# Patient Record
Sex: Female | Born: 1960
Health system: Southern US, Community
[De-identification: ages and names within clinical notes are randomized; demographics above are authoritative.]

## PROBLEM LIST (undated history)

## (undated) DIAGNOSIS — M1990 Unspecified osteoarthritis, unspecified site: Secondary | ICD-10-CM

## (undated) DIAGNOSIS — Z310 Encounter for reversal of previous sterilization: Secondary | ICD-10-CM

## (undated) DIAGNOSIS — M722 Plantar fascial fibromatosis: Secondary | ICD-10-CM

## (undated) DIAGNOSIS — G4730 Sleep apnea, unspecified: Secondary | ICD-10-CM

## (undated) DIAGNOSIS — K219 Gastro-esophageal reflux disease without esophagitis: Secondary | ICD-10-CM

## (undated) DIAGNOSIS — M797 Fibromyalgia: Secondary | ICD-10-CM

## (undated) DIAGNOSIS — R0602 Shortness of breath: Secondary | ICD-10-CM

## (undated) DIAGNOSIS — M7501 Adhesive capsulitis of right shoulder: Secondary | ICD-10-CM

## (undated) DIAGNOSIS — S83206A Unspecified tear of unspecified meniscus, current injury, right knee, initial encounter: Secondary | ICD-10-CM

## (undated) DIAGNOSIS — S83207A Unspecified tear of unspecified meniscus, current injury, left knee, initial encounter: Secondary | ICD-10-CM

## (undated) HISTORY — PX: CHOLECYSTECTOMY: SHX55

## (undated) HISTORY — DX: Unspecified tear of unspecified meniscus, current injury, right knee, initial encounter: S83.206A

## (undated) HISTORY — PX: TUBAL LIGATION: SHX77

## (undated) HISTORY — PX: FOOT TENDON SURGERY: SHX958

## (undated) HISTORY — DX: Adhesive capsulitis of right shoulder: M75.01

## (undated) HISTORY — DX: Plantar fascial fibromatosis: M72.2

## (undated) HISTORY — DX: Unspecified tear of unspecified meniscus, current injury, left knee, initial encounter: S83.207A

## (undated) HISTORY — PX: MENISCUS REPAIR: SHX5179

## (undated) HISTORY — PX: ROTATOR CUFF REPAIR: SHX139

## (undated) HISTORY — PX: TONSILLECTOMY: SUR1361

## (undated) HISTORY — DX: Encounter for reversal of previous sterilization: Z31.0

## (undated) HISTORY — PX: CARPAL TUNNEL RELEASE: SHX101

---

## 1998-07-16 DIAGNOSIS — Z310 Encounter for reversal of previous sterilization: Secondary | ICD-10-CM

## 1998-07-16 HISTORY — DX: Encounter for reversal of previous sterilization: Z31.0

## 1999-02-22 ENCOUNTER — Ambulatory Visit (HOSPITAL_BASED_OUTPATIENT_CLINIC_OR_DEPARTMENT_OTHER): Admission: RE | Admit: 1999-02-22 | Discharge: 1999-02-22 | Payer: Self-pay | Admitting: Orthopedic Surgery

## 2001-09-03 ENCOUNTER — Emergency Department (HOSPITAL_COMMUNITY): Admission: EM | Admit: 2001-09-03 | Discharge: 2001-09-03 | Payer: Self-pay | Admitting: Emergency Medicine

## 2005-02-21 ENCOUNTER — Ambulatory Visit: Payer: Self-pay | Admitting: Internal Medicine

## 2005-02-24 ENCOUNTER — Ambulatory Visit (HOSPITAL_COMMUNITY): Admission: RE | Admit: 2005-02-24 | Discharge: 2005-02-24 | Payer: Self-pay | Admitting: Internal Medicine

## 2006-08-14 ENCOUNTER — Ambulatory Visit (HOSPITAL_COMMUNITY): Admission: RE | Admit: 2006-08-14 | Discharge: 2006-08-14 | Payer: Self-pay | Admitting: Hospitalist

## 2006-08-14 ENCOUNTER — Encounter (INDEPENDENT_AMBULATORY_CARE_PROVIDER_SITE_OTHER): Payer: Self-pay | Admitting: Hospitalist

## 2006-08-16 ENCOUNTER — Encounter: Payer: Self-pay | Admitting: Internal Medicine

## 2006-08-16 ENCOUNTER — Ambulatory Visit (HOSPITAL_COMMUNITY): Admission: RE | Admit: 2006-08-16 | Discharge: 2006-08-16 | Payer: Self-pay | Admitting: Internal Medicine

## 2006-08-16 ENCOUNTER — Ambulatory Visit: Payer: Self-pay | Admitting: Internal Medicine

## 2006-09-03 ENCOUNTER — Ambulatory Visit (HOSPITAL_COMMUNITY): Admission: RE | Admit: 2006-09-03 | Discharge: 2006-09-03 | Payer: Self-pay | Admitting: Internal Medicine

## 2006-09-04 DIAGNOSIS — Z9089 Acquired absence of other organs: Secondary | ICD-10-CM | POA: Insufficient documentation

## 2006-09-04 DIAGNOSIS — M26609 Unspecified temporomandibular joint disorder, unspecified side: Secondary | ICD-10-CM | POA: Insufficient documentation

## 2006-09-04 DIAGNOSIS — IMO0002 Reserved for concepts with insufficient information to code with codable children: Secondary | ICD-10-CM | POA: Insufficient documentation

## 2006-09-04 DIAGNOSIS — M7500 Adhesive capsulitis of unspecified shoulder: Secondary | ICD-10-CM | POA: Insufficient documentation

## 2006-09-06 ENCOUNTER — Ambulatory Visit: Payer: Self-pay | Admitting: Internal Medicine

## 2006-09-06 DIAGNOSIS — N63 Unspecified lump in unspecified breast: Secondary | ICD-10-CM | POA: Insufficient documentation

## 2006-09-06 DIAGNOSIS — M722 Plantar fascial fibromatosis: Secondary | ICD-10-CM | POA: Insufficient documentation

## 2006-09-09 DIAGNOSIS — G4733 Obstructive sleep apnea (adult) (pediatric): Secondary | ICD-10-CM | POA: Insufficient documentation

## 2006-09-20 ENCOUNTER — Encounter: Admission: RE | Admit: 2006-09-20 | Discharge: 2006-09-20 | Payer: Self-pay | Admitting: Internal Medicine

## 2006-09-20 ENCOUNTER — Encounter (INDEPENDENT_AMBULATORY_CARE_PROVIDER_SITE_OTHER): Payer: Self-pay | Admitting: Internal Medicine

## 2006-10-16 ENCOUNTER — Ambulatory Visit: Payer: Self-pay | Admitting: Hospitalist

## 2006-10-29 ENCOUNTER — Ambulatory Visit: Payer: Self-pay | Admitting: Hospitalist

## 2006-11-12 ENCOUNTER — Telehealth: Payer: Self-pay | Admitting: *Deleted

## 2006-11-13 ENCOUNTER — Ambulatory Visit: Payer: Self-pay | Admitting: *Deleted

## 2006-11-13 ENCOUNTER — Encounter: Payer: Self-pay | Admitting: Licensed Clinical Social Worker

## 2006-12-05 ENCOUNTER — Encounter (INDEPENDENT_AMBULATORY_CARE_PROVIDER_SITE_OTHER): Payer: Self-pay | Admitting: Internal Medicine

## 2006-12-05 ENCOUNTER — Ambulatory Visit (HOSPITAL_BASED_OUTPATIENT_CLINIC_OR_DEPARTMENT_OTHER): Admission: RE | Admit: 2006-12-05 | Discharge: 2006-12-05 | Payer: Self-pay | Admitting: Internal Medicine

## 2006-12-09 ENCOUNTER — Ambulatory Visit: Payer: Self-pay | Admitting: Internal Medicine

## 2007-03-04 ENCOUNTER — Ambulatory Visit: Payer: Self-pay | Admitting: Internal Medicine

## 2007-03-06 ENCOUNTER — Ambulatory Visit (HOSPITAL_COMMUNITY): Admission: RE | Admit: 2007-03-06 | Discharge: 2007-03-06 | Payer: Self-pay | Admitting: *Deleted

## 2007-03-19 ENCOUNTER — Encounter: Admission: RE | Admit: 2007-03-19 | Discharge: 2007-04-24 | Payer: Self-pay | Admitting: *Deleted

## 2007-03-26 ENCOUNTER — Ambulatory Visit: Payer: Self-pay | Admitting: Internal Medicine

## 2007-03-26 ENCOUNTER — Encounter (INDEPENDENT_AMBULATORY_CARE_PROVIDER_SITE_OTHER): Payer: Self-pay | Admitting: *Deleted

## 2007-04-24 ENCOUNTER — Encounter: Payer: Self-pay | Admitting: Internal Medicine

## 2008-05-08 ENCOUNTER — Emergency Department (HOSPITAL_COMMUNITY): Admission: EM | Admit: 2008-05-08 | Discharge: 2008-05-08 | Payer: Self-pay | Admitting: Emergency Medicine

## 2008-11-07 IMAGING — CR DG WRIST COMPLETE 3+V*L*
4 series · 4 of 4 positions shown · non-contrast
Comparison: none

CLINICAL DATA: 45 year-old with left wrist pain.
 LEFT WRIST ? 4 VIEW:
 There is no evidence of fracture or dislocation.  There is no evidence of arthropathy or other focal bone abnormality.  Soft tissues are unremarkable.

[x wrist pa left]
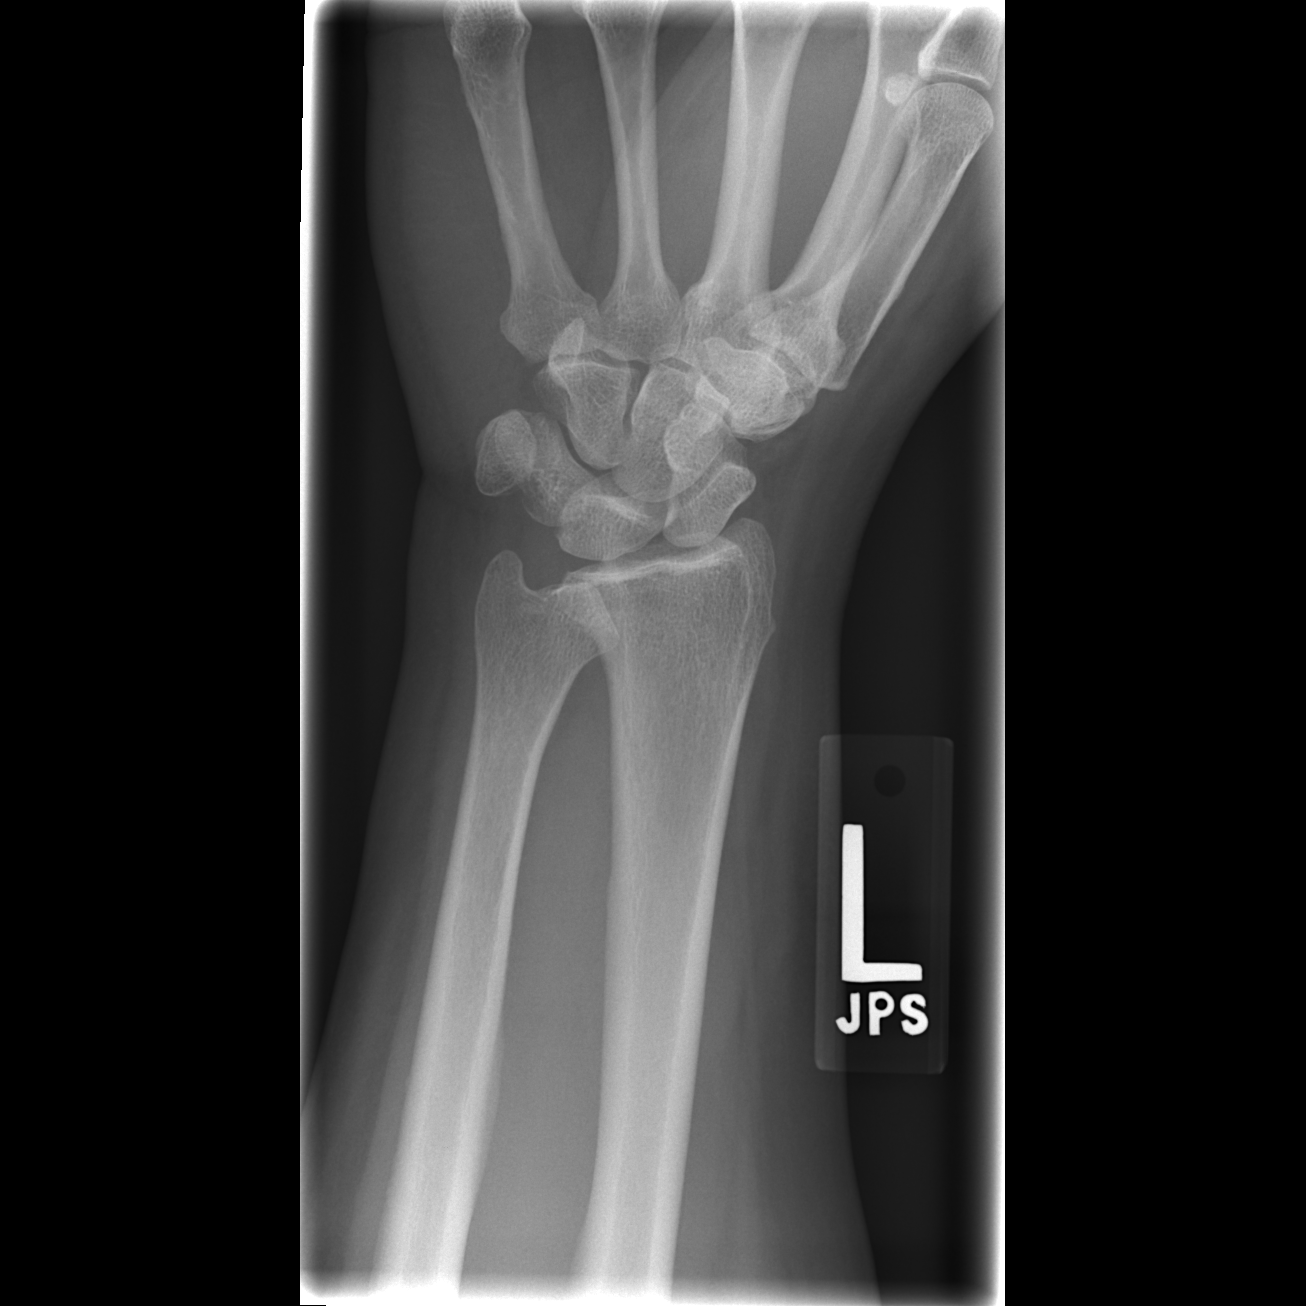

[x wrist obl left]
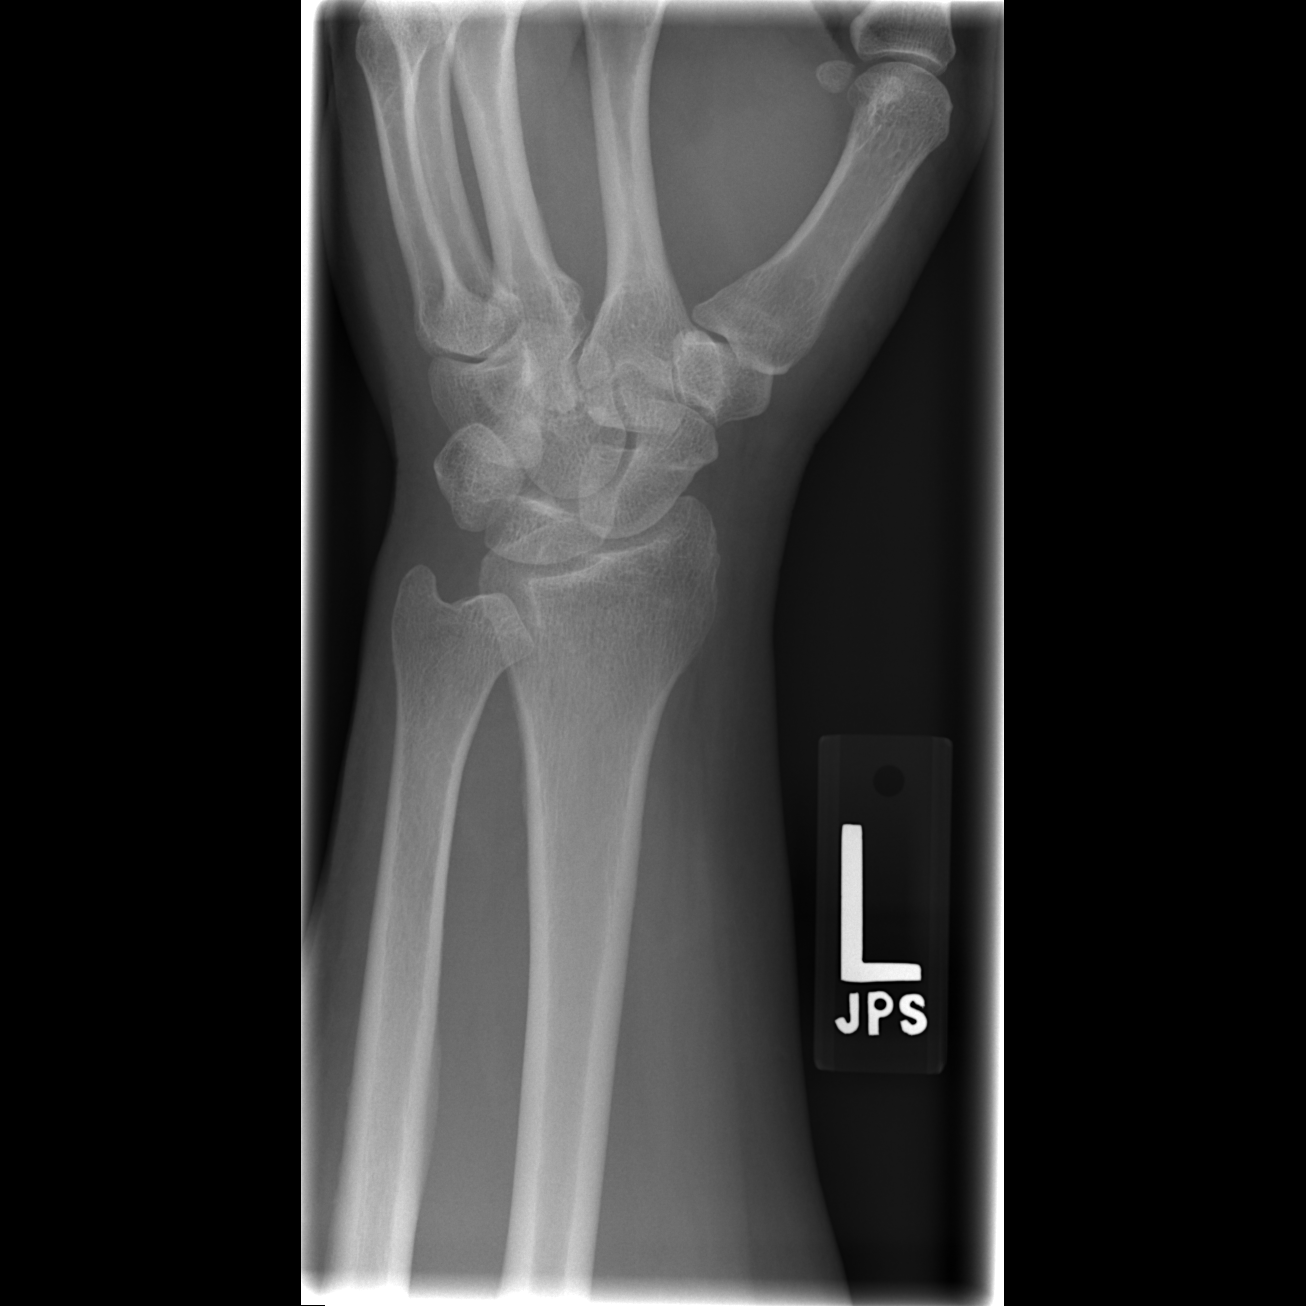

[x wrist lat left]
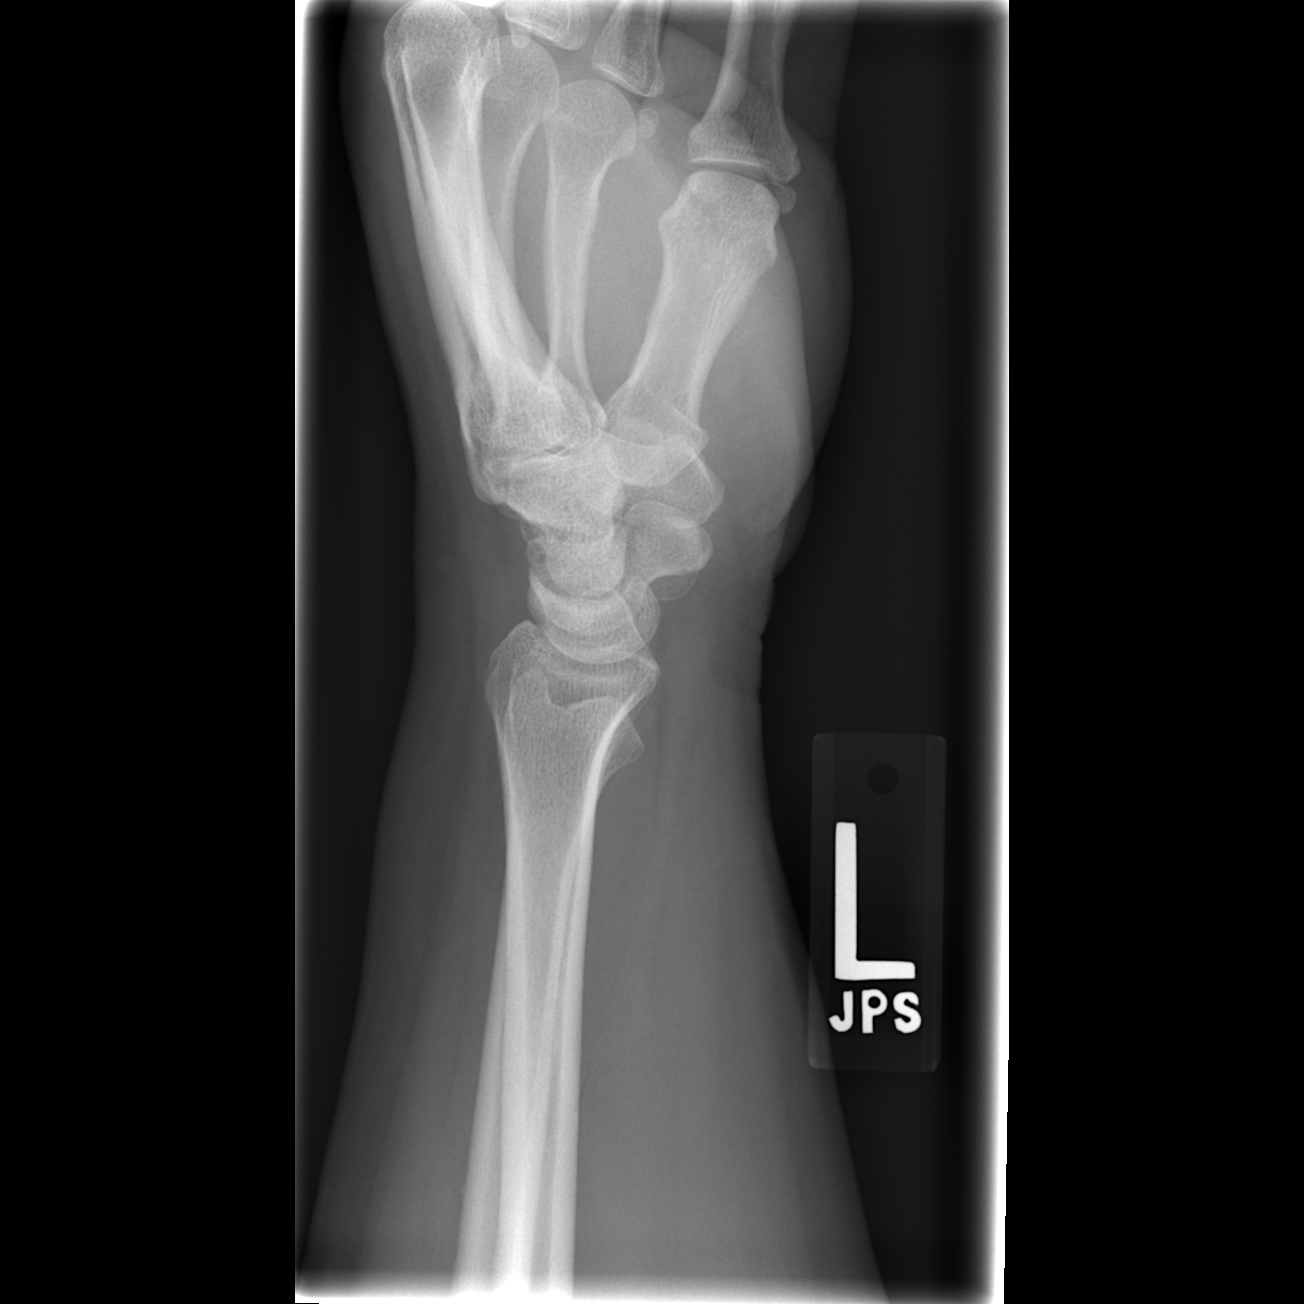

[x navicular]
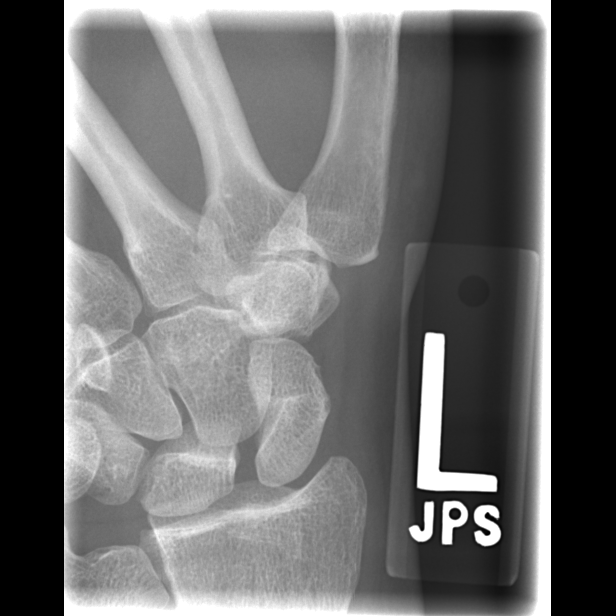

[4 of 4 positions shown; findings below may reference images not displayed]

IMPRESSION: Negative.

## 2009-08-29 ENCOUNTER — Ambulatory Visit: Payer: Self-pay | Admitting: Internal Medicine

## 2009-08-31 ENCOUNTER — Ambulatory Visit: Payer: Self-pay | Admitting: Sports Medicine

## 2009-08-31 DIAGNOSIS — M79609 Pain in unspecified limb: Secondary | ICD-10-CM | POA: Insufficient documentation

## 2009-09-02 ENCOUNTER — Ambulatory Visit: Payer: Self-pay | Admitting: Internal Medicine

## 2009-09-02 LAB — CONVERTED CEMR LAB
ALT: 30 units/L (ref 0–35)
AST: 27 units/L (ref 0–37)
Albumin: 3.9 g/dL (ref 3.5–5.2)
Alkaline Phosphatase: 93 units/L (ref 39–117)
BUN: 9 mg/dL (ref 6–23)
CO2: 22 meq/L (ref 19–32)
Calcium: 8.8 mg/dL (ref 8.4–10.5)
Chloride: 103 meq/L (ref 96–112)
Cholesterol: 185 mg/dL (ref 0–200)
Creatinine, Ser: 0.57 mg/dL (ref 0.40–1.20)
Glucose, Bld: 188 mg/dL — ABNORMAL HIGH (ref 70–99)
HDL: 47 mg/dL (ref >39)
LDL Cholesterol: 106 mg/dL — ABNORMAL HIGH (ref 0–99)
Potassium: 4 meq/L (ref 3.5–5.3)
Sodium: 136 meq/L (ref 135–145)
TSH: 0.985 microintl units/mL (ref 0.350–4.5)
Total Bilirubin: 0.6 mg/dL (ref 0.3–1.2)
Total CHOL/HDL Ratio: 3.9
Total Protein: 6.6 g/dL (ref 6.0–8.3)
Triglycerides: 161 mg/dL — ABNORMAL HIGH (ref <150)
VLDL: 32 mg/dL (ref 0–40)

## 2009-09-05 ENCOUNTER — Ambulatory Visit (HOSPITAL_COMMUNITY): Admission: RE | Admit: 2009-09-05 | Discharge: 2009-09-05 | Payer: Self-pay | Admitting: Sports Medicine

## 2009-09-06 ENCOUNTER — Ambulatory Visit (HOSPITAL_COMMUNITY): Admission: RE | Admit: 2009-09-06 | Discharge: 2009-09-06 | Payer: Self-pay | Admitting: Internal Medicine

## 2009-09-06 LAB — HM MAMMOGRAPHY: HM Mammogram: NEGATIVE

## 2009-09-12 ENCOUNTER — Telehealth: Payer: Self-pay | Admitting: *Deleted

## 2009-09-21 ENCOUNTER — Ambulatory Visit: Payer: Self-pay | Admitting: Sports Medicine

## 2009-10-11 ENCOUNTER — Ambulatory Visit: Payer: Self-pay | Admitting: Infectious Diseases

## 2009-10-19 ENCOUNTER — Ambulatory Visit: Payer: Self-pay | Admitting: Sports Medicine

## 2009-10-19 DIAGNOSIS — M65849 Other synovitis and tenosynovitis, unspecified hand: Secondary | ICD-10-CM

## 2009-10-19 DIAGNOSIS — M65839 Other synovitis and tenosynovitis, unspecified forearm: Secondary | ICD-10-CM | POA: Insufficient documentation

## 2009-10-19 DIAGNOSIS — M720 Palmar fascial fibromatosis [Dupuytren]: Secondary | ICD-10-CM | POA: Insufficient documentation

## 2009-11-30 ENCOUNTER — Ambulatory Visit: Payer: Self-pay | Admitting: Sports Medicine

## 2010-01-04 ENCOUNTER — Ambulatory Visit: Payer: Self-pay | Admitting: Sports Medicine

## 2010-07-24 ENCOUNTER — Ambulatory Visit: Admit: 2010-07-24 | Payer: Self-pay

## 2010-07-26 ENCOUNTER — Ambulatory Visit: Admission: RE | Admit: 2010-07-26 | Discharge: 2010-07-26 | Payer: Self-pay | Source: Home / Self Care

## 2010-07-26 DIAGNOSIS — E119 Type 2 diabetes mellitus without complications: Secondary | ICD-10-CM | POA: Insufficient documentation

## 2010-07-26 DIAGNOSIS — E11319 Type 2 diabetes mellitus with unspecified diabetic retinopathy without macular edema: Secondary | ICD-10-CM | POA: Insufficient documentation

## 2010-07-26 DIAGNOSIS — Z794 Long term (current) use of insulin: Secondary | ICD-10-CM | POA: Insufficient documentation

## 2010-07-26 LAB — CONVERTED CEMR LAB: Hgb A1c MFr Bld: 9 %

## 2010-07-27 DIAGNOSIS — E785 Hyperlipidemia, unspecified: Secondary | ICD-10-CM | POA: Insufficient documentation

## 2010-07-27 LAB — CONVERTED CEMR LAB
ALT: 28 units/L (ref 0–35)
AST: 27 units/L (ref 0–37)
Albumin: 4.1 g/dL (ref 3.5–5.2)
Alkaline Phosphatase: 102 units/L (ref 39–117)
BUN: 9 mg/dL (ref 6–23)
CO2: 26 meq/L (ref 19–32)
Calcium: 9.5 mg/dL (ref 8.4–10.5)
Chloride: 102 meq/L (ref 96–112)
Cholesterol: 238 mg/dL — ABNORMAL HIGH (ref 0–200)
Creatinine, Ser: 0.63 mg/dL (ref 0.40–1.20)
Glucose, Bld: 235 mg/dL — ABNORMAL HIGH (ref 70–99)
HDL: 57 mg/dL (ref >39)
LDL Cholesterol: 152 mg/dL — ABNORMAL HIGH (ref 0–99)
Potassium: 4.3 meq/L (ref 3.5–5.3)
Sodium: 137 meq/L (ref 135–145)
Total Bilirubin: 0.5 mg/dL (ref 0.3–1.2)
Total CHOL/HDL Ratio: 4.2
Total Protein: 6.9 g/dL (ref 6.0–8.3)
Triglycerides: 146 mg/dL (ref <150)
VLDL: 29 mg/dL (ref 0–40)

## 2010-08-06 ENCOUNTER — Encounter: Payer: Self-pay | Admitting: *Deleted

## 2010-08-17 NOTE — Assessment & Plan Note (Signed)
Summary: FEET/ SB.   Vital Signs:  Patient Profile:   50 Years Old Female Height:     62 inches (157.48 cm) Weight:      230.6 pounds (104.82 kg) BMI:     42.33 Temp:     97.0 degrees F (36.11 degrees C) oral Pulse rate:   74 / minute BP sitting:   127 / 86  (right arm)  Pt. in pain?   yes    Location:   right foot    Intensity:   8  Vitals Entered By: Marisa Kidney Ditzler RN (March 04, 2007 3:51 PM)              Is Patient Diabetic? No Nutritional Status BMI of > 30 = obese Nutritional Status Detail ok  Have you ever been in a relationship where you felt threatened, hurt or afraid?denies   Does patient need assistance? Functional Status Self care Ambulation Normal   PCP:  Ramiro Harvest, MD  Chief Complaint:  Problems with right foot again and left hip. Discuss sleep study. LMP 01/14/07- hot flashes  and night sweats and not sleeping at night..  History of Present Illness: Pt is a 50 yo obese woman with a hx of plantar fasciitis, capsulitis and recently dx'd OSA who comes to the opc because she's having R foot pain. Pt never had f/u on PSG and wants to know what the report showed. Has a heel spur and plantar fasciitis and [name] had mentioned he might refer her elsewhere. Her pain started 6-12 months ago and came back 5 days ago. Still does exercises (tarsal stretching). Has been taking about 16 tabs of 200 mg ibuprofen and has been having an upset stomach because of that. Constantly throbbing foot. Limping, can't tolerate walking for long periods anymore. Feels like her foot is becoming swollen again. Stabbing pain when she gets off the bed.  Has no choice but to help her husband; he's wheelchair bound. [name] had suggested she stay off her foot for a year.  Pt has lost about 10 lbs since her dx.  Pt bought otc inserts that helped some but explains that they tend to give her L lumbar pain. Had meniscectomy in her L knee years ago and feels that why she's having a hard  time correcting her gait.  Current Allergies (reviewed today): ! FLOXIN OTIC  Past Medical History:    Right adhesive capsulitis with underlying tendinopathy rotator cuff    History of tuboplasty 4-6 years ago- 2000    Left meniscus tear x2 status post surgery.    Right meniscus tear x1 status post surgery.    TMJ status post surgery.    Status post cholecystectomy x14 years ago in [place]    R plantar fasciitis   Social History:    Occupation: Works Librarian, academic    Married. Husband disabled and wheelchair bound.   Risk Factors: Tobacco use:  never Passive smoke exposure:  no Drug use:  no HIV high-risk behavior:  no Alcohol use:  no  Family History Risk Factors:    Family History of MI in females < 44 years old:  no    Family History of MI in males < 35 years old:  no   Review of Systems      See HPI  GI      Denies abdominal pain, bloody stools, dark tarry stools, nausea, and vomiting.   Physical Exam  General:     alert, well-developed, well-nourished, and  well-hydrated.  Obese pleasant Latin woman. Head:     atraumatic.   Eyes:     vision grossly intact, pupils equal, pupils round, and pupils reactive to light.   Mouth:     OP clear, MMM. Neck:     Supple, no lymphadnp/tm Lungs:     CTAB with good air mvt. Heart:     RRR, no m/r/g Pulses:     2+ pedal pulses bilaterally. Extremities:     Trace edema up to both knees. No cyanosis or clubbing. R foot tender at plantar fascia insertion. Palpable indurated and thickened plantar fascia bilaterally but most significantly on R side. Neurologic:     alert & oriented X3, cranial nerves II-XII intact, strength normal in all extremities, sensation intact to light touch, and DTRs symmetrical and normal.  Antalgic gait.    Impression & Recommendations:  Problem # 1:  SLEEP APNEA, OBSTRUCTIVE (ICD-327.23) Pt had a PSG done which revealed moderate OSA. They were unable to titrate her on CPAP because she is  unable to sleep on her sides (only sleeps on her stomach). Explained situation to patient. Wanted to give it another try; she'll do her best to sleep on her side. On the PSG report, it was also mentioned how "other" therapies could also be addressed during an eventual f/u. Pt interested in meeting with pulmonologist again. Otherwise, pt still symptomatic in terms of waking up frequently.  Orders: Pulmonary Referral (Pulmonary)   Problem # 2:  PLANTAR FASCIITIS, RIGHT (ICD-728.71) I do agree with the dx of plantar fasciitis but sx rarely persist more than 12 months. I have printed all the different exercises patient can do from the [employer] chapter on the topic. I have also provided her with a description on how to perform foot taping. Since she has apparently failed antiinflammatory tx, I will get an MRI of her foot to confirm the dx and further evaluate the palpable thickening overlying the fascia. I will also refer her to PT to make sure that she at least does her exercises a few times a week. I will also try an orthopedics referral given that she hasn't responded that well to conservative tx. Pt not willing to other steroid injections today because it hurt her so much last time she had it done.  Orders: Orthopedic Referral (Ortho) Physical Therapy Referral (PT) MRI (MRI)  Her updated medication list for this problem includes:    Ibuprofen 800 Mg Tabs (Ibuprofen) .Marland Kitchen... Take 1 tablet by mouth three times a day   Complete Medication List: 1)  Darvocet-n 100 Tabs (Propoxyphene n-apap tabs) .Marland Kitchen.. 1 tablet every 4 hours as needed for pain 2)  Ibuprofen 800 Mg Tabs (Ibuprofen) .... Take 1 tablet by mouth three times a day   Patient Instructions: 1)  We have referred you to orthopedics; they will call you to set up an apptmt. 2)  We have referred you back to a pulmonologist to see what can be done about your sleep apnea. 3)  Get inserts for your shoes. 4)  Do the foot exercises I showed you  everyday.    Prescriptions: DARVOCET-N 100  TABS (PROPOXYPHENE N-APAP TABS) 1 tablet every 4 hours as needed for pain  #120 x 1   Entered and Authorized by:   Olene Craven MD   Signed by:   Olene Craven MD on 03/04/2007   Method used:   Print then Give to Patient   RxID:   [id number] IBUPROFEN 800 MG  TABS (IBUPROFEN)  Take 1 tablet by mouth three times a day  #90 x 1   Entered and Authorized by:   Olene Craven MD   Signed by:   Olene Craven MD on 03/04/2007   Method used:   Print then Give to Patient   RxID:   [id number]

## 2010-08-17 NOTE — Assessment & Plan Note (Signed)
Summary: FU WRIST PAIN/VS   Vital Signs:  Patient Profile:   50 Years Old Female Weight:      240.9 pounds Temp:     97.7 degrees F oral Pulse rate:   67 / minute Resp:     20 per minute BP sitting:   126 / 82  (right arm)  Pt. in pain?   yes    Location:   L wrist    Intensity:   6    Type:       aching  Vitals Entered By: Ballard Russell RN (August 16, 2006 2:38 PM)              Is Patient Diabetic? No Nutritional Status Obese  Have you ever been in a relationship where you felt threatened, hurt or afraid?No   Does patient need assistance? Functional Status Self care Ambulation Normal      Chief Complaint:  Folow up pain L wrist.  History of Present Illness: [name]  comes today to our clinic complaining of left wrist pain. 3 weeks ago a hammer fell on her wrist and since then she has been havig pain on and off in that area. She had an X-ray yesterday that did not show fracture. She also would like to get her PAP smear done and be schedule for mammogram. Finally she has had a painful knot on her plantar surface of right foot for months that doesn't go away.    Prior Medications:    Social History:    Occupation: Works Banker wall    Married   Risk Factors:  Tobacco use:  never   Review of Systems  The patient denies fever and chills.     Physical Exam  General:     alert and overweight-appearing.   Head:     normocephalic and atraumatic.   Breasts:     questionable L breast mass free from deep and superficial tissues. No adenopathy. Right breast normal Lungs:     normal respiratory effort and normal breath sounds.   Heart:     normal rate, regular rhythm, no murmur, no gallop, and no rub.   Genitalia:     Normal introitus for age, no external lesions, no vaginal discharge, mucosa pink and moist, no vaginal or cervical lesions, no vaginal atrophy, no friaility or hemorrhage, normal uterus size and position, no adnexal masses or  tenderness Msk:     There is normal left wrist ROM and minimal tenderness over radial aspect. No edema and no inflammtory signs.  Right foot indeed has tenderness along the medial aspect of plantar surface where a bulging is palpated that could represent hypertrophic fascia or calcified tendon.    Impression & Recommendations:  Problem # 1:  CONTUSION, WRIST (ICD-923.21) She is 3 weeks off the incident. X-ray was negative for fracture.  Plan: Ibuprofen 600 mg. by mouth three times a day for 7 days         Come back to clinic if pain persists after 2 weeks.  Problem # 2:  FOOT PAIN, RIGHT (ICD-729.5) At this point it seems to me this is plantar fasciitis with hypertrophic changes or calcified tendon.   Plan: X-ray, Ibuprofen, Gel pads, exercises as explained to patient.          F/U 2-3 weeks. Orders: Diagnostic X-Ray/Fluoroscopy (Diagnostic X-Ray/Flu)   Problem # 3:  PREVENTIVE HEALTH CARE (ICD-V70.0) I did pelvic exam and PAP. Orders: Mammogram (Mammogram) T-Pap Smear, Thin Prep (  U9811)   Medications Added to Medication List This Visit: 1)  Ibuprofen 200 Mg Tabs (Ibuprofen) .... Take 3 tablets by mouth three times a day for 7 days   Patient Instructions: 1)  Please schedule a follow-up appointment in 2-3 weeks.

## 2010-08-17 NOTE — Assessment & Plan Note (Signed)
Summary: F/U,MC   Vital Signs:  Patient profile:   50 year old female BP sitting:   113 / 74  Vitals Entered By: Lillia Pauls CMA (October 19, 2009 3:04 PM)  Primary Provider:  Mariea Stable MD   History of Present Illness: Bilateral plantar pain 75% improved on modified comforthotics. Flared 5 days ago 2/2 increased walking required to transport family members to medical appointments. Pain decreasing in the interim Took old opioid tablets for pain relief. No adverse medication effects. Typically pain caused by ambulation and relieved by rest. No paresthesias.  Notes intermittent pain on extensor aspect of right index finger. Some occasional swelling. No paresthesias.  No trauma or pevious procedures.   Notes intermittent pain and catching along mid-distral palmar aspect of right hand. No finger triggering. Occasional palmar pain. No palmar swelling. No paresthesias. No previous trauma or procedures.   Borderilne DM. No thyroid disease.  Allergies: 1)  ! Floxin Otic PMH-FH-SH reviewed for relevance  Physical Exam  General:  Well-developed,well-nourished,in no acute distress; alert,appropriate and cooperative throughout examination Msk:  Decreased size and ttp of bilateral plantar fibromatoses. Otherwise unchanged exam.  Exmaination of right hand revealed extensor indicis pain on palpation, more pronounced on resisted index extension. No swelling/discoloration. Function intact.  Right palmar exam revealed pitting about the 3rd/4th A1 pulleys (c/w Depyutren's Disease); with mild dyscomfort on palpation. No triggering. No skin discoloration or swelling.  Full ROM/strength wrt right wrist/hand/fingers.  Normal nv examination throughout UEs.     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 States she is significantly limited by finance. Thus  she opts to forego surgical consult at this point. Declined an aspiration/injection procedure today.  - Increased the depth of the doughnut depression on current felt inserts during this encounter. - Refilled diclofenac. - Start tramadol with sedation precautions. - DC opioid. Discard opioid rx given it is  ~49 yrs old.  - RTC in 6 weeks.  Her updated medication list for this problem includes:    Diclofenac Sodium 75 Mg Tbec (Diclofenac sodium) .Marland Kitchen... 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  - Will start with hand/finger stretches (demonstrated to patient) under warm water daily. - Diclofenac refilled. - RTC in 6 wks. Will consider alternative interventions based on clinical picture and patient desires.  Problem # 3:  OTHER TENOSYNOVITIS OF HAND AND WRIST (ICD-727.05) Extensor Tendonitis  - Diclofenac refilled. - Daily wrist/hand/finger stretching and strengthening exercises (demonstrated to patient) under warm water as tolerated. - RTC in 4 weeks.  Complete Medication List: 1)  Diclofenac Sodium 75 Mg Tbec (Diclofenac sodium) .Marland Kitchen.. 1 tab by mouth with food bid 2)  Tramadol Hcl 50 Mg Tabs (Tramadol hcl) .Marland Kitchen.. 1-2 tabs by mouth qhs as needed severe pain Prescriptions: DICLOFENAC SODIUM 75 MG TBEC (DICLOFENAC SODIUM) 1 tab by mouth with food BID  #60 x 0   Entered and Authorized by:   Valarie Merino MD   Signed by:   Valarie Merino MD on 10/19/2009   Method used:   Print then Give to Patient   RxID:   [id number] DICLOFENAC SODIUM 75 MG TBEC (DICLOFENAC SODIUM) 1 tab by mouth with food BID  #60 x 0   Entered and Authorized by:   Valarie Merino MD   Signed by:   Valarie Merino MD  on 10/19/2009   Method used:   Print then Give to Patient   RxID:   [id number] TRAMADOL HCL 50 MG TABS (TRAMADOL HCL) 1-2 tabs by mouth qHS as needed severe pain  #60 x 0    Entered and Authorized by:   Valarie Merino MD   Signed by:   Valarie Merino MD on 10/19/2009   Method used:   Print then Give to Patient   RxID:   [id number] TRAMADOL HCL 50 MG TABS (TRAMADOL HCL) 1-2 tabs by mouth qHS as needed severe pain  #60 x 0   Entered and Authorized by:   Valarie Merino MD   Signed by:   Valarie Merino MD on 10/19/2009   Method used:   Print then Give to Patient   RxID:   [id number]

## 2010-08-17 NOTE — Assessment & Plan Note (Signed)
Summary: F/U,MC   Vital Signs:  Patient profile:   50 year old female BP sitting:   130 / 85  Vitals Entered By: Lillia Pauls CMA (Nov 30, 2009 9:23 AM)  Primary Provider:  Mariea Stable MD   History of Present Illness: DOI: N/A  Reports to address left plantar fibromatosis. Problem started several months ago. Pain and size of nodule initially decreased but patient has noted increased nodular size and pain in the setting of increased ambulatory activity (Caring for disabled family members) since her LOV. Pain worst on  palpation and ambulation. Pain decreased on  rest. Ran out of tramadol and mobic which were helping to control her pain. Other ROS  negative for paresthesias. Has not seen a surgeon 2/2 limited finances and lack of insure coverage. Last had fenestration and corticosteroid injection more than one year ago; minimal relief. She has some reservations about the technique applied during this past procedure. Would like to try repeat fenestration and corticosteroid injection today.   Routine Activities: Cares for disabled family members daily. Her son was recently hospicalized 2/2 UTI.   Allergies: 1)  ! Floxin Otic  Physical Exam  General:  Well-developed,well-nourished,in no acute distress; alert,appropriate and cooperative throughout examination Msk:  L FOOT: Very tender, non-erythematous, mildly indurated, dry nodule at mid arch of increased size since her LOV. No increased warmth. No signs of infection. Normal nv exam. FROM thoughout ankle/foot/toes.   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 Ethyl chloride was used to anesthetize the skin. 1 ml of lidocaine w/o epinephrine was injected into the prepped field. Appropriate analgesia was achieved. Fenestrations were introduced  into the plantar nodule w/o complications or difficulty. Then 1ml of kenalog 10mg /ml was injected into the nodule without complications or difficulty. The patient tolerated this procedure well. Normal post-procedure neurovascular examination.  - Mobic and tramadol refilled. - Prescription for plastazote inserts. - Immediately seek MD attention for fever, worsening symptoms, skin discoloration, or any other concerns. Otherwise RTC in 4 weeks.  Her updated medication list for this problem includes:    Diclofenac Sodium 75 Mg Tbec (Diclofenac sodium) .Marland Kitchen... 1 tab by mouth with food bid  Orders: Aspirate/Inject Ganglion Cyst (65784) Kenalog 10 mg inj (J3301)  Complete Medication List: 1)  Diclofenac Sodium 75 Mg Tbec (Diclofenac sodium) .Marland Kitchen.. 1 tab by mouth with food bid 2)  Tramadol Hcl 50 Mg Tabs (Tramadol hcl) .Marland Kitchen.. 1-2 tabs by mouth qhs as needed severe pain 3)  Soft Plastazote Shoe Inserts  .... Dx: plantar fibromatosis (symptomatic).  please dispense two pairs of inserts for use in shoes. 4)  Lortab 5-500 Mg Tabs (Hydrocodone-acetaminophen) .... Take one tab qhs Prescriptions: LORTAB 5-500 MG TABS (HYDROCODONE-ACETAMINOPHEN) take one tab qhs  #5 x 0   Entered by:   Lillia Pauls CMA   Authorized by:   Valarie Merino MD   Signed by:   Lillia Pauls CMA on 11/30/2009   Method used:   Telephoned to ...       [facility]       [address]       [place], [place]  [address]       Ph: [phone]       Fax: [phone]   RxID:   [id number] TRAMADOL HCL 50  MG TABS (TRAMADOL HCL) 1-2 tabs by mouth qHS as needed severe pain  #60 x 1   Entered and Authorized by:   Valarie Merino MD   Signed by:   Valarie Merino MD on 11/30/2009   Method used:   Print then Give to Patient   RxID:   [id number] DICLOFENAC SODIUM 75 MG TBEC (DICLOFENAC SODIUM) 1 tab by mouth with food BID  #60 x 1   Entered and Authorized by:   Valarie Merino MD   Signed by:    Valarie Merino MD on 11/30/2009   Method used:   Print then Give to Patient   RxID:   [id number] TRAMADOL HCL 50 MG TABS (TRAMADOL HCL) 1-2 tabs by mouth qHS as needed severe pain  #60 x 0   Entered and Authorized by:   Valarie Merino MD   Signed by:   Valarie Merino MD on 11/30/2009   Method used:   Print then Give to Patient   RxID:   [id number] DICLOFENAC SODIUM 75 MG TBEC (DICLOFENAC SODIUM) 1 tab by mouth with food BID  #60 x 0   Entered and Authorized by:   Valarie Merino MD   Signed by:   Valarie Merino MD on 11/30/2009   Method used:   Print then Give to Patient   RxID:   [id number] SOFT PLASTAZOTE SHOE INSERTS Dx: Plantar Fibromatosis (Symptomatic).  Please dispense two pairs of inserts for use in shoes.  #2 pairs x 0   Entered and Authorized by:   Valarie Merino MD   Signed by:   Valarie Merino MD on 11/30/2009   Method used:   Print then Give to Patient   RxID:   [id number] SOFT PLASTAZOTE SHOE INSERTS Dx: Plantar Fibromatosis (Symptomatic).  Please dispense two pairs of inserts for use in shoes.  #2 pairs x 0   Entered and Authorized by:   Valarie Merino MD   Signed by:   Valarie Merino MD on 11/30/2009   Method used:   Print then Give to Patient   RxID:   [id number]

## 2010-08-17 NOTE — Assessment & Plan Note (Signed)
Summary: EST-CK/FU/MEDS/CFB   Vital Signs:  Patient profile:   50 year old female Height:      62 inches (157.48 cm) Weight:      229.5 pounds (104.32 kg) BMI:     42.13 Temp:     97.2 degrees F (36.22 degrees C) oral Pulse rate:   78 / minute BP sitting:   116 / 81  (right arm) Cuff size:   regular  Vitals Entered By: Cynda Familia Duncan Dull) (October 11, 2009 4:04 PM) CC: pt here for pap but has noticed some "spotting", also c/o pain/swelling in right hand, some tingling and numbness on occ Is Patient Diabetic? No Pain Assessment Patient in pain? yes     Location: right hand Intensity: 10 Type: sharp Onset of pain  Intermittent x 2wks-now more constant Nutritional Status BMI of > 30 = obese  Have you ever been in a relationship where you felt threatened, hurt or afraid?No   Does patient need assistance? Functional Status Self care Ambulation Normal   Primary Care Provider:  Mariea Stable MD  CC:  pt here for pap but has noticed some "spotting", also c/o pain/swelling in right hand, and some tingling and numbness on occ.  History of Present Illness: [name] is a 50 yo woman with PMH as outlined below.  She is here for pain in right hand.  It is located over thenar/thumb area.  There is a small area of swelling over the 2nd MTP joint.  Ongoing for about 2 weeks.  Initially waxed and waned, now constant over the last week and worsening.  Took a vicodin that she had for tooth extraction and it helped.  She has also tried ibuprofen, creams, ice, etc without help.    Current Medications (verified): 1)  Diclofenac Sodium 75 Mg Tbec (Diclofenac Sodium) .Marland Kitchen.. 1 Tab By Mouth With Food Bid  Allergies: 1)  ! Floxin Otic  Physical Exam  General:  Well appearing, developed, nourished. Obese. NAD. Lungs:  normal respiratory effort and no accessory muscle use.   Msk:  mild point tenderness over 2nd MTP joint right hand.  no erythema, swelling, warmth noted.  no deformity.  normal ROM and strength. Neurologic:  alert & oriented X3, cranial nerves II-XII intact, strength normal in all extremities, and gait normal.   Psych:  Oriented X3, memory intact for recent and remote, normally interactive, good eye contact, not anxious appearing, and not depressed appearing.     Impression & Recommendations:  Problem # 1:  HAND PAIN, RIGHT (ICD-729.5) Unclear etiology. No concerning signs on exam to suggest inflammatory arthritis. Doubt X ray will be of benefit as are blood tests. Will treat conservatively with NSAIDs/tylenol If any worsening, pt is to call[facility] pt is agreeable.   Complete Medication List: 1)  Diclofenac Sodium 75 Mg Tbec (Diclofenac sodium) .Marland Kitchen.. 1 tab by mouth with food bid  Patient Instructions: 1)  Please schedule a follow-up appointment in 1 month. 2)  Continue diclofenac as prescribed 3)  may use 650-1000mg  of Tylenol every 4-6 hours as needed for relief of pain or comfort of fever AVOID taking more than 4000mg   in a 24 hour period (can cause liver damage in higher doses). 4)  If it gets worse, call clinic to be seen sooner.   Prevention & Chronic Care Immunizations   Influenza vaccine: Fluvax 3+  (08/29/2009)    Tetanus booster: 08/29/2009: Tdap    Pneumococcal vaccine: Not documented  Other Screening   Pap smear: Not documented  Pap smear action/deferral: Deferred  (08/29/2009)    Mammogram: ASSESSMENT: Negative - BI-RADS 1^MM DIGITAL SCREENING  (09/06/2009)   Mammogram action/deferral: Ordered  (08/29/2009)   Smoking status: never  (08/16/2006)  Lipids   Total Cholesterol: 185  (09/02/2009)   Lipid panel action/deferral: Lipid Panel ordered   LDL: 106  (09/02/2009)   LDL Direct: Not documented   HDL: 47  (09/02/2009)   Triglycerides: 161  (09/02/2009)

## 2010-08-17 NOTE — Assessment & Plan Note (Signed)
Summary: FU VISIT/VS   Vital Signs:  Patient Profile:   50 Years Old Female Height:     62 inches (157.48 cm) Weight:      238.04 pounds (108.20 kg) Temp:     96.9 degrees F (36.06 degrees C) oral BP sitting:   111 / 80  (right arm)  Pt. in pain?   yes    Location:   foot    Intensity:   8    Type:       aching  Vitals Entered By: Angelina Ok RN (October 16, 2006 10:46 AM)              Is Patient Diabetic? No Nutritional Status Obese  Have you ever been in a relationship where you felt threatened, hurt or afraid?No   Does patient need assistance? Functional Status Self care Ambulation Normal   Chief Complaint:  follow up mammogram and foot, cough dark white, and yellow.  History of Present Illness: Patient is here for follow up on mammogram results. Patient states was told by radiologist that she only had 2 benign cysts on the L breast. Patient c/o unimproved pain with her right plantar fascitis with no relief with ibuprofen. Patient states tried aleve and other OTC medications and even one of her husbands oxycontin pills with minimal relief. 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 Patient states she has been doing all exercises given to her at her last visit and still with minimal improvement. No other complaints.  Prior Medications: IBUPROFEN 200 MG TABS (IBUPROFEN) 3-4 tablets 3 times daily as needed DARVOCET-N 100  TABS (PROPOXYPHENE N-APAP TABS) 1 tablet every 4 hours as needed for pain Current Allergies (reviewed today): ! FLOXIN OTIC (OFLOXACIN SOLN)    Risk Factors: Tobacco use:  never   Review of Systems  The patient denies fever, weight loss, vision loss, chest pain, syncope, dyspnea on exhertion, peripheral edema, prolonged cough, hemoptysis, abdominal pain, melena, hematochezia, and hematuria.         Per HPI   Physical Exam   General:     alert, well-developed, well-nourished, and normal appearance.   Head:     normocephalic and atraumatic.   Eyes:     vision grossly intact, pupils equal, pupils round, pupils reactive to light, and pupils react to accomodation.   Mouth:     pharynx pink and moist, no erythema, no exudates, no lesions, no aphthous ulcers, and no erosions.   Neck:     supple and full ROM.   Lungs:     normal respiratory effort, no intercostal retractions, no accessory muscle use, normal breath sounds, no dullness, no crackles, and no wheezes.   Heart:     normal rate, regular rhythm, no murmur, no gallop, no rub, and no JVD.   Abdomen:     soft, non-tender, normal bowel sounds, no distention, no masses, no guarding, no rigidity, and no rebound tenderness.   Pulses:     2 + bilateral radial and DP pulses. Extremities:     No c/c/e. R heel and medial aspect of  mid plantar region TTP with slight ball felt, but decreased.    Impression & Recommendations:  Problem # 1:  LUMP OR MASS IN BREAST (ICD-611.72)  Left Breast US on 09/20/06 with 2 small cysts with no evidence of malignancy. Patient  reassured and repeat mammogram in 1 year.  Problem # 2:  PLANTAR FASCIITIS, RIGHT (ICD-728.71) Patient states has been doing exercises as told. No relief with OTC NSAIDS. Patient told natural course with conservative management may take 6 - 12 months, and need to rest foot as often as possible.  Will prescribe Darvocet N100 for next 2 weeks and have patient continue exercises and follow up in 2 weeks for steriod injection if no improvement with symptoms. Her updated medication list for this problem includes:    Ibuprofen 200 Mg Tabs (Ibuprofen) .Marland Kitchen... 3-4 tablets 3 times daily as needed   Problem # 3:  SLEEP APNEA, OBSTRUCTIVE (ICD-327.23) Will refer patient for sleep study to see if she indeed has sleep apnea although her clinical symptoms indicate it. May need CPAP after study is done. Orders: Sleep  Disorder Referral (Sleep Disorder)   Medications Added to Medication List This Visit: 1)  Darvocet-n 100 Tabs (Propoxyphene n-apap tabs) .Marland Kitchen.. 1 tablet every 4 hours as needed for pain   Patient Instructions: 1)  Follow up October 29 2006 in morning for plantar fascitis for steriod injection. 2)  Use Darvocet N 100, 1 tablet every 4 hours as needed for foot pain. Prescriptions: DARVOCET-N 100  TABS (PROPOXYPHENE N-APAP TABS) 1 tablet every 4 hours as needed for pain  #60 x 0   Entered and Authorized by:   Ramiro Harvest MD   Signed by:   Ramiro Harvest MD on 10/16/2006   Method used:   Handwritten   RxID:   [id number]

## 2010-08-17 NOTE — Assessment & Plan Note (Signed)
Summary: F/U FOOT,MC   Vital Signs:  Patient profile:   50 year old female BP sitting:   109 / 73  Vitals Entered By: Lillia Pauls CMA (January 04, 2010 8:46 AM)  Primary Provider:  Mariea Stable MD   History of Present Illness: Reports to f/u left plantar fibromatosis and right hand pain.  Received a corticosteroid injection of left PF nodule after respective fenestration during LOV. Significantly decreased prominence, pain, and swelling. Taking tramadol and diflofenac. Still feels increased pain when she runs out of diclofenac.  Hand pain and swelling about dorsal 2nd MCP joint unchanged. Right hand domintant. Pain worst on prolonged activities; relieved by rest. No paresthesias. No known hx of hand fx, procedures, or arthritis.   Allergies: 1)  ! Floxin Otic  Physical Exam  General:  Well-developed,well-nourished,in no acute distress; alert,appropriate and cooperative throughout examination Msk:  LEFT PLANTAR FASCIA: SIgnificantly decreased ttp, swelling, and prominence on clinical examination.  ULTRASOUND LEFT PLANTAR FASCIA: Decreased size of main plantar fascia nodule. Mildly increased size of communicating nodule.  RIGHT HAND: Mod ttp 2nd MCP joint & along extensor tendon as well. Mild swelling. No skin discoloration. FROM. Normal nv exam.  ULTRASOUND RIGHT HAND: Normal appearance of extensor tendon on ultrasound examination. Some increased fluid within the 2nd MCP joint and small spur on the dorsal aspect of 2nd MC just proximal to MCP joint. Pulses:  Normal nv exam. Neurologic:  Normal nv exam.   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 - Continue modified hapad shoe insert with doughnut hole. - RTC in 4 wks. Consider ultrasound-guided corticosteroid injection during next office visit.  Her updated medication list for this problem includes:    Diclofenac Sodium  75 Mg Tbec (Diclofenac sodium) .Marland Kitchen... 1 tab by mouth with food bid  Problem # 2:  OTHER TENOSYNOVITIS OF HAND AND WRIST (ICD-727.05) Assessment: Unchanged Likely component of arthritis  After obtaining informed verbal consent from the patient, the dorsal aspect of the 2nd MCP joint was prepped with alcohol and betadine. Ethyl chloride was used to anesthetize the skin. A 0.38ml to 1ml mixture of lidocaine and kenalog 10mg /ml was injected into the 2nd MCP joint of the right hand without complications or difficulty. The patient tolerated the procedure well. Normal motion and neurovascular exam following the procedure.  - Diclofenac and tramadol refilled. - X-rays of the left hand. Will call patient with results. - Immediately seek MD attention for fever, skin redness, increased swelling, increased pain, or any other concerns. Otherwise, RTC in 4 weeks for interim reassessment.  Orders: Kenalog 10 mg inj (J3301)  Complete Medication List: 1)  Diclofenac Sodium 75 Mg Tbec (Diclofenac sodium) .Marland Kitchen.. 1 tab by mouth with food bid 2)  Tramadol Hcl 50 Mg Tabs (Tramadol hcl) .Marland Kitchen.. 1-2 tabs by mouth qhs as needed severe pain 3)  Soft Plastazote Shoe Inserts  .... Dx: plantar fibromatosis (symptomatic).  please dispense two pairs of inserts for use in shoes. 4)  Lortab 5-500 Mg Tabs (Hydrocodone-acetaminophen) .... Take one tab qhs  Other Orders: Radiology other (Radiology Other) Joint Aspirate / Injection, Small ([id number]) Prescriptions: TRAMADOL HCL 50 MG TABS (TRAMADOL HCL) 1-2 tabs by mouth qHS as needed severe pain  #60 x 1   Entered and Authorized by:   Valarie Merino MD   Signed by:   Lillia Pauls CMA on 01/04/2010   Method used:   Print then Give to Patient  RxID:   [id number] DICLOFENAC SODIUM 75 MG TBEC (DICLOFENAC SODIUM) 1 tab by mouth with food BID  #60 x 1   Entered and Authorized by:   Valarie Merino MD   Signed by:   Lillia Pauls CMA on 01/04/2010   Method used:   Print then Give to  Patient   RxID:   [id number]

## 2010-08-17 NOTE — Assessment & Plan Note (Signed)
Summary: walk-in, Xray ordered  Pt came in today c/o pain in left arm wrist area.  Had hammer fall on wrist 3-4 weeks ago.  Some pain and swelling.  wearing wrist brace.  Helps some.  Discussed with DR. Delainey Winstanley [place].  Order received to have pt get Xray of wrist and to return for a scheduled appointment. ..................................................................Marland KitchenAngelina Ok RN  August 14, 2006 9:17 AM

## 2010-08-17 NOTE — Assessment & Plan Note (Signed)
Summary: [facility] PT FOOT PAIN   Vital Signs:  Patient profile:   50 year old female Height:      62 inches Weight:      234 pounds BP sitting:   140 / 76  Vitals Entered By: Lillia Pauls CMA (August 31, 2009 1:42 PM)  Primary Provider:  Mariea Stable MD  CC:  L foot pain.  History of Present Illness: Pt is 50 y/o female with h/o  ~3 years of L foot pain. no known injuries. h/o L meniscal repair x2, R meniscal repair x1. states left foot (arch) pain worse with standing for long periods and as day progresses. patient unable to wear shoes with soft soles; has to wear construction boots to minimize pain. some improvement in pain with ice, high dose ibuprofen, staying off feet. Causing patient to limp. No numbness, tingling, weakness. No prior left foot imaging or procedures. No prior evaluation for this issue.  Current Medications (verified): 1)  Ibuprofen 800 Mg  Tabs (Ibuprofen) .... Take 1 Tablet By Mouth Three Times A Day  Allergies (verified): 1)  ! Floxin Otic PMH-FH-SH reviewed for relevance  Physical Exam  General:  Well appearing, developed, nourished. Obese. NAD. Msk:  R FOOT: Transverse arch collapse. Splayed 1st and 2nd toes. Borderline morton's.  Pes planus with excessive pronation.  FROM throughout ankle/foot/toes.  L FOOT: Transverse arch collapse. Splayed 1st and 2nd toes. Borderline morton's.  Pes planus with excessive pronation.  Medial plantar fascial ttp at midfoot.  Very tender, non-erythematous, mildly indurated, dry nodule at mid arch. No increased warmth. FROM thoughout ankle/foot/toes.  GAIT: Antalgic. Favoring left foot. No instability. Pulses:  Normal dp pulses. Neurologic:  Sensation intact. Additional Exam:  Musculoskeletal Ultrasound: Longituinal and transverse view of the left foot revealed the following:  Intact plantar fascia (PF) with proximal thickness of 0.30 cm. Along the midfoot, there is a 0.43 cm diameter cyst along the  superficial aspect of the PF with evidence of loculation on transverse view. There is an adjacent, possibly confluent, 0.30 cm diameter cyst distally. The cysts do not appear to extend beneath the plantar fascia.     Impression & Recommendations:  Problem # 1:  FOOT PAIN, LEFT (ICD-729.5) Assessment Unchanged Likely 2/2 nodule possibly arising from her plantar fascia.   - Will obtain MRI with & without contrast to further evaluate her cyst and to r/o potential maligant etiology. - Will determine further mgmt based on MRI findings and on clinial picture. - Continue hard-sole boots for now given increased comfort while wearing them.  Orders: MRI with & without Contrast (MRI w&w/o Contrast) US EXTREMITY NON-VASC REAL-TIME IMG ([id number])  Patient Instructions: 1)  Your appt for your MRI is on Monday, Feb. 21st at 3pm at [facility]. Register in admitting at 2:30pm  Appended Document: [facility] PT FOOT PAIN I discussed MRI result advised icing for pain come back to [facility] and we will try to make an insert to take pressure off PF

## 2010-08-17 NOTE — Consult Note (Signed)
Summary: Sleep study  Sleep study   Imported By: Florinda Marker 01/03/2007 11:13:28  _____________________________________________________________________  External Attachment:    Type:   Image     Comment:   External Document

## 2010-08-17 NOTE — Letter (Signed)
Summary: Handout Printed  Printed Handout:  - *Patient Instructions 

## 2010-08-17 NOTE — Assessment & Plan Note (Signed)
Summary: FU VISIT/VS   Vital Signs:  Patient Profile:   50 Years Old Female Height:     62 inches (157.48 cm) Weight:      241.03 pounds (109.56 kg) Temp:     97.2 degrees F (36.22 degrees C) oral Pulse rate:   64 / minute BP sitting:   110 / 76  (right arm)  Pt. in pain?   yes    Location:   foot    Intensity:   5    Type:       sharp  Vitals Entered By: Angelina Ok RN (October 29, 2006 9:57 AM)              Is Patient Diabetic? No Nutritional Status Obese  Does patient need assistance? Functional Status Self care Ambulation Impaired:Risk for fall   Chief Complaint:  Injection for heel spur.  History of Present Illness: Patient here for steriod injection of right foot. Patient states has had a cold and as such as been in bed for most of the time, and noted some relief with rest. Patient states however, that once she starts walking on her foot it does aggravate the pain. Darvocet has given some relief with pain, and she has been performing the exercises as given to her. No other complaints.  Prior Medications (reviewed today): IBUPROFEN 200 MG TABS (IBUPROFEN) 3-4 tablets 3 times daily as needed DARVOCET-N 100  TABS (PROPOXYPHENE N-APAP TABS) 1 tablet every 4 hours as needed for pain Current Allergies (reviewed today): ! FLOXIN OTIC (OFLOXACIN SOLN)     Review of Systems  The patient denies fever, chest pain, syncope, dyspnea on exhertion, peripheral edema, prolonged cough, and muscle weakness.     Physical Exam  Pulses:     2 + Bilateral DP and radial pulses Extremities:     No c/c/e. Medial plantar surface of R foot with ball in medial region and TTP. No edema, no erythema.    Impression & Recommendations:  Problem # 1:  PLANTAR FASCIITIS, RIGHT (ICD-728.71) Slight improvement in pain with rest and Darvocet, however still painful on ambulation. Will give a steriod injection to plantar region with Kenalog 30mg  in 1 % lidociane, under direct supervision of  Dr Okey Dupre. The risks and benefits were explained to the patient and consent was signed and patient agrreed to go ahead with the procedure.  Patient with immediate relief after injection. Patient also given a hard sole shoe for heel support to be worn for the next week. Will follow up in 2 weeks to  reassess. Continue exercises, foot rest and Darvocet as needed for symptomatic relief. Her updated medication list for this problem includes:    Ibuprofen 200 Mg Tabs (Ibuprofen) .Marland Kitchen... 3-4 tablets 3 times daily as needed  Orders: Admin of Therapeutic Inj  intramuscular or subcutaneous (16109) EMR Misc Charge Code [facility])    Patient Instructions: 1)  Please schedule a follow-up appointment in 2 weeks for plantar fasciitis. 2)  Continue Darvocet as needed for pain. 3)  Wear flat shoe for next 1 week and continue to rest foot as able to and continue exercises given out at prior visit.   Medication Administration  Injection # 1:    Medication: Kenalog 30mg     Diagnosis: PLANTAR FASCIITIS, RIGHT (ICD-728.71)    Route: IM    Site: right foot    Comments: mixed with 1% lidocaine and injected into the right plantar region of the foot    Given by: Ramiro Harvest MD (  October 29, 2006 10:43 AM)  Orders Added: 1)  Est. Patient Level III [99213] 2)  Admin of Therapeutic Inj  intramuscular or subcutaneous [90772] 3)  EMR Misc Charge Code [EMRMisc]

## 2010-08-17 NOTE — Progress Notes (Signed)
Summary: refill/ hla  Phone Note Call from Patient   Summary of Call: pt states ibuprofen not working, please advise Initial call taken by: Marin Roberts RN,  September 13, 2009 9:44 AM  Follow-up for Phone Call        Pt can contact Dr. Geoffery Lyons as she was referred to them for the foot pain.  Furthermore, they have ordered an MRI to further assess that has already been done. Follow-up by: Mariea Stable MD,  September 13, 2009 10:43 AM  Additional Follow-up for Phone Call Additional follow up Details #1::        spoke w/ pt, she is agreeable Additional Follow-up by: Marin Roberts RN,  September 13, 2009 2:30 PM

## 2010-08-17 NOTE — Assessment & Plan Note (Signed)
Summary: Social Work   Social Work Evaluation Date  11/13/2006 Patient name [name]  Primary MD   : Ramiro Harvest, MD Social Worker's name : Dorothe Pea MSW- LCSW  Home [phone]    Cell phone: .  Marland Kitchen     Alternate phone: . Marland Kitchen       Individual making referral: Gladys/Thompson  Primary Reason for Referral:     Assist with Disability Process or Referral to Voc. Rehab Comments Pt inquiring about disability. 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  Difficulties sitting and  standing for long periods of time.  Complains of back pain. Prior work experience:  cleaning, phlebotomy.  Spouse at home on disability earning around $850 per month.  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  Learning bookkeeping.  Since pt speaks fluent Spanish as well as English she would also like to explore certification in interpreter services.    Action taken by Social Work: Met with patient in exam room. Discussed disability process and encouraged her to pursue rehabilitative course including connecting to [facility] and [employer].    I also gave her information about [facility] so she can explore possibility of  becoming an interpreter through them.  Training schedule for interpreter classes provided.   I've alerted Venita Sheffield to likely need for orthopedic surgical eval through [facility].   Social Work involvement as needed.
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
## 2010-08-17 NOTE — Assessment & Plan Note (Signed)
Summary: FU L FOOT PAIN   Vital Signs:  Patient profile:   50 year old female BP sitting:   127 / 82  Vitals Entered By: Lillia Pauls CMA (September 21, 2009 3:30 PM)  Primary Provider:  Mariea Stable MD   History of Present Illness: diclofenac somewhat helping to relieve pain. Derives most relief from ice and rest. Notes similar mid-arch nodule on right foot; currently asymptomatic and of stable appearance. Interested in surgical intervention but oncerned about her financial limitations.  Allergies: 1)  ! Floxin Otic  Physical Exam  General:  Well-developed,well-nourished,in no acute distress; alert,appropriate and cooperative throughout examination Msk:  Unchanged from last examination. Note of similar nodule along right mid-arch w/tenderness only to deep palpation; no discoloration or swelling.   Impression & Recommendations:  Problem # 1:  PLANTAR FIBROMATOSIS (ICD-728.71) Bilateral plantar fibromatosis.  Symptomatic on the left. Asymptomatic on the right.  - Comforthotics with plantar fasciitis hapad. - Depression created to increased nodular comfort on both side. - diclofenac refilled. - RTC in 4 wks. Will talk to Rudell Cobb re: financial assistance as she would like to pursue surgical intervention.  Her updated medication list for this problem includes:    Ibuprofen 800 Mg Tabs (Ibuprofen) .Marland Kitchen... Take 1 tablet by mouth three times a day    Diclofenac Sodium 75 Mg Tbec (Diclofenac sodium) .Marland Kitchen... 1 tab by mouth with food bid  Problem # 2:  PLANTAR FASCIITIS, LEFT (ICD-728.71)  - Add daily reverse heel walks to current PF stretching routine. - Comforthotics with plantar fasciitis padding. - RTC in 4 wks.  Her updated medication list for this problem includes:    Ibuprofen 800 Mg Tabs (Ibuprofen) .Marland Kitchen... Take 1 tablet by mouth three times a day    Diclofenac Sodium 75 Mg Tbec (Diclofenac sodium) .Marland Kitchen... 1 tab by mouth with food bid  Complete Medication List: 1)   Ibuprofen 800 Mg Tabs (Ibuprofen) .... Take 1 tablet by mouth three times a day 2)  Diclofenac Sodium 75 Mg Tbec (Diclofenac sodium) .Marland Kitchen.. 1 tab by mouth with food bid Prescriptions: DICLOFENAC SODIUM 75 MG TBEC (DICLOFENAC SODIUM) 1 tab by mouth with food BID  #60 x 0   Entered by:   Lillia Pauls CMA   Authorized by:   Valarie Merino MD   Signed by:   Lillia Pauls CMA on 09/21/2009   Method used:   Print then Give to Patient   RxID:   [id number] DICLOFENAC SODIUM 75 MG TBEC (DICLOFENAC SODIUM) 1 tab by mouth with food BID  #60 x 0   Entered and Authorized by:   Valarie Merino MD   Signed by:   Valarie Merino MD on 09/21/2009   Method used:   Print then Give to Patient   RxID:   [id number]   Appended Document: FU L FOOT PAIN Reports to f/u plantar fibromatosis. Recent MRI report reviewed with the patient via telephone prior to this encounter.

## 2010-10-11 ENCOUNTER — Encounter: Payer: Self-pay | Admitting: Internal Medicine

## 2010-10-30 ENCOUNTER — Emergency Department (HOSPITAL_COMMUNITY)
Admission: EM | Admit: 2010-10-30 | Discharge: 2010-10-30 | Disposition: A | Discharge status: Home / Self Care | Payer: Self-pay | Attending: Emergency Medicine | Admitting: Emergency Medicine

## 2010-10-30 DIAGNOSIS — K089 Disorder of teeth and supporting structures, unspecified: Secondary | ICD-10-CM | POA: Insufficient documentation

## 2010-10-30 DIAGNOSIS — R51 Headache: Secondary | ICD-10-CM | POA: Insufficient documentation

## 2010-10-30 DIAGNOSIS — K0510 Chronic gingivitis, plaque induced: Secondary | ICD-10-CM | POA: Insufficient documentation

## 2010-11-10 ENCOUNTER — Other Ambulatory Visit: Payer: Self-pay | Admitting: *Deleted

## 2010-11-13 MED ORDER — TRAMADOL HCL 50 MG PO TABS: ORAL_TABLET | ORAL | Status: DC

## 2010-11-13 MED ORDER — DICLOFENAC SODIUM 75 MG PO TBEC: 75.0000 mg | DELAYED_RELEASE_TABLET | Freq: Two times a day (BID) | ORAL | Status: DC

## 2010-11-28 NOTE — Procedures (Signed)
[name], COPPOCK          ACCOUNT NO.:  [id number]   MEDICAL RECORD NO.:  [id number]          PATIENT TYPE:  OUT   LOCATION:  SLEEP CENTER                 FACILITY:  [facility]   PHYSICIAN:  Clinton D. Maple Hudson, MD, FCCP, FACPDATE OF BIRTH:  July 28, 1961   DATE OF STUDY:  12/05/2006                            NOCTURNAL POLYSOMNOGRAM   REFERRING PHYSICIAN:   INDICATION FOR STUDY:  Hypersomnia with sleep apnea.   Epworth sleepiness score 6/4.  Height 62 inches.  Weight 238 pounds.  No  medications reported.   SLEEP ARCHITECTURE:  Total sleep time 282 minutes with sleep efficiency  66%.  Stage I was 6%, stage II 70%, stages III and IV 17%.  REM 7% of  total sleep time.  Sleep latency 37 minutes.  REM latency 219 minutes.  Awake after sleep onset 110 minutes.  Arousal index 21.3.  No bedtime  medication was taken.   RESPIRATORY DATA:  Apnea/hypopnea index (AHI, RDI) 17.2 obstructive  events per hour, indicating moderate obstructive sleep apnea/hypopnea  syndrome.  There were 26 obstructive apneas and 55 hypopneas.  The  events were positional, mostly associated with supine sleep position.  REM AHI 2.9.  There were insufficient events to qualify for CPAP  titration by split study within the required time allowed by protocol on  this study night.   OXYGEN DATA:  Mild to moderate intermittent snoring with oxygen  desaturation to a nadir of 84%.  Mean oxygen saturation through the  study was 94% on room air.   CARDIAC DATA:  Normal sinus rhythm.   MOVEMENT/PARASOMNIA:  A total of 263 limb jerks were recorded of which  29 were associated with arousal or awakening for periodic limb movement  with arousal index of 6.2 per hour.   IMPRESSION/RECOMMENDATION:  1. Moderate obstructive sleep apnea/hypopnea syndrome, AHI 17.2 per      hour.  Mild to moderate snoring with oxygen desaturation to a nadir      of 84%.  The events were positional mostly associated with supine      sleep  position.  2. She did not qualify for CPAP titration within the limits of the      study protocol on this study night.  If she can be comfortable      sleeping on her sides, this may be a significant therapeutic      option.  Otherwise, consider return for      CPAP titration or alternative therapies as appropriate.  3. Periodic limb movement with arousal, 6.2 per hour.      Clinton D. Maple Hudson, MD, FCCP, FACP  Diplomate, Biomedical engineer of Sleep Medicine  Electronically Signed     CDY/MEDQ  D:  12/09/2006 10:14:07  T:  12/09/2006 13:33:31  Job:  [id number]

## 2010-12-15 ENCOUNTER — Encounter: Payer: Self-pay | Admitting: Internal Medicine

## 2010-12-20 ENCOUNTER — Encounter: Payer: Self-pay | Admitting: Internal Medicine

## 2011-01-31 ENCOUNTER — Ambulatory Visit (INDEPENDENT_AMBULATORY_CARE_PROVIDER_SITE_OTHER): Payer: Self-pay | Admitting: Internal Medicine

## 2011-01-31 ENCOUNTER — Encounter: Payer: Self-pay | Admitting: Internal Medicine

## 2011-01-31 VITALS — BP 120/78 | HR 66 | Temp 97.6°F | Ht 62.0 in | Wt 210.3 lb

## 2011-01-31 DIAGNOSIS — M722 Plantar fascial fibromatosis: Secondary | ICD-10-CM

## 2011-01-31 DIAGNOSIS — E785 Hyperlipidemia, unspecified: Secondary | ICD-10-CM

## 2011-01-31 DIAGNOSIS — E119 Type 2 diabetes mellitus without complications: Secondary | ICD-10-CM

## 2011-01-31 LAB — GLUCOSE, CAPILLARY: Glucose-Capillary: 323 mg/dL — ABNORMAL HIGH (ref 70–99)

## 2011-01-31 LAB — POCT GLYCOSYLATED HEMOGLOBIN (HGB A1C): Hemoglobin A1C: 10.8

## 2011-01-31 MED ORDER — PRAVASTATIN SODIUM 40 MG PO TABS: 40.0000 mg | ORAL_TABLET | Freq: Every day | ORAL | Status: DC

## 2011-01-31 MED ORDER — GLIPIZIDE 5 MG PO TABS: 5.0000 mg | ORAL_TABLET | Freq: Every day | ORAL | Status: DC

## 2011-01-31 MED ORDER — HYDROCODONE-ACETAMINOPHEN 5-500 MG PO TABS: 1.0000 | ORAL_TABLET | Freq: Three times a day (TID) | ORAL | Status: DC | PRN

## 2011-01-31 NOTE — Assessment & Plan Note (Signed)
Feet becoming increasingly painful, and refractory to current pain meds -referral to orthopedic surgery -vicodin for short-term pain relief -continue tramadol

## 2011-01-31 NOTE — Patient Instructions (Addendum)
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  Please take 1 tablet per day.  Please return for a follow-up visit in 1-2 weeks, so we can assess your blood glucose levels.  Ganglion A ganglion is a swelling under the skin that is filled with a thick, jelly-like substance. It is a synovial cyst. This is caused by a break (rupture) of the joint lining from the joint space. A ganglion often occurs near an area of repeated minor trauma (damage caused by an accident). Trauma may also be a repetitive movement at work or in a sport. TREATMENT It often goes away without treatment. It may reappear later. Sometimes a ganglion may need to be surgically removed. Often they are drained and injected with a steroid. Sometimes they respond to:  Rest.   Occasionally splinting helps.  HOME CARE INSTRUCTIONS  Your caregiver will decide the best way of treating your ganglion. Do not try to break the ganglion yourself by pressing on it, poking it with a needle, or hitting it with a heavy object.   Use medications as directed.  1.  2. SEEK MEDICAL CARE IF:   The ganglion becomes larger or more painful.   You have increased redness or swelling.   You have weakness or numbness in your hand or  wrist.  MAKE SURE YOU:   Understand these instructions.   Will watch your condition.   Will get help right away if you are not doing well or get worse.  Document Released: 06/29/2000 Document Re-Released: 09/28/2008 [facility] Patient Information 2011 [place], [place].

## 2011-01-31 NOTE — Assessment & Plan Note (Signed)
History of hyperlipidemia -patient previously did not receive/fill her pravastatin prescription -re-prescribed pravastatin, will f/u with lipid panel in a few months

## 2011-01-31 NOTE — Assessment & Plan Note (Signed)
The patient has a recent diagnosis of diabetes, currently uncontrolled, with medication non-compliance for the last 6 months -after a discussion with Jamison Neighbor, we started the patient on PO glipizide -if the patient can get on the Medication Assistance Program, we would like to add an incretin memetic (currently prohibitively expensive) -referral to Jamison Neighbor for diabetes education and to get the patient her own glucometer (currently using her husband's) -follow-up in 1-2 weeks to determine response to glipizide, and to adjust medication regimen -patient instructed to take daily cbg's and bring those numbers to her next appointment -microalbumin checked today

## 2011-01-31 NOTE — Progress Notes (Signed)
The patient is a 50 yo woman with a recent diagnosis of diabetes and hyperlipidemia in Jan 2012, presenting for a follow-up.  The patient was prescribed Metformin at her last appointment, and notes trying the medication for 1 month, but reports significant nausea, vomiting, and diarrhea, causing her to stop the medication about 5 months ago.  She notes some diet and lifestyle changes upon receiving the diagnosis of diabetes, but admits that she has slipped back into her bad eating habits in the last 1.5 months due to a lot of "parties".  The patient seldom checks her CBG, but when she does, her levels are in the 300's.  She also reports about 1 episode/week of "shakiness", when he blood sugar drops to a low of 70.  No polyuria/polydipsia, blurry vision, or altered mental status.  The patient also notes a 2-3 yr history of painful nodules on the soles of her feet, previously diagnosed as plantar fibromatoses.  She has received cortisol injections in the sports medicine clinic, but aspiration has been unsuccessful, and tramadol is becoming less and less effective as the nodules increase in size.  Diclofenac is no longer effective in controlling her pain.  Per the patient, the sports medicine clinic recommended that she have the nodules excised by orthopedic surgery.  The patient was also diagnosed with hyperlipidemia at her last visit, with an LDL of 152, but states that her pravachol prescription was never called in to her pharmacy, and so she has not been taking this medication.  The patient has previously had [employer], but it recently expired.  The patient is going to try to get her [employer] Card renewed today.  ROS: General: no fevers, chills, changes in weight, changes in appetite Skin: no rash HEENT: no blurry vision, hearing changes, sore throat Pulm: no dyspnea, coughing, wheezing CV: no chest pain, palpitations, shortness of breath Abd: no abdominal pain, nausea/vomiting,  diarrhea/constipation Ext: no arthralgias, myalgias Neuro: no weakness, numbness, or tingling  Filed Vitals:   01/31/11 1342  BP: 120/78  Pulse: 66  Temp: 97.6 F (36.4 C)   Hb A1C = 10.8  PEX General: alert, cooperative, and in no apparent distress HEENT: pupils equal round and reactive to light, vision grossly intact, oropharynx clear and non-erythematous  Neck: supple, no lymphadenopathy, JVD, or carotid bruits Lungs: clear to ascultation bilaterally, normal work of respiration, no wheezes, rales, ronchi Heart: regular rate and rhythm, no murmurs, gallops, or rubs Abdomen: soft, non-tender, non-distended, normal bowel sounds Msk: no joint edema, warmth, or erythema Extremities: 2+ DP/PT pulses bilaterally, no cyanosis, clubbing, or edema Neurologic: alert & oriented X3, cranial nerves II-XII intact, strength grossly intact, sensation intact to light touch  Assessment/Plan

## 2011-02-01 NOTE — Progress Notes (Signed)
I agree with assessment and plan as per Dr. Brown. 

## 2011-02-14 ENCOUNTER — Ambulatory Visit: Payer: Self-pay | Admitting: Dietician

## 2011-02-14 ENCOUNTER — Encounter: Payer: Self-pay | Admitting: Internal Medicine

## 2011-02-15 ENCOUNTER — Ambulatory Visit: Payer: Self-pay | Admitting: Dietician

## 2011-02-15 ENCOUNTER — Ambulatory Visit (INDEPENDENT_AMBULATORY_CARE_PROVIDER_SITE_OTHER): Payer: Self-pay | Admitting: Internal Medicine

## 2011-02-15 ENCOUNTER — Encounter: Payer: Self-pay | Admitting: Internal Medicine

## 2011-02-15 VITALS — BP 110/71 | HR 60 | Temp 97.1°F | Ht 62.0 in | Wt 212.9 lb

## 2011-02-15 DIAGNOSIS — E119 Type 2 diabetes mellitus without complications: Secondary | ICD-10-CM

## 2011-02-15 DIAGNOSIS — E785 Hyperlipidemia, unspecified: Secondary | ICD-10-CM

## 2011-02-15 MED ORDER — GLIPIZIDE 5 MG PO TABS: 5.0000 mg | ORAL_TABLET | Freq: Every day | ORAL | Status: DC

## 2011-02-15 MED ORDER — WAVESENSE PRESTO W/DEVICE KIT: 1.0000 | PACK | Freq: Once | Status: DC

## 2011-02-15 MED ORDER — LIRAGLUTIDE 18 MG/3ML ~~LOC~~ SOLN: SUBCUTANEOUS | Status: DC

## 2011-02-15 MED ORDER — METFORMIN HCL 500 MG PO TABS: ORAL_TABLET | ORAL | Status: DC

## 2011-02-15 MED ORDER — GLUCOSE BLOOD VI STRP: ORAL_STRIP | Status: DC

## 2011-02-15 MED ORDER — INSULIN PEN NEEDLE 31G X 5 MM MISC: 1.0000 | Freq: Every day | Status: DC

## 2011-02-15 NOTE — Progress Notes (Signed)
  Subjective:    Patient ID: [name], female    DOB: Apr 30, 1961, 50 y.o.   MRN: [id number]  HPI: 50 year old woman with past medical history significant for type 2 diabetes mellitus, hyperlipidemia comes to the clinic for a followup visit.  She got her CBG log book which showed that her fasting blood sugars have been running between 200 to 300's . She states that she has cut down on Gatorade and cokes. She she takes glipizide once daily but is concerned about 3 pounds weight gain since she started that. She wants her to be switched to a different medication that does not cause weight gain. Of note she could not tolerate metformin because of severe nausea, vomiting and diarrhea as she was not taking it with meals.  She also expressed some concerns for decreased libido.    Review of Systems  Constitutional: Negative for fever, activity change, appetite change and fatigue.  HENT: Negative for congestion, rhinorrhea, sneezing, neck pain and postnasal drip.   Respiratory: Negative for apnea, cough, choking, chest tightness, shortness of breath, wheezing and stridor.   Cardiovascular: Negative for chest pain, palpitations and leg swelling.  Gastrointestinal: Negative for abdominal pain and abdominal distention.  Genitourinary: Negative for dysuria, urgency, hematuria and flank pain.  Musculoskeletal: Negative for arthralgias.  Neurological: Negative for dizziness, light-headedness, numbness and headaches.  Hematological: Negative for adenopathy.       Objective:   Physical Exam  Constitutional: She is oriented to person, place, and time. She appears well-developed and well-nourished. No distress.  HENT:  Head: Normocephalic and atraumatic.  Mouth/Throat: No oropharyngeal exudate.  Eyes: Conjunctivae and EOM are normal. Pupils are equal, round, and reactive to light.  Neck: Normal range of motion. Neck supple. No JVD present. No tracheal deviation present. No thyromegaly present.    Cardiovascular: Normal rate, regular rhythm, normal heart sounds and intact distal pulses.  Exam reveals no gallop and no friction rub.   No murmur heard. Pulmonary/Chest: Effort normal and breath sounds normal. No stridor. No respiratory distress. She has no wheezes. She has no rales. She exhibits no tenderness.  Abdominal: Soft. Bowel sounds are normal. She exhibits no distension and no mass. There is no tenderness. There is no rebound and no guarding.  Musculoskeletal: Normal range of motion. She exhibits no edema and no tenderness.  Lymphadenopathy:    She has no cervical adenopathy.  Neurological: She is alert and oriented to person, place, and time. She has normal reflexes. She displays normal reflexes. No cranial nerve deficit. She exhibits normal muscle tone. Coordination normal.  Skin: She is not diaphoretic.          Assessment & Plan:

## 2011-02-15 NOTE — Assessment & Plan Note (Signed)
Continue current dose of pravastatin for now. She was taking pravastatin for just 2 weeks- we'll check her FLP and CMP the next clinic appointment.

## 2011-02-15 NOTE — Assessment & Plan Note (Signed)
His CBG log was reviewed. Patient had fasting blood sugars are running between 200s and 300s. She was concerned about weight gain came from glipizide. She was also seen by Jamison Neighbor during this appointment and we discussed  switching her to the victoza but it will take 4 weeks for it to be approved from medical assistance program. In the meantime patient was advised to continue taking glipizide and metformin was added to her regimen- take 250 mg BID with meals(patient reported that she could not tolerate metformin before as she was not taking it that meals). She was counseled and given clear instructions on how to take metformin. Patient voices understanding.  Prescriptions for the victosa were sent to [place] medical assistance program. - Will also check her microalbumin today. - Will refer her to ophthalmology for diabetic eye exam.

## 2011-02-15 NOTE — Patient Instructions (Addendum)
Please schedule a follow up appointment in 1 month. Please get your glucometer with the next appointment. Please call the clinic if you experience side effects from metformin. Please continue taking metformin with meals and glipizide till you qualify for victoza. Please take your medicines as prescribed.

## 2011-02-16 LAB — MICROALBUMIN / CREATININE URINE RATIO
Creatinine, Urine: 148.3 mg/dL
Microalb Creat Ratio: 3.4 mg/g (ref 0.0–30.0)
Microalb, Ur: 0.5 mg/dL (ref 0.00–1.89)

## 2011-02-23 ENCOUNTER — Telehealth: Payer: Self-pay | Admitting: *Deleted

## 2011-02-23 NOTE — Telephone Encounter (Signed)
Pt calls and ask about ortho referral, states feet are causing lack of sleep, desires appt to f/u, appt given for 8/15 at 1045, dr Dorthula Rue.

## 2011-02-28 ENCOUNTER — Encounter: Payer: Self-pay | Admitting: Internal Medicine

## 2011-02-28 ENCOUNTER — Ambulatory Visit (INDEPENDENT_AMBULATORY_CARE_PROVIDER_SITE_OTHER): Payer: Self-pay | Admitting: Internal Medicine

## 2011-02-28 DIAGNOSIS — M79644 Pain in right finger(s): Secondary | ICD-10-CM

## 2011-02-28 DIAGNOSIS — M722 Plantar fascial fibromatosis: Secondary | ICD-10-CM

## 2011-02-28 DIAGNOSIS — Z23 Encounter for immunization: Secondary | ICD-10-CM

## 2011-02-28 DIAGNOSIS — E119 Type 2 diabetes mellitus without complications: Secondary | ICD-10-CM

## 2011-02-28 DIAGNOSIS — K047 Periapical abscess without sinus: Secondary | ICD-10-CM

## 2011-02-28 DIAGNOSIS — M79609 Pain in unspecified limb: Secondary | ICD-10-CM

## 2011-02-28 LAB — GLUCOSE, CAPILLARY: Glucose-Capillary: 240 mg/dL — ABNORMAL HIGH (ref 70–99)

## 2011-02-28 MED ORDER — METFORMIN HCL 500 MG PO TABS: 1000.0000 mg | ORAL_TABLET | Freq: Two times a day (BID) | ORAL | Status: DC

## 2011-02-28 MED ORDER — TRAMADOL HCL 50 MG PO TABS: ORAL_TABLET | ORAL | Status: DC

## 2011-02-28 MED ORDER — DICLOFENAC SODIUM 75 MG PO TBEC: 75.0000 mg | DELAYED_RELEASE_TABLET | Freq: Two times a day (BID) | ORAL | Status: DC

## 2011-02-28 NOTE — Assessment & Plan Note (Signed)
She reports recurrent dental abscesses. On my exam I did see a small dental abscess /erosion above the upper middle incisor. She also complains of some lower teeth staining. I do not think that her abscess is bad enough to warrant antibiotic treatment. She was advised to call the clinic if it gets worse.Will get a dental referral.

## 2011-02-28 NOTE — Assessment & Plan Note (Addendum)
States that she has not been taking her glipizide( because of weight gain),  has been taking metformin 500 mg twice a day . She still couldn't get approval for her Victoza through MAP. Initially the  patient was not tolerating metformin, so she was started on glipizide and low dose metformin 250 mg twice daily , pending her victoza approval with her last appointment on 02/15/2011. She herself decided to discard glipizide completely and take metformin 500 mg twice daily. Now she claims that she has been tolerating that pretty well. In the setting of her A1c of 10.8,   will increase her metformin to 1000 mg twice daily. If we will be able to get her blood sugars under good control with just metformin then we may not switch her to Victoza. The only reason for switching her to Victoza was - because she  could not tolerate metformin and glipizide was causing her weight gain. Will continue her on metformin for 3 months and followup.

## 2011-02-28 NOTE — Progress Notes (Signed)
  Subjective:    Patient ID: [name], female    DOB: 1961/06/12, 50 y.o.   MRN: [id number]  HPI: 50 year old woman with past medical history significant for type 2 diabetes mellitus, hypertension comes to the clinic for followup visit.  She states that she threw away her glipizide because of the weight gain. She has just been taking metformin 500 mg twice daily. She states that she has been tolerating a metformin pretty well at this time.States that her fasting blood sugars have been running between 150-200.  She complains of right leg pain which is getting more worse recently. Describes his pain as sharp , rates 10/10.  She states that she sometimes  wake up from sleep from that pain. She has been taking tramadol that has been helping her.  She also complains of recurrent dental abscess and staining of her teeth for which she was requesting a dental referral    Review of Systems  Constitutional: Negative for fever, chills and fatigue.  HENT: Positive for dental problem. Negative for nosebleeds, congestion, rhinorrhea, sneezing and postnasal drip.   Eyes: Negative for visual disturbance.  Respiratory: Negative for cough, choking, chest tightness, shortness of breath, wheezing and stridor.   Cardiovascular: Negative for chest pain, palpitations and leg swelling.  Gastrointestinal: Negative for vomiting, abdominal pain, constipation and blood in stool.  Genitourinary: Negative for hematuria and flank pain.  Musculoskeletal: Positive for arthralgias.  Neurological: Negative for facial asymmetry and headaches.  Hematological: Negative for adenopathy.       Objective:   Physical Exam  Constitutional: She is oriented to person, place, and time. She appears well-developed and well-nourished. No distress.  HENT:  Head: Normocephalic and atraumatic.  Mouth/Throat: No oropharyngeal exudate.       A  very small abscess/erosion  in the mucosa above the upper central incisor  Eyes:  Conjunctivae and EOM are normal. Pupils are equal, round, and reactive to light. Right eye exhibits no discharge. Left eye exhibits no discharge. No scleral icterus.  Neck: Normal range of motion. Neck supple. No JVD present. No tracheal deviation present. No thyromegaly present.  Cardiovascular: Normal rate, regular rhythm and intact distal pulses.  Exam reveals no gallop and no friction rub.   No murmur heard. Pulmonary/Chest: Effort normal and breath sounds normal. No stridor. No respiratory distress. She has no wheezes. She has no rales. She exhibits no tenderness.  Abdominal: Soft. Bowel sounds are normal. She exhibits no distension and no mass. There is no tenderness. There is no rebound and no guarding.  Musculoskeletal: Normal range of motion. She exhibits no edema and no tenderness.       2 small swelling on the plantar surface of her left foot, tender to palpation, non- erythematous, not warm to touch.  Lymphadenopathy:    She has no cervical adenopathy.  Neurological: She is alert and oriented to person, place, and time. She has normal reflexes. She displays normal reflexes. No cranial nerve deficit. She exhibits normal muscle tone. Coordination normal.  Skin: Skin is warm. She is not diaphoretic.          Assessment & Plan:

## 2011-02-28 NOTE — Assessment & Plan Note (Signed)
She complains of pain in her right leg for past 2 months and had similar pain 1-2 years ago.  Differentials for her pain include plantar fibromatosis versus neuropathy versus degenerated joint disease. 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 She is not even able to walk properly because of her leg pain. She takes tramadol which helps her. States that she has had steroid shots before which helped her.Will refer her to sports medicine clinic for steroid shorts and orthosis .Refill her tramadol

## 2011-02-28 NOTE — Patient Instructions (Signed)
Please schedule a follow up appointment in 3 months or earlier if needed. Please take your medicines as prescribed. 

## 2011-03-01 DIAGNOSIS — Z0000 Encounter for general adult medical examination without abnormal findings: Secondary | ICD-10-CM

## 2011-03-01 HISTORY — DX: Encounter for general adult medical examination without abnormal findings: Z00.00

## 2011-03-01 NOTE — Assessment & Plan Note (Signed)
She was given a pneumococcal shot today.

## 2011-03-01 NOTE — Progress Notes (Signed)
Addended by: Hassan Buckler on: 03/01/2011 03:28 PM   Modules accepted: Orders

## 2011-03-06 ENCOUNTER — Ambulatory Visit (INDEPENDENT_AMBULATORY_CARE_PROVIDER_SITE_OTHER): Payer: Self-pay | Admitting: Family Medicine

## 2011-03-06 ENCOUNTER — Ambulatory Visit: Payer: Self-pay | Admitting: Family Medicine

## 2011-03-06 ENCOUNTER — Encounter: Payer: Self-pay | Admitting: Family Medicine

## 2011-03-06 VITALS — BP 117/82 | HR 65

## 2011-03-06 DIAGNOSIS — M722 Plantar fascial fibromatosis: Secondary | ICD-10-CM

## 2011-03-06 NOTE — Progress Notes (Signed)
  Subjective:    Patient ID: [name], female    DOB: 1960-09-26, 50 y.o.   MRN: [id number]  HPI  Left foot pain. Previously diagnosed with plantar fibromatosis. She also has this in the right foot but that is not problem at this time. The left foot is extremely painful with any weightbearing, it affects her ADLs. She is unable to do as much is she would like to because of pain with any walking or standing. This is very difficult because she has a paraplegic husband and a  quadriplegic son. 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 Has been seen before for this issue. Has had injection therapy which helped for a while. She's not had an injection in over a year. Was told she needed surgery but has no insurance and has not been able to find a provider for her. Pertinent past medical history: Type 2 diabetes mellitus, history of duptyrens  contracture Review of Systems    denies unusual recent weight gain or loss. No fever. Has had no warmth or erythema in her foot. Denies numbness and tingling in her feet. Objective:   Physical Exam  GENERAL: Well-developed female no acute distress FEET: Bilaterally she has fairly normal longitudinal arch, some loss of transverse arch. There says on her feet. She has normal intact sensation to soft touch. She is normal capillary refill. The plantar fascia on the left foot has 2 very distinct nodules in the midportion of the plantar fascia each nodule is about three fourths of a centimeter in diameter. These are quite tender. The right that has similar single nodule that is 1 cm in diameter and is nontender.  INJECTION: Patient was given informed consent, signed copy in the chart. Appropriate time out was taken. Area prepped and draped in usual sterile fashion. 1-1/2 cc of kenalog plus  3 cc of lidocaine was injected into the each plantar nodule on the fascia using a(n) plantar approach. The patient tolerated the procedure well. There were no complications. Post  procedure instructions were given.       Assessment & Plan:  Plantar fibromatosis. Long discussion about options. Ideally she would have referral to orthopedic surgery or podiatry. At some point surgery could be considered for removal. On the other foot she has actually had sensation of pain after time. It's unclear whether the left foot will follow the same pattern. Hasn't had nothing else to offer her today and cannot get her in with orthopedics anytime soon I did inject directly into each nodule. I'll see her back when necessary. Also gave her an arch and it may be somewhat helpful.

## 2011-03-07 ENCOUNTER — Telehealth: Payer: Self-pay | Admitting: *Deleted

## 2011-03-07 NOTE — Telephone Encounter (Signed)
Pt states she cannot bear weight without significant pain on foot that was injected.  She has iced and taken tramadol and vicodin- still having a lot of pain.   Per Dr. Jennette Kettle - may be painful because there was a significant amount of medication injection into foot.  May take 7-10 days for medication to take effect.  Advised to continue taking the pain medication that is most helpful, soak foot in warm water 3-4 times per day.  Call back in a few days if no better.

## 2011-03-21 ENCOUNTER — Ambulatory Visit: Payer: Self-pay | Admitting: Internal Medicine

## 2011-03-21 ENCOUNTER — Ambulatory Visit: Payer: Self-pay | Admitting: Dietician

## 2011-04-17 ENCOUNTER — Other Ambulatory Visit (HOSPITAL_COMMUNITY): Payer: Self-pay

## 2011-04-25 ENCOUNTER — Encounter (HOSPITAL_COMMUNITY)
Admission: RE | Admit: 2011-04-25 | Discharge: 2011-04-25 | Disposition: A | Discharge status: Home / Self Care | Payer: No Typology Code available for payment source | Source: Ambulatory Visit | Attending: Orthopedic Surgery | Admitting: Orthopedic Surgery

## 2011-04-25 ENCOUNTER — Other Ambulatory Visit (HOSPITAL_COMMUNITY): Payer: Self-pay | Admitting: Orthopedic Surgery

## 2011-04-25 DIAGNOSIS — Z01812 Encounter for preprocedural laboratory examination: Secondary | ICD-10-CM | POA: Insufficient documentation

## 2011-04-25 DIAGNOSIS — M722 Plantar fascial fibromatosis: Secondary | ICD-10-CM

## 2011-04-25 DIAGNOSIS — Z01818 Encounter for other preprocedural examination: Secondary | ICD-10-CM | POA: Insufficient documentation

## 2011-04-25 LAB — COMPREHENSIVE METABOLIC PANEL
ALT: 18 U/L (ref 0–35)
AST: 17 U/L (ref 0–37)
Albumin: 3.3 g/dL — ABNORMAL LOW (ref 3.5–5.2)
Alkaline Phosphatase: 90 U/L (ref 39–117)
BUN: 10 mg/dL (ref 6–23)
CO2: 24 mEq/L (ref 19–32)
Calcium: 9.2 mg/dL (ref 8.4–10.5)
Chloride: 104 mEq/L (ref 96–112)
Creatinine, Ser: 0.51 mg/dL (ref 0.50–1.10)
GFR calc Af Amer: >90 mL/min (ref >90)
GFR calc non Af Amer: >90 mL/min (ref >90)
Glucose, Bld: 206 mg/dL — ABNORMAL HIGH (ref 70–99)
Potassium: 3.9 mEq/L (ref 3.5–5.1)
Sodium: 137 mEq/L (ref 135–145)
Total Bilirubin: 0.6 mg/dL (ref 0.3–1.2)
Total Protein: 6.5 g/dL (ref 6.0–8.3)

## 2011-04-25 LAB — PROTIME-INR
INR: 0.97 (ref 0.00–1.49)
Prothrombin Time: 13.1 seconds (ref 11.6–15.2)

## 2011-04-25 LAB — CBC
HCT: 40.3 % (ref 36.0–46.0)
Hemoglobin: 14.2 g/dL (ref 12.0–15.0)
MCH: 30.1 pg (ref 26.0–34.0)
MCHC: 35.2 g/dL (ref 30.0–36.0)
MCV: 85.4 fL (ref 78.0–100.0)
Platelets: 228 10*3/uL (ref 150–400)
RBC: 4.72 MIL/uL (ref 3.87–5.11)
RDW: 12.7 % (ref 11.5–15.5)
WBC: 6.8 10*3/uL (ref 4.0–10.5)

## 2011-04-25 LAB — SURGICAL PCR SCREEN
MRSA, PCR: NEGATIVE
Staphylococcus aureus: NEGATIVE

## 2011-04-25 LAB — APTT: aPTT: 25 seconds (ref 24–37)

## 2011-04-30 ENCOUNTER — Encounter: Payer: Self-pay | Admitting: Internal Medicine

## 2011-05-02 ENCOUNTER — Ambulatory Visit (HOSPITAL_COMMUNITY): Admission: RE | Admit: 2011-05-02 | Payer: Self-pay | Source: Ambulatory Visit | Admitting: Orthopedic Surgery

## 2011-05-21 ENCOUNTER — Encounter: Payer: Self-pay | Admitting: Internal Medicine

## 2011-05-23 ENCOUNTER — Encounter: Payer: Self-pay | Admitting: Internal Medicine

## 2011-05-23 ENCOUNTER — Ambulatory Visit (INDEPENDENT_AMBULATORY_CARE_PROVIDER_SITE_OTHER): Payer: Self-pay | Admitting: Internal Medicine

## 2011-05-23 DIAGNOSIS — M722 Plantar fascial fibromatosis: Secondary | ICD-10-CM

## 2011-05-23 DIAGNOSIS — M25569 Pain in unspecified knee: Secondary | ICD-10-CM

## 2011-05-23 DIAGNOSIS — S83509A Sprain of unspecified cruciate ligament of unspecified knee, initial encounter: Secondary | ICD-10-CM

## 2011-05-23 MED ORDER — HYDROCODONE-ACETAMINOPHEN 10-500 MG PO TABS: 1.0000 | ORAL_TABLET | Freq: Four times a day (QID) | ORAL | Status: DC | PRN

## 2011-05-23 NOTE — Progress Notes (Signed)
  Subjective:    Patient ID: [name], female    DOB: 06-03-1961, 50 y.o.   MRN: [id number]  HPI [name] is a 50 year old female with past medical history as noted in the chart.  She is here today with new complaint of knee pain and swelling after a traumatic fall where she had a valgus injury. She is complaining of extreme pain and inability to bear weight. There is increased swelling as compared to the other knee.  Review of Systems  Constitutional: Negative for fever, activity change and appetite change.  HENT: Negative for sore throat.   Respiratory: Negative for cough and shortness of breath.   Cardiovascular: Negative for chest pain and leg swelling.  Gastrointestinal: Negative for nausea, abdominal pain, diarrhea, constipation and abdominal distention.  Genitourinary: Negative for frequency, hematuria and difficulty urinating.  Musculoskeletal: Positive for joint swelling and arthralgias.  Neurological: Negative for dizziness and headaches.  Psychiatric/Behavioral: Negative for suicidal ideas and behavioral problems.       Objective:   Physical Exam  Constitutional: She is oriented to person, place, and time. She appears well-developed and well-nourished.  HENT:  Head: Normocephalic and atraumatic.  Eyes: Conjunctivae and EOM are normal. Pupils are equal, round, and reactive to light. No scleral icterus.  Neck: Normal range of motion. Neck supple. No JVD present. No thyromegaly present.  Cardiovascular: Normal rate, regular rhythm, normal heart sounds and intact distal pulses.  Exam reveals no gallop and no friction rub.   No murmur heard. Pulmonary/Chest: Effort normal and breath sounds normal. No respiratory distress. She has no wheezes. She has no rales.  Abdominal: Soft. Bowel sounds are normal. She exhibits no distension and no mass. There is no tenderness. There is no rebound and no guarding.  Musculoskeletal: She exhibits edema and tenderness.       Right  knee: She exhibits decreased range of motion, swelling, effusion, deformity, abnormal alignment, bony tenderness and MCL laxity. tenderness found. Medial joint line and MCL tenderness noted.  Lymphadenopathy:    She has no cervical adenopathy.  Neurological: She is alert and oriented to person, place, and time.  Psychiatric: She has a normal mood and affect. Her behavior is normal.          Assessment & Plan:

## 2011-05-23 NOTE — Assessment & Plan Note (Signed)
Patient has gotten much relief from intralesional injection by Dr. Jennette Kettle. She is not planning to undergo surgery anymore.

## 2011-05-23 NOTE — Patient Instructions (Signed)
Meniscus Tear The meniscus is a C-shaped cartilage structure, located in the knee joint between the thigh bone (femur) and the shinbone (tibia). Two menisci are located in each knee joint: the inner and outer meniscus. The meniscus acts as an adapter between the thigh bone and shinbone, allowing them to fit properly together. It also functions as a shock absorber, to reduce the stress placed on the knee joint and to help supply nutrients to the knee joint cartilage. As people age, the meniscus begins to harden and become more vulnerable to injury. Meniscus tears are a common injury, especially in older athletes. Inner meniscus tears are more common than outer meniscus tears.  SYMPTOMS   Pain in the knee, especially with standing or squatting with the affected leg.   Tenderness along the joint line.   Swelling in the knee joint (effusion), usually starting 1 to 2 days after injury.   Locking or catching of the knee joint, causing inability to straighten the knee completely.   Giving way or buckling of the knee.  CAUSES  A meniscus tear occurs when a force is placed on the meniscus that is greater than it can handle. Common causes of injury include:  Direct hit (trauma) to the knee.   Twisting, pivoting, or cutting (rapidly changing direction while running), kneeling or squatting.   Without injury, due to aging.  RISK INCREASES WITH:  Contact sports (football, rugby).   Sports in which cleats are used with pivoting (soccer, lacrosse) or sports in which good shoe grip and sudden change in direction are required (racquetball, basketball, squash).   Previous knee injury.   Associated knee injury, particularly ligament injuries.   Poor strength and flexibility.  PREVENTION  Warm up and stretch properly before activity.   Maintain physical fitness:   Strength, flexibility, and endurance.   Cardiovascular fitness.   Protect the knee with a brace or elastic bandage.   Wear properly  fitted protective equipment (proper cleats for the surface).  PROGNOSIS  Sometimes, meniscus tears heal on their own. However, definitive treatment requires surgery, followed by at least 6 weeks of recovery.  RELATED COMPLICATIONS   Recurring symptoms that result in a chronic problem.   Repeated knee injury, especially if sports are resumed too soon after injury or surgery.   Progression of the tear (the tear gets larger), if untreated.   Arthritis of the knee in later years (with or without surgery).   Complications of surgery, including infection, bleeding, injury to nerves (numbness, weakness, paralysis) continued pain, giving way, locking, nonhealing of meniscus (if repaired), need for further surgery, and knee stiffness (loss of motion).  TREATMENT  Treatment first involves the use of ice and medicine, to reduce pain and inflammation. You may find using crutches to walk more comfortable. However, it is okay to bear weight on the injured knee, if the pain will allow it. Surgery is often advised as a definitive treatment. Surgery is performed through an incision near the joint (arthroscopically). The torn piece of the meniscus is removed, and if possible the joint cartilage is repaired. After surgery, the joint must be restrained. After restraint, it is important to perform strengthening and stretching exercises to help regain strength and a full range of motion. These exercises may be completed at home or with a therapist.  MEDICATION  If pain medicine is needed, nonsteroidal anti-inflammatory medicines (aspirin and ibuprofen), or other minor pain relievers (acetaminophen), are often advised.   Do not take pain medicine for 7 days  before surgery.   Prescription pain relievers may be given, if your caregiver thinks they are needed. Use only as directed and only as much as you need.  HEAT AND COLD  Cold treatment (icing) should be applied for 10 to 15 minutes every 2 to 3 hours for  inflammation and pain, and immediately after activity that aggravates your symptoms. Use ice packs or an ice massage.   Heat treatment may be used before performing stretching and strengthening activities prescribed by your caregiver, physical therapist, or athletic trainer. Use a heat pack or a warm water soak.  SEEK MEDICAL CARE IF:   Symptoms get worse or do not improve in 2 weeks, despite treatment.   New, unexplained symptoms develop. (Drugs used in treatment may produce side effects.)  EXERCISES RANGE OF MOTION (ROM) AND STRETCHING EXERCISES - Meniscus Tear, Non-operative, Phase I These are some of the initial exercises with which you may start your rehabilitation program, until you see your caregiver again or until your symptoms are resolved. Remember:   These initial exercises are intended to be gentle. They will help you restore motion without increasing any swelling.   Completing these exercises allows less painful movement and prepares you for the more aggressive strengthening exercises in Phase II.   An effective stretch should be held for at least 30 seconds.   A stretch should never be painful. You should only feel a gentle lengthening or release in the stretched tissue.  RANGE OF MOTION - Knee Flexion, Active  Lie on your back with both knees straight. (If this causes back discomfort, bend your healthy knee, placing your foot flat on the floor.)   Slowly slide your heel back toward your buttocks until you feel a gentle stretch in the front of your knee or thigh.   Hold for __________ seconds. Slowly slide your heel back to the starting position.  Repeat __________ times. Complete this exercise __________ times per day.  RANGE OF MOTION - Knee Flexion and Extension, Active-Assisted  Sit on the edge of a table or chair with your thighs firmly supported. It may be helpful to place a folded towel under the end of your right / left thigh.   Flexion (bending): Place the ankle  of your healthy leg on top of the other ankle. Use your healthy leg to gently bend your right / left knee until you feel a mild tension across the top of your knee.   Hold for __________ seconds.   Extension (straightening): Switch your ankles so your right / left leg is on top. Use your healthy leg to straighten your right / left knee until you feel a mild tension on the backside of your knee.   Hold for __________ seconds.  Repeat __________ times. Complete __________ times per day. STRETCH - Knee Flexion, Supine  Lie on the floor with your right / left heel and foot lightly touching the wall. (Place both feet on the wall if you do not use a door frame.)   Without using any effort, allow gravity to slide your foot down the wall slowly until you feel a gentle stretch in the front of your right / left knee.   Hold this stretch for __________ seconds. Then return the leg to the starting position, using your healthy leg for help, if needed.  Repeat __________ times. Complete this stretch __________ times per day.  STRETCH - Knee Extension Sitting  Sit with your right / left leg/heel propped on another chair, coffee  table, or foot stool.   Allow your leg muscles to relax, letting gravity straighten out your knee.*   You should feel a stretch behind your right / left knee. Hold this position for __________ seconds.  Repeat __________ times. Complete this stretch __________ times per day.  *Your physician, physical therapist or athletic trainer may instruct you place a __________ weight on your thigh, just above your kneecap, to deepen the stretch.  STRENGTHENING EXERCISES - Meniscus Tear, Non-operative, Phase I These exercises may help you when beginning to rehabilitate your injury. They may resolve your symptoms with or without further involvement from your physician, physical therapist or athletic trainer. While completing these exercises, remember:   Muscles can gain both the endurance and  the strength needed for everyday activities through controlled exercises.   Complete these exercises as instructed by your physician, physical therapist or athletic trainer. Progress the resistance and repetitions only as guided.  STRENGTH - Quadriceps, Isometrics  Lie on your back with your right / left leg extended and your opposite knee bent.   Gradually tense the muscles in the front of your right / left thigh. You should see either your knee cap slide up toward your hip or increased dimpling just above the knee. This motion will push the back of the knee down toward the floor, mat, or bed on which you are lying.   Hold the muscle as tight as you can, without increasing your pain, for __________ seconds.   Relax the muscles slowly and completely between each repetition.  Repeat __________ times. Complete this exercise __________ times per day.  STRENGTH - Quadriceps, Short Arcs   Lie on your back. Place a __________ inch towel roll under your right / left knee, so that the knee bends slightly.   Raise only your lower leg by tightening the muscles in the front of your thigh. Do not allow your thigh to rise.   Hold this position for __________ seconds.  Repeat __________ times. Complete this exercise __________ times per day.  OPTIONAL ANKLE WEIGHTS: Begin with ____________________, but DO NOT exceed ____________________. Increase in 1 pound/0.5 kilogram increments. STRENGTH - Quadriceps, Straight Leg Raises  Quality counts! Watch for signs that the quadriceps muscle is working, to be sure you are strengthening the correct muscles and not "cheating" by substituting with healthier muscles.  Lay on your back with your right / left leg extended and your opposite knee bent.   Tense the muscles in the front of your right / left thigh. You should see either your knee cap slide up or increased dimpling just above the knee. Your thigh may even shake a bit.   Tighten these muscles even more and  raise your leg 4 to 6 inches off the floor. Hold for __________ seconds.   Keeping these muscles tense, lower your leg.   Relax the muscles slowly and completely in between each repetition.  Repeat __________ times. Complete this exercise __________ times per day.  STRENGTH - Hamstring, Curls   Lay on your stomach with your legs extended. (If you lay on a bed, your feet may hang over the edge.)   Tighten the muscles in the back of your thigh to bend your right / left knee up to 90 degrees. Keep your hips flat on the bed.   Hold this position for __________ seconds.   Slowly lower your leg back to the starting position.  Repeat __________ times. Complete this exercise __________ times per day.  STRENGTH -  Quadriceps, Squats  Stand in a door frame so that your feet and knees are in line with the frame.   Use your hands for balance, not support, on the frame.   Slowly lower your weight, bending at the hips and knees. Keep your lower legs upright so that they are parallel with the door frame. Squat only within the range that does not increase your knee pain. Never let your hips drop below your knees.   Slowly return upright, pushing with your legs, not pulling with your hands.  Repeat __________ times. Complete this exercise __________ times per day.  STRENGTH - Quad/VMO, Isometric   Sit in a chair with your right / left knee slightly bent. With your fingertips, feel the VMO muscle just above the inside of your knee. The VMO is important in controlling the position of your kneecap.   Keeping your fingertips on this muscle. Without actually moving your leg, attempt to drive your knee down as if straightening your leg. You should feel your VMO tense. If you have a difficult time, you may wish to try the same exercise on your healthy knee first.   Tense this muscle as hard as you can without increasing any knee pain.   Hold for __________ seconds. Relax the muscles slowly and completely in  between each repetition.  Repeat __________ times. Complete exercise __________ times per day.  Document Released: 07/16/1998 Document Revised: 03/14/2011 Document Reviewed: 10/14/2008 [facility] Patient Information 2012 [place], [place].

## 2011-05-24 ENCOUNTER — Ambulatory Visit (HOSPITAL_COMMUNITY)
Admission: RE | Admit: 2011-05-24 | Discharge: 2011-05-24 | Disposition: A | Discharge status: Home / Self Care | Payer: Self-pay | Source: Ambulatory Visit | Attending: Internal Medicine | Admitting: Internal Medicine

## 2011-05-24 DIAGNOSIS — X58XXXA Exposure to other specified factors, initial encounter: Secondary | ICD-10-CM | POA: Insufficient documentation

## 2011-05-24 DIAGNOSIS — M25469 Effusion, unspecified knee: Secondary | ICD-10-CM | POA: Insufficient documentation

## 2011-05-24 DIAGNOSIS — IMO0002 Reserved for concepts with insufficient information to code with codable children: Secondary | ICD-10-CM | POA: Insufficient documentation

## 2011-05-24 DIAGNOSIS — M25569 Pain in unspecified knee: Secondary | ICD-10-CM | POA: Insufficient documentation

## 2011-05-25 ENCOUNTER — Ambulatory Visit (INDEPENDENT_AMBULATORY_CARE_PROVIDER_SITE_OTHER): Payer: Self-pay | Admitting: Internal Medicine

## 2011-05-25 DIAGNOSIS — M25569 Pain in unspecified knee: Secondary | ICD-10-CM

## 2011-05-25 NOTE — Progress Notes (Signed)
  Subjective:    Patient ID: [name], female    DOB: [birth year], 50 y.o.   MRN: [id number]  HPI  This is just an order encounter.  Review of Systems     Objective:   Physical Exam        Assessment & Plan:

## 2011-05-25 NOTE — Assessment & Plan Note (Signed)
The MRI results were reviewed and she does have a medial meniscus tear along with sprain of her MCL. I will start her on PT and see if she can improve with that as there is no urgent indication of surgical referral at this time.

## 2011-05-28 NOTE — Progress Notes (Signed)
Referral faxed on 05/25/11 to [facility].Criss Alvine, Sims Laday Cassady11/12/201211:00 AM

## 2011-05-29 ENCOUNTER — Other Ambulatory Visit: Payer: Self-pay | Admitting: *Deleted

## 2011-05-30 MED ORDER — LIRAGLUTIDE 18 MG/3ML ~~LOC~~ SOLN: SUBCUTANEOUS | Status: DC

## 2011-05-31 NOTE — Telephone Encounter (Signed)
Called to pharm 

## 2011-06-01 ENCOUNTER — Other Ambulatory Visit: Payer: Self-pay | Admitting: *Deleted

## 2011-06-01 DIAGNOSIS — M722 Plantar fascial fibromatosis: Secondary | ICD-10-CM

## 2011-06-01 MED ORDER — HYDROCODONE-ACETAMINOPHEN 10-500 MG PO TABS: 1.0000 | ORAL_TABLET | Freq: Four times a day (QID) | ORAL | Status: DC | PRN

## 2011-06-01 MED ORDER — TRAMADOL HCL 50 MG PO TABS: ORAL_TABLET | ORAL | Status: DC

## 2011-06-01 NOTE — Telephone Encounter (Signed)
Possibly change to 90, pt states she has a torn meniscus

## 2011-06-01 NOTE — Telephone Encounter (Signed)
Called to pharm 

## 2011-06-11 ENCOUNTER — Ambulatory Visit (INDEPENDENT_AMBULATORY_CARE_PROVIDER_SITE_OTHER): Payer: Self-pay | Admitting: Internal Medicine

## 2011-06-11 ENCOUNTER — Encounter: Payer: Self-pay | Admitting: Internal Medicine

## 2011-06-11 VITALS — BP 117/80 | HR 88 | Temp 97.4°F | Wt 207.5 lb

## 2011-06-11 DIAGNOSIS — M25569 Pain in unspecified knee: Secondary | ICD-10-CM

## 2011-06-11 DIAGNOSIS — E785 Hyperlipidemia, unspecified: Secondary | ICD-10-CM

## 2011-06-11 DIAGNOSIS — Z0000 Encounter for general adult medical examination without abnormal findings: Secondary | ICD-10-CM

## 2011-06-11 DIAGNOSIS — E119 Type 2 diabetes mellitus without complications: Secondary | ICD-10-CM

## 2011-06-11 DIAGNOSIS — Z23 Encounter for immunization: Secondary | ICD-10-CM

## 2011-06-11 LAB — GLUCOSE, CAPILLARY: Glucose-Capillary: 272 mg/dL — ABNORMAL HIGH (ref 70–99)

## 2011-06-11 LAB — POCT GLYCOSYLATED HEMOGLOBIN (HGB A1C): Hemoglobin A1C: 8.6

## 2011-06-11 MED ORDER — GLIPIZIDE 10 MG PO TABS: 10.0000 mg | ORAL_TABLET | Freq: Two times a day (BID) | ORAL | Status: DC

## 2011-06-11 NOTE — Patient Instructions (Signed)
Please schedule a follow up appointment in 1- 2 months. Please get your meter and medication bottles with your next appointment.

## 2011-06-11 NOTE — Progress Notes (Signed)
  Subjective:    Patient ID: [name], female    DOB: [birth year], 50 y.o.   MRN: [id number]  HPI: 50 year old woman with past medical history significant for type 2 diabetes mellitus, hypertension, hyperlipidemia comes to the clinic for a followup visit.  She states that she has not been taking her diabetes medication for last 1 month at least. She states that she took victoza for 1 month and then was taking metformin intermittently but then completely stopped as it was caused her sick on the stomach again. She states that her blood sugars have been running in 200's , last one being 220 but she forgot her meter today. Otherwise denies any hypoglycemic events. Also denies any fever, chills, cough, weakness or numbness.  She states that she was sick for last 2 days - had some abdominal pain, nausea avomiting and diarrhea but reports feeling better today.    Review of Systems  Constitutional: Negative for fever, diaphoresis, appetite change and fatigue.  HENT: Negative for ear pain, congestion, sneezing, neck pain and postnasal drip.   Eyes: Negative for visual disturbance.  Respiratory: Negative for apnea, cough, choking, chest tightness and stridor.   Cardiovascular: Negative for chest pain, palpitations and leg swelling.  Gastrointestinal: Negative for abdominal pain.  Genitourinary: Negative for flank pain, pelvic pain and dyspareunia.  Musculoskeletal: Negative for arthralgias.  Neurological: Negative for dizziness, seizures, facial asymmetry, light-headedness, numbness and headaches.  Hematological: Negative for adenopathy.       Objective:   Physical Exam  Constitutional: She is oriented to person, place, and time. She appears well-developed and well-nourished. No distress.  HENT:  Head: Normocephalic and atraumatic.  Mouth/Throat: No oropharyngeal exudate.  Eyes: Conjunctivae and EOM are normal. Pupils are equal, round, and reactive to light.  Neck: Normal range of  motion. Neck supple. No JVD present. No tracheal deviation present. No thyromegaly present.  Cardiovascular: Normal rate, regular rhythm, normal heart sounds and intact distal pulses.  Exam reveals no gallop and no friction rub.   No murmur heard. Pulmonary/Chest: Effort normal and breath sounds normal. No stridor. No respiratory distress. She has no wheezes. She has no rales. She exhibits no tenderness.  Abdominal: Soft. Bowel sounds are normal. She exhibits no distension. There is no tenderness. There is no rebound and no guarding.  Musculoskeletal: Normal range of motion. She exhibits no edema and no tenderness.  Lymphadenopathy:    She has no cervical adenopathy.  Neurological: She is alert and oriented to person, place, and time. She has normal reflexes. She displays normal reflexes. No cranial nerve deficit. Coordination normal.  Skin: Skin is warm. She is not diaphoretic.          Assessment & Plan:

## 2011-06-12 NOTE — Progress Notes (Signed)
Glipizide rx called to [facility] pharmacy; e-prescribe not working yesterday.

## 2011-06-14 ENCOUNTER — Ambulatory Visit: Payer: Self-pay

## 2011-06-14 ENCOUNTER — Ambulatory Visit: Payer: No Typology Code available for payment source | Attending: Internal Medicine

## 2011-06-21 NOTE — Assessment & Plan Note (Signed)
She reports feeling better. Stable exam. Plan: Continue physical therapy.

## 2011-06-21 NOTE — Assessment & Plan Note (Signed)
Lab Results  Component Value Date   HGBA1C 8.6 06/11/2011   HGBA1C 9.0 07/26/2010   CREATININE 0.51 04/25/2011   MICROALBUR 0.50 02/15/2011   MICRALBCREAT 3.4 02/15/2011   CHOL 238* 07/26/2010   HDL 57 07/26/2010   TRIG 146 07/26/2010     Assessment: I am extremely concerned about her behavior and medication non compliance. She states that she was able to get Victoza supply for 1 month since her last visit about 3 months ago in 08/12 but then she could not get that medicine further. She states that she was not even taking metformin as it caused her symptoms of nausea and vomiting, but she never informed us. Although her AIC is improved from last time but I am very worried for her. Diabetes control: not controlled Progress toward goals: improved Barriers to meeting goals: nonadherence to medications  Plan: Diabetes treatment: I will start her on glipizide as she could not tolerate metformin in the interim till she can get approval for victoza through MAP again. She was emphasized on the importance of  Medication adherence to keep her blood sugars under good control. She was educated on the organ system damage that can result from poorly controlled diabetes. Refer to: none Instruction/counseling given: reminded to get eye exam, reminded to bring blood glucose meter & log to each visit, reminded to bring medications to each visit, discussed foot care, discussed the need for weight loss and discussed diet

## 2011-06-21 NOTE — Assessment & Plan Note (Addendum)
Continue pravastatin. Recheck FLP  With next visit.

## 2011-06-21 NOTE — Assessment & Plan Note (Signed)
She got  Flu - shot today.

## 2011-06-25 ENCOUNTER — Other Ambulatory Visit: Payer: Self-pay | Admitting: *Deleted

## 2011-06-25 MED ORDER — HYDROCODONE-ACETAMINOPHEN 10-500 MG PO TABS: 1.0000 | ORAL_TABLET | Freq: Four times a day (QID) | ORAL | Status: DC | PRN

## 2011-06-25 NOTE — Telephone Encounter (Signed)
Rx called in and pt informed.   Will forward to Dr Dorthula Rue for referral to PT

## 2011-06-25 NOTE — Telephone Encounter (Signed)
Pt request refill for knee pain

## 2011-06-25 NOTE — Telephone Encounter (Signed)
Pt was given referral for  PT on knee, appointment  11/29 and was not able to keep appointment . She called them and they were to make another appointment and they forgot. She has been rescheduled for 12/18 but needs a NEW referral. Can you please place the order?

## 2011-07-03 ENCOUNTER — Ambulatory Visit: Payer: Self-pay

## 2011-07-04 ENCOUNTER — Ambulatory Visit: Payer: Self-pay | Admitting: Rehabilitation

## 2011-07-18 NOTE — Telephone Encounter (Signed)
Addended by: Neomia Dear on: 07/18/2011 01:21 PM   Modules accepted: Orders

## 2011-07-23 ENCOUNTER — Other Ambulatory Visit: Payer: Self-pay | Admitting: *Deleted

## 2011-07-24 NOTE — Telephone Encounter (Signed)
appt scheduled per chilonb. For 1/10 at 1345, dr Yaakov Guthrie

## 2011-07-24 NOTE — Telephone Encounter (Signed)
Patient needs an appointment

## 2011-07-26 ENCOUNTER — Ambulatory Visit (INDEPENDENT_AMBULATORY_CARE_PROVIDER_SITE_OTHER): Payer: Self-pay | Admitting: Internal Medicine

## 2011-07-26 ENCOUNTER — Encounter: Payer: Self-pay | Admitting: Internal Medicine

## 2011-07-26 VITALS — BP 118/75 | HR 78 | Temp 97.1°F | Wt 213.4 lb

## 2011-07-26 DIAGNOSIS — E119 Type 2 diabetes mellitus without complications: Secondary | ICD-10-CM

## 2011-07-26 DIAGNOSIS — M25569 Pain in unspecified knee: Secondary | ICD-10-CM

## 2011-07-26 DIAGNOSIS — E785 Hyperlipidemia, unspecified: Secondary | ICD-10-CM

## 2011-07-26 DIAGNOSIS — IMO0002 Reserved for concepts with insufficient information to code with codable children: Secondary | ICD-10-CM

## 2011-07-26 LAB — GLUCOSE, CAPILLARY: Glucose-Capillary: 354 mg/dL — ABNORMAL HIGH (ref 70–99)

## 2011-07-26 MED ORDER — HYDROCODONE-ACETAMINOPHEN 5-500 MG PO TABS: 1.0000 | ORAL_TABLET | Freq: Three times a day (TID) | ORAL | Status: DC | PRN

## 2011-07-26 MED ORDER — IBUPROFEN 600 MG PO TABS: 600.0000 mg | ORAL_TABLET | Freq: Four times a day (QID) | ORAL | Status: DC | PRN

## 2011-07-26 MED ORDER — IBUPROFEN 600 MG PO TABS: 600.0000 mg | ORAL_TABLET | Freq: Four times a day (QID) | ORAL | Status: AC | PRN

## 2011-07-26 MED ORDER — HYDROCODONE-ACETAMINOPHEN 10-325 MG PO TABS: 1.0000 | ORAL_TABLET | Freq: Three times a day (TID) | ORAL | Status: DC | PRN

## 2011-07-26 NOTE — Patient Instructions (Signed)
Return to clinic in 4-6 weeks for follow-up of your knee pain and diabetes

## 2011-07-26 NOTE — Assessment & Plan Note (Signed)
MRI in November confirmed tear in medial meniscus.  She has continued to be symptomatic for three months now, requiring vicodin for pain relief.  Was unable to get to PT because of car trouble and obligations taking care of her paraplegic husband and quadriplegic son.  Tried ibuprofen soon after the injury with little efficacy.  Represcribed ibuprofen now to try that she is past acute phase.  Also refilled Vicodin, 10-325mg  #45 filled today.  Asked that she try to minimize Vicodin use, especially if ibuprofen seems to work some.  Will refer to orthopaedic surgery.

## 2011-07-26 NOTE — Progress Notes (Signed)
Subjective:   Patient ID: [name] female   DOB: 1960-11-22 51 y.o.   MRN: [id number]  HPI: [name] is a 51 y.o. with multiple knee arthroscopies for meniscus injuries who presents for follow-up for R knee pain following a fall carrying some stuff 3-4 months ago.  She was seen previous and MRI found medial meniscus tear and mild MCL sprain.  She was referred to physical therapy but never went because her Zenaida Niece was not working and she has to care for her paraplegic husband and quadriplegic son.  Caring for her family requires a lot of transfers, lifting, squatting, etc.  She has been prescribed Vicodin three times by [name] for knee pain and she also has tramadol.    She also has diabetes.  She was prescribed Victoza last time and the med assistance program through county pharmacy was getting it for her.  She was taking glipizide in the mean while until she got this medicine.  She has not heard from the county pharmacy about this and does not have the patience to make it past the automated messages when she calls them.  She also has not taken glipizide and pravastatin for 1-2 weeks because she was out of town and busy over the holidays after she ran out of these medicines around Christmas.      Past Medical History  Diagnosis Date  . Adhesive capsulitis of right shoulder     with underlying tendinopathy rotator cuff  . History of post-sterilization tuboplasty 2000  . Tear of meniscus of left knee     x2  . Tear of meniscus of right knee   . Plantar fasciitis     Right   Current Outpatient Prescriptions  Medication Sig Dispense Refill  . Blood Glucose Monitoring Suppl (WAVESENSE PRESTO) W/DEVICE KIT 1 each by Does not apply route once.  1 each  0  . diclofenac (VOLTAREN) 75 MG EC tablet Take 1 tablet (75 mg total) by mouth 2 (two) times daily. With food.  30 tablet  0  . glipiZIDE (GLUCOTROL) 10 MG tablet Take 1 tablet (10 mg total) by mouth 2 (two) times daily before a  meal.  60 tablet  1  . glucose blood test strip Use as instructed  100 each  12  . HYDROcodone-acetaminophen (NORCO) 10-325 MG per tablet Take 1 tablet by mouth every 8 (eight) hours as needed for pain.  45 tablet  0  . ibuprofen (ADVIL,MOTRIN) 600 MG tablet Take 1 tablet (600 mg total) by mouth every 6 (six) hours as needed for pain.  30 tablet  0  . Insulin Pen Needle (B-D UF III MINI PEN NEEDLES) 31G X 5 MM MISC 1 each by Does not apply route daily.  100 each  3  . Liraglutide (VICTOZA) 18 MG/3ML SOLN Take 0.6 mg once a day for 1 week and then increase it to 1.2 mg daily from second week.  6 mL  6  . pravastatin (PRAVACHOL) 40 MG tablet Take 1 tablet (40 mg total) by mouth daily.  30 tablet  11  . traMADol (ULTRAM) 50 MG tablet 1-2 tabs by mouth qhs as needed for severe pain.  45 tablet  0   Family History  Problem Relation Age of Onset  . Cancer Sister     both sisters have breast cancer   History   Social History  . Marital Status: Married    Spouse Name: N/A    Number of Children: N/A  .  Years of Education: N/A   Social History Main Topics  . Smoking status: Never Smoker   . Smokeless tobacco: Never Used  . Alcohol Use: No  . Drug Use: No  . Sexually Active: None   Other Topics Concern  . None   Social History Narrative   Married, unemployed, Husband disabled and paraplegic 2/2 fall from tree stand while deer hunting, Son quadriplegic 2/2 MVA 05/2007 in [place]   Review of Systems: Constitutional: Denies fever, chills Respiratory: Denies SOB, DOE, cough, chest tightness,  and wheezing.   Cardiovascular: Denies chest pain, palpitations and leg swelling.  Musculoskeletal: see HPI Skin: Denies pallor, rash and wound.  .    Objective:  Physical Exam: Filed Vitals:   07/26/11 1406  BP: 118/75  Pulse: 78  Temp: 97.1 F (36.2 C)  TempSrc: Oral  Weight: 213 lb 6.4 oz (96.798 kg)  SpO2: 99%   Constitutional: Vital signs reviewed.  Patient is a well-developed and  well-nourished female in no acute distress and cooperative with exam. Alert and oriented x3.  Head: Normocephalic and atraumatic  Mouth: no erythema or exudates, MMM Eyes: PERRL, EOMI, conjunctivae normal, No scleral icterus.  Neck: No JVD  Cardiovascular: RRR, S1 normal, S2 normal, no MRG, pulses symmetric and intact bilaterally Pulmonary/Chest: CTAB, no wheezes, rales, or rhonchi Abdominal: Soft. Non-tender, non-distended, bowel sounds are normal, no masses, organomegaly, or guarding present.   MSK:  R knee: Pain with extension beyond 30 degrees of flexion, cannot quite extend knee all the way.  Tender to palpation along joint line medially.  Grind test causes mild pain medially.  No effusion.  No swelling.  No erythema.    L knee: Mild pain with full extension.  No tenderness to palpation.  Negative grind test.  No effusion.  No swelling. No erythema.    Neurological: A&O x3, Strength is normal and symmetric bilaterally in upper extremities, limited in R leg by knee pain. Cranial nerve II-XII are grossly intact, no focal motor deficit, sensory intact to light touch bilaterally including below knees.  Skin: Warm, dry and intact. No rash, cyanosis, or clubbing.    Assessment & Plan:

## 2011-07-26 NOTE — Assessment & Plan Note (Signed)
Ran out of stain around Christmas and has not refilled since because busy and out of town.  Instructed her to refill this ASAP. She is due for a lipid panel but there is no point in checking if she has been off therapy.  Will recheck next visit if she has been taking her statin.

## 2011-07-26 NOTE — Assessment & Plan Note (Addendum)
Has not yet filled Liraglutide- we called county pharmacy and this is available for her to pick up now.  She was taking glipizide in mean while, but has not taken since around Christmas due to being busy and out of town.  Since she has been off meds will not evaluate sugars at this time.  Instructed her to pickup liraglutide asap.  Follow-up 4-6 weeks for diabetes check once back on medication.

## 2011-08-23 NOTE — Progress Notes (Signed)
Ms. Raabe' history and physical were reviewed with Dr. Yaakov Guthrie at the time of the encounter and the assessment and plan were formulated together.  I agree with his documentation.

## 2011-08-27 ENCOUNTER — Other Ambulatory Visit: Payer: Self-pay | Admitting: *Deleted

## 2011-08-27 DIAGNOSIS — M25569 Pain in unspecified knee: Secondary | ICD-10-CM

## 2011-08-27 MED ORDER — HYDROCODONE-ACETAMINOPHEN 10-325 MG PO TABS: 1.0000 | ORAL_TABLET | Freq: Three times a day (TID) | ORAL | Status: DC | PRN

## 2011-08-27 NOTE — Telephone Encounter (Signed)
Prescription for Hydrocodone-Acetaminophen called to [facility] on [facility] [address] [facility] [address] with no refills.  Take 1 tablet by mouth every 8 hours as needed for pain.  Angelina Ok, RN 08/27/2011. 1:30 PM.

## 2011-08-27 NOTE — Telephone Encounter (Signed)
Rx called into [facility] out pt pharmacy.  Rx canceled at [facility]

## 2011-08-30 ENCOUNTER — Encounter: Payer: Self-pay | Admitting: Internal Medicine

## 2011-08-30 ENCOUNTER — Ambulatory Visit (INDEPENDENT_AMBULATORY_CARE_PROVIDER_SITE_OTHER): Payer: Self-pay | Admitting: Internal Medicine

## 2011-08-30 VITALS — BP 114/76 | HR 78 | Temp 96.8°F | Ht 62.0 in | Wt 207.9 lb

## 2011-08-30 DIAGNOSIS — E785 Hyperlipidemia, unspecified: Secondary | ICD-10-CM

## 2011-08-30 DIAGNOSIS — M25569 Pain in unspecified knee: Secondary | ICD-10-CM

## 2011-08-30 DIAGNOSIS — Z79899 Other long term (current) drug therapy: Secondary | ICD-10-CM

## 2011-08-30 DIAGNOSIS — E119 Type 2 diabetes mellitus without complications: Secondary | ICD-10-CM

## 2011-08-30 LAB — LIPID PANEL
Cholesterol: 181 mg/dL (ref 0–200)
HDL: 41 mg/dL (ref >39)
LDL Cholesterol: 113 mg/dL — ABNORMAL HIGH (ref 0–99)
Total CHOL/HDL Ratio: 4.4 Ratio
Triglycerides: 136 mg/dL (ref <150)
VLDL: 27 mg/dL (ref 0–40)

## 2011-08-30 LAB — POCT GLYCOSYLATED HEMOGLOBIN (HGB A1C): Hemoglobin A1C: 8.6

## 2011-08-30 LAB — GLUCOSE, CAPILLARY: Glucose-Capillary: 170 mg/dL — ABNORMAL HIGH (ref 70–99)

## 2011-08-30 MED ORDER — LIRAGLUTIDE 18 MG/3ML ~~LOC~~ SOLN: SUBCUTANEOUS | Status: DC

## 2011-08-30 MED ORDER — HYDROCODONE-ACETAMINOPHEN 10-325 MG PO TABS: 1.0000 | ORAL_TABLET | Freq: Three times a day (TID) | ORAL | Status: DC | PRN

## 2011-08-30 NOTE — Assessment & Plan Note (Signed)
MRI in November confirmed tear in medial meniscus. She has continued to be symptomatic for three months now, requiring vicodin for pain relief. She was referred to orthopedic surgery during last clinic visit but unfortunately she hasn't heard back from them.  - Followup on her referral and try to expedite her appointment date and time with orthopedic surgery. - For time being, I would give her refills on Vicodin. She was given a refill of vicodin ( 10-325)# 40x0. She was reemphasized on the side effects and possible addiction.   Update- She has an appointment with Dr. Lajoyce Corners at [facility] orthopedic on Monday, February 18 at 1:30 PM.

## 2011-08-30 NOTE — Assessment & Plan Note (Signed)
Lab Results  Component Value Date   HGBA1C 8.6 08/30/2011   HGBA1C 9.0 07/26/2010   CREATININE 0.51 04/25/2011   MICROALBUR 0.50 02/15/2011   MICRALBCREAT 3.4 02/15/2011   CHOL 238* 07/26/2010   HDL 57 07/26/2010   TRIG 146 07/26/2010    Last eye exam and foot exam: No results found for this basename: HMDIABEYEEXA, HMDIABFOOTEX    Assessment: Diabetes control: controlled Progress toward goals:not at goal Barriers to meeting goals: no barriers identified  Plan: Diabetes treatment: Increase victoza from 1.2 to 1.8 daily. Refer to: none Instruction/counseling given: reminded to bring blood glucose meter & log to each visit, reminded to bring medications to each visit, discussed foot care, discussed the need for weight loss and discussed diet

## 2011-08-30 NOTE — Assessment & Plan Note (Signed)
Check lipid panel and CMET. 

## 2011-08-30 NOTE — Patient Instructions (Signed)
Please schedule a follow up appointment in 2-3 months or earlier if needed. Please bring your medication bottles with your next appointment. Please take your medicines as prescribed. Please bring your glucometer with your next appointment. I will call you with your lab results if anything will be abnormal.

## 2011-08-30 NOTE — Progress Notes (Signed)
  Subjective:    Patient ID: [name], female    DOB: Jan 03, 1961, 51 y.o.   MRN: [id number]  HPI: 51 year old woman with past medical history significant for type 2 diabetes,, hyperlipidemia, medial meniscus injury about 3-4 months ago comes to the clinic for a followup visit.  1) Right knee pain from medial meniscus injury: She states that her knee pain is getting more worse. She was referred to physical therapy but never went because her Zenaida Niece was not working and she has to care for her paraplegic husband and quadriplegic son. Caring for her family requires a lot of transfers, lifting, squatting, etc.  She was scheduled for an orthopedic surgery appointment during last visit for the further evaluation on her meniscal injury but she hasn't heard back from them. She has been prescribed Vicodin from us-she states that she takes on an average I to 2 pills per day, mostly in the evening when her pain is unbearable.  2) Diabetes: States that her blood sugars have been running mostly in the 190s to 200s this morning she had a blood sugar of 155. Denies any hypoglycemic events.    Review of Systems  Constitutional: Negative for fever and fatigue.  HENT: Negative for nosebleeds, congestion, rhinorrhea and postnasal drip.   Respiratory: Negative for cough, shortness of breath, wheezing and stridor.   Musculoskeletal: Negative for arthralgias.  Neurological: Negative for seizures, speech difficulty, numbness and headaches.  Psychiatric/Behavioral: Negative for agitation.       Objective:   Physical Exam  Constitutional: She is oriented to person, place, and time. She appears well-developed and well-nourished. No distress.  HENT:  Head: Normocephalic and atraumatic.  Mouth/Throat: No oropharyngeal exudate.  Eyes: Conjunctivae and EOM are normal. Pupils are equal, round, and reactive to light.  Neck: Normal range of motion. Neck supple. No JVD present. No tracheal deviation present. No  thyromegaly present.  Cardiovascular: Normal rate, regular rhythm and intact distal pulses.  Exam reveals no gallop and no friction rub.   No murmur heard. Pulmonary/Chest: Effort normal. No stridor. No respiratory distress. She has no wheezes. She has no rales.  Abdominal: Soft. Bowel sounds are normal. She exhibits no distension. There is no tenderness. There is no rebound and no guarding.  Musculoskeletal: Normal range of motion.       Right knee- no visible swelling , effusion or erythema tender to palpation in the medial aspect of joint line, Painful external rotation and extension.  Lymphadenopathy:    She has no cervical adenopathy.  Neurological: She is alert and oriented to person, place, and time. She has normal reflexes. No cranial nerve deficit. Coordination normal.  Skin: She is not diaphoretic.          Assessment & Plan:

## 2011-08-31 LAB — COMPLETE METABOLIC PANEL WITH GFR
ALT: 19 U/L (ref 0–35)
AST: 19 U/L (ref 0–37)
Albumin: 3.8 g/dL (ref 3.5–5.2)
Alkaline Phosphatase: 87 U/L (ref 39–117)
BUN: 10 mg/dL (ref 6–23)
CO2: 22 mEq/L (ref 19–32)
Calcium: 9.1 mg/dL (ref 8.4–10.5)
Chloride: 103 mEq/L (ref 96–112)
Creat: 0.72 mg/dL (ref 0.50–1.10)
GFR, Est African American: >89 mL/min
GFR, Est Non African American: >89 mL/min
Glucose, Bld: 206 mg/dL — ABNORMAL HIGH (ref 70–99)
Potassium: 4 mEq/L (ref 3.5–5.3)
Sodium: 137 mEq/L (ref 135–145)
Total Bilirubin: 0.7 mg/dL (ref 0.3–1.2)
Total Protein: 6.8 g/dL (ref 6.0–8.3)

## 2011-09-21 ENCOUNTER — Other Ambulatory Visit: Payer: Self-pay | Admitting: *Deleted

## 2011-09-23 MED ORDER — HYDROCODONE-ACETAMINOPHEN 10-500 MG PO TABS: 1.0000 | ORAL_TABLET | Freq: Four times a day (QID) | ORAL | Status: DC | PRN

## 2011-09-25 ENCOUNTER — Encounter (HOSPITAL_COMMUNITY): Payer: Self-pay | Admitting: Pharmacy Technician

## 2011-09-25 ENCOUNTER — Other Ambulatory Visit (HOSPITAL_COMMUNITY): Payer: Self-pay | Admitting: Orthopedic Surgery

## 2011-09-25 NOTE — Telephone Encounter (Signed)
Rx called to pharmacy

## 2011-09-26 ENCOUNTER — Encounter (HOSPITAL_COMMUNITY)
Admission: RE | Admit: 2011-09-26 | Discharge: 2011-09-26 | Disposition: A | Discharge status: Home / Self Care | Payer: Self-pay | Source: Ambulatory Visit | Attending: Orthopedic Surgery | Admitting: Orthopedic Surgery

## 2011-09-26 ENCOUNTER — Other Ambulatory Visit: Payer: Self-pay | Admitting: *Deleted

## 2011-09-26 ENCOUNTER — Encounter (HOSPITAL_COMMUNITY): Payer: Self-pay

## 2011-09-26 DIAGNOSIS — M25569 Pain in unspecified knee: Secondary | ICD-10-CM

## 2011-09-26 HISTORY — DX: Gastro-esophageal reflux disease without esophagitis: K21.9

## 2011-09-26 HISTORY — DX: Shortness of breath: R06.02

## 2011-09-26 HISTORY — DX: Unspecified osteoarthritis, unspecified site: M19.90

## 2011-09-26 HISTORY — DX: Sleep apnea, unspecified: G47.30

## 2011-09-26 LAB — COMPREHENSIVE METABOLIC PANEL
ALT: 17 U/L (ref 0–35)
AST: 18 U/L (ref 0–37)
Albumin: 3.3 g/dL — ABNORMAL LOW (ref 3.5–5.2)
Alkaline Phosphatase: 85 U/L (ref 39–117)
BUN: 8 mg/dL (ref 6–23)
CO2: 28 mEq/L (ref 19–32)
Calcium: 9.2 mg/dL (ref 8.4–10.5)
Chloride: 102 mEq/L (ref 96–112)
Creatinine, Ser: 0.51 mg/dL (ref 0.50–1.10)
GFR calc Af Amer: >90 mL/min (ref >90)
GFR calc non Af Amer: >90 mL/min (ref >90)
Glucose, Bld: 239 mg/dL — ABNORMAL HIGH (ref 70–99)
Potassium: 3.9 mEq/L (ref 3.5–5.1)
Sodium: 137 mEq/L (ref 135–145)
Total Bilirubin: 0.3 mg/dL (ref 0.3–1.2)
Total Protein: 6.7 g/dL (ref 6.0–8.3)

## 2011-09-26 LAB — CBC
HCT: 40.8 % (ref 36.0–46.0)
Hemoglobin: 14.1 g/dL (ref 12.0–15.0)
MCH: 29 pg (ref 26.0–34.0)
MCHC: 34.6 g/dL (ref 30.0–36.0)
MCV: 84 fL (ref 78.0–100.0)
Platelets: 299 10*3/uL (ref 150–400)
RBC: 4.86 MIL/uL (ref 3.87–5.11)
RDW: 12.4 % (ref 11.5–15.5)
WBC: 8.5 10*3/uL (ref 4.0–10.5)

## 2011-09-26 LAB — SURGICAL PCR SCREEN
MRSA, PCR: NEGATIVE
Staphylococcus aureus: NEGATIVE

## 2011-09-26 LAB — PROTIME-INR
INR: 0.94 (ref 0.00–1.49)
Prothrombin Time: 12.8 seconds (ref 11.6–15.2)

## 2011-09-26 LAB — APTT: aPTT: 25 seconds (ref 24–37)

## 2011-09-26 MED ORDER — CEFAZOLIN SODIUM 1-5 GM-% IV SOLN: 1.0000 g | INTRAVENOUS | Status: DC

## 2011-09-26 MED ORDER — HYDROCODONE-ACETAMINOPHEN 10-325 MG PO TABS: 1.0000 | ORAL_TABLET | Freq: Three times a day (TID) | ORAL | Status: DC | PRN

## 2011-09-26 NOTE — Pre-Procedure Instructions (Signed)
20 [name]  09/26/2011   Your procedure is scheduled on:  March 27,2013 wednesday  Report to [facility] at 0630 am AM.  Call this number if you have problems the morning of surgery: [phone]   Remember:   Do not eat food:After Midnight.  May have clear liquids: up to 4 Hours before arrival.do not drink anything after 0230 am the day of surgery.   Clear liquids include soda, tea, black coffee, apple or grape juice, broth.  Take these medicines the morning of surgery with A SIP OF WATER: vicodin  ultram   Do not wear jewelry, make-up or nail polish.  Do not wear lotions, powders, or perfumes. You may wear deodorant.  Do not shave 48 hours prior to surgery.  Do not bring valuables to the hospital.  Contacts, dentures or bridgework may not be worn into surgery.  Leave suitcase in the car. After surgery it may be brought to your room.  For patients admitted to the hospital, checkout time is 11:00 AM the day of discharge.   Patients discharged the day of surgery will not be allowed to drive home.  Name and phone number of your driver:   Special Instructions: CHG Shower Use Special Wash: 1/2 bottle night before surgery and 1/2 bottle morning of surgery.   Please read over the following fact sheets that you were given: Pain Booklet, Coughing and Deep Breathing, MRSA Information and Surgical Site Infection Prevention

## 2011-09-26 NOTE — Telephone Encounter (Signed)
Pt wants 10/500 script cancelled and wants the 10/325 called to pharm, may we do so? i have paged dr Dorthula Rue and she has ok'ed the 10/325, calling pharm at [facility] and doing so.

## 2011-10-10 ENCOUNTER — Ambulatory Visit (HOSPITAL_COMMUNITY): Payer: Self-pay | Admitting: Certified Registered"

## 2011-10-10 ENCOUNTER — Encounter (HOSPITAL_COMMUNITY): Payer: Self-pay | Admitting: Certified Registered"

## 2011-10-10 ENCOUNTER — Encounter (HOSPITAL_COMMUNITY)
Admission: RE | Disposition: A | Discharge status: Home / Self Care | Payer: Self-pay | Source: Ambulatory Visit | Attending: Orthopedic Surgery

## 2011-10-10 ENCOUNTER — Ambulatory Visit (HOSPITAL_COMMUNITY)
Admission: RE | Admit: 2011-10-10 | Discharge: 2011-10-11 | Disposition: A | Discharge status: Home / Self Care | Payer: Self-pay | Source: Ambulatory Visit | Attending: Orthopedic Surgery | Admitting: Orthopedic Surgery

## 2011-10-10 ENCOUNTER — Encounter (HOSPITAL_COMMUNITY): Payer: Self-pay | Admitting: *Deleted

## 2011-10-10 DIAGNOSIS — M23302 Other meniscus derangements, unspecified lateral meniscus, unspecified knee: Secondary | ICD-10-CM | POA: Insufficient documentation

## 2011-10-10 DIAGNOSIS — M79644 Pain in right finger(s): Secondary | ICD-10-CM

## 2011-10-10 DIAGNOSIS — E785 Hyperlipidemia, unspecified: Secondary | ICD-10-CM

## 2011-10-10 DIAGNOSIS — IMO0002 Reserved for concepts with insufficient information to code with codable children: Secondary | ICD-10-CM

## 2011-10-10 DIAGNOSIS — R0602 Shortness of breath: Secondary | ICD-10-CM | POA: Insufficient documentation

## 2011-10-10 DIAGNOSIS — K219 Gastro-esophageal reflux disease without esophagitis: Secondary | ICD-10-CM | POA: Insufficient documentation

## 2011-10-10 DIAGNOSIS — E119 Type 2 diabetes mellitus without complications: Secondary | ICD-10-CM | POA: Insufficient documentation

## 2011-10-10 DIAGNOSIS — M23329 Other meniscus derangements, posterior horn of medial meniscus, unspecified knee: Secondary | ICD-10-CM | POA: Insufficient documentation

## 2011-10-10 HISTORY — PX: KNEE ARTHROSCOPY: SHX127

## 2011-10-10 LAB — GLUCOSE, CAPILLARY
Glucose-Capillary: 303 mg/dL — ABNORMAL HIGH (ref 70–99)
Glucose-Capillary: 237 mg/dL — ABNORMAL HIGH (ref 70–99)
Glucose-Capillary: 233 mg/dL — ABNORMAL HIGH (ref 70–99)
Glucose-Capillary: 196 mg/dL — ABNORMAL HIGH (ref 70–99)
Glucose-Capillary: 239 mg/dL — ABNORMAL HIGH (ref 70–99)
Glucose-Capillary: 303 mg/dL — ABNORMAL HIGH (ref 70–99)

## 2011-10-10 LAB — HCG, SERUM, QUALITATIVE: Preg, Serum: NEGATIVE

## 2011-10-10 SURGERY — ARTHROSCOPY, KNEE
Anesthesia: General | Site: Knee | Laterality: Right | Wound class: Clean

## 2011-10-10 MED ORDER — SODIUM CHLORIDE 0.9 % IV SOLN: INTRAVENOUS | Status: DC

## 2011-10-10 MED ORDER — INSULIN ASPART 100 UNIT/ML ~~LOC~~ SOLN
SUBCUTANEOUS | Status: AC
  Administered 2011-10-10: 5 [IU] via SUBCUTANEOUS
  Filled 2011-10-10: qty 1

## 2011-10-10 MED ORDER — OXYCODONE-ACETAMINOPHEN 10-325 MG PO TABS: 1.0000 | ORAL_TABLET | ORAL | Status: AC | PRN

## 2011-10-10 MED ORDER — FENTANYL CITRATE 0.05 MG/ML IJ SOLN
INTRAMUSCULAR | Status: DC | PRN
  Administered 2011-10-10: 100 ug via INTRAVENOUS
  Administered 2011-10-10 (×2): 25 ug via INTRAVENOUS

## 2011-10-10 MED ORDER — MORPHINE SULFATE 2 MG/ML IJ SOLN: 0.0500 mg/kg | INTRAMUSCULAR | Status: DC | PRN

## 2011-10-10 MED ORDER — ONDANSETRON HCL 4 MG/2ML IJ SOLN
4.0000 mg | Freq: Four times a day (QID) | INTRAMUSCULAR | Status: DC | PRN
  Administered 2011-10-10: 4 mg via INTRAVENOUS
  Filled 2011-10-10: qty 2

## 2011-10-10 MED ORDER — CEFAZOLIN SODIUM 1-5 GM-% IV SOLN
INTRAVENOUS | Status: AC
  Filled 2011-10-10: qty 100

## 2011-10-10 MED ORDER — CEFAZOLIN SODIUM 1-5 GM-% IV SOLN
INTRAVENOUS | Status: DC | PRN
  Administered 2011-10-10: 2 g via INTRAVENOUS

## 2011-10-10 MED ORDER — OXYCODONE-ACETAMINOPHEN 10-325 MG PO TABS: 1.0000 | ORAL_TABLET | Freq: Four times a day (QID) | ORAL | Status: DC | PRN

## 2011-10-10 MED ORDER — INSULIN ASPART 100 UNIT/ML ~~LOC~~ SOLN
5.0000 [IU] | Freq: Once | SUBCUTANEOUS | Status: AC
  Administered 2011-10-10: 5 [IU] via SUBCUTANEOUS
  Filled 2011-10-10: qty 3

## 2011-10-10 MED ORDER — CEFAZOLIN SODIUM-DEXTROSE 2-3 GM-% IV SOLR
2.0000 g | Freq: Four times a day (QID) | INTRAVENOUS | Status: AC
  Administered 2011-10-10 – 2011-10-11 (×3): 2 g via INTRAVENOUS
  Filled 2011-10-10 (×3): qty 50

## 2011-10-10 MED ORDER — HYDROMORPHONE HCL PF 1 MG/ML IJ SOLN
0.5000 mg | INTRAMUSCULAR | Status: DC | PRN
  Administered 2011-10-10 (×3): 1 mg via INTRAVENOUS
  Filled 2011-10-10 (×3): qty 1

## 2011-10-10 MED ORDER — ONDANSETRON HCL 4 MG PO TABS: 4.0000 mg | ORAL_TABLET | Freq: Four times a day (QID) | ORAL | Status: DC | PRN

## 2011-10-10 MED ORDER — OXYCODONE-ACETAMINOPHEN 5-325 MG PO TABS
1.0000 | ORAL_TABLET | Freq: Four times a day (QID) | ORAL | Status: DC | PRN
Start: 2011-10-10 — End: 2011-10-11
  Administered 2011-10-10 – 2011-10-11 (×3): 1 via ORAL
  Filled 2011-10-10 (×3): qty 1

## 2011-10-10 MED ORDER — DICLOFENAC SODIUM 75 MG PO TBEC
75.0000 mg | DELAYED_RELEASE_TABLET | Freq: Two times a day (BID) | ORAL | Status: DC
  Administered 2011-10-10 – 2011-10-11 (×3): 75 mg via ORAL
  Filled 2011-10-10 (×5): qty 1

## 2011-10-10 MED ORDER — MIDAZOLAM HCL 5 MG/5ML IJ SOLN
INTRAMUSCULAR | Status: DC | PRN
  Administered 2011-10-10: 2 mg via INTRAVENOUS

## 2011-10-10 MED ORDER — HYDROMORPHONE HCL PF 1 MG/ML IJ SOLN
0.2500 mg | INTRAMUSCULAR | Status: DC | PRN
  Administered 2011-10-10: 0.25 mg via INTRAVENOUS
  Administered 2011-10-10: 0.5 mg via INTRAVENOUS
  Administered 2011-10-10: 0.25 mg via INTRAVENOUS

## 2011-10-10 MED ORDER — ONDANSETRON HCL 4 MG/2ML IJ SOLN
INTRAMUSCULAR | Status: DC | PRN
  Administered 2011-10-10: 4 mg via INTRAVENOUS

## 2011-10-10 MED ORDER — METOCLOPRAMIDE HCL 5 MG PO TABS
5.0000 mg | ORAL_TABLET | Freq: Three times a day (TID) | ORAL | Status: DC | PRN
  Filled 2011-10-10: qty 2

## 2011-10-10 MED ORDER — OXYCODONE HCL 5 MG PO TABS
5.0000 mg | ORAL_TABLET | Freq: Four times a day (QID) | ORAL | Status: DC | PRN
  Administered 2011-10-10 – 2011-10-11 (×3): 5 mg via ORAL
  Filled 2011-10-10 (×3): qty 1

## 2011-10-10 MED ORDER — METOCLOPRAMIDE HCL 5 MG/ML IJ SOLN: 5.0000 mg | Freq: Three times a day (TID) | INTRAMUSCULAR | Status: DC | PRN

## 2011-10-10 MED ORDER — SIMVASTATIN 5 MG PO TABS
5.0000 mg | ORAL_TABLET | Freq: Every day | ORAL | Status: DC
  Administered 2011-10-10: 5 mg via ORAL
  Filled 2011-10-10 (×3): qty 1

## 2011-10-10 MED ORDER — BUPIVACAINE HCL (PF) 0.25 % IJ SOLN
INTRAMUSCULAR | Status: DC | PRN
  Administered 2011-10-10: 20 mL via INTRA_ARTICULAR

## 2011-10-10 MED ORDER — ONDANSETRON HCL 4 MG/2ML IJ SOLN: 4.0000 mg | Freq: Once | INTRAMUSCULAR | Status: DC | PRN

## 2011-10-10 MED ORDER — SODIUM CHLORIDE 0.9 % IR SOLN
Status: DC | PRN
  Administered 2011-10-10: 1000 mL

## 2011-10-10 MED ORDER — LACTATED RINGERS IV SOLN
INTRAVENOUS | Status: DC | PRN
  Administered 2011-10-10: 08:00:00 via INTRAVENOUS

## 2011-10-10 MED ORDER — PROPOFOL 10 MG/ML IV EMUL
INTRAVENOUS | Status: DC | PRN
  Administered 2011-10-10: 200 mg via INTRAVENOUS

## 2011-10-10 SURGICAL SUPPLY — 33 items
BLADE CUDA 5.5 (BLADE) IMPLANT
BLADE GREAT WHITE 4.2 (BLADE) ×2 IMPLANT
BNDG COHESIVE 6X5 TAN STRL LF (GAUZE/BANDAGES/DRESSINGS) ×2 IMPLANT
BUR OVAL 6.0 (BURR) IMPLANT
CLOTH BEACON ORANGE TIMEOUT ST (SAFETY) ×2 IMPLANT
COVER SURGICAL LIGHT HANDLE (MISCELLANEOUS) ×2 IMPLANT
CUFF TOURNIQUET SINGLE 34IN LL (TOURNIQUET CUFF) IMPLANT
CUFF TOURNIQUET SINGLE 44IN (TOURNIQUET CUFF) IMPLANT
DRAPE ARTHROSCOPY W/POUCH 114 (DRAPES) ×2 IMPLANT
DRAPE U-SHAPE 47X51 STRL (DRAPES) ×2 IMPLANT
DRSG EMULSION OIL 3X3 NADH (GAUZE/BANDAGES/DRESSINGS) ×2 IMPLANT
DRSG PAD ABDOMINAL 8X10 ST (GAUZE/BANDAGES/DRESSINGS) ×2 IMPLANT
DURAPREP 26ML APPLICATOR (WOUND CARE) ×2 IMPLANT
GLOVE SURG ORTHO 9.0 STRL STRW (GLOVE) ×2 IMPLANT
GOWN PREVENTION PLUS XLARGE (GOWN DISPOSABLE) ×2 IMPLANT
GOWN SRG XL XLNG 56XLVL 4 (GOWN DISPOSABLE) ×1 IMPLANT
GOWN STRL NON-REIN XL XLG LVL4 (GOWN DISPOSABLE) ×1
KIT BASIN OR (CUSTOM PROCEDURE TRAY) ×2 IMPLANT
KIT ROOM TURNOVER OR (KITS) ×2 IMPLANT
MANIFOLD NEPTUNE II (INSTRUMENTS) ×2 IMPLANT
NEEDLE 18GX1X1/2 (RX/OR ONLY) (NEEDLE) ×2 IMPLANT
PACK ARTHROSCOPY DSU (CUSTOM PROCEDURE TRAY) ×2 IMPLANT
PAD ARMBOARD 7.5X6 YLW CONV (MISCELLANEOUS) ×4 IMPLANT
PADDING CAST COTTON 6X4 STRL (CAST SUPPLIES) ×2 IMPLANT
SET ARTHROSCOPY TUBING (MISCELLANEOUS) ×1
SET ARTHROSCOPY TUBING LN (MISCELLANEOUS) ×1 IMPLANT
SPONGE GAUZE 4X4 12PLY (GAUZE/BANDAGES/DRESSINGS) ×2 IMPLANT
SUT ETHILON 4 0 PS 2 18 (SUTURE) ×2 IMPLANT
SYR 20CC LL (SYRINGE) ×2 IMPLANT
TOWEL OR 17X24 6PK STRL BLUE (TOWEL DISPOSABLE) ×4 IMPLANT
TUBE CONNECTING 12X1/4 (SUCTIONS) IMPLANT
WAND 90 DEG TURBOVAC W/CORD (SURGICAL WAND) IMPLANT
WATER STERILE IRR 1000ML POUR (IV SOLUTION) ×2 IMPLANT

## 2011-10-10 NOTE — Anesthesia Postprocedure Evaluation (Signed)
  Anesthesia Post-op Note  Patient: [name]  Procedure(s) Performed: Procedure(s) (LRB): ARTHROSCOPY KNEE (Right)  Patient Location: PACU  Anesthesia Type: General  Level of Consciousness: awake, alert  and oriented  Airway and Oxygen Therapy: Patient Spontanous Breathing and Patient connected to nasal cannula oxygen  Post-op Pain: mild  Post-op Assessment: Post-op Vital signs reviewed, Patient's Cardiovascular Status Stable, Respiratory Function Stable, Patent Airway, No signs of Nausea or vomiting and Pain level controlled  Post-op Vital Signs: Reviewed and stable  Complications: No apparent anesthesia complications

## 2011-10-10 NOTE — Op Note (Deleted)
OPERATIVE REPORT  DATE OF SURGERY: 10/10/2011  PATIENT:  [name],  51 y.o. 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 Patient has failed conservative therapy and wishes to proceed with foot salvage surgery and presents at this time for partial calcaneal excision application of a cell xenograft application of antibiotic beads and wound closure. Risks and benefits were discussed including infection neurovascular injury potential for transtibial amputation. Patient states he understands and wishes to pursue this time. The patient brought to or room 15 and underwent a general anesthetic.  After adequate levels of anesthesia were obtained patient's left lower extremity was prepped using DuraPrep and draped into a sterile field. A longitudinal midline elliptical incision was made around the ulcer this was carried down to bone. There was some black retained material in the deep tissue and this did have the appearance of possibly some alginate silver dressing retained from previous wicking of the wound open. Patient underwent partial calcaneal excision there was good bleeding granulation tissue and the bone no signs of abscess no signs of any disc vascularity. The wounds irrigated with normal saline a cell tissue graft xenograft and a cell tissue graft powder were applied up against the bone. The wound void was then filled with antibiotic beads with 1 g of vancomycin and 240 mg of gentamicin. The wound is  closed using 2-0 nylon there was no tension on the skin. Wound is covered with Adaptic orthopedic sponges AB dressing Kerlix and Coban. Patient was extubated taken to the PACU in stable condition plan for overnight observation.  PLAN OF CARE: Admit for overnight observation  PATIENT DISPOSITION:  PACU - hemodynamically stable.   Nadara Mustard, MD 10/10/2011 10:44 AM

## 2011-10-10 NOTE — Op Note (Signed)
OPERATIVE REPORT  DATE OF SURGERY: 10/10/2011  PATIENT:  [name],  51 y.o. female  PRE-OPERATIVE DIAGNOSIS:  Medial Meniscus Tear Right Knee  POST-OPERATIVE DIAGNOSIS:  Medial Meniscus Tear Right Knee. Lateral meniscal tear  PROCEDURE:  Procedure(s): ARTHROSCOPY KNEE. Partial medial and lateral meniscectomy.  SURGEON:  Surgeon(s): Nadara Mustard, MD  ANESTHESIA:   general  EBL:  Minimal ML  SPECIMEN:  No Specimen  TOURNIQUET:  * Missing tourniquet times found for documented tourniquets in log:  [id number] *  PROCEDURE DETAILS: Patient is a 51 year old woman with progressive mechanical symptoms of her right knee. She has failed conservative care she has pain with activities of daily living she has catching locking giving way of the right knee. MRI scan confirms a medial meniscal tear she presents at this time for arthroscopic debridement. Risks and benefits were discussed including infection neurovascular injury persistent pain DVT pulmonary and was need for additional surgery. Patient states he understands and wishes to proceed at this time. Description of procedure patient was brought to OR room 15 and underwent a general anesthetic. After adequate levels of anesthesia were obtained patient's right lower extremity was prepped using DuraPrep and draped into a sterile field. The scope was inserted through the inferior lateral portal and an inferior medial working portal was established. Visualization of the medial joint line showed essentially intact cartilage of the medial femoral condyle and medial tibial plateau. Patient had a large degenerative tear of the posterior aspect of the medial meniscus and using the shaver this was debrided back to a stable margin. Examination of the notch an intact anterior cruciate ligament and examination the lateral joint line showed intact cartilage and showed lateral meniscal fraying. The frayed lateral meniscus was debrided with a shaver back to a  stable margin. Examination of patellofemoral joint showed a medial plica and this was debrided as well as well as a synovectomy. The articular cartilage was essentially intact and the patellofemoral joint. A survey of all compartments show there to be no loose bodies. The instruments removed the knee was injected with a total of 20 cc of half percent Marcaine plain the portals were closed with 3-0 nylon. The wound was covered with Adaptic orthopedic sponges AB dressing web roll and Coban. Patient was extubated taken to the PACU in stable condition prescription for Percocet 10 mg tablets discharge to home.  PLAN OF CARE: Discharge to home after PACU  PATIENT DISPOSITION:  PACU - hemodynamically stable.   Nadara Mustard, MD 10/10/2011 9:24 AM

## 2011-10-10 NOTE — H&P (Signed)
[name] is an 51 y.o. female.   Chief Complaint: Right knee pain with mechanical symptoms. HPI: Patient is a 51 year old gentleman who has been having catching locking giving way of his right knee he has failed conservative care MRI scan confirms a medial meniscal tear and he presents at this time for arthroscopic intervention.  Past Medical History  Diagnosis Date  . Adhesive capsulitis of right shoulder     with underlying tendinopathy rotator cuff  . History of post-sterilization tuboplasty 2000  . Tear of meniscus of left knee     x2  . Tear of meniscus of right knee   . Plantar fasciitis     Right  . Shortness of breath     with exertion  . Diabetes mellitus     oral tx  . GERD (gastroesophageal reflux disease)   . Arthritis   . Sleep apnea 5 plus yrs    study -pt could not sleep test inconclusive.     Past Surgical History  Procedure Date  . Cholecystectomy   . Meniscus repair     R and L (L x2)  . Tonsillectomy     Family History  Problem Relation Age of Onset  . Cancer Sister     both sisters have breast cancer   Social History:  reports that she has never smoked. She has never used smokeless tobacco. She reports that she does not drink alcohol or use illicit drugs.  Allergies:  Allergies  Allergen Reactions  . Ofloxacin     REACTION: rash, throat itching, tightness    No current facility-administered medications on file as of 10/10/2011.   Medications Prior to Admission  Medication Sig Dispense Refill  . Liraglutide (VICTOZA) 18 MG/3ML SOLN Take 1.8 mg once daily.  6 mL  6  . pravastatin (PRAVACHOL) 40 MG tablet Take 1 tablet (40 mg total) by mouth daily.  30 tablet  11  . diclofenac (VOLTAREN) 75 MG EC tablet Take 1 tablet (75 mg total) by mouth 2 (two) times daily. With food.  30 tablet  0  . traMADol (ULTRAM) 50 MG tablet Take 50-100 mg by mouth every 6 (six) hours as needed. 1-2 tabs by mouth qhs as needed for severe pain.        No  results found for this or any previous visit (from the past 48 hour(s)). No results found.  Review of Systems  All other systems reviewed and are negative.    There were no vitals taken for this visit. Physical Exam  Right knee is tender to palpation over the medial joint line flexion with internal rotation is painful. MRI scan shows a medial meniscal tear. Assessment/Plan Assessment: Medial meniscal tear right knee.  Plan: Due to failure conservative treatment patient wished to proceed with arthroscopic intervention. Plan for arthroscopic debridement. Risks and benefits were discussed including infection neurovascular injury persistent pain need for additional surgery. Patient states he understands and wished to proceed at this time.  [name] V 10/10/2011, 6:14 AM

## 2011-10-10 NOTE — Anesthesia Preprocedure Evaluation (Addendum)
Anesthesia Evaluation  Patient identified by MRN, date of birth, ID band Patient awake    Reviewed: Allergy & Precautions, H&P , NPO status , Patient's Chart, lab work & pertinent test results  Airway Mallampati: I TM Distance: >3 FB Neck ROM: Full    Dental  (+) Teeth Intact and Dental Advisory Given   Pulmonary shortness of breath, sleep apnea ,  breath sounds clear to auscultation        Cardiovascular Rhythm:Regular Rate:Normal     Neuro/Psych  Neuromuscular disease    GI/Hepatic GERD-  ,  Endo/Other  Diabetes mellitus-, Well Controlled, Oral Hypoglycemic AgentsMorbid obesity  Renal/GU      Musculoskeletal   Abdominal   Peds  Hematology   Anesthesia Other Findings   Reproductive/Obstetrics                         Anesthesia Physical Anesthesia Plan  ASA: III  Anesthesia Plan: General   Post-op Pain Management:    Induction: Intravenous  Airway Management Planned: LMA  Additional Equipment:   Intra-op Plan:   Post-operative Plan: Extubation in OR  Informed Consent: I have reviewed the patients History and Physical, chart, labs and discussed the procedure including the risks, benefits and alternatives for the proposed anesthesia with the patient or authorized representative who has indicated his/her understanding and acceptance.   Dental advisory given  Plan Discussed with: CRNA, Anesthesiologist and Surgeon  Anesthesia Plan Comments:         Anesthesia Quick Evaluation

## 2011-10-10 NOTE — Transfer of Care (Signed)
Immediate Anesthesia Transfer of Care Note  Patient: [name]  Procedure(s) Performed: Procedure(s) (LRB): ARTHROSCOPY KNEE (Right)  Patient Location: PACU  Anesthesia Type: General  Level of Consciousness: awake, alert  and oriented  Airway & Oxygen Therapy: Patient Spontanous Breathing and Patient connected to nasal cannula oxygen  Post-op Assessment: Report given to PACU RN, Post -op Vital signs reviewed and stable and Patient moving all extremities X 4  Post vital signs: Reviewed and stable  Complications: No apparent anesthesia complications

## 2011-10-10 NOTE — Progress Notes (Signed)
Spoke with Dr. Jean Rosenthal regarding CBG of 303. Orders given for Victoza spoke to pharmacy and it is non formulary. Re-contacted Dr. Jean Rosenthal regarding same orders given for Novolog 5 units.

## 2011-10-10 NOTE — Discharge Instructions (Signed)
Progressive ambulation weightbearing as tolerated on the right. Please keep the leg elevated with the the level of the heart use ice on the knee when necessary for pain. Advanced from crutches to weightbearing as tolerated. Discontinue the dressing in 2 days okay to shower and get incision wet at 2 days.

## 2011-10-11 ENCOUNTER — Encounter (HOSPITAL_COMMUNITY): Payer: Self-pay | Admitting: Orthopedic Surgery

## 2011-10-11 LAB — GLUCOSE, CAPILLARY
Glucose-Capillary: 248 mg/dL — ABNORMAL HIGH (ref 70–99)
Glucose-Capillary: 261 mg/dL — ABNORMAL HIGH (ref 70–99)

## 2011-10-11 NOTE — Progress Notes (Signed)
Clinical Social Work Department BRIEF PSYCHOSOCIAL ASSESSMENT 10/11/2011  Patient:  [name], [name]     Account Number:  [id number]     Admit date:  10/10/2011  Clinical Social Worker:  Conley Simmonds  Date/Time:  10/11/2011 12:20 PM  Referred by:  Physician  Date Referred:  10/11/2011 Referred for  Psychosocial assessment   Other Referral:   Interview type:  Patient Other interview type:    PSYCHOSOCIAL DATA Living Status:  FAMILY Admitted from facility:   Level of care:   Primary support name:  Velda Wendt Primary support relationship to patient:  SPOUSE Degree of support available:   Very strong-pt husband and children very supportive and children live close by.    CURRENT CONCERNS Current Concerns  Other - See comment   Other Concerns:   Pt son remains hospitalized and pt is primary caregiver for son and husband. CSW assessed for home needs and psychocial support.    SOCIAL WORK ASSESSMENT / PLAN CSW met with pt and husband at bedside to discuss d/c plans. Pt is primary caregiver for son who is a quad and pt husband who is paraplegic. Pt very informed with regards to her own needs as well as family needs-pt d/c home today with husband and CSW will follow up re: son's d/c planning   Assessment/plan status:  Other - See comment Other assessment/ plan:   CSW will continue to follow pt son who is admitted under [facility]   Information/referral to community resources:   None at this time    PATIENT'S/FAMILY'S RESPONSE TO PLAN OF CARE: PT and husband displayed great insight into needs of all family members both medically and emotionally-Pt greatful for check in and advocated for herself and her son to have adequate time to heal prior to d/c home. Pt son remains hospitalized and CSW is following son. CSW

## 2011-10-11 NOTE — Progress Notes (Signed)
Physical Therapy Patient Details Name: [name] MRN: [id number] DOB: [birth year] Today's Date: 10/11/2011  Per pt and RN, pt up Mod I with RW and no need for acute PT at this time.  Pt will need RW for D/C, otherwise no skilled PT needs at this time.  Will sign off.    Sunny Schlein, [place] [phone] 10/11/2011, 9:23 AM

## 2011-10-11 NOTE — Discharge Summary (Signed)
Physician Discharge Summary  Patient ID: [name] MRN: [id number] DOB/AGE: June 21, 1961 51 y.o.  Admit date: 10/10/2011 Discharge date: 10/11/2011  Admission Diagnoses: Medial and lateral meniscal tear right knee  Discharge Diagnoses: Same Active Problems:  * No active hospital problems. *    Discharged Condition: stable  Hospital Course: Patient's hospiNonetially unremarkable she underwent arthroscopic debridement of the medial and lateral meniscal tears and was kept overnight for observation due to pain and was discharged to home.  Consults: None  Significant Diagnostic Studies: labs: Routine labs  Treatments: surgery: Arthroscopic knee surgery please see operative.  Discharge Exam: Blood pressure 113/75, pulse 67, temperature 98.5 F (36.9 C), temperature source Oral, resp. rate 18, SpO2 97.00%. Incision/Wound: clean and dry  Disposition: 01-Home or Self Care  Discharge Orders    Future Appointments: Provider: Department: Dept Phone: Center:   11/13/2011 10:15 AM Elyse Jarvis, MD [facility] [phone] [facility]     Future Orders Please Complete By Expires   Diet - low sodium heart healthy      Diet - low sodium heart healthy      Call MD / Call 911      Comments:   If you experience chest pain or shortness of breath, CALL 911 and be transported to the hospital emergency room.  If you develope a fever above 101 F, pus (white drainage) or increased drainage or redness at the wound, or calf pain, call your surgeon's office.   Constipation Prevention      Comments:   Drink plenty of fluids.  Prune juice may be helpful.  You may use a stool softener, such as Colace (over the counter) 100 mg twice a day.  Use MiraLax (over the counter) for constipation as needed.   Increase activity slowly as tolerated      Weight Bearing as taught in Physical Therapy      Comments:   Use a walker or crutches as instructed.   Call MD / Call 911      Comments:   If you  experience chest pain or shortness of breath, CALL 911 and be transported to the hospital emergency room.  If you develope a fever above 101 F, pus (white drainage) or increased drainage or redness at the wound, or calf pain, call your surgeon's office.   Constipation Prevention      Comments:   Drink plenty of fluids.  Prune juice may be helpful.  You may use a stool softener, such as Colace (over the counter) 100 mg twice a day.  Use MiraLax (over the counter) for constipation as needed.   Increase activity slowly as tolerated      Weight Bearing as taught in Physical Therapy      Comments:   Use a walker or crutches as instructed.     Medication List  As of 10/11/2011  6:44 AM   TAKE these medications         diclofenac 75 MG EC tablet   Commonly known as: VOLTAREN   Take 1 tablet (75 mg total) by mouth 2 (two) times daily. With food.      Liraglutide 18 MG/3ML Soln   Take 1.8 mg once daily.      oxyCODONE-acetaminophen 10-325 MG per tablet   Commonly known as: PERCOCET   Take 1 tablet by mouth every 6 (six) hours as needed. Q 6-8 hours PRN      oxyCODONE-acetaminophen 10-325 MG per tablet   Commonly known as: PERCOCET  Take 1 tablet by mouth every 4 (four) hours as needed for pain.      pravastatin 40 MG tablet   Commonly known as: PRAVACHOL   Take 1 tablet (40 mg total) by mouth daily.      traMADol 50 MG tablet   Commonly known as: ULTRAM   Take 50-100 mg by mouth every 6 (six) hours as needed. 1-2 tabs by mouth qhs as needed for severe pain.           Follow-up Information    Follow up with Tanaysia Bhardwaj V, MD in 2 weeks.   Contact information:   [address] [place] [address] [phone]          Signed: Nadara Mustard 10/11/2011, 6:44 AM

## 2011-10-16 ENCOUNTER — Ambulatory Visit (HOSPITAL_COMMUNITY)
Admission: RE | Admit: 2011-10-16 | Discharge: 2011-10-16 | Disposition: A | Discharge status: Home / Self Care | Payer: Self-pay | Source: Ambulatory Visit | Attending: Orthopedic Surgery | Admitting: Orthopedic Surgery

## 2011-10-16 DIAGNOSIS — M79609 Pain in unspecified limb: Secondary | ICD-10-CM

## 2011-10-16 DIAGNOSIS — M7989 Other specified soft tissue disorders: Secondary | ICD-10-CM

## 2011-10-16 DIAGNOSIS — M79669 Pain in unspecified lower leg: Secondary | ICD-10-CM

## 2011-10-16 NOTE — Progress Notes (Signed)
VASCULAR LAB PRELIMINARY  PRELIMINARY  PRELIMINARY  PRELIMINARY  Right lower extremity venous duplex completed.    Preliminary report:  Right:  No evidence of DVT, superficial thrombosis, or Baker's cyst.  Terance Hart, RVT 10/16/2011, 6:47 PM

## 2011-11-13 ENCOUNTER — Encounter: Payer: Self-pay | Admitting: Internal Medicine

## 2012-04-07 ENCOUNTER — Ambulatory Visit (HOSPITAL_COMMUNITY)
Admission: RE | Admit: 2012-04-07 | Discharge: 2012-04-07 | Disposition: A | Discharge status: Home / Self Care | Payer: Self-pay | Source: Ambulatory Visit | Attending: Internal Medicine | Admitting: Internal Medicine

## 2012-04-07 ENCOUNTER — Ambulatory Visit (INDEPENDENT_AMBULATORY_CARE_PROVIDER_SITE_OTHER): Payer: Self-pay | Admitting: Internal Medicine

## 2012-04-07 ENCOUNTER — Encounter: Payer: Self-pay | Admitting: Internal Medicine

## 2012-04-07 VITALS — BP 128/77 | HR 69 | Temp 98.6°F | Wt 212.9 lb

## 2012-04-07 DIAGNOSIS — M545 Low back pain, unspecified: Secondary | ICD-10-CM | POA: Insufficient documentation

## 2012-04-07 DIAGNOSIS — E119 Type 2 diabetes mellitus without complications: Secondary | ICD-10-CM

## 2012-04-07 DIAGNOSIS — Z79899 Other long term (current) drug therapy: Secondary | ICD-10-CM

## 2012-04-07 DIAGNOSIS — M549 Dorsalgia, unspecified: Secondary | ICD-10-CM

## 2012-04-07 DIAGNOSIS — M722 Plantar fascial fibromatosis: Secondary | ICD-10-CM

## 2012-04-07 LAB — GLUCOSE, CAPILLARY: Glucose-Capillary: 357 mg/dL — ABNORMAL HIGH (ref 70–99)

## 2012-04-07 LAB — POCT GLYCOSYLATED HEMOGLOBIN (HGB A1C): Hemoglobin A1C: 10.3

## 2012-04-07 MED ORDER — HYDROCODONE-ACETAMINOPHEN 5-300 MG PO TABS: ORAL_TABLET | ORAL | Status: DC

## 2012-04-07 MED ORDER — INSULIN GLARGINE 100 UNIT/ML ~~LOC~~ SOLN: SUBCUTANEOUS | Status: DC

## 2012-04-07 MED ORDER — MELOXICAM 15 MG PO TABS: 15.0000 mg | ORAL_TABLET | Freq: Every day | ORAL | Status: DC
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
## 2012-04-07 NOTE — Assessment & Plan Note (Signed)
Patient continues to have pain that is interfering with her daily activity of living. She has been taking over the counter meds such as aleve, tylenol without relief. She also took Tramadol but not effective. She does not want to be on strong narcotics. She was also told not to get anymore steroid injections for her feet. -Will try Meloxicam 15mg  poqd -Will give a short course of Vicodin 5mg  po qhs PRN #31 with NO RF. Patient understands that this is short term and should she needs it long term, she will need to come back to see her primary physician for possible pain contract.  I recommend that patient see podiatrist for better shoe fitting such as orthotics which may help with cushion.  Surgery is another option but she states that Dr. Lajoyce Corners told her that it will take about 6 months to recover and she will not be able to take care of her son and husband.  -Will refer to podiatry

## 2012-04-07 NOTE — Assessment & Plan Note (Signed)
HbA1c not well controlled, it has trended up from 8.6 to 10.3 today due to diet noncompliance and weight gain.  Feet exam unremarkable today.   -Will start Lantus 10 units qhs -Continue VIctoza -May try Metformin 250mg  bid to see if she can tolerate -Check blood sugar 3 times daily -Bring meter to next visit -Will need eye exam schedule

## 2012-04-07 NOTE — Patient Instructions (Addendum)
Start taking Meloxicam 15mg  one tablet daily You can take Vicodin 5mg  one tablet at bedtime as needed for pain Will refer to podiatry for your feet pain You need to follow up with Dr. Lajoyce Corners for your knee pain Start Lantus 10 units at bedtime Follow up with Dr. Dorthula Rue in 1 month

## 2012-04-07 NOTE — Assessment & Plan Note (Signed)
Musculoskeletal is likely the diagnosis.  She does not have any radiculopathy which makes impingement less likely.   -Will send for Back Xray -NSAIDs and Vicodin for pain PRN, again, vicodin is supposed to be short course and no refill given -May need physical therapy if no improvement

## 2012-05-09 ENCOUNTER — Encounter: Payer: Self-pay | Admitting: Family Medicine

## 2012-05-09 ENCOUNTER — Ambulatory Visit (INDEPENDENT_AMBULATORY_CARE_PROVIDER_SITE_OTHER): Payer: Self-pay | Admitting: Family Medicine

## 2012-05-09 DIAGNOSIS — M722 Plantar fascial fibromatosis: Secondary | ICD-10-CM

## 2012-05-09 MED ORDER — HYDROCODONE-ACETAMINOPHEN 5-325 MG PO TABS: 1.0000 | ORAL_TABLET | Freq: Every evening | ORAL | Status: DC | PRN

## 2012-05-09 MED ORDER — AMITRIPTYLINE HCL 50 MG PO TABS: 50.0000 mg | ORAL_TABLET | Freq: Every day | ORAL | Status: DC

## 2012-05-09 MED ORDER — NITROGLYCERIN 0.1 MG/HR TD PT24: MEDICATED_PATCH | TRANSDERMAL | Status: DC

## 2012-05-09 NOTE — Assessment & Plan Note (Signed)
Patient has seen orthopedic physician previously for potential surgical excision of the plantar fascia fibromatosis. Patient has not had this accomplished because it has not been constant. On ultrasound today measurements were taken and pictures reprinted to she can take this to her orthopedic surgeon. Patient is also going to try a nitroglycerin patch on the area to see if this helps with healing. In addition to this patient will start amitriptyline at night. Patient Re: has prescription for Vicodin from her orthopedic surgeon. Patient will come back in 2 weeks' time for orthotics to try to help with cushioning to try to decrease the amount of compression.

## 2012-05-09 NOTE — Patient Instructions (Addendum)
Very nice to meet you. I'm giving you a nitroglycerin patch. Please put to half a patch on the area of pain on your feet daily. Remember the most common side effect of this his headaches. Try it on only one foot I am also given you a medicine called amitriptyline.  Take one pill at night  I am also going to send you to physical therapy where they can put some cream and do therapeutic ultrasound which may be beneficial. You should discuss this with Dr. Lajoyce Corners as well and show him the pictures Come back in 2 weeks for orthotics.

## 2012-05-09 NOTE — Progress Notes (Signed)
Subjective: Patient is a very pleasant 51 year old female coming in with bilateral foot pain. Patient has had plantar fibromatosis for quite some time. Patient has tried many different modalities so far. Patient has tried injections, sports insoles, arch supports with no relief. Patient states that the pain seems to be increasing. Patient denies any numbness but finds it even difficult to do her regular activities of daily living now secondary to the pain. Patient is the primary caregiver for her son who is a quadriplegic and is making it very difficult for her to do the chores that are necessary for him as well. Patient states that the weight as she walks but a lot of stress on her knees as well which she is seeing Dr. Lajoyce Corners for. Patient did have what appears to be a meniscectomy done on the right knee back in March of this year. Patient states that the needle secondary to how she walks seems to continue to give her pain.  14 system review is done and unremarkable as related to the orthopedic problems  Past medical surgical family history reviewed without any changes  Physical exam General: No apparent distress obese female FEET: Bilaterally she has fairly normal longitudinal arch, some loss of transverse arch. She has normal intact sensation to soft touch. She is normal capillary refill. The plantar fascia on the left foot has 2 very distinct nodules in the midportion of the plantar fascia each nodule is about 1 cm which is larger than felt in 02/2011. These are quite tender. The right that has similar single nodule that is 1 cm in diameter and is tender as well. Patient has minimal pain tender at the insertion of plantar fasciitis on the medial aspect of the calcaneus.  Ultrasound was done and interpreted by me today. Patient does have what appears to be actually one large fibroma it is very superficial on the left plantar aspect of the foot just distal to the mid portion of the foot. This measures 3.47  cm in length. This is very tender during exam and has so much scar tissue around it is noncompressible. Patient also has what appears to be another fibroma on the right side measuring about 1.5 cm in length that is in about the same position as her contralateral side. Patient's plantar fascia at insertion measures 0.46 cm which is just minorly thickened.

## 2012-05-12 ENCOUNTER — Encounter: Payer: Self-pay | Admitting: Family Medicine

## 2012-05-21 ENCOUNTER — Ambulatory Visit: Payer: Self-pay | Admitting: Internal Medicine

## 2012-05-23 ENCOUNTER — Encounter: Payer: Self-pay | Admitting: Internal Medicine

## 2012-05-23 ENCOUNTER — Ambulatory Visit (INDEPENDENT_AMBULATORY_CARE_PROVIDER_SITE_OTHER): Payer: Self-pay | Admitting: Family Medicine

## 2012-05-23 ENCOUNTER — Ambulatory Visit (INDEPENDENT_AMBULATORY_CARE_PROVIDER_SITE_OTHER): Payer: Self-pay | Admitting: Internal Medicine

## 2012-05-23 ENCOUNTER — Encounter: Payer: Self-pay | Admitting: Family Medicine

## 2012-05-23 VITALS — BP 116/79 | HR 84 | Temp 97.4°F | Ht 62.0 in | Wt 213.0 lb

## 2012-05-23 DIAGNOSIS — M797 Fibromyalgia: Secondary | ICD-10-CM | POA: Insufficient documentation

## 2012-05-23 DIAGNOSIS — M722 Plantar fascial fibromatosis: Secondary | ICD-10-CM

## 2012-05-23 DIAGNOSIS — IMO0001 Reserved for inherently not codable concepts without codable children: Secondary | ICD-10-CM

## 2012-05-23 DIAGNOSIS — E119 Type 2 diabetes mellitus without complications: Secondary | ICD-10-CM

## 2012-05-23 DIAGNOSIS — E785 Hyperlipidemia, unspecified: Secondary | ICD-10-CM

## 2012-05-23 DIAGNOSIS — Z23 Encounter for immunization: Secondary | ICD-10-CM

## 2012-05-23 DIAGNOSIS — Z0000 Encounter for general adult medical examination without abnormal findings: Secondary | ICD-10-CM

## 2012-05-23 LAB — CBC
HCT: 43.3 % (ref 36.0–46.0)
Hemoglobin: 14.8 g/dL (ref 12.0–15.0)
MCH: 29.1 pg (ref 26.0–34.0)
MCHC: 34.2 g/dL (ref 30.0–36.0)
MCV: 85.2 fL (ref 78.0–100.0)
Platelets: 331 10*3/uL (ref 150–400)
RBC: 5.08 MIL/uL (ref 3.87–5.11)
RDW: 12.8 % (ref 11.5–15.5)
WBC: 10 10*3/uL (ref 4.0–10.5)

## 2012-05-23 MED ORDER — DULOXETINE HCL 30 MG PO CPEP: 30.0000 mg | ORAL_CAPSULE | Freq: Every day | ORAL | Status: DC

## 2012-05-23 NOTE — Progress Notes (Signed)
Patient ID: [name], female   DOB: Apr 04, 1961, 51 y.o.   MRN: [id number]   F/u  Plantar fasciitis Dr Lajoyce Corners (who sees her for her knees) did not comment on the pictures ([name]) we had sent him. He injected her knees.  We had discussed orthotics.Patient was fitted for a : standard, cushioned, semi-rigid orthotic. The orthotic was heated, placed on the orthotic stand. The patient was positioned in subtalar neutral position and 10 degrees of ankle dorsiflexion in a weight bearing stance on the heated orthotic blank After completion of molding, a stable base was applied to the orthotic blank. The blank was ground to a stable position for weight bearing. Blank: red Base:blue Posting:brick colored scaphoid pads  Face to face time spent in evaluation, measurement and manufacture of custom molded orthotic was 40 minutes.

## 2012-05-23 NOTE — Progress Notes (Signed)
  Subjective:    Patient ID: [name], female    DOB: June 11, 1961, 51 y.o.   MRN: [id number]  HPI: 51 year old woman with past medical history significant for type 2 diabetes mellitus, hyperlipidemia, plantar fasciitis comes to the clinic for a followup visit.  Patient takes care of her son and her husband at home. She reports that she hurts all over her body. She states that her mother and sister have been recently diagnosed with fibromyalgia.  DM:she states that her blood sugars are running below 150 usually since she has started Lantus. Denies any hypoglycemic events. Did not bring her meter.  Plantar fascitis: She followed up with Dr. Denny Levy prescribed her orthotics before the clinic appointment. It is too early for patient to see if that helps.      Review of Systems  Constitutional: Negative for chills and fatigue.  HENT: Negative for congestion, rhinorrhea and postnasal drip.   Respiratory: Negative for shortness of breath.   Cardiovascular: Negative for chest pain and leg swelling.  Gastrointestinal: Negative for abdominal pain and constipation.  Musculoskeletal: Negative for arthralgias.  Neurological: Negative for facial asymmetry.  Hematological: Negative for adenopathy.       Objective:   Physical Exam  Constitutional: She is oriented to person, place, and time. She appears well-developed and well-nourished.  HENT:  Head: Normocephalic.  Eyes: Conjunctivae normal and EOM are normal. Pupils are equal, round, and reactive to light.  Neck: Normal range of motion. No JVD present. No tracheal deviation present. No thyromegaly present.  Pulmonary/Chest: Effort normal and breath sounds normal. No stridor. She has no wheezes.  Abdominal: Soft. Bowel sounds are normal. She exhibits no distension. There is no tenderness.  Musculoskeletal: Normal range of motion. She exhibits no edema and no tenderness.  Neurological: She is alert and oriented to person, place, and  time. She has normal reflexes. No cranial nerve deficit. Coordination normal.  Skin: Skin is warm.          Assessment & Plan:

## 2012-05-23 NOTE — Assessment & Plan Note (Signed)
Check lipid panel and CMET. 

## 2012-05-23 NOTE — Assessment & Plan Note (Signed)
Lab Results  Component Value Date   HGBA1C 10.3 04/07/2012   HGBA1C 9.0 07/26/2010   CREATININE 0.51 09/26/2011   CREATININE 0.72 08/30/2011   MICROALBUR 0.50 02/15/2011   MICRALBCREAT 3.4 02/15/2011   CHOL 181 08/30/2011   HDL 41 08/30/2011   TRIG 136 08/30/2011    Last eye exam and foot exam: No results found for this basename: HMDIABEYEEXA, HMDIABFOOTEX    Assessment: She did not get her meter today but claims that her CBGs are running below 150 since she has been started on Lantus in addition to her victoza. Diabetes control: unable to asses Progress toward goals: deteriorated Barriers to meeting goals: lack of understanding of disease management  Plan: Diabetes treatment: continue current medications. Check mic/alb ratio. Opthalmology referral. Refer to: diabetes educator for self-management training, diabetes educator for medical nutrition therapy and nutritionist Instruction/counseling given: reminded to get eye exam, reminded to bring blood glucose meter & log to each visit, reminded to bring medications to each visit, discussed foot care and discussed the need for weight loss

## 2012-05-23 NOTE — Assessment & Plan Note (Addendum)
Patient was seen by Dr. Denny Levy before this clinic appointment and she was given orthotics. The decision for surgery has to be determined by orthopedics. - NTG patch prescribed by sports medicine clinic. -She would start physical therapy for her plantar fasciitis next week. -Would give her Vicodin for breakthrough pain with physical therapy.

## 2012-05-23 NOTE — Assessment & Plan Note (Signed)
She reports having aches and pains everywhere. On exam she had 14 point tenderness which is likely consistent with diagnoses of fibromyalgia. Also I no that this patient has to do a lot of physical work and taking care of her paraplegic husband and quadriplegic son. She also has plantar fasciitis and was given orthotics for this clinic appointment .she takes Vicodin occasionally at night. Given her increased stress and physical activity with plantar fasciitis I would make her sign a pain contract for 30 Vicodin tablets a month. -Would also start her on Cymbalta for fibromyalgia

## 2012-05-23 NOTE — Patient Instructions (Addendum)
Please schedule a follow up appointment in 1 month . Please bring your medication bottles with your next appointment. Please take your medicines as prescribed. I will call you with your lab results if anything will be abnormal. 

## 2012-05-23 NOTE — Assessment & Plan Note (Signed)
She got a flu shot today. Ambulatory referral to ophthalmology for annual eye exam was done.

## 2012-05-24 LAB — COMPLETE METABOLIC PANEL WITH GFR
ALT: 20 U/L (ref 0–35)
AST: 19 U/L (ref 0–37)
Albumin: 3.9 g/dL (ref 3.5–5.2)
Alkaline Phosphatase: 100 U/L (ref 39–117)
BUN: 11 mg/dL (ref 6–23)
CO2: 24 mEq/L (ref 19–32)
Calcium: 9.5 mg/dL (ref 8.4–10.5)
Chloride: 103 mEq/L (ref 96–112)
Creat: 0.57 mg/dL (ref 0.50–1.10)
GFR, Est African American: >89 mL/min
GFR, Est Non African American: >89 mL/min
Glucose, Bld: 151 mg/dL — ABNORMAL HIGH (ref 70–99)
Potassium: 4.1 mEq/L (ref 3.5–5.3)
Sodium: 136 mEq/L (ref 135–145)
Total Bilirubin: 0.4 mg/dL (ref 0.3–1.2)
Total Protein: 7.1 g/dL (ref 6.0–8.3)

## 2012-05-24 LAB — LIPID PANEL
Cholesterol: 207 mg/dL — ABNORMAL HIGH (ref 0–200)
HDL: 47 mg/dL (ref >39)
LDL Cholesterol: 133 mg/dL — ABNORMAL HIGH (ref 0–99)
Total CHOL/HDL Ratio: 4.4 Ratio
Triglycerides: 137 mg/dL (ref <150)
VLDL: 27 mg/dL (ref 0–40)

## 2012-05-24 LAB — MICROALBUMIN / CREATININE URINE RATIO
Creatinine, Urine: 179.6 mg/dL
Microalb Creat Ratio: 3.5 mg/g (ref 0.0–30.0)
Microalb, Ur: 0.63 mg/dL (ref 0.00–1.89)

## 2012-05-27 ENCOUNTER — Telehealth: Payer: Self-pay | Admitting: *Deleted

## 2012-05-27 NOTE — Telephone Encounter (Signed)
Pt is getting tramadol from dr duda

## 2012-05-28 ENCOUNTER — Ambulatory Visit: Payer: Self-pay | Admitting: Physical Therapy

## 2012-05-28 ENCOUNTER — Other Ambulatory Visit: Payer: Self-pay | Admitting: *Deleted

## 2012-05-28 DIAGNOSIS — E119 Type 2 diabetes mellitus without complications: Secondary | ICD-10-CM

## 2012-05-29 MED ORDER — LIRAGLUTIDE 18 MG/3ML ~~LOC~~ SOLN: SUBCUTANEOUS | Status: DC

## 2012-05-29 NOTE — Telephone Encounter (Signed)
Victoza rx refilled -request faxed to [facility].

## 2012-06-02 ENCOUNTER — Ambulatory Visit (INDEPENDENT_AMBULATORY_CARE_PROVIDER_SITE_OTHER): Payer: Self-pay | Admitting: Internal Medicine

## 2012-06-02 ENCOUNTER — Telehealth: Payer: Self-pay | Admitting: Internal Medicine

## 2012-06-02 ENCOUNTER — Encounter: Payer: Self-pay | Admitting: Internal Medicine

## 2012-06-02 VITALS — BP 114/79 | HR 105 | Temp 98.3°F | Ht 62.0 in | Wt 215.1 lb

## 2012-06-02 DIAGNOSIS — R51 Headache: Secondary | ICD-10-CM

## 2012-06-02 DIAGNOSIS — R519 Headache, unspecified: Secondary | ICD-10-CM | POA: Insufficient documentation

## 2012-06-02 DIAGNOSIS — R42 Dizziness and giddiness: Secondary | ICD-10-CM | POA: Insufficient documentation

## 2012-06-02 DIAGNOSIS — M722 Plantar fascial fibromatosis: Secondary | ICD-10-CM

## 2012-06-02 LAB — GLUCOSE, CAPILLARY: Glucose-Capillary: 264 mg/dL — ABNORMAL HIGH (ref 70–99)

## 2012-06-02 MED ORDER — TRAMADOL HCL 50 MG PO TABS: 50.0000 mg | ORAL_TABLET | Freq: Four times a day (QID) | ORAL | Status: DC | PRN

## 2012-06-02 NOTE — Telephone Encounter (Signed)
Agree with plan 

## 2012-06-02 NOTE — Assessment & Plan Note (Signed)
Patient with dizziness after using nitroglycerin patch for plantar fascial fibroma. She took off the Nitropatch today in clinic. No other associated symptoms concerning for other pathology. Although cannot rule out cardiac or neurologic dizziness. 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 Remove the patch today in follow symptomatically. - If patient does not get better we'll see her back.

## 2012-06-02 NOTE — Assessment & Plan Note (Signed)
Worsening pain. Now sharp and shooting going up to the leg from left plantar aspect. Patient feels that the nitroglycerin patch did not help shrink fibroma and actually it is bigger. Will give her tramadol 50 mg 60 pills. She is not due for Vicodin refill on the 25th. - She called at sports medicine clinic today and is waiting for their call back for appointment.

## 2012-06-02 NOTE — Progress Notes (Signed)
  Subjective:    Patient ID: [name], female    DOB: January 25, 1961, 50 y.o.   MRN: [id number]  HPI Pt is a pleasant 51 yo woman with Dm2, Plantar faciitis, R knee pain - followed by Sports Medicine and Orthopedics who comes to the clinic for acute visit for headache and dizziness for last 5 days after starting Nitro patch for plantar facial fibroma. She reports tolerating Nitro patch on her foot for about a week without any issues, but for past 5 days she started feeling dizzy and having headaches. Dizziness is associated with in body positions and does not have a ringing sensation in ear or spinning sensation. She does feel like she would pass out though. She denies any change in vision. She has a global headache, constant. No N/V, neck pain. No fever, chills, palpitatons, abdominal pain, diarrhea.  She also reports worsening of her left foot pain. The pain starts at the fibroma site and goes back to the she will and up to her leg. She never had this kind of sharp shooting pain before. She does not report any weakness in her foot.  She couldn't get an appt with Sports medicine today. She feels that her fibroma has gotten bigger than before after using nitro patch.   Review of Systems    As per HPI. Objective:   Physical Exam  General: Mild distress due to pain. HEENT: PERRL, EOMI, no scleral icterus Cardiac: S1, S2, RRR, no rubs, murmurs or gallops Pulm: clear to auscultation bilaterally, moving normal volumes of air Abd: soft, nontender, nondistended, BS present Ext: warm and well perfused, no pedal edema. Palpable tender fibroma on left mid plantar aspect.  Neuro: alert and oriented X3, cranial nerves II-XII grossly intact       Assessment & Plan:

## 2012-06-02 NOTE — Patient Instructions (Addendum)
Please make a follow appointment as needed. Followup with sports medicine clinic for fibroma and pain. Take tramadol 50 mg one tablet every 6 hours as needed for pain. Call back if the pain does not get better. The dizziness and headache should go away by tomorrow. If they don't give [name] a call back for appointment.

## 2012-06-02 NOTE — Telephone Encounter (Signed)
Patient has been having bad stabbing pains in her left foot over the weekend.  It is nothing like she has ever felt before.  One lasted for over an hour.  She is wondering if it could be caused by the Nitro patches that she has been putting on her foot.  She had to take Vicodin for the pain.  She is not sure what to do about this.

## 2012-06-02 NOTE — Telephone Encounter (Signed)
Pt presents to front desk asking to be seen this pm for "stabbing" pain to her left foot, sports medicine was unable to see her. Per [place]. Pt is placed on dr patel's schedule for 1330.

## 2012-06-02 NOTE — Assessment & Plan Note (Signed)
Headache along with dizziness most likely side effect of her Nitropatch. - Remove the patch and will follow clinically.

## 2012-06-06 ENCOUNTER — Encounter: Payer: Self-pay | Admitting: Family Medicine

## 2012-06-06 ENCOUNTER — Ambulatory Visit (INDEPENDENT_AMBULATORY_CARE_PROVIDER_SITE_OTHER): Payer: Self-pay | Admitting: Family Medicine

## 2012-06-06 VITALS — BP 113/77 | HR 76 | Ht 62.0 in | Wt 215.0 lb

## 2012-06-06 DIAGNOSIS — M722 Plantar fascial fibromatosis: Secondary | ICD-10-CM

## 2012-06-06 MED ORDER — PAROXETINE HCL 20 MG PO TABS: 20.0000 mg | ORAL_TABLET | Freq: Every day | ORAL | Status: DC

## 2012-06-06 MED ORDER — TRAMADOL HCL 50 MG PO TABS: 100.0000 mg | ORAL_TABLET | Freq: Three times a day (TID) | ORAL | Status: DC | PRN

## 2012-06-06 NOTE — Patient Instructions (Signed)
We adjusted your orthotic so I hope this helps.  We will increase your tramadol to 100mg  three times a day We will start paxil at night to help.  We will see you again in 1 month.

## 2012-06-06 NOTE — Assessment & Plan Note (Signed)
This is a very difficult case. Patient was placed in custom orthotics which has helped her pain overall but then started having this nagging sharp pain on the left side. Did make adjustments to her orthotics today as stated above and did have some improvement. Hopefully this will continue to help. We did add a low-dose Paxil which could help with the pain as well. Patient was given Cymbalta but has unfortunately not been able to fill this yet from her primary care physician. Started at a low enough dose the patient can take them together. Patient will try this medication and in return in 3-6 weeks for further evaluation. We may consider topical anti-inflammatories.

## 2012-06-06 NOTE — Progress Notes (Signed)
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 Patient was given custom orthotics which has helped her knee pain she says tremendously but she started to have more pain in her left foot. Patient states that this is more of a stabbing sensation in the plantar aspect right in the middle of her foot. Patient did see Dr. Lajoyce Corners recently who said that all he had left with potential surgical intervention. Patient declined at this time. Patient was also told that she should not have any more corticosteroid injections into her plantar fascia. Patient denies any fevers or chills. Denies any redness of the foot. Patient was taking the nitroglycerin patch but unable to wear now because of getting her migraines.  Past medical history, social, surgical and family history all reviewed.   Review of systems: 14 system review is unremarkable as related to the orthopedic problem.  Physical exam Blood pressure 113/77, pulse 76, height 5\' 2"  (1.575 m), weight 215 lb (97.523 kg), last menstrual period 05/16/2012. General: No apparent distress alert and oriented x3 mood and affect somewhat normal but blunted. Respiratory: Patient's recent full sentences and does not appear short of breath Skin: Warm dry intact with no signs of infection or rash Neuro: Cranial nerves II through XII are intact, neurovascularly intact in all extremities with 2+ DTRs and 2+ pulses. Foot exam:Bilaterally she has fairly normal longitudinal arch, but  loss of transverse arch. She has normal intact sensation to soft touch. She is normal capillary refill. The plantar fascia on the left foot has 2 very distinct nodules in the midportion of the plantar fascia each nodule is about 1 cm. These are quite tender. The right that has similar single nodule that is 1 cm in diameter and is tender as well. Patient has minimal pain tender at the insertion of plantar fasciitis on the medial aspect of the  calcaneus. Gait analysis: Patient is still walking in her shoes with her custom insoles in. Patient does have significant plantar flexion on the right side and left-sided seems to be doing well in a very neutral position. Patient was fitted with an extra medial heel wedge which did help neutral E. Reginia Forts on the right side. Patient states that this did also seem to help her gait with a left foot pain.

## 2012-06-10 ENCOUNTER — Other Ambulatory Visit: Payer: Self-pay | Admitting: *Deleted

## 2012-06-10 MED ORDER — HYDROCODONE-ACETAMINOPHEN 5-325 MG PO TABS: 1.0000 | ORAL_TABLET | Freq: Every evening | ORAL | Status: DC | PRN

## 2012-06-10 NOTE — Telephone Encounter (Signed)
Rx called in 

## 2012-06-10 NOTE — Telephone Encounter (Signed)
Call pt when ready @ [phone]

## 2012-06-20 ENCOUNTER — Other Ambulatory Visit: Payer: Self-pay | Admitting: Family Medicine

## 2012-06-23 ENCOUNTER — Ambulatory Visit (INDEPENDENT_AMBULATORY_CARE_PROVIDER_SITE_OTHER): Payer: No Typology Code available for payment source | Admitting: Internal Medicine

## 2012-06-23 ENCOUNTER — Encounter: Payer: Self-pay | Admitting: Internal Medicine

## 2012-06-23 VITALS — BP 110/73 | HR 63 | Temp 97.3°F | Ht 62.0 in | Wt 218.2 lb

## 2012-06-23 DIAGNOSIS — E785 Hyperlipidemia, unspecified: Secondary | ICD-10-CM

## 2012-06-23 DIAGNOSIS — Z79899 Other long term (current) drug therapy: Secondary | ICD-10-CM

## 2012-06-23 DIAGNOSIS — Z0000 Encounter for general adult medical examination without abnormal findings: Secondary | ICD-10-CM

## 2012-06-23 DIAGNOSIS — IMO0001 Reserved for inherently not codable concepts without codable children: Secondary | ICD-10-CM

## 2012-06-23 DIAGNOSIS — M797 Fibromyalgia: Secondary | ICD-10-CM

## 2012-06-23 DIAGNOSIS — E119 Type 2 diabetes mellitus without complications: Secondary | ICD-10-CM

## 2012-06-23 LAB — GLUCOSE, CAPILLARY: Glucose-Capillary: 181 mg/dL — ABNORMAL HIGH (ref 70–99)

## 2012-06-23 LAB — POCT GLYCOSYLATED HEMOGLOBIN (HGB A1C): Hemoglobin A1C: 8.7

## 2012-06-23 MED ORDER — INSULIN GLARGINE 100 UNIT/ML ~~LOC~~ SOLN: SUBCUTANEOUS | Status: DC

## 2012-06-23 NOTE — Patient Instructions (Addendum)
General Instructions: Please schedule a follow up appointment in 2 months or sooner if needed . Please take your medicines as prescribed. Please schedule an appointment with diabetic educator.    Treatment Goals:  Goals (1 Years of Data) as of 06/23/2012          As of Today 05/23/12 04/07/12 08/30/11 06/11/11     Result Component    . HEMOGLOBIN A1C < 6.5  8.7  10.3 8.6 8.6    . LDL CALC < 100   133  113       Progress Toward Treatment Goals:  Treatment Goal 06/23/2012  Hemoglobin A1C improved    Self Care Goals & Plans:  Self Care Goal 06/23/2012  Manage my medications take my medicines as prescribed; bring my medications to every visit; refill my medications on time  Monitor my health bring my glucose meter and log to each visit; keep track of my weight  Eat healthy foods eat foods that are low in salt  Be physically active take a walk every day    Home Blood Glucose Monitoring 06/23/2012  Check my blood sugar 3 times a day  When to check my blood sugar before breakfast; before lunch; before dinner     Care Management & Community Referrals:  Referral 06/23/2012  Referrals made for care management support diabetes educator

## 2012-06-24 ENCOUNTER — Ambulatory Visit (INDEPENDENT_AMBULATORY_CARE_PROVIDER_SITE_OTHER): Payer: No Typology Code available for payment source | Admitting: Family Medicine

## 2012-06-24 VITALS — BP 130/81 | Ht 62.0 in | Wt 218.0 lb

## 2012-06-24 DIAGNOSIS — M722 Plantar fascial fibromatosis: Secondary | ICD-10-CM

## 2012-06-24 NOTE — Progress Notes (Signed)
Patient returns today for followup of her bilateral fibromas. Patient states that unfortunately over the course of time this seems to be worsening pain. Patient states that even during her regular activities of daily living she is having more pain. Patient states that the custom orthotics that were made for her are very comfortable and has improved her knee pain but unfortunately has not helped her foot pain. Patient was using the nitroglycerin patch previously and unfortunately secondary to headaches had to discontinue it. In addition this patient was prescribed Cymbalta as well as Paxil but she has been unable to have it filled through the medical assistant program at this time and has not been taking anything. Patient is getting hydrocodone from her primary care physician who recently diagnosed fibromyalgia. Patient at this time would warrant surgical intervention. Patient will be following up with Dr. Lajoyce Corners if we agree.  14 system review was done and is unremarkable to the orthopedic problem.  Physical exam Blood pressure 130/81, height 5\' 2"  (1.575 m), weight 218 lb (98.884 kg). General: No apparent distress alert and oriented x3 mood and affect somewhat normal but blunted. Respiratory: Patient's recent full sentences and does not appear short of breath Skin: Warm dry intact with no signs of infection or rash Neuro: Cranial nerves II through XII are intact, neurovascularly intact in all extremities with 2+ DTRs and 2+ pulses. FEET: Bilaterally she has fairly normal longitudinal arch, some loss of transverse arch. She has normal intact sensation to soft touch. She is normal capillary refill. The plantar fascia on the left foot has 2 very distinct nodules in the midportion of the plantar fascia each nodule is about 1- 1.5 cm which is larger than previous exam. These are quite tender. The right that has similar single nodule that is 1 cm in diameter and is tender as well. Patient has minimal pain tender  at the insertion of plantar fasciitis on the medial aspect of the calcaneus.  Ultrasound was done and interpreted by me today. Patient does have what appears to be one large fibroma of superficial on the left plantar aspect of the foot just distal to the mid portion of the foot. This measures 3.53 cm in length. This is very tender during exam and has so much scar tissue around it is noncompressible.  Patient still has what appears to be another fibroma on the right side measuring about 1.5 cm in length that is in about the same position as her contralateral side. Patient's plantar fascia at insertion measures 0.46 cm which is just minorly thickened.

## 2012-06-24 NOTE — Progress Notes (Signed)
Subjective:   Patient ID: [name] female   DOB: [birth year] 51 y.o.   MRN: [id number]  HPI: 51 year old woman with past medical history significant for type 2 diabetes mellitus, plantar fibromatosis, hyperlipidemia presents to the clinic for a followup visit.  DM: She still has not bought any strips and therefore did not get her meter and readings. She states that she occasionally uses her husband's glucose strips and her blood sugars have been running around 180 to 200s. She also reported two hypoglycemic events in the last month when her symptoms included sweating and her CBG was found to be 70. For both of these events she noticed she had an early dinner around 5 and did not eat anything before going to bed when she took her insulin shot. She notes compliance with her insulin and victoza.   She continues to report pain associated with her plantar fibromatosis. She had to be taken off from nitroglycerin patch because of her dizziness and headache. She was also reporting to have knee pain,  hip pain,  back pain and shoulder pain today. She has not been able to refill her prescription for Cymbalta through MAP program yet does the have to order the drug and it might take a few more weeks.  She also reports that she has noticed increased nausea for last 1 month with decreased appetite. She herself was co- relating it to possible increased intake of pain medications for last 1 month   Past Medical History  Diagnosis Date  . Adhesive capsulitis of right shoulder     with underlying tendinopathy rotator cuff  . History of post-sterilization tuboplasty 2000  . Tear of meniscus of left knee     x2  . Tear of meniscus of right knee   . Plantar fasciitis     Right  . Shortness of breath     with exertion  . Diabetes mellitus     oral tx  . GERD (gastroesophageal reflux disease)   . Arthritis   . Sleep apnea 5 plus yrs    study -pt could not sleep test inconclusive.    Family History   Problem Relation Age of Onset  . Cancer Sister     both sisters have breast cancer   History   Social History  . Marital Status: Married    Spouse Name: N/A    Number of Children: N/A  . Years of Education: N/A   Occupational History  . Not on file.   Social History Main Topics  . Smoking status: Never Smoker   . Smokeless tobacco: Never Used  . Alcohol Use: No  . Drug Use: No  . Sexually Active: Not on file   Other Topics Concern  . Not on file   Social History Narrative   Married, unemployed, Husband disabled and paraplegic 2/2 fall from tree stand while deer hunting, Son quadriplegic 2/2 MVA 05/2007 in [place]   Review of Systems: General: Denies fever, chills, diaphoresis, +appetite change and fatigue. HEENT: Denies photophobia, eye pain, redness, hearing loss, ear pain, congestion, sore throat, rhinorrhea, sneezing, mouth sores, trouble swallowing, neck pain, neck stiffness and tinnitus. Respiratory: Denies SOB, DOE, cough, chest tightness, and wheezing. Cardiovascular: Denies to chest pain, palpitations and leg swelling. Gastrointestinal: Denies nausea, vomiting, abdominal pain, diarrhea, constipation, blood in stool and abdominal distention. Genitourinary: Denies dysuria, urgency, frequency, hematuria, flank pain and difficulty urinating. Musculoskeletal: +myalgias, + back pain, joint swelling, arthralgias and gait problem.  Skin: Denies pallor, rash and  wound. Neurological: Denies dizziness, seizures, syncope, weakness, light-headedness, numbness and headaches. Hematological: Denies adenopathy, easy bruising, personal or family bleeding history. Psychiatric/Behavioral: Denies suicidal ideation, mood changes, confusion, nervousness, sleep disturbance and agitation.    Current Outpatient Medications: Current Outpatient Prescriptions  Medication Sig Dispense Refill  . diclofenac (VOLTAREN) 75 MG EC tablet Take 1 tablet (75 mg total) by mouth 2 (two) times daily.  With food.  30 tablet  0  . DULoxetine (CYMBALTA) 30 MG capsule Take 1 capsule (30 mg total) by mouth daily.  30 capsule  3  . HYDROcodone-acetaminophen (NORCO/VICODIN) 5-325 MG per tablet Take 1-2 tablets by mouth at bedtime as needed for pain.  30 tablet  0  . insulin glargine (LANTUS) 100 UNIT/ML injection Inject 15 units at bedtime  10 mL  3  . Liraglutide (VICTOZA) 18 MG/3ML SOLN Take 1.8 mg once daily.  18 mL  3  . meloxicam (MOBIC) 15 MG tablet Take 1 tablet (15 mg total) by mouth daily.  31 tablet  3  . nitroGLYCERIN (NITRODUR - DOSED IN MG/24 HR) 0.1 mg/hr 1/2 patch daily to feet  30 patch  2  . PARoxetine (PAXIL) 20 MG tablet Take 1 tablet (20 mg total) by mouth at bedtime.  30 tablet  3  . pravastatin (PRAVACHOL) 40 MG tablet Take 1 tablet (40 mg total) by mouth daily.  30 tablet  11  . traMADol (ULTRAM) 50 MG tablet Take 2 tablets (100 mg total) by mouth every 8 (eight) hours as needed for pain.  90 tablet  0    Allergies: Allergies  Allergen Reactions  . Ofloxacin     REACTION: rash, throat itching, tightness      Objective:   Physical Exam: Filed Vitals:   06/23/12 1538  BP: 110/73  Pulse: 63  Temp: 97.3 F (36.3 C)    General: Vital signs reviewed and noted. Well-developed, well-nourished, in no acute distress; alert, appropriate and cooperative throughout examination. Head: Normocephalic, atraumatic Lungs: Normal respiratory effort. Clear to auscultation BL without crackles or wheezes. Heart: RRR. S1 and S2 normal without gallop, murmur, or rubs. Abdomen:BS normoactive. Soft, Nondistended, non-tender.  No masses or organomegaly. Extremities: No pretibial edema.     Assessment & Plan:

## 2012-06-24 NOTE — Assessment & Plan Note (Signed)
Her A1c has improved from 10.3 on 04/07/12 to 8.7 on 06/23/2012. She notes compliance with her diabetic regimen. Patient was congratulated on the improvement and encouraged to continue working on her diabetes. She was encouraged to buy her own strips and check her blood sugars regularly. We talked at length about diet and weight control that would in turn help in better CBG control. For hypoglycemic events that she noted on 2 occasions when she had an early dinner, she was advised to take a snack around 9 or 10 PM when she goes to bed with her insulin shot. She was also encouraged to keep a snack on her bedside as well. -Continue her on the same regimen for now.

## 2012-06-24 NOTE — Assessment & Plan Note (Signed)
Patient has not had surgical excisions done at this time but will need to have surgery for her plantar fibromatosis bilaterally. Patient has failed conservative therapy at this time. I think this is her best option. Patient is more than welcome to come back for any other physical complaints that we can be helpful with. Patient will be making a appointment with Dr. Lajoyce Corners in the very near future. Encourage her to get the Paxil as well as the Cymbalta filled when possible this I do think they will be beneficial.

## 2012-06-24 NOTE — Assessment & Plan Note (Signed)
She was counseled on medication adherence. Continue current dose of pravastatin for now.

## 2012-06-24 NOTE — Assessment & Plan Note (Signed)
She continues to complain of aches and pains all over her body in her- shoulder, back, hip and knees which favorfibromyalgia. Unfortunately she was not able to fill the prescription for her Cymbalta yet. Would call the [facility] pharmacy to followup on her medication. In the meantime she was started on Paxil by sports medicine clinic.

## 2012-06-24 NOTE — Patient Instructions (Signed)
Always good to see you. I'm sorry that are interventions has seemed to fail. I do think he should go back to Dr. Lajoyce Corners and likely he'll need surgical intervention for fibromas. I wish you happy holidays and happy new year please do not hesitate to call us if there's anything else we can do.

## 2012-06-25 NOTE — Assessment & Plan Note (Signed)
Refer her for mammogram.

## 2012-06-26 ENCOUNTER — Encounter: Payer: Self-pay | Admitting: *Deleted

## 2012-06-26 NOTE — Progress Notes (Signed)
Pt presents stating that sports med told her she needed surgical intervention for her feet, states they told her that there was nothing else they could do. appt made per [place]. 12/16 at 0845 dr Lavena Bullion

## 2012-06-30 ENCOUNTER — Ambulatory Visit (INDEPENDENT_AMBULATORY_CARE_PROVIDER_SITE_OTHER): Payer: No Typology Code available for payment source | Admitting: Radiation Oncology

## 2012-06-30 ENCOUNTER — Encounter: Payer: Self-pay | Admitting: Radiation Oncology

## 2012-06-30 VITALS — BP 111/79 | HR 76 | Temp 98.1°F | Wt 216.9 lb

## 2012-06-30 DIAGNOSIS — M722 Plantar fascial fibromatosis: Secondary | ICD-10-CM

## 2012-06-30 DIAGNOSIS — IMO0001 Reserved for inherently not codable concepts without codable children: Secondary | ICD-10-CM

## 2012-06-30 DIAGNOSIS — M797 Fibromyalgia: Secondary | ICD-10-CM

## 2012-06-30 DIAGNOSIS — E119 Type 2 diabetes mellitus without complications: Secondary | ICD-10-CM

## 2012-06-30 NOTE — Assessment & Plan Note (Addendum)
Patient states she is still waiting her Paxil and Cymbalta prescriptions to be filled per her medication assistance program (MAP).

## 2012-06-30 NOTE — Progress Notes (Signed)
  Subjective:    Patient ID: [name], female    DOB: 02-12-1961, 51 y.o.   MRN: [id number]  HPI Patient is a 51 year old woman with past medical history significant for plantar fibromatosis (for comprehensive PMH, see "Problem List"), who presents to clinic today with a request for referral to orthopedic surgery. The patient is followed by Dr. Katrinka Blazing of sports medicine for her plantar fibromatosis, and at her most recent visit was told that at this time she will need surgical excision of her bilateral plantar fibromas for relief of her symptoms, given that her symptoms have been recalcitrant to multiple corticosteroid injections. The patient complains of bilateral foot pain, which is chronic and unchanged. She states she otherwise feels well and denies any other complaints today.  Review of Systems  All other systems reviewed and are negative.       Objective:   Physical Exam  Constitutional: She is oriented to person, place, and time. She appears well-developed and well-nourished. No distress.  HENT:  Head: Normocephalic and atraumatic.  Eyes: Pupils are equal, round, and reactive to light. No scleral icterus.  Neck: Normal range of motion. Neck supple. No tracheal deviation present.  Cardiovascular: Normal rate and regular rhythm.   No murmur heard. Pulmonary/Chest: Effort normal. She has no wheezes. She has no rales.  Abdominal: Soft. Bowel sounds are normal. She exhibits no distension. There is no tenderness.  Musculoskeletal: Normal range of motion. She exhibits no edema.       Right foot: She exhibits normal range of motion, no bony tenderness and no deformity.       R foot: Two palpable ~1-1.5cm plantar cysts at midfoot with tenderness to palpation. No associated erythema or increased warmth.  L foot: Two palpable ~1cm plantar cysts at midfoot with tenderness to palpation. No associated erythema or increased warmth.   Neurological: She is alert and oriented to person,  place, and time. No cranial nerve deficit.  Skin: Skin is warm and dry. No erythema.  Psychiatric: She has a normal mood and affect. Her behavior is normal.          Assessment & Plan:

## 2012-06-30 NOTE — Patient Instructions (Signed)
Instructions: Please return to clinic in 3 months for follow-up on your diabetes. Please continue to check your blood sugar regularly, and bring your meter with you at time of your next appointment.

## 2012-06-30 NOTE — Assessment & Plan Note (Signed)
Lab Results  Component Value Date   HGBA1C 8.7 06/23/2012   HGBA1C 10.3 04/07/2012   HGBA1C 8.6 08/30/2011     Assessment:  Diabetes control: fair control  Progress toward A1C goal:  improved  Comments: Good glycemic control per home glucose monitor.   Plan:  Medications:  continue current medications  Home glucose monitoring:   Frequency:     Timing:    Instruction/counseling given: reminded to bring blood glucose meter & log to each visit  Educational resources provided: brochure  Self management tools provided:    Other plans: Patient will RTC in 3 months for repeat A1c.

## 2012-06-30 NOTE — Assessment & Plan Note (Signed)
Patient will be referred to orthopedic surgery for surgical excision of her bilateral plantar fibromas. Given that the patient has the orange card, she will likely be seen early in 2014 for the issue. -Refer to orthopedics

## 2012-07-14 ENCOUNTER — Other Ambulatory Visit: Payer: Self-pay | Admitting: *Deleted

## 2012-07-14 DIAGNOSIS — M722 Plantar fascial fibromatosis: Secondary | ICD-10-CM

## 2012-07-15 MED ORDER — HYDROCODONE-ACETAMINOPHEN 5-325 MG PO TABS: 1.0000 | ORAL_TABLET | Freq: Every evening | ORAL | Status: DC | PRN

## 2012-07-15 MED ORDER — TRAMADOL HCL 50 MG PO TABS: 50.0000 mg | ORAL_TABLET | Freq: Three times a day (TID) | ORAL | Status: DC | PRN

## 2012-08-06 ENCOUNTER — Encounter: Payer: Self-pay | Admitting: Internal Medicine

## 2012-08-13 ENCOUNTER — Other Ambulatory Visit: Payer: Self-pay | Admitting: *Deleted

## 2012-08-13 DIAGNOSIS — M722 Plantar fascial fibromatosis: Secondary | ICD-10-CM

## 2012-08-13 MED ORDER — HYDROCODONE-ACETAMINOPHEN 5-325 MG PO TABS: 1.0000 | ORAL_TABLET | Freq: Every evening | ORAL | Status: DC | PRN

## 2012-08-13 NOTE — Telephone Encounter (Signed)
FYI states that she gets tramadol from Dr Lajoyce Corners

## 2012-08-13 NOTE — Telephone Encounter (Signed)
Called hydrocodone to pharm

## 2012-08-15 ENCOUNTER — Other Ambulatory Visit: Payer: Self-pay | Admitting: *Deleted

## 2012-08-15 DIAGNOSIS — M722 Plantar fascial fibromatosis: Secondary | ICD-10-CM

## 2012-08-15 MED ORDER — TRAMADOL HCL 50 MG PO TABS: 50.0000 mg | ORAL_TABLET | Freq: Three times a day (TID) | ORAL | Status: DC | PRN

## 2012-08-15 NOTE — Telephone Encounter (Signed)
Call pt when ready @ [phone] Pt states she has been released from sports med and we are doing all her pain management. Will you refill Tramadol?

## 2012-08-15 NOTE — Telephone Encounter (Signed)
I will this one time only bc Dr Dorthula Rue is away. She will need to change the FYI in order for the [facility] to fill this again.

## 2012-08-28 ENCOUNTER — Encounter: Payer: No Typology Code available for payment source | Admitting: Internal Medicine

## 2012-09-03 ENCOUNTER — Ambulatory Visit (INDEPENDENT_AMBULATORY_CARE_PROVIDER_SITE_OTHER): Payer: No Typology Code available for payment source | Admitting: Internal Medicine

## 2012-09-03 ENCOUNTER — Encounter: Payer: Self-pay | Admitting: Internal Medicine

## 2012-09-03 VITALS — BP 111/74 | HR 77 | Temp 97.3°F | Ht 62.0 in | Wt 217.9 lb

## 2012-09-03 DIAGNOSIS — M797 Fibromyalgia: Secondary | ICD-10-CM

## 2012-09-03 DIAGNOSIS — Z0000 Encounter for general adult medical examination without abnormal findings: Secondary | ICD-10-CM

## 2012-09-03 DIAGNOSIS — E119 Type 2 diabetes mellitus without complications: Secondary | ICD-10-CM

## 2012-09-03 DIAGNOSIS — E785 Hyperlipidemia, unspecified: Secondary | ICD-10-CM

## 2012-09-03 DIAGNOSIS — IMO0001 Reserved for inherently not codable concepts without codable children: Secondary | ICD-10-CM

## 2012-09-03 DIAGNOSIS — M722 Plantar fascial fibromatosis: Secondary | ICD-10-CM

## 2012-09-03 DIAGNOSIS — K219 Gastro-esophageal reflux disease without esophagitis: Secondary | ICD-10-CM

## 2012-09-03 LAB — GLUCOSE, CAPILLARY: Glucose-Capillary: 182 mg/dL — ABNORMAL HIGH (ref 70–99)

## 2012-09-03 MED ORDER — PRAVASTATIN SODIUM 40 MG PO TABS: 40.0000 mg | ORAL_TABLET | Freq: Every day | ORAL | Status: DC

## 2012-09-03 MED ORDER — OMEPRAZOLE 20 MG PO CPDR: 20.0000 mg | DELAYED_RELEASE_CAPSULE | Freq: Every day | ORAL | Status: DC

## 2012-09-03 NOTE — Assessment & Plan Note (Signed)
Referral for mammogram was placed. 

## 2012-09-03 NOTE — Patient Instructions (Addendum)
General Instructions: Please schedule a follow up appointment in 1-2 months  Please bring your medication bottles with your next appointment. Please take your medicines as prescribed. I will call you with your lab results if anything will be abnormal.    Treatment Goals:  Goals (1 Years of Data) as of 09/03/12         06/23/12 05/23/12 04/07/12 08/30/11     Result Component    . HEMOGLOBIN A1C < 6.5  8.7  10.3 8.6    . LDL CALC < 100   133  113    . LDL CALC < 100   133  113      Progress Toward Treatment Goals:  Treatment Goal 09/03/2012  Hemoglobin A1C improved    Self Care Goals & Plans:  Self Care Goal 09/03/2012  Manage my medications take my medicines as prescribed; bring my medications to every visit; refill my medications on time; follow the sick day instructions if I am sick  Monitor my health keep track of my blood glucose; bring my glucose meter and log to each visit; keep track of my blood pressure; bring my blood pressure log to each visit  Eat healthy foods -  Be physically active -    Home Blood Glucose Monitoring 09/03/2012  Check my blood sugar 3 times a day  When to check my blood sugar before meals     Care Management & Community Referrals:  Referral 09/03/2012  Referrals made for care management support none needed

## 2012-09-03 NOTE — Assessment & Plan Note (Signed)
She has not been able to see any orthopedic doctor so far.  No openings available for this month through [facility]. We'll try to get her to be seen at [facility] or [facility].  -Her pain contract was renewed -added tramadol to Vicodin.  - Check UDS( reports taking  her vicodin this AM)

## 2012-09-03 NOTE — Progress Notes (Signed)
Copy of pain contract given to pt. 

## 2012-09-03 NOTE — Assessment & Plan Note (Signed)
She did not get her meter today but did report a hypoglycemic event lately when her blood sugars dropped to 69. Patient takes both the victosa and Lantus at bedtime. She was advised to take Victoza in the morning and Lantus at the bedtime that would prevent her blood sugars from dropping in the night. -Return to clinic in one month for HbA1c check. -Advised to bring her gluco- meter with next clinic appointment -Her foot exam was done today

## 2012-09-03 NOTE — Assessment & Plan Note (Addendum)
She continues to report aches and pains all over her body. She was finally able to pick up her Cymbalta from [facility] pharmacy but tells me that apparently has been just approved for 1 month. She did not like Paxil because it's side effects like mood alterations. - Continue Cymbalta - Would communicate with [facility] pharmacy regarding issues with Cymbalta.  We called the [facility] pharmacy and they confirmed that patient picked up her prescription on 08/13/2012  for cymbalta and she has 2 more refills on it that she can get without any problem.

## 2012-09-03 NOTE — Progress Notes (Signed)
Subjective:   Patient ID: [name] female   DOB: [birth year] 52 y.o.   MRN: [id number]  HPI:  52 year old woman with past medical history significant for type 2 diabetes mellitus, plantar fasciitis, fibromyalgia presents to the clinic for a followup visit.  Fibromyalgia: Patient reports that she has been having increasing pain all over her body. She states except 2 siblings all of her family members have fibromyalgia. She has to sometimes take 2 Vicodins at night. She was given a prescription for Cymbalta few months ago, but it just got available to her from  [facility] health department pharmacy.   DM: Her CBGs are running in high 100s to 200s. She did not bring her meter today. She did report having a hypoglycemic event lately when her blood sugar dropped to 69 and she had to get up and eat. She takes both victoza and lantus at night.   Plantar fascitis: She continues to report pain in her feet from plantar fasciitis but she states that she has learnt to deal with it. She is still waiting to hear back from [name] on her orthopedic referral for possible surgery.  She also reports having feeling of burning pain in her belly with decreased appetite for last few months. This is associated with nausea but denies any vomiting. Denies any weight loss or blood in her stools.  Past Medical History  Diagnosis Date  . Adhesive capsulitis of right shoulder     with underlying tendinopathy rotator cuff  . History of post-sterilization tuboplasty 2000  . Tear of meniscus of left knee     x2  . Tear of meniscus of right knee   . Plantar fasciitis     Right  . Shortness of breath     with exertion  . Diabetes mellitus     oral tx  . GERD (gastroesophageal reflux disease)   . Arthritis   . Sleep apnea 5 plus yrs    study -pt could not sleep test inconclusive.    Family History  Problem Relation Age of Onset  . Cancer Sister     both sisters have breast cancer   History   Social History  .  Marital Status: Married    Spouse Name: N/A    Number of Children: N/A  . Years of Education: N/A   Occupational History  . Not on file.   Social History Main Topics  . Smoking status: Never Smoker   . Smokeless tobacco: Never Used  . Alcohol Use: No  . Drug Use: No  . Sexually Active: Not on file   Other Topics Concern  . Not on file   Social History Narrative   Married, unemployed, Husband disabled and paraplegic 2/2 fall from tree stand while deer hunting, Son quadriplegic 2/2 MVA 05/2007 in [place]   Review of Systems: General: Denies fever, chills, diaphoresis, appetite change and fatigue. HEENT: Denies photophobia, eye pain, redness, hearing loss, ear pain, congestion, sore throat, rhinorrhea, sneezing, mouth sores, trouble swallowing, neck pain, neck stiffness and tinnitus. Respiratory: Denies SOB, DOE, cough, chest tightness, and wheezing. Cardiovascular: Denies to chest pain, palpitations and leg swelling. Gastrointestinal: Denies nausea, vomiting, abdominal pain, diarrhea, constipation, blood in stool and abdominal distention. Genitourinary: Denies dysuria, urgency, frequency, hematuria, flank pain and difficulty urinating. Musculoskeletal: Denies myalgias, back pain, joint swelling, and gait problem, + arthralgias .  Skin: Denies pallor, rash and wound. Neurological: Denies dizziness, seizures, syncope, weakness, light-headedness, numbness and headaches. Hematological: Denies adenopathy, easy bruising, personal or  family bleeding history. Psychiatric/Behavioral: Denies suicidal ideation, mood changes, confusion, nervousness, sleep disturbance and agitation.    Current Outpatient Medications: Current Outpatient Prescriptions  Medication Sig Dispense Refill  . diclofenac (VOLTAREN) 75 MG EC tablet Take 1 tablet (75 mg total) by mouth 2 (two) times daily. With food.  30 tablet  0  . DULoxetine (CYMBALTA) 30 MG capsule Take 1 capsule (30 mg total) by mouth daily.  30  capsule  3  . HYDROcodone-acetaminophen (NORCO/VICODIN) 5-325 MG per tablet Take 1 tablet by mouth at bedtime as needed for pain.  30 tablet  0  . insulin glargine (LANTUS) 100 UNIT/ML injection Inject 15 units at bedtime  10 mL  3  . Liraglutide (VICTOZA) 18 MG/3ML SOLN Take 1.8 mg once daily.  18 mL  3  . meloxicam (MOBIC) 15 MG tablet Take 1 tablet (15 mg total) by mouth daily.  31 tablet  3  . nitroGLYCERIN (NITRODUR - DOSED IN MG/24 HR) 0.1 mg/hr 1/2 patch daily to feet  30 patch  2  . PARoxetine (PAXIL) 20 MG tablet Take 1 tablet (20 mg total) by mouth at bedtime.  30 tablet  3  . pravastatin (PRAVACHOL) 40 MG tablet Take 1 tablet (40 mg total) by mouth daily.  30 tablet  11  . traMADol (ULTRAM) 50 MG tablet Take 2 tablets (100 mg total) by mouth every 8 (eight) hours as needed for pain.  90 tablet  0  . traMADol (ULTRAM) 50 MG tablet Take 1 tablet (50 mg total) by mouth every 8 (eight) hours as needed for pain.  180 tablet  0   No current facility-administered medications for this visit.    Allergies: Allergies  Allergen Reactions  . Ofloxacin     REACTION: rash, throat itching, tightness      Objective:   Physical Exam: Filed Vitals:   09/03/12 1354  BP: 111/74  Pulse: 77  Temp: 97.3 F (36.3 C)    General: Vital signs reviewed and noted. Well-developed, well-nourished, in no acute distress; alert, appropriate and cooperative throughout examination. Head: Normocephalic, atraumatic Lungs: Normal respiratory effort. Clear to auscultation BL without crackles or wheezes. Heart: RRR. S1 and S2 normal without gallop, murmur, or rubs. Abdomen:BS normoactive. Soft, Nondistended, non-tender.  No masses or organomegaly. Extremities: No pretibial edema.     Assessment & Plan:

## 2012-09-04 ENCOUNTER — Encounter: Payer: No Typology Code available for payment source | Admitting: Internal Medicine

## 2012-09-04 LAB — PRESCRIPTION ABUSE MONITORING 15P, URINE
Amphetamine/Meth: NEGATIVE ng/mL
Barbiturate Screen, Urine: NEGATIVE ng/mL
Benzodiazepine Screen, Urine: NEGATIVE ng/mL
Buprenorphine, Urine: NEGATIVE ng/mL
Cannabinoid Scrn, Ur: NEGATIVE ng/mL
Carisoprodol, Urine: NEGATIVE ng/mL
Cocaine Metabolites: NEGATIVE ng/mL
Creatinine, Urine: 306.83 mg/dL (ref >20.0)
Fentanyl, Ur: NEGATIVE ng/mL
Meperidine, Ur: NEGATIVE ng/mL
Methadone Screen, Urine: NEGATIVE ng/mL
Oxycodone Screen, Ur: NEGATIVE ng/mL
Propoxyphene: NEGATIVE ng/mL
Zolpidem, Urine: NEGATIVE ng/mL

## 2012-09-05 LAB — TRAMADOL, URINE
N-DESMETHYL-CIS-TRAMADOL: >1000 ng/mL
Tramadol, Urine: >10000 ng/mL

## 2012-09-05 LAB — OPIATES/OPIOIDS (LC/MS-MS)
Codeine Urine: NEGATIVE ng/mL
Heroin (6-AM), UR: NEGATIVE ng/mL
Hydrocodone: 2455 ng/mL
Hydromorphone: 264 ng/mL
Morphine Urine: NEGATIVE ng/mL
Norhydrocodone, Ur: 2191 ng/mL
Noroxycodone, Ur: NEGATIVE ng/mL
Oxycodone, ur: NEGATIVE ng/mL
Oxymorphone: NEGATIVE ng/mL

## 2012-09-08 ENCOUNTER — Other Ambulatory Visit: Payer: Self-pay | Admitting: *Deleted

## 2012-09-08 DIAGNOSIS — M722 Plantar fascial fibromatosis: Secondary | ICD-10-CM

## 2012-09-08 NOTE — Telephone Encounter (Signed)
Last refill onTramadol 1/31, send to [facility] Last refill vicodin 1/29

## 2012-09-09 ENCOUNTER — Ambulatory Visit (HOSPITAL_COMMUNITY): Payer: No Typology Code available for payment source

## 2012-09-11 ENCOUNTER — Other Ambulatory Visit: Payer: Self-pay | Admitting: *Deleted

## 2012-09-11 ENCOUNTER — Ambulatory Visit (HOSPITAL_COMMUNITY): Payer: No Typology Code available for payment source

## 2012-09-11 DIAGNOSIS — M722 Plantar fascial fibromatosis: Secondary | ICD-10-CM

## 2012-09-11 NOTE — Telephone Encounter (Signed)
Tramadol to be filled at [facility] pharmacy.  And Vicodin needs to be called to [facility].

## 2012-09-12 MED ORDER — TRAMADOL HCL 50 MG PO TABS: 50.0000 mg | ORAL_TABLET | Freq: Three times a day (TID) | ORAL | Status: DC | PRN

## 2012-09-12 MED ORDER — HYDROCODONE-ACETAMINOPHEN 5-325 MG PO TABS: 1.0000 | ORAL_TABLET | Freq: Every evening | ORAL | Status: DC | PRN

## 2012-09-12 NOTE — Telephone Encounter (Signed)
Tramadol rx called to [facility] and Hydrocodone 5/325mg  rx called to [facility] pharmacy.

## 2012-09-17 ENCOUNTER — Telehealth: Payer: Self-pay | Admitting: *Deleted

## 2012-09-17 NOTE — Telephone Encounter (Signed)
Patient is suppose to be on tramadol 100 mg q8 , the quantity would be 90 pills  for that for 1 month If we give her tramdol 50 mg tabs, then qty would be 180.  [name]

## 2012-09-17 NOTE — Telephone Encounter (Signed)
Call from [place] at [facility]; stated pt has been taking Tramadol 2 tabs q8hr; last refill was for 1 tablet. Also should qty be 90 or 180? Please clarify. Thanks

## 2012-09-23 ENCOUNTER — Ambulatory Visit (HOSPITAL_COMMUNITY): Payer: No Typology Code available for payment source

## 2012-10-01 ENCOUNTER — Ambulatory Visit: Payer: No Typology Code available for payment source | Admitting: Internal Medicine

## 2012-10-07 ENCOUNTER — Ambulatory Visit: Payer: No Typology Code available for payment source

## 2012-10-07 ENCOUNTER — Ambulatory Visit (HOSPITAL_COMMUNITY): Payer: No Typology Code available for payment source

## 2012-10-13 ENCOUNTER — Ambulatory Visit: Payer: Self-pay | Admitting: Internal Medicine

## 2012-10-14 ENCOUNTER — Other Ambulatory Visit: Payer: Self-pay | Admitting: *Deleted

## 2012-10-14 DIAGNOSIS — M722 Plantar fascial fibromatosis: Secondary | ICD-10-CM

## 2012-10-15 MED ORDER — TRAMADOL HCL 50 MG PO TABS: 50.0000 mg | ORAL_TABLET | Freq: Three times a day (TID) | ORAL | Status: DC | PRN

## 2012-10-15 NOTE — Telephone Encounter (Signed)
Called to pharm 

## 2012-10-16 ENCOUNTER — Ambulatory Visit: Payer: Self-pay

## 2012-10-17 ENCOUNTER — Ambulatory Visit: Payer: Self-pay

## 2012-10-21 ENCOUNTER — Ambulatory Visit: Payer: Self-pay

## 2012-11-20 ENCOUNTER — Other Ambulatory Visit: Payer: Self-pay | Admitting: *Deleted

## 2012-11-20 DIAGNOSIS — M722 Plantar fascial fibromatosis: Secondary | ICD-10-CM

## 2012-11-20 MED ORDER — TRAMADOL HCL 50 MG PO TABS: 50.0000 mg | ORAL_TABLET | Freq: Three times a day (TID) | ORAL | Status: DC | PRN

## 2012-11-20 MED ORDER — HYDROCODONE-ACETAMINOPHEN 10-325 MG PO TABS: 1.0000 | ORAL_TABLET | Freq: Three times a day (TID) | ORAL | Status: DC | PRN

## 2012-11-21 NOTE — Telephone Encounter (Signed)
Spoke w/ pt, she states she needs something more than vicodin, makes appt, tramadol called to health dept, vicodin not called to pharm

## 2012-11-25 ENCOUNTER — Ambulatory Visit: Payer: No Typology Code available for payment source | Admitting: Internal Medicine

## 2012-11-26 ENCOUNTER — Ambulatory Visit: Payer: No Typology Code available for payment source | Admitting: Internal Medicine

## 2012-11-28 ENCOUNTER — Other Ambulatory Visit: Payer: Self-pay | Admitting: *Deleted

## 2012-11-28 DIAGNOSIS — M722 Plantar fascial fibromatosis: Secondary | ICD-10-CM

## 2012-11-28 MED ORDER — HYDROCODONE-ACETAMINOPHEN 10-325 MG PO TABS: 1.0000 | ORAL_TABLET | Freq: Three times a day (TID) | ORAL | Status: DC | PRN

## 2012-11-28 NOTE — Telephone Encounter (Signed)
Can not do more than #30 as her pain contract is for 30 tabs of vicodin and 90 of tramdol( 100 mg q8). I just refilled her tramadol on 5/8.  Thanks, [employer]

## 2012-11-28 NOTE — Telephone Encounter (Signed)
Would like amount increased from 20, possibly #90

## 2012-11-28 NOTE — Telephone Encounter (Signed)
Called to pharm 

## 2012-12-02 ENCOUNTER — Ambulatory Visit (INDEPENDENT_AMBULATORY_CARE_PROVIDER_SITE_OTHER): Payer: No Typology Code available for payment source | Admitting: Internal Medicine

## 2012-12-02 ENCOUNTER — Encounter: Payer: Self-pay | Admitting: Internal Medicine

## 2012-12-02 VITALS — BP 132/76 | HR 64 | Temp 97.0°F | Ht 60.75 in | Wt 205.7 lb

## 2012-12-02 DIAGNOSIS — IMO0001 Reserved for inherently not codable concepts without codable children: Secondary | ICD-10-CM

## 2012-12-02 DIAGNOSIS — N926 Irregular menstruation, unspecified: Secondary | ICD-10-CM

## 2012-12-02 DIAGNOSIS — N946 Dysmenorrhea, unspecified: Secondary | ICD-10-CM | POA: Insufficient documentation

## 2012-12-02 DIAGNOSIS — M797 Fibromyalgia: Secondary | ICD-10-CM

## 2012-12-02 DIAGNOSIS — E785 Hyperlipidemia, unspecified: Secondary | ICD-10-CM

## 2012-12-02 DIAGNOSIS — E119 Type 2 diabetes mellitus without complications: Secondary | ICD-10-CM

## 2012-12-02 DIAGNOSIS — K219 Gastro-esophageal reflux disease without esophagitis: Secondary | ICD-10-CM

## 2012-12-02 DIAGNOSIS — G4700 Insomnia, unspecified: Secondary | ICD-10-CM | POA: Insufficient documentation

## 2012-12-02 DIAGNOSIS — M791 Myalgia, unspecified site: Secondary | ICD-10-CM

## 2012-12-02 DIAGNOSIS — M722 Plantar fascial fibromatosis: Secondary | ICD-10-CM

## 2012-12-02 LAB — CK: Total CK: 47 U/L (ref 7–177)

## 2012-12-02 LAB — SEDIMENTATION RATE: Sed Rate: 4 mm/hr (ref 0–22)

## 2012-12-02 LAB — POCT GLYCOSYLATED HEMOGLOBIN (HGB A1C): Hemoglobin A1C: 10

## 2012-12-02 LAB — GLUCOSE, CAPILLARY: Glucose-Capillary: 365 mg/dL — ABNORMAL HIGH (ref 70–99)

## 2012-12-02 MED ORDER — INSULIN GLARGINE 100 UNIT/ML ~~LOC~~ SOLN: SUBCUTANEOUS | Status: DC

## 2012-12-02 MED ORDER — OMEPRAZOLE 20 MG PO CPDR: 20.0000 mg | DELAYED_RELEASE_CAPSULE | Freq: Every day | ORAL | Status: DC

## 2012-12-02 MED ORDER — AMITRIPTYLINE HCL 25 MG PO TABS: 25.0000 mg | ORAL_TABLET | Freq: Every day | ORAL | Status: DC

## 2012-12-02 MED ORDER — PRAVASTATIN SODIUM 40 MG PO TABS: 40.0000 mg | ORAL_TABLET | Freq: Every day | ORAL | Status: DC

## 2012-12-02 NOTE — Assessment & Plan Note (Signed)
Patient was noted to have history of myalgia and joint pain on a daily basis which decreases her daily function. She reports about morning stiffness. Her sister was diagnosed with lupus. I will evaluate patient for connective tissue disease. I will obtain ANA, CRP, ESR, rheumatoid factor, CK, TSH .  I recommended to continue exercise and when necessary tramadol. I would discontinue Norco at this point since patient did notice significant improvement of her pain

## 2012-12-02 NOTE — Progress Notes (Signed)
Subjective:   Patient ID: [name] female   DOB: 25-Nov-1960 52 y.o.   MRN: [id number]  HPI: [name] is a 52 y.o. female with past days he significant as outlined below who presented to the clinic for generalized pain. Patient noted that her fibromyalgia is acting up. She was wondering if she could get soma like her sister who also has fibromyalgia. Patient is again having a lot of stress since she is taking care of her son and her husband. Patient noted that she is experiences spasm everyday and pain all over the pain. Patient noted she is doing stretching and exercise  Every day. She also noted that she is experiencing trouble staying asleep due to restlessness and stretching.  Wakes in the morning and is very stiff. After 1 hour she is able to move her joints along with tramadol.  Denies any joint swelling, rashes.  Patient is constantly working on cars and   Patient did not bring her meter with her but reports that she has episodes of low and high sugars.  She drinks a lot of Gatorade.   Frequent and painful periods: Patient noted her periods has been no prolonged from 3-7 days. They also occur more often every 2-3 weeks. She is experiencing cramps with it.   Past Medical History  Diagnosis Date  . Adhesive capsulitis of right shoulder     with underlying tendinopathy rotator cuff  . History of post-sterilization tuboplasty 2000  . Tear of meniscus of left knee     x2  . Tear of meniscus of right knee   . Plantar fasciitis     Right  . Shortness of breath     with exertion  . Diabetes mellitus     oral tx  . GERD (gastroesophageal reflux disease)   . Arthritis   . Sleep apnea 5 plus yrs    study -pt could not sleep test inconclusive.    Current Outpatient Prescriptions  Medication Sig Dispense Refill  . amitriptyline (ELAVIL) 25 MG tablet Take 1 tablet (25 mg total) by mouth at bedtime.  30 tablet  0  . insulin glargine (LANTUS) 100 UNIT/ML injection  Inject 15 units at bedtime  10 mL    . omeprazole (PRILOSEC) 20 MG capsule Take 1 capsule (20 mg total) by mouth daily.  30 capsule  1  . pravastatin (PRAVACHOL) 40 MG tablet Take 1 tablet (40 mg total) by mouth daily.  30 tablet  11  . traMADol (ULTRAM) 50 MG tablet Take 1 tablet (50 mg total) by mouth every 8 (eight) hours as needed for pain.  180 tablet  0   No current facility-administered medications for this visit.   Family History  Problem Relation Age of Onset  . Cancer Sister     both sisters have breast cancer   History   Social History  . Marital Status: Married    Spouse Name: N/A    Number of Children: N/A  . Years of Education: N/A   Social History Main Topics  . Smoking status: Never Smoker   . Smokeless tobacco: Never Used  . Alcohol Use: No  . Drug Use: No  . Sexually Active: None   Other Topics Concern  . None   Social History Narrative   Married, unemployed, Husband disabled and paraplegic 2/2 fall from tree stand while deer hunting, Son quadriplegic 2/2 MVA 05/2007 in [place]   Review of Systems: Constitutional: Denies fever, chills, diaphoresis noted  appetite change  and fatigue.  HEENT: Denies eye pain,hearing loss, ear pain, congestion, sore throat, rhinorrhea, mouth sores, trouble swallowing, neck pain, neck stiffness and tinnitus.   Respiratory: Denies SOB, DOE, cough, chest tightness,  and wheezing.   Cardiovascular: Denies chest pain, palpitations and leg swelling.  Gastrointestinal: Denies nausea, vomiting, abdominal pain, diarrhea, constipation, blood in stool and abdominal distention.  Genitourinary: Denies dysuria, urgency, frequency, hematuria, flank pain and difficulty urinating.  Endocrine: Denies: hot or cold intolerance, sweats, changes in hair or nails, polyuria, polydipsia.  Skin: Denies pallor, rash and wound.  Neurological: Denies dizziness, weakness, light-headedness, numbness and headaches.  Hematological: Denies  adenopathy.  Objective:  Physical Exam: Filed Vitals:   12/02/12 1432  BP: 132/76  Pulse: 64  Temp: 97 F (36.1 C)  TempSrc: Oral  Height: 5' 0.75" (1.543 m)  Weight: 205 lb 11.2 oz (93.305 kg)  SpO2: 98%   Constitutional: Vital signs reviewed.  Patient is a well-developed and well-nourished female in no acute distress and cooperative with exam. Alert and oriented x3.  Mouth: no erythema or exudates, MMM Eyes: PERRL, EOMI, conjunctivae normal, No scleral icterus.  Neck: Supple,  Cardiovascular: RRR, S1 normal, S2 normal, no MRG, pulses symmetric and intact bilaterally Pulmonary/Chest: CTAB, no wheezes, rales, or rhonchi Abdominal: Soft. Non-tender, non-distended, bowel sounds are normal,  GU: no CVA tenderness Musculoskeletal: No joint deformities, erythema, or stiffness, ROM full but mildly  Tender to palpation throughout Hematology: no cervical adenopathy.  Neurological: A&O x3, Strength is normal and symmetric bilaterally, cranial nerve II-XII are grossly intact, no focal motor deficit, sensory intact to light touch bilaterally.  Skin: Warm, dry and intact. No rash, cyanosis, or clubbing.

## 2012-12-02 NOTE — Assessment & Plan Note (Signed)
I will start patient on amitriptyline. This will possibly help her muscle spasm as well.

## 2012-12-02 NOTE — Assessment & Plan Note (Signed)
Patient will be referred to gynecology for further outpatient management.

## 2012-12-02 NOTE — Patient Instructions (Addendum)
1. Start taking Lantus 15 units daily. Eat a bed time snack 2. Check blood sugars every morning before breakfast 3. If you note is low blood sugars below 70 please call the clinic for further management 4. Stay away from Gatorade!!!! 5. I will start you on amitriptyline 25 mg daily. Please take it before bedtime.It will help with sleep, muscle spasm and restlessness. It causes dry mouth so make sure that you have a glass of water next you that all the time.

## 2012-12-02 NOTE — Assessment & Plan Note (Signed)
Reading the chart patient was supposed to take Victoza and Lantus 15 units but patient is currently only taking Lantus 10 units daily. She reports she had one episodes of low blood sugars in the 40s she did not take  any food that day. Patient currently is drinking a lot of Gatorade. Medical noncompliance and stress at home are most likely the reason for her hemoglobin A1c of 10 today. 5 months ago it was 8.7. I underlined the importance about controlling her diet and adherence to medication. At this point I would start patient back on Lantus 15 units daily. Patient has trouble it to obtain the toes therefore I will hold off on refilling this at this point. I instructed to check her blood sugars on a daily basis before breakfast. She noted she is not able to check her blood sugars more often. Will allow a patient in one month. She was asked to bring her meter with her. She is experiencing further low blood sugars she needs to call the clinic for the instruction.

## 2012-12-03 LAB — C-REACTIVE PROTEIN: CRP: <0.5 mg/dL (ref <0.60)

## 2012-12-03 LAB — ANA: Anti Nuclear Antibody(ANA): NEGATIVE

## 2012-12-03 LAB — RHEUMATOID FACTOR: Rhuematoid fact SerPl-aCnc: <10 IU/mL (ref <=14)

## 2012-12-03 NOTE — Addendum Note (Signed)
Addended by: Almyra Deforest on: 12/03/2012 11:41 AM   Modules accepted: Orders

## 2012-12-04 LAB — TSH: TSH: 0.981 u[IU]/mL (ref 0.350–4.500)

## 2012-12-04 NOTE — Progress Notes (Signed)
Case discussed with Dr. Illath at time of visit, soon after the resident saw the patient.  We reviewed the resident's history and exam and pertinent patient test results.  I agree with the assessment, diagnosis, and plan of care documented in the resident's note. 

## 2012-12-08 ENCOUNTER — Encounter (HOSPITAL_COMMUNITY): Payer: Self-pay | Admitting: *Deleted

## 2012-12-08 ENCOUNTER — Emergency Department (HOSPITAL_COMMUNITY)
Admission: EM | Admit: 2012-12-08 | Discharge: 2012-12-09 | Disposition: A | Discharge status: Home / Self Care | Payer: No Typology Code available for payment source | Attending: Emergency Medicine | Admitting: Emergency Medicine

## 2012-12-08 DIAGNOSIS — B882 Other arthropod infestations: Secondary | ICD-10-CM | POA: Insufficient documentation

## 2012-12-08 DIAGNOSIS — Z87828 Personal history of other (healed) physical injury and trauma: Secondary | ICD-10-CM | POA: Insufficient documentation

## 2012-12-08 DIAGNOSIS — Z794 Long term (current) use of insulin: Secondary | ICD-10-CM | POA: Insufficient documentation

## 2012-12-08 DIAGNOSIS — M129 Arthropathy, unspecified: Secondary | ICD-10-CM | POA: Insufficient documentation

## 2012-12-08 DIAGNOSIS — S30860A Insect bite (nonvenomous) of lower back and pelvis, initial encounter: Secondary | ICD-10-CM | POA: Insufficient documentation

## 2012-12-08 DIAGNOSIS — K219 Gastro-esophageal reflux disease without esophagitis: Secondary | ICD-10-CM | POA: Insufficient documentation

## 2012-12-08 DIAGNOSIS — S1096XA Insect bite of unspecified part of neck, initial encounter: Secondary | ICD-10-CM | POA: Insufficient documentation

## 2012-12-08 DIAGNOSIS — E119 Type 2 diabetes mellitus without complications: Secondary | ICD-10-CM | POA: Insufficient documentation

## 2012-12-08 DIAGNOSIS — G4730 Sleep apnea, unspecified: Secondary | ICD-10-CM | POA: Insufficient documentation

## 2012-12-08 DIAGNOSIS — B888 Other specified infestations: Secondary | ICD-10-CM

## 2012-12-08 DIAGNOSIS — W57XXXA Bitten or stung by nonvenomous insect and other nonvenomous arthropods, initial encounter: Secondary | ICD-10-CM | POA: Insufficient documentation

## 2012-12-08 DIAGNOSIS — Y939 Activity, unspecified: Secondary | ICD-10-CM | POA: Insufficient documentation

## 2012-12-08 DIAGNOSIS — S90569A Insect bite (nonvenomous), unspecified ankle, initial encounter: Secondary | ICD-10-CM | POA: Insufficient documentation

## 2012-12-08 DIAGNOSIS — S40269A Insect bite (nonvenomous) of unspecified shoulder, initial encounter: Secondary | ICD-10-CM | POA: Insufficient documentation

## 2012-12-08 DIAGNOSIS — Y9229 Other specified public building as the place of occurrence of the external cause: Secondary | ICD-10-CM | POA: Insufficient documentation

## 2012-12-08 DIAGNOSIS — Z79899 Other long term (current) drug therapy: Secondary | ICD-10-CM | POA: Insufficient documentation

## 2012-12-08 DIAGNOSIS — Z8739 Personal history of other diseases of the musculoskeletal system and connective tissue: Secondary | ICD-10-CM | POA: Insufficient documentation

## 2012-12-08 MED ORDER — HYDROXYZINE HCL 25 MG PO TABS
25.0000 mg | ORAL_TABLET | Freq: Once | ORAL | Status: AC
  Administered 2012-12-08: 25 mg via ORAL
  Filled 2012-12-08: qty 1

## 2012-12-08 MED ORDER — HYDROXYZINE HCL 25 MG PO TABS: 25.0000 mg | ORAL_TABLET | Freq: Four times a day (QID) | ORAL | Status: DC

## 2012-12-08 MED ORDER — PERMETHRIN 5 % EX CREA: TOPICAL_CREAM | CUTANEOUS | Status: DC

## 2012-12-08 NOTE — ED Provider Notes (Signed)
History    This chart was scribed for Vinetta Bergamo, a non-physician practitioner working with No att. providers found by Lewanda Rife, ED Scribe. This patient was seen in room WTR9/WTR9 and the patient's care was started at 2325.     CSN: [id number]  Arrival date & time 12/08/12  2051   First MD Initiated Contact with Patient 12/08/12 2212      Chief Complaint  Patient presents with  . Insect Bite    (Consider location/radiation/quality/duration/timing/severity/associated sxs/prior treatment) The history is provided by the patient.   HPI Comments: [name] is a 52 y.o. female who presents to the Emergency Department complaining of bed bug bites onset yesterday from a hotel room. Reports severe constant pruritic rash on face, abdomen, arms, neck, and legs. Reports slightly scratchy throat. Denies shortness of breath, weakness, fever, chills, sweats, lower abdominal pain, and cough. Denies any aggravating or alleviating symptoms. Reports taking benadryl and hydrocortisone at home with no relief of symptoms.     Past Medical History  Diagnosis Date  . Adhesive capsulitis of right shoulder     with underlying tendinopathy rotator cuff  . History of post-sterilization tuboplasty 2000  . Tear of meniscus of left knee     x2  . Tear of meniscus of right knee   . Plantar fasciitis     Right  . Shortness of breath     with exertion  . Diabetes mellitus     oral tx  . GERD (gastroesophageal reflux disease)   . Arthritis   . Sleep apnea 5 plus yrs    study -pt could not sleep test inconclusive.     Past Surgical History  Procedure Laterality Date  . Cholecystectomy    . Meniscus repair      R and L (L x2)  . Tonsillectomy    . Knee arthroscopy  10/10/2011    Procedure: ARTHROSCOPY KNEE;  Surgeon: Nadara Mustard, MD;  Location: [facility] OR;  Service: Orthopedics;  Laterality: Right;  Right Knee Arthroscopy and Excision Medial Meniscus  . Tubal ligation       Family History  Problem Relation Age of Onset  . Cancer Sister     both sisters have breast cancer    History  Substance Use Topics  . Smoking status: Never Smoker   . Smokeless tobacco: Never Used  . Alcohol Use: No    OB History   Grav Para Term Preterm Abortions TAB SAB Ect Mult Living                  Review of Systems  Constitutional: Negative for fever and chills.  HENT: Negative for congestion, sore throat, trouble swallowing, neck pain and neck stiffness.   Eyes: Negative for pain and visual disturbance.  Respiratory: Negative for cough, chest tightness and shortness of breath.   Cardiovascular: Negative for chest pain.  Gastrointestinal: Negative for nausea, vomiting, abdominal pain, diarrhea and constipation.  Genitourinary: Negative for hematuria, decreased urine volume and difficulty urinating.  Skin: Positive for rash.  Neurological: Negative for dizziness, weakness, light-headedness, numbness and headaches.  All other systems reviewed and are negative.   A complete 10 system review of systems was obtained and all systems are negative except as noted in the HPI and PMH.    Allergies  Ofloxacin  Home Medications   Current Outpatient Rx  Name  Route  Sig  Dispense  Refill  . amitriptyline (ELAVIL) 25 MG tablet   Oral  Take 1 tablet (25 mg total) by mouth at bedtime.   30 tablet   0   . hydrOXYzine (ATARAX/VISTARIL) 25 MG tablet   Oral   Take 1 tablet (25 mg total) by mouth every 6 (six) hours.   12 tablet   0   . insulin glargine (LANTUS) 100 UNIT/ML injection      Inject 15 units at bedtime   10 mL      . omeprazole (PRILOSEC) 20 MG capsule   Oral   Take 1 capsule (20 mg total) by mouth daily.   30 capsule   1   . permethrin (ELIMITE) 5 % cream      Apply to neck down to feet, leave on for 6 hours, remove in shower, repeat within one week   6.5 g   0   . pravastatin (PRAVACHOL) 40 MG tablet   Oral   Take 1 tablet (40 mg  total) by mouth daily.   30 tablet   11   . traMADol (ULTRAM) 50 MG tablet   Oral   Take 1 tablet (50 mg total) by mouth every 8 (eight) hours as needed for pain.   180 tablet   0     BP 132/76  Pulse 73  Temp(Src) 98.2 F (36.8 C) (Oral)  Resp 16  Ht 5\' 2"  (1.575 m)  Wt 205 lb (92.987 kg)  BMI 37.49 kg/m2  SpO2 98%  LMP 11/28/2012  Physical Exam  Nursing note and vitals reviewed. Constitutional: She is oriented to person, place, and time. She appears well-developed and well-nourished. No distress.  HENT:  Head: Normocephalic and atraumatic.  Mouth/Throat: Oropharynx is clear and moist. No oropharyngeal exudate.  Uvula midline, symmetrica elevation  Eyes: EOM are normal.  Neck: Normal range of motion. Neck supple. No tracheal deviation present. No thyromegaly present.  Negative neck stiffness Negative nuchal rigidity Negative lymphadenopathy  Cardiovascular: Normal rate, regular rhythm and normal heart sounds.  Exam reveals no friction rub.   No murmur heard. Pulses:      Radial pulses are 2+ on the right side, and 2+ on the left side.  Pulmonary/Chest: Effort normal and breath sounds normal. No respiratory distress. She has no wheezes. She has no rales.  Musculoskeletal: Normal range of motion. She exhibits no edema and no tenderness.  Lymphadenopathy:    She has no cervical adenopathy.  Neurological: She is alert and oriented to person, place, and time. No cranial nerve deficit. She exhibits normal muscle tone. Coordination normal.  Cranial nerves III-XII grossly intact  Skin: Skin is warm and dry. Rash noted. Rash is maculopapular.  Erythematous maculopapular rash noted with erythematous base. Negative swelling, drainage. Noted multiple on face, neck, arms, lower abdomen, and legs. Negative warmth to touch.   Psychiatric: She has a normal mood and affect. Her behavior is normal.    ED Course  Procedures (including critical care time) Medications  hydrOXYzine  (ATARAX/VISTARIL) tablet 25 mg (25 mg Oral Given 12/08/12 2353)    Labs Reviewed - No data to display No results found.   1. Infestation by bed bug       MDM  I personally performed the services described in this documentation, which was scribed in my presence. The recorded information has been reviewed and is accurate.  Scattered bites to face, arms, abdomen, and legs. Patient reported that she was staying at a hotel that was infested with bed bugs. Patient reported that daughter has the same presentation. Reported that the  hotel supposedly cleaned and exterminated the room. Patient reported extreme pruritis.  Patient afebrile, normotensive, non-tachycardic, no tachypnea, adequate saturation on room air, alert and oriented. Presenting with scatter maculopapular lesions with erythematous base - look like bites. Suspected bed bugs infestation. Patient aseptic, non-toxic appearing, in no acute distress. Patient discharged. Discharged patient with permetherin cream, gave instructions to course, and atarax to aid in itch relief. Referred patient to PCP and dermatologist for follow-up. Discussed with patient the need to clean everything to prevent further infestation at the home. Discussed with patient to continue to rest and stay hydrated. Discussed with patient to avoid close contact. Discussed with patient to monitor symptoms and if symptoms are to worsen or change to report back to the ED. Patient agreed to plan of care, understood, all questions answered.   Raymon Mutton, PA-C 12/09/12 [phone]

## 2012-12-08 NOTE — ED Notes (Signed)
Pt states that she was in a hotel Sunday night and at around 2am she began to get bitten by insects; pt c/o bites to bilateral arms, face and chest; pt c/o itching to areas and a slight scratchy throat.

## 2012-12-09 NOTE — ED Notes (Signed)
rx x 2 for hydroxyzine and permethrin given

## 2012-12-11 ENCOUNTER — Encounter: Payer: Self-pay | Admitting: Obstetrics and Gynecology

## 2012-12-11 NOTE — ED Provider Notes (Signed)
Medical screening examination/treatment/procedure(s) were performed by non-physician practitioner and as supervising physician I was immediately available for consultation/collaboration.   Mesa Janus L Savahna Casados, MD 12/11/12 1521 

## 2012-12-12 ENCOUNTER — Ambulatory Visit (INDEPENDENT_AMBULATORY_CARE_PROVIDER_SITE_OTHER): Payer: No Typology Code available for payment source | Admitting: Internal Medicine

## 2012-12-12 ENCOUNTER — Encounter: Payer: Self-pay | Admitting: Internal Medicine

## 2012-12-12 ENCOUNTER — Ambulatory Visit: Payer: No Typology Code available for payment source | Admitting: Internal Medicine

## 2012-12-12 VITALS — BP 114/74 | HR 81 | Temp 97.9°F | Ht 62.0 in | Wt 204.1 lb

## 2012-12-12 DIAGNOSIS — M549 Dorsalgia, unspecified: Secondary | ICD-10-CM

## 2012-12-12 DIAGNOSIS — T148 Other injury of unspecified body region: Secondary | ICD-10-CM

## 2012-12-12 DIAGNOSIS — E119 Type 2 diabetes mellitus without complications: Secondary | ICD-10-CM

## 2012-12-12 DIAGNOSIS — W57XXXA Bitten or stung by nonvenomous insect and other nonvenomous arthropods, initial encounter: Secondary | ICD-10-CM

## 2012-12-12 LAB — GLUCOSE, CAPILLARY: Glucose-Capillary: 394 mg/dL — ABNORMAL HIGH (ref 70–99)

## 2012-12-12 MED ORDER — HYDROXYZINE HCL 25 MG PO TABS: 25.0000 mg | ORAL_TABLET | ORAL | Status: DC | PRN

## 2012-12-12 MED ORDER — HYDROCORTISONE 1 % EX CREA: TOPICAL_CREAM | CUTANEOUS | Status: DC

## 2012-12-12 MED ORDER — OXYCODONE-ACETAMINOPHEN 10-325 MG PO TABS: 1.0000 | ORAL_TABLET | Freq: Four times a day (QID) | ORAL | Status: DC | PRN

## 2012-12-12 NOTE — Progress Notes (Signed)
Subjective:   Patient ID: [name] female   DOB: [birth year] 52 y.o.   MRN: [id number]  HPI: [name] is a 52 y.o. woman pmh of DM, GERD presenting acutely for evaluation of bug bites/rash. Pt was at a hotel over the weekend and then visibly saw bed bugs during the night and in the AM when leaving. Pt then discovered several lesions from bites that were/are still very pruritic. Pt presented to UC center but was only given limited "anti-itch medicine" that helped provide relief. Pt has been trying OTC poison ivy cream with little success as well. Pt has not been in woods or had anyother bug bites, denied any fever/chills, systemic symptoms of nausea/vomiting/diarrhea/rash/joint pain above baseline.    Past Medical History  Diagnosis Date  . Adhesive capsulitis of right shoulder     with underlying tendinopathy rotator cuff  . History of post-sterilization tuboplasty 2000  . Tear of meniscus of left knee     x2  . Tear of meniscus of right knee   . Plantar fasciitis     Right  . Shortness of breath     with exertion  . Diabetes mellitus     oral tx  . GERD (gastroesophageal reflux disease)   . Arthritis   . Sleep apnea 5 plus yrs    study -pt could not sleep test inconclusive.    Current Outpatient Prescriptions  Medication Sig Dispense Refill  . amitriptyline (ELAVIL) 25 MG tablet Take 1 tablet (25 mg total) by mouth at bedtime.  30 tablet  0  . hydrOXYzine (ATARAX/VISTARIL) 25 MG tablet Take 1 tablet (25 mg total) by mouth every 6 (six) hours.  12 tablet  0  . insulin glargine (LANTUS) 100 UNIT/ML injection Inject 15 units at bedtime  10 mL    . omeprazole (PRILOSEC) 20 MG capsule Take 1 capsule (20 mg total) by mouth daily.  30 capsule  1  . permethrin (ELIMITE) 5 % cream Apply to neck down to feet, leave on for 6 hours, remove in shower, repeat within one week  6.5 g  0  . pravastatin (PRAVACHOL) 40 MG tablet Take 1 tablet (40 mg total) by mouth daily.  30  tablet  11  . traMADol (ULTRAM) 50 MG tablet Take 1 tablet (50 mg total) by mouth every 8 (eight) hours as needed for pain.  180 tablet  0   No current facility-administered medications for this visit.   Family History  Problem Relation Age of Onset  . Cancer Sister     both sisters have breast cancer   History   Social History  . Marital Status: Married    Spouse Name: N/A    Number of Children: N/A  . Years of Education: N/A   Social History Main Topics  . Smoking status: Never Smoker   . Smokeless tobacco: Never Used  . Alcohol Use: No  . Drug Use: No  . Sexually Active: None   Other Topics Concern  . None   Social History Narrative   Married, unemployed, Husband disabled and paraplegic 2/2 fall from tree stand while deer hunting, Son quadriplegic 2/2 MVA 05/2007 in [place]   Review of Systems: otherwise negative unless listed in HPI  Objective:  Physical Exam: Filed Vitals:   12/12/12 1513  BP: 114/74  Pulse: 81  Temp: 97.9 F (36.6 C)  TempSrc: Oral  Height: 5\' 2"  (1.575 m)  Weight: 204 lb 1.6 oz (92.579 kg)  SpO2: 97%  General: sitting in chair, NAD HEENT: PERRL, EOMI, no scleral icterus Cardiac: RRR, no rubs, murmurs or gallops Pulm: clear to auscultation bilaterally, moving normal volumes of air Ext: warm and well perfused, no pedal edema, several small 1mm raised erythematous circular lesions by mandible bilaterally, across forearms and wrists, some excoriations overlying lesions  Neuro: alert and oriented X3, cranial nerves II-XII grossly intact  Assessment & Plan:  1. Rash 2/2 bed bugs: patient stayed at a hotel with a visible bedbugs and then woke up with lesions around her wrists and neck and has continued to age. Patient originally presented to urgent care Center but has run out of antihistamine that was providing some relief. On physical exam today bites did not seem to be infected and patient shows no signs of systemic  infection. -hydroxyzine -hydrocortisone cream 1%  Chronic pain: pt was given as per pain contract with PCP -percocet   Pt was discussed with Dr. Eben Burow

## 2012-12-16 NOTE — Progress Notes (Signed)
Case discussed with Dr. Christen Bame at the time of the visit, immediately after the resident saw the patient.  I reviewed the resident's history and exam and pertinent patient test results.  I agree with the assessment, diagnosis and plan of care documented in the resident's note.

## 2013-01-02 ENCOUNTER — Ambulatory Visit: Payer: No Typology Code available for payment source | Admitting: Internal Medicine

## 2013-01-06 ENCOUNTER — Other Ambulatory Visit: Payer: Self-pay | Admitting: *Deleted

## 2013-01-06 DIAGNOSIS — M722 Plantar fascial fibromatosis: Secondary | ICD-10-CM

## 2013-01-06 DIAGNOSIS — M549 Dorsalgia, unspecified: Secondary | ICD-10-CM

## 2013-01-06 MED ORDER — TRAMADOL HCL 50 MG PO TABS: 50.0000 mg | ORAL_TABLET | Freq: Three times a day (TID) | ORAL | Status: DC | PRN

## 2013-01-06 MED ORDER — OXYCODONE-ACETAMINOPHEN 10-325 MG PO TABS: 1.0000 | ORAL_TABLET | Freq: Four times a day (QID) | ORAL | Status: DC | PRN

## 2013-01-06 NOTE — Telephone Encounter (Signed)
Tramadol was called into pharmacy  

## 2013-01-06 NOTE — Telephone Encounter (Signed)
[name]' pain contract specifies vicodin and tramadol.  However, at her most recent clinic visit with [name], she was changed to percocet for pain control.  She is on chronic narcotics for a variety of pain syndromes including fibromyalgia, chronic back pain and plantar fasciitis.  She will need to be assigned a new PCP as [name] is graduating and she will need an appointment within the next month for re-evaluation of her pain and new pain contract prior to percocet being chronically prescribed.  She no-showed to her last appointment with [name].    When [name] comes in to pick up percocet, this will need to be explained to her and an appointment arranged.    Thank you.

## 2013-01-06 NOTE — Telephone Encounter (Signed)
Pt aware of Dr Criselda Peaches note about getting appt within the month with new PCP and to sign new pain contract for Percocet.

## 2013-01-09 ENCOUNTER — Encounter: Payer: No Typology Code available for payment source | Admitting: Obstetrics and Gynecology

## 2013-01-14 ENCOUNTER — Ambulatory Visit (INDEPENDENT_AMBULATORY_CARE_PROVIDER_SITE_OTHER): Payer: No Typology Code available for payment source | Admitting: Internal Medicine

## 2013-01-14 ENCOUNTER — Encounter: Payer: Self-pay | Admitting: Internal Medicine

## 2013-01-14 ENCOUNTER — Ambulatory Visit: Payer: No Typology Code available for payment source | Admitting: Internal Medicine

## 2013-01-14 VITALS — BP 123/74 | HR 68 | Temp 96.9°F | Ht 60.75 in | Wt 198.0 lb

## 2013-01-14 DIAGNOSIS — M79609 Pain in unspecified limb: Secondary | ICD-10-CM

## 2013-01-14 DIAGNOSIS — M797 Fibromyalgia: Secondary | ICD-10-CM

## 2013-01-14 DIAGNOSIS — E119 Type 2 diabetes mellitus without complications: Secondary | ICD-10-CM

## 2013-01-14 DIAGNOSIS — IMO0001 Reserved for inherently not codable concepts without codable children: Secondary | ICD-10-CM

## 2013-01-14 DIAGNOSIS — M79642 Pain in left hand: Secondary | ICD-10-CM | POA: Insufficient documentation

## 2013-01-14 MED ORDER — METFORMIN HCL 500 MG PO TABS: 500.0000 mg | ORAL_TABLET | Freq: Two times a day (BID) | ORAL | Status: DC

## 2013-01-14 NOTE — Assessment & Plan Note (Signed)
Lab Results  Component Value Date   HGBA1C 10.0 12/02/2012   HGBA1C 8.7 06/23/2012   HGBA1C 10.3 04/07/2012     Assessment: Diabetes control: poor control (HgbA1C >9%) Progress toward A1C goal:  deteriorated Comments:   Plan: Medications:  add metformin Home glucose monitoring: Frequency:   Timing:   Instruction/counseling given: reminded to get eye exam, reminded to bring blood glucose meter & log to each visit and reminded to bring medications to each visit Educational resources provided: brochure;handout Self management tools provided:   Other plans:   Uncontrolled DM, on Insulin therapy.  She states that she was started with metformin 1000 BID in the past and unable to tolerate it due to diarrhea. I discussed with patient about initiation of metformin from the lowest dose and titrate it up only if she tolerates well.  Will start metformin 500 mg daily and self titrate to BID whenever she tolerate it. Patient agrees with the plan.

## 2013-01-14 NOTE — Progress Notes (Signed)
Subjective:   Patient ID: [name] female   DOB: Feb 28, 1961 52 y.o.   MRN: [id number]  Chief complaints: left hand pain  HPI: [name] is a 52 y.o. woman with PMH of DM, OSA, GERD, hs of B/L knee meniscus tears and right shoulder tendinopathy, who present to the clinic for follow up.   Patient reports left hand pain for past 3 weeks. She describes that her pain is gradual onset, throbbing/sharp, 5/10, no radiation, worse with any hand activity, and better with rest and tramadol. Associated symptoms include decreased left hand grip strength due to pain. Denies other associated symptoms. Denies numbness or tingling. Denies trauma or injury. She tried her home med Tramadol with some improvement.   Of not, she likes to work on [employer], and uses her hand a lot for repetitive activities. She is right handed. She has right hand Dupuytren's contracture.     Past Medical History  Diagnosis Date  . Adhesive capsulitis of right shoulder     with underlying tendinopathy rotator cuff  . History of post-sterilization tuboplasty 2000  . Tear of meniscus of left knee     x2  . Tear of meniscus of right knee   . Plantar fasciitis     Right  . Shortness of breath     with exertion  . Diabetes mellitus     oral tx  . GERD (gastroesophageal reflux disease)   . Arthritis   . Sleep apnea 5 plus yrs    study -pt could not sleep test inconclusive.    Current Outpatient Prescriptions  Medication Sig Dispense Refill  . amitriptyline (ELAVIL) 25 MG tablet Take 1 tablet (25 mg total) by mouth at bedtime.  30 tablet  0  . hydrocortisone cream 1 % Apply to affected area 2 times daily  30 g  1  . hydrOXYzine (ATARAX/VISTARIL) 25 MG tablet Take 1 tablet (25 mg total) by mouth every 4 (four) hours as needed for itching.  30 tablet  0  . insulin glargine (LANTUS) 100 UNIT/ML injection Inject 15 units at bedtime  10 mL    . omeprazole (PRILOSEC) 20 MG capsule Take 1 capsule (20 mg  total) by mouth daily.  30 capsule  1  . oxyCODONE-acetaminophen (PERCOCET) 10-325 MG per tablet Take 1 tablet by mouth every 6 (six) hours as needed for pain.  30 tablet  0  . permethrin (ELIMITE) 5 % cream Apply to neck down to feet, leave on for 6 hours, remove in shower, repeat within one week  6.5 g  0  . pravastatin (PRAVACHOL) 40 MG tablet Take 1 tablet (40 mg total) by mouth daily.  30 tablet  11  . traMADol (ULTRAM) 50 MG tablet Take 1 tablet (50 mg total) by mouth every 8 (eight) hours as needed for pain.  180 tablet  0   No current facility-administered medications for this visit.   Family History  Problem Relation Age of Onset  . Cancer Sister     both sisters have breast cancer   History   Social History  . Marital Status: Married    Spouse Name: N/A    Number of Children: N/A  . Years of Education: N/A   Social History Main Topics  . Smoking status: Never Smoker   . Smokeless tobacco: Never Used  . Alcohol Use: No  . Drug Use: No  . Sexually Active: None   Other Topics Concern  . None   Social History Narrative  Married, unemployed, Husband disabled and paraplegic 2/2 fall from tree stand while deer hunting, Son quadriplegic 2/2 MVA 05/2007 in [place]   Review of Systems: Review of Systems:  Constitutional:  Denies fever, chills, diaphoresis, appetite change and fatigue.   HEENT:  Denies congestion, sore throat, rhinorrhea, sneezing, mouth sores, trouble swallowing, neck pain   Respiratory:  Denies SOB, DOE, cough, and wheezing.   Cardiovascular:  Denies palpitations and leg swelling.   Gastrointestinal:  Denies nausea, vomiting, abdominal pain, diarrhea, constipation, blood in stool and abdominal distention.   Genitourinary:  Denies dysuria, urgency, frequency, hematuria, flank pain and difficulty urinating.   Musculoskeletal:  Denies myalgias, back pain, joint swelling, arthralgias and gait problem. Positive for left hand pain  Skin:  Denies pallor, rash  and wound.   Neurological:  Denies dizziness, seizures, syncope, weakness, light-headedness, numbness and headaches.     Objective:  Physical Exam: Filed Vitals:   01/14/13 0853  BP: 123/74  Pulse: 68  Temp: 96.9 F (36.1 C)  Height: 5' 0.75" (1.543 m)  Weight: 198 lb (89.812 kg)  SpO2: 98%   General: NAD Head: normocephalic and atraumatic.  Neck: supple, full ROM, no thyromegaly. Lungs: normal respiratory effort, no accessory muscle use, normal breath sounds, no crackles, and no wheezes. Heart: normal rate, regular rhythm, no murmur, no gallop, and no rub.  Abdomen: soft, non-tender, normal bowel sounds, no distention, no guarding, no rebound tenderness, no hepatomegaly, and no splenomegaly.  Msk: tenderness noted on over the flexor tendon just proximal to the flexor crease of the third and fourth fingers. ? A triangular puckering noted. No erythema, swelling, warmth to touch. No weakness, numbness or tingling. Tinel and Phalen test are negative.  Pulses: 2+ DP/PT pulses bilaterally Extremities: No cyanosis, clubbing, edema Neurologic: alert & oriented X3, cranial nerves II-XII intact, strength normal in all extremities, sensation intact to light touch, and gait normal.  Skin: turgor normal and no rashes.      Assessment & Plan:

## 2013-01-14 NOTE — Assessment & Plan Note (Signed)
Patient presents with left hand palm side 3rd and 4th MCP joint pain and tenderness to palpation. Questionable puckering over the painful area with passive fingers extension. No other associated symptoms. Denies trauma or injury. The clinical manifestation suggests early stage of Dupuytren's contracture vs tenosynovitis. DDX include diabetic cheiroarthropathy ( limited joint mobility), palmer fibromatosis ( palmar fasciitis), camptodactyly, or intrinsic joint disease.    Limited joint mobility in DM pt involves all four fingers, and Palmar fibromatosis causes flexion contractures of all fingers. whereas Dupuytren's contracture more commonly affects ulnar sided fingers with 1st and 2nd fingers typically spared.    - will send sports medicine referral for further evaluation.  - if confirmed, the treatment involves intralesional injections and even surgery.

## 2013-01-14 NOTE — Patient Instructions (Addendum)
1. Will see you in one month 2. Will start you back on Metformin, please self titrate to 500 mg po twice daily 3. Will send sports medicine referral.  General Instructions:    Treatment Goals:  Goals (1 Years of Data) as of 01/14/13         12/02/12 06/23/12 05/23/12 04/07/12 08/30/11     Result Component    . HEMOGLOBIN A1C < 6.5  10.0 8.7  10.3 8.6    . LDL CALC < 100    133  113    . LDL CALC < 100    133  113      Progress Toward Treatment Goals:  Treatment Goal 01/14/2013  Hemoglobin A1C deteriorated    Self Care Goals & Plans:  Self Care Goal 01/14/2013  Manage my medications take my medicines as prescribed; bring my medications to every visit; refill my medications on time; follow the sick day instructions if I am sick  Monitor my health keep track of my blood glucose; check my feet daily  Eat healthy foods eat more vegetables; eat fruit for snacks and desserts; eat baked foods instead of fried foods; eat smaller portions; drink diet soda or water instead of juice or soda  Be physically active take a walk every day; find an activity I enjoy    Home Blood Glucose Monitoring 09/03/2012  Check my blood sugar 3 times a day  When to check my blood sugar before meals     Care Management & Community Referrals:  Referral 01/14/2013  Referrals made for care management support diabetes educator

## 2013-01-15 LAB — GLUCOSE, CAPILLARY: Glucose-Capillary: 284 mg/dL — ABNORMAL HIGH (ref 70–99)

## 2013-01-15 NOTE — Progress Notes (Signed)
Case discussed with Dr. Li soon after the resident saw the patient. We reviewed the resident's history and exam and pertinent patient test results. I agree with the assessment, diagnosis, and plan of care documented in the resident's note. 

## 2013-01-23 ENCOUNTER — Telehealth: Payer: Self-pay | Admitting: *Deleted

## 2013-01-23 NOTE — Telephone Encounter (Signed)
Pt called - had Tramadol filled Rx was for #180 - but only #90 were filled do to the directions - one every 8 hours as needed. Pt states she was told to do two tab three times per day. Last Rx was 01/06/13 [facility] for #90 - not 180. Needs new Rx and new directions to [facility] pharmacy. Stanton Kidney Dashan Chizmar RN 01/23/13 2PM

## 2013-01-24 NOTE — Telephone Encounter (Signed)
Based on pain contract signed with her previous PCP, she is on Tramadol 50 every 8 hours as needed, # 90. I do not see any document indicated otherwise. I will only prescribe it as what the pain contract indicates.  She can schedule an appt to re eval her pain.  Dr. Dierdre Searles.

## 2013-01-28 ENCOUNTER — Other Ambulatory Visit: Payer: Self-pay | Admitting: *Deleted

## 2013-01-28 DIAGNOSIS — M722 Plantar fascial fibromatosis: Secondary | ICD-10-CM

## 2013-01-28 MED ORDER — TRAMADOL HCL 50 MG PO TABS: 100.0000 mg | ORAL_TABLET | Freq: Three times a day (TID) | ORAL | Status: DC | PRN

## 2013-01-29 NOTE — Telephone Encounter (Signed)
Written Rx given to pt while in clinic 01/28/13 PM with son.

## 2013-01-29 NOTE — Telephone Encounter (Signed)
Talked with Dr Criselda Peaches about Rx.

## 2013-02-06 ENCOUNTER — Ambulatory Visit (INDEPENDENT_AMBULATORY_CARE_PROVIDER_SITE_OTHER): Payer: No Typology Code available for payment source | Admitting: Family Medicine

## 2013-02-06 VITALS — BP 128/70 | Ht 61.0 in | Wt 198.0 lb

## 2013-02-06 DIAGNOSIS — M720 Palmar fascial fibromatosis [Dupuytren]: Secondary | ICD-10-CM

## 2013-02-06 DIAGNOSIS — M79642 Pain in left hand: Secondary | ICD-10-CM

## 2013-02-06 DIAGNOSIS — M79609 Pain in unspecified limb: Secondary | ICD-10-CM

## 2013-02-06 MED ORDER — COLCHICINE 0.6 MG PO TABS: 0.6000 mg | ORAL_TABLET | Freq: Two times a day (BID) | ORAL | Status: DC

## 2013-02-06 NOTE — Progress Notes (Signed)
  Subjective:    Patient ID: [name], female    DOB: 1961-07-16, 52 y.o.   MRN: [id number]  HPI  Left hand weakness particularly in the third fourth and fifth digits. This started about 6 weeks ago abruptly. She woke up one morning and relies she was having trouble gripping things. It has gotten some worse since then. She has no numbness or tingling. Has seen no color change in the hand. She's had no specific injury. The weakness is mostly in the fingers although the thumb is somewhat weak as well. She has a little bit of pain in the palm that has occurred over the last week or so. Initially it was just weakness.   Pertinent past medical history: Plantar fibromatosis. Diabetes mellitus: Review of Systems Denies fever, sweats, chills. Has noted no rash. Has had no specific new migrating arthralgias.    Objective:   Physical Exam  Vital signs are reviewed GENERAL: Well-developed overweight female no acute distress WRISTS: Bilaterally she has full wrist extension and flexion and strength is 5 out of 5. HAND: Right hand has normal finger and palm her strength and grip. Left hand she has normal finger abduction and abduction but has virtually no flexion of the third fourth and fifth finger. She has limited flexion of the index finger. Her pincer grip between thumb and index finger is week. ULTRASOUND: The tendons of the flexor digitorum profundus appear to have quite a bit of fluid around them and the tendon seems somewhat enlarged.      Assessment & Plan:  #1. Hand weakness which I think as a result of some type of rheumatological issue with her left flexor digitorum profundus. I noticed that somebody had recently started a rheumatological workup on her that was so far negative. Given her lack of insurance we are quite limited in what we can do. I want to start her on colchicine twice a day as an anti-inflammatory but she's unable to get that secondary to cost. We'll start her on  prednisone 20 mg daily. She has poorly controlled diabetes so we will only do this for 5 days and now see her back. Her sensation seems intact and she has no atrophy. I wonder if this is somehow related to the fairly significant plantar fascia fibromatosis.

## 2013-02-12 ENCOUNTER — Encounter: Payer: No Typology Code available for payment source | Admitting: Obstetrics and Gynecology

## 2013-02-13 ENCOUNTER — Ambulatory Visit (INDEPENDENT_AMBULATORY_CARE_PROVIDER_SITE_OTHER): Payer: No Typology Code available for payment source | Admitting: Family Medicine

## 2013-02-13 ENCOUNTER — Telehealth: Payer: Self-pay | Admitting: *Deleted

## 2013-02-13 VITALS — BP 117/77 | Ht 61.0 in | Wt 201.0 lb

## 2013-02-13 DIAGNOSIS — M79609 Pain in unspecified limb: Secondary | ICD-10-CM

## 2013-02-13 DIAGNOSIS — R29898 Other symptoms and signs involving the musculoskeletal system: Secondary | ICD-10-CM

## 2013-02-13 DIAGNOSIS — M79642 Pain in left hand: Secondary | ICD-10-CM

## 2013-02-13 DIAGNOSIS — M6281 Muscle weakness (generalized): Secondary | ICD-10-CM

## 2013-02-13 DIAGNOSIS — E119 Type 2 diabetes mellitus without complications: Secondary | ICD-10-CM

## 2013-02-13 MED ORDER — PREDNISONE 20 MG PO TABS: 20.0000 mg | ORAL_TABLET | Freq: Every day | ORAL | Status: DC

## 2013-02-13 NOTE — Telephone Encounter (Signed)
Pt called - wants to go back on Norco 10mg  - uses [facility]. States had hallucination with Percocet. Will be out of med this weekend. Stanton Kidney Whisper Kurka RN 02/13/13 10:15AM

## 2013-02-13 NOTE — Progress Notes (Signed)
CC: F/u Left hand pain and weakness  HPI: Patient presents for f/u of left 3rd, 4th, and 5th finger pain and weakness. Patient was seen last week with this problem that had started suddenly 6 weeks prior. She woke up one morning with pain and weakness. She is unable to grip and cannot close her hand fully.  CC: Followup left hand pain and weakness HPI: Patient is a very pleasant 52 year old female with past medical history of plantar fibromatosis who presents for followup of left hand pain and weakness. She was seen one week ago by Dr. Jennette Kettle stating that she had woken up suddenly one morning with pain and inability to move her left third, fourth, and fifth digits. She could hardly flex those fingers. She also complained of severe swelling and pain. Denied to be injury, trauma, or trigger for the symptoms.  Today, patient states that she continues to have pain and weakness however it is improved. She took 5 days of prednisone and noted that this seemed to help both her range of motion, pain, and strength. However, she is no longer on this medication and she feels like the swelling is returning. She does note that she has significantly improved flexion of those digits.  ROS: As above in the HPI. All other systems are stable or negative.  OBJECTIVE: APPEARANCE:  Patient in no acute distress.The patient appeared well nourished and normally developed. HEENT: No scleral icterus. Conjunctiva non-injected Resp: Non labored Skin: No rash MSK:  Left hand: There is puffiness of the left hand. She has decreased range of motion in the 4 fingers with loss of full flexion. She is unable to get a tight grip. There is tenderness to palpation just proximal to the third and fourth MCP joint. Capillary refill is normal. Sensation intact distally. There is a mild Dupuytren's contracture  MSK [name]: Ultrasound of the left hand was repeated and transverse and longitudinal views. This shows apparent intact flexor  digitorum profundus and superficialis. These tendons appear thickened bilaterally. There is no evidence of hypoechoic fluid collection to suggest swelling or inflammation. Joint lines of PIP and MCP of the third digit visualized and are without increased hypoechoic fluid to suggest joint swelling.   ASSESSMENT: Left hand pain and weakness with loss of range of motion. Now improved after oral corticosteroids.   PLAN: Unclear etiology of the patient's symptoms, however, what appeared to be inflammatory based on patient's significant improvement not only in pain but also range of motion and swelling with short course of corticosteroids. At this point we'll obtain an MRI of the hand for additional information. We'll place her back on a two-week course of oral prednisone. She will followup in 2 weeks.

## 2013-02-13 NOTE — Patient Instructions (Signed)
Thank you for coming in today We would like for you to restart prednisone 1 pill daily for 2 weeks We are ordering an MRI study Followup after MRI

## 2013-02-15 MED ORDER — HYDROCODONE-ACETAMINOPHEN 10-325 MG PO TABS: 1.0000 | ORAL_TABLET | Freq: Four times a day (QID) | ORAL | Status: DC | PRN

## 2013-02-16 NOTE — Telephone Encounter (Signed)
Rx called in to pharmacy - pt aware. Informed she needs appt to discuss pain management - decided not to make at this time. Was going to talk with Tyler Aas about PCP.

## 2013-02-18 ENCOUNTER — Ambulatory Visit
Admission: RE | Admit: 2013-02-18 | Discharge: 2013-02-18 | Disposition: A | Discharge status: Home / Self Care | Payer: No Typology Code available for payment source | Source: Ambulatory Visit | Attending: Family Medicine | Admitting: Family Medicine

## 2013-02-18 ENCOUNTER — Ambulatory Visit: Payer: No Typology Code available for payment source

## 2013-02-18 ENCOUNTER — Other Ambulatory Visit (INDEPENDENT_AMBULATORY_CARE_PROVIDER_SITE_OTHER): Payer: No Typology Code available for payment source

## 2013-02-18 DIAGNOSIS — E119 Type 2 diabetes mellitus without complications: Secondary | ICD-10-CM

## 2013-02-18 LAB — BASIC METABOLIC PANEL WITH GFR
BUN: 11 mg/dL (ref 6–23)
CO2: 26 mEq/L (ref 19–32)
Calcium: 9.3 mg/dL (ref 8.4–10.5)
Chloride: 98 mEq/L (ref 96–112)
Creat: 0.56 mg/dL (ref 0.50–1.10)
GFR, Est African American: >89 mL/min
GFR, Est Non African American: >89 mL/min
Glucose, Bld: 301 mg/dL — ABNORMAL HIGH (ref 70–99)
Potassium: 3.8 mEq/L (ref 3.5–5.3)
Sodium: 135 mEq/L (ref 135–145)

## 2013-02-23 ENCOUNTER — Other Ambulatory Visit: Payer: No Typology Code available for payment source

## 2013-02-26 ENCOUNTER — Encounter: Payer: No Typology Code available for payment source | Admitting: Internal Medicine

## 2013-02-26 ENCOUNTER — Ambulatory Visit
Admission: RE | Admit: 2013-02-26 | Discharge: 2013-02-26 | Disposition: A | Discharge status: Home / Self Care | Payer: No Typology Code available for payment source | Source: Ambulatory Visit | Attending: Family Medicine | Admitting: Family Medicine

## 2013-02-26 ENCOUNTER — Encounter: Payer: Self-pay | Admitting: Internal Medicine

## 2013-02-26 ENCOUNTER — Ambulatory Visit (INDEPENDENT_AMBULATORY_CARE_PROVIDER_SITE_OTHER): Payer: No Typology Code available for payment source | Admitting: Internal Medicine

## 2013-02-26 VITALS — BP 105/72 | HR 69 | Temp 97.0°F | Wt 200.3 lb

## 2013-02-26 DIAGNOSIS — M79642 Pain in left hand: Secondary | ICD-10-CM

## 2013-02-26 DIAGNOSIS — M79609 Pain in unspecified limb: Secondary | ICD-10-CM

## 2013-02-26 DIAGNOSIS — M722 Plantar fascial fibromatosis: Secondary | ICD-10-CM

## 2013-02-26 DIAGNOSIS — E119 Type 2 diabetes mellitus without complications: Secondary | ICD-10-CM

## 2013-02-26 LAB — POCT GLYCOSYLATED HEMOGLOBIN (HGB A1C): Hemoglobin A1C: 10.9

## 2013-02-26 LAB — GLUCOSE, CAPILLARY: Glucose-Capillary: 500 mg/dL — ABNORMAL HIGH (ref 70–99)

## 2013-02-26 MED ORDER — TRAMADOL HCL 50 MG PO TABS: 100.0000 mg | ORAL_TABLET | Freq: Three times a day (TID) | ORAL | Status: DC | PRN

## 2013-02-26 MED ORDER — GADOBENATE DIMEGLUMINE 529 MG/ML IV SOLN
18.0000 mL | Freq: Once | INTRAVENOUS | Status: AC | PRN
  Administered 2013-02-26: 18 mL via INTRAVENOUS

## 2013-02-26 NOTE — Patient Instructions (Addendum)
1. Check your BMP today 2. Follow up in one month  General Instructions:    Treatment Goals:  Goals (1 Years of Data) as of 02/26/13         As of Today 12/02/12 06/23/12 05/23/12 04/07/12     Result Component    . HEMOGLOBIN A1C < 6.5  10.9 10.0 8.7  10.3    . LDL CALC < 100     133     . LDL CALC < 100     133       Progress Toward Treatment Goals:  Treatment Goal 02/26/2013  Hemoglobin A1C deteriorated    Self Care Goals & Plans:  Self Care Goal 02/26/2013  Manage my medications take my medicines as prescribed  Monitor my health keep track of my blood glucose  Eat healthy foods -  Be physically active find an activity I enjoy    Home Blood Glucose Monitoring 09/03/2012  Check my blood sugar 3 times a day  When to check my blood sugar before meals     Care Management & Community Referrals:  Referral 02/26/2013  Referrals made for care management support diabetes educator

## 2013-02-26 NOTE — Progress Notes (Signed)
Subjective:   Patient ID: [name] female   DOB: 04/09/1961 52 y.o.   MRN: [id number]  Chief complaints: left hand pain  HPI: [name] is a 52 y.o. woman with PMH of DM, OSA, GERD, hs of B/L knee meniscus tears and right shoulder tendinopathy, who present to the clinic for follow up.   # uncontrolled DM, HGB A1C 10.9 today.     Patient reports thirsty, polydipsia and polyuria. Denies blurry vision or numbness/tingling on hands.     Patient states that she has not been taking her insulin or Metformin consistently. She checks her CBG once daily and reports CBGs of 190's. She has a lot of life stressors. Her husband is paraplegia and her son is quadriplegia. 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 She states that she forgets to take her insulin or metformin due to her busy schedule. She admits dietary discretion. She states that she just ate cake and sandwich and regular lemonade a couple of hours before she arrives at the clinic. Her CBG is 500 now.     She states that she feel fine otherwise.     Past Medical History  Diagnosis Date  . Adhesive capsulitis of right shoulder     with underlying tendinopathy rotator cuff  . History of post-sterilization tuboplasty 2000  . Tear of meniscus of left knee     x2  . Tear of meniscus of right knee   . Plantar fasciitis     Right  . Shortness of breath     with exertion  . Diabetes mellitus     oral tx  . GERD (gastroesophageal reflux disease)   . Arthritis   . Sleep apnea 5 plus yrs    study -pt could not sleep test inconclusive.    Current Outpatient Prescriptions  Medication Sig Dispense Refill  . colchicine 0.6 MG tablet Take 1 tablet (0.6 mg total) by mouth 2 (two) times daily.  60 tablet  0  . HYDROcodone-acetaminophen (NORCO) 10-325 MG per tablet Take 1 tablet by mouth every 6 (six) hours as needed for pain.  30 tablet  0  . omeprazole (PRILOSEC) 20 MG capsule Take 1 capsule (20 mg total)  by mouth daily.  30 capsule  1  . pravastatin (PRAVACHOL) 40 MG tablet Take 1 tablet (40 mg total) by mouth daily.  30 tablet  11  . predniSONE (DELTASONE) 20 MG tablet Take 1 tablet (20 mg total) by mouth daily.  14 tablet  0  . traMADol (ULTRAM) 50 MG tablet Take 2 tablets (100 mg total) by mouth every 8 (eight) hours as needed for pain.  180 tablet  0  . insulin glargine (LANTUS) 100 UNIT/ML injection Inject 15 units at bedtime  10 mL    . metFORMIN (GLUCOPHAGE) 500 MG tablet Take 1 tablet (500 mg total) by mouth 2 (two) times daily with a meal.  60 tablet  11   No current facility-administered medications for this visit.   Family History  Problem Relation Age of Onset  . Cancer Sister     both sisters have breast cancer   History   Social History  . Marital Status: Married    Spouse Name: N/A    Number of Children: N/A  . Years of Education: N/A   Social History Main Topics  . Smoking status: Never Smoker   . Smokeless tobacco: Never Used  . Alcohol Use: No  . Drug Use: No  .  Sexual Activity: None   Other Topics Concern  . None   Social History Narrative   Married, unemployed, Husband disabled and paraplegic 2/2 fall from tree stand while deer hunting, Son quadriplegic 2/2 MVA 05/2007 in [place]   Review of Systems: Review of Systems:  Constitutional:  Denies fever, chills, diaphoresis, appetite change and fatigue. Positive for thirst, polydipsia and polyuria.   HEENT:  Denies congestion, sore throat, rhinorrhea, sneezing, mouth sores, trouble swallowing, neck pain   Respiratory:  Denies SOB, DOE, cough, and wheezing.   Cardiovascular:  Denies palpitations and leg swelling.   Gastrointestinal:  Denies nausea, vomiting, abdominal pain, diarrhea, constipation, blood in stool and abdominal distention.   Genitourinary:  Denies dysuria, urgency, frequency, hematuria, flank pain and difficulty urinating.   Musculoskeletal:  Denies myalgias, back pain, joint swelling,  arthralgias and gait problem.   Skin:  Denies pallor, rash and wound.   Neurological:  Denies dizziness, seizures, syncope, weakness, light-headedness, numbness and headaches.     Objective:  Physical Exam: Filed Vitals:   02/26/13 1551  BP: 105/72  Pulse: 69  Temp: 97 F (36.1 C)  Weight: 200 lb 4.8 oz (90.855 kg)  SpO2: 97%   General: NAD Head: normocephalic and atraumatic.  Neck: supple, full ROM, no thyromegaly. Lungs: normal respiratory effort, no accessory muscle use, normal breath sounds, no crackles, and no wheezes. Heart: normal rate, regular rhythm, no murmur, no gallop, and no rub.  Abdomen: soft, non-tender, normal bowel sounds, no distention, no guarding, no rebound tenderness. MSK: chronic hands pain followed up by her sports medicine.  Pulses: 2+ DP/PT pulses bilaterally Extremities: No cyanosis, clubbing, edema Neurologic: alert & oriented X3, cranial nerves II-XII intact, strength normal in all extremities, sensation intact to light touch, and gait normal.     Assessment & Plan:

## 2013-02-26 NOTE — Assessment & Plan Note (Addendum)
Lab Results  Component Value Date   HGBA1C 10.9 02/26/2013   HGBA1C 10.0 12/02/2012   HGBA1C 8.7 06/23/2012     Assessment: Diabetes control: poor control (HgbA1C >9%) Progress toward A1C goal:  deteriorated Comments: uncontrolled DM  Plan: Medications:  continue current medications Home glucose monitoring: Frequency:   Timing:   Instruction/counseling given: reminded to get eye exam, reminded to bring blood glucose meter & log to each visit, reminded to bring medications to each visit, discussed foot care, discussed the need for weight loss and discussed diet Educational resources provided: brochure Self management tools provided:   Other plans:   Patient has uncontrolled type II DM 2/2 medical noncompliance. She admits dietary discretion as well.  She is the only caregiver for her paraplegia husband and quadriplegia son.  She states that her life is very stressful and she forgets to take her medications at times.  She is in a very difficult situation now and does not have time to take care of herself.  It will be unsafe for [name] to titrate her insulin since she is noncompliant and has dietary discretion.  Her unfortunate social issue further complicates her DM management.   Plan - Social worker consult - close follow up with DM educator Lupita Leash - close follow up with me

## 2013-02-27 ENCOUNTER — Ambulatory Visit (INDEPENDENT_AMBULATORY_CARE_PROVIDER_SITE_OTHER): Payer: No Typology Code available for payment source | Admitting: Family Medicine

## 2013-02-27 VITALS — BP 121/79 | Ht 61.0 in | Wt 200.0 lb

## 2013-02-27 DIAGNOSIS — M659 Synovitis and tenosynovitis, unspecified: Secondary | ICD-10-CM

## 2013-02-27 DIAGNOSIS — M79609 Pain in unspecified limb: Secondary | ICD-10-CM

## 2013-02-27 DIAGNOSIS — M65949 Unspecified synovitis and tenosynovitis, unspecified hand: Secondary | ICD-10-CM

## 2013-02-27 DIAGNOSIS — M65839 Other synovitis and tenosynovitis, unspecified forearm: Secondary | ICD-10-CM

## 2013-02-27 DIAGNOSIS — M79642 Pain in left hand: Secondary | ICD-10-CM

## 2013-02-27 DIAGNOSIS — M65849 Other synovitis and tenosynovitis, unspecified hand: Secondary | ICD-10-CM

## 2013-02-27 LAB — BASIC METABOLIC PANEL
BUN: 11 mg/dL (ref 6–23)
CO2: 22 mEq/L (ref 19–32)
Calcium: 9.5 mg/dL (ref 8.4–10.5)
Chloride: 102 mEq/L (ref 96–112)
Creat: 0.77 mg/dL (ref 0.50–1.10)
Glucose, Bld: 462 mg/dL — ABNORMAL HIGH (ref 70–99)
Potassium: 4.5 mEq/L (ref 3.5–5.3)
Sodium: 135 mEq/L (ref 135–145)

## 2013-02-27 MED ORDER — PREDNISONE 20 MG PO TABS: 20.0000 mg | ORAL_TABLET | Freq: Every day | ORAL | Status: DC

## 2013-02-27 NOTE — Progress Notes (Signed)
CC: Followup left hand pain HPI: Patient is a very pleasant 52 year old female who presents for followup of left hand pain. She states that she finished her last prednisone pill and a 2 week course today. She is very concerned because she is super sensitive to stopping the prednisone. One day she forgot to take it in the morning and did not take it until 2:00 in the afternoon and noticed that her hand already got stiff and swollen. She states that she feels great when she is on the prednisone and although she does not have full strength she has great improvement. She did have an MRI of the hand that showed flexor tendinitis with edema around the flexor tendons of the third and fourth digits.  ROS: As above in the HPI. All other systems are stable or negative.  OBJECTIVE: APPEARANCE:  Patient in no acute distress.The patient appeared well nourished and normally developed. HEENT: No scleral icterus. Conjunctiva non-injected Resp: Non labored Skin: No rash MSK:  Hand exam: There is no swelling or deformity of the left hand. Patient has movement of all 4 fingers. She is able to close her fingers into a full grip for the first time since I have seen her. She is tender to palpation over the third and fourth metacarpals over the palmar aspect of the hand where the flexor tendons lie. There also palpable nodules which have been chronic for her. Grip strength is 4/5.   ASSESSMENT: #1. Left hand pain with loss of range of motion and grip strength associated with swelling. Good response to oral prednisone is suggestive of inflammatory process. MRI shows significant swelling around the third and fourth flexor tendons in the palm of the hand suggestive of flexor tendinitis.   PLAN: Given that patient has persistent swelling around the tendons despite being on prednisone we think that it is too early to discontinue this medication. This is especially true given that she had worsening of her symptoms with a  delay of taking the medication of only hours. We do it knowledge that the prednisone raises her blood sugars but think it would be detrimental to stop the prednisone at this point this patient will lose function of her left hand. We'll continue the prednisone for another 2 weeks and reevaluate at that time.

## 2013-02-27 NOTE — Patient Instructions (Signed)
Thank you for coming in today Please continue the prednisone for 2 more weeks. Followup in 2 weeks. Your MRI shows swelling around the flexor tendons to your middle and 4th fingers.

## 2013-02-28 NOTE — Progress Notes (Signed)
Case discussed with Dr. Li soon after the resident saw the patient. We reviewed the resident's history and exam and pertinent patient test results. I agree with the assessment, diagnosis, and plan of care documented in the resident's note. 

## 2013-03-03 NOTE — Progress Notes (Signed)
[facility]: Attending Note: I have reviewed the chart, discussed wit the Sports Medicine Fellow. I agree with assessment and treatment plan as detailed in the Fellow's note. Her last she is diabetic with poor control but I think the risk benefit ratio certainly beings to work continue treatment and we have for hand function back. The MRIs quite impressive with the amount of fluid around the tendon sheaths of the ring and long finger particularly.

## 2013-03-12 ENCOUNTER — Encounter: Payer: No Typology Code available for payment source | Admitting: Obstetrics & Gynecology

## 2013-03-13 ENCOUNTER — Ambulatory Visit (INDEPENDENT_AMBULATORY_CARE_PROVIDER_SITE_OTHER): Payer: No Typology Code available for payment source | Admitting: Family Medicine

## 2013-03-13 VITALS — BP 119/80 | Ht 61.0 in | Wt 200.0 lb

## 2013-03-13 DIAGNOSIS — M79609 Pain in unspecified limb: Secondary | ICD-10-CM

## 2013-03-13 DIAGNOSIS — M65839 Other synovitis and tenosynovitis, unspecified forearm: Secondary | ICD-10-CM

## 2013-03-13 DIAGNOSIS — IMO0002 Reserved for concepts with insufficient information to code with codable children: Secondary | ICD-10-CM | POA: Insufficient documentation

## 2013-03-13 DIAGNOSIS — M659 Synovitis and tenosynovitis, unspecified: Secondary | ICD-10-CM

## 2013-03-13 DIAGNOSIS — M79642 Pain in left hand: Secondary | ICD-10-CM

## 2013-03-13 MED ORDER — PREDNISONE 20 MG PO TABS: ORAL_TABLET | ORAL | Status: DC

## 2013-03-13 NOTE — Patient Instructions (Signed)
Restart prednisone at 20 mg a day. Do this for 5 days. Then try to decrease to 10 mg a day. If her symptoms are still pretty much okay stay at the 10 mg dose until I see her back on September 12 for some time that week. If after decreasing to 10 mg she started to have worsening symptoms that are significantly worse, give [name] a call and we'll probably go back up. I would like to get her steroid dose as low as possible because I think are going to have to be on this for a while.

## 2013-03-13 NOTE — Progress Notes (Signed)
  Subjective:    Patient ID: [name], female    DOB: 28-Feb-1961, 52 y.o.   MRN: [id number]  HPI F/u left hand flexor tenosynovitis Was significantly improved until 24 hours after stopping steroid, then had al,mpst instant return of symptoms of stiffness and pain with grasping. Stopped steroids day before yesterday.   Review of Systems No fever sweats or chills.    Objective:   Physical Exam  Vital signs reviewed. GENERAL: Well developed, well nourished, no acute distress LEFT HAND. Swollen fingers,with painful flxion but more or less intact grip strength.       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 I know of nothing different to do other than continue steroids for indefinite period. MRi shoed sig fluid collection and correlated with [name]. Had severe sx initially and has improved significanly on steroids--I would like to get her down to at least as low a dose as possible (see pt instructions) so will taper to 10 mg daily and see her back. Intent is to continue taper to lowest effective dose and will ultimately hope to stop them She is using SSI.

## 2013-03-18 ENCOUNTER — Other Ambulatory Visit: Payer: Self-pay | Admitting: *Deleted

## 2013-03-18 NOTE — Addendum Note (Signed)
Addended by: Neomia Dear on: 03/18/2013 07:43 PM   Modules accepted: Orders

## 2013-03-19 MED ORDER — HYDROCODONE-ACETAMINOPHEN 10-325 MG PO TABS: 1.0000 | ORAL_TABLET | Freq: Four times a day (QID) | ORAL | Status: DC | PRN

## 2013-03-19 NOTE — Telephone Encounter (Signed)
Called to pharm 

## 2013-03-26 ENCOUNTER — Ambulatory Visit (INDEPENDENT_AMBULATORY_CARE_PROVIDER_SITE_OTHER): Payer: No Typology Code available for payment source | Admitting: Internal Medicine

## 2013-03-26 ENCOUNTER — Encounter: Payer: Self-pay | Admitting: Internal Medicine

## 2013-03-26 VITALS — BP 106/74 | HR 76 | Temp 97.4°F | Ht 61.0 in | Wt 203.1 lb

## 2013-03-26 DIAGNOSIS — Z23 Encounter for immunization: Secondary | ICD-10-CM

## 2013-03-26 DIAGNOSIS — M659 Synovitis and tenosynovitis, unspecified: Secondary | ICD-10-CM

## 2013-03-26 DIAGNOSIS — M65839 Other synovitis and tenosynovitis, unspecified forearm: Secondary | ICD-10-CM

## 2013-03-26 DIAGNOSIS — IMO0001 Reserved for inherently not codable concepts without codable children: Secondary | ICD-10-CM

## 2013-03-26 DIAGNOSIS — Z0000 Encounter for general adult medical examination without abnormal findings: Secondary | ICD-10-CM

## 2013-03-26 LAB — GLUCOSE, CAPILLARY
Glucose-Capillary: 116 mg/dL — ABNORMAL HIGH (ref 70–99)
Glucose-Capillary: 65 mg/dL — ABNORMAL LOW (ref 70–99)

## 2013-03-26 NOTE — Patient Instructions (Addendum)
1. Follow up in one month 2. Will send referral for mammogram and colonoscopy

## 2013-03-26 NOTE — Progress Notes (Signed)
Subjective:   Patient ID: [name] female   DOB: 1960/08/24 52 y.o.   MRN: [id number]  Chief complaints: left hand pain  HPI: [name] is a 52 y.o. woman with PMH of uncontrolled DM, OSA, GERD, hx of B/L knee meniscus tears and right shoulder tendinopathy, who present to the clinic for follow up.  She has a lot of life stressors. Her husband is paraplegia and her son is quadriplegia. 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 She states that she forgets to take her insulin or metformin due to her busy schedule. She admits dietary discretion.   # Uncontrolled DM, HGB A1C 10.9 on 02/26/13    Patient reports feeling fine and denies thirsty, polydipsia and polyuria. Denies blurry vision or numbness/tingling on hands. She reports more compliance with her diet and medical treatment. She stopped regular sodas and chips. But she still eats irregularly and constantly snacking.    Patient states that she has been taking her lantus and Victoza ( both at 1 am)consistently. However, she has not been compliant with Metformin due to the side effects of diarrhea. She checks her CBG once daily am and reports average CBGs of 90-150's. She did not bring her meter.  # Left hand pain, Tenosynovitis?   Patient has had a chronic hand pain and was referred to sports medicine Dr. Jennette Kettle who started to see her since 02/06/13. 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  Patient was started on Prednisone therapy since 02/06/13. She is currently on tapering dose per Dr. Jennette Kettle. She is scheduled to see Dr. Jennette Kettle in am on 03/27/13. Of note, she has negative ANA and RF in May 2014.   Health maintenance 1. Mammogram- referred ( two sisters have breast cancer). 2. Colonscopy-referred 3. Flu shot-today  Past Medical History  Diagnosis Date  . Adhesive capsulitis of right shoulder     with underlying tendinopathy rotator cuff  . History of  post-sterilization tuboplasty 2000  . Tear of meniscus of left knee     x2  . Tear of meniscus of right knee   . Plantar fasciitis     Right  . Shortness of breath     with exertion  . Diabetes mellitus     oral tx  . GERD (gastroesophageal reflux disease)   . Arthritis   . Sleep apnea 5 plus yrs    study -pt could not sleep test inconclusive.    Current Outpatient Prescriptions  Medication Sig Dispense Refill  . colchicine 0.6 MG tablet Take 1 tablet (0.6 mg total) by mouth 2 (two) times daily.  60 tablet  0  . HYDROcodone-acetaminophen (NORCO) 10-325 MG per tablet Take 1 tablet by mouth every 6 (six) hours as needed for pain.  30 tablet  0  . insulin glargine (LANTUS) 100 UNIT/ML injection Inject 15 units at bedtime  10 mL    . metFORMIN (GLUCOPHAGE) 500 MG tablet Take 1 tablet (500 mg total) by mouth 2 (two) times daily with a meal.  60 tablet  11  . omeprazole (PRILOSEC) 20 MG capsule Take 1 capsule (20 mg total) by mouth daily.  30 capsule  1  . pravastatin (PRAVACHOL) 40 MG tablet Take 1 tablet (40 mg total) by mouth daily.  30 tablet  11  . predniSONE (DELTASONE) 20 MG tablet Patient has written taper  30 tablet  2  . traMADol (ULTRAM) 50 MG tablet Take 2  tablets (100 mg total) by mouth every 8 (eight) hours as needed for pain.  180 tablet  0   No current facility-administered medications for this visit.   Family History  Problem Relation Age of Onset  . Cancer Sister     both sisters have breast cancer   History   Social History  . Marital Status: Married    Spouse Name: N/A    Number of Children: N/A  . Years of Education: N/A   Social History Main Topics  . Smoking status: Never Smoker   . Smokeless tobacco: Never Used  . Alcohol Use: No  . Drug Use: No  . Sexual Activity: None   Other Topics Concern  . None   Social History Narrative   Married, unemployed, Husband disabled and paraplegic 2/2 fall from tree stand while deer hunting, Son quadriplegic 2/2  MVA 05/2007 in [place]  Review of Systems:  Constitutional:  Denies fever, chills, diaphoresis, appetite change and positive for tiredness and fatigue at times.   HEENT:  Denies congestion, sore throat, rhinorrhea, sneezing, mouth sores, trouble swallowing, neck pain   Respiratory:  Denies SOB, DOE, cough, and wheezing.   Cardiovascular:  Denies palpitations and leg swelling.   Gastrointestinal:  Denies nausea, vomiting, abdominal pain, diarrhea, constipation, blood in stool and abdominal distention.   Genitourinary:  Denies dysuria, urgency, frequency, hematuria, flank pain and difficulty urinating.   Musculoskeletal:  Denies myalgias, back pain, and gait problem. Left hand swelling and pain  Skin:  Denies pallor, rash and wound.   Neurological:  Denies dizziness, seizures, syncope, weakness, light-headedness, numbness and headaches.    .   Objective:  Physical Exam: Filed Vitals:   03/26/13 1400  BP: 106/74  Pulse: 76  Temp: 97.4 F (36.3 C)  TempSrc: Oral  Height: 5\' 1"  (1.549 m)  Weight: 203 lb 1.6 oz (92.126 kg)  SpO2: 99%   General: NAD Head: normocephalic and atraumatic.  Neck: supple, full ROM, no thyromegaly. Lungs: normal respiratory effort, no accessory muscle use, normal breath sounds, no crackles, and no wheezes. Heart: normal rate, regular rhythm, no murmur, no gallop, and no rub.  Abdomen: soft, non-tender, normal bowel sounds, no distention, no guarding, no rebound tenderness. MSK: chronic left hand pain and mild swelling followed up by her sports medicine. No erythema, tenderness, or warmth to touch. Pulses: 2+ DP/PT pulses bilaterally Extremities: No cyanosis, clubbing, edema Neurologic: alert & oriented X3.     Assessment & Plan:

## 2013-03-26 NOTE — Assessment & Plan Note (Signed)
Assessment  Uncontrolled type 2 DM due to dietary and medical noncompliance. She is more compliant with her diet and insulin since last OV, and reports better am fasting BGs.  I am concerned about her prandial BGS control given her metformin intolerance and been on oral prednisone since the end of July. And her dietary discretion is an ongoing problem even though she is better on controlling regular soda and chips consumption.   Plan: - Will continue the current regimen ( Lantus 15 QS and Victoza) - she will benefit from adding prandial coverage ( humalog/Novolog). However, it will be hard to add proper dosage since she does not eat regularly and snacks all day long. I discussed with her about the importance of eating regularly, preferably three meals daily and stopping snacking between meals and at bedtime. Another concern is her medical noncompliance.  Given her busy schedule as the primary caregiver to her husband and son, her medical compliance has always been an issue.  - Patient agrees with plan to try to regulate her meal habit and she would like to defer the Prandial coverage for now.  - Monthly visit with me is very important until we can get her A1C under 7.0.

## 2013-03-26 NOTE — Assessment & Plan Note (Signed)
Followed by DR. Jennette Kettle.  - will contact Dr. Jennette Kettle to see if we need a full rheumatology workup. ( ANA and RF negative) - it will be hard for her to see a rheumatologist given her low socioeconomic state and no insurance.

## 2013-03-27 ENCOUNTER — Ambulatory Visit: Payer: No Typology Code available for payment source | Admitting: Family Medicine

## 2013-03-27 NOTE — Progress Notes (Signed)
Case discussed with Dr. Li at the time of the visit.  We reviewed the resident's history and exam and pertinent patient test results.  I agree with the assessment, diagnosis, and plan of care documented in the resident's note. 

## 2013-04-02 ENCOUNTER — Other Ambulatory Visit: Payer: Self-pay | Admitting: *Deleted

## 2013-04-02 DIAGNOSIS — M722 Plantar fascial fibromatosis: Secondary | ICD-10-CM

## 2013-04-02 MED ORDER — TRAMADOL HCL 50 MG PO TABS: 100.0000 mg | ORAL_TABLET | Freq: Three times a day (TID) | ORAL | Status: DC | PRN

## 2013-04-02 NOTE — Telephone Encounter (Signed)
Called to pharm 

## 2013-04-03 ENCOUNTER — Ambulatory Visit: Payer: No Typology Code available for payment source | Admitting: Family Medicine

## 2013-04-08 ENCOUNTER — Other Ambulatory Visit: Payer: Self-pay | Admitting: *Deleted

## 2013-04-09 ENCOUNTER — Ambulatory Visit (HOSPITAL_COMMUNITY): Payer: No Typology Code available for payment source

## 2013-04-10 ENCOUNTER — Ambulatory Visit (INDEPENDENT_AMBULATORY_CARE_PROVIDER_SITE_OTHER): Payer: No Typology Code available for payment source | Admitting: Family Medicine

## 2013-04-10 VITALS — BP 113/76 | Ht 61.0 in | Wt 200.0 lb

## 2013-04-10 DIAGNOSIS — M65839 Other synovitis and tenosynovitis, unspecified forearm: Secondary | ICD-10-CM

## 2013-04-10 DIAGNOSIS — M659 Synovitis and tenosynovitis, unspecified: Secondary | ICD-10-CM

## 2013-04-13 NOTE — Assessment & Plan Note (Signed)
Her symptoms seem quite steroid dependent. She reports increase in symptoms within 12-16 hours of missing a steroid dose which seems a bit odd to me but could indicate we are right on the borderline of treating her tenosynovitis. 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 Her rheumatoid workup has essentially been negative. She did have quite dramatic fluid in the flexor digitorum superficialis of the left hand on both ultrasound and MRI. Initially she was unable to flex the fingers of the left hand and she had dramatic improvement in symptoms within a few days of steroids. We have managed to maintain her now for almost 2 months on steroids which I would like to stop given her diabetic status. At this point I don't know what else to do but to keep her on low-dose steroids and follow her up in one to 2 months. Certainly would be nice if we had lecture the of rheumatology referral given her insurance status that's not a possibility at this time.

## 2013-04-13 NOTE — Progress Notes (Signed)
  Subjective:    Patient ID: [name], female    DOB: Sep 22, 1960, 52 y.o.   MRN: [id number]  HPI Followup idiopathic left hand swelling. We had kept her on 20 mg a day steroids. She missed one dose and within 12-16 hours she started having hand swelling. Has not quite resolved yet. Her blood sugars have actually been in pretty good control according to her. No new symptoms.   Review of Systems No fever, sweats, chills. No unusual weight loss.. No unusual bruising.    Objective:   Physical Exam Vital signs are reviewed GENERAL: Well-developed female no acute distress WRIST: Left. Full range of motion in flexion and extension. She has some mild stiffness in before extension. The hand exhibits some dorsal swelling that is nonpitting. There is no erythema, no rash. Full flexion of all fingers although it is somewhat stiff. Negative Tinel at the wrist. Balance is negative.       Assessment & Plan:

## 2013-04-16 ENCOUNTER — Other Ambulatory Visit: Payer: Self-pay | Admitting: *Deleted

## 2013-04-16 MED ORDER — HYDROCODONE-ACETAMINOPHEN 10-325 MG PO TABS: 1.0000 | ORAL_TABLET | Freq: Four times a day (QID) | ORAL | Status: DC | PRN

## 2013-04-16 NOTE — Telephone Encounter (Signed)
Rx called in to pharmacy and message left on ID recording Rx ready at pharmacy in 2 hours. OP pharmacy called and states pt is getting 2 days early. Dr Dierdre Searles aware - ok.

## 2013-04-20 ENCOUNTER — Telehealth: Payer: Self-pay | Admitting: Dietician

## 2013-04-20 NOTE — Telephone Encounter (Addendum)
Her sister in [place] is on 5 mg Farxiga (dapagliflozin) for a month and it is helping her blood sugars(always < 125), weight, and she's come off lantus and is feeling better. Shalisha and her husband Bradly Chris both want to try this new medicine.  She asks me to ask both their doctors if this is possible and if they write a prescription please let them know to pick it up. (Ther is a savings card on line that [place] may be able to use to get if at no cost vs getting it through [facility])   CDE discussed the listed possible side effects on their website to her to be sure she was aware of them and how new this medicine is as well as when to contact us. She verbalized understanding.

## 2013-04-22 ENCOUNTER — Telehealth: Payer: Self-pay | Admitting: Internal Medicine

## 2013-04-22 ENCOUNTER — Ambulatory Visit: Payer: No Typology Code available for payment source

## 2013-04-22 NOTE — Telephone Encounter (Signed)
  INTERNAL MEDICINE RESIDENCY PROGRAM After-Hours Telephone Call    Reason for call:   I placed an outgoing call to [name] at 4 PM regarding to her request on Farxiga. Patient states that she would like to try this medication since " it helped my sister."   [name] is a sodium-glucose cotransporter 2 SGLT2 inhibitor which is a fairly new medication on the market. The major side effects include hypotension, renal function impairment, increased LDL, bladder cancer.     Assessment/ Plan:   I discussed with patient that given her elevated HGB A1C of 10.9, we will need to continue insulin therapy to break the glucose toxicity at present. Once her HGB A1C could come down to 8ish. We could try a similar medication along with diet and exercise to further control her CBGs . And given her hx of medical and dietary noncompliance,  I would aslo recs for her to see [name] on a regular basis.   Patient agree with above plan. she has a f/u appt with me next week.   As always, pt is advised that if symptoms worsen or new symptoms arise, they should go to an urgent care facility or to to ER for further evaluation.    Marisa Query, MD   04/22/2013, 4:18 PM

## 2013-04-28 ENCOUNTER — Other Ambulatory Visit: Payer: Self-pay | Admitting: *Deleted

## 2013-04-28 DIAGNOSIS — M722 Plantar fascial fibromatosis: Secondary | ICD-10-CM

## 2013-04-29 ENCOUNTER — Other Ambulatory Visit: Payer: Self-pay | Admitting: Internal Medicine

## 2013-04-29 DIAGNOSIS — M722 Plantar fascial fibromatosis: Secondary | ICD-10-CM

## 2013-04-29 MED ORDER — TRAMADOL HCL 50 MG PO TABS: 100.0000 mg | ORAL_TABLET | Freq: Three times a day (TID) | ORAL | Status: DC | PRN

## 2013-04-29 NOTE — Progress Notes (Signed)
Rx called in to pharmacy and pt notified Rx will be ready on 05/01/13 per Dr Dierdre Searles. Stanton Kidney Safira Proffit RN 04/29/13 9:30AM

## 2013-04-30 ENCOUNTER — Encounter: Payer: Self-pay | Admitting: Internal Medicine

## 2013-05-21 ENCOUNTER — Other Ambulatory Visit: Payer: Self-pay | Admitting: *Deleted

## 2013-05-22 MED ORDER — HYDROCODONE-ACETAMINOPHEN 10-325 MG PO TABS: 1.0000 | ORAL_TABLET | Freq: Four times a day (QID) | ORAL | Status: DC | PRN

## 2013-05-22 NOTE — Telephone Encounter (Signed)
Called pt rx ready for pickup 

## 2013-05-26 ENCOUNTER — Telehealth: Payer: Self-pay | Admitting: *Deleted

## 2013-05-26 NOTE — Telephone Encounter (Signed)
Pt requesting a refill on prednisone 20mg  sent to [facility], [address] pt has an appt 11/21, would like enough sent to get her to that appt

## 2013-05-27 ENCOUNTER — Other Ambulatory Visit: Payer: Self-pay | Admitting: Family Medicine

## 2013-05-27 MED ORDER — PREDNISONE 20 MG PO TABS: ORAL_TABLET | ORAL | Status: DC

## 2013-05-28 ENCOUNTER — Ambulatory Visit: Payer: Self-pay

## 2013-06-01 ENCOUNTER — Ambulatory Visit: Payer: Self-pay

## 2013-06-03 ENCOUNTER — Ambulatory Visit: Payer: Self-pay

## 2013-06-04 ENCOUNTER — Other Ambulatory Visit: Payer: Self-pay | Admitting: *Deleted

## 2013-06-04 ENCOUNTER — Telehealth: Payer: Self-pay | Admitting: *Deleted

## 2013-06-04 DIAGNOSIS — M722 Plantar fascial fibromatosis: Secondary | ICD-10-CM

## 2013-06-05 ENCOUNTER — Ambulatory Visit: Payer: Self-pay | Admitting: Family Medicine

## 2013-06-05 MED ORDER — TRAMADOL HCL 50 MG PO TABS: 100.0000 mg | ORAL_TABLET | Freq: Three times a day (TID) | ORAL | Status: DC | PRN

## 2013-06-05 NOTE — Telephone Encounter (Signed)
Called to pharmacy 

## 2013-06-05 NOTE — Telephone Encounter (Signed)
done

## 2013-06-15 ENCOUNTER — Encounter: Payer: Self-pay | Admitting: Internal Medicine

## 2013-06-15 ENCOUNTER — Ambulatory Visit (INDEPENDENT_AMBULATORY_CARE_PROVIDER_SITE_OTHER): Payer: No Typology Code available for payment source | Admitting: Internal Medicine

## 2013-06-15 VITALS — BP 100/59 | HR 83 | Temp 98.2°F | Ht 61.0 in | Wt 211.8 lb

## 2013-06-15 DIAGNOSIS — M65839 Other synovitis and tenosynovitis, unspecified forearm: Secondary | ICD-10-CM

## 2013-06-15 DIAGNOSIS — M549 Dorsalgia, unspecified: Secondary | ICD-10-CM

## 2013-06-15 DIAGNOSIS — IMO0001 Reserved for inherently not codable concepts without codable children: Secondary | ICD-10-CM

## 2013-06-15 DIAGNOSIS — M659 Synovitis and tenosynovitis, unspecified: Secondary | ICD-10-CM

## 2013-06-15 DIAGNOSIS — G8929 Other chronic pain: Secondary | ICD-10-CM | POA: Insufficient documentation

## 2013-06-15 DIAGNOSIS — K219 Gastro-esophageal reflux disease without esophagitis: Secondary | ICD-10-CM

## 2013-06-15 LAB — LIPID PANEL
Cholesterol: 175 mg/dL (ref 0–200)
HDL: 58 mg/dL (ref >39)
LDL Cholesterol: 71 mg/dL (ref 0–99)
Total CHOL/HDL Ratio: 3 Ratio
Triglycerides: 228 mg/dL — ABNORMAL HIGH (ref <150)
VLDL: 46 mg/dL — ABNORMAL HIGH (ref 0–40)

## 2013-06-15 LAB — POCT GLYCOSYLATED HEMOGLOBIN (HGB A1C): Hemoglobin A1C: 7.9

## 2013-06-15 LAB — GLUCOSE, CAPILLARY: Glucose-Capillary: 117 mg/dL — ABNORMAL HIGH (ref 70–99)

## 2013-06-15 MED ORDER — OMEPRAZOLE 20 MG PO CPDR: 20.0000 mg | DELAYED_RELEASE_CAPSULE | Freq: Every day | ORAL | Status: DC

## 2013-06-15 MED ORDER — HYDROCODONE-ACETAMINOPHEN 10-325 MG PO TABS: 1.0000 | ORAL_TABLET | Freq: Four times a day (QID) | ORAL | Status: DC | PRN

## 2013-06-15 MED ORDER — CANAGLIFLOZIN 100 MG PO TABS: 100.0000 mg | ORAL_TABLET | Freq: Every day | ORAL | Status: DC

## 2013-06-15 NOTE — Patient Instructions (Addendum)
1. Will try to send rheumatology referral once your insurance is approved. 2. Will give you Invokana 3. Increase your Norco to # 80 tab/ month 4. Follow up in one week.   Canagliflozin oral tablets What is this medicine? CANAGLIFLOZIN (KAN a gli FLOE zin) helps to treat type 2 diabetes. It helps to control blood sugar. Treatment is combined with diet and exercise. This medicine may be used for other purposes; ask your health care provider or pharmacist if you have questions. COMMON BRAND NAME(S): Invokana What should I tell my health care provider before I take this medicine? They need to know if you have any of these conditions: -dehydration -diabetic ketoacidosis -diet low in salt -high cholesterol -high levels of potassium in the blood -history of yeast infection of the penis or vagina -kidney disease -liver disease -low blood pressure -on hemodialysis -type 1 diabetes -uncircumcised female -an unusual or allergic reaction to canagliflozin, other medicines, foods, dyes, or preservatives -pregnant or trying to get pregnant -breast-feeding How should I use this medicine? Take this medicine by mouth with a glass of water. Follow the directions on the prescription label. Take it before the first meal of the day. Take your dose at the same time each day. Do not take more often than directed. Do not stop taking except on your doctor's advice. A special MedGuide will be given to you by the pharmacist with each prescription and refill. Be sure to read this information carefully each time. Talk to your pediatrician regarding the use of this medicine in children. Special care may be needed. Overdosage: If you think you've taken too much of this medicine contact a poison control center or emergency room at once. Overdosage: If you think you have taken too much of this medicine contact a poison control center or emergency room at once. NOTE: This medicine is only for you. Do not share this  medicine with others. What if I miss a dose? If you miss a dose, take it as soon as you can. If it is almost time for your next dose, take only that dose. Do not take double or extra doses. What may interact with this medicine? Do not take this medicine with any of the following medications: -gatifloxacinThis medicine may also interact with the following medications: -alcohol -certain medicines for blood pressure, heart disease -digoxin -diuretics -insulin -nateglinide -phenobarbital -phenytoin -repaglinide -rifampin -ritonavir -sulfonylureas like glimepiride, glipizide, glyburide This list may not describe all possible interactions. Give your health care provider a list of all the medicines, herbs, non-prescription drugs, or dietary supplements you use. Also tell them if you smoke, drink alcohol, or use illegal drugs. Some items may interact with your medicine. What should I watch for while using this medicine? Visit your doctor or health care professional for regular checks on your progress. A test called the HbA1C (A1C) will be monitored. This is a simple blood test. It measures your blood sugar control over the last 2 to 3 months. You will receive this test every 3 to 6 months. Learn how to check your blood sugar. Learn the symptoms of low and high blood sugar and how to manage them. Always carry a quick-source of sugar with you in case you have symptoms of low blood sugar. Examples include hard sugar candy or glucose tablets. Make sure others know that you can choke if you eat or drink when you develop serious symptoms of low blood sugar, such as seizures or unconsciousness. They must get medical help at  once. Tell your doctor or health care professional if you have high blood sugar. You might need to change the dose of your medicine. If you are sick or exercising more than usual, you might need to change the dose of your medicine. Do not skip meals. Ask your doctor or health care  professional if you should avoid alcohol. Many nonprescription cough and cold products contain sugar or alcohol. These can affect blood sugar. Wear a medical ID bracelet or chain, and carry a card that describes your disease and details of your medicine and dosage times. What side effects may I notice from receiving this medicine? Side effects that you should report to your doctor or health care professional as soon as possible: -allergic reactions like skin rash, itching or hives, swelling of the face, lips, or tongue -breathing problems -chest pain -dizziness -fast or irregular heartbeat -feeling faint or lightheaded, falls -fever, chills -muscle weakness -signs and symptoms of low blood sugar such as feeling anxious, confusion, dizziness, increased hunger, unusually weak or tired, sweating, shakiness, cold, irritable, headache, blurred vision, fast heartbeat, loss of consciousness -trouble passing urine or change in the amount of urine -penile discharge, itching, or pain in men -vaginal discharge, itching, or odor in women  Side effects that usually do not require medical attention (Report these to your doctor or health care professional if they continue or are bothersome.): -constipation -increased urination -nausea -thirsty This list may not describe all possible side effects. Call your doctor for medical advice about side effects. You may report side effects to FDA at 1-800-FDA-1088. Where should I keep my medicine? Keep out of the reach of children. Store at room temperature between 20 and 25 degrees C (68 and 77 degrees F). Throw away any unused medicine after the expiration date. NOTE: This sheet is a summary. It may not cover all possible information. If you have questions about this medicine, talk to your doctor, pharmacist, or health care provider.  2014, Elsevier/Gold Standard. (2012-10-15 14:08:06)

## 2013-06-15 NOTE — Assessment & Plan Note (Addendum)
Assessment Patient has had chronic lower back pain and been taking Tramadol and Hydrocodone. She denies sciatica, lower extremity weakness, or bowel/urinary incontinence. She states that her current pain medication is not enough for her pain. Physical exam shows paraspinal tenderness to palpation.  Straight leg raising test negative.  Plan: - continue Tramadol - increase Hydrocodone to 1 tab BID PRN # 80/month - Patient should follow up with her sports medicine Dr. Jennette Kettle as well.

## 2013-06-15 NOTE — Assessment & Plan Note (Signed)
Assessment: Her symptoms of pain and swelling are well controlled on Prednisone, which has been managed by Dr. Jennette Kettle.  Plan: - Continue the current therapy by Dr. Jennette Kettle. - Patient states that she is in the process of getting [employer] insurance, and she will likely get it in one month. Will send the Rheumatology referral once she receives her insurance.

## 2013-06-15 NOTE — Progress Notes (Signed)
Subjective:   Patient ID: [name] female   DOB: June 11, 1961 52 y.o.   MRN: [id number]  Chief complaints: left hand pain  HPI: [name] is a 52 y.o. woman with PMH of uncontrolled DM, OSA, GERD, hx of B/L knee meniscus tears and right shoulder tendinopathy, who present to the clinic for follow up.  She has a lot of life stressors. Her husband is paraplegia and her son is quadriplegia. 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 She states that she forgets to take her insulin or metformin due to her busy schedule. She admits dietary discretion.   # Uncontrolled DM,.     Patient reports feeling fine and denies thirsty, polydipsia and polyuria. Denies blurry vision or numbness/tingling on hands/feets. She reports more compliance with her diet and medical treatment. Patient states that she has been taking her, metformin, lantus and Victoza consistently.   # Left hand pain, Tenosynovitis?   Patient has had a chronic hand pain and was referred to sports medicine Dr. Jennette Kettle who started to see her since 02/06/13. 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  Patient was started on Prednisone therapy since 02/06/13. She is currently on tapering dose per Dr. Jennette Kettle. Per Dr. Donnetta Hail note on 04/10/13. She recs either low dose prednisone and/or Rheumatology referral ( which will be difficult since she does not have insurance).   Health maintenance 1. Mammogram- referred ( two sisters have breast cancer). 2. Colonscopy-referred 3. A1C, Lipid panel, Urine microalbumin done today 4. Ophthalmology exam--deferred   Past Medical History  Diagnosis Date  . Adhesive capsulitis of right shoulder     with underlying tendinopathy rotator cuff  . History of post-sterilization tuboplasty 2000  . Tear of meniscus of left knee     x2  . Tear of meniscus of right knee   . Plantar fasciitis     Right  . Shortness of breath     with exertion   . Diabetes mellitus     oral tx  . GERD (gastroesophageal reflux disease)   . Arthritis   . Sleep apnea 5 plus yrs    study -pt could not sleep test inconclusive.    Current Outpatient Prescriptions  Medication Sig Dispense Refill  . HYDROcodone-acetaminophen (NORCO) 10-325 MG per tablet Take 1 tablet by mouth every 6 (six) hours as needed.  30 tablet  0  . insulin glargine (LANTUS) 100 UNIT/ML injection Inject 15 units at bedtime  10 mL    . LIRAGLUTIDE  Inject 1.8 mg into the skin daily.      . metFORMIN (GLUCOPHAGE) 500 MG tablet Take 1 tablet (500 mg total) by mouth 2 (two) times daily with a meal.  60 tablet  11  . omeprazole (PRILOSEC) 20 MG capsule Take 1 capsule (20 mg total) by mouth daily.  30 capsule  1  . pravastatin (PRAVACHOL) 40 MG tablet Take 1 tablet (40 mg total) by mouth daily.  30 tablet  11  . predniSONE (DELTASONE) 20 MG tablet Patient has written taper  30 tablet  2  . traMADol (ULTRAM) 50 MG tablet Take 2 tablets (100 mg total) by mouth every 8 (eight) hours as needed.  180 tablet  0   No current facility-administered medications for this visit.   Family History  Problem Relation Age of Onset  . Cancer Sister     both sisters have breast cancer   History  Social History  . Marital Status: Married    Spouse Name: N/A    Number of Children: N/A  . Years of Education: N/A   Social History Main Topics  . Smoking status: Never Smoker   . Smokeless tobacco: Never Used  . Alcohol Use: No  . Drug Use: No  . Sexual Activity: None   Other Topics Concern  . None   Social History Narrative   Married, unemployed, Husband disabled and paraplegic 2/2 fall from tree stand while deer hunting, Son quadriplegic 2/2 MVA 05/2007 in [place]  Review of Systems:  Constitutional:  Denies fever, chills, diaphoresis, appetite change.  HEENT:  Denies congestion, sore throat, rhinorrhea, sneezing, mouth sores, trouble swallowing, neck pain   Respiratory:  Denies SOB,  DOE, cough, and wheezing.   Cardiovascular:  Denies palpitations and leg swelling.   Gastrointestinal:  Denies nausea, vomiting, abdominal pain, diarrhea, constipation, blood in stool and abdominal distention.   Genitourinary:  Denies dysuria, urgency, frequency, hematuria, flank pain and difficulty urinating.   Musculoskeletal:  Denies myalgias, back pain, and gait problem. Left hand swelling and pain  Skin:  Denies pallor, rash and wound.   Neurological:  Denies dizziness, seizures, syncope, weakness, light-headedness, numbness and headaches.    .   Objective:  Physical Exam: Filed Vitals:   06/15/13 1022  BP: 100/59  Pulse: 83  Temp: 98.2 F (36.8 C)  TempSrc: Oral  Height: 5\' 1"  (1.549 m)  Weight: 211 lb 12.8 oz (96.072 kg)  SpO2: 98%   General: NAD Head: normocephalic and atraumatic.  Neck: supple, full ROM, no thyromegaly. Lungs: normal respiratory effort, no accessory muscle use, normal breath sounds, no crackles, and no wheezes. Heart: normal rate, regular rhythm, no murmur, no gallop, and no rub.  Abdomen: soft, non-tender, normal bowel sounds, no distention, no guarding, no rebound tenderness. MSK: chronic left hand pain and mild swelling followed up by her sports medicine. No erythema, tenderness, or warmth to touch. Lumbar Paraspinal tenderness to palpation.  Straight leg raising test negative  Pulses: 2+ DP/PT pulses bilaterally Extremities: No cyanosis, clubbing, edema Neurologic: alert & oriented X3.     Assessment & Plan:

## 2013-06-15 NOTE — Assessment & Plan Note (Addendum)
Lab Results  Component Value Date   HGBA1C 7.9 06/15/2013   HGBA1C 10.9 02/26/2013   HGBA1C 10.0 12/02/2012     Assessment: Diabetes control: fair control Progress toward A1C goal:  improved Patient would like to discuss about adding an new DM  medication to her regimen since her sister benefits from SGLT-2 inhibitor.   Plan: # Medications:    continue current medications  Will add Invokana for better CBG control and benefit of weight loss. Thorough discussion on Benefit and side effects. Patient understands                           # Home glucose monitoring: Frequency: once a day Timing: before breakfast Instruction/counseling given: reminded to get eye exam, reminded to bring blood glucose meter & log to each visit, reminded to bring medications to each visit, discussed foot care, discussed the need for weight loss, discussed diet, discussed sick day management and provided printed educational material  # Follow up with Lupita Leash every 1-2 months.

## 2013-06-16 LAB — MICROALBUMIN / CREATININE URINE RATIO
Creatinine, Urine: 144.1 mg/dL
Microalb Creat Ratio: 3.5 mg/g (ref 0.0–30.0)
Microalb, Ur: 0.5 mg/dL (ref 0.00–1.89)

## 2013-06-19 ENCOUNTER — Encounter: Payer: Self-pay | Admitting: Family Medicine

## 2013-06-19 ENCOUNTER — Ambulatory Visit (INDEPENDENT_AMBULATORY_CARE_PROVIDER_SITE_OTHER): Payer: No Typology Code available for payment source | Admitting: Family Medicine

## 2013-06-19 VITALS — BP 116/81 | HR 69 | Ht 61.0 in | Wt 211.0 lb

## 2013-06-19 DIAGNOSIS — M65839 Other synovitis and tenosynovitis, unspecified forearm: Secondary | ICD-10-CM

## 2013-06-19 DIAGNOSIS — M659 Synovitis and tenosynovitis, unspecified: Secondary | ICD-10-CM

## 2013-06-19 NOTE — Assessment & Plan Note (Signed)
Encouraged by her ability to taper steroids down. We'll continue and see her back in 8 weeks.

## 2013-06-19 NOTE — Progress Notes (Signed)
   Subjective:    Patient ID: [name], female    DOB: [birth year], 52 y.o.   MRN: [id number]  HPI  Followup left hand and wrist pain and swelling. Has been able to taper her steroids down sometimes going as long as a week without steroids before she starts to have pain and swelling again. Her he will A1c at last check earlier this month was 7.1. She's gained a lot of weight and she would like to speed up to taper off of the prednisone.  Review of Systems Weight gain as above.    Objective:   Physical Exam  Vital signs are reviewed GENERAL: Well-developed female no acute distress RISK: Left. Negative Tinel. Mild swelling in the dorsum left hand. Full range of motion of all fingers. Normal strength in abduction abduction wrist flexion and extension.      Assessment & Plan:

## 2013-06-25 NOTE — Progress Notes (Signed)
Case discussed with Dr. Li soon after the resident saw the patient. We reviewed the resident's history and exam and pertinent patient test results. I agree with the assessment, diagnosis, and plan of care documented in the resident's note. 

## 2013-06-26 ENCOUNTER — Ambulatory Visit: Payer: No Typology Code available for payment source | Admitting: Family Medicine

## 2013-07-08 ENCOUNTER — Emergency Department (HOSPITAL_COMMUNITY): Payer: No Typology Code available for payment source

## 2013-07-08 ENCOUNTER — Emergency Department (HOSPITAL_COMMUNITY)
Admission: EM | Admit: 2013-07-08 | Discharge: 2013-07-08 | Disposition: A | Discharge status: Home / Self Care | Payer: No Typology Code available for payment source | Attending: Emergency Medicine | Admitting: Emergency Medicine

## 2013-07-08 ENCOUNTER — Encounter (HOSPITAL_COMMUNITY): Payer: Self-pay | Admitting: Emergency Medicine

## 2013-07-08 DIAGNOSIS — R0609 Other forms of dyspnea: Secondary | ICD-10-CM | POA: Insufficient documentation

## 2013-07-08 DIAGNOSIS — M129 Arthropathy, unspecified: Secondary | ICD-10-CM | POA: Insufficient documentation

## 2013-07-08 DIAGNOSIS — K219 Gastro-esophageal reflux disease without esophagitis: Secondary | ICD-10-CM | POA: Insufficient documentation

## 2013-07-08 DIAGNOSIS — Z79899 Other long term (current) drug therapy: Secondary | ICD-10-CM | POA: Insufficient documentation

## 2013-07-08 DIAGNOSIS — E119 Type 2 diabetes mellitus without complications: Secondary | ICD-10-CM | POA: Insufficient documentation

## 2013-07-08 DIAGNOSIS — R072 Precordial pain: Secondary | ICD-10-CM | POA: Insufficient documentation

## 2013-07-08 DIAGNOSIS — R0989 Other specified symptoms and signs involving the circulatory and respiratory systems: Secondary | ICD-10-CM | POA: Insufficient documentation

## 2013-07-08 DIAGNOSIS — R079 Chest pain, unspecified: Secondary | ICD-10-CM

## 2013-07-08 DIAGNOSIS — Z794 Long term (current) use of insulin: Secondary | ICD-10-CM | POA: Insufficient documentation

## 2013-07-08 DIAGNOSIS — M25511 Pain in right shoulder: Secondary | ICD-10-CM

## 2013-07-08 DIAGNOSIS — M25519 Pain in unspecified shoulder: Secondary | ICD-10-CM | POA: Insufficient documentation

## 2013-07-08 LAB — BASIC METABOLIC PANEL
BUN: 9 mg/dL (ref 6–23)
CO2: 25 mEq/L (ref 19–32)
Calcium: 9.3 mg/dL (ref 8.4–10.5)
Chloride: 99 mEq/L (ref 96–112)
Creatinine, Ser: 0.53 mg/dL (ref 0.50–1.10)
GFR calc Af Amer: >90 mL/min (ref >90)
GFR calc non Af Amer: >90 mL/min (ref >90)
Glucose, Bld: 156 mg/dL — ABNORMAL HIGH (ref 70–99)
Potassium: 4.4 mEq/L (ref 3.5–5.1)
Sodium: 135 mEq/L (ref 135–145)

## 2013-07-08 LAB — POCT I-STAT TROPONIN I: Troponin i, poc: 0 ng/mL (ref 0.00–0.08)

## 2013-07-08 LAB — CBC
HCT: 45.4 % (ref 36.0–46.0)
Hemoglobin: 16.3 g/dL — ABNORMAL HIGH (ref 12.0–15.0)
MCH: 30.8 pg (ref 26.0–34.0)
MCHC: 35.9 g/dL (ref 30.0–36.0)
MCV: 85.7 fL (ref 78.0–100.0)
Platelets: 334 10*3/uL (ref 150–400)
RBC: 5.3 MIL/uL — ABNORMAL HIGH (ref 3.87–5.11)
RDW: 12.2 % (ref 11.5–15.5)
WBC: 8.6 10*3/uL (ref 4.0–10.5)

## 2013-07-08 LAB — D-DIMER, QUANTITATIVE: D-Dimer, Quant: 1.7 ug/mL-FEU — ABNORMAL HIGH (ref 0.00–0.48)

## 2013-07-08 MED ORDER — IOHEXOL 350 MG/ML SOLN
80.0000 mL | Freq: Once | INTRAVENOUS | Status: AC | PRN
  Administered 2013-07-08: 80 mL via INTRAVENOUS

## 2013-07-08 MED ORDER — NAPROXEN 500 MG PO TABS: 500.0000 mg | ORAL_TABLET | Freq: Two times a day (BID) | ORAL | Status: DC

## 2013-07-08 MED ORDER — IBUPROFEN 800 MG PO TABS
800.0000 mg | ORAL_TABLET | Freq: Once | ORAL | Status: AC
  Administered 2013-07-08: 800 mg via ORAL
  Filled 2013-07-08: qty 1

## 2013-07-08 MED ORDER — HYDROCODONE-ACETAMINOPHEN 5-325 MG PO TABS
1.0000 | ORAL_TABLET | Freq: Once | ORAL | Status: AC
  Administered 2013-07-08: 1 via ORAL
  Filled 2013-07-08: qty 1

## 2013-07-08 NOTE — ED Notes (Signed)
Pt reports going bowling recently and then onset of right shoulder pain Friday, pain is now down her right arm and intermittent sharp mid chest pains. ekg done at triage. Airway intact.

## 2013-07-08 NOTE — ED Provider Notes (Signed)
CSN: [id number]     Arrival date & time 07/08/13  1357 History   First MD Initiated Contact with Patient 07/08/13 1611     Chief Complaint  Patient presents with  . Arm Pain  . Chest Pain   (Consider location/radiation/quality/duration/timing/severity/associated sxs/prior Treatment) Patient is a 52 y.o. female presenting with arm pain and chest pain. The history is provided by the patient.  Arm Pain Associated symptoms include chest pain.  Chest Pain She has been having pain in her right shoulder for the last 5 days. Pain is dull and achy with some radiation to the clavicular area and down into her upper or mid pain is worse with movement. This morning, she started having intermittent, sharp pain in her mid sternal area without radiation. Pain is only present for a few seconds at a time before going away completely. When present, pain is rated at 8/10. It is sometimes worse with a deep breath. Nothing makes it better. There is mild associated dyspnea but no nausea or vomiting or diaphoresis. She denies cough or fever. Of note, she has chronic problems with inflammation in her left hand for which she takes prednisone intermittently. She had not taken any for about a week but did take a dose last night. She is a nonsmoker and denies history of hypertension or hyperlipidemia but does have history of diabetes.   Past Medical History  Diagnosis Date  . Adhesive capsulitis of right shoulder     with underlying tendinopathy rotator cuff  . History of post-sterilization tuboplasty 2000  . Tear of meniscus of left knee     x2  . Tear of meniscus of right knee   . Plantar fasciitis     Right  . Shortness of breath     with exertion  . Diabetes mellitus     oral tx  . GERD (gastroesophageal reflux disease)   . Arthritis   . Sleep apnea 5 plus yrs    study -pt could not sleep test inconclusive.    Past Surgical History  Procedure Laterality Date  . Cholecystectomy    . Meniscus repair     R and L (L x2)  . Tonsillectomy    . Knee arthroscopy  10/10/2011    Procedure: ARTHROSCOPY KNEE;  Surgeon: Nadara Mustard, MD;  Location: [facility] OR;  Service: Orthopedics;  Laterality: Right;  Right Knee Arthroscopy and Excision Medial Meniscus  . Tubal ligation     Family History  Problem Relation Age of Onset  . Cancer Sister     both sisters have breast cancer   History  Substance Use Topics  . Smoking status: Never Smoker   . Smokeless tobacco: Never Used  . Alcohol Use: No   OB History   Grav Para Term Preterm Abortions TAB SAB Ect Mult Living                 Review of Systems  Cardiovascular: Positive for chest pain.  All other systems reviewed and are negative.    Allergies  Ofloxacin  Home Medications   Current Outpatient Rx  Name  Route  Sig  Dispense  Refill  . Canagliflozin (INVOKANA) 100 MG TABS   Oral   Take 1 tablet (100 mg total) by mouth daily before breakfast.   30 tablet   0   . HYDROcodone-acetaminophen (NORCO) 10-325 MG per tablet   Oral   Take 1 tablet by mouth every 6 (six) hours as needed.   80 tablet  0     Rx 3/3.Give it to her after 08/16/13   . insulin glargine (LANTUS) 100 UNIT/ML injection      Inject 15 units at bedtime   10 mL      . Liraglutide (VICTOZA [place])   Subcutaneous   Inject 1.8 mg into the skin daily.         Marland Kitchen LIRAGLUTIDE [place]   Subcutaneous   Inject 1.8 mg into the skin daily.         . metFORMIN (GLUCOPHAGE) 500 MG tablet   Oral   Take 1 tablet (500 mg total) by mouth 2 (two) times daily with a meal.   60 tablet   11   . omeprazole (PRILOSEC) 20 MG capsule   Oral   Take 1 capsule (20 mg total) by mouth daily.   30 capsule   11   . pravastatin (PRAVACHOL) 40 MG tablet   Oral   Take 1 tablet (40 mg total) by mouth daily.   30 tablet   11   . predniSONE (DELTASONE) 20 MG tablet      Patient has written taper   30 tablet   2   . traMADol (ULTRAM) 50 MG tablet   Oral   Take 2 tablets (100 mg  total) by mouth every 8 (eight) hours as needed.   180 tablet   0    BP 119/72  Pulse 95  Temp(Src) 98.5 F (36.9 C) (Oral)  Resp 16  SpO2 97%  LMP 07/02/2013 Physical Exam  Nursing note and vitals reviewed.  52 year old female, resting comfortably and in no acute distress. Vital signs are normal. Oxygen saturation is 96%, which is normal. Head is normocephalic and atraumatic. PERRLA, EOMI. Oropharynx is clear. Neck is nontender and supple without adenopathy or JVD. Back is nontender and there is no CVA tenderness. Lungs are clear without rales, wheezes, or rhonchi. Chest is nontender. Heart has regular rate and rhythm without murmur. Abdomen is soft, flat, nontender without masses or hepatosplenomegaly and peristalsis is normoactive. Extremities have no cyanosis or edema. There is moderate tenderness to palpation in the right shoulder and pain with passive range of motion although full passive range of motion is present. Skin is warm and dry without rash. Neurologic: Mental status is normal, cranial nerves are intact, there are no motor or sensory deficits.  ED Course  Procedures (including critical care time) Labs Review Results for orders placed during the hospital encounter of 07/08/13  CBC      Result Value Range   WBC 8.6  4.0 - 10.5 K/uL   RBC 5.30 (*) 3.87 - 5.11 MIL/uL   Hemoglobin 16.3 (*) 12.0 - 15.0 g/dL   HCT 47.8  29.5 - 62.1 %   MCV 85.7  78.0 - 100.0 fL   MCH 30.8  26.0 - 34.0 pg   MCHC 35.9  30.0 - 36.0 g/dL   RDW 30.8  65.7 - 84.6 %   Platelets 334  150 - 400 K/uL  BASIC METABOLIC PANEL      Result Value Range   Sodium 135  135 - 145 mEq/L   Potassium 4.4  3.5 - 5.1 mEq/L   Chloride 99  96 - 112 mEq/L   CO2 25  19 - 32 mEq/L   Glucose, Bld 156 (*) 70 - 99 mg/dL   BUN 9  6 - 23 mg/dL   Creatinine, Ser 9.62  0.50 - 1.10 mg/dL   Calcium 9.3  8.4 - 10.5 mg/dL   GFR calc non Af Amer >90  >90 mL/min   GFR calc Af Amer >90  >90 mL/min  D-DIMER,  QUANTITATIVE      Result Value Range   D-Dimer, Quant 1.70 (*) 0.00 - 0.48 ug/mL-FEU  POCT I-STAT TROPONIN I      Result Value Range   Troponin i, poc 0.00  0.00 - 0.08 ng/mL   Comment 3            Imaging Review Dg Chest 2 View  07/08/2013   CLINICAL DATA:  Chest pain  EXAM: CHEST  2 VIEW  COMPARISON:  04/25/2011  FINDINGS: The heart size and mediastinal contours are within normal limits. Both lungs are clear. The visualized skeletal structures are unremarkable.  IMPRESSION: No active cardiopulmonary disease.   Electronically Signed   By: Marlan Palau M.D.   On: 07/08/2013 14:56   Ct Angio Chest Pe W/cm &/or Wo Cm  07/08/2013   CLINICAL DATA:  Chest pain.  Pulmonary embolism.  EXAM: CT ANGIOGRAPHY CHEST WITH CONTRAST  TECHNIQUE: Multidetector CT imaging of the chest was performed using the standard protocol during bolus administration of intravenous contrast. Multiplanar CT image reconstructions including MIPs were obtained to evaluate the vascular anatomy.  CONTRAST:  80mL OMNIPAQUE IOHEXOL 350 MG/ML SOLN  COMPARISON:  Chest radiograph 07/08/2013.  FINDINGS: There is no pulmonary embolus or acute aortic abnormality. The heart appears within normal limits. Incidental imaging of the upper abdomen is within normal limits. Lung windows demonstrate dependent atelectasis. The central airways are patent. The bones are within normal limits.  Review of the MIP images confirms the above findings.  IMPRESSION: Negative for pulmonary embolus or acute aortic abnormality. No acute cardiopulmonary disease.   Electronically Signed   By: Andreas Newport M.D.   On: 07/08/2013 19:33    EKG Interpretation    Date/Time:  Wednesday July 08 2013 14:08:55 EST Ventricular Rate:  97 PR Interval:  116 QRS Duration: 84 QT Interval:  374 QTC Calculation: 474 R Axis:   10 Text Interpretation:  Normal sinus rhythm Possible Inferior infarct , age undetermined Cannot rule out Anterior infarct , age undetermined  Abnormal ECG When compared with ECG of 04/25/2011, No significant change was found Confirmed by [facility]  MD, Cythia Bachtel ([id number]) on 07/08/2013 4:22:43 PM            MDM   1. Chest pain   2. Right shoulder pain    Right shoulder pain which seems to be musculoskeletal. She does have history of adhesive capsulitis in that shoulder. Chest pain does not appear typical of any of the medically serious causes of chest pain. I am suspicious with her history of inflammation in her left hand and now episodes of pleuritic pain in her chest whether this could be related to an autoimmune disorder such as rheumatoid arthritis or lupus. ECG is unremarkable as is troponin and CBC. D-dimer will be checked to rule out pulmonary embolism and she'll be given a therapeutic trial of ibuprofen.  She did not get significant relief from ibuprofen was given a dose of hydrocodone and acetaminophen for pain with good relief. CT angiogram is negative. Causes for chest pain is unclear but shoulder pain is probably an exacerbation of her previous shoulder injury. She is discharged with prescription for naproxen and is advised to use her tramadol and hydrocodone acetaminophen as needed at home. Followup with PCP in the next week and also with her orthopedic physician.  Dione Booze, MD 07/08/13 [phone]

## 2013-07-08 NOTE — ED Notes (Signed)
Pt c/o headache and states she is hungry.  Notified of elevated d-dimer and that she needs to be npo until further notice.

## 2013-07-15 ENCOUNTER — Ambulatory Visit (INDEPENDENT_AMBULATORY_CARE_PROVIDER_SITE_OTHER): Payer: No Typology Code available for payment source | Admitting: Sports Medicine

## 2013-07-15 VITALS — BP 119/78

## 2013-07-15 DIAGNOSIS — M25511 Pain in right shoulder: Secondary | ICD-10-CM

## 2013-07-15 DIAGNOSIS — M25519 Pain in unspecified shoulder: Secondary | ICD-10-CM

## 2013-07-15 MED ORDER — METHYLPREDNISOLONE ACETATE 40 MG/ML IJ SUSP
40.0000 mg | Freq: Once | INTRAMUSCULAR | Status: AC
  Administered 2013-07-15: 40 mg via INTRA_ARTICULAR

## 2013-07-15 NOTE — Progress Notes (Signed)
   Subjective:    Patient ID: Billie Ruddy, female    DOB: 1961-04-11, 52 y.o.   MRN: [id number]  HPI chief complaint: Right shoulder pain  Very pleasant right-hand-dominant female comes in today complaining of right shoulder pain. Pain began suddenly on December 19 without any known injury. She describes an aching discomfort in the anterior and lateral shoulder. Pain was so severe that she went to the emergency room on December 24th. A prescription for an anti-inflammatory was prescribed she has yet to pick it up. Pain is worse with activity but also present at rest. She injured the same shoulder several years ago and had good symptom relief with a cortisone injection. No prior shoulder surgery. She's getting similar pain in the left shoulder as well but not as bad. She has been taking some 800 mg ibuprofen which has helped minimally. No associated numbness or tingling.  Her medical history is unchanged    Review of Systems     Objective:   Physical Exam Well-developed, well-nourished. No acute distress  Right shoulder: Patient does demonstrate full range of motion but has a positive painful arc on the right. Negative empty can and negative Hawkins but she does have reproducible pain with resisted supraspinatus. No pain with resisted internal or external rotation. There is tenderness to palpation over the bicipital groove as well. No tenderness over the a.c. joint. Neurovascularly intact distally.       Assessment & Plan:  1. Right shoulder pain secondary to rotator cuff strain versus biceps tendinitis  Right subacromial space would be injected with cortisone today. Patient is encouraged to get her anti-inflammatory which was prescribed to her by the ER physician. She is cautioned about taking this with her ibuprofen. She is a long-standing patient of Dr. Donnetta Hail and has an appointment to followup with her in 4 weeks. She will let me know if she has any questions or concerns in the  interim.  Consent obtained and verified. Time-out conducted. Noted no overlying erythema, induration, or other signs of local infection. Skin prepped in a sterile fashion. Topical analgesic spray: Ethyl chloride. Joint: right subacromial space Needle: 25g 1.5 inch Completed without difficulty. Meds: 1cc (40mg ) depomedrol, 3cc 1% xylocaine  Advised to call if fevers/chills, erythema, induration, drainage, or persistent bleeding.

## 2013-07-15 NOTE — Addendum Note (Signed)
Addended by: Jacki Cones C on: 07/15/2013 09:12 AM   Modules accepted: Orders

## 2013-07-20 ENCOUNTER — Other Ambulatory Visit: Payer: Self-pay | Admitting: *Deleted

## 2013-07-20 DIAGNOSIS — M722 Plantar fascial fibromatosis: Secondary | ICD-10-CM

## 2013-07-20 MED ORDER — TRAMADOL HCL 50 MG PO TABS: 100.0000 mg | ORAL_TABLET | Freq: Three times a day (TID) | ORAL | Status: DC | PRN

## 2013-07-20 NOTE — Telephone Encounter (Signed)
Last filled 11/20

## 2013-07-23 ENCOUNTER — Encounter: Payer: Self-pay | Admitting: Internal Medicine

## 2013-08-10 ENCOUNTER — Ambulatory Visit (INDEPENDENT_AMBULATORY_CARE_PROVIDER_SITE_OTHER): Payer: No Typology Code available for payment source | Admitting: Internal Medicine

## 2013-08-10 ENCOUNTER — Encounter: Payer: Self-pay | Admitting: Internal Medicine

## 2013-08-10 VITALS — BP 141/69 | HR 84 | Temp 97.0°F | Ht 61.0 in | Wt 211.1 lb

## 2013-08-10 DIAGNOSIS — J069 Acute upper respiratory infection, unspecified: Secondary | ICD-10-CM

## 2013-08-10 DIAGNOSIS — J020 Streptococcal pharyngitis: Secondary | ICD-10-CM | POA: Insufficient documentation

## 2013-08-10 MED ORDER — FLUTICASONE PROPIONATE 50 MCG/ACT NA SUSP: 1.0000 | Freq: Every day | NASAL | Status: DC

## 2013-08-10 MED ORDER — CEFTRIAXONE SODIUM 1 G IJ SOLR
1.0000 g | Freq: Once | INTRAMUSCULAR | Status: AC
  Administered 2013-08-10: 1 g via INTRAMUSCULAR

## 2013-08-10 MED ORDER — GUAIFENESIN-DM 100-10 MG/5ML PO SYRP: 5.0000 mL | ORAL_SOLUTION | ORAL | Status: DC | PRN

## 2013-08-10 NOTE — Assessment & Plan Note (Signed)
Pt with recent sick contacts but no findings on PE to suggest PNA or bronchitis.  -symptomatic management with flonase, tussin DM cough syrup, normal saline nasal spray

## 2013-08-10 NOTE — Assessment & Plan Note (Signed)
Pt meets 3/4 Centor criteria making strep pharyngitis at least 32%. Discussion regarding the recommendation to do a throat culture and then treat if positive was extensively had with the patient. 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  -IM ceftriaxone given in clinic -symptomatic management as per URI

## 2013-08-10 NOTE — Progress Notes (Signed)
   Subjective:    Patient ID: [name], female    DOB: 14-May-1961, 53 y.o.   MRN: [id number]  HPI [name] is a 53 yo woman pmh as listed below presents for sore throat.   Pt states that symptoms have been going on for 3 days that included sore throat, fever, chills, no myalgias or arthralgias, painful swallowing, then ear fullness, PND and then cough. She has had sick contacts including her son and sister whom lives in a nursing home. Pt has had decreased PO intake since this time and feels her sore throat improves with drinking cold fluids. She has tried many OTC meds including mucinex, dayquil and nyquil, cough drops, and tramadol with little relief. 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 Since the onset of symptoms she has also been remarkably fatigued and sleeping a lot.   Review of Systems  Constitutional: Positive for fever (night sweats), appetite change (decreased and limited by pain) and fatigue. Negative for activity change.  HENT: Positive for congestion, postnasal drip, rhinorrhea, sore throat, trouble swallowing and voice change (strained and painful at times). Negative for ear pain (but feelings of fullness), sinus pressure, sneezing and tinnitus.   Eyes: Negative for discharge and visual disturbance.  Respiratory: Positive for cough. Negative for shortness of breath.   Cardiovascular: Negative for chest pain, palpitations and leg swelling.  Gastrointestinal: Negative for nausea, vomiting, abdominal pain, diarrhea and constipation.  Musculoskeletal: Negative for arthralgias and myalgias.  Skin: Negative for rash.  Neurological: Negative for dizziness and headaches.   Social, surgical, family history reviewed with patient and updated in appropriate chart locations.     Objective:   Physical Exam Filed Vitals:   08/10/13 1337  BP: 141/69  Pulse: 84  Temp: 97 F (36.1 C)   General:  sitting in chair, NAD HEENT: PERRL, EOMI, no scleral icterus, bulging TM with clear fluid, no pus or erythema, clear PND, erythematous oropharynx with slight questionable exudate on right tonsil, tender anterior and submadibular LAD Cardiac: RRR, no rubs, murmurs or gallops Pulm: clear to auscultation bilaterally, no crackles, wheezes or rhonchi, moving normal volumes of air Abd: soft, nontender, nondistended, BS present Ext: warm and well perfused, no pedal edema Neuro: alert and oriented X3, cranial nerves II-XII grossly intact    Assessment & Plan:  Please see problem oriented charting  Pt discussed with Dr. Daryll Drown

## 2013-08-10 NOTE — Patient Instructions (Signed)
Strep Throat  Strep throat is an infection of the throat. It is caused by a germ. Strep throat spreads from person to person by coughing, sneezing, or close contact.  HOME CARE  · Rinse your mouth (gargle) with warm salt water (1 teaspoon salt in 1 cup of water). Do this 3 to 4 times per day or as needed for comfort.  · Family members with a sore throat or fever should see a doctor.  · Make sure everyone in your house washes their hands well.  · Do not share food, drinking cups, or personal items.  · Eat soft foods until your sore throat gets better.  · Drink enough water and fluids to keep your pee (urine) clear or pale yellow.  · Rest.  · Stay home from school, daycare, or work until you have taken medicine for 24 hours.  · Only take medicine as told by your doctor.  · Take your medicine as told. Finish it even if you start to feel better.  GET HELP RIGHT AWAY IF:   · You have new problems, such as throwing up (vomiting) or bad headaches.  · You have a stiff or painful neck, chest pain, trouble breathing, or trouble swallowing.  · You have very bad throat pain, drooling, or changes in your voice.  · Your neck puffs up (swells) or gets red and tender.  · You have a fever.  · You are very tired, your mouth is dry, or you are peeing less than normal.  · You cannot wake up completely.  · You get a rash, cough, or earache.  · You have green, yellow-brown, or bloody spit.  · Your pain does not get better with medicine.  MAKE SURE YOU:   · Understand these instructions.  · Will watch your condition.  · Will get help right away if you are not doing well or get worse.  Document Released: 12/19/2007 Document Revised: 09/24/2011 Document Reviewed: 08/31/2010  [employer]® Patient Information ©2014 [employer].

## 2013-08-12 NOTE — Progress Notes (Signed)
Case discussed with Dr. Sadek soon after the resident saw the patient.  We reviewed the resident's history and exam and pertinent patient test results.  I agree with the assessment, diagnosis, and plan of care documented in the resident's note. 

## 2013-08-13 ENCOUNTER — Encounter: Payer: Self-pay | Admitting: Internal Medicine

## 2013-08-13 NOTE — Progress Notes (Deleted)
Patient: [name]   MRN: [id number]  DOB: [birth year]  PCP: Charlann Lange, MD   Subjective:    CC: No chief complaint on file.   HPI: [name] is a 53 y.o. woman with PMH of uncontrolled DM, OSA, GERD, chronic left hand pain followed by Dr. Nori Riis since 02/06/13, hx of B/L knee meniscus tears and right shoulder tendinopathy, who present to the clinic for follow up.   She has a lot of life stressors. Her husband is paraplegia and her son is quadriplegia. 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 She states that she forgets to take her insulin or metformin due to her busy schedule. She admits dietary discretion.   1) Uncontrolled DM     Patient reports feeling fine and denies thirsty, polydipsia and polyuria. Denies blurry vision or numbness/tingling on hands/feets. She reports more compliance with her diet and medical treatment. Patient states that she has been taking her metformin,  lantus and Victoza consistently.    2) chronic left hand pain, Tenosynovitis?      Patient has had a chronic hand pain and was referred to sports medicine Dr. Nori Riis who started to see her since 02/06/13. 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  Patient was started on Prednisone therapy since 02/06/13. She is currently on tapering dose per Dr. Nori Riis. Per Dr. Verlon Au note on 04/10/13. She recs either low dose prednisone and/or Rheumatology referral ( which will be difficult since she does not have insurance).      Health maintenance 1. Mammogram- referred ( two sisters have breast cancer).  2. Colonscopy-referred  3. Pap smear--deferred 4. Ophthalmology exam--retina camera ordered   Review of Systems: Review of Systems:  Constitutional:  Denies fever, chills, diaphoresis, appetite change and fatigue.   HEENT:  Denies congestion, sore throat, rhinorrhea, sneezing, mouth sores, trouble swallowing, neck pain   Respiratory:  Denies SOB,  DOE, cough, and wheezing.   Cardiovascular:  Denies palpitations and leg swelling.   Gastrointestinal:  Denies nausea, vomiting, abdominal pain, diarrhea, constipation, blood in stool and abdominal distention.   Genitourinary:  Denies dysuria, urgency, frequency, hematuria, flank pain and difficulty urinating.   Musculoskeletal:  Denies myalgias, back pain, joint swelling, arthralgias and gait problem. Left hand chronic swelling and pain  Skin:  Denies pallor, rash and wound.   Neurological:  Denies dizziness, seizures, syncope, weakness, light-headedness, numbness and headaches.    .     Current Outpatient Medications: Current Outpatient Prescriptions  Medication Sig Dispense Refill  . Canagliflozin (INVOKANA) 100 MG TABS Take 1 tablet (100 mg total) by mouth daily before breakfast.  30 tablet  0  . fluticasone (FLONASE) 50 MCG/ACT nasal spray Place 1 spray into both nostrils daily.  16 g  2  . guaiFENesin-dextromethorphan (ROBITUSSIN DM) 100-10 MG/5ML syrup Take 5 mLs by mouth every 4 (four) hours as needed for cough.  118 mL  0  . HYDROcodone-acetaminophen (NORCO) 10-325 MG per tablet Take 1 tablet by mouth every 6 (six) hours as needed.  80 tablet  0  . insulin glargine (LANTUS) 100 UNIT/ML injection Inject 15 units at bedtime  10 mL    . Liraglutide (VICTOZA [place]) Inject 1.8 mg into the skin daily.      Marland Kitchen LIRAGLUTIDE [place] Inject 1.8 mg into the skin daily.      . metFORMIN (GLUCOPHAGE) 500 MG tablet Take 1 tablet (500 mg total) by mouth 2 (  two) times daily with a meal.  60 tablet  11  . naproxen (NAPROSYN) 500 MG tablet Take 1 tablet (500 mg total) by mouth 2 (two) times daily.  30 tablet  0  . omeprazole (PRILOSEC) 20 MG capsule Take 1 capsule (20 mg total) by mouth daily.  30 capsule  11  . pravastatin (PRAVACHOL) 40 MG tablet Take 1 tablet (40 mg total) by mouth daily.  30 tablet  11  . predniSONE (DELTASONE) 20 MG tablet Patient has written taper  30 tablet  2  . traMADol (ULTRAM) 50  MG tablet Take 2 tablets (100 mg total) by mouth every 8 (eight) hours as needed.  180 tablet  0  . traMADol (ULTRAM) 50 MG tablet Take 2 tablets (100 mg total) by mouth every 8 (eight) hours as needed.  180 tablet  0  . traMADol (ULTRAM) 50 MG tablet Take 2 tablets (100 mg total) by mouth every 8 (eight) hours as needed.  180 tablet  0   No current facility-administered medications for this visit.    Allergies: Allergies  Allergen Reactions  . Ofloxacin     REACTION: rash, throat itching, tightness    Past Medical History  Diagnosis Date  . Adhesive capsulitis of right shoulder     with underlying tendinopathy rotator cuff  . History of post-sterilization tuboplasty 2000  . Tear of meniscus of left knee     x2  . Tear of meniscus of right knee   . Plantar fasciitis     Right  . Shortness of breath     with exertion  . Diabetes mellitus     oral tx  . GERD (gastroesophageal reflux disease)   . Arthritis   . Sleep apnea 5 plus yrs    study -pt could not sleep test inconclusive.     Objective:    Physical Exam: There were no vitals filed for this visit.   General: Vital signs reviewed and noted. Well-developed, well-nourished, in no acute distress; alert, appropriate and cooperative throughout examination.  Head: Normocephalic, atraumatic.  Lungs:  Normal respiratory effort. Clear to auscultation BL without crackles or wheezes.  Heart: RRR. S1 and S2 normal without gallop, rubs, murmur.  Abdomen:  BS normoactive. Soft, Nondistended, non-tender.  No masses or organomegaly.  Extremities: No pretibial edema. chronic left hand pain and mild swelling followed up by her sports medicine. No erythema, tenderness, or warmth to touch    Assessment/ Plan:

## 2013-08-13 NOTE — Progress Notes (Signed)
This encounter was created in error - please disregard.

## 2013-08-17 ENCOUNTER — Ambulatory Visit: Payer: No Typology Code available for payment source | Admitting: Family Medicine

## 2013-08-21 ENCOUNTER — Encounter: Payer: Self-pay | Admitting: Family Medicine

## 2013-08-21 ENCOUNTER — Ambulatory Visit (INDEPENDENT_AMBULATORY_CARE_PROVIDER_SITE_OTHER): Payer: No Typology Code available for payment source | Admitting: Family Medicine

## 2013-08-21 VITALS — BP 111/74 | HR 88 | Ht 61.0 in | Wt 211.0 lb

## 2013-08-21 DIAGNOSIS — M75101 Unspecified rotator cuff tear or rupture of right shoulder, not specified as traumatic: Secondary | ICD-10-CM

## 2013-08-21 DIAGNOSIS — M67919 Unspecified disorder of synovium and tendon, unspecified shoulder: Secondary | ICD-10-CM

## 2013-08-21 DIAGNOSIS — M719 Bursopathy, unspecified: Secondary | ICD-10-CM

## 2013-08-21 MED ORDER — NITROGLYCERIN 0.2 MG/HR TD PT24: 0.2000 mg | MEDICATED_PATCH | Freq: Every day | TRANSDERMAL | Status: DC

## 2013-08-21 NOTE — Patient Instructions (Addendum)
Thank you for coming in today  1. Stay off prednisone if we can. Glad you are doing well. Call if your hands get worse again  2. For your shoulder - Continue to do your exercises. Try to remember to do them almost every day - Try nitroglycerin patch. This can be very helpful with rotator cuff tendonitis  Nitroglycerin Protocol   Apply 1/4 nitroglycerin patch to affected area daily.  Change position of patch within the affected area every 24 hours.  You may experience a headache during the first 1-2 weeks of using the patch, these should subside.  If you experience headaches after beginning nitroglycerin patch treatment, you may take your preferred over the counter pain reliever.  Another side effect of the nitroglycerin patch is skin irritation or rash related to patch adhesive.  Please notify our office if you develop more severe headaches or rash, and stop the patch.  Tendon healing with nitroglycerin patch may require 12 to 24 weeks depending on the extent of injury.  Men should not use if taking Viagra, Cialis, or Levitra.   Do not use if you have migraines or rosacea.

## 2013-08-21 NOTE — Progress Notes (Signed)
CC: Followup right shoulder pain HPI: Patient is a very pleasant 53 year old female with past medical history of plantar fascial fibromatosis and severe Dupuytrens contractures of the hand who presents for followup. She has been off of prednisone for the last 2-3 weeks and has not had any worsening of her hands. She is currently quite pleased with how she is doing with respect her hands. Unfortunately she has been experiencing some increased right shoulder discomfort over the last couple months. She saw Dr. Micheline Chapman here who performed a subacromial injection and gave her some home exercises. She states that she is at least 50% better with the injection but still has difficulty working as a Programmer, systems. It does bother her to do day-to-day activities still. Pain is mostly over the anterolateral shoulder and is worse with any movement. She admits that she is only doing her exercises intermittently  ROS: As above in the HPI. All other systems are stable or negative.  OBJECTIVE: APPEARANCE:  Patient in no acute distress.The patient appeared well nourished and normally developed. HEENT: No scleral icterus. Conjunctiva non-injected Resp: Non labored Skin: No rash MSK:  Right Shoulder - No swelling or deformity - TTP over lateral humerus - FROM in flexion, abduction, internal, external rotation - Strength 5/5 on shoulder abduction, internal rotation, external rotation, empty can. She has pain on resisted belly press and empty can - Painful empty can, Hawkin's, Neer's for impingement - Neurovascularly intact   MSK [name]: Not performed   ASSESSMENT: #1. Right rotator cuff syndrome   PLAN: Reemphasized home exercise plan and importance of doing this on a consistent basis. I am glad to see that she is doing some better after the injection. We'll start her on a nitroglycerin patch for treatment of rotator cuff tendinopathy. We will see her back in one month for recheck.

## 2013-08-26 ENCOUNTER — Telehealth: Payer: Self-pay | Admitting: *Deleted

## 2013-08-26 NOTE — Telephone Encounter (Signed)
Hand is not swelling (as much) but feels very tight and difficult to close hand or grab. Wants to know if she should start the prednisone back up

## 2013-08-28 ENCOUNTER — Ambulatory Visit: Payer: No Typology Code available for payment source | Admitting: Family Medicine

## 2013-09-09 ENCOUNTER — Other Ambulatory Visit: Payer: Self-pay | Admitting: *Deleted

## 2013-09-09 DIAGNOSIS — M549 Dorsalgia, unspecified: Secondary | ICD-10-CM

## 2013-09-09 DIAGNOSIS — M722 Plantar fascial fibromatosis: Secondary | ICD-10-CM

## 2013-09-09 DIAGNOSIS — G8929 Other chronic pain: Secondary | ICD-10-CM

## 2013-09-09 MED ORDER — TRAMADOL HCL 50 MG PO TABS: 100.0000 mg | ORAL_TABLET | Freq: Three times a day (TID) | ORAL | Status: DC | PRN

## 2013-09-09 NOTE — Telephone Encounter (Signed)
Called to pharm 

## 2013-09-11 ENCOUNTER — Other Ambulatory Visit: Payer: Self-pay | Admitting: *Deleted

## 2013-09-11 ENCOUNTER — Other Ambulatory Visit: Payer: Self-pay | Admitting: Internal Medicine

## 2013-09-11 DIAGNOSIS — G8929 Other chronic pain: Secondary | ICD-10-CM

## 2013-09-11 DIAGNOSIS — M549 Dorsalgia, unspecified: Principal | ICD-10-CM

## 2013-09-11 MED ORDER — HYDROCODONE-ACETAMINOPHEN 10-325 MG PO TABS: 1.0000 | ORAL_TABLET | Freq: Two times a day (BID) | ORAL | Status: DC | PRN

## 2013-09-11 NOTE — Telephone Encounter (Signed)
Last filled Hydrocodone 2/6 #80 tabs which is 20 day supply per [facility]  [place],. Pt states she's due for a refill on the 1st.

## 2013-09-11 NOTE — Telephone Encounter (Signed)
Informed pt next refill will be on the 6th. She stated the bottle has the 1st on it. But she voiced understanding.

## 2013-09-15 ENCOUNTER — Encounter: Payer: Self-pay | Admitting: Internal Medicine

## 2013-09-15 ENCOUNTER — Ambulatory Visit (INDEPENDENT_AMBULATORY_CARE_PROVIDER_SITE_OTHER): Payer: No Typology Code available for payment source | Admitting: Internal Medicine

## 2013-09-15 VITALS — BP 117/78 | HR 86 | Temp 97.3°F | Ht 61.0 in | Wt 210.3 lb

## 2013-09-15 DIAGNOSIS — IMO0001 Reserved for inherently not codable concepts without codable children: Secondary | ICD-10-CM

## 2013-09-15 DIAGNOSIS — E119 Type 2 diabetes mellitus without complications: Secondary | ICD-10-CM

## 2013-09-15 DIAGNOSIS — E1165 Type 2 diabetes mellitus with hyperglycemia: Secondary | ICD-10-CM

## 2013-09-15 DIAGNOSIS — Z0000 Encounter for general adult medical examination without abnormal findings: Secondary | ICD-10-CM

## 2013-09-15 DIAGNOSIS — M7500 Adhesive capsulitis of unspecified shoulder: Secondary | ICD-10-CM

## 2013-09-15 DIAGNOSIS — K047 Periapical abscess without sinus: Secondary | ICD-10-CM

## 2013-09-15 LAB — POCT GLYCOSYLATED HEMOGLOBIN (HGB A1C): Hemoglobin A1C: 9.3

## 2013-09-15 LAB — HM DIABETES EYE EXAM

## 2013-09-15 LAB — GLUCOSE, CAPILLARY: Glucose-Capillary: 139 mg/dL — ABNORMAL HIGH (ref 70–99)

## 2013-09-15 MED ORDER — AMOXICILLIN 500 MG PO TABS: 500.0000 mg | ORAL_TABLET | Freq: Two times a day (BID) | ORAL | Status: DC

## 2013-09-15 MED ORDER — AGAMATRIX PRESTO W/DEVICE KIT: PACK | Status: DC

## 2013-09-15 NOTE — Progress Notes (Signed)
Case discussed with Dr. Kollar soon after the resident saw the patient.  We reviewed the resident's history and exam and pertinent patient test results.  I agree with the assessment, diagnosis, and plan of care documented in the resident's note. 

## 2013-09-15 NOTE — Patient Instructions (Signed)
We will give you a referral for dentist and amoxicillin for your infection to calm it down.   We have sent in a new meter to the health department. This will help you monitor your sugars.   Probably the next best option would be to start insulin before meals to help bring down your sugars and I want you to think about starting this when you come back to see your doctor. Invokana costs about $400 per month out of pocket.   Come back in 1-2 months with your doctor. Call us with problems or questions before then at [phone].  Adhesive Capsulitis Sometimes the shoulder becomes stiff and is painful to move. Some people say it feels as if the shoulder is frozen in place. Because of this, the condition is called "frozen shoulder." Its medical name is adhesive capsulitis.  The shoulder joint is made up of strong connective tissue that attaches the ball of the humerus to the shallow shoulder socket. This strong connective tissue is called the joint capsule. This tissue can become stiff and swollen. That is when adhesive capsulitis sets in. CAUSES  It is not always clear just what the cause adhesive capsulitis. Possibilities include:  Injury to the shoulder joint.  Strain. This is a repetitive injury brought about by overuse.  Lack of use. Perhaps your arm or hand was otherwise injured. It might have been in a sling for awhile. Or perhaps you were not using it to avoid pain.  Referred pain. This is a sort of trick the body plays. You feel pain in the shoulder. But, the pain actually comes from an injury somewhere else in the body.  Long-standing health problems. Several diseases can cause adhesive capsulitis. They include diabetes, heart disease, stroke, thyroid problems, rheumatoid arthritis and lung disease.  Being a women older than 31. Anyone can develop adhesive capsulitis but it is most common in women in this age group. SYMPTOMS   Pain.  It occurs when the arm is moved.  Parts of the  shoulder might hurt if they are touched.  Pain is worse at night or when resting.  Soreness. It might not be strong enough to be called pain. But, the shoulder aches.  The shoulder does not move freely.  Muscle spasms.  Trouble sleeping because of shoulder ache or pain. DIAGNOSIS  To decide if you have adhesive capsulitis, your healthcare provider will probably:  Ask about symptoms you have noticed.  Ask about your history of joint pain and anything that might have caused the pain.  Ask about your overall health.  Use hands to feel your shoulder and neck.  Ask you to move your shoulder in specific directions. This may indicate the origin of the pain.  Order imaging tests; pictures of the shoulder. They help pinpoint the source of the problem. An X-ray might be used. For more detail, an MRI is often used. An MRI details the tendons, muscles and ligaments as well as the joint. TREATMENT  Adhesive capsulitis can be treated several ways. Most treatments can be done in a clinic or in your healthcare provider's office. Be sure to discuss the different options with your caregiver. They include:  Physical therapy. You will work on specific exercises to get your shoulder moving again. The exercises usually involve stretching. A physical therapist (a caregiver with special training) can show you what to do and what not to do. The exercises will need to be done daily.  Medication.  Over-the-counter medicines may relieve pain  and inflammation (the body's way of reacting to injury or infection).  Corticosteroids. These are stronger drugs to reduce pain and inflammation. They are given by injection (shots) into the shoulder joint. Frequent treatment is not recommended.  Muscle relaxants. Medication may be prescribed to ease muscle spasms.  Treatment of underlying conditions. This means treating another condition that is causing your shoulder problem. This might be a rotator cuff (tendon)  problem  Shoulder manipulation. The shoulder will be moved by your healthcare provider. You would be under general anesthesia (given a drug that puts you to sleep). You would not feel anything. Sometimes the joint will be injected with salt water (saline) at high pressure to break down internal scarring in the joint capsule.  Surgery. This is rarely needed. It may be suggested in advanced cases after all other treatment has failed. PROGNOSIS  In time, most people recover from adhesive capsulitis. Sometimes, however, the pain goes away but full movement of the shoulder does not return.  HOME CARE INSTRUCTIONS   Take any pain medications recommended by your healthcare provider. Follow the directions carefully.  If you have physical therapy, follow through with the therapist's suggestions. Be sure you understand the exercises you will be doing. You should understand:  How often the exercises should be done.  How many times each exercise should be repeated.  How long they should be done.  What other activities you should do, or not do.  That you should warm up before doing any exercise. Just 5 to 10 minutes will help. Small, gentle movements should get your shoulder ready for more.  Avoid high-demand exercise that involves your shoulder such as throwing. This type of exercise can make pain worse.  Consider using cold packs. Cold may ease swelling and pain. Ask your healthcare provider if a cold pack might help you. If so, get directions on how and when to use them. SEEK MEDICAL CARE IF:   You have any questions about your medications.  Your pain continues to increase. Document Released: 04/29/2009 Document Revised: 09/24/2011 Document Reviewed: 04/29/2009 [facility] Patient Information 2014 [place], [place].

## 2013-09-15 NOTE — Progress Notes (Signed)
Subjective:     Patient ID: [name], female   DOB: [birth year], 53 y.o.   MRN: [id number]  HPI The patient is a 53 YO female who comes in today for an acute visit for arm numbness. She notices that when she is moving her shoulder a lot or doing hand motions (playing a game) she notices the numbness. She does not have paralysis of the limb. She is able to move her shoulder and the sensation alleviates. She noticed that after her steroid shot in her shoulder it got better for awhile but has returned. She tries to do exercises but they hurt and this limits her compliance. She is physically active taking care of her two handicapped relatives (husband and son) at home. She lifts them both to transfer regularly. She also has PMH of adhesive capsulitis on her right shoulder, plantar fasciitis, dupuytren's contracture right hand with tenosynovitis, DM type 2, and fibromyalgia. She will follow up with sports medicine later this week. She is having an infection of her front teeth on top that started 1-2 days ago. She is not having fevers but pain and warmth and swelling in the gumline. She is not as able to eat well since that time. She is unable to take metformin and had massive (30#) weight gain on glipizide and does not wish to retry either of those options.   Review of Systems  Constitutional: Negative for fever, chills, diaphoresis, activity change, appetite change, fatigue and unexpected weight change.  HENT: Positive for dental problem. Negative for drooling, sore throat and trouble swallowing.   Respiratory: Negative for cough, chest tightness, shortness of breath and wheezing.   Cardiovascular: Negative for chest pain, palpitations and leg swelling.  Gastrointestinal: Negative for nausea, vomiting, abdominal pain, diarrhea and constipation.  Endocrine: Negative for polyuria.  Musculoskeletal: Positive for arthralgias. Negative for neck pain and neck stiffness.  Neurological: Negative for  dizziness, tremors, seizures, syncope, facial asymmetry, speech difficulty, weakness, light-headedness, numbness and headaches.       Objective:   Physical Exam  Constitutional: She is oriented to person, place, and time. She appears well-developed and well-nourished.  Obese  HENT:  Head: Normocephalic and atraumatic.  Front tooth on top with infection and swelling in the gumline. No deep abscess or fluid pocket but redness and warmth. Also broken tooth in the back on left.   Cardiovascular: Normal rate and regular rhythm.   No murmur heard. Pulmonary/Chest: Effort normal and breath sounds normal. No respiratory distress. She has no wheezes. She has no rales.  Abdominal: Soft. Bowel sounds are normal. She exhibits no distension. There is no tenderness. There is no rebound and no guarding.  Musculoskeletal: Normal range of motion. She exhibits tenderness.  Right shoulder with tenderness along the pec muscle. No tenderness along the clavicle or over the [facility] joint. Reasonably intact range of motion of the right shoulder and some tenderness on ROM.   Neurological: She is alert and oriented to person, place, and time.  Skin: Skin is warm and dry.       Assessment/Plan:   1. Dental infection - Amoxicillin 500 mg BID for 7 days to help calm the infection and referral to dentistry clinic.   2. Please see problem oriented charting.   3. Disposition - Foot exam done today and HgA1c checked and retinal camera done. Advised her of novolog and will have her call back if she is ready to start otherwise follow up in 1-2 months with PCP to hopefully initiate  novolog with meals. Referral to dental clinic and amoxicillin for dental infection 500 mg BID for 7 days. Recommended strongly getting mammogram due to family history and she made apt for this Friday while still in clinic.

## 2013-09-15 NOTE — Assessment & Plan Note (Signed)
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 She made apt for 09/18/13 and advised her to keep this apt.

## 2013-09-15 NOTE — Assessment & Plan Note (Signed)
Likely some nerve impingement is causing these symptoms. Advised continued stretching and discussed the importance of mobility for the arm.

## 2013-09-15 NOTE — Assessment & Plan Note (Addendum)
HgA1c checked and increased from previous. Discussed with her briefly that novolog will likely be her best option to increase her level of control and she will think about it. If she is ready to start prior to next visit she will call us. If not, will advise for it to be re-addressed at her next clinic visit and strong push to start novolog. If too many injections can consider stopping victoza. Foot exam done and retinal camera picture taken at today's visit.

## 2013-09-18 ENCOUNTER — Ambulatory Visit (HOSPITAL_COMMUNITY): Payer: No Typology Code available for payment source

## 2013-09-18 ENCOUNTER — Ambulatory Visit: Payer: No Typology Code available for payment source | Admitting: Family Medicine

## 2013-09-25 ENCOUNTER — Ambulatory Visit (HOSPITAL_COMMUNITY): Payer: No Typology Code available for payment source

## 2013-09-25 ENCOUNTER — Ambulatory Visit: Payer: No Typology Code available for payment source | Admitting: Family Medicine

## 2013-09-28 ENCOUNTER — Encounter: Payer: Self-pay | Admitting: Internal Medicine

## 2013-10-02 ENCOUNTER — Ambulatory Visit (INDEPENDENT_AMBULATORY_CARE_PROVIDER_SITE_OTHER): Payer: No Typology Code available for payment source | Admitting: Family Medicine

## 2013-10-02 ENCOUNTER — Encounter: Payer: Self-pay | Admitting: Family Medicine

## 2013-10-02 VITALS — BP 128/85 | HR 66 | Ht 61.0 in | Wt 210.0 lb

## 2013-10-02 DIAGNOSIS — M25519 Pain in unspecified shoulder: Secondary | ICD-10-CM

## 2013-10-02 DIAGNOSIS — M25511 Pain in right shoulder: Secondary | ICD-10-CM

## 2013-10-02 MED ORDER — METHYLPREDNISOLONE ACETATE 40 MG/ML IJ SUSP
40.0000 mg | Freq: Once | INTRAMUSCULAR | Status: AC
  Administered 2013-10-02: 40 mg via INTRA_ARTICULAR

## 2013-10-02 NOTE — Progress Notes (Signed)
CC: Followup right shoulder pain HPI: Patient returns for followup of right shoulder pain. The subacromial injection that she got late December did help her. The pain in her posterior shoulders completely gone. She is still complaining of pain in her anterior shoulder. She has been doing home exercises. She had to stop the nitroglycerin due to headaches. Her pain is worst if she extends the arm out to the side. She has trouble reaching and has had to decrease her bowling ball weight. She did mention that she got some tingling in her right arm for a couple weeks it is now resolving. This is on her inner upper arm, volar forearm, and into the radial 3 fingers. She denies any radicular pain or any weakness.  ROS: As above in the HPI. All other systems are stable or negative.  OBJECTIVE: APPEARANCE:  Patient in no acute distress.The patient appeared well nourished and normally developed. HEENT: No scleral icterus. Conjunctiva non-injected Resp: Non labored Skin: No rash MSK:  Neck: Negative spurling's Full neck range of motion Grip strength and sensation normal in bilateral hands Strength good C4 to T1 distribution No sensory change to C4 to T1  Right Shoulder - No swelling or deformity - No TTP over [facility] joint or lateral humerus. Tender over the anterior shoulder in the biceps tendon and subscapularis - FROM in flexion, abduction, internal, external rotation - Strength 5/5 on shoulder abduction, internal rotation, external rotation, empty can. She has pain with belly press and liftoff with normal strength - Mild pain with empty can - Positive O'Briens and speeds - Neurovascularly intact  MSK [name]: Limited ultrasound of the right shoulder was performed today in transverse and longitudinal views. Biceps tendon located without hypoechoic change or obvious tear. It does appear more medial than the normal position. Subscapularis tendon also visualized in longitudinal view. Normal tendon fibers  without evidence of tear. There is no subscapularis impingement.   ASSESSMENT: #1. Persistent right shoulder pain, favor subscapularis tendinopathy versus labral tear   PLAN: Discuss treatment options with the patient. She will continue her home exercise plan. We did recommend repeat injection today with subacromial injection and glenohumeral joint injection. Patient is in agreement with this plan. We will see her back in about 3 weeks. We will build her new custom orthotics at that time if she has plantar fibromatosis and has worn out her current orthotics.   Consent obtained and verified.  Time-out conducted.  Noted no overlying erythema, induration, or other signs of local infection.  Skin prepped in a sterile fashion.  Topical analgesic spray: Ethyl chloride.  Joint: Right subacromial Needle: 22-gauge 1-1/2 inch Completed without difficulty.  Meds: 4 cc 1% lidocaine, 1 cc depomedrol Advised to call if fevers/chills, erythema, induration, drainage, or persistent bleeding.  Consent obtained and verified.  Time-out conducted.  Noted no overlying erythema, induration, or other signs of local infection.  Skin prepped in a sterile fashion.  Topical analgesic spray: Ethyl chloride.  Joint: Right glenohumeral joint under ultrasound guidance. Posterior glenohumeral joint visualized with ultrasound and then needle guided by ultrasound into the joint Needle: Spinal needle Completed without difficulty.  Meds: 4 cc 1% lidocaine, 1 cc depomedrol Advised to call if fevers/chills, erythema, induration, drainage, or persistent bleeding.

## 2013-10-05 ENCOUNTER — Other Ambulatory Visit: Payer: Self-pay | Admitting: *Deleted

## 2013-10-05 DIAGNOSIS — E119 Type 2 diabetes mellitus without complications: Secondary | ICD-10-CM

## 2013-10-06 MED ORDER — INSULIN GLARGINE 100 UNIT/ML ~~LOC~~ SOLN: SUBCUTANEOUS | Status: DC

## 2013-10-20 ENCOUNTER — Other Ambulatory Visit: Payer: Self-pay | Admitting: *Deleted

## 2013-10-20 DIAGNOSIS — M722 Plantar fascial fibromatosis: Secondary | ICD-10-CM

## 2013-10-20 MED ORDER — TRAMADOL HCL 50 MG PO TABS: 100.0000 mg | ORAL_TABLET | Freq: Three times a day (TID) | ORAL | Status: DC | PRN

## 2013-10-21 NOTE — Telephone Encounter (Signed)
Rx called in 

## 2013-10-26 ENCOUNTER — Ambulatory Visit: Payer: No Typology Code available for payment source | Admitting: Family Medicine

## 2013-11-02 ENCOUNTER — Ambulatory Visit: Payer: No Typology Code available for payment source | Admitting: Family Medicine

## 2013-11-06 ENCOUNTER — Ambulatory Visit (INDEPENDENT_AMBULATORY_CARE_PROVIDER_SITE_OTHER): Payer: No Typology Code available for payment source | Admitting: Family Medicine

## 2013-11-06 ENCOUNTER — Encounter: Payer: Self-pay | Admitting: Family Medicine

## 2013-11-06 VITALS — BP 107/66 | Ht 61.0 in | Wt 210.0 lb

## 2013-11-06 DIAGNOSIS — M545 Low back pain, unspecified: Secondary | ICD-10-CM

## 2013-11-06 DIAGNOSIS — M722 Plantar fascial fibromatosis: Secondary | ICD-10-CM

## 2013-11-06 NOTE — Progress Notes (Signed)
CC: Followup shoulder pain, new orthotics HPI: Patient presents for followup of shoulder pain. We were unsure whether she had rotator cuff syndrome or labral tear. Therefore last appointment she received a subacromial and glenohumeral joint injection. She states that her shoulder is doing much better at this point. She is functioning without difficulty.  She has a history of plantar fibromatosis and has worn out her orthotics. She presents for new orthotics today. She notes that her feet bother her now her low back seems to be acting up.  ROS: As above in the HPI. All other systems are stable or negative.  OBJECTIVE: APPEARANCE:  Patient in no acute distress.The patient appeared well nourished and normally developed. HEENT: No scleral icterus. Conjunctiva non-injected Resp: Non labored Skin: No rash MSK: Bilateral feet: Plantar fibromatosis present   MSK [name]: Not performed   ASSESSMENT: #1. Right shoulder pain, resolving, unclear if rotator cuff versus intra-articular #2. Chronic plantar fibromatosis and palmar fibromatosis #3. New orthotics today  PLAN: We are glad to see that her shoulder is doing so much better. She was encouraged to continue her rotator cuff exercises. For her low back, we did give her home exercise plan to work on.  We fitted her with new custom orthotics today.  Patient was fitted for a : standard, cushioned, semi-rigid orthotic. The orthotic was heated and afterward the patient stood on the orthotic blank positioned on the orthotic stand. The patient was positioned in subtalar neutral position and 10 degrees of ankle dorsiflexion in a weight bearing stance. After completion of molding, a stable base was applied to the orthotic blank. The blank was ground to a stable position for weight bearing. Size: 7 Base: EVA Posting: None Additional orthotic padding: None  Greater than 40 min today spent for evaluation and creation of custom orthotics

## 2013-11-16 ENCOUNTER — Other Ambulatory Visit: Payer: Self-pay | Admitting: *Deleted

## 2013-11-16 DIAGNOSIS — M722 Plantar fascial fibromatosis: Secondary | ICD-10-CM

## 2013-11-16 NOTE — Telephone Encounter (Signed)
Thanks. Please let her know that I will refill her Tramadol on 11/20/13.  Thanks  Nicoletta Dress

## 2013-11-17 ENCOUNTER — Other Ambulatory Visit: Payer: Self-pay | Admitting: Internal Medicine

## 2013-11-17 DIAGNOSIS — M722 Plantar fascial fibromatosis: Secondary | ICD-10-CM

## 2013-11-17 MED ORDER — TRAMADOL HCL 50 MG PO TABS: 100.0000 mg | ORAL_TABLET | Freq: Three times a day (TID) | ORAL | Status: DC | PRN

## 2013-11-17 NOTE — Progress Notes (Signed)
Did so

## 2013-11-27 ENCOUNTER — Ambulatory Visit (INDEPENDENT_AMBULATORY_CARE_PROVIDER_SITE_OTHER): Payer: No Typology Code available for payment source | Admitting: Family Medicine

## 2013-11-27 ENCOUNTER — Encounter: Payer: Self-pay | Admitting: Family Medicine

## 2013-11-27 VITALS — BP 131/85 | Ht 61.0 in | Wt 203.0 lb

## 2013-11-27 DIAGNOSIS — M720 Palmar fascial fibromatosis [Dupuytren]: Secondary | ICD-10-CM

## 2013-11-27 NOTE — Progress Notes (Signed)
Patient ID: [name], female   DOB: 1961-06-13, 53 y.o.   MRN: [id number]  [name] - 53 y.o. female MRN [id number]  Date of birth: [birth year]    SUBJECTIVE:     #1. Thumb pain. Has an area on the IP joint that is callused but is not acting like a typical callus in that is quite sore.  This is doing on for about 2 weeks. #2. Left ring finger triggering and having some pain in her palm.  ROS:     No fever, sweats, chills, unusual weight change.  PERTINENT  PMH / PSH FH / / SH:  Past Medical, Surgical, Social, and Family History Reviewed & Updated per EMR.  Pertinent Historical Findings include:  Positive for diabetes mellitus. History of idiopathic palmar flexor tendon edema.  OBJECTIVE: BP 131/85  Ht 5\' 1"  (1.549 m)  Wt 203 lb (92.08 kg)  BMI 38.38 kg/m2  Physical Exam:  Vital signs are reviewed. GENERAL: Well-developed female no acute distress HANDS: Palmar surface of the fourth flexor tendons thickened and tender with a well demarcated nodule close to the A1 pulley. The right IP joint of the thumb reveals a mildly fluctuant, raised, callused hyperkeratotic area is tender to palpation. NEURO: Sensation in her hands is otherwise intact to 2. discrimination in soft touch. ULTRASOUND: Underneath the callused area on the right IP thumb joint is a fluid-filled cyst with some debris that is evident. No foreign body is seen. The joint itself is without effusion. The fourth flexor tendon on the left hand is thickened Diffusely. PROCEDURE. Patient given informed consent, signed copy in the chart. Appropriate time out taken. Both the right IP joint and the left palmar fourth flexor tendon area were prepped and draped in usual sterile fashion. Topical ethyl chloride spray was used for local anesthesia. Ultrasound was used for guidance. Area under the right hyperkeratotic IP joint lesion was aspirated using an 18-gauge needle a small amount of thin yellow clear fluid was obtained  then 1/4 cc of methylprednisolone 40 mg per female was injected using a separate 21, 1-1/2 inch gauge needle. Patient tolerated this procedure well. Ultrasound was then used on the left palmar flexor tendon area to perform a tendon sheath injection using one half cc of 40 mg/mL methylprednisolone next is 1 cc 1% lidocaine. A 21-gauge 1-1/2 inch needle was used to inject the tendon sheath which was seen to fill nicely under ultrasound guidance. Patient's heart procedure well. There were no complications, no bleeding.  ASSESSMENT & PLAN: #1. 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 We did corticosteroid injection today #2. Fluid filled hyperkeratotic lesion on the right IP joint of the thumb. Aspiration of  small amount fluid iwith injection  small amount corticosteroid in it. We'll put a pressure bandage on it she'll followup when necessary

## 2013-12-11 ENCOUNTER — Encounter: Payer: Self-pay | Admitting: Family Medicine

## 2013-12-11 ENCOUNTER — Ambulatory Visit (INDEPENDENT_AMBULATORY_CARE_PROVIDER_SITE_OTHER): Payer: No Typology Code available for payment source | Admitting: Family Medicine

## 2013-12-11 VITALS — BP 115/80 | Ht 61.0 in | Wt 201.0 lb

## 2013-12-11 DIAGNOSIS — M79609 Pain in unspecified limb: Secondary | ICD-10-CM

## 2013-12-11 DIAGNOSIS — M2580 Other specified joint disorders, unspecified joint: Secondary | ICD-10-CM | POA: Insufficient documentation

## 2013-12-11 DIAGNOSIS — M79644 Pain in right finger(s): Secondary | ICD-10-CM

## 2013-12-11 NOTE — Progress Notes (Signed)
Patient ID: [name], female   DOB: [birth year], 53 y.o.   MRN: [id number]    [facility] [address] [place], [place] [address] Phone: [phone] Fax: [phone]   Patient Name: [name] Date of Birth: [birth year] Medical Record Number: [id number] Gender: female Date of Encounter: 12/11/2013  History of Present Illness:  [name] is a 53 y.o. very pleasant female patient who presents with the following:  Follow up of R thumb pain. She had some improvement after a needle aspiration about 2 weeks ago. The swelling returned in about 3 days and the pain in about 10 days.   This has been going on for 2 months now and she can now recall that it started after she was working with some aluminum. She doesn't remember any wounds at that time.   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    She denies any fever, erythema, or warmth over the joint. She is feeling well overall.   Patient Active Problem List   Diagnosis Date Noted  . Pain of right thumb 12/11/2013  . Chronic back pain 06/15/2013  . Tenosynovitis of hand or wrist 03/13/2013  . Irregular periods/menstrual cycles 12/02/2012  . Insomnia 12/02/2012  . Fibromyalgia 05/23/2012  . Preventative health care 03/01/2011  . HYPERLIPIDEMIA 07/27/2010  . Type II or unspecified type diabetes mellitus without mention of complication, uncontrolled 07/26/2010  . DUPUYTREN'S CONTRACTURE, RIGHT 10/19/2009  . Plantar fascial fibromatosis 09/06/2006  . ADHESIVE CAPSULITIS, RIGHT 09/04/2006  . Other tear of cartilage or meniscus of knee, current 09/04/2006   Past Medical History  Diagnosis Date  . Adhesive capsulitis of right shoulder     with underlying tendinopathy rotator cuff  . History of post-sterilization tuboplasty 2000  . Tear of meniscus of left knee     x2  . Tear of meniscus of right knee    . Plantar fasciitis     Right  . Shortness of breath     with exertion  . Diabetes mellitus     oral tx  . GERD (gastroesophageal reflux disease)   . Arthritis   . Sleep apnea 5 plus yrs    study -pt could not sleep test inconclusive.    Past Surgical History  Procedure Laterality Date  . Cholecystectomy    . Meniscus repair      R and L (L x2)  . Tonsillectomy    . Knee arthroscopy  10/10/2011    Procedure: ARTHROSCOPY KNEE;  Surgeon: Newt Minion, MD;  Location: [place];  Service: Orthopedics;  Laterality: Right;  Right Knee Arthroscopy and Excision Medial Meniscus  . Tubal ligation     History  Substance Use Topics  . Smoking status: Never Smoker   . Smokeless tobacco: Never Used  . Alcohol Use: No   Family History  Problem Relation Age of Onset  . Cancer Sister     both sisters have breast cancer   Allergies  Allergen Reactions  . Ofloxacin     REACTION: rash, throat itching, tightness    Medication list has been reviewed and updated.  Prior to Admission medications   Medication Sig Start Date End Date Taking? Authorizing Provider  Blood Glucose Monitoring Suppl (AGAMATRIX PRESTO) W/DEVICE KIT Dispense 1 kit for glucose monitoring 09/15/13   Olga Millers, MD  HYDROcodone-acetaminophen [facility]) 10-325 MG per tablet Take 1 tablet by mouth  2 (two) times daily as needed. 09/11/13   Charlann Lange, MD  insulin glargine (LANTUS) 100 UNIT/ML injection Inject 15 units at bedtime    Na Nicoletta Dress, MD  Liraglutide (VICTOZA [place]) Inject 1.8 mg into the skin daily.    Historical Provider, MD  nitroGLYCERIN (NITRODUR - DOSED IN MG/24 HR) 0.2 mg/hr patch Place 1 patch (0.2 mg total) onto the skin daily. 08/21/13   Duane Boston, MD  omeprazole (PRILOSEC) 20 MG capsule Take 1 capsule (20 mg total) by mouth daily. 06/15/13   Charlann Lange, MD  traMADol (ULTRAM) 50 MG tablet Take 2 tablets (100 mg total) by mouth every 8 (eight) hours as needed. 11/17/13   Charlann Lange, MD    Review of Systems:  Oer  HPI  Physical Examination: Filed Vitals:   12/11/13 1058  BP: 115/80   Filed Vitals:   12/11/13 1057  Height: _0  (1.549 m)  Weight: 201 lb (91.173 kg)   Body mass index is 38 kg/(m^2).  Gen: NAD, alert, cooperative with exam HEENT: NCAT Neuro: Alert and oriented, No gross deficits MSK:  R thumb with slightly fluctuant lesion over IP. No surrounding erythema, induration, or warmth. Slight tenderness to palpation.    Assessment and Plan:   Pain of right thumb Some transient improvement after needle aspiration but now pain has returned.  - MSK [name] with evidence of FB vs osteophyte, we favor osteophyte.  - Placed in a slpint, recommended ice and rest for 2 weeks and then f/u - XR to help eval for possible aluminum FB    Timmothy Euler, MD

## 2013-12-11 NOTE — Patient Instructions (Signed)
Try the thumb splint for 2 weeks, then follow up  Get the Xray at the hospital and we'll make sure there is no metal in there

## 2013-12-11 NOTE — Assessment & Plan Note (Signed)
Some transient improvement after needle aspiration but now pain has returned.  - MSK [name] with evidence of FB vs osteophyte, we favor osteophyte.  - Placed in a slpint, recommended ice and rest for 2 weeks and then f/u - XR to help eval for possible aluminum FB

## 2013-12-14 NOTE — Progress Notes (Signed)
[facility] Attending Note: I have seen and examined this patient. I have discussed this patient with the resident and reviewed the assessment and plan as documented above. I agree with the resident's findings and plan.  

## 2013-12-23 ENCOUNTER — Other Ambulatory Visit: Payer: Self-pay | Admitting: *Deleted

## 2013-12-23 DIAGNOSIS — G8929 Other chronic pain: Secondary | ICD-10-CM

## 2013-12-23 DIAGNOSIS — M549 Dorsalgia, unspecified: Principal | ICD-10-CM

## 2013-12-23 DIAGNOSIS — M722 Plantar fascial fibromatosis: Secondary | ICD-10-CM

## 2013-12-23 MED ORDER — TRAMADOL HCL 50 MG PO TABS: 100.0000 mg | ORAL_TABLET | Freq: Three times a day (TID) | ORAL | Status: DC | PRN

## 2013-12-23 MED ORDER — HYDROCODONE-ACETAMINOPHEN 10-325 MG PO TABS: 1.0000 | ORAL_TABLET | Freq: Two times a day (BID) | ORAL | Status: DC | PRN

## 2013-12-24 ENCOUNTER — Telehealth: Payer: Self-pay | Admitting: *Deleted

## 2013-12-24 NOTE — Telephone Encounter (Signed)
Call from [facility] - pt states she is taking Vicodin more often than 2 times a day; taking it 3 times a day. The pharmacist states they had to call the last several months to have the directions changed to 3 times a day. The med list still has it as 2 times a day as needed. Instructed pt to bring the Vicodin rx back to the clinic tomorrow. Pharmacy [phone].

## 2013-12-25 ENCOUNTER — Encounter: Payer: Self-pay | Admitting: Family Medicine

## 2013-12-25 ENCOUNTER — Other Ambulatory Visit: Payer: Self-pay | Admitting: Internal Medicine

## 2013-12-25 ENCOUNTER — Ambulatory Visit (INDEPENDENT_AMBULATORY_CARE_PROVIDER_SITE_OTHER): Payer: No Typology Code available for payment source | Admitting: Family Medicine

## 2013-12-25 ENCOUNTER — Ambulatory Visit (HOSPITAL_COMMUNITY)
Admission: RE | Admit: 2013-12-25 | Discharge: 2013-12-25 | Disposition: A | Discharge status: Home / Self Care | Payer: No Typology Code available for payment source | Source: Ambulatory Visit | Attending: Family Medicine | Admitting: Family Medicine

## 2013-12-25 VITALS — BP 157/81 | Ht 61.0 in | Wt 201.0 lb

## 2013-12-25 DIAGNOSIS — G8929 Other chronic pain: Secondary | ICD-10-CM

## 2013-12-25 DIAGNOSIS — M79644 Pain in right finger(s): Secondary | ICD-10-CM

## 2013-12-25 DIAGNOSIS — M79609 Pain in unspecified limb: Secondary | ICD-10-CM | POA: Insufficient documentation

## 2013-12-25 DIAGNOSIS — M549 Dorsalgia, unspecified: Principal | ICD-10-CM

## 2013-12-25 MED ORDER — HYDROCODONE-ACETAMINOPHEN 10-325 MG PO TABS: 1.0000 | ORAL_TABLET | Freq: Three times a day (TID) | ORAL | Status: DC | PRN

## 2013-12-25 NOTE — Progress Notes (Signed)
[place] Attending Note: I have seen and examined this patient. I have discussed this patient with the resident and reviewed the assessment and plan as documented above. I agree with the resident's findings and plan. After review of x-rays and seeing the prominent sesamoid bones at the IP joint of the thumb, I think this is most likely sesamoiditis. She has had some improvement with the brace. We'll continue the brace, toad splint, and she is doing heavy-duty work and then I would have her use some foam padding and tape the rest of the time. I really wonder protect this  for the next 6 weeks and n see her back. I gave her handout on sesamoiditis.

## 2013-12-25 NOTE — Assessment & Plan Note (Signed)
Previously thought to have primarily arthritic changes of the thumb with osteophyte formation favored on ultrasound. On x-ray today, patient with prominent sesamoid bone. No signs of foreign body. Suspect patient actually dealing with sesamoiditis. With protection of the thumb through splinting had 75% improvement. Will continue splinting with heavy activity and gave some padding for regular use around the house. Follow up in 6 weeks. If patient without complete resolution with protection over 3 month period, will need to consider hand surgeon referral for excision. Previously trialed needle aspiration and steroid injection without resolution of pain.

## 2013-12-25 NOTE — Patient Instructions (Signed)
Continue thumb splint with any heavy duty activity Otherwise, use padding we gave you today Follow up in 6 weeks for [name] We will have to consider hand surgeon referral if pain does not resolve with protecting it for 3 months.

## 2013-12-25 NOTE — Progress Notes (Signed)
Changed Ms. Mazon' Rx for Vicoden to TID dosing, as BID dosing and # 80 pills was a > 30 day supply and would not be filled.  Dose of medication and number of pills not changed.   Gilles Chiquito, MD

## 2013-12-25 NOTE — Progress Notes (Signed)
   Subjective:    Patient ID: [name], female    DOB: 09/03/1960, 53 y.o.   MRN: [id number]  HPI Patient presents for follow up of right thumb pain. Got x-ray this morning. She has been wearing her toad thumb splint since visit 2 weeks ago. States pain is 75% better. Took thumb out of splint last night and had some recurrence of pain with things like turning a door knob. She has had previous ultrasound concerning for osteophyte vs. Foreign body (patient thought she got aluminum near the joint). She has also had aspiration and Steroid injection of the area with little relief. 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   Past medical history- chronic back pain, fibromyalgia, Type II diabetes, HLD, dupuytren's contracture, plantar fascial fibromatosis, history of meniscal tear and adhesive capsulitis  Review of Systems Swelling has reduced. No redness at the IP joint of the thumb. No fever/chills.      Objective:   Physical Exam BP 157/81  Ht 5\' 1"  (1.549 m)  Wt 201 lb (91.173 kg)  BMI 38.00 kg/m2 Gen: pleasant, NAD MSK: Right thumb with somewhat mobile raised area over IP joint. No erythema, induration, warmth. Patient with mild pain to palpation. Neuro: Full strength of thumb and hand and no motions illicit pain other than direct palpation  Dg Finger Thumb Right  12/25/2013   CLINICAL DATA:  Swelling, pain at IP joint RIGHT thumb  EXAM: RIGHT THUMB 2+V  COMPARISON:  None  FINDINGS: Osseous mineralization normal.  Joint spaces preserved.  No fracture, dislocation, or bone destruction.  No radiopaque foreign body or soft tissue gas.  IMPRESSION: Normal exam.   Electronically Signed   By: Lavonia Dana M.D.   On: 12/25/2013 09:32   Thumb x-ray personal review: prominent sesamoid bone at IP joint of right thumb     Assessment & Plan:   Pain of right thumb Previously thought to have primarily arthritic changes of the thumb with osteophyte  formation favored on ultrasound. On x-ray today, patient with prominent sesamoid bone. No signs of foreign body. Suspect patient actually dealing with sesamoiditis. With protection of the thumb through splinting had 75% improvement. Will continue splinting with heavy activity and gave some padding for regular use around the house. Follow up in 6 weeks. If patient without complete resolution with protection over 3 month period, will need to consider hand surgeon referral for excision. Previously trialed needle aspiration and steroid injection without resolution of pain.

## 2013-12-25 NOTE — Telephone Encounter (Signed)
Pt is here - states directions needs to be changed to 1 tablet 3 times a day. Thanks

## 2013-12-25 NOTE — Telephone Encounter (Signed)
Changed Rx due to 80 pills at BID dosing is a > 30 day supply and would not be filled.   She continues to have same dose of hydrocodone/APAP and same number of pills per month.

## 2013-12-25 NOTE — Telephone Encounter (Signed)
Pt brought back previous Hydrocodone rx from yesterday which was destroyed. New rx given to pt.

## 2013-12-28 ENCOUNTER — Encounter: Payer: Self-pay | Admitting: Internal Medicine

## 2013-12-28 ENCOUNTER — Ambulatory Visit: Payer: No Typology Code available for payment source | Admitting: Internal Medicine

## 2013-12-29 ENCOUNTER — Encounter: Payer: Self-pay | Admitting: Internal Medicine

## 2013-12-29 ENCOUNTER — Ambulatory Visit: Payer: No Typology Code available for payment source

## 2013-12-29 ENCOUNTER — Ambulatory Visit (INDEPENDENT_AMBULATORY_CARE_PROVIDER_SITE_OTHER): Payer: No Typology Code available for payment source | Admitting: Internal Medicine

## 2013-12-29 ENCOUNTER — Encounter: Payer: Self-pay | Admitting: *Deleted

## 2013-12-29 VITALS — BP 139/84 | HR 80 | Temp 97.9°F | Ht 61.0 in | Wt 207.7 lb

## 2013-12-29 DIAGNOSIS — L237 Allergic contact dermatitis due to plants, except food: Secondary | ICD-10-CM

## 2013-12-29 DIAGNOSIS — E119 Type 2 diabetes mellitus without complications: Secondary | ICD-10-CM

## 2013-12-29 DIAGNOSIS — E1165 Type 2 diabetes mellitus with hyperglycemia: Secondary | ICD-10-CM

## 2013-12-29 DIAGNOSIS — L255 Unspecified contact dermatitis due to plants, except food: Secondary | ICD-10-CM

## 2013-12-29 DIAGNOSIS — IMO0001 Reserved for inherently not codable concepts without codable children: Secondary | ICD-10-CM

## 2013-12-29 LAB — GLUCOSE, CAPILLARY: Glucose-Capillary: 296 mg/dL — ABNORMAL HIGH (ref 70–99)

## 2013-12-29 LAB — POCT GLYCOSYLATED HEMOGLOBIN (HGB A1C): Hemoglobin A1C: 8.7

## 2013-12-29 MED ORDER — DIPHENHYDRAMINE HCL 50 MG/ML IJ SOLN
25.0000 mg | Freq: Once | INTRAMUSCULAR | Status: AC
  Administered 2013-12-29: 25 mg via INTRAMUSCULAR

## 2013-12-29 MED ORDER — METHYLPREDNISOLONE ACETATE 40 MG/ML IJ SUSP
20.0000 mg | Freq: Once | INTRAMUSCULAR | Status: AC
  Administered 2013-12-29: 20 mg via INTRAMUSCULAR

## 2013-12-29 NOTE — Assessment & Plan Note (Signed)
Lab Results  Component Value Date   HGBA1C 8.7 12/29/2013   HGBA1C 9.3 09/15/2013   HGBA1C 7.9 06/15/2013     Assessment: Diabetes control:  improving  Progress toward A1C goal:   improving Comments: pt reports dietary indescritions and not taking lantus for at least a week due to inability to afford it.   Plan: Medications:  continue current medications, levemir was provided as a free sample in clinic to take 15 units as well as continuing [place] glucose monitoring: Frequency:   Timing:   Instruction/counseling given: reminded to get eye exam, reminded to bring blood glucose meter & log to each visit, reminded to bring medications to each visit, discussed foot care, discussed the need for weight loss and discussed diet Educational resources provided: brochure Self management tools provided:   Other plans: follow up with insulin adherence may also benefit from DM educator in future if pt is amenable

## 2013-12-29 NOTE — Progress Notes (Signed)
Subjective:    Patient ID: [name], female    DOB: [birth year], 53 y.o.   MRN: [id number]  HPI [name] is a 53 yo woman pmh as listed below presents for acute posion ivy rash.   Pt states she was working in [name]'s yard where she was carrying some cardboard boxes that were placed near a posion ivy tree and then within 24 hours she started noticing a rash on Marisa hands and forearms. She does sleep with Marisa hands on/near Marisa face and then noticed the rash and swelling spread there. It is itchy in nature. She has tried oral benadryl and topical calamine lotion as well as potatoes on Marisa eye for relief. She does state that all of these things have greatly improved Marisa symptoms. The only other contact she can think of is over the weekend she went to a casino and played slot machines and [name] also has had a similar outbreak. She denied any fever, chills, nausea/vomiting/diarrhea, jaundice, joint pain or DOE. She states this rash is similar to one she has had previously with poison ivy exposure.   Past Medical History  Diagnosis Date  . Adhesive capsulitis of right shoulder     with underlying tendinopathy rotator cuff  . History of post-sterilization tuboplasty 2000  . Tear of meniscus of left knee     x2  . Tear of meniscus of right knee   . Plantar fasciitis     Right  . Shortness of breath     with exertion  . Diabetes mellitus     oral tx  . GERD (gastroesophageal reflux disease)   . Arthritis   . Sleep apnea 5 plus yrs    study -pt could not sleep test inconclusive.    Current Outpatient Prescriptions on File Prior to Visit  Medication Sig Dispense Refill  . Blood Glucose Monitoring Suppl (AGAMATRIX PRESTO) W/DEVICE KIT Dispense 1 kit for glucose monitoring  1 kit  0  . HYDROcodone-acetaminophen (NORCO) 10-325 MG per tablet Take 1 tablet by mouth 3 (three) times daily as needed for moderate pain.  80 tablet  0  . insulin glargine (LANTUS) 100 UNIT/ML  injection Inject 15 units at bedtime  10 mL  11  . Liraglutide (VICTOZA [place]) Inject 1.8 mg into the skin daily.      . nitroGLYCERIN (NITRODUR - DOSED IN MG/24 HR) 0.2 mg/hr patch Place 1 patch (0.2 mg total) onto the skin daily.  30 patch  1  . omeprazole (PRILOSEC) 20 MG capsule Take 1 capsule (20 mg total) by mouth daily.  30 capsule  11  . traMADol (ULTRAM) 50 MG tablet Take 2 tablets (100 mg total) by mouth every 8 (eight) hours as needed.  180 tablet  0   No current facility-administered medications on file prior to visit.   Social, surgical, family history reviewed with patient and updated in appropriate chart locations.   Review of Systems  Negative except what is listed in HPI     Objective:   Physical Exam Filed Vitals:   12/29/13 1405  BP: 139/84  Pulse: 80  Temp: 97.9 F (36.6 C)   General: sitting in chair, NAD HEENT: PERRL, EOMI, no scleral icterus, some erythema and swelling of face near eyelid and bilateral cheeks, some wheals on forearms of right and left arms, MMM, no edema, patent airway Cardiac: RRR, no rubs, murmurs or gallops Pulm: clear to auscultation bilaterally, moving normal volumes of air Abd: soft, nontender,  nondistended, BS present Ext: warm and well perfused, no pedal edema Neuro: alert and oriented X3, cranial nerves II-XII grossly intact    Assessment & Plan:  Please see problem oriented charting  Pt discussed with Dr. Dareen Piano

## 2013-12-29 NOTE — Patient Instructions (Addendum)
General Instructions:   Please bring your medicines with you each time you come to clinic.  Medicines may include prescription medications, over-the-counter medications, herbal remedies, eye drops, vitamins, or other pills.   Progress Toward Treatment Goals:  Treatment Goal 06/15/2013  Hemoglobin A1C improved    Self Care Goals & Plans:  Self Care Goal 12/29/2013  Manage my medications take my medicines as prescribed; bring my medications to every visit; refill my medications on time  Monitor my health -  Eat healthy foods drink diet soda or water instead of juice or soda; eat more vegetables; eat foods that are low in salt; eat baked foods instead of fried foods; eat fruit for snacks and desserts  Be physically active -    Home Blood Glucose Monitoring 06/15/2013  Check my blood sugar once a day  When to check my blood sugar before breakfast     Care Management & Community Referrals:  Referral 06/15/2013  Referrals made for care management support diabetes educator       Poison [facility] ivy is a inflammation of the skin (contact dermatitis) caused by touching the allergens on the leaves of the ivy plant following previous exposure to the plant. The rash usually appears 48 hours after exposure. The rash is usually bumps (papules) or blisters (vesicles) in a linear pattern. Depending on your own sensitivity, the rash may simply cause redness and itching, or it may also progress to blisters which may break open. These must be well cared for to prevent secondary bacterial (germ) infection, followed by scarring. Keep any open areas dry, clean, dressed, and covered with an antibacterial ointment if needed. The eyes may also get puffy. The puffiness is worst in the morning and gets better as the day progresses. This dermatitis usually heals without scarring, within 2 to 3 weeks without treatment. HOME CARE INSTRUCTIONS  Thoroughly wash with soap and water as soon as you have been  exposed to poison ivy. You have about one half hour to remove the plant resin before it will cause the rash. This washing will destroy the oil or antigen on the skin that is causing, or will cause, the rash. Be sure to wash under your fingernails as any plant resin there will continue to spread the rash. Do not rub skin vigorously when washing affected area. Poison ivy cannot spread if no oil from the plant remains on your body. A rash that has progressed to weeping sores will not spread the rash unless you have not washed thoroughly. It is also important to wash any clothes you have been wearing as these may carry active allergens. The rash will return if you wear the unwashed clothing, even several days later. Avoidance of the plant in the future is the best measure. Poison ivy plant can be recognized by the number of leaves. Generally, poison ivy has three leaves with flowering branches on a single stem. Diphenhydramine may be purchased over the counter and used as needed for itching. Do not drive with this medication if it makes you drowsy.Ask your caregiver about medication for children. SEEK MEDICAL CARE IF:  Open sores develop.  Redness spreads beyond area of rash.  You notice purulent (pus-like) discharge.  You have increased pain.  Other signs of infection develop (such as fever). Document Released: 06/29/2000 Document Revised: 09/24/2011 Document Reviewed: 05/18/2009 [facility] Patient Information 2014 [place], [place].

## 2013-12-29 NOTE — Assessment & Plan Note (Signed)
Pt has known contact with poison ivy and has had similar outbreaks in the past. Pt does have some face manifestations given contact from her hands to face during sleep. She doesn't have any compromise to the face and it has been a positive sign that oral antihistamines have helped. Pt is reluctant to use creams on face and near eye therefore IM was given in clinic.  -IM depomedrol 20mg  -IM benadryl 25 mg  -continued use of oral antihistamine and calamine lotion recommended -if vision becomes compromised or rash worsens pt to present for follow up and re-evaluation pt verbalized understanding

## 2013-12-30 NOTE — Progress Notes (Signed)
INTERNAL MEDICINE TEACHING ATTENDING ADDENDUM - Aldine Contes, MD: I reviewed and discussed with the resident Dr. Algis Liming, the patient's medical history, physical examination, diagnosis and results of pertinent tests and treatment and I agree with the patient's care as documented.

## 2014-01-03 ENCOUNTER — Encounter (HOSPITAL_COMMUNITY): Payer: Self-pay | Admitting: Emergency Medicine

## 2014-01-03 ENCOUNTER — Emergency Department (HOSPITAL_COMMUNITY)
Admission: EM | Admit: 2014-01-03 | Discharge: 2014-01-04 | Disposition: A | Discharge status: Home / Self Care | Payer: No Typology Code available for payment source | Attending: Emergency Medicine | Admitting: Emergency Medicine

## 2014-01-03 DIAGNOSIS — Z794 Long term (current) use of insulin: Secondary | ICD-10-CM | POA: Insufficient documentation

## 2014-01-03 DIAGNOSIS — L309 Dermatitis, unspecified: Secondary | ICD-10-CM

## 2014-01-03 DIAGNOSIS — R51 Headache: Secondary | ICD-10-CM | POA: Insufficient documentation

## 2014-01-03 DIAGNOSIS — H538 Other visual disturbances: Secondary | ICD-10-CM | POA: Insufficient documentation

## 2014-01-03 DIAGNOSIS — E119 Type 2 diabetes mellitus without complications: Secondary | ICD-10-CM | POA: Insufficient documentation

## 2014-01-03 DIAGNOSIS — L259 Unspecified contact dermatitis, unspecified cause: Secondary | ICD-10-CM | POA: Insufficient documentation

## 2014-01-03 DIAGNOSIS — Z8739 Personal history of other diseases of the musculoskeletal system and connective tissue: Secondary | ICD-10-CM | POA: Insufficient documentation

## 2014-01-03 DIAGNOSIS — Z79899 Other long term (current) drug therapy: Secondary | ICD-10-CM | POA: Insufficient documentation

## 2014-01-03 DIAGNOSIS — K219 Gastro-esophageal reflux disease without esophagitis: Secondary | ICD-10-CM | POA: Insufficient documentation

## 2014-01-03 DIAGNOSIS — Z87828 Personal history of other (healed) physical injury and trauma: Secondary | ICD-10-CM | POA: Insufficient documentation

## 2014-01-03 DIAGNOSIS — H5789 Other specified disorders of eye and adnexa: Secondary | ICD-10-CM | POA: Insufficient documentation

## 2014-01-03 MED ORDER — TETRACAINE HCL 0.5 % OP SOLN
1.0000 [drp] | Freq: Once | OPHTHALMIC | Status: AC
  Administered 2014-01-04: 1 [drp] via OPHTHALMIC
  Filled 2014-01-03: qty 2

## 2014-01-03 MED ORDER — HYDROXYZINE HCL 25 MG PO TABS
25.0000 mg | ORAL_TABLET | Freq: Once | ORAL | Status: AC
  Administered 2014-01-04: 25 mg via ORAL
  Filled 2014-01-03: qty 1

## 2014-01-03 MED ORDER — FLUORESCEIN SODIUM 1 MG OP STRP
2.0000 | ORAL_STRIP | Freq: Once | OPHTHALMIC | Status: AC
  Administered 2014-01-04: 2 via OPHTHALMIC
  Filled 2014-01-03: qty 2

## 2014-01-03 NOTE — ED Notes (Signed)
Both eyes red and itching since Sunday.  [facility] was seen  [facility] and diagnosed with poison ivy but her eyes are no better

## 2014-01-03 NOTE — ED Notes (Signed)
PT took tramadol for pain at home.

## 2014-01-03 NOTE — ED Provider Notes (Signed)
CSN: [id number]     Arrival date & time 01/03/14  2211 History  This chart was scribed for Marisa Cater, PA-C working with Alfonzo Feller, DO by Randa Evens, ED Scribe. This patient was seen in room TR04C/TR04C and the patient's care was started at 11:45 PM.     Chief Complaint  Patient presents with  . Rash   Patient is a 53 y.o. female presenting with rash. The history is provided by the patient. No language interpreter was used.  Rash Location:  Face Quality: itchiness and redness   Severity:  Mild Duration:  8 days Associated symptoms: headaches   Associated symptoms: no abdominal pain, no diarrhea, no fever, no myalgias, no nausea, no shortness of breath, no sore throat, not vomiting and not wheezing    HPI Comments: [name] is a 53 y.o. female who presents to the Emergency Department complaining of rash onset 8 days prior. She states she saw her PCP Tuesday and received a cortisone shot and benadryl with temporary relief. She states she has associated eye pain, eye itching, eye redness, visual disturbance, mild headache. She states she has tried OTC poison ivy creams, topical corn starch, prednisone with no relief.    Past Medical History  Diagnosis Date  . Adhesive capsulitis of right shoulder     with underlying tendinopathy rotator cuff  . History of post-sterilization tuboplasty 2000  . Tear of meniscus of left knee     x2  . Tear of meniscus of right knee   . Plantar fasciitis     Right  . Shortness of breath     with exertion  . Diabetes mellitus     oral tx  . GERD (gastroesophageal reflux disease)   . Arthritis   . Sleep apnea 5 plus yrs    study -pt could not sleep test inconclusive.    Past Surgical History  Procedure Laterality Date  . Cholecystectomy    . Meniscus repair      R and L (L x2)  . Tonsillectomy    . Knee arthroscopy  10/10/2011    Procedure: ARTHROSCOPY KNEE;  Surgeon: Newt Minion, MD;  Location: [place];  Service:  Orthopedics;  Laterality: Right;  Right Knee Arthroscopy and Excision Medial Meniscus  . Tubal ligation     Family History  Problem Relation Age of Onset  . Cancer Sister     both sisters have breast cancer   History  Substance Use Topics  . Smoking status: Never Smoker   . Smokeless tobacco: Never Used  . Alcohol Use: No   OB History   Grav Para Term Preterm Abortions TAB SAB Ect Mult Living                 Review of Systems  Constitutional: Negative for fever.  HENT: Negative for facial swelling, rhinorrhea, sore throat and trouble swallowing.   Eyes: Positive for pain, redness, itching and visual disturbance.  Respiratory: Negative for cough, shortness of breath, wheezing and stridor.   Cardiovascular: Negative for chest pain.  Gastrointestinal: Negative for nausea, vomiting, abdominal pain and diarrhea.  Genitourinary: Negative for dysuria.  Musculoskeletal: Negative for myalgias.  Skin: Positive for rash.  Neurological: Positive for headaches. Negative for light-headedness.  Psychiatric/Behavioral: Negative for confusion.    Allergies  Ofloxacin  Home Medications   Prior to Admission medications   Medication Sig Start Date End Date Taking? Authorizing Provider  Blood Glucose Monitoring Suppl (AGAMATRIX PRESTO) W/DEVICE KIT Dispense 1  kit for glucose monitoring 09/15/13   Olga Millers, MD  HYDROcodone-acetaminophen [facility]) 10-325 MG per tablet Take 1 tablet by mouth 3 (three) times daily as needed for moderate pain. 12/25/13   Sid Falcon, MD  insulin glargine (LANTUS) 100 UNIT/ML injection Inject 15 units at bedtime    Na Nicoletta Dress, MD  Liraglutide (VICTOZA [place]) Inject 1.8 mg into the skin daily.    Historical Provider, MD  nitroGLYCERIN (NITRODUR - DOSED IN MG/24 HR) 0.2 mg/hr patch Place 1 patch (0.2 mg total) onto the skin daily. 08/21/13   Duane Boston, MD  omeprazole (PRILOSEC) 20 MG capsule Take 1 capsule (20 mg total) by mouth daily. 06/15/13   Charlann Lange, MD  traMADol  (ULTRAM) 50 MG tablet Take 2 tablets (100 mg total) by mouth every 8 (eight) hours as needed. 12/23/13   Milta Deiters, MD   Triage Vitals: BP 130/71  Pulse 93  Temp(Src) 97.8 F (36.6 C) (Oral)  Resp 17  Ht '5\' 1"'  (1.549 m)  Wt 207 lb (93.895 kg)  BMI 39.13 kg/m2  SpO2 96%  LMP 12/23/2013  Physical Exam  Nursing note and vitals reviewed. Constitutional: She is oriented to person, place, and time. She appears well-developed and well-nourished. No distress.  HENT:  Head: Normocephalic and atraumatic.  Right Ear: External ear normal.  Left Ear: External ear normal.  Nose: Nose normal.  Mouth/Throat: Oropharynx is clear and moist.  Eyes: Conjunctivae and EOM are normal. Pupils are equal, round, and reactive to light. Right eye exhibits no discharge. Left eye exhibits no discharge. Right conjunctiva is not injected. Left conjunctiva is not injected. Right eye exhibits normal extraocular motion. Left eye exhibits normal extraocular motion.  Slit lamp exam:      The right eye shows no corneal abrasion, no corneal ulcer, no foreign body, no hyphema and no fluorescein uptake.       The left eye shows no corneal abrasion, no corneal ulcer, no foreign body, no hyphema and no fluorescein uptake.  Periorbital erythema consistent with dermatitis. No warmth. No cellulitic features.  Neck: Normal range of motion. Neck supple. No tracheal deviation present.  Cardiovascular: Normal rate.   No murmur heard. Pulmonary/Chest: Effort normal. No respiratory distress. She has no wheezes. She has no rales.  Musculoskeletal: Normal range of motion.  Neurological: She is alert and oriented to person, place, and time.  Skin: Skin is warm and dry.  Small scattered papules on extremities  Psychiatric: She has a normal mood and affect. Her behavior is normal.    ED Course  Procedures (including critical care time) DIAGNOSTIC STUDIES: Oxygen Saturation is 96% on RA, adequate by my interpretation.     COORDINATION OF CARE:  Labs Review Labs Reviewed - No data to display  Imaging Review No results found.   EKG Interpretation None      Patient seen and examined.   Vital signs reviewed and are as follows: Filed Vitals:   01/03/14 2225  BP: 130/71  Pulse: 93  Temp: 97.8 F (36.6 C)  Resp: 17   12:22 AM Two drops of tetracaine/proparacaine instilled into eyes.   Fluorescein strip applied to affected eye. Wood's lamp used to assess for corneal abrasion or other uptake. No uptake identified. No foreign bodies noted. No visible hyphema.   Patient tolerated procedure well without immediate complication.   Patient given hydroxyzine and prednisone in ED.   Encouraged patient to followup with PCP in 2-3 days to evaluate response to medication.  Patient verbalizes understanding and agrees with plan.   Patient urged to return with worsening symptoms or other concerns. Patient verbalized understanding and agrees with plan.   MDM   Final diagnoses:  Dermatitis of face   Patient with redness and itching around the eyes consistent with dermatitis. Patient did note some improvement after steroid shot and Benadryl given by PCP. I am concerned about rebound reaction as steroids wore off. Will give 12 day tapered course. Patient does report some blurry vision. No fluorescein uptake. No conjunctivitis. Doubt inflammatory or infectious involvement of globe itself. PCP followup indicated to ensure improvement in symptoms.  I personally performed the services described in this documentation, which was scribed in my presence. The recorded information has been reviewed and is accurate.      Marisa Cater, PA-C 01/04/14 [id number]

## 2014-01-04 MED ORDER — PREDNISONE 20 MG PO TABS: ORAL_TABLET | ORAL | Status: DC

## 2014-01-04 MED ORDER — PREDNISONE 20 MG PO TABS
60.0000 mg | ORAL_TABLET | Freq: Once | ORAL | Status: AC
  Administered 2014-01-04: 60 mg via ORAL
  Filled 2014-01-04: qty 3

## 2014-01-04 MED ORDER — HYDROXYZINE HCL 25 MG PO TABS: 25.0000 mg | ORAL_TABLET | Freq: Four times a day (QID) | ORAL | Status: DC

## 2014-01-04 NOTE — ED Notes (Signed)
Declined W/C at D/C and was escorted to lobby by RN. 

## 2014-01-04 NOTE — Discharge Instructions (Signed)
Please read and follow all provided instructions.  Your diagnoses today include:  1. Dermatitis of face    Tests performed today include:  Vital signs. See below for your results today.   Medications prescribed:   Prednisone - steroid medicine   It is best to take this medication in the morning to prevent sleeping problems. If you are diabetic, monitor your blood sugar closely and stop taking Prednisone if blood sugar is over 300. Take with food to prevent stomach upset.    Hydroxyzine - medication for itching  This medication will make you drowsy. DO NOT drive or perform any activities that require you to be awake and alert if taking this.  Take any prescribed medications only as directed.  Home care instructions:  Follow any educational materials contained in this packet.  BE VERY CAREFUL not to take multiple medicines containing Tylenol (also called acetaminophen). Doing so can lead to an overdose which can damage your liver and cause liver failure and possibly death.   Follow-up instructions: Please follow-up with your primary care provider in the next 2 days for further evaluation of your symptoms.   Return instructions:   Please return to the Emergency Department if you experience worsening symptoms.   Please return if you have any other emergent concerns.  Additional Information:  Your vital signs today were: BP 130/71   Pulse 93   Temp(Src) 97.8 F (36.6 C) (Oral)   Resp 17   Ht 5\' 1"  (1.549 m)   Wt 207 lb (93.895 kg)   BMI 39.13 kg/m2   SpO2 96%   LMP 12/23/2013 If your blood pressure (BP) was elevated above 135/85 this visit, please have this repeated by your doctor within one month. --------------

## 2014-01-05 NOTE — ED Provider Notes (Signed)
Medical screening examination/treatment/procedure(s) were performed by non-physician practitioner and as supervising physician I was immediately available for consultation/collaboration.   EKG Interpretation None        Alfonzo Feller, DO 01/05/14 1808

## 2014-01-06 ENCOUNTER — Ambulatory Visit: Payer: No Typology Code available for payment source | Admitting: Internal Medicine

## 2014-01-06 ENCOUNTER — Ambulatory Visit (INDEPENDENT_AMBULATORY_CARE_PROVIDER_SITE_OTHER): Payer: No Typology Code available for payment source | Admitting: Internal Medicine

## 2014-01-06 VITALS — BP 114/75 | HR 85 | Temp 97.7°F | Ht 61.0 in | Wt 208.5 lb

## 2014-01-06 DIAGNOSIS — L538 Other specified erythematous conditions: Secondary | ICD-10-CM | POA: Insufficient documentation

## 2014-01-06 DIAGNOSIS — IMO0001 Reserved for inherently not codable concepts without codable children: Secondary | ICD-10-CM

## 2014-01-06 DIAGNOSIS — E1165 Type 2 diabetes mellitus with hyperglycemia: Secondary | ICD-10-CM

## 2014-01-06 LAB — GLUCOSE, CAPILLARY: Glucose-Capillary: 279 mg/dL — ABNORMAL HIGH (ref 70–99)

## 2014-01-06 MED ORDER — HYDROXYZINE HCL 50 MG PO TABS: 50.0000 mg | ORAL_TABLET | ORAL | Status: DC | PRN

## 2014-01-06 NOTE — Patient Instructions (Signed)
General Instructions:  STOP ALL PREDNISONE TODAY.   We increased your itch medicine and you can take it every 4 hrs.   Need to see you in 1 wk.   Please bring your medicines with you each time you come to clinic.  Medicines may include prescription medications, over-the-counter medications, herbal remedies, eye drops, vitamins, or other pills.   Progress Toward Treatment Goals:  Treatment Goal 06/15/2013  Hemoglobin A1C improved    Self Care Goals & Plans:  Self Care Goal 01/06/2014  Manage my medications take my medicines as prescribed; bring my medications to every visit; refill my medications on time  Monitor my health keep track of my blood glucose; bring my glucose meter and log to each visit  Eat healthy foods drink diet soda or water instead of juice or soda; eat more vegetables; eat foods that are low in salt; eat baked foods instead of fried foods; eat fruit for snacks and desserts  Be physically active -    Home Blood Glucose Monitoring 06/15/2013  Check my blood sugar once a day  When to check my blood sugar before breakfast     Care Management & Community Referrals:  Referral 06/15/2013  Referrals made for care management support diabetes educator

## 2014-01-06 NOTE — Progress Notes (Signed)
Subjective:    Patient ID: [name], female    DOB: [birth year], 53 y.o.   MRN: [id number]  HPI [name] is a 53 yo woman pmh as listed below presents acutely for ED follow up.   Pt was recently seen for poison ivy dermatitis and apparently the patient had worsening of rash and blurry vision. She had full opthalmic exam in ED that showed no foreign body or corneal damage. The patient was given 12 day steroid taper and hydroxyzine which she has been taking. Since that time the patient reports her symptoms have not really improved. She continues to have bilateral erythema around her eyes that are intensely puritic in nature. She states that even her husband has found her itching both of her eyes at night and the hydroxyzine is only providing minimal relief and she is taking it Q6 hours. She has not had any re\re exposure to poison ivy. And is having good improvement of the rash that started on her forearms and chest area that is quickly resolving. She continues to do her ADLs. She reports that 2 days ago she went bowling and had diplopia. At night her eye does feel painful and burning in nature and she is only had minimal drainage from the eyes for the past day. She continues to not have any fever or chills, nausea/vomiting/diarrhea, new rashes, or new exposures.  Past Medical History  Diagnosis Date  . Adhesive capsulitis of right shoulder     with underlying tendinopathy rotator cuff  . History of post-sterilization tuboplasty 2000  . Tear of meniscus of left knee     x2  . Tear of meniscus of right knee   . Plantar fasciitis     Right  . Shortness of breath     with exertion  . Diabetes mellitus     oral tx  . GERD (gastroesophageal reflux disease)   . Arthritis   . Sleep apnea 5 plus yrs    study -pt could not sleep test inconclusive.    Current Outpatient Prescriptions on File Prior to Visit  Medication Sig Dispense Refill  . Blood Glucose Monitoring Suppl (AGAMATRIX  PRESTO) W/DEVICE KIT Dispense 1 kit for glucose monitoring  1 kit  0  . HYDROcodone-acetaminophen (NORCO) 10-325 MG per tablet Take 1 tablet by mouth 3 (three) times daily as needed for moderate pain.  80 tablet  0  . hydrOXYzine (ATARAX/VISTARIL) 25 MG tablet Take 1 tablet (25 mg total) by mouth every 6 (six) hours.  15 tablet  0  . insulin glargine (LANTUS) 100 UNIT/ML injection Inject 15 units at bedtime  10 mL  11  . Liraglutide (VICTOZA [place]) Inject 1.8 mg into the skin daily.      . nitroGLYCERIN (NITRODUR - DOSED IN MG/24 HR) 0.2 mg/hr patch Place 1 patch (0.2 mg total) onto the skin daily.  30 patch  1  . omeprazole (PRILOSEC) 20 MG capsule Take 1 capsule (20 mg total) by mouth daily.  30 capsule  11  . predniSONE (DELTASONE) 20 MG tablet 3 Tabs PO Days 1-3, then 2 tabs PO Days 4-6, then 1 tab PO Day 7-9, then Half Tab PO Day 10-12  20 tablet  0  . traMADol (ULTRAM) 50 MG tablet Take 2 tablets (100 mg total) by mouth every 8 (eight) hours as needed.  180 tablet  0   No current facility-administered medications on file prior to visit.  Social, surgical, family history reviewed with patient and updated  in appropriate chart locations.   Review of Systems Negative except As listed in history of present illness     Objective:   Physical Exam Filed Vitals:   01/06/14 0953  BP: 114/75  Pulse: 85  Temp: 97.7 F (36.5 C)   General: sitting in chair, NAD HEENT: PERRL, EOMI, no scleral icterus or conjunctival injection, bilateral periorbital erythematous blanchable macular rash circumstantiating in nature with clear demarcation of border Cardiac: RRR, no rubs, murmurs or gallops Pulm: clear to auscultation bilaterally, no crackles, wheezes, or rhonchi, moving normal volumes of air Abd: soft, nontender, nondistended, BS present Ext: warm and well perfused, no pedal edema Neuro: alert and oriented X3, cranial nerves II-XII grossly intact    Assessment & Plan:  Please see problem oriented  charting  Pt discussed with Dr. Lynnae January

## 2014-01-06 NOTE — Assessment & Plan Note (Addendum)
There is a wide ddx that include possible cellulitis given its abrupt onset and no other associated systemic signs/symptoms, zoster atypical given bilateral distribution, pt doesn't have any discrete lesion on the eyelid to suggest tumor or hordeolum. Therefore most likely periorbital dermatitis given its abrupt onset, and lack of any red flag symptoms.  -will d/c all steroids per recent uptodate recommendations  Perioral dermatitis.Lipozencic J, [place] 2011 Mar;29(2):157-61. -referral to dermatology and opthalmology if diplopia continues  -f/u in 1 wk

## 2014-01-08 ENCOUNTER — Telehealth: Payer: Self-pay | Admitting: *Deleted

## 2014-01-08 NOTE — Telephone Encounter (Signed)
Pt called - states rash on arms and chest getting worse. Talked with Dr Algis Liming - needs to go to ER. Referrals given to [facility].  Unable to send to dermatology today per Dr Algis Liming - needs to go through [facility] for appt fue to [facility].   Pt states sister may pay for visit. Pt decided to call some dermatology groups for appt and cost of visit. Hilda Blades Ayleen Mckinstry RN 01/08/14 2:20PM

## 2014-01-09 ENCOUNTER — Emergency Department (HOSPITAL_COMMUNITY)
Admission: EM | Admit: 2014-01-09 | Discharge: 2014-01-09 | Disposition: A | Discharge status: Home / Self Care | Payer: No Typology Code available for payment source | Attending: Emergency Medicine | Admitting: Emergency Medicine

## 2014-01-09 ENCOUNTER — Encounter (HOSPITAL_COMMUNITY): Payer: Self-pay | Admitting: Emergency Medicine

## 2014-01-09 DIAGNOSIS — E119 Type 2 diabetes mellitus without complications: Secondary | ICD-10-CM | POA: Insufficient documentation

## 2014-01-09 DIAGNOSIS — Z794 Long term (current) use of insulin: Secondary | ICD-10-CM | POA: Insufficient documentation

## 2014-01-09 DIAGNOSIS — K219 Gastro-esophageal reflux disease without esophagitis: Secondary | ICD-10-CM | POA: Insufficient documentation

## 2014-01-09 DIAGNOSIS — H538 Other visual disturbances: Secondary | ICD-10-CM | POA: Insufficient documentation

## 2014-01-09 DIAGNOSIS — M129 Arthropathy, unspecified: Secondary | ICD-10-CM | POA: Insufficient documentation

## 2014-01-09 DIAGNOSIS — L298 Other pruritus: Secondary | ICD-10-CM

## 2014-01-09 DIAGNOSIS — L299 Pruritus, unspecified: Secondary | ICD-10-CM | POA: Insufficient documentation

## 2014-01-09 DIAGNOSIS — L538 Other specified erythematous conditions: Secondary | ICD-10-CM | POA: Insufficient documentation

## 2014-01-09 DIAGNOSIS — Z8739 Personal history of other diseases of the musculoskeletal system and connective tissue: Secondary | ICD-10-CM | POA: Insufficient documentation

## 2014-01-09 DIAGNOSIS — Z87828 Personal history of other (healed) physical injury and trauma: Secondary | ICD-10-CM | POA: Insufficient documentation

## 2014-01-09 DIAGNOSIS — Z79899 Other long term (current) drug therapy: Secondary | ICD-10-CM | POA: Insufficient documentation

## 2014-01-09 MED ORDER — FAMOTIDINE 20 MG PO TABS: 20.0000 mg | ORAL_TABLET | Freq: Two times a day (BID) | ORAL | Status: DC

## 2014-01-09 MED ORDER — LORAZEPAM 1 MG PO TABS: 1.0000 mg | ORAL_TABLET | Freq: Four times a day (QID) | ORAL | Status: DC | PRN

## 2014-01-09 MED ORDER — CYPROHEPTADINE HCL 4 MG PO TABS: 4.0000 mg | ORAL_TABLET | Freq: Three times a day (TID) | ORAL | Status: DC | PRN

## 2014-01-09 MED ORDER — CYPROHEPTADINE HCL 4 MG PO TABS
4.0000 mg | ORAL_TABLET | Freq: Once | ORAL | Status: AC
  Administered 2014-01-09: 4 mg via ORAL
  Filled 2014-01-09: qty 1

## 2014-01-09 MED ORDER — FAMOTIDINE 20 MG PO TABS
20.0000 mg | ORAL_TABLET | Freq: Once | ORAL | Status: AC
  Administered 2014-01-09: 20 mg via ORAL
  Filled 2014-01-09: qty 1

## 2014-01-09 NOTE — ED Provider Notes (Signed)
CSN: [id number]     Arrival date & time 01/09/14  2012 History   First MD Initiated Contact with Patient 01/09/14 2122     Chief Complaint  Patient presents with  . Rash     (Consider location/radiation/quality/duration/timing/severity/associated sxs/prior Treatment) HPI Pt is a 53yo female c/o severely pruritic erythematous raised rash around bilateral eyes, bilateral wrists and thighs x2 weeks.  Rash also appearing on upper chest x3 days.  Pt has been seen by PCP as well as ED, tx with steroids and visteril with minimal relief. Denies fever, n/v/d. Denies sore throat, tongue or lip swelling. Denies difficulty breathing.  Denies contact with others with similar rash. No recent travel. Has tried benadryl and topical anti-itch medications w/o relief.  Denies hx of similar rash. No known allergies. No recent insect bites or tick exposure.   Past Medical History  Diagnosis Date  . Adhesive capsulitis of right shoulder     with underlying tendinopathy rotator cuff  . History of post-sterilization tuboplasty 2000  . Tear of meniscus of left knee     x2  . Tear of meniscus of right knee   . Plantar fasciitis     Right  . Shortness of breath     with exertion  . Diabetes mellitus     oral tx  . GERD (gastroesophageal reflux disease)   . Arthritis   . Sleep apnea 5 plus yrs    study -pt could not sleep test inconclusive.    Past Surgical History  Procedure Laterality Date  . Cholecystectomy    . Meniscus repair      R and L (L x2)  . Tonsillectomy    . Knee arthroscopy  10/10/2011    Procedure: ARTHROSCOPY KNEE;  Surgeon: Newt Minion, MD;  Location: [facility];  Service: Orthopedics;  Laterality: Right;  Right Knee Arthroscopy and Excision Medial Meniscus  . Tubal ligation     Family History  Problem Relation Age of Onset  . Cancer Sister     both sisters have breast cancer   History  Substance Use Topics  . Smoking status: Never Smoker   . Smokeless tobacco: Never Used  .  Alcohol Use: No   OB History   Grav Para Term Preterm Abortions TAB SAB Ect Mult Living                 Review of Systems  Constitutional: Negative for fever, chills and diaphoresis.  HENT: Negative for sore throat, trouble swallowing and voice change.   Eyes: Positive for redness, itching and visual disturbance ( blurry, bilaterally). Negative for photophobia, pain and discharge.  Respiratory: Negative for cough, shortness of breath, wheezing and stridor.   Cardiovascular: Negative for chest pain and palpitations.  Gastrointestinal: Negative for nausea, vomiting and abdominal pain.  Skin: Positive for rash. Negative for color change and wound.  All other systems reviewed and are negative.     Allergies  Ofloxacin  Home Medications   Prior to Admission medications   Medication Sig Start Date End Date Taking? Authorizing Provider  HYDROcodone-acetaminophen (NORCO) 10-325 MG per tablet Take 1 tablet by mouth 3 (three) times daily as needed for moderate pain. 12/25/13  Yes Sid Falcon, MD  hydrocortisone cream 0.5 % Apply 1 application topically 2 (two) times daily as needed for itching.   Yes Historical Provider, MD  hydrOXYzine (ATARAX/VISTARIL) 50 MG tablet Take 1 tablet (50 mg total) by mouth every 4 (four) hours as needed for itching.  01/06/14  Yes Clinton Gallant, MD  insulin glargine (LANTUS) 100 UNIT/ML injection Inject 15 units at bedtime   Yes Na Nicoletta Dress, MD  Liraglutide (VICTOZA [place]) Inject 1.8 mg into the skin daily.   Yes Historical Provider, MD  traMADol (ULTRAM) 50 MG tablet Take 50 mg by mouth every 6 (six) hours as needed for moderate pain.   Yes Historical Provider, MD  Blood Glucose Monitoring Suppl (AGAMATRIX PRESTO) W/DEVICE KIT Dispense 1 kit for glucose monitoring 09/15/13   Olga Millers, MD  cyproheptadine (PERIACTIN) 4 MG tablet Take 1 tablet (4 mg total) by mouth 3 (three) times daily as needed for allergies. 01/09/14   Noland Fordyce, PA-C  famotidine (PEPCID) 20 MG  tablet Take 1 tablet (20 mg total) by mouth 2 (two) times daily. 01/09/14   Noland Fordyce, PA-C  LORazepam (ATIVAN) 1 MG tablet Take 1 tablet (1 mg total) by mouth every 6 (six) hours as needed for anxiety or sleep. 01/09/14   Noland Fordyce, PA-C   BP 120/83  Pulse 107  Temp(Src) 98.3 F (36.8 C) (Oral)  Resp 20  Wt 207 lb (93.895 kg)  SpO2 96%  LMP 12/23/2013 Physical Exam  Nursing note and vitals reviewed. Constitutional: She appears well-developed and well-nourished. No distress.  HENT:  Head: Normocephalic and atraumatic.  Eyes: Conjunctivae and EOM are normal. Pupils are equal, round, and reactive to light. No scleral icterus.  Neck: Normal range of motion.  Cardiovascular: Normal rate, regular rhythm and normal heart sounds.   Pulmonary/Chest: Effort normal and breath sounds normal. No respiratory distress. She has no wheezes. She has no rales. She exhibits no tenderness.  Abdominal: Soft. Bowel sounds are normal. She exhibits no distension and no mass. There is no tenderness. There is no rebound and no guarding.  Musculoskeletal: Normal range of motion.  Neurological: She is alert.  Skin: Skin is warm and dry. Rash noted. She is not diaphoretic. There is erythema.  Erythematous macularpapular rash bilateral periorbital region, consistent with contact dermatitis. Not concerning for cellulitis.  Similar rash on upper chest, volar aspect wrist bilaterally, medial thighs bilaterally     ED Course  Procedures (including critical care time) Labs Review Labs Reviewed - No data to display  Imaging Review No results found.   EKG Interpretation None      MDM   Final diagnoses:  Pruritic erythematous rash    Pt is a 52yo female presenting to ED for further evaluation of persistent erythematous pruritic rash around eyes, upper chest, bilateral arms and thighs. No systemic symptoms. No evidence of underlying infection. Discussed pt with Dr. Eulis Foster who also examined pt. Rash  appears consistent with contact dermatitis.  Will add pepcid, periactin, and ativan to pt's home tx of topical anti-itch medications. Advised pt to f/u with PCP and dermatology as needed for continued rash.  Return precautions provided. Pt verbalized understanding and agreement with tx plan.     Noland Fordyce, PA-C 01/10/14 0045

## 2014-01-09 NOTE — ED Notes (Signed)
Pt c/o rah to face, torso, arms and legs x 2 weeks. Pt has been seen multiple times for this, was taking steroids was taken off by PCP and told to increase Vistaril. Pt states itching worse today.

## 2014-01-09 NOTE — Discharge Instructions (Signed)
Rash A rash is a change in the color or feel of your skin. There are many different types of rashes. You may have other problems along with your rash. HOME CARE  Avoid the thing that caused your rash.  Do not scratch your rash.  You may take cools baths to help stop itching.  Only take medicines as told by your doctor.  Keep all doctor visits as told. GET HELP RIGHT AWAY IF:   Your pain, puffiness (swelling), or redness gets worse.  You have a fever.  You have new or severe problems.  You have body aches, watery poop (diarrhea), or you throw up (vomit).  Your rash is not better after 3 days. MAKE SURE YOU:   Understand these instructions.  Will watch your condition.  Will get help right away if you are not doing well or get worse. Document Released: 12/19/2007 Document Revised: 09/24/2011 Document Reviewed: 04/16/2011 [facility] Patient Information 2015 [place], [place]. This information is not intended to replace advice given to you by your health care provider. Make sure you discuss any questions you have with your health care provider.  Pruritus  Pruritis is an itch. There are many different problems that can cause an itch. Dry skin is one of the most common causes of itching. Most cases of itching do not require medical attention.  HOME CARE INSTRUCTIONS  Make sure your skin is moistened on a regular basis. A moisturizer that contains petroleum jelly is best for keeping moisture in your skin. If you develop a rash, you may try the following for relief:   Use corticosteroid cream.  Apply cool compresses to the affected areas.  Bathe with Epsom salts or baking soda in the bathwater.  Soak in colloidal oatmeal baths. These are available at your pharmacy.  Apply baking soda paste to the rash. Stir water into baking soda until it reaches a paste-like consistency.  Use an anti-itch lotion.  Take over-the-counter diphenhydramine medicine by mouth as the instructions  direct.  Avoid scratching. Scratching may cause the rash to become infected. If itching is very bad, your caregiver may suggest prescription lotions or creams to lessen your symptoms.  Avoid hot showers, which can make itching worse. A cold shower may help with itching as long as you use a moisturizer after the shower. SEEK MEDICAL CARE IF: The itching does not go away after several days. Document Released: 03/14/2011 Document Revised: 09/24/2011 Document Reviewed: 03/14/2011 [facility] Patient Information 2015 [place], [place]. This information is not intended to replace advice given to you by your health care provider. Make sure you discuss any questions you have with your health care provider.

## 2014-01-09 NOTE — ED Provider Notes (Signed)
  Face-to-face evaluation   History: She complains of rash for 2 weeks. It started on her face. She has used multiple medications and is currently taking hydroxyzine, without relief.  Physical exam: Alert, calm, cooperative, scratching. Vague red, raised rash around the eyes, antecubital spaces and on the thighs bilaterally.  Medical screening examination/treatment/procedure(s) were conducted as a shared visit with non-physician practitioner(s) and myself.  I personally evaluated the patient during the encounter  Richarda Blade, MD 01/10/14 [phone]

## 2014-01-09 NOTE — Progress Notes (Signed)
I saw and evaluated the patient.  I personally confirmed the key portions of the history and exam documented by Dr. Algis Liming and I reviewed pertinent patient test results.  The assessment, diagnosis, and plan were formulated together and I agree with the documentation in the resident's note.

## 2014-01-13 ENCOUNTER — Ambulatory Visit: Payer: No Typology Code available for payment source | Admitting: Internal Medicine

## 2014-01-16 ENCOUNTER — Encounter (HOSPITAL_COMMUNITY): Payer: Self-pay | Admitting: Emergency Medicine

## 2014-01-16 ENCOUNTER — Emergency Department (HOSPITAL_COMMUNITY)
Admission: EM | Admit: 2014-01-16 | Discharge: 2014-01-16 | Disposition: A | Discharge status: Home / Self Care | Payer: No Typology Code available for payment source | Attending: Emergency Medicine | Admitting: Emergency Medicine

## 2014-01-16 DIAGNOSIS — Z8719 Personal history of other diseases of the digestive system: Secondary | ICD-10-CM | POA: Insufficient documentation

## 2014-01-16 DIAGNOSIS — Z79899 Other long term (current) drug therapy: Secondary | ICD-10-CM | POA: Insufficient documentation

## 2014-01-16 DIAGNOSIS — Z9889 Other specified postprocedural states: Secondary | ICD-10-CM | POA: Insufficient documentation

## 2014-01-16 DIAGNOSIS — E119 Type 2 diabetes mellitus without complications: Secondary | ICD-10-CM | POA: Insufficient documentation

## 2014-01-16 DIAGNOSIS — M129 Arthropathy, unspecified: Secondary | ICD-10-CM | POA: Insufficient documentation

## 2014-01-16 DIAGNOSIS — L538 Other specified erythematous conditions: Secondary | ICD-10-CM

## 2014-01-16 DIAGNOSIS — IMO0002 Reserved for concepts with insufficient information to code with codable children: Secondary | ICD-10-CM | POA: Insufficient documentation

## 2014-01-16 DIAGNOSIS — Z794 Long term (current) use of insulin: Secondary | ICD-10-CM | POA: Insufficient documentation

## 2014-01-16 DIAGNOSIS — L259 Unspecified contact dermatitis, unspecified cause: Secondary | ICD-10-CM | POA: Insufficient documentation

## 2014-01-16 DIAGNOSIS — Z87828 Personal history of other (healed) physical injury and trauma: Secondary | ICD-10-CM | POA: Insufficient documentation

## 2014-01-16 DIAGNOSIS — L309 Dermatitis, unspecified: Secondary | ICD-10-CM

## 2014-01-16 LAB — COMPREHENSIVE METABOLIC PANEL
ALT: 24 U/L (ref 0–35)
AST: 25 U/L (ref 0–37)
Albumin: 3.3 g/dL — ABNORMAL LOW (ref 3.5–5.2)
Alkaline Phosphatase: 110 U/L (ref 39–117)
Anion gap: 14 (ref 5–15)
BUN: 9 mg/dL (ref 6–23)
CO2: 22 mEq/L (ref 19–32)
Calcium: 9 mg/dL (ref 8.4–10.5)
Chloride: 99 mEq/L (ref 96–112)
Creatinine, Ser: 0.66 mg/dL (ref 0.50–1.10)
GFR calc Af Amer: >90 mL/min (ref >90)
GFR calc non Af Amer: >90 mL/min (ref >90)
Glucose, Bld: 447 mg/dL — ABNORMAL HIGH (ref 70–99)
Potassium: 4.4 mEq/L (ref 3.7–5.3)
Sodium: 135 mEq/L — ABNORMAL LOW (ref 137–147)
Total Bilirubin: 0.2 mg/dL — ABNORMAL LOW (ref 0.3–1.2)
Total Protein: 7.1 g/dL (ref 6.0–8.3)

## 2014-01-16 MED ORDER — PREDNISONE 20 MG PO TABS: 40.0000 mg | ORAL_TABLET | Freq: Every day | ORAL | Status: DC

## 2014-01-16 MED ORDER — DEXAMETHASONE SODIUM PHOSPHATE 10 MG/ML IJ SOLN
10.0000 mg | Freq: Once | INTRAMUSCULAR | Status: AC
  Administered 2014-01-16: 10 mg via INTRAMUSCULAR
  Filled 2014-01-16: qty 1

## 2014-01-16 MED ORDER — HYDROXYZINE HCL 50 MG PO TABS: 50.0000 mg | ORAL_TABLET | ORAL | Status: DC | PRN

## 2014-01-16 NOTE — ED Notes (Signed)
Pt c/o itchy rash to arms, hands, neck and back. Pt was seen for same last Sat. Pt is taking hydroxyzine and hydrocortisone creme for itching, but it is not helping much. Pt has no acute distress.

## 2014-01-16 NOTE — Discharge Instructions (Signed)

## 2014-01-16 NOTE — ED Provider Notes (Signed)
CSN: [id number]     Arrival date & time 01/16/14  2048 History  This chart was scribed for Antonietta Breach, PA-C working with Threasa Beards, MD by Randa Evens, ED Scribe. This patient was seen in room WTR8/WTR8 and the patient's care was started at 9:21 PM.    Chief Complaint  Patient presents with  . Rash   Patient is a 53 y.o. female presenting with rash. The history is provided by the patient. No language interpreter was used.  Rash Associated symptoms: no abdominal pain, no fever, no nausea, no shortness of breath, not vomiting and not wheezing    HPI Comments: [name] is a 53 y.o. female who presents to the Emergency Department complaining of rash on arms, hands, neck and back onset 3 weeks prior. She states she has associated visual disturbance. She states the the rash is constantly itchy. She states she has tried several creams with no relief. She states that she received hydroxyzine in the ED that provides some relief for about 2 hours. She states [place] got [place]  She denies fever, trouble swallowing, SOB, wheezing.   Past Medical History  Diagnosis Date  . Adhesive capsulitis of right shoulder     with underlying tendinopathy rotator cuff  . History of post-sterilization tuboplasty 2000  . Tear of meniscus of left knee     x2  . Tear of meniscus of right knee   . Plantar fasciitis     Right  . Shortness of breath     with exertion  . Diabetes mellitus     oral tx  . GERD (gastroesophageal reflux disease)   . Arthritis   . Sleep apnea 5 plus yrs    study -pt could not sleep test inconclusive.    Past Surgical History  Procedure Laterality Date  . Cholecystectomy    . Meniscus repair      R and L (L x2)  . Tonsillectomy    . Knee arthroscopy  10/10/2011    Procedure: ARTHROSCOPY KNEE;  Surgeon: Newt Minion, MD;  Location: [facility];  Service: Orthopedics;  Laterality: Right;  Right Knee Arthroscopy and Excision Medial  Meniscus  . Tubal ligation     Family History  Problem Relation Age of Onset  . Cancer Sister     both sisters have breast cancer   History  Substance Use Topics  . Smoking status: Never Smoker   . Smokeless tobacco: Never Used  . Alcohol Use: No   OB History   Grav Para Term Preterm Abortions TAB SAB Ect Mult Living                 Review of Systems  Constitutional: Negative for fever.  HENT: Negative for trouble swallowing.   Eyes: Positive for visual disturbance.  Respiratory: Negative for shortness of breath and wheezing.   Gastrointestinal: Negative for nausea, vomiting and abdominal pain.  Skin: Positive for rash.  All other systems reviewed and are negative.   Allergies  Ofloxacin  Home Medications   Prior to Admission medications   Medication Sig Start Date End Date Taking? Authorizing Provider  HYDROcodone-acetaminophen (NORCO) 10-325 MG per tablet Take 1 tablet by mouth 3 (three) times daily as needed for moderate pain. 12/25/13  Yes Sid Falcon, MD  hydrocortisone cream 0.5 % Apply 1 application topically 2 (two) times daily as needed for itching.   Yes Historical Provider, MD  insulin glargine (LANTUS) 100  UNIT/ML injection Inject 15 units at bedtime   Yes Na Nicoletta Dress, MD  Liraglutide (VICTOZA [place]) Inject 1.8 mg into the skin at bedtime.    Yes Historical Provider, MD  traMADol (ULTRAM) 50 MG tablet Take 50 mg by mouth every 6 (six) hours as needed for moderate pain.   Yes Historical Provider, MD  cyproheptadine (PERIACTIN) 4 MG tablet Take 1 tablet (4 mg total) by mouth 3 (three) times daily as needed for allergies. 01/09/14   Noland Fordyce, PA-C  famotidine (PEPCID) 20 MG tablet Take 1 tablet (20 mg total) by mouth 2 (two) times daily. 01/09/14   Noland Fordyce, PA-C  hydrOXYzine (ATARAX/VISTARIL) 50 MG tablet Take 1 tablet (50 mg total) by mouth every 4 (four) hours as needed for itching. 01/16/14   Antonietta Breach, PA-C  LORazepam (ATIVAN) 1 MG tablet Take 1 tablet (1 mg  total) by mouth every 6 (six) hours as needed for anxiety or sleep. 01/09/14   Noland Fordyce, PA-C  predniSONE (DELTASONE) 20 MG tablet Take 2 tablets (40 mg total) by mouth daily. 01/16/14   Antonietta Breach, PA-C   Triage Vitals: BP 129/73  Pulse 90  Temp(Src) 98.8 F (37.1 C) (Oral)  Resp 16  SpO2 96%  LMP 12/23/2013  Physical Exam  Nursing note and vitals reviewed. Constitutional: She is oriented to person, place, and time. She appears well-developed and well-nourished. No distress.  HENT:  Head: Normocephalic and atraumatic.  Mouth/Throat: Oropharynx is clear and moist. No oropharyngeal exudate.  No angioedema. No uvula swelling. Patient tolerating secretions without difficulty. No oral lesions.  Eyes: Conjunctivae and EOM are normal. No scleral icterus.  Neck: Normal range of motion. Neck supple.  No stridor  Cardiovascular: Normal rate, regular rhythm and intact distal pulses.   Pulmonary/Chest: Effort normal. No stridor. No respiratory distress. She has no wheezes.  No tachypnea or dyspnea. No retractions or accessory muscle use.  Musculoskeletal: Normal range of motion.  Neurological: She is alert and oriented to person, place, and time.  GCS 15. Patient moves her extremities without ataxia.  Skin: Skin is warm and dry. Rash noted. She is not diaphoretic. No erythema. No pallor.  Mildly erythematous, pruritic, maculopapular rash appreciated to bilateral antecubital fossa as well as mildly scattered on bilateral cheeks. Rash also appreciated to proximal right inner thigh.  Psychiatric: She has a normal mood and affect. Her behavior is normal.    ED Course  Procedures (including critical care time) DIAGNOSTIC STUDIES: Oxygen Saturation is 96% on RA, normal by my interpretation.    COORDINATION OF CARE:  Labs Review Labs Reviewed  COMPREHENSIVE METABOLIC PANEL - Abnormal; Notable for the following:    Sodium 135 (*)    Glucose, Bld 447 (*)    Albumin 3.3 (*)    Total  Bilirubin 0.2 (*)    All other components within normal limits  B. BURGDORFI ANTIBODIES    Imaging Review No results found.   EKG Interpretation None      MDM   Final diagnoses:  Dermatitis   Uncomplicated dermatitis x3 weeks. Patient has tried hydroxyzine, Ativan, prednisone, Pepcid, Periactin, and over-the-counter treatments with no relief. No evidence of airway compromise or respiratory distress. No associated fever. Patient does state that she has been experiencing persistent blurry vision since one week after onset of rash. Snellen 20/20 OS, OD and 20/15 OU today. No findings on previous fluorescein staining.  Labs drawn and reviewed which show no elevation of liver function or alkaline phosphatase. Lyme titer  pending. Patient endorses improvement after Decadron shot in ED. Will discharge with course of prednisone and hydroxyzine which patient states has been giving her mild, temporary relief. Dermatology followup stressed and return precautions provided. Patient agreeable to plan with no unaddressed concerns.  I personally performed the services described in this documentation, which was scribed in my presence. The recorded information has been reviewed and is accurate.  Filed Vitals:   01/16/14 2104  BP: 129/73  Pulse: 90  Temp: 98.8 F (37.1 C)  TempSrc: Oral  Resp: 16  SpO2: 96%        Antonietta Breach, PA-C 01/17/14 0009

## 2014-01-17 NOTE — ED Provider Notes (Signed)
Medical screening examination/treatment/procedure(s) were performed by non-physician practitioner and as supervising physician I was immediately available for consultation/collaboration.   EKG Interpretation None       Threasa Beards, MD 01/17/14 1511

## 2014-01-18 ENCOUNTER — Encounter: Payer: Self-pay | Admitting: Internal Medicine

## 2014-01-18 ENCOUNTER — Ambulatory Visit (INDEPENDENT_AMBULATORY_CARE_PROVIDER_SITE_OTHER): Payer: No Typology Code available for payment source | Admitting: Internal Medicine

## 2014-01-18 VITALS — BP 127/82 | HR 92 | Wt 206.8 lb

## 2014-01-18 DIAGNOSIS — M549 Dorsalgia, unspecified: Secondary | ICD-10-CM

## 2014-01-18 DIAGNOSIS — E1165 Type 2 diabetes mellitus with hyperglycemia: Secondary | ICD-10-CM

## 2014-01-18 DIAGNOSIS — R21 Rash and other nonspecific skin eruption: Secondary | ICD-10-CM

## 2014-01-18 DIAGNOSIS — IMO0001 Reserved for inherently not codable concepts without codable children: Secondary | ICD-10-CM

## 2014-01-18 DIAGNOSIS — L538 Other specified erythematous conditions: Secondary | ICD-10-CM

## 2014-01-18 DIAGNOSIS — G8929 Other chronic pain: Secondary | ICD-10-CM

## 2014-01-18 DIAGNOSIS — L299 Pruritus, unspecified: Secondary | ICD-10-CM | POA: Insufficient documentation

## 2014-01-18 LAB — B. BURGDORFI ANTIBODIES: B burgdorferi Ab IgG+IgM: 0.86 {ISR}

## 2014-01-18 LAB — GLUCOSE, CAPILLARY: Glucose-Capillary: 322 mg/dL — ABNORMAL HIGH (ref 70–99)

## 2014-01-18 MED ORDER — PERMETHRIN 1 % EX LOTN: TOPICAL_LOTION | CUTANEOUS | Status: DC

## 2014-01-18 MED ORDER — PERMETHRIN 1 % EX LOTN
TOPICAL_LOTION | CUTANEOUS | Status: DC
Start: 2014-01-18 — End: 2014-01-18

## 2014-01-18 MED ORDER — TRAMADOL HCL 50 MG PO TABS: 50.0000 mg | ORAL_TABLET | Freq: Four times a day (QID) | ORAL | Status: DC | PRN

## 2014-01-18 MED ORDER — PERMETHRIN 1 % EX LOTN: TOPICAL_LOTION | CUTANEOUS | Status: AC

## 2014-01-18 MED ORDER — HYDROCODONE-ACETAMINOPHEN 10-325 MG PO TABS: 1.0000 | ORAL_TABLET | Freq: Three times a day (TID) | ORAL | Status: DC | PRN

## 2014-01-18 NOTE — Assessment & Plan Note (Addendum)
Patient has chronic back pain and fibromyalgia and has been taking Tramadol and Vicodin per current pain contract.  She is written for Vicodin 10-325 TID per FYI note from Dr. Nicoletta Dress, but only received prescription for 80 pills and is out as a result, last written on 12/25/13. -Refilled Vicodin 10-325 mg TID #90 tabs and tramadol 50 mg 2 tabs Q8H #180 tabs.

## 2014-01-18 NOTE — Patient Instructions (Addendum)
Thank you for coming to clinic today [name].  General instructions: -It is possible that your rash and itchiness may be due to scabies that you may have contracted during your hotel stay a few weeks ago. Both you and your husband will need to be treated to eliminate the mites that cause this rash. -Apply permethrin cream to all area of your body from the neck down.  Leave it on for 8-14 hours then wash it off.  Have your husband do the same. -Repeat this after one week to make sure all of the mites are gone. -Wash your linens and clothes in hot water and dry in a hot dryer to kill the mites that may have transferred to these items.  Alternatively, you can put your clothes in a bag for several days. -Continue the prednisone, Periactin, Pepcid, and hyrdoxyzine for now. -If the itching improves, you can taper your prednisone by decreasing to 40 mg for three days, then 20 mg for three days, then stop. -Follow your blood sugar closely and modify your Lantus and Novolog accordingly.  You will need less insulin as you decrease your steroid intake. -We will process your dermatology referral in case the rash does not improve.  Follow up with [name] in 2 weeks to see how your rash and itchiness is doing.  Please try to bring all your medicines next time. This helps [name] take good care of you and stops mistakes from medicines that could hurt you.  Permethrin skin cream What is this medicine? PERMETHRIN (per METH rin) skin cream is used to treat scabies. This medicine may be used for other purposes; ask your health care provider or pharmacist if you have questions. COMMON BRAND NAME(S): Acticin, Elimite What should I tell my health care provider before I take this medicine? They need to know if you have any of these conditions: -asthma -an unusual or allergic reaction to permethrin, veterinary or household insecticides, other medicines, chrysanthemums, foods, dyes, or preservatives -pregnant or trying  to get pregnant -breast-feeding How should I use this medicine? This medicine is for external use only. Do not take by mouth. Follow the directions on the prescription label. A bath or shower is NOT recommended before applying this medicine. Thoroughly rub the cream into all skin surfaces, from your head to the soles of your feet. It is important to apply it everywhere on your body, not just where the rash is. Apply the cream between fingers and toe creases, in the folds of the wrist and waistline, in the cleft of the buttocks, on the genitals, and in the belly button. Use a toothpick to apply the cream beneath your fingernails and toenails. Nails should be cut short. If you have little or no hair, or you are applying the cream to an infant or young child, make sure you rub the cream into the neck, scalp, hairline, temples, and forehead. Leave it on for 8 to 14 hours, then remove it by bathing and shampooing. If you are applying this medicine to another person, wear plastic or disposable gloves to protect yourself from infestation. Do not get this medicine in your eyes. If you do, rinse out with plenty of cool tap water. Talk to your pediatrician regarding the use of this medicine in children. While this drug may be prescribed for children as young as 15 months of age for selected conditions, precautions do apply. Overdosage: If you think you have taken too much of this medicine contact a poison control center  or emergency room at once. NOTE: This medicine is only for you. Do not share this medicine with others. What if I miss a dose? This does not apply. What may interact with this medicine? Interactions are not expected. Do not use any other skin products on the affected area without telling your doctor or health care professional. This list may not describe all possible interactions. Give your health care provider a list of all the medicines, herbs, non-prescription drugs, or dietary supplements you  use. Also tell them if you smoke, drink alcohol, or use illegal drugs. Some items may interact with your medicine. What should I watch for while using this medicine? It is not unusual for itching and rash to continue for as long as 2 to 4 weeks after treatment. These symptoms may be a temporary reaction to the remains of the mites. This does not mean this cream did not work or that it needs to be reapplied. If you feel that the itching and rash is intense or if it continues beyond 4 weeks, talk to your doctor or health care professional right away. Scabies is spread by direct skin contact with an infected person. Family members and sexual partners may require treatment with this medicine. You should discuss this with your doctor or health care professional. Using a normal washing cycle, you should wash all clothing, towels and bed linen that has touched your skin. You do not need to rewash clean clothing that has not yet been worn. Coats, furniture, rugs, floors, and walls do not need to be cleaned in any special manner. What side effects may I notice from receiving this medicine? Side effects that usually do not require medical attention (report to your doctor or health care professional if they continue or are bothersome): -itching -numbness -rash -redness or mild swelling of the skin -stinging or burning -tingling sensation This list may not describe all possible side effects. Call your doctor for medical advice about side effects. You may report side effects to FDA at 1-800-FDA-1088. Where should I keep my medicine? Keep out of the reach of children. Store at room temperature away from heat and direct light. Do not refrigerate or freeze. Throw away any unused medicine after the expiration date. NOTE: This sheet is a summary. It may not cover all possible information. If you have questions about this medicine, talk to your doctor, pharmacist, or health care provider.  2015, [employer]. (2008-01-29 14:02:14)

## 2014-01-18 NOTE — Assessment & Plan Note (Signed)
Rash initially started after trip to [place] and staying in a hotel bed.  No new exposures and spreads on touch contact to husband and legs.  Improved on prednisone and little rash currently, but extremely pruritic.  Although not typical for scabies and no lesions in finger folds or typical areas, the response to prednisone, spread via touch and persistence over several weeks suggest scabies could be possible and warrants treatment as referral to dermatology is processed. -Permethrin 1% lotion, apply to body and rinse off after 8-14 hours.  Repeat after one week.  Have close contacts, including husband to the same. -Wash clothes and linens in hot water to kill mites. -Continue prednisone 60 mg daily, cyproheptadine 4 mg TID PRN, Pepcid 20 mg BID, hydroxyzine 50 mg q4h for now. -Prednisone taper (40 mg three days, 20 mg three days) if rash and pruritis resolve with scabies treatment. -Has orange card, referral to dermatology in progress.

## 2014-01-18 NOTE — Assessment & Plan Note (Signed)
Rash near eyes not impressive currently and visual symptoms have improved.  Concern for cellulitis or other infection low at this point. -Referral to opthalmology in process. -Encouraged patient to avoid touching eyes with hands to stop possible spread of scabies. -Will follow up in 2 weeks.

## 2014-01-18 NOTE — Progress Notes (Signed)
Subjective:    Patient ID: [name], female    DOB: [birth year], 53 y.o.   MRN: [id number]  HPI Comments: Returns for follow up after being seen in the ER for rash.   The rash developed three weeks ago and started on her face and spread to chin and rest of body.  It originally started after she spent the night in a hotel in [place].  The rash is primarily on her arms and back of her hands currently and is extremely pruritic. She notes that her husband developed the same rash after she touched his arms.  Notably, she sleeps with her hands between her thighs and the rash spread to her thighs as well.  It improved initially after starting steroids but got worse after stopping the steroids, and she went to the ER where they started the steroids again.  She was last seen in our clinic on 01/06/14 when steroids were stopped.  After stopping prednisone, her rash came back and she was seen in the ER for the second time on 01/16/14 and was given decadron shot and started back on prednisone and hydroxyzine.  She is currently taking Periactin, Pepcid, hydroxyzine, and prednisone 60 mg daily. She says the rash has improved but she is still extremely pruritic.  She initially developed some blurry vision associated with the rash that has improved.  Denies new contacts, changing detergents, soaps, new medicines.  She received an orange card and is in the process of receiving a referral to dermatology.  Her blood sugar was 447 in the ER on 01/16/14, but she had just received a steroid injection and had eaten a candy bar.  This morning her glucose was 306 and went down to 150 after taking Novolog.  It was 322 in clinic today.  She has increased her Lantus dose to 40 units per day from 15 units to keep up with her increasing glucose levels.  She denies light headedness, frequent urination, or trouble breathing.  Rash Associated symptoms include fatigue. Pertinent negatives include no cough, eye pain, fever or  shortness of breath.      Review of Systems  Constitutional: Positive for fatigue. Negative for fever and activity change.  HENT: Positive for facial swelling.   Eyes: Positive for itching (Eye lids all external.). Negative for pain.  Respiratory: Negative for apnea, cough, choking, chest tightness and shortness of breath.   Cardiovascular: Negative for chest pain, palpitations and leg swelling.  Endocrine: Negative for cold intolerance, heat intolerance, polydipsia, polyphagia and polyuria.  Genitourinary: Negative for dysuria and frequency.  Musculoskeletal: Positive for arthralgias and myalgias.  Skin: Positive for rash.  Allergic/Immunologic: Negative for environmental allergies and food allergies.  Neurological: Negative for dizziness, light-headedness, numbness and headaches.  Psychiatric/Behavioral: Positive for agitation (Sleeping 2-3 hours per night.).       Objective:   Physical Exam  Constitutional: She is oriented to person, place, and time. She appears well-developed and well-nourished. No distress.  HENT:  Head: Normocephalic and atraumatic.  Very faint erythema on cheeks.  Eyes: Conjunctivae and EOM are normal. Pupils are equal, round, and reactive to light. Right eye exhibits no discharge. Left eye exhibits no discharge. No scleral icterus.  Neck: Normal range of motion. Neck supple.  Cardiovascular: Normal rate, regular rhythm, normal heart sounds and intact distal pulses.  Exam reveals no gallop and no friction rub.   No murmur heard. Pulmonary/Chest: Effort normal and breath sounds normal. No respiratory distress. She has no wheezes. She has  no rales. She exhibits no tenderness.  Abdominal: Soft. She exhibits no distension. There is no tenderness.  Musculoskeletal: She exhibits no edema and no tenderness.  Neurological: She is alert and oriented to person, place, and time. No cranial nerve deficit. Coordination normal.  Skin: Skin is warm and dry. Rash noted. She  is not diaphoretic. There is erythema.  Scratches on bilateral forearms and back of hands. Few erythematous papules on forearms.  Resolving papules on inner thighs bilaterally.  Psychiatric: She has a normal mood and affect.          Assessment & Plan:  Please see problem-based assessment and plan.

## 2014-01-18 NOTE — Assessment & Plan Note (Signed)
Blood glucose has been running high since starting prednisone and receiving Decadron shots.  She is keeping up with glucose by increasing Lantus dose and using Novolog sliding scale.  Glucose 322 in clinic today after drinking a smoothie, but reports keeping glucose in low 100s at home. -Continue Lantus at 40 units daily while on prednisone. -Continue Novolog sliding scale. -Instructed patient to decrease Lantus dose when tapering prednisone in the future. -Instructed patient to taper prednisone if itching and rash improves after scabies treatment.

## 2014-01-19 NOTE — Progress Notes (Signed)
I saw and evaluated the patient.  I personally confirmed the key portions of the history and exam documented by Dr. Moding and I reviewed pertinent patient test results.  The assessment, diagnosis, and plan were formulated together and I agree with the documentation in the resident's note. 

## 2014-02-01 ENCOUNTER — Telehealth: Payer: Self-pay | Admitting: *Deleted

## 2014-02-01 DIAGNOSIS — G8929 Other chronic pain: Secondary | ICD-10-CM

## 2014-02-01 DIAGNOSIS — M549 Dorsalgia, unspecified: Principal | ICD-10-CM

## 2014-02-01 MED ORDER — TRAMADOL HCL 50 MG PO TABS: 100.0000 mg | ORAL_TABLET | Freq: Three times a day (TID) | ORAL | Status: DC | PRN

## 2014-02-01 NOTE — Telephone Encounter (Signed)
Pt calls and c/o not being able to get all of her tramadol due to directions being written wrong, normally directions are "take 2 tablets every 8 hrs ". Please review and she would need a script for another 60 to make the 180/month. Please advise

## 2014-02-01 NOTE — Telephone Encounter (Signed)
I put in phone in order for #60 2 q 8 hrs prn.

## 2014-02-02 NOTE — Telephone Encounter (Signed)
Called to pharm, left pt message to rtc for information

## 2014-02-05 ENCOUNTER — Ambulatory Visit: Payer: No Typology Code available for payment source | Admitting: Family Medicine

## 2014-02-05 ENCOUNTER — Other Ambulatory Visit: Payer: Self-pay | Admitting: *Deleted

## 2014-02-05 DIAGNOSIS — L538 Other specified erythematous conditions: Secondary | ICD-10-CM

## 2014-02-05 DIAGNOSIS — R21 Rash and other nonspecific skin eruption: Secondary | ICD-10-CM

## 2014-02-08 ENCOUNTER — Ambulatory Visit: Payer: No Typology Code available for payment source | Admitting: Family Medicine

## 2014-02-08 ENCOUNTER — Ambulatory Visit: Payer: No Typology Code available for payment source | Admitting: Internal Medicine

## 2014-02-08 MED ORDER — FAMOTIDINE 20 MG PO TABS: 20.0000 mg | ORAL_TABLET | Freq: Two times a day (BID) | ORAL | Status: DC

## 2014-02-08 MED ORDER — CYPROHEPTADINE HCL 4 MG PO TABS: 4.0000 mg | ORAL_TABLET | Freq: Three times a day (TID) | ORAL | Status: DC | PRN

## 2014-02-08 MED ORDER — HYDROXYZINE HCL 50 MG PO TABS: 50.0000 mg | ORAL_TABLET | ORAL | Status: DC | PRN

## 2014-02-08 NOTE — Telephone Encounter (Signed)
Patient was scheduled for an appointment today and did not show up.  She is requesting refills on medications prescribed for her rash and pruritis.  She needs to be seen to evaluate whether her rash has resolved and if Ativan should be continued.

## 2014-02-08 NOTE — Telephone Encounter (Signed)
Rx called into [facility]. Front dest to schedule appointment

## 2014-02-15 ENCOUNTER — Ambulatory Visit: Payer: No Typology Code available for payment source | Admitting: Family Medicine

## 2014-02-19 ENCOUNTER — Encounter: Payer: No Typology Code available for payment source | Admitting: Internal Medicine

## 2014-02-24 ENCOUNTER — Other Ambulatory Visit: Payer: Self-pay | Admitting: *Deleted

## 2014-02-24 DIAGNOSIS — G8929 Other chronic pain: Secondary | ICD-10-CM

## 2014-02-24 DIAGNOSIS — M549 Dorsalgia, unspecified: Principal | ICD-10-CM

## 2014-03-01 MED ORDER — TRAMADOL HCL 50 MG PO TABS: 100.0000 mg | ORAL_TABLET | Freq: Three times a day (TID) | ORAL | Status: DC | PRN

## 2014-03-01 MED ORDER — HYDROCODONE-ACETAMINOPHEN 10-325 MG PO TABS: 1.0000 | ORAL_TABLET | Freq: Three times a day (TID) | ORAL | Status: DC | PRN

## 2014-03-01 NOTE — Telephone Encounter (Signed)
Pain meds last filled on 01/18/14 for month's supply.  Refilled for one month, but patient will need to follow up before next refill.

## 2014-03-29 ENCOUNTER — Ambulatory Visit (INDEPENDENT_AMBULATORY_CARE_PROVIDER_SITE_OTHER): Payer: No Typology Code available for payment source | Admitting: Family Medicine

## 2014-03-29 ENCOUNTER — Encounter: Payer: Self-pay | Admitting: Internal Medicine

## 2014-03-29 ENCOUNTER — Ambulatory Visit (INDEPENDENT_AMBULATORY_CARE_PROVIDER_SITE_OTHER): Payer: No Typology Code available for payment source | Admitting: Internal Medicine

## 2014-03-29 ENCOUNTER — Encounter: Payer: Self-pay | Admitting: Family Medicine

## 2014-03-29 VITALS — BP 117/65 | Ht 62.0 in | Wt 207.0 lb

## 2014-03-29 VITALS — BP 140/81 | HR 75 | Temp 98.2°F | Wt 207.0 lb

## 2014-03-29 DIAGNOSIS — M549 Dorsalgia, unspecified: Secondary | ICD-10-CM

## 2014-03-29 DIAGNOSIS — G8929 Other chronic pain: Secondary | ICD-10-CM

## 2014-03-29 DIAGNOSIS — E1165 Type 2 diabetes mellitus with hyperglycemia: Principal | ICD-10-CM

## 2014-03-29 DIAGNOSIS — Z0000 Encounter for general adult medical examination without abnormal findings: Secondary | ICD-10-CM

## 2014-03-29 DIAGNOSIS — M722 Plantar fascial fibromatosis: Secondary | ICD-10-CM

## 2014-03-29 DIAGNOSIS — IMO0001 Reserved for inherently not codable concepts without codable children: Secondary | ICD-10-CM

## 2014-03-29 DIAGNOSIS — R21 Rash and other nonspecific skin eruption: Secondary | ICD-10-CM

## 2014-03-29 DIAGNOSIS — N946 Dysmenorrhea, unspecified: Secondary | ICD-10-CM

## 2014-03-29 DIAGNOSIS — Z23 Encounter for immunization: Secondary | ICD-10-CM

## 2014-03-29 LAB — GLUCOSE, CAPILLARY: Glucose-Capillary: 213 mg/dL — ABNORMAL HIGH (ref 70–99)

## 2014-03-29 LAB — POCT GLYCOSYLATED HEMOGLOBIN (HGB A1C): Hemoglobin A1C: 10.5

## 2014-03-29 MED ORDER — FAMOTIDINE 20 MG PO TABS: 20.0000 mg | ORAL_TABLET | Freq: Two times a day (BID) | ORAL | Status: DC

## 2014-03-29 MED ORDER — HYDROCODONE-ACETAMINOPHEN 10-325 MG PO TABS: 1.0000 | ORAL_TABLET | Freq: Three times a day (TID) | ORAL | Status: DC | PRN

## 2014-03-29 MED ORDER — SITAGLIPTIN PHOSPHATE 100 MG PO TABS: 100.0000 mg | ORAL_TABLET | Freq: Every day | ORAL | Status: DC

## 2014-03-29 MED ORDER — PREDNISONE (PAK) 10 MG PO TABS: ORAL_TABLET | Freq: Every day | ORAL | Status: DC

## 2014-03-29 MED ORDER — CETIRIZINE HCL 10 MG PO CHEW: 10.0000 mg | CHEWABLE_TABLET | Freq: Every day | ORAL | Status: DC

## 2014-03-29 MED ORDER — HYDROXYZINE HCL 50 MG PO TABS: 50.0000 mg | ORAL_TABLET | Freq: Four times a day (QID) | ORAL | Status: DC | PRN

## 2014-03-29 MED ORDER — TRAMADOL HCL 50 MG PO TABS: 100.0000 mg | ORAL_TABLET | Freq: Three times a day (TID) | ORAL | Status: DC | PRN

## 2014-03-29 NOTE — Assessment & Plan Note (Addendum)
Lab Results  Component Value Date   HGBA1C 10.5 03/29/2014   HGBA1C 8.7 12/29/2013   HGBA1C 9.3 09/15/2013     Assessment: Diabetes control:  Not controlled Progress toward A1C goal:   Not at goal Comments: She is on Lantus 15 units qHS with CBGs in 120s in am before breakfast. She is NOT on SSI. She did not tolerated glipizide (gained weight with this) or metformin (GI sx). She wants to try Januvia--states that she knows of a discount program for this medication and she can get it for $5 per month.   Plan: Medications:  tapering off prednisone, increase Lantus to 17 units qHS add Januvia. BS check 3 times daily including before breakfast. Patient understands potential for hypoglycemia with combination of Januvia and Lantus and will call us and let us know if she has low BS.  Home glucose monitoring: Frequency:   Timing:   Instruction/counseling given: reminded to bring blood glucose meter & log to each visit, discussed the need for weight loss and discussed diet Educational resources provided:   Self management tools provided:   Other plans: Follow up in 1 month with PCP

## 2014-03-29 NOTE — Assessment & Plan Note (Signed)
Refilled Norco 10-325mg  q8hr PRN for pain #90 Refilled Tramadol 50mg  q8hr PRN for pain #180  Pt advised to follow up with her new PCP, Dr. Trudee Kuster to request 3 month refills

## 2014-03-29 NOTE — Progress Notes (Signed)
   Subjective:    Patient ID: [name], female    DOB: 1961/05/15, 53 y.o.   MRN: [id number]  HPI [name] is a 54 year old woman with PMH of HLP, DM2, fibromyalgia, chronic back pain, who presents for follow up visit for diabetes and a rash that has been present for 2 months now. The rash started after she stayed in a hotel in [place], it is present on her arms and back and has improved somewhat with Pecpic, hydroxyzine, and prednisone use. She has an appointment with Dermatology this week for further evaluation of the rash.  She ran out of prednisone yesterday. She has stopped prednisone 40mg  abruptly in the past and developed flu-like symptoms. She has been on prednisone for months now.   She has chronic back pain for which she takes Vicodin and Tramadol. She is seen at Sports Medicine for chronic plantar fasciitis.  She has dysmenorrhea but is now perimenopausal with hot flashes and irregular periods and is interested in Depo=prover shots.    Review of Systems  Constitutional: Negative for fever, chills, diaphoresis, activity change, appetite change and fatigue.  Respiratory: Negative for cough, shortness of breath and wheezing.   Cardiovascular: Negative for chest pain, palpitations and leg swelling.  Genitourinary: Negative for dysuria.  Skin: Positive for rash.  Neurological: Negative for dizziness and light-headedness.  Psychiatric/Behavioral: Negative for agitation.       Objective:   Physical Exam  Nursing note and vitals reviewed. Constitutional: She is oriented to person, place, and time. She appears well-developed and well-nourished. No distress.  Eyes: Conjunctivae are normal. No scleral icterus.  Cardiovascular: Normal rate.   Pulmonary/Chest: Effort normal. No respiratory distress.  Musculoskeletal: She exhibits no edema and no tenderness.  Neurological: She is alert and oriented to person, place, and time.  Skin: Skin is warm and dry. Rash noted. She  is not diaphoretic.  Faint erythema of face over eyelids, bilateral hands and arms that is diffuse but more noticeable in the medial/dorsal hands. No skin break, vesicles/papules  Psychiatric: She has a normal mood and affect.          Assessment & Plan:

## 2014-03-29 NOTE — Assessment & Plan Note (Signed)
She has painful periods and is now perimenopausal with her periods becoming irregular.  Referred to GYN, pt may need further evaluation before getting Depo shots

## 2014-03-29 NOTE — Patient Instructions (Signed)
General Instructions: -You need to be tapered off prednisone, follow the instruction on your prescription, do not stop this "cold [place]".  -Start taking Januvia, increase Lantus to 17 units at night. Start checking your blood sugars 3 times per day, before breakfast and after meals.  -Follow up in 1 month to discuss diabetes. Bring the stool cards back before then.   Please bring your medicines with you each time you come.   Medicines may be  Eye drops  Herbal   Vitamins  Pills  Seeing these help [name] take care of you.    Treatment Goals:  Goals (1 Years of Data) as of 03/29/14         As of Today 12/29/13 09/15/13 06/15/13 02/26/13     Result Component    . HEMOGLOBIN A1C < 6.5  10.5 8.7 9.3 7.9 10.9    . LDL CALC < 100     71     . LDL CALC < 100     71       Progress Toward Treatment Goals:  Treatment Goal 03/29/2014  Hemoglobin A1C deteriorated    Self Care Goals & Plans:  Self Care Goal 03/29/2014  Manage my medications take my medicines as prescribed; bring my medications to every visit; refill my medications on time  Monitor my health bring my glucose meter and log to each visit; keep track of my blood glucose; keep track of my weight  Eat healthy foods -  Be physically active take a walk every day  Meeting treatment goals maintain the current self-care plan    Home Blood Glucose Monitoring 03/29/2014  Check my blood sugar once a day  When to check my blood sugar before breakfast     Care Management & Community Referrals:  Referral 03/29/2014  Referrals made for care management support none needed

## 2014-03-29 NOTE — Assessment & Plan Note (Signed)
Refilled prednisone with taper every 4 days of 40mg , 30mg , 20mg , 10mg , and 5mg ,.  Patient instructed to never stop prednisone "cold [place]" to prevent acute adrenal insufficiency.  Refilled hydroxyzine, Pepcid, prescribed Zyrtec.  Pt encouraged to follow up with Dermatology on Friday.

## 2014-03-29 NOTE — Progress Notes (Signed)
Patient ID: [name], female   DOB: [birth year], 53 y.o.   MRN: [id number]  Advised Dr Hayes Ludwig to not increase Lantus and add Januvia at the same visit - Category D risk drug interaction - there could be risk of hypoglycemia, especially since the patient has not shown [name] her blood glucose log. Novolog mealtime would be a better suited option for her, though I understand, per Dr Bernadene Bell report that the patient does not want this option at this time. The patient should be encouraged to bring her glucometer next time, and also imparted education about hypoglycemia symptoms.   Case discussed with Dr. Hayes Ludwig soon after the resident saw the patient.  We reviewed the resident's history and exam and pertinent patient test results.  I agree with the assessment, diagnosis, and plan of care documented in the resident's note.

## 2014-03-29 NOTE — Assessment & Plan Note (Signed)
Has been following with Sports Medicine but needs surgery.  Referred to Orthopedic Surgery.

## 2014-03-29 NOTE — Assessment & Plan Note (Addendum)
She is due for colonoscopy, mammogram, and PAP smear.  Referred to GYN where she will get PAP smear.  Received flu vaccine Scheduled mammogram Was given stool cards

## 2014-03-30 NOTE — Progress Notes (Signed)
   Subjective:    Patient ID: [name], female    DOB: 05-02-1961, 53 y.o.   MRN: [id number]  HPI  #1. Low back pain. Worse with standing long periods of time. Feels stiff. No regular exercise. #2. Right lateral foot pain that she's noticed in the last couple of weeks. No new activities. Worse with long periods of standing.  Review of Systems No fever, sweats, chills, unusual weight gain.    Objective:   Physical Exam  Vital signs are reviewed General: Well-developed overweight female no acute distress BACK: Slight asymmetry in the soft tissue secondary to known mild lumbar scoliosis but otherwise no defect. The vertebra are nontender to percussion. Her lumbar muscles are quite stiff and tender to palpation bilaterally. She has poor flexibility hip secondary to muscle stiffness in the low back in her hamstrings. Hyperextension is painless and full. Lateral rotation is full and painless. Straight leg raise negative bilaterally. Lower extremity strength 5 out of 5      Assessment & Plan:  Musculoskeletal low back pain. I gave her handout for low back exercises. #2. Foot pain. 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 We briefly looked at her lateral foot today with ultrasound and saw no evidence of any type of stress fracture. Her PCP was talking to her about going back and see in the foot doctor for further evaluation of the plantar fibromatosis and I think that'll be a great idea. She's her a had some orthotics made she wears good shoes so I don't think there is anything additional we can do at this point.

## 2014-04-02 ENCOUNTER — Ambulatory Visit (INDEPENDENT_AMBULATORY_CARE_PROVIDER_SITE_OTHER): Payer: No Typology Code available for payment source | Admitting: Family Medicine

## 2014-04-02 ENCOUNTER — Encounter: Payer: Self-pay | Admitting: Family Medicine

## 2014-04-02 ENCOUNTER — Other Ambulatory Visit: Payer: Self-pay | Admitting: Internal Medicine

## 2014-04-02 VITALS — BP 145/80 | HR 78 | Ht 62.0 in | Wt 207.0 lb

## 2014-04-02 DIAGNOSIS — E1165 Type 2 diabetes mellitus with hyperglycemia: Principal | ICD-10-CM

## 2014-04-02 DIAGNOSIS — IMO0001 Reserved for inherently not codable concepts without codable children: Secondary | ICD-10-CM

## 2014-04-02 DIAGNOSIS — M720 Palmar fascial fibromatosis [Dupuytren]: Secondary | ICD-10-CM

## 2014-04-02 MED ORDER — METHYLPREDNISOLONE ACETATE 40 MG/ML IJ SUSP
40.0000 mg | Freq: Once | INTRAMUSCULAR | Status: AC
  Administered 2014-04-02: 40 mg via INTRA_ARTICULAR

## 2014-04-02 NOTE — Progress Notes (Signed)
   Subjective:    Patient ID: [name], female    DOB: 09/24/1960, 53 y.o.   MRN: [id number]  HPI [name] is a 53 year old right-hand-dominant female who presents for followup of right hand pain. She has a history of right palmar Duptyrens contracture that has responded well to corticosteroid injection in the past, last done May 2015. She found several months of relief following the injection. Over the past one week she has noticed increasing pain in the palmar aspect that has been gradually worsening. 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 Rest relieves her symptoms. She denies any associated swelling, rash, fevers, chills, numbness, tingling, or new injury.  Past medical, social, medications, and allergies were reviewed and are up-to-date in the chart. Review of Systems  6 point review of systems was performed is otherwise negative.    Objective:   Physical Exam BP 145/80  Pulse 78  Ht 5\' 2"  (1.575 m)  Wt 207 lb (93.895 kg)  BMI 37.85 kg/m2 GEN: The patient is well-developed well-nourished female and in no acute distress.  He is awake alert and oriented x3. SKIN: warm and well-perfused, no rash  EXTR: No upper extremity edema or calf tenderness Neuro: Strength 5/5 globally. Sensation intact throughout. No focal deficits. Vasc: +2 bilateral distal pulses. No edema.  MSK:  Examination of the patient's right hand reveals a contracture deformity on the palmar aspect with thickened fibrotic tissue in the subcutaneous region. There is mild to moderate tenderness to palpation with this, which is increased with passive extension of the fingers and palm. There is no surrounding erythema, induration, or warmth. She has good range of motion of the fingers without pain. She is distally neurovascularly intact.  Procedure: After obtaining informed, verbal consent the patient's skin was cleansed with alcohol and Betadine.  Subsequently the patient was injected  with 40 mg Depo-Medrol and 1cc of 1% lidocaine plain into the palmar subcutaneous facial layer. The patient tolerated procedure.  No complications.  Prior to the procedure risks benefits and treatment alternatives were discussed.    Assessment & Plan:  Please see problem based assessment and plan in the problem list.

## 2014-04-02 NOTE — Assessment & Plan Note (Signed)
-  Patient tolerated the injection into the contracture site of the palmar aspect of the right hand well today with reported improvement in symptoms. -We discussed that further surgical management as an option, but the patient wishes to continue conservative measures at this time. -She will continue to ice and take anti-inflammatories as needed for pain, and followup as needed.

## 2014-04-13 ENCOUNTER — Encounter: Payer: Self-pay | Admitting: *Deleted

## 2014-04-16 ENCOUNTER — Ambulatory Visit (INDEPENDENT_AMBULATORY_CARE_PROVIDER_SITE_OTHER): Payer: No Typology Code available for payment source | Admitting: Family Medicine

## 2014-04-16 ENCOUNTER — Encounter: Payer: Self-pay | Admitting: Family Medicine

## 2014-04-16 VITALS — BP 114/75 | HR 66 | Ht 62.0 in | Wt 207.0 lb

## 2014-04-16 DIAGNOSIS — M720 Palmar fascial fibromatosis [Dupuytren]: Secondary | ICD-10-CM

## 2014-04-16 NOTE — Patient Instructions (Signed)
I need you to call the financial advisor at [facility] on Monday to set a payment plan:  [phone] [address] [place] [address]  I will fax over all your notes today

## 2014-04-19 NOTE — Assessment & Plan Note (Signed)
Worsening symptoms and no relief her recent corticosteroid injection with her previous corticosteroid injection had been quite beneficial for her. Notably she also has plantar fibromatosis. She is also diabetic. At this point I think further corticosteroid injections or not been be beneficial in our like to have her seen by orthopedic hand specialist however she has no insurance so we'll try to set her up at one of the local universities who have to work on payment a plan with her.

## 2014-04-19 NOTE — Progress Notes (Signed)
   Subjective:    Patient ID: [name], female    DOB: 1961/01/30, 53 y.o.   MRN: [id number]  HPI Worsening problems with right hand stiffness, specifically the Duptyren's contracture. At last office visit we gave her a corticosteroid injection which has in the past helped her with the stiffness and locking sensation of her right hand. This injection seemed to be less beneficial. She's having increasing stiffness and difficulty using the hand.   Review of Systems No rash, no hand erythema, no fever, sweats, chills unusual weight loss.    Objective:   Physical Exam  Vital signs are reviewed GENERAL: Well-developed female no acute distress in HAND: Right. Very well-defined fibrous area in the palm specifically affecting the bring and long finger and potentially some association with the index finger. Very specific nodules are noted in the palmar portion. Skin: There is no erythema of the palm, no lesions. She is normal Refill VASCULAR: Radial pulse 2+ bilaterally equal NEURO: Soft touch sensation is intact bilateral hands.      Assessment & Plan:

## 2014-04-28 ENCOUNTER — Other Ambulatory Visit: Payer: Self-pay | Admitting: *Deleted

## 2014-04-28 ENCOUNTER — Ambulatory Visit: Payer: No Typology Code available for payment source | Admitting: Internal Medicine

## 2014-04-28 DIAGNOSIS — G8929 Other chronic pain: Secondary | ICD-10-CM

## 2014-04-28 DIAGNOSIS — M549 Dorsalgia, unspecified: Principal | ICD-10-CM

## 2014-04-29 ENCOUNTER — Ambulatory Visit (HOSPITAL_COMMUNITY): Payer: No Typology Code available for payment source | Attending: Internal Medicine

## 2014-04-29 MED ORDER — TRAMADOL HCL 50 MG PO TABS: 100.0000 mg | ORAL_TABLET | Freq: Three times a day (TID) | ORAL | Status: DC | PRN

## 2014-04-29 MED ORDER — HYDROCODONE-ACETAMINOPHEN 10-325 MG PO TABS: 1.0000 | ORAL_TABLET | Freq: Three times a day (TID) | ORAL | Status: DC | PRN

## 2014-04-29 NOTE — Telephone Encounter (Signed)
[name] uses Ultram and Vicodin for her chronic back pain and fibromyalgia per her current pain contract.  She has been compliant and recently followed up in [facility] with Dr. Hayes Ludwig.  I will write for 3 months, and she will need to follow up at that time.

## 2014-04-30 ENCOUNTER — Telehealth: Payer: Self-pay | Admitting: *Deleted

## 2014-04-30 NOTE — Telephone Encounter (Signed)
Script done.

## 2014-05-03 ENCOUNTER — Telehealth: Payer: Self-pay | Admitting: *Deleted

## 2014-05-03 NOTE — Addendum Note (Signed)
Addended by: Hulan Fray on: 05/03/2014 06:18 PM   Modules accepted: Orders

## 2014-05-03 NOTE — Telephone Encounter (Signed)
Pt had called to see if Tramadol and Hydrocodone rxs were ready ; returned pt's called to informed her she could pick up rxs. Stated no one had called.

## 2014-05-05 ENCOUNTER — Ambulatory Visit: Payer: No Typology Code available for payment source | Admitting: Internal Medicine

## 2014-05-05 ENCOUNTER — Telehealth: Payer: Self-pay | Admitting: *Deleted

## 2014-05-05 NOTE — Telephone Encounter (Signed)
Message copied by Laurey Arrow on Wed May 05, 2014  3:33 PM ------      Message from: CERESI, MELANIE L      Created: Wed May 05, 2014  2:52 PM      Regarding: phone message      Contact: [phone]       Pt is getting hand and thumb swelling wants to know if prednisone would help?      [facility] her pharmacy  ------

## 2014-05-06 ENCOUNTER — Encounter: Payer: Self-pay | Admitting: Internal Medicine

## 2014-05-06 ENCOUNTER — Ambulatory Visit (INDEPENDENT_AMBULATORY_CARE_PROVIDER_SITE_OTHER): Payer: Self-pay | Admitting: Internal Medicine

## 2014-05-06 ENCOUNTER — Telehealth: Payer: Self-pay | Admitting: Family Medicine

## 2014-05-06 VITALS — BP 112/75 | HR 71 | Temp 97.7°F | Ht 62.0 in | Wt 202.7 lb

## 2014-05-06 DIAGNOSIS — IMO0002 Reserved for concepts with insufficient information to code with codable children: Secondary | ICD-10-CM

## 2014-05-06 DIAGNOSIS — N946 Dysmenorrhea, unspecified: Secondary | ICD-10-CM

## 2014-05-06 DIAGNOSIS — E1165 Type 2 diabetes mellitus with hyperglycemia: Secondary | ICD-10-CM

## 2014-05-06 MED ORDER — INSULIN ASPART 100 UNIT/ML ~~LOC~~ SOLN: 5.0000 [IU] | Freq: Three times a day (TID) | SUBCUTANEOUS | Status: DC

## 2014-05-06 NOTE — Patient Instructions (Signed)
General Instructions:  I want you to take 15 units of Lantus at the same time each day.  I want you to take 5 units of Novolog with each meal.  Every 60 units of glucose above 140 I want you to add an extra unit to this dose.  So if your sugar is 200 before a meal then you give yourself 6 units instead of 5 units.  You do not need to take Januvia.    If you have another hypoglycemic episode I want you to call the clinic.  Please bring your medicines with you each time you come to clinic.  Medicines may include prescription medications, over-the-counter medications, herbal remedies, eye drops, vitamins, or other pills.   Progress Toward Treatment Goals:  Treatment Goal 03/29/2014  Hemoglobin A1C deteriorated    Self Care Goals & Plans:  Self Care Goal 05/06/2014  Manage my medications take my medicines as prescribed; bring my medications to every visit; refill my medications on time  Monitor my health keep track of my blood glucose; bring my glucose meter and log to each visit  Eat healthy foods drink diet soda or water instead of juice or soda; eat more vegetables; eat foods that are low in salt; eat baked foods instead of fried foods; eat fruit for snacks and desserts  Be physically active take a walk every day  Meeting treatment goals maintain the current self-care plan    Home Blood Glucose Monitoring 05/06/2014  Check my blood sugar 3 times a day  When to check my blood sugar before breakfast; before lunch; before dinner     Care Management & Community Referrals:  Referral 05/06/2014  Referrals made for care management support nutritionist

## 2014-05-06 NOTE — Telephone Encounter (Signed)
Message copied by Laurey Arrow on Wed May 05, 2014 3:33 PM  ------  Message from: CERESI, MELANIE L  Created: Wed May 05, 2014 2:52 PM  Regarding: phone message  Contact: [phone]  Pt is getting hand and thumb swelling wants to know if prednisone would help?  [facility] her pharmacy    RHEA No I owold not do any more prednisone at this time [facility]! Dorcas Mcmurray

## 2014-05-09 ENCOUNTER — Encounter: Payer: Self-pay | Admitting: Internal Medicine

## 2014-05-09 NOTE — Assessment & Plan Note (Signed)
Has appointment with Ob/gyn next week.

## 2014-05-09 NOTE — Assessment & Plan Note (Addendum)
Lab Results  Component Value Date   HGBA1C 10.5 03/29/2014   HGBA1C 8.7 12/29/2013   HGBA1C 9.3 09/15/2013     Assessment: Diabetes control: poor control (HgbA1C >9%) Progress toward A1C goal:    Comments: several hypoglycemic episodes  Plan: Medications: Lantus 15 units daily, Novolog 5 units TID with meals +correction factor of 60. Home glucose monitoring: Frequency: 3 times a day Timing: before breakfast;before lunch;before dinner Instruction/counseling given: reminded to get eye exam, reminded to bring blood glucose meter & log to each visit, reminded to bring medications to each visit and other instruction/counseling: extensively consuled on hypoglycemia and proper insulin use. Educational resources provided:   Self management tools provided:   Other plans: Advised that her self titration of Lantus was very dangerous.  I am changing her therapy by adding mealtime insulin which she is aggreeable with.  She will follow up with her PCP in 1-2 months.  I d/c Januvia (this will likely provide little value anyway).

## 2014-05-09 NOTE — Progress Notes (Signed)
[facility] Subjective:   Patient ID: [name] female   DOB: February 22, 1961 53 y.o.   MRN: [id number]  HPI: [name] is a 53 y.o. female with a PMH significant for HLP, DM2, fibromyalgia, chronic back pain, who presents for follow up visit for diabetes.  At her last visit it was noted that her diabetes was uncontrolled with Lantus 15units daily and an A1c of 10.5.  She was instructed to increase her lantus to 17 units and to start Januvia.  She however apparently has increased her Lantus on her own to 40 units a day since that is what she has been on before when she was taking steroids (although she is off steroids). And has not been able to obtain Januvia due to insurance issues.  She reports she has had at least 6 episodes of diaphoresis during the night that has awoken her from sleep and she found herself to be hypoglycemic.  She notes otherwise her sugars are a little better controlled but does not bring her meter.    Past Medical History  Diagnosis Date  . Adhesive capsulitis of right shoulder     with underlying tendinopathy rotator cuff  . History of post-sterilization tuboplasty 2000  . Tear of meniscus of left knee     x2  . Tear of meniscus of right knee   . Plantar fasciitis     Right  . Shortness of breath     with exertion  . Diabetes mellitus     oral tx  . GERD (gastroesophageal reflux disease)   . Arthritis   . Sleep apnea 5 plus yrs    study -pt could not sleep test inconclusive.    Current Outpatient Prescriptions  Medication Sig Dispense Refill  . HYDROcodone-acetaminophen (NORCO) 10-325 MG per tablet Take 1 tablet by mouth 3 (three) times daily as needed for moderate pain. Do not fill until 30 days after last prescription was filled.  90 tablet  0  . insulin glargine (LANTUS) 100 UNIT/ML injection Inject 15 units at bedtime  10 mL  11  . Liraglutide (VICTOZA [place]) Inject 1.8 mg into the skin at bedtime.       . traMADol  (ULTRAM) 50 MG tablet Take 2 tablets (100 mg total) by mouth every 8 (eight) hours as needed for moderate pain. Please do not refill until 30 days after last prescription was filled.  180 tablet  0  . cetirizine (ZYRTEC) 10 MG chewable tablet Chew 1 tablet (10 mg total) by mouth daily.  30 tablet  2  . famotidine (PEPCID) 20 MG tablet Take 1 tablet (20 mg total) by mouth 2 (two) times daily.  60 tablet  0  . hydrocortisone cream 0.5 % Apply 1 application topically 2 (two) times daily as needed for itching.      . hydrOXYzine (ATARAX/VISTARIL) 50 MG tablet Take 1 tablet (50 mg total) by mouth every 6 (six) hours as needed for itching.  90 tablet  0  . insulin aspart (NOVOLOG) 100 UNIT/ML injection Inject 5 Units into the skin 3 (three) times daily with meals.  10 mL  3   No current facility-administered medications for this visit.   Family History  Problem Relation Age of Onset  . Cancer Sister     both sisters have breast cancer   History   Social History  . Marital Status: Married    Spouse Name: N/A    Number of Children: N/A  . Years  of Education: GED +1 yr   Occupational History  . unemployed Unemployed   Social History Main Topics  . Smoking status: Never Smoker   . Smokeless tobacco: Never Used  . Alcohol Use: No  . Drug Use: No  . Sexual Activity: None   Other Topics Concern  . None   Social History Narrative   Married, unemployed, Husband disabled and paraplegic 2/2 fall from tree stand while deer hunting, Son quadriplegic 2/2 MVA 05/2007 in [place]: Review of Systems  Constitutional: Negative for fever and chills.  Eyes: Negative for blurred vision.  Respiratory: Negative for cough and shortness of breath.   Cardiovascular: Negative for chest pain.  Genitourinary: Negative for dysuria.  Neurological: Negative for headaches.  Endo/Heme/Allergies: Negative for polydipsia.     Objective:  Physical Exam: Filed Vitals:   05/06/14 1535  BP:  112/75  Pulse: 71  Temp: 97.7 F (36.5 C)  TempSrc: Oral  Height: 5\' 2"  (1.575 m)  Weight: 202 lb 11.2 oz (91.944 kg)  SpO2: 99%  Physical Exam  Nursing note and vitals reviewed. Constitutional: She is oriented to person, place, and time and well-developed, well-nourished, and in no distress.  HENT:  Head: Normocephalic and atraumatic.  Cardiovascular: Normal rate, regular rhythm and intact distal pulses.   Pulmonary/Chest: Effort normal and breath sounds normal.  Abdominal: Soft. Bowel sounds are normal.  Neurological: She is alert and oriented to person, place, and time.  Skin: Skin is warm and dry.  Psychiatric: Affect normal.    Assessment & Plan:  Case discussed with Dr. Beryle Beams See Problem Based Assessment and Plan Medications Ordered Meds ordered this encounter  Medications  . insulin aspart (NOVOLOG) 100 UNIT/ML injection    Sig: Inject 5 Units into the skin 3 (three) times daily with meals.    Dispense:  10 mL    Refill:  3   Other Orders No orders of the defined types were placed in this encounter.

## 2014-05-12 ENCOUNTER — Encounter: Payer: No Typology Code available for payment source | Admitting: Obstetrics and Gynecology

## 2014-05-13 ENCOUNTER — Ambulatory Visit: Payer: No Typology Code available for payment source | Admitting: Internal Medicine

## 2014-05-14 NOTE — Progress Notes (Signed)
Medicine attending: Medical history, presenting problems, physical findings, and medications, reviewed with resident physician Dr. Johnnette Gourd on the day of the patient's visit and I concur with his evaluation and management plan. Murriel Hopper, M.D., [place]

## 2014-06-14 NOTE — Addendum Note (Signed)
Addended by: Hulan Fray on: 06/14/2014 06:01 PM   Modules accepted: Orders

## 2014-06-16 ENCOUNTER — Ambulatory Visit: Payer: Self-pay

## 2014-06-18 ENCOUNTER — Telehealth: Payer: Self-pay | Admitting: *Deleted

## 2014-06-18 ENCOUNTER — Ambulatory Visit: Payer: Self-pay

## 2014-06-18 ENCOUNTER — Ambulatory Visit (INDEPENDENT_AMBULATORY_CARE_PROVIDER_SITE_OTHER): Payer: Self-pay | Admitting: Family Medicine

## 2014-06-18 DIAGNOSIS — M25562 Pain in left knee: Secondary | ICD-10-CM

## 2014-06-18 MED ORDER — METHYLPREDNISOLONE ACETATE 80 MG/ML IJ SUSP
80.0000 mg | Freq: Once | INTRAMUSCULAR | Status: AC
  Administered 2014-06-18: 80 mg via INTRA_ARTICULAR

## 2014-06-18 NOTE — Telephone Encounter (Signed)
Pt came by the clinic stating she is having a hard time sleeping at night.  She was given Ativan in the past ( once in June for # 15 )  She is asking for a refill so she can take every night. # 30 a month.

## 2014-06-18 NOTE — Telephone Encounter (Signed)
We will be unable to give her a sleeping aid without first assessing her in the clinic

## 2014-06-18 NOTE — Telephone Encounter (Signed)
Pt informed

## 2014-06-18 NOTE — Progress Notes (Signed)
   Subjective:    Patient ID: [name], female    DOB: Dec 13, 1960, 53 y.o.   MRN: [id number]  HPI Left knee pain over the last 2-3 weeks. Has had some chronic issues and had a partial meniscectomy several years ago. Not sure why it has flared up but is having aching pain with activity. No swelling, no erythema. No locking. Does feel stiff.   Review of Systems No fever, sweats, chills.    Objective:   Physical Exam Vital signs are reviewed GEN.: Well-developed female no acute distress KNEE: Left. Crepitus with extension but she has full range of motion in extension and flexion. Popliteal spaces soft. Calf is soft. Medial joint line tenderness to palpation. Lateral joint line is nontender to palpation. Ligamentously intact to varus and valgus stress.  INJECTION: Patient was given informed consent, signed copy in the chart. Appropriate time out was taken. Area prepped and draped in usual sterile fashion. 1 cc of methylprednisolone 40 mg/ml plus  4 cc of 1% lidocaine without epinephrine was injected into the left knee using a(n) anterior medial approach. The patient tolerated the procedure well. There were no complications. Post procedure instructions were given.        Assessment & Plan:

## 2014-06-18 NOTE — Telephone Encounter (Signed)
Patient will need an appointment to address the issue. Thank you Edd Fabian

## 2014-07-02 ENCOUNTER — Ambulatory Visit (INDEPENDENT_AMBULATORY_CARE_PROVIDER_SITE_OTHER): Payer: Self-pay | Admitting: Internal Medicine

## 2014-07-02 ENCOUNTER — Encounter: Payer: Self-pay | Admitting: Internal Medicine

## 2014-07-02 VITALS — BP 130/77 | HR 76 | Temp 98.0°F | Ht 62.0 in | Wt 210.0 lb

## 2014-07-02 DIAGNOSIS — IMO0002 Reserved for concepts with insufficient information to code with codable children: Secondary | ICD-10-CM

## 2014-07-02 DIAGNOSIS — G8929 Other chronic pain: Secondary | ICD-10-CM

## 2014-07-02 DIAGNOSIS — Z0000 Encounter for general adult medical examination without abnormal findings: Secondary | ICD-10-CM

## 2014-07-02 DIAGNOSIS — E1165 Type 2 diabetes mellitus with hyperglycemia: Secondary | ICD-10-CM

## 2014-07-02 DIAGNOSIS — M549 Dorsalgia, unspecified: Secondary | ICD-10-CM

## 2014-07-02 LAB — COMPLETE METABOLIC PANEL WITH GFR
ALT: 19 U/L (ref 0–35)
AST: 19 U/L (ref 0–37)
Albumin: 3.7 g/dL (ref 3.5–5.2)
Alkaline Phosphatase: 104 U/L (ref 39–117)
BUN: 13 mg/dL (ref 6–23)
CO2: 21 mEq/L (ref 19–32)
Calcium: 9.2 mg/dL (ref 8.4–10.5)
Chloride: 103 mEq/L (ref 96–112)
Creat: 0.52 mg/dL (ref 0.50–1.10)
GFR, Est African American: >89 mL/min
GFR, Est Non African American: >89 mL/min
Glucose, Bld: 146 mg/dL — ABNORMAL HIGH (ref 70–99)
Potassium: 4.3 mEq/L (ref 3.5–5.3)
Sodium: 136 mEq/L (ref 135–145)
Total Bilirubin: 0.4 mg/dL (ref 0.2–1.2)
Total Protein: 6.9 g/dL (ref 6.0–8.3)

## 2014-07-02 LAB — CBC WITH DIFFERENTIAL/PLATELET
Basophils Absolute: 0 10*3/uL (ref 0.0–0.1)
Basophils Relative: 0 % (ref 0–1)
Eosinophils Absolute: 0.2 10*3/uL (ref 0.0–0.7)
Eosinophils Relative: 2 % (ref 0–5)
HCT: 45.7 % (ref 36.0–46.0)
Hemoglobin: 15.3 g/dL — ABNORMAL HIGH (ref 12.0–15.0)
Lymphocytes Relative: 40 % (ref 12–46)
Lymphs Abs: 4.3 10*3/uL — ABNORMAL HIGH (ref 0.7–4.0)
MCH: 28.8 pg (ref 26.0–34.0)
MCHC: 33.5 g/dL (ref 30.0–36.0)
MCV: 85.9 fL (ref 78.0–100.0)
MPV: 10.4 fL (ref 9.4–12.4)
Monocytes Absolute: 0.4 10*3/uL (ref 0.1–1.0)
Monocytes Relative: 4 % (ref 3–12)
Neutro Abs: 5.8 10*3/uL (ref 1.7–7.7)
Neutrophils Relative %: 54 % (ref 43–77)
Platelets: 304 10*3/uL (ref 150–400)
RBC: 5.32 MIL/uL — ABNORMAL HIGH (ref 3.87–5.11)
RDW: 13.2 % (ref 11.5–15.5)
WBC: 10.8 10*3/uL — ABNORMAL HIGH (ref 4.0–10.5)

## 2014-07-02 LAB — LIPID PANEL
Cholesterol: 172 mg/dL (ref 0–200)
HDL: 60 mg/dL (ref >39)
LDL Cholesterol: 85 mg/dL (ref 0–99)
Total CHOL/HDL Ratio: 2.9 Ratio
Triglycerides: 134 mg/dL (ref <150)
VLDL: 27 mg/dL (ref 0–40)

## 2014-07-02 LAB — GLUCOSE, CAPILLARY: Glucose-Capillary: 219 mg/dL — ABNORMAL HIGH (ref 70–99)

## 2014-07-02 LAB — POCT GLYCOSYLATED HEMOGLOBIN (HGB A1C): Hemoglobin A1C: 9.8

## 2014-07-02 MED ORDER — HYDROCODONE-ACETAMINOPHEN 10-325 MG PO TABS: 1.0000 | ORAL_TABLET | Freq: Three times a day (TID) | ORAL | Status: DC | PRN

## 2014-07-02 MED ORDER — TRAMADOL HCL 50 MG PO TABS: 100.0000 mg | ORAL_TABLET | Freq: Three times a day (TID) | ORAL | Status: DC | PRN

## 2014-07-02 MED ORDER — MELOXICAM 15 MG PO TABS: 15.0000 mg | ORAL_TABLET | Freq: Every day | ORAL | Status: DC

## 2014-07-02 MED ORDER — HYDROCODONE-ACETAMINOPHEN 10-325 MG PO TABS
1.0000 | ORAL_TABLET | Freq: Three times a day (TID) | ORAL | Status: DC | PRN
Start: 2014-07-02 — End: 2014-07-02

## 2014-07-02 NOTE — Patient Instructions (Signed)
Thank you for coming to clinic today [name].  General instructions: -I changed your insulin regimen to 20 units of Lantus at bedtime. -Keep track of your blood sugars and let us know if your sugars are getting too low or high. -Please make a follow up appointment to return to clinic in 2 months for your pap smear and to follow up your blood sugars.  Please bring your medicines with you each time you come.   Medicines may be  Eye drops  Herbal   Vitamins  Pills  Seeing these help [name] take care of you.

## 2014-07-02 NOTE — Assessment & Plan Note (Addendum)
Lab Results  Component Value Date   HGBA1C 9.8 07/02/2014   HGBA1C 10.5 03/29/2014   HGBA1C 8.7 12/29/2013     Assessment: Diabetes control: poor control (HgbA1C >9%) Progress toward A1C goal:   Improved. Comments: Has not been taking insulin, was previously taking 40 units Lantus at bedtime while on steroids.  Glucose was not controlled even when taking current regimen correctly per her report, so will increase dose.  She seems motivated to increase compliance, and her husband is a good support system.  Plan: Medications:  Increase Lantus to 20 units QHS, continue Novolog 5 units with meals if BS>160. Home glucose monitoring: Frequency: 4 times a day Timing: before breakfast, before lunch, before dinner, at bedtime Instruction/counseling given: reminded to bring blood glucose meter & log to each visit, reminded to bring medications to each visit, discussed the need for weight loss and discussed diet Other plans: Will return to clinic in 2 months after getting glucometer back and taking insulin.  Will adjust regimen further at that time. -Check lipid panel, urine microalbumin.

## 2014-07-02 NOTE — Progress Notes (Signed)
   Subjective:    Patient ID: [name], female    DOB: 03/28/1961, 53 y.o.   MRN: [id number]  HPI [name] is a 53 year old woman with history of DM2, HLD, fibromyalgia, and chronic pain presenting for routine follow up.  She reports having surgery on her left foot at [facility] on 12/7 by Dr. Laverta Baltimore for plantar fibromatosis.  She also says she was diagnosed with carpal tunnel syndrome this morning by her hand surgeon at [facility] this morning, and she received some steroid injections.  She has been having breakthrough pain in her foot since the surgery on her current pain regimen.  It usually works for her chronic pain, but her pain has been worse since her surgery.  She tried some meloxicam for her knee pain that was prescribed for her in [place] that has been helping significantly with all of her pain.  She says that she hasn't been taking her insulin since her surgery because she has been less mobile and doesn't want to bother her husband to bring it to her.  She says her blood sugars had been running in the 150-160s when she was taking her insulin.   Review of Systems  Constitutional: Negative for fever, chills and diaphoresis.  HENT: Negative for congestion, rhinorrhea and sore throat.   Respiratory: Negative for apnea, cough and shortness of breath.   Cardiovascular: Negative for chest pain.  Gastrointestinal: Negative for nausea, vomiting, abdominal pain, diarrhea, constipation and abdominal distention.  Genitourinary: Negative for dysuria and hematuria.  Musculoskeletal: Positive for myalgias, back pain and arthralgias.  Skin: Negative for rash.  Neurological: Negative for dizziness, weakness and numbness.       Objective:   Physical Exam  Constitutional: She is oriented to person, place, and time. She appears well-developed and well-nourished. No distress.  HENT:  Head: Normocephalic and atraumatic.  Eyes: Conjunctivae and EOM are normal. Pupils are equal, round,  and reactive to light. No scleral icterus.  Cardiovascular: Normal rate, regular rhythm and normal heart sounds.   Pulmonary/Chest: Effort normal and breath sounds normal. No respiratory distress.  Abdominal: Soft. Bowel sounds are normal. She exhibits no distension. There is no tenderness.  Musculoskeletal: Normal range of motion. She exhibits tenderness (Lower back. ). She exhibits no edema.  Bandage on L foot. Dupuytren's contracture of both hands worse on R.  Neurological: She is alert and oriented to person, place, and time.  Skin:  Lipoma on right flank.          Assessment & Plan:  Please see problem-based assessment and plan.

## 2014-07-02 NOTE — Progress Notes (Signed)
Internal Medicine Clinic Attending  I saw and evaluated the patient.  I personally confirmed the key portions of the history and exam documented by Dr. Moding and I reviewed pertinent patient test results.  The assessment, diagnosis, and plan were formulated together and I agree with the documentation in the resident's note. 

## 2014-07-02 NOTE — Assessment & Plan Note (Signed)
Chronic pain stable and controlled on current regimen, but increased pain due to recent surgery.  Mobic has been working for her, and she would like to continue this prescription.  Her kidney function was normal at last check.  She is at some risk due to her diabetes, so we will have to monitor her kidney function closely.  Will be running out of pain meds on 07/30/14, so she would like to take her next prescriptions today to avoid coming back to clinic. -Wrote for three more months of Norco 10-325 mg TID PRN and Ultram 100 mg q8h PRN. -Mobic 15 mg daily. -Check BMP today.

## 2014-07-02 NOTE — Assessment & Plan Note (Signed)
Mammogram scheduled previously, but she cancelled her appointment.  Has FOBT cards because she cannot afford colonoscopy, but she has not collected them yet.  Does not want pap smear today. -Reordered mammogram. -FOBT cards for colon cancer screening. -Follow up in 2 months for pap smear.

## 2014-07-03 LAB — MICROALBUMIN / CREATININE URINE RATIO
Creatinine, Urine: 233.4 mg/dL
Microalb Creat Ratio: 7.7 mg/g (ref 0.0–30.0)
Microalb, Ur: 1.8 mg/dL (ref <2.0)

## 2014-07-23 ENCOUNTER — Ambulatory Visit: Payer: Self-pay | Admitting: Family Medicine

## 2014-07-23 ENCOUNTER — Telehealth: Payer: Self-pay | Admitting: Family Medicine

## 2014-07-23 NOTE — Telephone Encounter (Signed)
Error [name]

## 2014-08-02 ENCOUNTER — Ambulatory Visit: Payer: Self-pay | Admitting: Family Medicine

## 2014-08-03 ENCOUNTER — Other Ambulatory Visit: Payer: Self-pay | Admitting: *Deleted

## 2014-08-03 NOTE — Progress Notes (Signed)
DR Henreitta Leber DX: L KNEE PAIN THURS 08/19/14 @ [address] [place] [address] [phone] PT NEEDS TO BRING $55 TO THE APPT

## 2014-08-06 ENCOUNTER — Ambulatory Visit: Payer: Self-pay | Admitting: Family Medicine

## 2014-08-19 ENCOUNTER — Telehealth: Payer: Self-pay | Admitting: *Deleted

## 2014-08-19 NOTE — Telephone Encounter (Signed)
Call from Richardson Landry, Pharmacist at [facility],  wanting to report that patient picked up a Rx for Vicodin 5/325 mg instead of tramadol. ( pharmacy error ) Pt reported the error, did not take any of the incorrect meds and returned them to pharmacy. They wanted to make [name] aware. Pharmacy will investigate how this happened.

## 2014-08-19 NOTE — Telephone Encounter (Signed)
Pt states she needs something to help her sleep, she states she had lorazepam in the past, please advise

## 2014-08-19 NOTE — Telephone Encounter (Signed)
It looks like the lorazepam was prescribed by an ER physician. I do not feel comfortable filling that prescription currently. Since this appears to be a new problem, she'll need to be seen in the clinic before starting any medications.

## 2014-09-13 ENCOUNTER — Encounter: Payer: Self-pay | Admitting: Family Medicine

## 2014-09-17 ENCOUNTER — Encounter: Payer: Self-pay | Admitting: Internal Medicine

## 2014-09-20 ENCOUNTER — Encounter: Payer: Self-pay | Admitting: *Deleted

## 2014-09-30 ENCOUNTER — Telehealth: Payer: Self-pay | Admitting: Dietician

## 2014-09-30 NOTE — Telephone Encounter (Signed)
Call to patient to confirm appointment for 10/01/14 at 1:15 & 2:30 lmtcb

## 2014-10-01 ENCOUNTER — Encounter: Payer: Self-pay | Admitting: Dietician

## 2014-10-01 ENCOUNTER — Encounter: Payer: Self-pay | Admitting: Internal Medicine

## 2014-10-11 ENCOUNTER — Encounter: Payer: Self-pay | Admitting: Family Medicine

## 2014-10-11 ENCOUNTER — Ambulatory Visit (INDEPENDENT_AMBULATORY_CARE_PROVIDER_SITE_OTHER): Payer: Self-pay | Admitting: Family Medicine

## 2014-10-11 VITALS — BP 157/73 | Ht 62.0 in | Wt 216.0 lb

## 2014-10-11 DIAGNOSIS — M75101 Unspecified rotator cuff tear or rupture of right shoulder, not specified as traumatic: Secondary | ICD-10-CM

## 2014-10-11 MED ORDER — METHYLPREDNISOLONE ACETATE 40 MG/ML IJ SUSP
40.0000 mg | Freq: Once | INTRAMUSCULAR | Status: AC
  Administered 2014-10-11: 40 mg via INTRA_ARTICULAR

## 2014-10-12 NOTE — Progress Notes (Signed)
   Subjective:    Patient ID: [name], female    DOB: 08/20/1960, 54 y.o.   MRN: [id number]  HPI  Right shoulder pain. Was lifting her quadriplegic son when she noted some sharp shoulder pain. That was 3 or 4 days ago. It has not gotten better and she's not able to do the things she needs to do at home because she has trouble lifting her arm above shoulder height. She has had this problem before with some rotator cuff issues and has also previously had adhesive capsulitis. Pain is 4/10 in the little worse at night.  Review of Systems She's had no unusual erythema or swelling of the right shoulder. She's had no fever, sweats, chills.    Objective:   Physical Exam  Vital signs are reviewed GEN.: Well-developed female no acute distress Shoulder: Full passive range of motion. Active range of motion up to 90 in for flexion and abduction, secondary to pain. Full internal rotation. Pain with supraspinatus testing. NEURO: Distally she has intact sensation to soft touch bilaterally with slight VASCULAR: Radial pulses 2+ bilaterally equal. Normal cap refill.  INJECTION: Patient was given informed consent, signed copy in the chart. Appropriate time out was taken. Area prepped and draped in usual sterile fashion. 1 cc of methylprednisolone 40 mg/ml plus  4 cc of 1% lidocaine without epinephrine was injected into the right subacromial bursa using a(n) posterior approach. The patient tolerated the procedure well. There were no complications. Post procedure instructions were given.       Assessment & Plan:  Exacerbation of rotator cuff syndrome/bursitis. I don't see any sign of adhesive capsulitis today. Given her need to care for her quadriplegic son, I think we'll go ahead and try her with a corticosteroid injection today. I hesitate a little bit because reportedly her A1c was quite high last time but I think the benefit outweighs any increased hyperglycemia she would have and she agrees  with this plan as well.

## 2014-10-15 ENCOUNTER — Ambulatory Visit: Payer: Self-pay | Admitting: Internal Medicine

## 2014-10-19 ENCOUNTER — Other Ambulatory Visit: Payer: Self-pay | Admitting: *Deleted

## 2014-10-19 ENCOUNTER — Telehealth: Payer: Self-pay | Admitting: *Deleted

## 2014-10-19 DIAGNOSIS — G8929 Other chronic pain: Secondary | ICD-10-CM

## 2014-10-19 DIAGNOSIS — M549 Dorsalgia, unspecified: Principal | ICD-10-CM

## 2014-10-19 MED ORDER — MELOXICAM 15 MG PO TABS: 15.0000 mg | ORAL_TABLET | Freq: Every day | ORAL | Status: DC

## 2014-10-19 NOTE — Telephone Encounter (Signed)
Pt called, let me know what to tell her.

## 2014-10-19 NOTE — Telephone Encounter (Signed)
She can use some meloxicam if she wants. Muscle relaxers will not likely help THANKS! Dorcas Mcmurray

## 2014-10-19 NOTE — Telephone Encounter (Signed)
-----   Message from Carolyne Littles sent at 10/19/2014  2:11 PM EDT ----- Regarding: phone message Contact: [phone] Pt has Rt shoulder pain, injection wore off after a week. Wants to know if there is anything she can do. Either a muscle relaxer or meloxicam?

## 2014-10-22 ENCOUNTER — Ambulatory Visit (INDEPENDENT_AMBULATORY_CARE_PROVIDER_SITE_OTHER): Payer: Self-pay | Admitting: Internal Medicine

## 2014-10-22 ENCOUNTER — Encounter: Payer: Self-pay | Admitting: Internal Medicine

## 2014-10-22 VITALS — BP 145/75 | HR 71 | Temp 98.1°F | Ht 62.0 in | Wt 215.3 lb

## 2014-10-22 DIAGNOSIS — E1165 Type 2 diabetes mellitus with hyperglycemia: Secondary | ICD-10-CM

## 2014-10-22 DIAGNOSIS — G4700 Insomnia, unspecified: Secondary | ICD-10-CM

## 2014-10-22 DIAGNOSIS — IMO0002 Reserved for concepts with insufficient information to code with codable children: Secondary | ICD-10-CM

## 2014-10-22 DIAGNOSIS — M549 Dorsalgia, unspecified: Secondary | ICD-10-CM

## 2014-10-22 DIAGNOSIS — Z0289 Encounter for other administrative examinations: Secondary | ICD-10-CM

## 2014-10-22 DIAGNOSIS — G8929 Other chronic pain: Secondary | ICD-10-CM

## 2014-10-22 DIAGNOSIS — Z79899 Other long term (current) drug therapy: Secondary | ICD-10-CM

## 2014-10-22 LAB — HM DIABETES EYE EXAM

## 2014-10-22 LAB — POCT GLYCOSYLATED HEMOGLOBIN (HGB A1C): Hemoglobin A1C: 8.2

## 2014-10-22 LAB — GLUCOSE, CAPILLARY: Glucose-Capillary: 121 mg/dL — ABNORMAL HIGH (ref 70–99)

## 2014-10-22 MED ORDER — SITAGLIPTIN PHOSPHATE 100 MG PO TABS: 100.0000 mg | ORAL_TABLET | Freq: Every day | ORAL | Status: DC

## 2014-10-22 MED ORDER — TRAZODONE HCL 100 MG PO TABS: 100.0000 mg | ORAL_TABLET | Freq: Every day | ORAL | Status: DC

## 2014-10-22 MED ORDER — HYDROCODONE-ACETAMINOPHEN 10-325 MG PO TABS: 1.0000 | ORAL_TABLET | Freq: Three times a day (TID) | ORAL | Status: DC | PRN

## 2014-10-22 MED ORDER — TRAMADOL HCL 50 MG PO TABS: 100.0000 mg | ORAL_TABLET | Freq: Three times a day (TID) | ORAL | Status: DC | PRN

## 2014-10-22 MED ORDER — TRAMADOL HCL 50 MG PO TABS
100.0000 mg | ORAL_TABLET | Freq: Three times a day (TID) | ORAL | Status: DC | PRN
Start: 2014-10-22 — End: 2015-01-27

## 2014-10-22 MED ORDER — METFORMIN HCL ER 500 MG PO TB24: 500.0000 mg | ORAL_TABLET | Freq: Every day | ORAL | Status: AC

## 2014-10-22 NOTE — Progress Notes (Signed)
[facility] Subjective:   Patient ID: [name] female   DOB: December 21, 1960 54 y.o.   MRN: [id number]  HPI: [name] is a 54 y.o. female with a PMH detailed below who presents for routine follow up for her multiple medical problems.  Insomnia: Patinet reports both trouble falling alseep and staying asleep.  She reports she was previously treated with Ativan which worked well.  She has tried benadryl in the past but was ineffective.  T2DM: She reports she really want to get her A1c to 7.0 because a surgeron at [facility] will then be willing to do a partial knee replacement for her.  He told her she could consider monthly A1c checks but she is not sure that will be effecitve.  Her husband has had great success with [place] and she wants to try this medication however she has no insurance.  She also was apparently intolerant to even 500mg  of metformin.  She is currently taking 15u of Lantus QHS, and reports her sugars have been mostly controlled but in general are still >140 in the AM.  She has been checking mostly once a day.   Chronic pain: Her PCP was in office today but an appointment was not avaliable.  She does have a pain contract and by my review has been complaint.  She reports she takes Norco 3 pills a day as well as Tramadol every 8 hours for her chronic back pain as well as her fibromatosis and knee pain.  The pain medications allow her to be mobile and active and most importantly help her to take care of her paraplegic husband and quadriplegic son.    Past Medical History  Diagnosis Date  . Adhesive capsulitis of right shoulder     with underlying tendinopathy rotator cuff  . History of post-sterilization tuboplasty 2000  . Tear of meniscus of left knee     x2  . Tear of meniscus of right knee   . Plantar fasciitis     Right  . Shortness of breath     with exertion  . Diabetes mellitus     oral tx  . GERD (gastroesophageal reflux  disease)   . Arthritis   . Sleep apnea 5 plus yrs    study -pt could not sleep test inconclusive.    Current Outpatient Prescriptions  Medication Sig Dispense Refill  . HYDROcodone-acetaminophen (NORCO) 10-325 MG per tablet Take 1 tablet by mouth 3 (three) times daily as needed for moderate pain. Do not fill until 30 days after last prescription was filled. 90 tablet 0  . insulin aspart (NOVOLOG) 100 UNIT/ML injection Inject 5 Units into the skin 3 (three) times daily with meals. (Patient taking differently: Inject 5 Units into the skin 3 (three) times daily with meals. When blood sugar above 160.) 10 mL 3  . insulin glargine (LANTUS) 100 UNIT/ML injection Inject 15 units at bedtime (Patient taking differently: Inject 15 Units into the skin daily. Inject 15 units at bedtime) 10 mL 11  . Liraglutide 18 MG/3ML SOPN Inject 18 mg into the skin daily.    . meloxicam (MOBIC) 15 MG tablet Take 1 tablet (15 mg total) by mouth daily. 30 tablet 1  . metFORMIN (GLUCOPHAGE XR) 500 MG 24 hr tablet Take 1 tablet (500 mg total) by mouth daily with breakfast. (Patient not taking: Reported on 10/22/2014) 30 tablet 2  . sitaGLIPtin (JANUVIA) 100 MG tablet Take 1 tablet (100 mg total) by mouth daily. (Patient not  taking: Reported on 10/22/2014) 30 tablet 11  . traMADol (ULTRAM) 50 MG tablet Take 2 tablets (100 mg total) by mouth every 8 (eight) hours as needed for moderate pain. Please do not refill until 30 days after last prescription was filled. 180 tablet 0  . traZODone (DESYREL) 100 MG tablet Take 1 tablet (100 mg total) by mouth at bedtime. 30 tablet 5   No current facility-administered medications for this visit.   Family History  Problem Relation Age of Onset  . Cancer Sister     both sisters have breast cancer   History   Social History  . Marital Status: Married    Spouse Name: N/A  . Number of Children: N/A  . Years of Education: GED +1 yr   Occupational History  . unemployed Unemployed    Social History Main Topics  . Smoking status: Never Smoker   . Smokeless tobacco: Never Used  . Alcohol Use: No  . Drug Use: No  . Sexual Activity: Not on file   Other Topics Concern  . None   Social History Narrative   Married, unemployed, Husband disabled and paraplegic 2/2 fall from tree stand while deer hunting, Son quadriplegic 2/2 MVA 05/2007 in [place]: Review of Systems  Constitutional: Negative for fever.  Eyes: Negative for blurred vision.  Respiratory: Negative for cough and shortness of breath.   Cardiovascular: Negative for chest pain.  Gastrointestinal: Negative for abdominal pain.  Genitourinary: Negative for frequency.  Musculoskeletal: Positive for back pain and joint pain.  Neurological: Negative for headaches.  Endo/Heme/Allergies: Negative for polydipsia.  Psychiatric/Behavioral: Negative for depression.     Objective:  Physical Exam: Filed Vitals:   10/22/14 1528  BP: 145/75  Pulse: 71  Temp: 98.1 F (36.7 C)  TempSrc: Oral  Height: 5\' 2"  (1.575 m)  Weight: 215 lb 4.8 oz (97.659 kg)  SpO2: 99%   Physical Exam  Constitutional: She is well-developed, well-nourished, and in no distress. No distress.  HENT:  Head: Normocephalic and atraumatic.  Eyes: Conjunctivae are normal.  Cardiovascular: Normal rate, regular rhythm, normal heart sounds and intact distal pulses.   No murmur heard. Pulmonary/Chest: Effort normal and breath sounds normal. No respiratory distress. She has no wheezes. She has no rales.  Abdominal: Soft. Bowel sounds are normal. She exhibits no distension. There is no tenderness.  Musculoskeletal: She exhibits no edema.  Skin: Skin is warm and dry. She is not diaphoretic.  Psychiatric: Affect and judgment normal.  Nursing note and vitals reviewed.    Assessment & Plan:  Case discussed with Dr. Ellwood Dense  Insomnia - I discussed with [name] proper sleep hygeine, as far as medications I am hesitant to  jump straight to a BZD for her insomnida and would like to try alternatives first. - Trazadone 100mg  QHSPRN.   Chronic back pain - Dr Trudee Kuster was in clinic, I did discuss [name] pain medications with him and he reported a 3 month refill would be appropriate if I felt so.   -Will check a UDS as she did report taking both Norco and Tramadol within the past day. - WIll refill 3 months a medication per her pain agreement.   Type 2 diabetes mellitus, uncontrolled Lab Results  Component Value Date   HGBA1C 8.2 10/22/2014   HGBA1C 9.8 07/02/2014   HGBA1C 10.5 03/29/2014     Assessment: Diabetes control: fair control Progress toward A1C goal:  improved Comments: has made great improvement, her and  her husband are trying to eat better.  Plan: Medications:  Will have her continue Lantus 15 uQHS (she may increase slowly to 20u if needed), WIll start trial of Metformin Xr 500mg  as this may be more tolerable.  And will provider a Rx for Januvia 100mg  daily, She will try [employer]'s medication assistace to help her get this. Home glucose monitoring: Frequency: 4 times a day Timing: before breakfast, before lunch, before dinner, at bedtime Instruction/counseling given: reminded to get eye exam, reminded to bring blood glucose meter & log to each visit, reminded to bring medications to each visit and discussed diet Educational resources provided:   Self management tools provided:   Other plans: Sent for retinal camera today. We discussed that A1c is a 3 month estimate, and it probably would not be a great idea to check monthly but would be reasonable to check a little early if her CGS are well controlled.        Medications Ordered Meds ordered this encounter  Medications  . sitaGLIPtin (JANUVIA) 100 MG tablet    Sig: Take 1 tablet (100 mg total) by mouth daily.    Dispense:  30 tablet    Refill:  11  . Liraglutide 18 MG/3ML SOPN    Sig: Inject 18 mg into the skin daily.  . metFORMIN  (GLUCOPHAGE XR) 500 MG 24 hr tablet    Sig: Take 1 tablet (500 mg total) by mouth daily with breakfast.    Dispense:  30 tablet    Refill:  2  . traZODone (DESYREL) 100 MG tablet    Sig: Take 1 tablet (100 mg total) by mouth at bedtime.    Dispense:  30 tablet    Refill:  5  . DISCONTD: HYDROcodone-acetaminophen (NORCO) 10-325 MG per tablet    Sig: Take 1 tablet by mouth 3 (three) times daily as needed for moderate pain. Do not fill until 30 days after last prescription was filled.    Dispense:  90 tablet    Refill:  0    Rx 1/3 Fill on or after 10/28/14.  Marland Kitchen DISCONTD: HYDROcodone-acetaminophen (NORCO) 10-325 MG per tablet    Sig: Take 1 tablet by mouth 3 (three) times daily as needed for moderate pain. Do not fill until 30 days after last prescription was filled.    Dispense:  90 tablet    Refill:  0    Rx 2/3 Fill 30 days after 10/28/14.  Marland Kitchen HYDROcodone-acetaminophen (NORCO) 10-325 MG per tablet    Sig: Take 1 tablet by mouth 3 (three) times daily as needed for moderate pain. Do not fill until 30 days after last prescription was filled.    Dispense:  90 tablet    Refill:  0    Rx 3/3 Fill 60 days after 10/28/14.  Marland Kitchen DISCONTD: traMADol (ULTRAM) 50 MG tablet    Sig: Take 2 tablets (100 mg total) by mouth every 8 (eight) hours as needed for moderate pain. Please do not refill until 30 days after last prescription was filled.    Dispense:  180 tablet    Refill:  0    Rx 1/3 May fill on 10/28/14.  Marland Kitchen DISCONTD: traMADol (ULTRAM) 50 MG tablet    Sig: Take 2 tablets (100 mg total) by mouth every 8 (eight) hours as needed for moderate pain. Please do not refill until 30 days after last prescription was filled.    Dispense:  180 tablet    Refill:  0    Rx  2/3 May fill 30 days after 10/28/14.  Marland Kitchen traMADol (ULTRAM) 50 MG tablet    Sig: Take 2 tablets (100 mg total) by mouth every 8 (eight) hours as needed for moderate pain. Please do not refill until 30 days after last prescription was filled.     Dispense:  180 tablet    Refill:  0    Rx 3/3 May fill 60 days after 10/28/14.   Other Orders Orders Placed This Encounter  Procedures  . Glucose, capillary  . Prescription Abuse Monitoring, 15 Panel  . Opiates/Opioids (LC/Marisa-Marisa)  . Cannabanoids (THCA) (GC/LC/Marisa), urine  . Tramadol, urine  . POC Hbg A1C  . Retinal/fundus photography    Standing Status: Future     Number of Occurrences: 1     Standing Expiration Date: 10/22/2015

## 2014-10-22 NOTE — Patient Instructions (Signed)
General Instructions:  Try the Metformin 500mg  XL once a day  Also get the [place] if you can  If you cannot I want you to slowly increase Lantus to 20units a day.  Please bring your medicines with you each time you come to clinic.  Medicines may include prescription medications, over-the-counter medications, herbal remedies, eye drops, vitamins, or other pills.   Progress Toward Treatment Goals:  Treatment Goal 10/22/2014  Hemoglobin A1C improved    Self Care Goals & Plans:  Self Care Goal 10/22/2014  Manage my medications take my medicines as prescribed; bring my medications to every visit; refill my medications on time  Monitor my health keep track of my blood glucose; bring my glucose meter and log to each visit  Eat healthy foods eat more vegetables; eat foods that are low in salt; eat baked foods instead of fried foods  Be physically active find an activity I enjoy  Meeting treatment goals maintain the current self-care plan    Home Blood Glucose Monitoring 10/22/2014  Check my blood sugar 4 times a day  When to check my blood sugar before breakfast; before lunch; before dinner; at bedtime     Care Management & Community Referrals:  Referral 10/22/2014  Referrals made for care management support none needed

## 2014-10-22 NOTE — Progress Notes (Signed)
Internal Medicine Clinic Attending  Case discussed with Dr. Hoffman at the time of the visit.  We reviewed the resident's history and exam and pertinent patient test results.  I agree with the assessment, diagnosis, and plan of care documented in the resident's note.  

## 2014-10-23 LAB — PRESCRIPTION ABUSE MONITORING 15P, URINE
Amphetamine/Meth: NEGATIVE ng/mL
Barbiturate Screen, Urine: NEGATIVE ng/mL
Benzodiazepine Screen, Urine: NEGATIVE ng/mL
Buprenorphine, Urine: NEGATIVE ng/mL
Carisoprodol, Urine: NEGATIVE ng/mL
Cocaine Metabolites: NEGATIVE ng/mL
Creatinine, Urine: 211.54 mg/dL (ref >20.0)
Fentanyl, Ur: NEGATIVE ng/mL
Meperidine, Ur: NEGATIVE ng/mL
Methadone Screen, Urine: NEGATIVE ng/mL
Oxycodone Screen, Ur: NEGATIVE ng/mL
Propoxyphene: NEGATIVE ng/mL
Zolpidem, Urine: NEGATIVE ng/mL

## 2014-10-24 NOTE — Assessment & Plan Note (Signed)
Lab Results  Component Value Date   HGBA1C 8.2 10/22/2014   HGBA1C 9.8 07/02/2014   HGBA1C 10.5 03/29/2014     Assessment: Diabetes control: fair control Progress toward A1C goal:  improved Comments: has made great improvement, her and her husband are trying to eat better.  Plan: Medications:  Will have her continue Lantus 15 uQHS (she may increase slowly to 20u if needed), WIll start trial of Metformin Xr 500mg  as this may be more tolerable.  And will provider a Rx for Januvia 100mg  daily, She will try [employer]'s medication assistace to help her get this. Home glucose monitoring: Frequency: 4 times a day Timing: before breakfast, before lunch, before dinner, at bedtime Instruction/counseling given: reminded to get eye exam, reminded to bring blood glucose meter & log to each visit, reminded to bring medications to each visit and discussed diet Educational resources provided:   Self management tools provided:   Other plans: Sent for retinal camera today. We discussed that A1c is a 3 month estimate, and it probably would not be a great idea to check monthly but would be reasonable to check a little early if her CGS are well controlled.

## 2014-10-24 NOTE — Assessment & Plan Note (Signed)
-   I discussed with [name] proper sleep hygeine, as far as medications I am hesitant to jump straight to a BZD for her insomnida and would like to try alternatives first. - Trazadone 100mg  QHSPRN.

## 2014-10-24 NOTE — Assessment & Plan Note (Signed)
-   Dr Trudee Kuster was in clinic, I did discuss [name] pain medications with him and he reported a 3 month refill would be appropriate if I felt so.   -Will check a UDS as she did report taking both Norco and Tramadol within the past day. - WIll refill 3 months a medication per her pain agreement.

## 2014-10-26 ENCOUNTER — Encounter: Payer: Self-pay | Admitting: *Deleted

## 2014-10-29 LAB — TRAMADOL, URINE
N-DESMETHYL-CIS-TRAMADOL: >50000 ng/mL — ABNORMAL HIGH (ref <100)
Tramadol, Urine: >50000 ng/mL — ABNORMAL HIGH (ref <100)

## 2014-10-29 LAB — OPIATES/OPIOIDS (LC/MS-MS)
Codeine Urine: NEGATIVE ng/mL (ref <50)
Hydrocodone: 1406 ng/mL — ABNORMAL HIGH (ref <50)
Hydromorphone: 220 ng/mL — ABNORMAL HIGH (ref <50)
Morphine Urine: NEGATIVE ng/mL (ref <50)
Norhydrocodone, Ur: 1313 ng/mL — ABNORMAL HIGH (ref <50)
Noroxycodone, Ur: NEGATIVE ng/mL (ref <50)
Oxycodone, ur: NEGATIVE ng/mL (ref <50)
Oxymorphone: NEGATIVE ng/mL (ref <50)

## 2014-10-29 LAB — CANNABANOIDS (GC/LC/MS), URINE: THC-COOH (GC/LC/MS), ur confirm: 53 ng/mL — ABNORMAL HIGH (ref <5)

## 2014-11-12 ENCOUNTER — Encounter: Payer: Self-pay | Admitting: *Deleted

## 2014-11-19 ENCOUNTER — Encounter: Payer: Self-pay | Admitting: Internal Medicine

## 2014-11-22 ENCOUNTER — Ambulatory Visit (HOSPITAL_COMMUNITY)
Admission: RE | Admit: 2014-11-22 | Discharge: 2014-11-22 | Disposition: A | Discharge status: Home / Self Care | Payer: No Typology Code available for payment source | Source: Ambulatory Visit | Attending: Internal Medicine | Admitting: Internal Medicine

## 2014-11-22 ENCOUNTER — Ambulatory Visit (INDEPENDENT_AMBULATORY_CARE_PROVIDER_SITE_OTHER): Payer: No Typology Code available for payment source | Admitting: Internal Medicine

## 2014-11-22 ENCOUNTER — Encounter: Payer: Self-pay | Admitting: Internal Medicine

## 2014-11-22 VITALS — BP 158/83 | HR 69 | Temp 98.1°F | Ht 62.0 in | Wt 211.3 lb

## 2014-11-22 DIAGNOSIS — B9789 Other viral agents as the cause of diseases classified elsewhere: Principal | ICD-10-CM

## 2014-11-22 DIAGNOSIS — J069 Acute upper respiratory infection, unspecified: Secondary | ICD-10-CM

## 2014-11-22 DIAGNOSIS — IMO0001 Reserved for inherently not codable concepts without codable children: Secondary | ICD-10-CM | POA: Insufficient documentation

## 2014-11-22 DIAGNOSIS — R058 Other specified cough: Secondary | ICD-10-CM

## 2014-11-22 DIAGNOSIS — R030 Elevated blood-pressure reading, without diagnosis of hypertension: Secondary | ICD-10-CM

## 2014-11-22 DIAGNOSIS — R0989 Other specified symptoms and signs involving the circulatory and respiratory systems: Secondary | ICD-10-CM | POA: Insufficient documentation

## 2014-11-22 DIAGNOSIS — IMO0002 Reserved for concepts with insufficient information to code with codable children: Secondary | ICD-10-CM

## 2014-11-22 DIAGNOSIS — E1165 Type 2 diabetes mellitus with hyperglycemia: Secondary | ICD-10-CM

## 2014-11-22 DIAGNOSIS — R05 Cough: Secondary | ICD-10-CM | POA: Insufficient documentation

## 2014-11-22 LAB — POCT URINE PREGNANCY: Preg Test, Ur: NEGATIVE

## 2014-11-22 LAB — GLUCOSE, CAPILLARY: Glucose-Capillary: 165 mg/dL — ABNORMAL HIGH (ref 70–99)

## 2014-11-22 MED ORDER — GUAIFENESIN-DM 100-10 MG/5ML PO SYRP: 5.0000 mL | ORAL_SOLUTION | ORAL | Status: DC | PRN

## 2014-11-22 NOTE — Assessment & Plan Note (Signed)
Elevated past three visits, although patient says that is not usual for her. Today likely elevated in setting of acute illness and stress. Last visit she says she had just taken an energy drink and new it would be high.   -recommend repeat on next visit, if still elevated, would consider starting low dose anti-hypertensive medication

## 2014-11-22 NOTE — Patient Instructions (Addendum)
General Instructions: Dear Ms. Stapleton,  Thank you for coming in today. I am sorry you are not feeling well.   Please continue to take your cough syrup for relief and get your chest xray done. I will call you with the results.   Keep up your water and food intake as much as possible.   If no improvement by the end of the week or if worsening call us right away [phone] or come to the office. If severe, or if having chest pain, sob, persistent fevers go directly to ED.   Please be sure to check your blood sugars and keep up with your insulin. If you have any lows or persistent highs let us know right away. If blood sugars <70, hold insulin and call us.   Please bring your medicines with you each time you come to clinic.  Medicines may include prescription medications, over-the-counter medications, herbal remedies, eye drops, vitamins, or other pills.   Progress Toward Treatment Goals:  Treatment Goal 10/22/2014  Hemoglobin A1C improved    Self Care Goals & Plans:  Self Care Goal 10/22/2014  Manage my medications take my medicines as prescribed; bring my medications to every visit; refill my medications on time  Monitor my health keep track of my blood glucose; bring my glucose meter and log to each visit  Eat healthy foods eat more vegetables; eat foods that are low in salt; eat baked foods instead of fried foods  Be physically active find an activity I enjoy  Meeting treatment goals maintain the current self-care plan    Home Blood Glucose Monitoring 10/22/2014  Check my blood sugar 4 times a day  When to check my blood sugar before breakfast; before lunch; before dinner; at bedtime     Care Management & Community Referrals:  Referral 10/22/2014  Referrals made for care management support none needed

## 2014-11-22 NOTE — Assessment & Plan Note (Signed)
Worse since Friday, minimal relief with otc remedies except for robitussin dm for cough. +sick contact grandson. Diarrhea now resolved. Able to keep down food and liquids now. Afebrile in opc but patient reports chills and feeling hot. She has also been in and out of hospitals caring for her son and stressed out given his recent death.   -likely viral with supportive measures for now -follow up 1 week, if no improvement will need further evaluation, ?bacterial etiology -given symptoms and decreased breath sounds on exam, will proceed with cxr to r/o PNA -strep screen -continue to encourage adequate water and po intake

## 2014-11-22 NOTE — Progress Notes (Signed)
Subjective:   Patient ID: [name] female   DOB: Feb 09, 1961 54 y.o.   MRN: [id number]  HPI: [name] is a 54 y.o. female with DM2 and other PMH as listed below presenting to opc today for acute visit with complaints of a cold since Wednesday that got worse on Friday. Her symptoms started with sneezing and runny nose, then on Friday developed into a productive cough with clear-yellow sputum, trouble sleeping at night with cough and congestion, sore throat, decreased po intake, nausea, and generalized weakness.  +sick contact is her grandson and now she says she got her husband sick as well. She also had diarrhea yesterday that has since then resolved. She has had her flu vaccine this year. Minimal relief with otc remedies including dayquil, nyquil, halls, tylenol. Her cough syrup with robitussin DM has been helping. She wonders if she has strep throat as in the past she presented similarly with her sore throat. Of note, she was also briefly dizzy yesterday that has since resolved. She was able to keep down sausage for breakfast this morning and has been drinking water and gatorade.   We also discussed the recent passing of her son. She is still sad but says she knows he is in heaven. She is happy her family was able to help out with the costs of his funeral. I again offered our condolences and assistance to the family during this difficult time.    Past Medical History  Diagnosis Date  . Adhesive capsulitis of right shoulder     with underlying tendinopathy rotator cuff  . History of post-sterilization tuboplasty 2000  . Tear of meniscus of left knee     x2  . Tear of meniscus of right knee   . Plantar fasciitis     Right  . Shortness of breath     with exertion  . Diabetes mellitus     oral tx  . GERD (gastroesophageal reflux disease)   . Arthritis   . Sleep apnea 5 plus yrs    study -pt could not sleep test inconclusive.    Current Outpatient Prescriptions    Medication Sig Dispense Refill  . HYDROcodone-acetaminophen (NORCO) 10-325 MG per tablet Take 1 tablet by mouth 3 (three) times daily as needed for moderate pain. Do not fill until 30 days after last prescription was filled. 90 tablet 0  . traMADol (ULTRAM) 50 MG tablet Take 2 tablets (100 mg total) by mouth every 8 (eight) hours as needed for moderate pain. Please do not refill until 30 days after last prescription was filled. 180 tablet 0  . guaiFENesin-dextromethorphan (ROBITUSSIN DM) 100-10 MG/5ML syrup Take 5 mLs by mouth every 4 (four) hours as needed for cough. 118 mL 0  . insulin aspart (NOVOLOG) 100 UNIT/ML injection Inject 5 Units into the skin 3 (three) times daily with meals. (Patient not taking: Reported on 11/22/2014) 10 mL 3  . insulin glargine (LANTUS) 100 UNIT/ML injection Inject 15 units at bedtime (Patient not taking: Reported on 11/22/2014) 10 mL 11  . Liraglutide 18 MG/3ML SOPN Inject 18 mg into the skin daily.    . meloxicam (MOBIC) 15 MG tablet Take 1 tablet (15 mg total) by mouth daily. (Patient not taking: Reported on 11/22/2014) 30 tablet 1  . metFORMIN (GLUCOPHAGE XR) 500 MG 24 hr tablet Take 1 tablet (500 mg total) by mouth daily with breakfast. (Patient not taking: Reported on 10/22/2014) 30 tablet 2  . sitaGLIPtin (JANUVIA) 100 MG tablet Take 1  tablet (100 mg total) by mouth daily. (Patient not taking: Reported on 10/22/2014) 30 tablet 11  . traZODone (DESYREL) 100 MG tablet Take 1 tablet (100 mg total) by mouth at bedtime. (Patient not taking: Reported on 11/22/2014) 30 tablet 5   No current facility-administered medications for this visit.   Family History  Problem Relation Age of Onset  . Cancer Sister     both sisters have breast cancer   History   Social History  . Marital Status: Married    Spouse Name: N/A  . Number of Children: N/A  . Years of Education: GED +1 yr   Occupational History  . unemployed Unemployed   Social History Main Topics  . Smoking status:  Never Smoker   . Smokeless tobacco: Never Used  . Alcohol Use: No  . Drug Use: No  . Sexual Activity: Not on file   Other Topics Concern  . None   Social History Narrative   Married, unemployed, Husband disabled and paraplegic 2/2 fall from tree stand while deer hunting, Son quadriplegic 2/2 MVA 05/2007 in [place]   Review of Systems:  Constitutional:  Chills, fatigue  HEENT:  Congestion, sore throat, sneezing  Respiratory:  SOB, cough  Cardiovascular:  Denies chest pain  Gastrointestinal:  Nausea. Diarrhea has resolved.   Genitourinary:  Denies dysuria  Musculoskeletal:  Generalized weakness   Neurological:  Dizzy yesterday now resolved   Objective:  Physical Exam: Filed Vitals:   11/22/14 1510  BP: 158/83  Pulse: 69  Temp: 98.1 F (36.7 C)  TempSrc: Oral  Height: 5\' 2"  (1.575 m)  Weight: 211 lb 4.8 oz (95.845 kg)  SpO2: 99%   Vitals reviewed. General: sitting on examination table wearing mask, intermittent coughing HEENT: EOMI, -tenderness to palpation of neck, no obvious lymphadenopathy palpated. Unable to clearly visualize posterior pharynx due to patient cooperation with exam.  Cardiac: RRR, Pulm: decreased breath sounds bases, left>right Abd: soft, BS present Ext: moving all extremities Neuro: alert and oriented X3  Assessment & Plan:  Discussed with Dr. Dareen Piano CXR Strep screen Robitussin DM F/u 1 week

## 2014-11-22 NOTE — Assessment & Plan Note (Addendum)
Lab Results  Component Value Date   HGBA1C 8.2 10/22/2014   HGBA1C 9.8 07/02/2014   HGBA1C 10.5 03/29/2014    Assessment: Diabetes control: fair control Progress toward A1C goal:    Comments: did not bring meter today. Reports not taking her medications past few days given recent illness and stress.   Plan: Medications:  continue current medications lantus 15 units, novolog 5 units tid, metformin, and januvia. Liraglutide listed on med list but patient denies taking.  Home glucose monitoring: Frequency:   Timing: before meals Instruction/counseling given: reminded to bring blood glucose meter & log to each visit, reminded to bring medications to each visit and discussed diet Other plans: f/u a1c July. Recommended medication adherence and following up her blood sugars in setting of illness as well.

## 2014-11-23 ENCOUNTER — Telehealth: Payer: Self-pay | Admitting: *Deleted

## 2014-11-23 LAB — RAPID STREP SCREEN (MED CTR MEBANE ONLY): Streptococcus, Group A Screen (Direct): NEGATIVE

## 2014-11-23 NOTE — Telephone Encounter (Signed)
Thank you. I returned the call. If she is not getting better by Friday she may return for follow up or next week, but likely this is viral and hopefully she will fell better soon.

## 2014-11-23 NOTE — Telephone Encounter (Signed)
Pt called stating she was seen in office yesterday.  Today she is coughing up yellow mucous. She is wondering if this may be bacterial.   Chest xray results are Negative.  Pt # [id number]

## 2014-11-23 NOTE — Progress Notes (Signed)
INTERNAL MEDICINE TEACHING ATTENDING ADDENDUM - Jaren Kearn, MD: I reviewed and discussed at the time of visit with the resident Dr. Qureshi, the patient's medical history, physical examination, diagnosis and results of pertinent tests and treatment and I agree with the patient's care as documented.  

## 2014-11-24 LAB — CULTURE, GROUP A STREP: Organism ID, Bacteria: NORMAL

## 2014-12-17 ENCOUNTER — Encounter: Payer: Self-pay | Admitting: Internal Medicine

## 2014-12-17 ENCOUNTER — Ambulatory Visit (INDEPENDENT_AMBULATORY_CARE_PROVIDER_SITE_OTHER): Payer: Self-pay | Admitting: Internal Medicine

## 2014-12-17 VITALS — BP 156/79 | HR 79 | Temp 97.8°F | Ht 60.0 in | Wt 208.5 lb

## 2014-12-17 DIAGNOSIS — B9789 Other viral agents as the cause of diseases classified elsewhere: Secondary | ICD-10-CM

## 2014-12-17 DIAGNOSIS — E1159 Type 2 diabetes mellitus with other circulatory complications: Secondary | ICD-10-CM | POA: Insufficient documentation

## 2014-12-17 DIAGNOSIS — E1165 Type 2 diabetes mellitus with hyperglycemia: Secondary | ICD-10-CM

## 2014-12-17 DIAGNOSIS — M797 Fibromyalgia: Secondary | ICD-10-CM

## 2014-12-17 DIAGNOSIS — J069 Acute upper respiratory infection, unspecified: Secondary | ICD-10-CM

## 2014-12-17 DIAGNOSIS — Z794 Long term (current) use of insulin: Secondary | ICD-10-CM

## 2014-12-17 DIAGNOSIS — M722 Plantar fascial fibromatosis: Secondary | ICD-10-CM

## 2014-12-17 DIAGNOSIS — I152 Hypertension secondary to endocrine disorders: Secondary | ICD-10-CM | POA: Insufficient documentation

## 2014-12-17 DIAGNOSIS — M549 Dorsalgia, unspecified: Secondary | ICD-10-CM

## 2014-12-17 DIAGNOSIS — Z79899 Other long term (current) drug therapy: Secondary | ICD-10-CM

## 2014-12-17 DIAGNOSIS — G8929 Other chronic pain: Secondary | ICD-10-CM

## 2014-12-17 DIAGNOSIS — IMO0002 Reserved for concepts with insufficient information to code with codable children: Secondary | ICD-10-CM

## 2014-12-17 DIAGNOSIS — I10 Essential (primary) hypertension: Secondary | ICD-10-CM

## 2014-12-17 MED ORDER — LIRAGLUTIDE 18 MG/3ML ~~LOC~~ SOPN: 1.8000 mg | PEN_INJECTOR | Freq: Every day | SUBCUTANEOUS | Status: DC

## 2014-12-17 MED ORDER — AMITRIPTYLINE HCL 25 MG PO TABS: 25.0000 mg | ORAL_TABLET | Freq: Every day | ORAL | Status: DC

## 2014-12-17 MED ORDER — LISINOPRIL 10 MG PO TABS: 10.0000 mg | ORAL_TABLET | Freq: Every day | ORAL | Status: DC

## 2014-12-17 NOTE — Assessment & Plan Note (Addendum)
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 She is scheduled to see orthopedics at [facility] to discuss removal. Some of this pain appears to be neuropathic as well, and she does have a history of fibromyalgia. As result, she may benefit from starting amitriptyline to treat the multiple causes of her pain (fibromyalgia, diabetic neuropathy, neuropathic pain). She was recently written a prescription for trazodone to take at bedtime, but she has not picked this up. Amitriptyline may also help with her sleep. -Start amitriptyline 25 mg daily at bedtime. -Given the interaction between tramadol and amitriptyline, she'll need to be monitored closely for side effects. -The patient was given warnings about the potential for seizures and serotonin syndrome while combining these medications. -Stop trazodone. Called pharmacy to cancel this prescription. -Continue meloxicam 15 mg daily.

## 2014-12-17 NOTE — Progress Notes (Signed)
   Subjective:    Patient ID: [name], female    DOB: 03/19/1961, 54 y.o.   MRN: [id number]  HPI [name] Is a 54 year old woman with history of type 2 diabetes, hyperlipidemia, fibromyalgia, and chronic pain presenting for follow-up of her chronic pain.  She was seen in [facility] clinic on 11/22/2014 with upper respiratory tract symptoms. Chest x-ray that time was unremarkable, and she was treated symptomatically.  She reports resolution of her symptoms.  In April, she was started on Januvia 100 mg daily, and she was to attempt  [employer]'s medical assistance program to help continue receiving this medication. Her pain medications were refilled for 3 months at that visit. UDS was positive for tramadol and opiates, but was also positive for cannabinoids.  She reports having a lot of pain in her left knee and bottom of her feet.  She reports having sharp stabbing pain in her feet that is intermittent.  She thinks it is related to the fibrosis in her feet.  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  She reports that she has an appointment to see her orthopedic surgeons at [facility] in July to discuss partial knee replacement and surgery on her right foot.  She didn't bring her glucometer, but her blood sugars have been running in the low 100s typically.  She has been self-dosing her Lantus taking doses between 15 and 40 units at bedtime. She rarely take Novolog because her appetite has been reduced.  Review of Systems  Constitutional: Negative for fever, chills and appetite change.  HENT: Negative for congestion, rhinorrhea and sore throat.   Eyes: Negative for visual disturbance.  Respiratory: Negative for cough, chest tightness and shortness of breath.   Cardiovascular: Negative for chest pain and leg swelling.  Gastrointestinal: Negative for nausea, vomiting, diarrhea, constipation and abdominal distention.  Genitourinary: Negative for  dysuria and difficulty urinating.  Musculoskeletal: Positive for myalgias and arthralgias.  Skin: Negative for rash.  Neurological: Negative for dizziness, weakness and numbness.       Objective:   Physical Exam  Constitutional: She is oriented to person, place, and time. She appears well-developed and well-nourished. No distress.  HENT:  Head: Normocephalic and atraumatic.  Eyes: Conjunctivae and EOM are normal. Pupils are equal, round, and reactive to light. No scleral icterus.  Neck: Normal range of motion. Neck supple.  Cardiovascular: Normal rate, regular rhythm and normal heart sounds.   Pulmonary/Chest: Effort normal and breath sounds normal.  Abdominal: Soft. Bowel sounds are normal. She exhibits no distension. There is no tenderness.  Musculoskeletal: Normal range of motion. She exhibits tenderness (Palpation of feet bilaterally and lower back.). She exhibits no edema.  Contractures of subcutaneous tissue on both hands. Left foot with surgical scar, subcutaneous fibrosis on plantar aspect of right foot along plantar fascia.  Neurological: She is alert and oriented to person, place, and time. No cranial nerve deficit. She exhibits normal muscle tone.  Skin: Skin is warm and dry. No rash noted. No erythema.       Assessment & Plan:  Please see problem-based assessment and plan.

## 2014-12-17 NOTE — Assessment & Plan Note (Signed)
Resolved. -No further work-up.

## 2014-12-17 NOTE — Assessment & Plan Note (Signed)
The patient has chronic back pain, and she is receiving tramadol and Vicodin per the [facility] pain contract. Her recent drug screen with cannabinoids would void her pain contract, but she absolutely denies ever using marijuana. She already has pain medications for the next 2 months, and I will leave the decision about continuing her pain medications up to her next PCP. It does seem to be helping significantly with her chronic back and other pain, and this is the first breech of her pain contract. However, she is currently asking for increased doses of her pain medication. -Continue tramadol 50 mg every 8 hours when necessary. -Continue Vicodin 10-325 milligrams 3 times a day when necessary. -No new prescriptions written, she has a prescriptions for one month to be filled on 12/28/2014.

## 2014-12-17 NOTE — Progress Notes (Signed)
Internal Medicine Clinic Attending  Case discussed with Dr. Moding at the time of the visit.  We reviewed the resident's history and exam and pertinent patient test results.  I agree with the assessment, diagnosis, and plan of care documented in the resident's note. 

## 2014-12-17 NOTE — Assessment & Plan Note (Signed)
BP Readings from Last 3 Encounters:  12/17/14 156/79  11/22/14 158/83  10/22/14 145/75   The patient's blood pressure has been persistently elevated over last few visits, and she would benefit from starting any antihypertensives. -Start lisinopril 10 mg daily given her diabetes. -Follow-up in one month for blood pressure recheck.

## 2014-12-17 NOTE — Assessment & Plan Note (Addendum)
Lab Results  Component Value Date   HGBA1C 8.2 10/22/2014   HGBA1C 9.8 07/02/2014   HGBA1C 10.5 03/29/2014     Assessment: Diabetes control: fair control Progress toward A1C goal:  improved Comments: The patient has been self modifying her diabetes regimen, which is dangerous. She often does not take her Lantus at bedtime because her sugar is in the low 100s, which may be contributing to her elevated A1c. In addition, she has been poorly compliant with her medications since her son's death 1 month ago.  Plan: Medications:  continue current medications: Lantus 15 units daily at bedtime, NovoLog 5 units 3 times a day with meals, metformin XR 500 mg with breakfast, Januvia 100 mg daily, Victoza 1.8 mg daily. Home glucose monitoring: Frequency: 4 times a day Timing: before meals, at bedtime Instruction/counseling given: reminded to get eye exam, reminded to bring blood glucose meter & log to each visit, reminded to bring medications to each visit, discussed the need for weight loss and discussed diet Other plans: Reinforced importance of taking her insulin as prescribed and checking her blood sugars 4 times per day along with bringing her glucometer to her visits. We will stick with her current regimen for now and adjust her blood sugar control at her next visit. She reports that she is in the process of completing the paperwork to get medical assistance for her Januvia.

## 2014-12-17 NOTE — Patient Instructions (Addendum)
Thank you for coming to clinic today [name].  General instructions: -I would like you to try a medication called amitriptyline at bedtime. This should help with your pain and sleep. -Please do not pick up your prescription for trazodone as this can interact with this medication. -Tramadol can sometimes interact with amitriptyline. Let us know if you experience any flushing, palpitations, or diarrhea while taking these medications in combination. There is also a low risk of seizures when taking amitriptyline and tramadol together. -It is important that you take your diabetes medications as prescribed and bring your glucometer each visit so that we can titrate your regimen safely. -I would like you to start taking lisinopril 10 mg daily because your blood pressure has been elevated your last few visits. -Please make a follow up appointment to return to clinic in 1 month.  Please try to bring all your medicines next time. This helps [name] take good care of you and stops mistakes from medicines that could hurt you.

## 2014-12-22 ENCOUNTER — Telehealth: Payer: Self-pay | Admitting: *Deleted

## 2014-12-22 DIAGNOSIS — M797 Fibromyalgia: Secondary | ICD-10-CM

## 2014-12-22 MED ORDER — AMITRIPTYLINE HCL 25 MG PO TABS: 50.0000 mg | ORAL_TABLET | Freq: Every day | ORAL | Status: DC

## 2014-12-22 NOTE — Telephone Encounter (Signed)
Pt calls and states she is not sleeping since starting the amitriptylline. She would like to try something else, she states her legs continue to twitch and jump, she sleeps on average 2 hrs nightly

## 2014-12-22 NOTE — Telephone Encounter (Signed)
Called patient and discussed her difficulty sleeping, which has been a chronic issue for her. She reports that she is getting a small amount relief with decreased anxiety after taking amitriptyline 25 mg at bedtime, but she continues to have pain in her legs. She has only been using this medication for 5 days, and I think that we should give this medication a longer try. Given her current response, I asked her to increase her dose to 50 mg at bedtime to see if this improves her symptoms. I asked her to continue to try this medication for another couple weeks to give it a chance to work.  Of note, she reports some symptoms of restless legs with difficulty getting comfortable in the middle of the night.  Last Hgb was slightly high 5 months ago, but could consider iron studies and starting a dopamine agonist (pramipexole or ropinirole) if she continues to have minimal relief with amitriptyline.

## 2014-12-29 ENCOUNTER — Telehealth: Payer: Self-pay | Admitting: *Deleted

## 2014-12-29 NOTE — Telephone Encounter (Signed)
Pt called and states she has been waking up at night with rt flank pain, lasting until next day.  She drinks a lot of water without relief.   She stopped taking Amitriptyline 2 nights ago. No pain today.   Was taking  amitriptyline 25 mg daily at bedtime.  Can she try something else for sleep

## 2014-12-29 NOTE — Telephone Encounter (Signed)
Please have the patient schedule an appointment to be seen in clinic for evaluation.  She did not describe the right flank pain to me in clinic (possible UTI or kidney stone?).  In addition, she is describing some symptoms that may be related to restless leg syndrome, and she should be evaluated for iron deficiency prior to starting treatment.

## 2014-12-29 NOTE — Telephone Encounter (Signed)
Pt called X 2 but no answer.  Message left to call clinic.

## 2014-12-30 ENCOUNTER — Encounter (HOSPITAL_COMMUNITY): Payer: Self-pay | Admitting: *Deleted

## 2014-12-30 ENCOUNTER — Telehealth: Payer: Self-pay | Admitting: *Deleted

## 2014-12-30 ENCOUNTER — Emergency Department (HOSPITAL_COMMUNITY)
Admission: EM | Admit: 2014-12-30 | Discharge: 2014-12-30 | Disposition: A | Discharge status: Home / Self Care | Payer: Self-pay | Attending: Emergency Medicine | Admitting: Emergency Medicine

## 2014-12-30 DIAGNOSIS — Z79899 Other long term (current) drug therapy: Secondary | ICD-10-CM | POA: Insufficient documentation

## 2014-12-30 DIAGNOSIS — M1990 Unspecified osteoarthritis, unspecified site: Secondary | ICD-10-CM | POA: Insufficient documentation

## 2014-12-30 DIAGNOSIS — Z8719 Personal history of other diseases of the digestive system: Secondary | ICD-10-CM | POA: Insufficient documentation

## 2014-12-30 DIAGNOSIS — Z794 Long term (current) use of insulin: Secondary | ICD-10-CM | POA: Insufficient documentation

## 2014-12-30 DIAGNOSIS — Z87828 Personal history of other (healed) physical injury and trauma: Secondary | ICD-10-CM | POA: Insufficient documentation

## 2014-12-30 DIAGNOSIS — Z791 Long term (current) use of non-steroidal anti-inflammatories (NSAID): Secondary | ICD-10-CM | POA: Insufficient documentation

## 2014-12-30 DIAGNOSIS — E119 Type 2 diabetes mellitus without complications: Secondary | ICD-10-CM | POA: Insufficient documentation

## 2014-12-30 DIAGNOSIS — L259 Unspecified contact dermatitis, unspecified cause: Secondary | ICD-10-CM | POA: Insufficient documentation

## 2014-12-30 DIAGNOSIS — Z8669 Personal history of other diseases of the nervous system and sense organs: Secondary | ICD-10-CM | POA: Insufficient documentation

## 2014-12-30 MED ORDER — METHYLPREDNISOLONE SODIUM SUCC 125 MG IJ SOLR
125.0000 mg | Freq: Once | INTRAMUSCULAR | Status: AC
  Administered 2014-12-30: 125 mg via INTRAMUSCULAR
  Filled 2014-12-30: qty 2

## 2014-12-30 NOTE — Telephone Encounter (Signed)
Pt calls and states for appr 2 days she has had a rash on her neck and her chest it changes in severity and at it's worse her neck swells, she states she would like to be seen for the rash, she  has  used OTC meds including benadryl with little improvement. There are no available appts in clinic at this time, she states she feels the rash is getting worse and her throat is swelling, she is going to ED, this is agreeable.

## 2014-12-30 NOTE — Telephone Encounter (Signed)
Agree with plan 

## 2014-12-30 NOTE — ED Provider Notes (Signed)
CSN: [id number]     Arrival date & time 12/30/14  1425 History   This chart was scribed for non-physician practitioner, Caryl Ada, PA-C working with [place], DO by Rayna Sexton, ED scribe. This patient was seen in room TR06C/TR06C and the patient's care was started at 3:23 PM.     Chief Complaint  Patient presents with  . Rash   The history is provided by the patient. No language interpreter was used.    HPI Comments: [name] is a 54 y.o. female who presents to the Emergency Department complaining of a moderate, worsening, rash and swelling to her left neck and chest with onset last night. She notes wearing a wooden cross necklace for 2 months around the affected area and additionally notes a bug bite with redness and swelling to the medial aspect of the upper right arm. She denies any recent exposure to plants or vines. She notes taking benadryl and hydroxyzine every 4 hours since the onset of the rash with little to no relief of symptoms. She also notes a history of rashes with 1 case of bed bugs and 1 allergic reaction to mango. She further notes currently taking pain medication for fibromyalgia as well as plantar fasciitis. She denies any other associated symptoms.   Past Medical History  Diagnosis Date  . Adhesive capsulitis of right shoulder     with underlying tendinopathy rotator cuff  . History of post-sterilization tuboplasty 2000  . Tear of meniscus of left knee     x2  . Tear of meniscus of right knee   . Plantar fasciitis     Right  . Shortness of breath     with exertion  . Diabetes mellitus     oral tx  . GERD (gastroesophageal reflux disease)   . Arthritis   . Sleep apnea 5 plus yrs    study -pt could not sleep test inconclusive.    Past Surgical History  Procedure Laterality Date  . Cholecystectomy    . Meniscus repair      R and L (L x2)  . Tonsillectomy    . Knee arthroscopy  10/10/2011    Procedure: ARTHROSCOPY KNEE;  Surgeon: Newt Minion, MD;  Location: [place];  Service: Orthopedics;  Laterality: Right;  Right Knee Arthroscopy and Excision Medial Meniscus  . Tubal ligation     Family History  Problem Relation Age of Onset  . Cancer Sister     both sisters have breast cancer   History  Substance Use Topics  . Smoking status: Never Smoker   . Smokeless tobacco: Never Used  . Alcohol Use: No   OB History    No data available     Review of Systems  Constitutional: Negative for fever and chills.  Skin: Positive for rash. Negative for color change and pallor.  All other systems reviewed and are negative.     Allergies  Ofloxacin  Home Medications   Prior to Admission medications   Medication Sig Start Date End Date Taking? Authorizing Provider  amitriptyline (ELAVIL) 25 MG tablet Take 2 tablets (50 mg total) by mouth at bedtime. 12/22/14   Charlesetta Shanks, MD  HYDROcodone-acetaminophen (NORCO) 10-325 MG per tablet Take 1 tablet by mouth 3 (three) times daily as needed for moderate pain. Do not fill until 30 days after last prescription was filled. 10/22/14   Lucious Groves, DO  insulin aspart (NOVOLOG) 100 UNIT/ML injection Inject 5 Units into the skin  3 (three) times daily with meals. 05/06/14 05/07/15  Lucious Groves, DO  insulin glargine (LANTUS) 100 UNIT/ML injection Inject 15 units at bedtime    Na Nicoletta Dress, MD  Liraglutide 18 MG/3ML SOPN Inject 0.3 mLs (1.8 mg total) into the skin daily. 12/17/14   Charlesetta Shanks, MD  lisinopril (PRINIVIL,ZESTRIL) 10 MG tablet Take 1 tablet (10 mg total) by mouth daily. 12/17/14 12/17/15  Langley Gauss Moding, MD  meloxicam (MOBIC) 15 MG tablet Take 1 tablet (15 mg total) by mouth daily. 10/19/14   Dickie La, MD  metFORMIN (GLUCOPHAGE XR) 500 MG 24 hr tablet Take 1 tablet (500 mg total) by mouth daily with breakfast. 10/22/14 10/22/15  Lucious Groves, DO  sitaGLIPtin (JANUVIA) 100 MG tablet Take 1 tablet (100 mg total) by mouth daily. 10/22/14   Lucious Groves, DO  traMADol (ULTRAM) 50 MG  tablet Take 2 tablets (100 mg total) by mouth every 8 (eight) hours as needed for moderate pain. Please do not refill until 30 days after last prescription was filled. 10/22/14   Lucious Groves, DO   BP 144/75 mmHg  Pulse 77  Temp(Src) 98 F (36.7 C) (Oral)  Resp 18  SpO2 96%  LMP 12/03/2014 Physical Exam  Constitutional: She is oriented to person, place, and time. She appears well-developed and well-nourished. No distress.  HENT:  Head: Normocephalic and atraumatic.  Mouth/Throat: Oropharynx is clear and moist.  Eyes: Conjunctivae and EOM are normal. Pupils are equal, round, and reactive to light.  Neck: Normal range of motion. Neck supple. No tracheal deviation present.  Cardiovascular: Normal rate and regular rhythm.   Pulmonary/Chest: Effort normal and breath sounds normal. No respiratory distress.  Abdominal: Soft.  Musculoskeletal: Normal range of motion.  Neurological: She is alert and oriented to person, place, and time.  Skin: Skin is warm and dry. Rash noted. There is erythema.  Erythema around neck circumferential Scattered red areas to anterior chest   Psychiatric: She has a normal mood and affect. Her behavior is normal.  Nursing note and vitals reviewed.   ED Course  Procedures  DIAGNOSTIC STUDIES: Oxygen Saturation is 96% on RA, normal by my interpretation.    COORDINATION OF CARE: 3:27 PM Discussed treatment plan with pt at bedside and pt agreed to plan.  Labs Review Labs Reviewed - No data to display  Imaging Review No results found.   EKG Interpretation None      MDM   Final diagnoses:  Contact dermatitis    Solumedrol im  Benadryl   I personally performed the services in this documentation, which was scribed in my presence.  The recorded information has been reviewed and considered.   Ronnald Collum.  Fransico Meadow, PA-C 12/30/14 [place], PA-C 12/30/14 [place], DO 01/07/15 [phone]

## 2014-12-30 NOTE — Discharge Instructions (Signed)

## 2014-12-30 NOTE — ED Notes (Signed)
Pt is here with left rash and swelling to neck area that started last nite.  No airway problems.  Also has bite to right upper arm.  No sob

## 2015-01-04 ENCOUNTER — Ambulatory Visit: Payer: Self-pay | Admitting: Internal Medicine

## 2015-01-10 ENCOUNTER — Encounter: Payer: Self-pay | Admitting: Internal Medicine

## 2015-01-10 ENCOUNTER — Ambulatory Visit (INDEPENDENT_AMBULATORY_CARE_PROVIDER_SITE_OTHER): Payer: Self-pay | Admitting: Internal Medicine

## 2015-01-10 VITALS — BP 127/64 | HR 72 | Temp 97.9°F | Ht 62.0 in | Wt 210.1 lb

## 2015-01-10 DIAGNOSIS — R21 Rash and other nonspecific skin eruption: Secondary | ICD-10-CM

## 2015-01-10 DIAGNOSIS — G4700 Insomnia, unspecified: Secondary | ICD-10-CM

## 2015-01-10 MED ORDER — TRIAMCINOLONE ACETONIDE 0.1 % EX CREA: TOPICAL_CREAM | CUTANEOUS | Status: DC

## 2015-01-10 NOTE — Progress Notes (Signed)
Patient ID: [name], female   DOB: [birth year], 54 y.o.   MRN: [id number]     Subjective:   Patient ID: [name] female    DOB: Nov 17, 1960 54 y.o.    MRN: [id number] Health Maintenance Due: Health Maintenance Due  Topic Date Due  . HIV Screening  06/29/1977  . MAMMOGRAM  06/29/2012  . COLONOSCOPY  06/29/2012  . PAP SMEAR  07/26/2013  . FOOT EXAM  09/16/2014    _________________________________________________  HPI: [name] is a 54 y.o. female here for an acute visit.  Pt has a PMH outlined below.  Please see problem-based charting assessment and plan note for further details of medical issues addressed at today's visit.  PMH: Past Medical History  Diagnosis Date  . Adhesive capsulitis of right shoulder     with underlying tendinopathy rotator cuff  . History of post-sterilization tuboplasty 2000  . Tear of meniscus of left knee     x2  . Tear of meniscus of right knee   . Plantar fasciitis     Right  . Shortness of breath     with exertion  . Diabetes mellitus     oral tx  . GERD (gastroesophageal reflux disease)   . Arthritis   . Sleep apnea 5 plus yrs    study -pt could not sleep test inconclusive.     Medications: Current Outpatient Prescriptions on File Prior to Visit  Medication Sig Dispense Refill  . amitriptyline (ELAVIL) 25 MG tablet Take 2 tablets (50 mg total) by mouth at bedtime. 60 tablet 2  . HYDROcodone-acetaminophen (NORCO) 10-325 MG per tablet Take 1 tablet by mouth 3 (three) times daily as needed for moderate pain. Do not fill until 30 days after last prescription was filled. 90 tablet 0  . insulin aspart (NOVOLOG) 100 UNIT/ML injection Inject 5 Units into the skin 3 (three) times daily with meals. 10 mL 3  . insulin glargine (LANTUS) 100 UNIT/ML injection Inject 15 units at bedtime 10 mL 11  . Liraglutide 18 MG/3ML SOPN Inject 0.3 mLs (1.8 mg total) into the skin daily. 6 mL 3  . lisinopril (PRINIVIL,ZESTRIL) 10  MG tablet Take 1 tablet (10 mg total) by mouth daily. 30 tablet 11  . meloxicam (MOBIC) 15 MG tablet Take 1 tablet (15 mg total) by mouth daily. 30 tablet 1  . metFORMIN (GLUCOPHAGE XR) 500 MG 24 hr tablet Take 1 tablet (500 mg total) by mouth daily with breakfast. 30 tablet 2  . sitaGLIPtin (JANUVIA) 100 MG tablet Take 1 tablet (100 mg total) by mouth daily. 30 tablet 11  . traMADol (ULTRAM) 50 MG tablet Take 2 tablets (100 mg total) by mouth every 8 (eight) hours as needed for moderate pain. Please do not refill until 30 days after last prescription was filled. 180 tablet 0   No current facility-administered medications on file prior to visit.    Allergies: Allergies  Allergen Reactions  . Ofloxacin     REACTION: rash, throat itching, tightness    FH: Family History  Problem Relation Age of Onset  . Cancer Sister     both sisters have breast cancer    SH: History   Social History  . Marital Status: Married    Spouse Name: N/A  . Number of Children: N/A  . Years of Education: GED +1 yr   Occupational History  . unemployed Unemployed   Social History Main Topics  . Smoking status: Never Smoker   . Smokeless tobacco:  Never Used  . Alcohol Use: No  . Drug Use: No  . Sexual Activity: Not on file   Other Topics Concern  . None   Social History Narrative   Married, unemployed, Husband disabled and paraplegic 2/2 fall from tree stand while deer hunting, Son quadriplegic 2/2 MVA 05/2007 in [place]: Constitutional: Negative for fever, chills and weight loss.  Eyes: Negative for blurred vision.  Respiratory: Negative for cough and shortness of breath.  Cardiovascular: Negative for chest pain, palpitations and leg swelling.  Gastrointestinal: Negative for nausea, vomiting, abdominal pain, diarrhea, constipation and blood in stool.  Genitourinary: Negative for dysuria, urgency and frequency.  Musculoskeletal: Negative for myalgias and +back pain.    Neurological: Negative for dizziness, weakness and headaches.     Objective:   Vital Signs: Filed Vitals:   01/10/15 1509  BP: 127/64  Pulse: 72  Temp: 97.9 F (36.6 C)  TempSrc: Oral  Height: 5\' 2"  (1.575 m)  Weight: 210 lb 1.6 oz (95.301 kg)  SpO2: 99%      BP Readings from Last 3 Encounters:  01/10/15 127/64  12/30/14 144/75  12/17/14 156/79    Physical Exam: Constitutional: Vital signs reviewed.  Patient is in NAD and cooperative with exam.  Head: Normocephalic and atraumatic. Eyes: PERRL, EOMI, conjunctivae nl, no scleral icterus.  Neck: Supple. Cardiovascular: RRR, no MRG. Pulmonary/Chest: normal effort, CTAB, no wheezes, rales, or rhonchi.   Abdominal: Soft. NT/ND +BS. Neurological: A&O x3, cranial nerves II-XII are grossly intact, moving all extremities. Extremities: 2+DP b/l; no pitting edema. Skin: Warm, dry and intact. Erythematous rash on the upper chest, without papules or vesicles.     Assessment & Plan:   Assessment and plan was discussed and formulated with my attending.

## 2015-01-10 NOTE — Patient Instructions (Signed)
Thank you for your visit today.   Please return to the internal medicine clinic in July with your primary physician or sooner if needed.     Your current medical regimen is effective;  continue present plan and take all medications as prescribed.    I have prescribed you a steroid cream for your rash.  Please use this 2-4 times daily until the rash is gone.  Do not use this on your face.    You may try melatonin for sleep which you may purchase over the counter in the pharmacy department.   Please be sure to bring all of your medications with you to every visit; this includes herbal supplements, vitamins, eye drops, and any over-the-counter medications.   Should you have any questions regarding your medications and/or any new or worsening symptoms, please be sure to call the clinic at [phone].   If you believe that you are suffering from a life threatening condition or one that may result in the loss of limb or function, then you should call 911 or proceed to the nearest Emergency Department.   Insomnia Insomnia is frequent trouble falling and/or staying asleep. Insomnia can be a long term problem or a short term problem. Both are common. Insomnia can be a short term problem when the wakefulness is related to a certain stress or worry. Long term insomnia is often related to ongoing stress during waking hours and/or poor sleeping habits. Overtime, sleep deprivation itself can make the problem worse. Every little thing feels more severe because you are overtired and your ability to cope is decreased. CAUSES   Stress, anxiety, and depression.  Poor sleeping habits.  Distractions such as TV in the bedroom.  Naps close to bedtime.  Engaging in emotionally charged conversations before bed.  Technical reading before sleep.  Alcohol and other sedatives. They may make the problem worse. They can hurt normal sleep patterns and normal dream activity.  Stimulants such as caffeine for  several hours prior to bedtime.  Pain syndromes and shortness of breath can cause insomnia.  Exercise late at night.  Changing time zones may cause sleeping problems (jet lag). It is sometimes helpful to have someone observe your sleeping patterns. They should look for periods of not breathing during the night (sleep apnea). They should also look to see how long those periods last. If you live alone or observers are uncertain, you can also be observed at a sleep clinic where your sleep patterns will be professionally monitored. Sleep apnea requires a checkup and treatment. Give your caregivers your medical history. Give your caregivers observations your family has made about your sleep.  SYMPTOMS   Not feeling rested in the morning.  Anxiety and restlessness at bedtime.  Difficulty falling and staying asleep. TREATMENT   Your caregiver may prescribe treatment for an underlying medical disorders. Your caregiver can give advice or help if you are using alcohol or other drugs for self-medication. Treatment of underlying problems will usually eliminate insomnia problems.  Medications can be prescribed for short time use. They are generally not recommended for lengthy use.  Over-the-counter sleep medicines are not recommended for lengthy use. They can be habit forming.  You can promote easier sleeping by making lifestyle changes such as:  Using relaxation techniques that help with breathing and reduce muscle tension.  Exercising earlier in the day.  Changing your diet and the time of your last meal. No night time snacks.  Establish a regular time to go to bed.  Counseling can help with stressful problems and worry.  Soothing music and white noise may be helpful if there are background noises you cannot remove.  Stop tedious detailed work at least one hour before bedtime. HOME CARE INSTRUCTIONS   Keep a diary. Inform your caregiver about your progress. This includes any medication  side effects. See your caregiver regularly. Take note of:  Times when you are asleep.  Times when you are awake during the night.  The quality of your sleep.  How you feel the next day. This information will help your caregiver care for you.  Get out of bed if you are still awake after 15 minutes. Read or do some quiet activity. Keep the lights down. Wait until you feel sleepy and go back to bed.  Keep regular sleeping and waking hours. Avoid naps.  Exercise regularly.  Avoid distractions at bedtime. Distractions include watching television or engaging in any intense or detailed activity like attempting to balance the household checkbook.  Develop a bedtime ritual. Keep a familiar routine of bathing, brushing your teeth, climbing into bed at the same time each night, listening to soothing music. Routines increase the success of falling to sleep faster.  Use relaxation techniques. This can be using breathing and muscle tension release routines. It can also include visualizing peaceful scenes. You can also help control troubling or intruding thoughts by keeping your mind occupied with boring or repetitive thoughts like the old concept of counting sheep. You can make it more creative like imagining planting one beautiful flower after another in your backyard garden.  During your day, work to eliminate stress. When this is not possible use some of the previous suggestions to help reduce the anxiety that accompanies stressful situations. MAKE SURE YOU:   Understand these instructions.  Will watch your condition.  Will get help right away if you are not doing well or get worse. Document Released: 06/29/2000 Document Revised: 09/24/2011 Document Reviewed: 07/30/2007 [facility] Patient Information 2015 [place], [place]. This information is not intended to replace advice given to you by your health care provider. Make sure you discuss any questions you have with your health care  provider.    Triamcinolone skin cream, ointment, lotion, or aerosol What is this medicine? TRIAMCINOLONE (trye am SIN oh lone) is a corticosteroid. It is used on the skin to reduce swelling, redness, itching, and allergic reactions. This medicine may be used for other purposes; ask your health care provider or pharmacist if you have questions. COMMON BRAND NAME(S): Aristocort, Aristocort A, Aristocort HP, Cinalog, Cinolar, DERMASORB TA Complete, Flutex, Kenalog, Pediaderm TA, SP Rx 228, Triacet, Trianex, Triderm What should I tell my health care provider before I take this medicine? They need to know if you have any of these conditions: -diabetes -infection, like tuberculosis, herpes, or fungal infection -large areas of burned or damaged skin -skin wasting or thinning -an unusual or allergic reaction to triamcinolone, corticosteroids, other medicines, foods, dyes, or preservatives -pregnant or trying to get pregnant -breast-feeding How should I use this medicine? This medicine is for external use only. Do not take by mouth. Follow the directions on the prescription label. Wash your hands before and after use. Apply a thin film of medicine to the affected area. Do not cover with a bandage or dressing unless your doctor or health care professional tells you to. Do not use on healthy skin or over large areas of skin. Do not get this medicine in your eyes. If you do, rinse out  with plenty of cool tap water. It is important not to use more medicine than prescribed. Do not use your medicine more often than directed. Talk to your pediatrician regarding the use of this medicine in children. Special care may be needed. Elderly patients are more likely to have damaged skin through aging, and this may increase side effects. This medicine should only be used for brief periods and infrequently in older patients. Overdosage: If you think you have taken too much of this medicine contact a poison control  center or emergency room at once. NOTE: This medicine is only for you. Do not share this medicine with others. What if I miss a dose? If you miss a dose, use it as soon as you can. If it is almost time for your next dose, use only that dose. Do not use double or extra doses. What may interact with this medicine? Interactions are not expected. This list may not describe all possible interactions. Give your health care provider a list of all the medicines, herbs, non-prescription drugs, or dietary supplements you use. Also tell them if you smoke, drink alcohol, or use illegal drugs. Some items may interact with your medicine. What should I watch for while using this medicine? Tell your doctor or health care professional if your symptoms do not start to get better within one week. Do not use for more than 14 days. Do not use on healthy skin or over large areas of skin. Tell your doctor or health care professional if you are exposed to anyone with measles or chickenpox, or if you develop sores or blisters that do not heal properly. Do not use an airtight bandage to cover the affected area unless your doctor or health care professional tells you to. If you are to cover the area, follow the instructions carefully. Covering the area where the medicine is applied can increase the amount that passes through the skin and increases the risk of side effects. If treating the diaper area of a child, avoid covering the treated area with tight-fitting diapers or plastic pants. This may increase the amount of medicine that passes through the skin and increase the risk of serious side effects. What side effects may I notice from receiving this medicine? Side effects that you should report to your doctor or health care professional as soon as possible: -burning or itching of the skin -dark red spots on the skin -infection -painful, red, pus filled blisters in hair follicles -thinning of the skin, sunburn more likely  especially on the face Side effects that usually do not require medical attention (report to your doctor or health care professional if they continue or are bothersome): -dry skin, irritation -unusual increased growth of hair on the face or body This list may not describe all possible side effects. Call your doctor for medical advice about side effects. You may report side effects to FDA at 1-800-FDA-1088. Where should I keep my medicine? Keep out of the reach of children. Store at room temperature between 15 and 30 degrees C (59 and 86 degrees F). Do not freeze. Throw away any unused medicine after the expiration date. NOTE: This sheet is a summary. It may not cover all possible information. If you have questions about this medicine, talk to your doctor, pharmacist, or health care provider.  2015, [employer] Standard. (2013-10-22 15:59:51)

## 2015-01-12 NOTE — Assessment & Plan Note (Signed)
Pt here for acute visit for a rash that appeared about 2 weeks ago.  She went to the ED about a week ago and was given a steroid injection.  Describes the rash on her upper chest as extremely pruritic, erythematous, and initially very swollen prior to receiving the steroid injection.  She reports the swelling has gone down but is still extremely pruritic.  Has tried some OTC cream without relief.  Denies fever/chills.  Has experienced a periorbital rash in the past which spread to hand/legs.  Denies any new soaps, detergents, perfumes, medications, foods.  She does spend time outside and the rash usually appears in the summer months.  She also has some American [place] heritage.  Also states she began wearing a wood cross around the time the rash appeared.  Possibilities include contact dermatitis, polymorphous light eruption, dermatomyositis (no Gottron's papules or muscle weakness).   -will give triamcinolone cream 0.1% to see if this helps -has been referred to dermatology in the past but no-showed -consider future dermatology referral if no relief from triamcinolone

## 2015-01-12 NOTE — Assessment & Plan Note (Signed)
Pt still having difficulty sleeping especially exacerbated by recent rash.  Has tried some meds in the past without relief.  Also discussed and provided her with sleep hygiene tools but reports that she already does all of those things. She has been more irritable with family/friends.  I would like to try rozerem but without insurance this may be difficult.  Discussed using OTC melatonin and she agreed to try.   -rec sleep hygiene and provided info  -try melatonin PRN for sleep -discussed referral to counselor for additional help

## 2015-01-13 NOTE — Progress Notes (Signed)
Internal Medicine Clinic Attending  Case discussed with Dr. Gill soon after the resident saw the patient.  We reviewed the resident's history and exam and pertinent patient test results.  I agree with the assessment, diagnosis, and plan of care documented in the resident's note.  

## 2015-01-20 ENCOUNTER — Encounter: Payer: Self-pay | Admitting: Internal Medicine

## 2015-01-27 ENCOUNTER — Ambulatory Visit (INDEPENDENT_AMBULATORY_CARE_PROVIDER_SITE_OTHER): Payer: Self-pay | Admitting: Internal Medicine

## 2015-01-27 ENCOUNTER — Encounter: Payer: Self-pay | Admitting: Internal Medicine

## 2015-01-27 VITALS — BP 122/69 | HR 77 | Temp 97.7°F | Ht 62.0 in | Wt 206.8 lb

## 2015-01-27 DIAGNOSIS — I10 Essential (primary) hypertension: Secondary | ICD-10-CM

## 2015-01-27 DIAGNOSIS — M722 Plantar fascial fibromatosis: Secondary | ICD-10-CM

## 2015-01-27 DIAGNOSIS — M549 Dorsalgia, unspecified: Secondary | ICD-10-CM

## 2015-01-27 DIAGNOSIS — F4320 Adjustment disorder, unspecified: Secondary | ICD-10-CM

## 2015-01-27 DIAGNOSIS — G4700 Insomnia, unspecified: Secondary | ICD-10-CM

## 2015-01-27 DIAGNOSIS — G8929 Other chronic pain: Secondary | ICD-10-CM

## 2015-01-27 DIAGNOSIS — E1165 Type 2 diabetes mellitus with hyperglycemia: Secondary | ICD-10-CM

## 2015-01-27 DIAGNOSIS — IMO0002 Reserved for concepts with insufficient information to code with codable children: Secondary | ICD-10-CM

## 2015-01-27 LAB — POCT GLYCOSYLATED HEMOGLOBIN (HGB A1C): Hemoglobin A1C: 8.6

## 2015-01-27 LAB — GLUCOSE, CAPILLARY: Glucose-Capillary: 359 mg/dL — ABNORMAL HIGH (ref 65–99)

## 2015-01-27 MED ORDER — MELOXICAM 15 MG PO TABS: 15.0000 mg | ORAL_TABLET | Freq: Every day | ORAL | Status: DC

## 2015-01-27 MED ORDER — TRAMADOL HCL 50 MG PO TABS: 100.0000 mg | ORAL_TABLET | Freq: Three times a day (TID) | ORAL | Status: DC | PRN

## 2015-01-27 MED ORDER — HYDROCODONE-ACETAMINOPHEN 10-325 MG PO TABS: 1.0000 | ORAL_TABLET | Freq: Three times a day (TID) | ORAL | Status: DC | PRN

## 2015-01-27 NOTE — Assessment & Plan Note (Addendum)
Patient has been having trouble sleeping for quite sometime, but has worsened over the last 2 months due to the unfortunate passing of her son. She states that she has trouble falling and staying asleep. She has had sleep apnea studies in the past which were inconclusive. She was started on melatonin after last visit but she did not like it. She also was given Amitriptyline 50 mg at bedtime for fibromyalgia and to help with sleep. She states that it has not helped much. She also reports that she is seeing a Barrister's clerk.  We will try Amitriptyline again with increase in dose from 50 mg to 75 mg and she agrees. Also advised patient to contact us if she would like to speak with other grief counselors.  Will follow up in 1 month.

## 2015-01-27 NOTE — Assessment & Plan Note (Signed)
The patient has chronic back pain and has been on Tramadol and Norco provided by our clinic for some time. She has a pain contract with [name] and had a urine drug screen in April.  Her last refill date was on 12/28/2014. She states she is continuing to have back pain and that these medications have helped her. She also uses [employer] which helps some. Patient states she tried Percocet in the past but did not like that it made her feel woozy.  Will refill 1 month supply for her: -Tramadol 50 mg (#180) 2 pills every 8 hours as needed -Norco (Hydrocodone-Acetaminophen) 10-325 mg (#90) 1 pill every 8 hours as needed

## 2015-01-27 NOTE — Patient Instructions (Signed)
Thank you for your visit.  For your sleep, try increasing Amitriptyline (ELAVIL) to 75 mg and see how you feel.   I am refilling your Tramadol and Norco for 1 month supply.  We have given you 1 vile of Lantus sample. Try to use your Novolog everyday.  I have sent a prescription to [facility] for Meloxicam.  You may discontinue the Lisinopril, continue checking your blood pressure daily.  It was nice talking to you, if you need any other information for grief counseling, please let us know.

## 2015-01-27 NOTE — Assessment & Plan Note (Addendum)
Patient is out of Meloxicam which helps her pain.  Will refill Meloxicam 15 mg daily.

## 2015-01-27 NOTE — Assessment & Plan Note (Signed)
Patient states she was started on Lisinopril 10 mg daily last month after multiple high blood pressure readings. She reports that at that time she had been eating multiple bags of sunflower seeds a day. She stopped the sunflower seeds and says that she noticed her blood pressures dropping low with lisinopril. She checks her blood pressure daily and has not taken lisinopril for the last month.  Her blood pressure today is 122/69.  We will discontinue her Lisinopril and advised her to continue checking her blood pressure daily.

## 2015-01-27 NOTE — Assessment & Plan Note (Signed)
Her Hgb A1C today is 8.6, up from 8.2 in April. She states that she has run out of her Lantus last month as well as her meter strips. She also admits that she has not taken her Novolog 5 Units with meals consistently. She states that she discontinued her metformin due to intolerance (vomiting/diarrhea). She used Januvia but has not recently due to cost. She is still taking Victoza 1.8 mg.  She states her blood glucose last night was 230. Today it is 359, patient states she did eat before her visit.  We have given her a sample of 1 vile of Lantus and syringes as well. We will not make any changes to her medications at this time, but advised her to take her Novolog regularly, and she states she will try. She is also in the process of renewing her [employer].  Will follow up in 1 month.

## 2015-01-27 NOTE — Progress Notes (Signed)
Patient ID: [name], female   DOB: Oct 23, 1960, 54 y.o.   MRN: [id number]   Subjective:   Patient ID: [name] female   DOB: 20-Mar-1961 54 y.o.   MRN: [id number]  HPI: [name] is a 54 y.o. pleasant female with PMH as listed below who is visiting [name] for a 1 month follow up with chief complaint of insomnia and medication refill.  Please see problem based charting for discussion.  Past Medical History  Diagnosis Date  . Adhesive capsulitis of right shoulder     with underlying tendinopathy rotator cuff  . History of post-sterilization tuboplasty 2000  . Tear of meniscus of left knee     x2  . Tear of meniscus of right knee   . Plantar fasciitis     Right  . Shortness of breath     with exertion  . Diabetes mellitus     oral tx  . GERD (gastroesophageal reflux disease)   . Arthritis   . Sleep apnea 5 plus yrs    study -pt could not sleep test inconclusive.    Current Outpatient Prescriptions  Medication Sig Dispense Refill  . amitriptyline (ELAVIL) 25 MG tablet Take 2 tablets (50 mg total) by mouth at bedtime. 60 tablet 2  . HYDROcodone-acetaminophen (NORCO) 10-325 MG per tablet Take 1 tablet by mouth 3 (three) times daily as needed for moderate pain. Do not fill until 30 days after last prescription was filled. 90 tablet 0  . insulin aspart (NOVOLOG) 100 UNIT/ML injection Inject 5 Units into the skin 3 (three) times daily with meals. 10 mL 3  . insulin glargine (LANTUS) 100 UNIT/ML injection Inject 15 units at bedtime 10 mL 11  . Liraglutide 18 MG/3ML SOPN Inject 0.3 mLs (1.8 mg total) into the skin daily. 6 mL 3  . lisinopril (PRINIVIL,ZESTRIL) 10 MG tablet Take 1 tablet (10 mg total) by mouth daily. 30 tablet 11  . meloxicam (MOBIC) 15 MG tablet Take 1 tablet (15 mg total) by mouth daily. 30 tablet 1  . metFORMIN (GLUCOPHAGE XR) 500 MG 24 hr tablet Take 1 tablet (500 mg total) by mouth daily with breakfast. 30 tablet 2  . sitaGLIPtin (JANUVIA)  100 MG tablet Take 1 tablet (100 mg total) by mouth daily. 30 tablet 11  . traMADol (ULTRAM) 50 MG tablet Take 2 tablets (100 mg total) by mouth every 8 (eight) hours as needed for moderate pain. Please do not refill until 30 days after last prescription was filled. 180 tablet 0  . triamcinolone cream (KENALOG) 0.1 % Apply to the affected area 2 to 4 times daily.  Do not apply to the face. 30 g 0   No current facility-administered medications for this visit.   Family History  Problem Relation Age of Onset  . Cancer Sister     both sisters have breast cancer   History   Social History  . Marital Status: Married    Spouse Name: N/A  . Number of Children: N/A  . Years of Education: GED +1 yr   Occupational History  . unemployed Unemployed   Social History Main Topics  . Smoking status: Never Smoker   . Smokeless tobacco: Never Used  . Alcohol Use: No  . Drug Use: No  . Sexual Activity: Not on file   Other Topics Concern  . None   Social History Narrative   Married, unemployed, Husband disabled and paraplegic 2/2 fall from tree stand while deer hunting, Son quadriplegic 2/2 MVA  05/2007 in [place]   Review of Systems: Review of Systems  Constitutional: Negative for fever, chills and diaphoresis.  HENT: Negative for congestion, ear pain and sore throat.   Eyes: Negative for blurred vision.  Respiratory: Negative for cough, sputum production, shortness of breath and wheezing.   Cardiovascular: Negative for chest pain, palpitations and leg swelling.  Gastrointestinal: Negative for nausea, vomiting, abdominal pain, diarrhea, constipation and blood in stool.  Genitourinary: Negative for dysuria, urgency, frequency and hematuria.  Musculoskeletal: Positive for back pain and joint pain. Negative for myalgias and falls.  Skin: Positive for rash.  Neurological: Negative for dizziness and headaches.  Psychiatric/Behavioral: The patient has insomnia.        Anxiety and Depressed  mood.    Objective:  Physical Exam: Filed Vitals:   01/27/15 1501  BP: 122/69  Pulse: 77  Temp: 97.7 F (36.5 C)  TempSrc: Oral  Height: 5\' 2"  (1.575 m)  Weight: 206 lb 12.8 oz (93.804 kg)  SpO2: 100%   Physical Exam  Constitutional: She is oriented to person, place, and time. She appears well-developed and well-nourished. She is cooperative.  HENT:  Head: Normocephalic and atraumatic.  Eyes: EOM are normal.  Cardiovascular: Normal rate, regular rhythm and normal heart sounds.   Pulmonary/Chest: Effort normal and breath sounds normal.  Abdominal: Soft. Bowel sounds are normal. There is no tenderness.  Musculoskeletal: Normal range of motion. She exhibits no edema or tenderness.  Neurological: She is alert and oriented to person, place, and time.  Skin: Skin is warm.  Small red bumps around neck.  Psychiatric: Her behavior is normal. She exhibits a depressed mood.    Assessment & Plan:  Please see problem based charting for current assessment and plan.

## 2015-01-28 NOTE — Progress Notes (Signed)
Internal Medicine Clinic Attending  I saw and evaluated the patient.  I personally confirmed the key portions of the history and exam documented by Dr. Patel,Vishal and I reviewed pertinent patient test results.  The assessment, diagnosis, and plan were formulated together and I agree with the documentation in the resident's note.  

## 2015-02-07 ENCOUNTER — Other Ambulatory Visit: Payer: Self-pay | Admitting: *Deleted

## 2015-02-07 DIAGNOSIS — IMO0002 Reserved for concepts with insufficient information to code with codable children: Secondary | ICD-10-CM

## 2015-02-07 DIAGNOSIS — E1165 Type 2 diabetes mellitus with hyperglycemia: Secondary | ICD-10-CM

## 2015-02-08 MED ORDER — INSULIN GLARGINE 100 UNIT/ML ~~LOC~~ SOLN: SUBCUTANEOUS | Status: DC

## 2015-02-08 MED ORDER — INSULIN SYRINGES (DISPOSABLE) U-100 1 ML MISC
Status: DC
Start: 2015-02-08 — End: 2015-12-28

## 2015-02-10 ENCOUNTER — Other Ambulatory Visit: Payer: Self-pay | Admitting: Internal Medicine

## 2015-03-17 ENCOUNTER — Other Ambulatory Visit: Payer: Self-pay | Admitting: *Deleted

## 2015-03-17 ENCOUNTER — Ambulatory Visit: Payer: Self-pay

## 2015-03-17 DIAGNOSIS — M549 Dorsalgia, unspecified: Principal | ICD-10-CM

## 2015-03-17 DIAGNOSIS — G8929 Other chronic pain: Secondary | ICD-10-CM

## 2015-03-18 MED ORDER — HYDROCODONE-ACETAMINOPHEN 10-325 MG PO TABS: 1.0000 | ORAL_TABLET | Freq: Three times a day (TID) | ORAL | Status: DC | PRN

## 2015-03-18 MED ORDER — TRAMADOL HCL 50 MG PO TABS: 100.0000 mg | ORAL_TABLET | Freq: Three times a day (TID) | ORAL | Status: DC | PRN

## 2015-03-18 NOTE — Telephone Encounter (Signed)
Pt aware.

## 2015-03-23 ENCOUNTER — Ambulatory Visit (HOSPITAL_COMMUNITY)
Admission: RE | Admit: 2015-03-23 | Discharge: 2015-03-23 | Disposition: A | Discharge status: Home / Self Care | Payer: Self-pay | Source: Ambulatory Visit | Attending: Internal Medicine | Admitting: Internal Medicine

## 2015-03-23 ENCOUNTER — Ambulatory Visit (INDEPENDENT_AMBULATORY_CARE_PROVIDER_SITE_OTHER): Payer: Self-pay | Admitting: Internal Medicine

## 2015-03-23 ENCOUNTER — Encounter: Payer: Self-pay | Admitting: Internal Medicine

## 2015-03-23 VITALS — BP 104/72 | HR 84 | Temp 97.8°F | Ht 62.0 in | Wt 204.6 lb

## 2015-03-23 DIAGNOSIS — E1165 Type 2 diabetes mellitus with hyperglycemia: Secondary | ICD-10-CM

## 2015-03-23 DIAGNOSIS — M545 Low back pain: Secondary | ICD-10-CM

## 2015-03-23 DIAGNOSIS — M549 Dorsalgia, unspecified: Principal | ICD-10-CM

## 2015-03-23 DIAGNOSIS — L299 Pruritus, unspecified: Secondary | ICD-10-CM

## 2015-03-23 DIAGNOSIS — G8929 Other chronic pain: Secondary | ICD-10-CM

## 2015-03-23 DIAGNOSIS — Z0000 Encounter for general adult medical examination without abnormal findings: Secondary | ICD-10-CM

## 2015-03-23 DIAGNOSIS — Z23 Encounter for immunization: Secondary | ICD-10-CM

## 2015-03-23 DIAGNOSIS — IMO0002 Reserved for concepts with insufficient information to code with codable children: Secondary | ICD-10-CM

## 2015-03-23 NOTE — Assessment & Plan Note (Signed)
Patient again states that she has not been able to take her Lantus as she is out and cannot afford it. She has also run out of her test strips. She has usually been taking Lantus 15 units at night and novolog 5 units with meals, but has been compensating while out of lantus by increasing her Novolog at night between 5-15 units. She reports checking her blood sugar once a day at night with numbers usually between 200-250. Denies any lows. She has the [employer] now.  -Patient given Lantus solostar sample in clinic and advised to continue regular regimen of Lantus 15 units qhs and Novolog 5 units with meals -Patient also advised to work with MAP program to search for more affordable avenues for Lantus -May need to consider change to cheaper insulin such as 70/30 NPH, as this is second visit where she has been off her medication for several weeks due to cost of Lantus -Patient advised to keep [name] informed on whether she is able to obtain Lantus through MAP or health department -Follow up in 1 month

## 2015-03-23 NOTE — Assessment & Plan Note (Signed)
Patient's chronic back pain has worsened in the last 2 weeks. She takes Norco 10-325 mg q8h prn which provides some relief for about 4-6 hours. She has tenderness to palpation of the spinous processes in the lumbosacral spine, but no paraspinal tenderness. She denies radiating pain and straight leg test is negative. I doubt this is radiculopathy. Previous lumbar x-ray in 2013 showed only mild degenerative changes.  -XRay lumbar spine today -> Stable minimal dextroscoliosis. Normal anterior-posterior alignment. Save mild L5-S1 facet arthropathy. -MRI lumbar spine -Patient advised to make appointment with her Sports Medicine physician, may need to consider steroid injection -Continue Norco 10-325 mg q8h prn pain

## 2015-03-23 NOTE — Progress Notes (Signed)
Patient ID: [name], female   DOB: [birth year], 54 y.o.   MRN: [id number]   Subjective:   Patient ID: [name] female   DOB: Oct 06, 1960 54 y.o.   MRN: [id number]  HPI: [name] is a 54 y.o. lady with PMH as listed below who presents for follow up with chief complaints of worsening back pain, generalized itching, and inability to afford insulin.  Please see problem list for details.    Past Medical History  Diagnosis Date  . Adhesive capsulitis of right shoulder     with underlying tendinopathy rotator cuff  . History of post-sterilization tuboplasty 2000  . Tear of meniscus of left knee     x2  . Tear of meniscus of right knee   . Plantar fasciitis     Right  . Shortness of breath     with exertion  . Diabetes mellitus     oral tx  . GERD (gastroesophageal reflux disease)   . Arthritis   . Sleep apnea 5 plus yrs    study -pt could not sleep test inconclusive.    Current Outpatient Prescriptions  Medication Sig Dispense Refill  . amitriptyline (ELAVIL) 25 MG tablet Take 2 tablets (50 mg total) by mouth at bedtime. 60 tablet 2  . HYDROcodone-acetaminophen (NORCO) 10-325 MG per tablet Take 1 tablet by mouth 3 (three) times daily as needed for moderate pain. Do not fill until 30 days after last prescription was filled. 90 tablet 0  . insulin aspart (NOVOLOG) 100 UNIT/ML injection Inject 5 Units into the skin 3 (three) times daily with meals. 10 mL 3  . insulin glargine (LANTUS) 100 UNIT/ML injection Inject 15 units at bedtime 10 mL 11  . Insulin Syringes, Disposable, U-100 1 ML MISC Use as directed. 500 each 0  . Liraglutide 18 MG/3ML SOPN Inject 0.3 mLs (1.8 mg total) into the skin daily. 6 mL 3  . meloxicam (MOBIC) 15 MG tablet Take 1 tablet (15 mg total) by mouth daily. 30 tablet 1  . metFORMIN (GLUCOPHAGE XR) 500 MG 24 hr tablet Take 1 tablet (500 mg total) by mouth daily with breakfast. 30 tablet 2  . sitaGLIPtin (JANUVIA) 100 MG tablet Take  1 tablet (100 mg total) by mouth daily. 30 tablet 11  . traMADol (ULTRAM) 50 MG tablet Take 2 tablets (100 mg total) by mouth every 8 (eight) hours as needed for moderate pain. Please do not refill until 30 days after last prescription was filled. 180 tablet 0  . triamcinolone cream (KENALOG) 0.1 % Apply to the affected area 2 to 4 times daily.  Do not apply to the face. 30 g 0   No current facility-administered medications for this visit.   Family History  Problem Relation Age of Onset  . Cancer Sister     both sisters have breast cancer   Social History   Social History  . Marital Status: Married    Spouse Name: N/A  . Number of Children: N/A  . Years of Education: GED +1 yr   Occupational History  . unemployed Unemployed   Social History Main Topics  . Smoking status: Never Smoker   . Smokeless tobacco: Never Used  . Alcohol Use: No  . Drug Use: No  . Sexual Activity: Not Asked   Other Topics Concern  . None   Social History Narrative   Married, unemployed, Husband disabled and paraplegic 2/2 fall from tree stand while deer hunting, Son quadriplegic 2/2 MVA 05/2007 in [place]  Review of Systems: Review of Systems  Constitutional: Negative for fever, chills and weight loss.  HENT: Negative for sore throat.   Respiratory: Negative for cough, sputum production, shortness of breath and wheezing.   Cardiovascular: Negative for chest pain and palpitations.  Gastrointestinal: Negative for nausea, vomiting, abdominal pain, diarrhea and constipation.  Genitourinary: Negative for dysuria.  Musculoskeletal: Positive for back pain and joint pain.  Skin: Positive for itching. Negative for rash.  Neurological: Negative for dizziness and headaches.    Objective:  Physical Exam: Filed Vitals:   03/23/15 1424  BP: 104/72  Pulse: 84  Temp: 97.8 F (36.6 C)  TempSrc: Oral  Height: 5\' 2"  (1.575 m)  Weight: 204 lb 9.6 oz (92.806 kg)  SpO2: 100%   Physical Exam    Constitutional: She is oriented to person, place, and time. She appears well-developed and well-nourished.  HENT:  Head: Normocephalic and atraumatic.  Cardiovascular: Normal rate, regular rhythm and normal heart sounds.   Pulmonary/Chest: Effort normal and breath sounds normal.  Abdominal: Soft. There is no tenderness.  Musculoskeletal:       Lumbar back: She exhibits tenderness and pain.  Neurological: She is alert and oriented to person, place, and time. She has normal strength. No sensory deficit.  Negative straight leg test.  Skin: Skin is warm. No rash noted. No erythema.    Assessment & Plan:  Please see problem based charting for assessment and plan.

## 2015-03-23 NOTE — Patient Instructions (Signed)
It was good to see you again [name].  We will get an X-Ray and MRI of your lower back. Please try to schedule an appointment with your Sports Medicine doctor as well.  For your itching, we will check a couple blood tests and you may continue to use Benadryl.  For your diabetes, we have given you a Lantus pen sample. Please try to get in touch with the MAP program to see if they can provide cheaper insulin options.  I will refill your test strips as well.  Please follow up with [name] in 1 month.

## 2015-03-23 NOTE — Assessment & Plan Note (Signed)
Patient complains of 1 month history of generalized itching that comes and goes without any noticeable rash during this time. Pruritis affects her neck, chest, back, and legs. After this started, she has changed her soaps, detergent, bed sheets without relief and denies any changes in perfume, lotions, or insect/tick bites. She does report recent travel to [place] and family history of Lupus in 2 sisters. Denies history of liver disease and reports cholecystectomy about 20 years ago due to gallstones and inflammation. She says her pruritis is only relieved with Benadryl. As patient has no typical rash or symptoms for Lupus, will not test for now but have to keep in mind due to family history.   -CBC, CMP today -Continue benadryl

## 2015-03-23 NOTE — Assessment & Plan Note (Signed)
Flu shot today 

## 2015-03-24 ENCOUNTER — Other Ambulatory Visit: Payer: Self-pay | Admitting: Internal Medicine

## 2015-03-24 LAB — CMP14 + ANION GAP
ALT: 24 IU/L (ref 0–32)
AST: 28 IU/L (ref 0–40)
Albumin/Globulin Ratio: 1.3 (ref 1.1–2.5)
Albumin: 3.9 g/dL (ref 3.5–5.5)
Alkaline Phosphatase: 114 IU/L (ref 39–117)
Anion Gap: 18 mmol/L (ref 10.0–18.0)
BUN/Creatinine Ratio: 17 (ref 9–23)
BUN: 12 mg/dL (ref 6–24)
Bilirubin Total: 0.5 mg/dL (ref 0.0–1.2)
CO2: 18 mmol/L (ref 18–29)
Calcium: 9 mg/dL (ref 8.7–10.2)
Chloride: 96 mmol/L — ABNORMAL LOW (ref 97–108)
Creatinine, Ser: 0.69 mg/dL (ref 0.57–1.00)
GFR calc Af Amer: 116 mL/min/{1.73_m2} (ref >59)
GFR calc non Af Amer: 100 mL/min/{1.73_m2} (ref >59)
Globulin, Total: 3 g/dL (ref 1.5–4.5)
Glucose: 323 mg/dL — ABNORMAL HIGH (ref 65–99)
Potassium: 4.2 mmol/L (ref 3.5–5.2)
Sodium: 132 mmol/L — ABNORMAL LOW (ref 134–144)
Total Protein: 6.9 g/dL (ref 6.0–8.5)

## 2015-03-24 LAB — CBC
Hematocrit: 47.4 % — ABNORMAL HIGH (ref 34.0–46.6)
Hemoglobin: 15.5 g/dL (ref 11.1–15.9)
MCH: 29 pg (ref 26.6–33.0)
MCHC: 32.7 g/dL (ref 31.5–35.7)
MCV: 89 fL (ref 79–97)
Platelets: 330 10*3/uL (ref 150–379)
RBC: 5.35 x10E6/uL — ABNORMAL HIGH (ref 3.77–5.28)
RDW: 12.8 % (ref 12.3–15.4)
WBC: 8.8 10*3/uL (ref 3.4–10.8)

## 2015-03-24 NOTE — Telephone Encounter (Signed)
Pt called requesting hydroxyzine tim and presto strips to be filled.

## 2015-03-24 NOTE — Telephone Encounter (Signed)
Tried to call pt but no answer.  Message left.

## 2015-03-25 ENCOUNTER — Encounter: Payer: Self-pay | Admitting: Family Medicine

## 2015-03-25 ENCOUNTER — Ambulatory Visit (INDEPENDENT_AMBULATORY_CARE_PROVIDER_SITE_OTHER): Payer: Self-pay | Admitting: Family Medicine

## 2015-03-25 ENCOUNTER — Telehealth: Payer: Self-pay | Admitting: Internal Medicine

## 2015-03-25 ENCOUNTER — Other Ambulatory Visit: Payer: Self-pay | Admitting: *Deleted

## 2015-03-25 VITALS — BP 142/84 | HR 68 | Ht 62.0 in | Wt 204.0 lb

## 2015-03-25 DIAGNOSIS — M549 Dorsalgia, unspecified: Secondary | ICD-10-CM

## 2015-03-25 DIAGNOSIS — G8929 Other chronic pain: Secondary | ICD-10-CM

## 2015-03-25 MED ORDER — CYCLOBENZAPRINE HCL 10 MG PO TABS: 10.0000 mg | ORAL_TABLET | Freq: Two times a day (BID) | ORAL | Status: DC

## 2015-03-25 NOTE — Progress Notes (Signed)
   [name] - 54 y.o. female MRN [id number]  Date of birth: Nov 15, 1960  [name] is a 54 y.o. who presents today for ongoing low back pain.  Lumbar back pain, initial visit-patient states she has had years of low back pain. This is localized in the lumbar region. She denies any paresthesias, dysesthesias, shooting pains going down either leg. In the middle of August she states that she started to have worsening low back pain without an inciting injury. Still denies any type of sciatic-like symptoms. She has continued with her Vicodin, tramadol, ice to the area with minimal relief. She does have a past medical history that is contributory of fibromyalgia.  Pt denies any current bowel/bladder problems, fever, chills, unintentional weight loss, night time awakenings secondary to pain, weakness in one or both legs.  Pain is worse with forward flexion but she is not getting pain with extension.  12 point review of systems negative other than history of present illness.   Past Medical History  Diagnosis Date  . Adhesive capsulitis of right shoulder     with underlying tendinopathy rotator cuff  . History of post-sterilization tuboplasty 2000  . Tear of meniscus of left knee     x2  . Tear of meniscus of right knee   . Plantar fasciitis     Right  . Shortness of breath     with exertion  . Diabetes mellitus     oral tx  . GERD (gastroesophageal reflux disease)   . Arthritis   . Sleep apnea 5 plus yrs    study -pt could not sleep test inconclusive.    Medications reviewed and updated.  Physical Exam Filed Vitals:   03/25/15 1053  BP: 142/84  Pulse: 68    Gen: NAD, Well nourished, Well developed Cardiorespiratory - Normal respiratory effort/rate.  RRR Neuro: CN 2-12 intact, MS 5/5 B/L UE and LE, +2 patellar and achilles relfex b/l  Back Exam: 1.Gait normal without antalgic aspect able to walk on heels and toes. 2. TTP along Lumbar Vertebrae - positive paraspinal  lumbar region. Most likely spinalis 3. Pain with : Flexion 4. One Legged Hyperextension for Spondy - negative  5. Straight Leg Raise- negative bilateral 6. Sitting Leg Raise - negative 7. DTR - 2/4 bilateral lower extremity 8. MS - 5/5 bilateral lower extremity 9. Vascular Exam : DP and PT +2 B/L

## 2015-03-25 NOTE — Telephone Encounter (Signed)
Please call pt back.

## 2015-03-25 NOTE — Telephone Encounter (Signed)
Have called pt 2 times, no answer

## 2015-03-25 NOTE — Assessment & Plan Note (Signed)
Patient's pain is consistent with a paraspinal muscle spasm. There is really no underlying degenerative joint change other than very mild age-related L5/S1 facet arthropathy. There is not a history that is consistent with a herniated nucleus pulposis. No red flags on exam or history today. -Continue with current medication and we will add a muscle relaxer along with back stretches. -MRI ordered by her PCP which she can obtain however I don't think this is necessary. -Follow-up in 4 weeks to reevaluate

## 2015-03-25 NOTE — Progress Notes (Signed)
Patient ID: [name], female   DOB: November 05, 1960, 54 y.o.   MRN: [id number] [facility]: Attending Note: I have reviewed the chart, discussed wit the Sports Medicine Fellow. I agree with assessment and treatment plan as detailed in the [place] note.

## 2015-03-25 NOTE — Telephone Encounter (Signed)
duplixcate

## 2015-03-28 ENCOUNTER — Ambulatory Visit: Payer: Self-pay | Admitting: Family Medicine

## 2015-03-29 MED ORDER — GLUCOSE BLOOD VI STRP: ORAL_STRIP | Status: DC

## 2015-03-29 MED ORDER — HYDROXYZINE HCL 10 MG PO TABS: 10.0000 mg | ORAL_TABLET | Freq: Three times a day (TID) | ORAL | Status: DC | PRN

## 2015-03-29 NOTE — Telephone Encounter (Signed)
Pt also asking for Hydroxyzine for itching.  Both test strips and med were removed from med list.

## 2015-04-01 NOTE — Progress Notes (Signed)
Internal Medicine Clinic Attending  I saw and evaluated the patient.  I personally confirmed the key portions of the history and exam documented by Dr. Patel,Vishal and I reviewed pertinent patient test results.  The assessment, diagnosis, and plan were formulated together and I agree with the documentation in the resident's note.  

## 2015-04-08 ENCOUNTER — Ambulatory Visit (HOSPITAL_COMMUNITY): Admission: RE | Admit: 2015-04-08 | Payer: Self-pay | Source: Ambulatory Visit

## 2015-04-13 ENCOUNTER — Telehealth: Payer: Self-pay | Admitting: *Deleted

## 2015-04-14 ENCOUNTER — Encounter: Payer: Self-pay | Admitting: Internal Medicine

## 2015-04-14 ENCOUNTER — Ambulatory Visit (INDEPENDENT_AMBULATORY_CARE_PROVIDER_SITE_OTHER): Payer: Self-pay | Admitting: Internal Medicine

## 2015-04-14 VITALS — BP 132/105 | HR 87 | Temp 98.1°F | Ht 62.0 in | Wt 205.1 lb

## 2015-04-14 DIAGNOSIS — Z794 Long term (current) use of insulin: Secondary | ICD-10-CM

## 2015-04-14 DIAGNOSIS — IMO0002 Reserved for concepts with insufficient information to code with codable children: Secondary | ICD-10-CM

## 2015-04-14 DIAGNOSIS — J069 Acute upper respiratory infection, unspecified: Secondary | ICD-10-CM | POA: Insufficient documentation

## 2015-04-14 DIAGNOSIS — E1165 Type 2 diabetes mellitus with hyperglycemia: Secondary | ICD-10-CM

## 2015-04-14 LAB — GLUCOSE, CAPILLARY: Glucose-Capillary: 293 mg/dL — ABNORMAL HIGH (ref 65–99)

## 2015-04-14 NOTE — Assessment & Plan Note (Signed)
CBG today is 293 slightly improved from 320s on last visit. She does admit to eating a meal right before coming to clinic. Patient has not yet contacted MAP program to receive Lantus as we discussed last visit, but she will as soon as her current symptoms improve. -Continue Lantus 15 units qhs and Novolog 5 units w/ meals -Patient advised to contact MAP or health department to see if able to receive insulin through them -Follow up next month

## 2015-04-14 NOTE — Progress Notes (Signed)
Patient ID: [name], female   DOB: 1961/06/25, 54 y.o.   MRN: [id number]   Subjective:   Patient ID: [name] female   DOB: 03/19/1961 54 y.o.   MRN: [id number]  HPI: [name] is a 54 y.o. with PMH as listed below who presents with complaint that she has cold-like symptoms.  Patient reports about 2 weeks history of runny nose, congestion, cough productive of yellow sputum, subjective fever, and slight headache which has been improving over the last 1 or 2 days. She has sick contacts in husband and grandchild who have similar symptoms. She, along with husband, have been using Mucinex and OTC cough medicine which have helped as well as trying to stay hydrated with fluids and home-made chicken noodle soup.  Past Medical History  Diagnosis Date  . Adhesive capsulitis of right shoulder     with underlying tendinopathy rotator cuff  . History of post-sterilization tuboplasty 2000  . Tear of meniscus of left knee     x2  . Tear of meniscus of right knee   . Plantar fasciitis     Right  . Shortness of breath     with exertion  . Diabetes mellitus     oral tx  . GERD (gastroesophageal reflux disease)   . Arthritis   . Sleep apnea 5 plus yrs    study -pt could not sleep test inconclusive.    Current Outpatient Prescriptions  Medication Sig Dispense Refill  . amitriptyline (ELAVIL) 25 MG tablet Take 2 tablets (50 mg total) by mouth at bedtime. 60 tablet 2  . cyclobenzaprine (FLEXERIL) 10 MG tablet Take 1 tablet (10 mg total) by mouth 2 (two) times daily. 30 tablet 1  . glucose blood (AGAMATRIX PRESTO TEST) test strip Use as instructed 100 each 12  . HYDROcodone-acetaminophen (NORCO) 10-325 MG per tablet Take 1 tablet by mouth 3 (three) times daily as needed for moderate pain. Do not fill until 30 days after last prescription was filled. 90 tablet 0  . hydrOXYzine (ATARAX/VISTARIL) 10 MG tablet Take 1 tablet (10 mg total) by mouth 3 (three) times daily as  needed. 30 tablet 0  . insulin aspart (NOVOLOG) 100 UNIT/ML injection Inject 5 Units into the skin 3 (three) times daily with meals. 10 mL 3  . insulin glargine (LANTUS) 100 UNIT/ML injection Inject 15 units at bedtime 10 mL 11  . Insulin Syringes, Disposable, U-100 1 ML MISC Use as directed. 500 each 0  . Liraglutide 18 MG/3ML SOPN Inject 0.3 mLs (1.8 mg total) into the skin daily. 6 mL 3  . meloxicam (MOBIC) 15 MG tablet Take 1 tablet (15 mg total) by mouth daily. 30 tablet 1  . metFORMIN (GLUCOPHAGE XR) 500 MG 24 hr tablet Take 1 tablet (500 mg total) by mouth daily with breakfast. 30 tablet 2  . sitaGLIPtin (JANUVIA) 100 MG tablet Take 1 tablet (100 mg total) by mouth daily. 30 tablet 11  . traMADol (ULTRAM) 50 MG tablet Take 2 tablets (100 mg total) by mouth every 8 (eight) hours as needed for moderate pain. Please do not refill until 30 days after last prescription was filled. 180 tablet 0  . triamcinolone cream (KENALOG) 0.1 % Apply to the affected area 2 to 4 times daily.  Do not apply to the face. 30 g 0   No current facility-administered medications for this visit.   Family History  Problem Relation Age of Onset  . Cancer Sister     both sisters have  breast cancer   Social History   Social History  . Marital Status: Married    Spouse Name: N/A  . Number of Children: N/A  . Years of Education: GED +1 yr   Occupational History  . unemployed Unemployed   Social History Main Topics  . Smoking status: Never Smoker   . Smokeless tobacco: Never Used  . Alcohol Use: No  . Drug Use: No  . Sexual Activity: Not Asked   Other Topics Concern  . None   Social History Narrative   Married, unemployed, Husband disabled and paraplegic 2/2 fall from tree stand while deer hunting, Son quadriplegic 2/2 MVA 05/2007 in [place]: Review of Systems  Constitutional: Positive for fever. Negative for chills.  HENT: Positive for congestion.   Respiratory: Positive for  cough and sputum production. Negative for hemoptysis, shortness of breath and wheezing.   Cardiovascular: Negative for chest pain, palpitations and leg swelling.  Gastrointestinal: Negative for nausea, vomiting, abdominal pain, diarrhea and constipation.  Genitourinary: Negative for dysuria.  Musculoskeletal: Positive for joint pain. Negative for falls.  Neurological: Negative for dizziness.    Objective:  Physical Exam: Filed Vitals:   04/14/15 1328  BP: 132/105  Pulse: 87  Temp: 98.1 F (36.7 C)  TempSrc: Oral  Height: 5\' 2"  (1.575 m)  Weight: 205 lb 1.6 oz (93.033 kg)  SpO2: 99%   Physical Exam  Constitutional: She is oriented to person, place, and time. She appears well-developed and well-nourished. No distress.  HENT:  Head: Normocephalic and atraumatic.  Mouth/Throat: Oropharynx is clear and moist. No oropharyngeal exudate.  Cardiovascular: Normal rate and regular rhythm.   Pulmonary/Chest: Effort normal and breath sounds normal. No respiratory distress. She has no wheezes. She has no rales.  Abdominal: Soft. There is no tenderness.  Musculoskeletal: She exhibits no edema.  Neurological: She is alert and oriented to person, place, and time.  Skin: Skin is warm.  Psychiatric: She has a normal mood and affect.    Assessment & Plan:  Please see problem based charting for assessment and plan.

## 2015-04-14 NOTE — Assessment & Plan Note (Signed)
Patient reports about 2 weeks history of runny nose, congestion, cough productive of yellow sputum, subjective fever, and slight headache which has been improving over the last 1 or 2 days. She has sick contacts in husband and grandchild who have similar symptoms. She, along with husband, have been using Mucinex and OTC cough medicine which have helped as well as trying to stay hydrated with fluids and home-made chicken noodle soup.  On exam, oral mucosa is moist without exudate or erythema, lungs CTA, and patient is afebrile. This is most likely a viral URI, which patient is already improving from, requiring further supportive care. -Continue OTC mucinex and cough medicine as needed -Continue fluids and soup -Patient advised to call us if symptoms worsen or do not improve over the next week

## 2015-04-14 NOTE — Patient Instructions (Signed)
Please continue supportive therapy for your cold with plenty of fluids and over the counter mucinex and cough medicine.  Please try to get in touch with the MAPs program as soon as possible for your diabetes medications.

## 2015-04-14 NOTE — Progress Notes (Signed)
Medicine attending: I personally interviewed and briefly examined this patient, and reviewed pertinent clinical laboratory  data  with resident physician Dr.Vishal Patel and we discussed a  management plan. 

## 2015-04-15 MED ORDER — DICLOFENAC SODIUM 75 MG PO TBEC: 75.0000 mg | DELAYED_RELEASE_TABLET | Freq: Two times a day (BID) | ORAL | Status: DC

## 2015-04-15 NOTE — Telephone Encounter (Signed)
[place] I called in some diclofenac. Tell her NOT to take meloxicam or any otc advil or ibuprofen with it. THANKS! Dorcas Mcmurray

## 2015-04-18 ENCOUNTER — Other Ambulatory Visit: Payer: Self-pay | Admitting: *Deleted

## 2015-04-18 MED ORDER — DICLOFENAC SODIUM 75 MG PO TBEC: 75.0000 mg | DELAYED_RELEASE_TABLET | Freq: Two times a day (BID) | ORAL | Status: DC

## 2015-05-06 ENCOUNTER — Ambulatory Visit (INDEPENDENT_AMBULATORY_CARE_PROVIDER_SITE_OTHER): Payer: Self-pay | Admitting: Family Medicine

## 2015-05-06 ENCOUNTER — Encounter: Payer: Self-pay | Admitting: Family Medicine

## 2015-05-06 VITALS — BP 129/80 | Ht 62.0 in | Wt 205.0 lb

## 2015-05-06 DIAGNOSIS — M549 Dorsalgia, unspecified: Secondary | ICD-10-CM

## 2015-05-06 DIAGNOSIS — G8929 Other chronic pain: Secondary | ICD-10-CM

## 2015-05-06 NOTE — Progress Notes (Signed)
   Subjective:    Patient ID: [name], female    DOB: Dec 12, 1960, 54 y.o.   MRN: [id number]  HPI Follow-up back pain. At last office visit she was having some musculoskeletal back pain. In the last 2 weeks she's had new symptom of radicular pain down the right leg posterior thigh, posterior calf and the sole of the foot. Worse with driving or extended periods of sitting. No numbness. No leg weakness. Nothing makes better except lying down.  PERTINENT  PMH / PSH: I have reviewed the patient's medications, allergies, past medical and surgical history. Pertinent findings that relate to today's visit / issues include: Diabetes mellitus Review of Systems Has had no fever, sweats, chills. No bowel or bladder incontinence.    Objective:   Physical Exam Vital signs are reviewed GEN.: Well-developed female no acute distress BACK: No deformity. No tenderness to palpation in the lumbar muscle area. No tenderness across the PSIS on either side. Mild tender to palpation in the area of the performance on the right, not on the left. Neuro: DTRs 2+ bilaterally equal at knee and ankle. Straight leg raise negative bilaterally. Sensation soft touch intact distally. MSK: Lower extremity strength 5 out of 5 hip and knee flexion extension, dorsiflexion, plantarflexion bilaterally.       Assessment & Plan:

## 2015-05-06 NOTE — Assessment & Plan Note (Addendum)
History of chronic back pain but now is having radiation down into the right leg in the sole of the right foot, particularly with sitting. DDX includes pirioformis syndrome, HNP, fibromyalgia. SI joint dysfunction.Marland Kitchen   Her PCP has already ordered an MRI. She had to cancel once secondary to upper respiratory illness. When she gets MRI done, she'll let me know so I can look at the images.   I suspect she has piriformis syndrome rather than herniated disc. I instructed her on getting a doughnut cushion for offloading, adding hip extension exercises 4 times a day 10 reps at a time. We briefly discussed other medications. She had used some Vicodin and thought that was beneficial but I don't think that will be good plan for her long-term. Potentially we could think about gabapentin if she continues to have symptoms or they become worse.If she will be compliant with conservative measures listed above I think she will get some improvement.

## 2015-05-09 ENCOUNTER — Ambulatory Visit (HOSPITAL_COMMUNITY): Admission: RE | Admit: 2015-05-09 | Payer: Self-pay | Source: Ambulatory Visit

## 2015-05-11 ENCOUNTER — Encounter: Payer: Self-pay | Admitting: Internal Medicine

## 2015-05-19 ENCOUNTER — Telehealth: Payer: Self-pay | Admitting: *Deleted

## 2015-05-19 MED ORDER — CYCLOBENZAPRINE HCL 10 MG PO TABS: 10.0000 mg | ORAL_TABLET | Freq: Two times a day (BID) | ORAL | Status: DC

## 2015-05-19 NOTE — Telephone Encounter (Signed)
EC talking about the lidocaine pain patch? If she is I will be happy to call that in but I don't know how expensive it is. If that's what she wants to think followed in with 2 refills. THANKS! Dorcas Mcmurray

## 2015-05-19 NOTE — Telephone Encounter (Signed)
Called in Flexeril 10mg  BID #[id number]

## 2015-05-24 ENCOUNTER — Ambulatory Visit (HOSPITAL_COMMUNITY): Admission: RE | Admit: 2015-05-24 | Payer: Self-pay | Source: Ambulatory Visit

## 2015-05-31 ENCOUNTER — Encounter: Payer: Self-pay | Admitting: Student

## 2015-06-06 ENCOUNTER — Ambulatory Visit (HOSPITAL_COMMUNITY): Admission: RE | Admit: 2015-06-06 | Payer: Self-pay | Source: Ambulatory Visit

## 2015-06-08 ENCOUNTER — Encounter: Payer: Self-pay | Admitting: Internal Medicine

## 2015-07-06 ENCOUNTER — Encounter: Payer: Self-pay | Admitting: Internal Medicine

## 2015-07-06 ENCOUNTER — Ambulatory Visit (INDEPENDENT_AMBULATORY_CARE_PROVIDER_SITE_OTHER): Payer: Self-pay | Admitting: Internal Medicine

## 2015-07-06 VITALS — BP 126/72 | HR 76 | Temp 98.2°F | Ht 62.0 in | Wt 201.8 lb

## 2015-07-06 DIAGNOSIS — Z794 Long term (current) use of insulin: Secondary | ICD-10-CM

## 2015-07-06 DIAGNOSIS — E1165 Type 2 diabetes mellitus with hyperglycemia: Secondary | ICD-10-CM

## 2015-07-06 DIAGNOSIS — E118 Type 2 diabetes mellitus with unspecified complications: Principal | ICD-10-CM

## 2015-07-06 DIAGNOSIS — E11649 Type 2 diabetes mellitus with hypoglycemia without coma: Secondary | ICD-10-CM

## 2015-07-06 DIAGNOSIS — M549 Dorsalgia, unspecified: Secondary | ICD-10-CM

## 2015-07-06 DIAGNOSIS — Z7984 Long term (current) use of oral hypoglycemic drugs: Secondary | ICD-10-CM

## 2015-07-06 DIAGNOSIS — G8929 Other chronic pain: Secondary | ICD-10-CM

## 2015-07-06 DIAGNOSIS — IMO0002 Reserved for concepts with insufficient information to code with codable children: Secondary | ICD-10-CM

## 2015-07-06 LAB — GLUCOSE, CAPILLARY: Glucose-Capillary: 298 mg/dL — ABNORMAL HIGH (ref 65–99)

## 2015-07-06 LAB — POCT GLYCOSYLATED HEMOGLOBIN (HGB A1C): Hemoglobin A1C: 10.8

## 2015-07-06 MED ORDER — HYDROCODONE-ACETAMINOPHEN 10-325 MG PO TABS: 1.0000 | ORAL_TABLET | Freq: Three times a day (TID) | ORAL | Status: DC | PRN

## 2015-07-06 MED ORDER — GLUCOSE BLOOD VI STRP: ORAL_STRIP | Status: DC

## 2015-07-06 MED ORDER — TRAMADOL HCL 50 MG PO TABS: 100.0000 mg | ORAL_TABLET | Freq: Three times a day (TID) | ORAL | Status: DC | PRN

## 2015-07-06 NOTE — Patient Instructions (Signed)
Please take your insulin as follows:  Lantus 15 units at night.  Novolog 5 units three times a day before meals. Always eat after taking the Novolog.  I sent a refill for strips to [facility].  I refilled your Tramadol and Norco today for 1 month supply.  Please try to get in touch with the [facility] program.

## 2015-07-07 NOTE — Progress Notes (Signed)
Patient ID: [name], female   DOB: Oct 06, 1960, 54 y.o.   MRN: [id number]   Subjective:   Patient ID: [name] female   DOB: [birth year] 54 y.o.   MRN: [id number]  HPI: [name] is a 54 y.o. female with PMH as listed below who presents for management of her diabetes and medication refill. Please see problem list for status of patient's chronic medical issues.   Past Medical History  Diagnosis Date  . Adhesive capsulitis of right shoulder     with underlying tendinopathy rotator cuff  . History of post-sterilization tuboplasty 2000  . Tear of meniscus of left knee     x2  . Tear of meniscus of right knee   . Plantar fasciitis     Right  . Shortness of breath     with exertion  . Diabetes mellitus     oral tx  . GERD (gastroesophageal reflux disease)   . Arthritis   . Sleep apnea 5 plus yrs    study -pt could not sleep test inconclusive.    Current Outpatient Prescriptions  Medication Sig Dispense Refill  . amitriptyline (ELAVIL) 25 MG tablet Take 2 tablets (50 mg total) by mouth at bedtime. 60 tablet 2  . cyclobenzaprine (FLEXERIL) 10 MG tablet   1  . cyclobenzaprine (FLEXERIL) 10 MG tablet Take 1 tablet (10 mg total) by mouth 2 (two) times daily. 20 tablet 0  . diclofenac (VOLTAREN) 75 MG EC tablet Take 1 tablet (75 mg total) by mouth 2 (two) times daily. 30 tablet 0  . glucose blood (AGAMATRIX PRESTO TEST) test strip Use as instructed 100 each 12  . HYDROcodone-acetaminophen (NORCO) 10-325 MG tablet Take 1 tablet by mouth 3 (three) times daily as needed. 90 tablet 0  . hydrOXYzine (ATARAX/VISTARIL) 10 MG tablet Take 1 tablet (10 mg total) by mouth 3 (three) times daily as needed. 30 tablet 0  . insulin glargine (LANTUS) 100 UNIT/ML injection Inject 15 units at bedtime 10 mL 11  . Insulin Syringes, Disposable, U-100 1 ML MISC Use as directed. 500 each 0  . Liraglutide 18 MG/3ML SOPN Inject 0.3 mLs (1.8 mg total) into the skin daily. 6 mL 3  .  metFORMIN (GLUCOPHAGE XR) 500 MG 24 hr tablet Take 1 tablet (500 mg total) by mouth daily with breakfast. 30 tablet 2  . sitaGLIPtin (JANUVIA) 100 MG tablet Take 1 tablet (100 mg total) by mouth daily. 30 tablet 11  . traMADol (ULTRAM) 50 MG tablet Take 2 tablets (100 mg total) by mouth every 8 (eight) hours as needed. 180 tablet 0  . triamcinolone cream (KENALOG) 0.1 % Apply to the affected area 2 to 4 times daily.  Do not apply to the face. 30 g 0   No current facility-administered medications for this visit.   Family History  Problem Relation Age of Onset  . Cancer Sister     both sisters have breast cancer   Social History   Social History  . Marital Status: Married    Spouse Name: N/A  . Number of Children: N/A  . Years of Education: GED +1 yr   Occupational History  . unemployed Unemployed   Social History Main Topics  . Smoking status: Never Smoker   . Smokeless tobacco: Never Used  . Alcohol Use: No  . Drug Use: No  . Sexual Activity: Not Asked   Other Topics Concern  . None   Social History Narrative   Married, unemployed, Husband disabled and  paraplegic 2/2 fall from tree stand while deer hunting, Son quadriplegic 2/2 MVA 05/2007 in [place]: Review of Systems  Respiratory: Negative for cough and shortness of breath.   Cardiovascular: Negative for chest pain and palpitations.  Gastrointestinal: Negative for nausea, vomiting and abdominal pain.  Musculoskeletal: Positive for back pain and joint pain.  Neurological: Negative for loss of consciousness.    Objective:  Physical Exam: Filed Vitals:   07/06/15 1609  BP: 126/72  Pulse: 76  Temp: 98.2 F (36.8 C)  TempSrc: Oral  Height: 5\' 2"  (1.575 m)  Weight: 201 lb 12.8 oz (91.536 kg)  SpO2: 98%   Physical Exam  Constitutional: She is oriented to person, place, and time. She appears well-developed and well-nourished. No distress.  HENT:  Head: Normocephalic and atraumatic.    Cardiovascular: Normal rate, regular rhythm and intact distal pulses.   Pulses:      Dorsalis pedis pulses are 2+ on the right side, and 2+ on the left side.       Posterior tibial pulses are 2+ on the right side, and 2+ on the left side.  Pulmonary/Chest: Effort normal. No respiratory distress. She has no wheezes. She has no rales.  Musculoskeletal: She exhibits no edema or tenderness.  Neurological: She is alert and oriented to person, place, and time.  Psychiatric: She has a normal mood and affect.    Assessment & Plan:  Please see problem based charting for assessment and plan.

## 2015-07-07 NOTE — Assessment & Plan Note (Addendum)
Patient has been following with Sports Medicine for her back pain. An MRI was ordered, but she did not have it done. She is on chronic pain medications and requesting refills.  A/P: Patient says she will have MRI completed. Dr. Nori Riis suspects Piriformis syndrome and advised patient on conservative exercises, with re-evaluation after MRI. She says her pain is controlled on current medications. Will refill her Tramadol and Norco, hopefully can find better options to control/treat her pain in the future.  -Refill Norco 10-325 mg 1 tablet TID prn #90 per month with instructions not to be filled until 30 days after last dispensed. Patient given paper prescription. -Refill Tramadol 50 mg 2 tabs q8h prn #180 per month with instructions not to be filled until 30 days after last dispensed. Patient given paper prescription.

## 2015-07-07 NOTE — Assessment & Plan Note (Signed)
Patient on prescribed diabetic therapy of Metformin 500 mg 24 hr tablet daily, Lantus 15 units qhs and Novolog 5 units TIDAC. However, she has been taking both her Lantus AND Novolog together at night. She says she has been taking between 5-15 units Novolog at night depending on her blood sugar levels. She reports having low blood glucoses around 1-2 am where she feels sweaty and dizzy. She corrects this by drinking half a glass of orange juice. She checks her CBGs at night, but did not bring her glucometer today. She says she usually sees numbers in the 260s. She has not had hyperglycemic symptoms. She admits that for the last month she has been "bad," with poor diet and being lazy. Her A1C in July was 8.6. Today it is 10.8. She has had issues in the past obtaining insulin due to cost. The last 2 visits we have discussed trying to get in with the [place] ([facility]) through the department of public health in [facility] which may be able to help her with this, but she says she still has not worked with them. She says she will go in the next day. She admits that she has not been doing her best the last month to control her diabetes and seems more motivated now.  A/P: Her diabetes control has worsened. She has been taking her Novolog incorrectly. Fortunately, she has not had any severe hypoglycemic episodes. We discussed that she should be taking the Novolog 5 units three times a day before meals. She should not take without eating afterwards. She understands and agrees. Foot exam completed today, DP and PT pulses +2 bilaterally. -Continue Metformin 500 mg 24 hr daily -Continue Lantus 15 units qhs -Novolog 5 units TIDAC (stop taking at night) -refilled test strips to [facility] -f/u with MAP

## 2015-07-08 NOTE — Addendum Note (Signed)
Addended by: Oval Linsey D on: 07/08/2015 07:37 AM   Modules accepted: Level of Service

## 2015-07-08 NOTE — Progress Notes (Signed)
I saw and evaluated the patient.  I personally confirmed the key portions of Dr. Patel's history and exam and reviewed pertinent patient test results.  The assessment, diagnosis, and plan were formulated together and I agree with the documentation in the resident's note. 

## 2015-08-02 ENCOUNTER — Other Ambulatory Visit: Payer: Self-pay | Admitting: Internal Medicine

## 2015-08-02 DIAGNOSIS — G8929 Other chronic pain: Secondary | ICD-10-CM

## 2015-08-02 DIAGNOSIS — M549 Dorsalgia, unspecified: Principal | ICD-10-CM

## 2015-08-02 NOTE — Telephone Encounter (Signed)
Last refill at [facility] on 12/21 UDS 10/22/14 No future appointments scheduled

## 2015-08-02 NOTE — Telephone Encounter (Signed)
Pt requesting tramadol and vicodin to be filled.

## 2015-08-04 MED ORDER — HYDROCODONE-ACETAMINOPHEN 10-325 MG PO TABS: 1.0000 | ORAL_TABLET | Freq: Three times a day (TID) | ORAL | Status: DC | PRN

## 2015-08-04 MED ORDER — TRAMADOL HCL 50 MG PO TABS: 100.0000 mg | ORAL_TABLET | Freq: Three times a day (TID) | ORAL | Status: DC | PRN

## 2015-08-04 NOTE — Telephone Encounter (Signed)
Called tramadol to pharm 

## 2015-08-04 NOTE — Telephone Encounter (Signed)
Pt informed, called tramadol in

## 2015-08-09 MED FILL — traMADol HCL 50 MG TABS: 50 | 30 days supply | Qty: 180 | Fill #0

## 2015-08-15 ENCOUNTER — Telehealth: Payer: Self-pay | Admitting: Internal Medicine

## 2015-08-15 ENCOUNTER — Other Ambulatory Visit: Payer: Self-pay | Admitting: *Deleted

## 2015-08-15 ENCOUNTER — Other Ambulatory Visit: Payer: Self-pay | Admitting: Internal Medicine

## 2015-08-15 ENCOUNTER — Ambulatory Visit: Payer: Self-pay | Admitting: Internal Medicine

## 2015-08-15 DIAGNOSIS — M549 Dorsalgia, unspecified: Principal | ICD-10-CM

## 2015-08-15 DIAGNOSIS — G8929 Other chronic pain: Secondary | ICD-10-CM

## 2015-08-15 MED FILL — HYDROCODON-APAP 10-325: 10-325 | 30 days supply | Qty: 90 | Fill #0

## 2015-08-15 NOTE — Telephone Encounter (Signed)
Please call pt back.

## 2015-08-15 NOTE — Telephone Encounter (Signed)
Pt c/o itching over entire body.  She has had this before and was treated.  Will you refill for her or do you want her to be seen  She would like a months supply.  Onset of itching 4 days ago.  Benadryl helps some but does not last. She states she also gets something like Pepcid along with Hydroxyzine.

## 2015-08-18 ENCOUNTER — Telehealth: Payer: Self-pay | Admitting: Internal Medicine

## 2015-08-18 ENCOUNTER — Ambulatory Visit (INDEPENDENT_AMBULATORY_CARE_PROVIDER_SITE_OTHER): Payer: Self-pay | Admitting: Internal Medicine

## 2015-08-18 ENCOUNTER — Encounter: Payer: Self-pay | Admitting: Internal Medicine

## 2015-08-18 VITALS — BP 115/71 | HR 72 | Temp 98.2°F | Ht 62.0 in | Wt 206.7 lb

## 2015-08-18 DIAGNOSIS — E1165 Type 2 diabetes mellitus with hyperglycemia: Secondary | ICD-10-CM

## 2015-08-18 DIAGNOSIS — G8929 Other chronic pain: Secondary | ICD-10-CM

## 2015-08-18 DIAGNOSIS — Z794 Long term (current) use of insulin: Secondary | ICD-10-CM

## 2015-08-18 DIAGNOSIS — L299 Pruritus, unspecified: Secondary | ICD-10-CM

## 2015-08-18 DIAGNOSIS — E118 Type 2 diabetes mellitus with unspecified complications: Secondary | ICD-10-CM

## 2015-08-18 DIAGNOSIS — M549 Dorsalgia, unspecified: Secondary | ICD-10-CM

## 2015-08-18 DIAGNOSIS — IMO0002 Reserved for concepts with insufficient information to code with codable children: Secondary | ICD-10-CM

## 2015-08-18 LAB — GLUCOSE, CAPILLARY: Glucose-Capillary: 187 mg/dL — ABNORMAL HIGH (ref 65–99)

## 2015-08-18 MED ORDER — CYCLOBENZAPRINE HCL 10 MG PO TABS: 10.0000 mg | ORAL_TABLET | Freq: Two times a day (BID) | ORAL | Status: DC

## 2015-08-18 MED ORDER — HYDROXYZINE HCL 10 MG PO TABS: 10.0000 mg | ORAL_TABLET | Freq: Three times a day (TID) | ORAL | Status: DC | PRN

## 2015-08-18 MED ORDER — LORATADINE 10 MG PO TABS: 10.0000 mg | ORAL_TABLET | Freq: Every day | ORAL | Status: DC

## 2015-08-18 NOTE — Patient Instructions (Signed)
1. Please make a follow up with your PCP in 4-6 weeks.   2. Please take all medications as previously prescribed with the following changes:  Start taking Claritin 10 mg daily, every day.   Take Atarax 1-2 times daily as needed for itching. THIS WILL MAKE YOU DROWSY.  We will try to get you in to see the allergist.   3. If you have worsening of your symptoms or new symptoms arise, please call the clinic [phone]), or go to the ER immediately if symptoms are severe.  You have done a great job in taking all your medications. Please continue to do this.    Hives Hives are itchy, red, swollen areas of the skin. They can vary in size and location on your body. Hives can come and go for hours or several days (acute hives) or for several weeks (chronic hives). Hives do not spread from person to person (noncontagious). They may get worse with scratching, exercise, and emotional stress. CAUSES   Allergic reaction to food, additives, or drugs.  Infections, including the common cold.  Illness, such as vasculitis, lupus, or thyroid disease.  Exposure to sunlight, heat, or cold.  Exercise.  Stress.  Contact with chemicals. SYMPTOMS   Red or white swollen patches on the skin. The patches may change size, shape, and location quickly and repeatedly.  Itching.  Swelling of the hands, feet, and face. This may occur if hives develop deeper in the skin. DIAGNOSIS  Your caregiver can usually tell what is wrong by performing a physical exam. Skin or blood tests may also be done to determine the cause of your hives. In some cases, the cause cannot be determined. TREATMENT  Mild cases usually get better with medicines such as antihistamines. Severe cases may require an emergency epinephrine injection. If the cause of your hives is known, treatment includes avoiding that trigger.  HOME CARE INSTRUCTIONS   Avoid causes that trigger your hives.  Take antihistamines as directed by your caregiver  to reduce the severity of your hives. Non-sedating or low-sedating antihistamines are usually recommended. Do not drive while taking an antihistamine.  Take any other medicines prescribed for itching as directed by your caregiver.  Wear loose-fitting clothing.  Keep all follow-up appointments as directed by your caregiver. SEEK MEDICAL CARE IF:   You have persistent or severe itching that is not relieved with medicine.  You have painful or swollen joints. SEEK IMMEDIATE MEDICAL CARE IF:   You have a fever.  Your tongue or lips are swollen.  You have trouble breathing or swallowing.  You feel tightness in the throat or chest.  You have abdominal pain. These problems may be the first sign of a life-threatening allergic reaction. Call your local emergency services (911 in [place] MAKE SURE YOU:   Understand these instructions.  Will watch your condition.  Will get help right away if you are not doing well or get worse.   This information is not intended to replace advice given to you by your health care provider. Make sure you discuss any questions you have with your health care provider.   Document Released: 07/02/2005 Document Revised: 07/07/2013 Document Reviewed: 09/25/2011 Elsevier Interactive Patient Education [employer].

## 2015-08-18 NOTE — Telephone Encounter (Signed)
Pharmacy calling about some rx that were faxed over wants to change Claritin10 mg to zyrtec 10 mg  And Atarax 10 mg do not have only have 25mg  or 50 mg

## 2015-08-18 NOTE — Progress Notes (Signed)
Subjective:   Patient ID: Krystalee Shibuya female   DOB: December 18, 1960 55 y.o.   MRN: [id number]  HPI: Ms. Delisha Patil is a 55 y.o. female w/ PMHx of GERD, DM type II, sleep apnea, and OA, presents to the clinic today for an acute visit for diffuse body itching. Patient says she has had this several times in the past, recently accompanied by a rash that frequently starts on her arms, chest, and back. She denies any new lotions, detergents, soaps, food allergies, h/o hay fever, or respiratory distress. She claims the rash is red, bumpy, and spreads rapidly when she itches. She cannot pinpoint an association with anything in particular. She states the rash is not discoid in nature, and is never on her face. She has not had any recent insect bites and no one else has this issue in her household.   Past Medical History  Diagnosis Date  . Adhesive capsulitis of right shoulder     with underlying tendinopathy rotator cuff  . History of post-sterilization tuboplasty 2000  . Tear of meniscus of left knee     x2  . Tear of meniscus of right knee   . Plantar fasciitis     Right  . Shortness of breath     with exertion  . Diabetes mellitus     oral tx  . GERD (gastroesophageal reflux disease)   . Arthritis   . Sleep apnea 5 plus yrs    study -pt could not sleep test inconclusive.    Current Outpatient Prescriptions  Medication Sig Dispense Refill  . amitriptyline (ELAVIL) 25 MG tablet Take 2 tablets (50 mg total) by mouth at bedtime. 60 tablet 2  . cyclobenzaprine (FLEXERIL) 10 MG tablet   1  . cyclobenzaprine (FLEXERIL) 10 MG tablet Take 1 tablet (10 mg total) by mouth 2 (two) times daily. 20 tablet 0  . diclofenac (VOLTAREN) 75 MG EC tablet Take 1 tablet (75 mg total) by mouth 2 (two) times daily. 30 tablet 0  . glucose blood (AGAMATRIX PRESTO TEST) test strip Use as instructed 100 each 12  . HYDROcodone-acetaminophen (NORCO) 10-325 MG tablet Take 1 tablet by mouth 3 (three) times  daily as needed. 90 tablet 0  . hydrOXYzine (ATARAX/VISTARIL) 10 MG tablet Take 1 tablet (10 mg total) by mouth 3 (three) times daily as needed. 30 tablet 0  . insulin glargine (LANTUS) 100 UNIT/ML injection Inject 15 units at bedtime 10 mL 11  . Insulin Syringes, Disposable, U-100 1 ML MISC Use as directed. 500 each 0  . Liraglutide 18 MG/3ML SOPN Inject 0.3 mLs (1.8 mg total) into the skin daily. 6 mL 3  . metFORMIN (GLUCOPHAGE XR) 500 MG 24 hr tablet Take 1 tablet (500 mg total) by mouth daily with breakfast. 30 tablet 2  . sitaGLIPtin (JANUVIA) 100 MG tablet Take 1 tablet (100 mg total) by mouth daily. 30 tablet 11  . traMADol (ULTRAM) 50 MG tablet Take 2 tablets (100 mg total) by mouth every 8 (eight) hours as needed. 180 tablet 0  . triamcinolone cream (KENALOG) 0.1 % Apply to the affected area 2 to 4 times daily.  Do not apply to the face. 30 g 0   No current facility-administered medications for this visit.    Review of Systems: General: Positive for generalized itching. Denies fever, chills, diaphoresis, appetite change and fatigue.  Respiratory: Denies SOB, DOE, cough, and wheezing.   Cardiovascular: Denies chest pain and palpitations.  Gastrointestinal: Denies nausea, vomiting, abdominal  pain, and diarrhea.  Genitourinary: Denies dysuria, increased frequency, and flank pain. Endocrine: Denies hot or cold intolerance, polyuria, and polydipsia. Musculoskeletal: Denies myalgias, back pain, joint swelling, arthralgias and gait problem.  Skin: Positive for rash. Denies pallor and wounds.  Neurological: Denies dizziness, seizures, syncope, weakness, lightheadedness, numbness and headaches.  Psychiatric/Behavioral: Denies mood changes, and sleep disturbances.  Objective:   Physical Exam: Filed Vitals:   08/18/15 1351  BP: 115/71  Pulse: 72  Temp: 98.2 F (36.8 C)  TempSrc: Oral  Height: 5\' 2"  (1.575 m)  Weight: 206 lb 11.2 oz (93.759 kg)  SpO2: 100%    General: Alert,  cooperative, NAD. HEENT: PERRL, EOMI. Moist mucus membranes Neck: Full range of motion without pain, supple, no lymphadenopathy or carotid bruits Lungs: Clear to ascultation bilaterally, normal work of respiration, no wheezes, rales, rhonchi Heart: RRR, no murmurs, gallops, or rubs Abdomen: Soft, non-tender, non-distended, BS + Extremities: No cyanosis, clubbing, or edema. Scattered excoriations on forearms, wrists.  Neurologic: Alert & oriented x3, cranial nerves II-XII intact, strength grossly intact, sensation intact to light touch  No rash seen on exam.    Assessment & Plan:   Please see problem based assessment and plan.

## 2015-08-18 NOTE — Telephone Encounter (Signed)
appt for today at 1345 dr Ronnald Ramp

## 2015-08-19 MED ORDER — HYDROXYZINE HCL 25 MG PO TABS: 25.0000 mg | ORAL_TABLET | Freq: Every evening | ORAL | Status: DC | PRN

## 2015-08-19 MED ORDER — CETIRIZINE HCL 10 MG PO TABS: 10.0000 mg | ORAL_TABLET | Freq: Every day | ORAL | Status: DC

## 2015-08-19 NOTE — Progress Notes (Signed)
Medicine attending: Medical history, presenting problems, physical findings, and medications, reviewed with resident physician Dr Eden Jones on the day of the patient visit and I concur with his evaluation and management plan. 

## 2015-08-19 NOTE — Assessment & Plan Note (Signed)
Patient with pruritus on her arms, chest and back, claims it is also associated with a rash. No recent changes in soaps, detergents, or lotions. Has had this issue 6-7 times previously as well. Says she will start to itch a spot and then a large red area will propagate. Do not suspect contact dermatitis. No malar or discoid rash, do not suspect larger diagnosis such as SLE. Patient does not have a significant history of allergies to foods, hay fever, etc, however this could be related to an unknown allergy as she has had this issue several times in the past. Also could be urticaria given the non-specific locations, no clear cause, and propagation with itching. She claims the rash is also raised, but does not think it looks like "hives".  -Give Atarax 25 mg qhs prn for itching + Zyrtec 10 mg to take daily.  -Referral to allergy for skin testing given multiple occurences

## 2015-08-19 NOTE — Assessment & Plan Note (Signed)
Admits to recent back spasms. Follows with Sports Medicine. Scheduled for imaging to evaluate for sciatica.  -Given Rx refill for flexeril

## 2015-08-22 ENCOUNTER — Other Ambulatory Visit: Payer: Self-pay | Admitting: Internal Medicine

## 2015-08-22 NOTE — Telephone Encounter (Signed)
Pt requesting Januvia to be filled @ Outpatient pharmacy.

## 2015-08-24 ENCOUNTER — Telehealth: Payer: Self-pay | Admitting: Internal Medicine

## 2015-08-24 NOTE — Telephone Encounter (Signed)
Pt requesting the nurse to call back regarding hydroxyzine and cetirizine.

## 2015-08-26 ENCOUNTER — Encounter: Payer: Self-pay | Admitting: Family Medicine

## 2015-08-26 ENCOUNTER — Ambulatory Visit (HOSPITAL_COMMUNITY): Admission: RE | Admit: 2015-08-26 | Payer: Self-pay | Source: Ambulatory Visit

## 2015-08-26 ENCOUNTER — Ambulatory Visit (INDEPENDENT_AMBULATORY_CARE_PROVIDER_SITE_OTHER): Payer: Self-pay | Admitting: Family Medicine

## 2015-08-26 VITALS — BP 141/80 | HR 85 | Ht 62.0 in | Wt 206.0 lb

## 2015-08-26 DIAGNOSIS — M722 Plantar fascial fibromatosis: Secondary | ICD-10-CM

## 2015-08-26 DIAGNOSIS — M25511 Pain in right shoulder: Secondary | ICD-10-CM

## 2015-08-26 MED ORDER — DICLOFENAC SODIUM 75 MG PO TBEC: 75.0000 mg | DELAYED_RELEASE_TABLET | Freq: Two times a day (BID) | ORAL | Status: DC | PRN

## 2015-08-26 NOTE — Progress Notes (Signed)
   Subjective:    Patient ID: [name], female    DOB: 08/10/1960, 55 y.o.   MRN: [id number]  HPI Chief complaint: Bilateral foot pain She had plantar fasciectomy of the left foot and is significantly improved except for some residual numbness. She would like to get fitted for custom molded orthotics to see if she can improve the pain in her right foot and keep her left foot from further degenerating.  Also having some right shoulder pain. She picked up a fairly light weight box lifted over her head the other day and felt a little bit of a pop. It has been aching since. In the past she's used diclofenac occasionally would like to try that again.  PERTINENT  PMH / PSH: I have reviewed the patient's medications, allergies, past medical and surgical history. 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 They discussed options and ultimately she decided just to follow this for now.   Review of Systems Blood sugars have been up. No increased frequency of urination, no fever. She had gained some weight.    Objective:   Physical Exam  Vital signs reviewed GEN.: Well-developed obese female no acute distress FEET: Bilaterally significant pes planus. SHOULDER: Bilaterally shoulders have full range of motion with intact strength in all planes the rotator cuff. She has some mild pain with supraspinatus testing on the right.  Patient was fitted for a : standard, cushioned, semi-rigid orthotic. The orthotic was heated, placed on the orthotic stand. The patient was positioned in subtalar neutral position and 10 degrees of ankle dorsiflexion in a weight bearing stance on the heated orthotic blank After completion of molding, a stable base was applied to the orthotic blank. The blank was ground to a  stable position for weight bearing. Blank: Red EVA Base: F3 white Posting: None  Face to face time spent in evaluation, measurement and manufacture of custom molded orthotic was 30 minutes.       Assessment & Plan:

## 2015-08-29 MED ORDER — SITAGLIPTIN PHOSPHATE 100 MG PO TABS: 100.0000 mg | ORAL_TABLET | Freq: Every day | ORAL | Status: DC

## 2015-09-02 ENCOUNTER — Ambulatory Visit (INDEPENDENT_AMBULATORY_CARE_PROVIDER_SITE_OTHER): Payer: Self-pay | Admitting: Family Medicine

## 2015-09-02 ENCOUNTER — Encounter: Payer: Self-pay | Admitting: Family Medicine

## 2015-09-02 VITALS — BP 133/83 | HR 70 | Ht 62.0 in | Wt 206.0 lb

## 2015-09-02 DIAGNOSIS — M25512 Pain in left shoulder: Secondary | ICD-10-CM

## 2015-09-02 MED ORDER — METHYLPREDNISOLONE ACETATE 40 MG/ML IJ SUSP
40.0000 mg | Freq: Once | INTRAMUSCULAR | Status: AC
  Administered 2015-09-02: 40 mg via INTRA_ARTICULAR

## 2015-09-02 NOTE — Progress Notes (Signed)
   Subjective:    Patient ID: [name], female    DOB: 07-07-1961, 55 y.o.   MRN: [id number]  HPI    Review of Systems     Objective:   Physical Exam  Vital signs are reviewed GEN.: Well-developed overweight female no acute distress SHOULDERS: Bilaterally symmetrical. Left shoulder has full range of motion but abduction or forward flexion above 90 is painful, positive impingement signs on the left. Rotator cuff strength is intact in all planes the rotator cuff. Neuro: Intact Sensation Soft Touch Bilateral Hands VASCULAR: Radial pulses 2+ bilaterally equal INJECTION: Patient was given informed consent, signed copy in the chart. Appropriate time out was taken. Area prepped and draped in usual sterile fashion. 1 cc of methylprednisolone 40 mg/ml plus  4 cc of 1% lidocaine without epinephrine was injected into the left subacromial bursa using a(n) posterior approach. The patient tolerated the procedure well. There were no complications. Post procedure instructions were given.     Assessment & Plan:  She has previously had problems with adhesive capsulitis on the right. Left shoulder pain today is consistent with subacromial bursitis with some positive impingement signs. We elected to the corticosteroid injection today and have her do some home exercise program. If She's not improving in the next few weeks, return to clinic.

## 2015-09-12 ENCOUNTER — Other Ambulatory Visit: Payer: Self-pay | Admitting: Internal Medicine

## 2015-09-12 DIAGNOSIS — M549 Dorsalgia, unspecified: Principal | ICD-10-CM

## 2015-09-12 DIAGNOSIS — G8929 Other chronic pain: Secondary | ICD-10-CM

## 2015-09-12 NOTE — Telephone Encounter (Signed)
Last filled 1/19 Last visit 12/21 Last uds 10/2014

## 2015-09-12 NOTE — Telephone Encounter (Signed)
Calling requesting both pain Medications be refilled

## 2015-09-13 ENCOUNTER — Other Ambulatory Visit: Payer: Self-pay | Admitting: Internal Medicine

## 2015-09-13 NOTE — Telephone Encounter (Signed)
Pt requesting paper Rx for Januvia. Please call pt back.

## 2015-09-13 NOTE — Telephone Encounter (Signed)
Pt wants paper script

## 2015-09-14 MED ORDER — SITAGLIPTIN PHOSPHATE 100 MG PO TABS: 100.0000 mg | ORAL_TABLET | Freq: Every day | ORAL | Status: DC

## 2015-09-14 MED ORDER — TRAMADOL HCL 50 MG PO TABS: 100.0000 mg | ORAL_TABLET | Freq: Three times a day (TID) | ORAL | Status: DC | PRN

## 2015-09-14 MED ORDER — HYDROCODONE-ACETAMINOPHEN 10-325 MG PO TABS: 1.0000 | ORAL_TABLET | Freq: Three times a day (TID) | ORAL | Status: DC | PRN

## 2015-09-14 MED FILL — traMADol HCL 50 MG TABS: 50 | 30 days supply | Qty: 180 | Fill #0

## 2015-09-14 MED FILL — HYDROCODON-APAP 10-325: 10-325 | 30 days supply | Qty: 90 | Fill #0

## 2015-09-14 NOTE — Telephone Encounter (Signed)
Pt informed ready 

## 2015-09-14 NOTE — Telephone Encounter (Signed)
Pt informed

## 2015-09-16 ENCOUNTER — Ambulatory Visit (INDEPENDENT_AMBULATORY_CARE_PROVIDER_SITE_OTHER): Payer: Self-pay | Admitting: Family Medicine

## 2015-09-16 VITALS — BP 138/71

## 2015-09-16 DIAGNOSIS — M25512 Pain in left shoulder: Secondary | ICD-10-CM

## 2015-09-16 DIAGNOSIS — M19019 Primary osteoarthritis, unspecified shoulder: Secondary | ICD-10-CM

## 2015-09-16 DIAGNOSIS — M129 Arthropathy, unspecified: Secondary | ICD-10-CM

## 2015-09-16 MED ORDER — METHYLPREDNISOLONE ACETATE 40 MG/ML IJ SUSP
40.0000 mg | Freq: Once | INTRAMUSCULAR | Status: AC
Start: 2015-09-16 — End: 2015-09-16
  Administered 2015-09-16: 40 mg via INTRA_ARTICULAR

## 2015-09-19 ENCOUNTER — Ambulatory Visit (HOSPITAL_COMMUNITY)
Admission: RE | Admit: 2015-09-19 | Discharge: 2015-09-19 | Disposition: A | Discharge status: Home / Self Care | Payer: Self-pay | Source: Ambulatory Visit | Attending: Internal Medicine | Admitting: Internal Medicine

## 2015-09-19 ENCOUNTER — Encounter (HOSPITAL_COMMUNITY): Payer: Self-pay | Admitting: Radiology

## 2015-09-19 DIAGNOSIS — M5136 Other intervertebral disc degeneration, lumbar region: Secondary | ICD-10-CM | POA: Insufficient documentation

## 2015-09-19 DIAGNOSIS — M549 Dorsalgia, unspecified: Secondary | ICD-10-CM

## 2015-09-19 DIAGNOSIS — M4806 Spinal stenosis, lumbar region: Secondary | ICD-10-CM | POA: Insufficient documentation

## 2015-09-19 DIAGNOSIS — M5126 Other intervertebral disc displacement, lumbar region: Secondary | ICD-10-CM | POA: Insufficient documentation

## 2015-09-19 DIAGNOSIS — D259 Leiomyoma of uterus, unspecified: Secondary | ICD-10-CM | POA: Insufficient documentation

## 2015-09-19 DIAGNOSIS — G8929 Other chronic pain: Secondary | ICD-10-CM | POA: Insufficient documentation

## 2015-09-19 DIAGNOSIS — M19019 Primary osteoarthritis, unspecified shoulder: Secondary | ICD-10-CM | POA: Insufficient documentation

## 2015-09-19 NOTE — Assessment & Plan Note (Signed)
Ultrasound-guided acromioclavicular joint injection today. She'll follow-up in 2 weeks if not improving. I recommend she restart the home exercise program for rotator cuff on the left side.

## 2015-09-19 NOTE — Progress Notes (Signed)
   Subjective:    Patient ID: [name], female    DOB: [birth year], 55 y.o.   MRN: [id number]  HPI Left shoulder pain At last office visit we gave her a subacromial injection which improved her shoulder pain by about 50%. She still has significant pain with reaching forward. Lateral motion is much better however. She notes the pain is now mostly in the anterior shoulder, sharp and aching in nature. Better with rest.  PERTINENT  PMH / PSH: I have reviewed the patient's medications, allergies, past medical and surgical history. Pertinent findings that relate to today's visit / issues include: Diabetes mellitus since the controlled History of Dupuytren's contracture History of plantar fibromatosis status post surgery Nonsmoker  Review of Systems No unusual fever, no unusual weight change. Her blood sugars have not been particularly well regulated but she's had no episodes of low blood sugar, no increased thirst or increased urination.    Objective:   Physical Exam  Vital signs are reviewed GEN.: Well-developed overweight female no acute distress SHOULDER: Left. Tender to palpation over the acromioclavicular joint. The bicep tendon is nontender to palpation. Negative speed's and negative yergason'stests. Crossover testing is painful. She has full strength in all planes the rotator cuff. She can elevate in abduction 230 in forward flexion 230. She has some mild pain in both planes in the last 20-30 of motion. Internal rotation, she can place her hand behind her left hip to L5.  ULTRASOUND: The acromioclavicular joint has some effusion around it with positive mushroom side. There some osteophytes in the joint space. The joint space is narrowed. There is no increased Doppler activity noted over the joint space.  INJECTION: Patient was given informed consent, signed copy in the chart. Appropriate time out was taken. Area prepped and draped in usual sterile fashion. Sterile ultrasound  gel and a sterile Tegaderm to cover the probe were used for ultrasound-guided injection. 1 cc of methylprednisolone 40 mg/ml plus  1 cc of 1% lidocaine without epinephrine was injected into the acromioclavicular joint space using a(n) ultrasound-guided approach. The patient tolerated the procedure well. There were no complications. Post procedure instructions were given.       Assessment & Plan:

## 2015-09-30 ENCOUNTER — Ambulatory Visit: Payer: Self-pay | Admitting: Family Medicine

## 2015-10-06 ENCOUNTER — Other Ambulatory Visit: Payer: Self-pay | Admitting: Internal Medicine

## 2015-10-06 DIAGNOSIS — M549 Dorsalgia, unspecified: Principal | ICD-10-CM

## 2015-10-06 DIAGNOSIS — G8929 Other chronic pain: Secondary | ICD-10-CM

## 2015-10-06 NOTE — Telephone Encounter (Signed)
NEEDS REFILL ON PAIN MEDICATION, °

## 2015-10-07 ENCOUNTER — Ambulatory Visit (INDEPENDENT_AMBULATORY_CARE_PROVIDER_SITE_OTHER): Payer: Self-pay | Admitting: Family Medicine

## 2015-10-07 ENCOUNTER — Encounter: Payer: Self-pay | Admitting: Family Medicine

## 2015-10-07 VITALS — BP 132/75 | HR 73 | Ht 62.0 in | Wt 206.0 lb

## 2015-10-07 DIAGNOSIS — G8929 Other chronic pain: Secondary | ICD-10-CM

## 2015-10-07 DIAGNOSIS — M549 Dorsalgia, unspecified: Secondary | ICD-10-CM

## 2015-10-07 MED ORDER — CYCLOBENZAPRINE HCL 10 MG PO TABS: 10.0000 mg | ORAL_TABLET | Freq: Two times a day (BID) | ORAL | Status: DC

## 2015-10-07 MED ORDER — HYDROCODONE-ACETAMINOPHEN 10-325 MG PO TABS: 1.0000 | ORAL_TABLET | Freq: Three times a day (TID) | ORAL | Status: DC | PRN

## 2015-10-07 MED ORDER — TRAMADOL HCL 50 MG PO TABS: 100.0000 mg | ORAL_TABLET | Freq: Three times a day (TID) | ORAL | Status: DC | PRN

## 2015-10-07 NOTE — Assessment & Plan Note (Signed)
I think the thing causing her the biggest issue is the chronic muscle spasm which is evidenced by her loss of lordosis. Since most of her intermittent radicular pain is on the opposite side from her disc bulge, I do not think this is contributing. Long discussion with her regarding need for core strengthening of the back. Hyperextension exercises and handout given. I will refill her muscle relaxers. I recommend she do the exercises at least daily if not twice daily.

## 2015-10-07 NOTE — Telephone Encounter (Signed)
Pt informed

## 2015-10-07 NOTE — Progress Notes (Signed)
   Subjective:    Patient ID: [name], female    DOB: [birth year], 55 y.o.   MRN: [id number]  HPI  Back pain. Mid and low back. Worse with standing area pain occasionally radiates down into the buttock and posterior leg alternating sides. Typically the right side has more radiating pain in the left, she never has pain in both posterior legs at the same time. Denies bowel or bladder incontinence. Pain is 4 out of 10 most days. Worse if she does a lot of activity leaning over.  Review of Systems No unusual weight change.    Objective:   Physical Exam  Vital signs are reviewed GEN.: Well-developed overweight female no acute distress BACK: No defect is noted. Tender to palpation in the paravertebral muscles bilaterally low thoracic and the lumbar spine. There is no tenderness to percussion of the vertebra. She can flex at the hips 90. She can hyperextend 5-10. Lateral rotation is limited 10:15 degrees on each side. NEURO: Straight leg raise negative bilaterally in seated and supine position. IMAGING: MRI LS-spine shows straightening of the normal lumbar lordosis. There is a disc protrusion to the left at L4-L5 causing mild left  foraminal stenosis. No significant canal stenosis.  I reviewed the report and images with her in detail.    Assessment & Plan:  Greater than 50% of our 25 minute office visit was spent in counseling and education regarding these issues about her back MRI results, what that means, treatment issues, exercise.

## 2015-10-20 MED FILL — traMADol HCL 50 MG TABS: 50 | 30 days supply | Qty: 180 | Fill #0

## 2015-10-20 MED FILL — HYDROCODON-APAP 10-325: 10-325 | 30 days supply | Qty: 90 | Fill #0

## 2015-11-11 ENCOUNTER — Ambulatory Visit (INDEPENDENT_AMBULATORY_CARE_PROVIDER_SITE_OTHER): Payer: Self-pay | Admitting: Family Medicine

## 2015-11-11 ENCOUNTER — Encounter: Payer: Self-pay | Admitting: Family Medicine

## 2015-11-11 VITALS — BP 115/61 | HR 74 | Ht 62.0 in | Wt 206.0 lb

## 2015-11-11 DIAGNOSIS — M19019 Primary osteoarthritis, unspecified shoulder: Secondary | ICD-10-CM

## 2015-11-11 DIAGNOSIS — M129 Arthropathy, unspecified: Secondary | ICD-10-CM

## 2015-11-11 DIAGNOSIS — M25512 Pain in left shoulder: Secondary | ICD-10-CM

## 2015-11-11 NOTE — Progress Notes (Signed)
   Subjective:    Patient ID: [name], female    DOB: 08/14/1960, 55 y.o.   MRN: [id number]  HPI Shoulder pain We have done glenohumeral joint injection, subacromial bursa injection and acromioclavicular joint injection all which helped briefly. The last injection only lasted for about 2 weeks. She is very frustrated. Has pain particularly with trying to pick anything up on the left side such as when she tries to pick up her grandson. Pain is aching in nature. Cannot sleep on that side at night. Radiates to the upper arm. Pain 7-9 out of 10. At this point she is ready consider any other option.   Review of Systems No fever, sweats, chills. No unusual weight change. No numbness or tingling in her left upper extremity.    Objective:   Physical Exam  Vital signs are reviewed GEN.: Well-developed female no acute distress and SHOULDERS: Bilaterally symmetrical. Positive impingement signs on the left. She has full strength in all planes the rotator cuff area she has pain with supraspinatus testing. The acromioclavicular joint is tender to palpation. Internal rotation is limited by pain.      Assessment & Plan:

## 2015-11-11 NOTE — Assessment & Plan Note (Signed)
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 Injection helped briefly. At this point I think we have almost exhausted the conservative treatment. Like to get some updated films because a plan to send her to orthopedic surgery for further evaluation and management.

## 2015-11-17 ENCOUNTER — Other Ambulatory Visit: Payer: Self-pay | Admitting: Internal Medicine

## 2015-11-17 DIAGNOSIS — L299 Pruritus, unspecified: Secondary | ICD-10-CM

## 2015-11-17 DIAGNOSIS — M549 Dorsalgia, unspecified: Principal | ICD-10-CM

## 2015-11-17 DIAGNOSIS — G8929 Other chronic pain: Secondary | ICD-10-CM

## 2015-11-17 MED ORDER — HYDROXYZINE HCL 25 MG PO TABS: 25.0000 mg | ORAL_TABLET | Freq: Every evening | ORAL | Status: DC | PRN

## 2015-11-17 MED ORDER — TRAMADOL HCL 50 MG PO TABS
100.0000 mg | ORAL_TABLET | Freq: Three times a day (TID) | ORAL | Status: DC | PRN
Start: 2015-11-17 — End: 2015-12-19

## 2015-11-17 MED ORDER — HYDROCODONE-ACETAMINOPHEN 10-325 MG PO TABS: 1.0000 | ORAL_TABLET | Freq: Three times a day (TID) | ORAL | Status: DC | PRN

## 2015-11-17 NOTE — Telephone Encounter (Signed)
Pt requesting a Tramadol, Norco and hydroxyzine refill

## 2015-11-17 NOTE — Telephone Encounter (Signed)
Last written3/24 Next appt 6/14 Last uds 10/2014

## 2015-11-18 MED FILL — HYDROCODON-APAP 10-325: 10-325 | 30 days supply | Qty: 90 | Fill #0

## 2015-11-18 MED FILL — hydrOXYzine HCL 25 MG TABS: 25 | 30 days supply | Qty: 30 | Fill #0

## 2015-11-18 MED FILL — traMADol HCL 50 MG TABS: 50 | 30 days supply | Qty: 180 | Fill #0

## 2015-11-18 NOTE — Telephone Encounter (Signed)
Pt notified Tramadol called to pharm

## 2015-11-21 ENCOUNTER — Ambulatory Visit: Payer: Self-pay

## 2015-11-22 ENCOUNTER — Ambulatory Visit (INDEPENDENT_AMBULATORY_CARE_PROVIDER_SITE_OTHER): Payer: Self-pay | Admitting: Internal Medicine

## 2015-11-22 ENCOUNTER — Encounter: Payer: Self-pay | Admitting: Internal Medicine

## 2015-11-22 ENCOUNTER — Other Ambulatory Visit: Payer: Self-pay | Admitting: Internal Medicine

## 2015-11-22 VITALS — BP 131/78 | HR 78 | Temp 98.2°F | Ht 62.0 in | Wt 210.3 lb

## 2015-11-22 DIAGNOSIS — Z794 Long term (current) use of insulin: Secondary | ICD-10-CM

## 2015-11-22 DIAGNOSIS — E1165 Type 2 diabetes mellitus with hyperglycemia: Secondary | ICD-10-CM

## 2015-11-22 DIAGNOSIS — L299 Pruritus, unspecified: Secondary | ICD-10-CM

## 2015-11-22 DIAGNOSIS — IMO0002 Reserved for concepts with insufficient information to code with codable children: Secondary | ICD-10-CM

## 2015-11-22 DIAGNOSIS — E118 Type 2 diabetes mellitus with unspecified complications: Principal | ICD-10-CM

## 2015-11-22 DIAGNOSIS — Z9114 Patient's other noncompliance with medication regimen: Secondary | ICD-10-CM

## 2015-11-22 DIAGNOSIS — Z599 Problem related to housing and economic circumstances, unspecified: Secondary | ICD-10-CM

## 2015-11-22 LAB — POCT GLYCOSYLATED HEMOGLOBIN (HGB A1C): Hemoglobin A1C: 11

## 2015-11-22 LAB — GLUCOSE, CAPILLARY: Glucose-Capillary: 343 mg/dL — ABNORMAL HIGH (ref 65–99)

## 2015-11-22 NOTE — Addendum Note (Signed)
Addended by: Riccardo Dubin on: 11/22/2015 02:36 PM   Modules accepted: Orders

## 2015-11-22 NOTE — Progress Notes (Signed)
   Subjective:    Patient ID: [name], female    DOB: 27-Dec-1960, 55 y.o.   MRN: [id number]  HPI Ms. Riskin is a 55 year old female with hypertension, poorly controlled type 2 diabetes, hyperlipidemia, chronic back pain who presents today for itching. Please see assessment & plan for status of chronic medical problems.  She was previously referred to allergy though referral note 09/16/15 specifies she cannot get an appointment through the [facility].  Review of Systems  Constitutional: Negative for appetite change and unexpected weight change.  Gastrointestinal: Positive for constipation (Occasionally).  Musculoskeletal: Positive for back pain.  Skin: Negative for rash.       Objective:   Physical Exam  Constitutional: She is oriented to person, place, and time. She appears well-developed and well-nourished. No distress.  HENT:  Head: Normocephalic and atraumatic.  Eyes: Conjunctivae are normal. No scleral icterus.  Neck: No tracheal deviation present.  Cardiovascular: Normal rate and regular rhythm.  Exam reveals no gallop and no friction rub.   No murmur heard. Pulmonary/Chest: Effort normal. No stridor. No respiratory distress. She has no wheezes.  Neurological: She is alert and oriented to person, place, and time.  Skin: Skin is warm and dry. No rash noted. She is not diaphoretic.  Bilateral lower extremities notable for excoriations but no erythema or discoloration noted. Bilateral upper extremities also unremarkable for any rashes, lesions, erythema.          Assessment & Plan:

## 2015-11-22 NOTE — Assessment & Plan Note (Addendum)
Overview For the last 2 months, she has complained of itching of her bilateral lower extremities from the waist down. Prior to this, the itching was from her neck down to her waist. Though she cannot pinpoint a seasonal variation to her symptoms, she does feel heat seems to make it worse along with the night. She has tried hydroxyzine prescribed to her from our clinic as well as over-the-counter Benadryl without much relief beyond 30-60 minutes following each dose. [place], she denies any recent insect bites. She lives with her husband who also does not complain of similar symptoms. The itching has been associated with a nickel sized spot of erythema which was initially on her right medial thigh and then appeared on her left medial thigh but did not persist for beyond 2 weeks. She denies any constitutional symptoms like weight loss, night sweats, fevers, changes in bowel movements, changes in appetite, difficulty breathing. She also denies a history of illicit substance use along with tobacco or alcohol use. The last time she was out of the country was 5 years ago with travel to [place] to visit her father-in-law.  Assessment Pruritus of unknown etiology. It appears the symptoms have been persistent since at least September. The bilateral distribution of her symptoms makes me think of a systemic process at play. CMET in September 2016 was reassuring for no abnormalities in her renal function as well as liver function. She has not had a recent TSH though the last 2 have been reassuring. In the absence of an overt lesion, urticaria related to hepatitis is less likely though it has not been checked. It may be related to her chronic opiate therapy. Review of her CBCs is notable however for minimally elevated hemoglobin to the 15 and 16 which is odd in the absence of ongoing tobacco use though possible if she has undiagnosed obstructive sleep apnea.  Plan -Check HCV,  TSH, CBC with differential, ferritin and iron studies, HIV -Recommend she continue her current treatment as prescribing her any medication would have more harm than benefit since we do not know what is causing her symptoms -Follow-up in 1 week for reassessment  ADDENDUM 11/29/2015  5:26 PM:  HIV, iron studies all reassuring.  HCV, TSH reassuring.  CBC notable for mildly elevated absolute lymphocyte count 3.9 though no leukocytosis or elevated hemoglobin/hematocrit. Only other prior value is elevated at 4.3 though would make sense given leukocytosis at the time of 10.8.  Overall, her lab work is largely reassuring for no systemic causes of pruritus. Likely adverse effect of her chronic opiate therapy. We will also do Pap smear and refer for mammography just to be thorough which we'll discuss at her next visit.

## 2015-11-22 NOTE — Assessment & Plan Note (Signed)
Overview She reports me today that she has been out of Lantus and is due to her new her coverage within this month. She is due for an A1c rechecked.  Assessment Poorly controlled type 2 diabetes with nonadherence to insulin therapy secondary to cost.  Plan Recheck A1c today  ADDENDUM 11/22/2015  5:18 PM:  A1c 11.0, worse from 10.8 back in December.

## 2015-11-22 NOTE — Patient Instructions (Signed)
We will see you next week with the results of your labwork.   In the mean time, keep taking Benadryl as it helps until we have a better sense of what exactly are causing your symptoms.

## 2015-11-23 ENCOUNTER — Other Ambulatory Visit (INDEPENDENT_AMBULATORY_CARE_PROVIDER_SITE_OTHER): Payer: Self-pay

## 2015-11-23 ENCOUNTER — Ambulatory Visit: Payer: Self-pay

## 2015-11-23 DIAGNOSIS — L299 Pruritus, unspecified: Secondary | ICD-10-CM

## 2015-11-23 LAB — CBC WITH DIFFERENTIAL/PLATELET
Basophils Absolute: 0 10*3/uL (ref 0.0–0.2)
Basos: 0 %
EOS (ABSOLUTE): 0.1 10*3/uL (ref 0.0–0.4)
Eos: 2 %
Hematocrit: 44.1 % (ref 34.0–46.6)
Hemoglobin: 14.6 g/dL (ref 11.1–15.9)
Immature Grans (Abs): 0 10*3/uL (ref 0.0–0.1)
Immature Granulocytes: 0 %
Lymphocytes Absolute: 3.9 10*3/uL — ABNORMAL HIGH (ref 0.7–3.1)
Lymphs: 49 %
MCH: 29 pg (ref 26.6–33.0)
MCHC: 33.1 g/dL (ref 31.5–35.7)
MCV: 88 fL (ref 79–97)
Monocytes Absolute: 0.3 10*3/uL (ref 0.1–0.9)
Monocytes: 4 %
Neutrophils Absolute: 3.6 10*3/uL (ref 1.4–7.0)
Neutrophils: 45 %
Platelets: 271 10*3/uL (ref 150–379)
RBC: 5.03 x10E6/uL (ref 3.77–5.28)
RDW: 13.1 % (ref 12.3–15.4)
WBC: 8 10*3/uL (ref 3.4–10.8)

## 2015-11-23 LAB — TSH: TSH: 0.833 u[IU]/mL (ref 0.450–4.500)

## 2015-11-23 LAB — HEPATITIS C ANTIBODY: Hep C Virus Ab: 0.1 s/co ratio (ref 0.0–0.9)

## 2015-11-24 LAB — IRON AND TIBC
Iron Saturation: 20 % (ref 15–55)
Iron: 73 ug/dL (ref 27–159)
Total Iron Binding Capacity: 371 ug/dL (ref 250–450)
UIBC: 298 ug/dL (ref 131–425)

## 2015-11-24 LAB — HIV ANTIBODY (ROUTINE TESTING W REFLEX): HIV Screen 4th Generation wRfx: NONREACTIVE

## 2015-11-24 LAB — FERRITIN: Ferritin: 49 ng/mL (ref 15–150)

## 2015-11-24 NOTE — Progress Notes (Signed)
Case discussed with Dr. Posey Pronto at the time of the visit.  We reviewed the resident's history and exam and pertinent patient test results.  I agree with the assessment, diagnosis, and plan of care documented in the resident's note.  The lesion she described on her thigh may have been a Sales executive" and if so would be consistent with a diagnosis of pityriasis rosea, but if this were the case we would expect to see other skin lesions at this time as well.  With the elevated Hgb and worsening of symptoms in a hot shower polycythemia vera remains in the differential diagnosis and if we are unable to figure out another cause we will begin to evaluate for this possible diagnosis at the next visit.

## 2015-11-29 ENCOUNTER — Encounter: Payer: Self-pay | Admitting: Internal Medicine

## 2015-11-29 ENCOUNTER — Ambulatory Visit (INDEPENDENT_AMBULATORY_CARE_PROVIDER_SITE_OTHER): Payer: Self-pay | Admitting: Internal Medicine

## 2015-11-29 VITALS — BP 138/68 | HR 70 | Temp 98.2°F | Ht 62.0 in | Wt 212.1 lb

## 2015-11-29 DIAGNOSIS — B373 Candidiasis of vulva and vagina: Secondary | ICD-10-CM

## 2015-11-29 DIAGNOSIS — Z79891 Long term (current) use of opiate analgesic: Secondary | ICD-10-CM

## 2015-11-29 DIAGNOSIS — Z794 Long term (current) use of insulin: Secondary | ICD-10-CM

## 2015-11-29 DIAGNOSIS — L299 Pruritus, unspecified: Secondary | ICD-10-CM

## 2015-11-29 DIAGNOSIS — E1165 Type 2 diabetes mellitus with hyperglycemia: Secondary | ICD-10-CM

## 2015-11-29 DIAGNOSIS — N898 Other specified noninflammatory disorders of vagina: Secondary | ICD-10-CM

## 2015-11-29 DIAGNOSIS — IMO0002 Reserved for concepts with insufficient information to code with codable children: Secondary | ICD-10-CM

## 2015-11-29 DIAGNOSIS — Z803 Family history of malignant neoplasm of breast: Secondary | ICD-10-CM

## 2015-11-29 DIAGNOSIS — E118 Type 2 diabetes mellitus with unspecified complications: Secondary | ICD-10-CM

## 2015-11-29 DIAGNOSIS — B3731 Acute candidiasis of vulva and vagina: Secondary | ICD-10-CM

## 2015-11-29 MED ORDER — "INSULIN SYRINGE-NEEDLE U-100 31G X 15/64"" 0.3 ML MISC": Status: DC

## 2015-11-29 MED ORDER — LIRAGLUTIDE 18 MG/3ML ~~LOC~~ SOPN: 1.8000 mg | PEN_INJECTOR | Freq: Every day | SUBCUTANEOUS | Status: DC

## 2015-11-29 MED ORDER — GLUCOSE BLOOD VI STRP: ORAL_STRIP | Status: DC

## 2015-11-29 MED ORDER — INSULIN ASPART PROT & ASPART (70-30 MIX) 100 UNIT/ML ~~LOC~~ SUSP: 5.0000 [IU] | Freq: Three times a day (TID) | SUBCUTANEOUS | Status: DC

## 2015-11-29 NOTE — Patient Instructions (Addendum)
Please bring your glucometer with you next week, so we can take a look at the sugars together.

## 2015-11-29 NOTE — Progress Notes (Signed)
Case discussed with Dr. Patel at the time of the visit.  We reviewed the resident's history and exam and pertinent patient test results.  I agree with the assessment, diagnosis, and plan of care documented in the resident's note. 

## 2015-11-29 NOTE — Progress Notes (Signed)
   Subjective:    Patient ID: [name], female    DOB: 1960/09/07, 54 y.o.   MRN: [id number]  HPI Ms. Heinsohn is a 55 year old female with hypertension, poorly controlled type 2 diabetes, hyperlipidemia, chronic back pain who presents today for itching. Please see assessment & plan for status of chronic medical problems.    Review of Systems  Constitutional: Negative for appetite change and unexpected weight change.  Gastrointestinal: Negative for nausea, vomiting and diarrhea.  Endocrine: Positive for polydipsia.  Genitourinary: Negative for vaginal discharge and menstrual problem.  Skin: Negative for rash.  Neurological: Negative for dizziness.       Objective:   Physical Exam  Constitutional: She is oriented to person, place, and time. She appears well-developed and well-nourished. No distress.  HENT:  Head: Normocephalic and atraumatic.  Eyes: Conjunctivae are normal. No scleral icterus.  Neck: No tracheal deviation present.  Cardiovascular: Normal rate and regular rhythm.  Exam reveals no gallop and no friction rub.   No murmur heard. Pulmonary/Chest: Effort normal. No stridor. No respiratory distress. She has no wheezes.  Genitourinary:  Cervical vault visualized with scant white discharge. No other lesions noted.  Neurological: She is alert and oriented to person, place, and time.  Skin: Skin is warm and dry. No rash noted. She is not diaphoretic.          Assessment & Plan:

## 2015-11-29 NOTE — Assessment & Plan Note (Addendum)
Overview We reviewed the lab work from last week with her which was largely reassuring for many systemic causes. She also notes resolution of her pruritus roughly 2-3 days following her last appointment with me. She again confirms me today that her itching is worse with warm showers and other sources of heat. I also explained to her the importance of a thorough workup in the absence of an attribute a cause which would include mammogram and Pap smear, both of which she is overdue.  Assessment Pruritus of unknown etiology. In the absence of any abnormality noted on a Pap smear or mammogram, adverse effect of her chronic opiate therapy is certainly suspect. Per [facility], she first refilled hydrocodone/acetaminophen 5/500 mg 5 tablets on 11/30/2009 though monthly refills did not begin until 2013. The first mention of itching in her chart is from an ED note dated 12/08/12 at which time she was suffering from some insect bites which may have been unrelated. 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  Plan -Order Pap smear today -Refer for mammogram today. Provided her with a phone number for which she may call to waive her out-of-pocket expenses -Consider JAK2 testing in the future should her symptoms not resolve

## 2015-11-29 NOTE — Assessment & Plan Note (Signed)
Overview In reviewing her deteriorated glycemic control as noted at her last visit with A1c 11.0, she expressed to me today that she is only taking her husband's NovoLog to cover her sugars. She says her blood sugars have been trending mostly in the upper 100s to the low 300s and denies any symptoms of hypoglycemia, like nausea, vomiting, dizziness. In the past, she has not tolerated metformin along with metformin extended release due to GI side effects.   She also reports the lowest A1c she achieved was 8.2 roughly 1 year ago when she was on triple therapy with Lantus, liraglutide, sitagliptin. Following the tragic loss of her son, she became nonadherent dry medications. She is unable to pick up any of these medications as her MAP paperwork is no longer current and is interested in an SLGT2 inhibitor, like canagliflozin.  Assessment Poorly controlled insulin-dependent type 2 diabetes without complications. Glycemic goal for her would be A1c less than 7% for which she is not at goal due to nonadherence in the setting of cost. She does express optimism and motivation to do better.  Plan -Refer to clinical diabetic educator. -Start NovoLog 70/30 to be taken 5 units 3 times daily with meals plan to up titrate per recommendations from the clinical diabetic educator, Ms. Butch Penny Plyler -Refilled test strips and syringes along with providing her sample syringes -Follow-up in 1 week to review her blood sugar readings on this regimen to which she expressed understanding.

## 2015-11-29 NOTE — Assessment & Plan Note (Signed)
Overview She notes me today positive family history of breast cancer. She has had 2 half-sisters, one maternal and one paternal, both of whom were diagnosed at a young age with breast cancer. Her maternal half-sister had negative genetic test since she underwent workup given that she did had a daughter. The last time she went for a mammogram, she reports she was asked to pay out of pocket expenses since they were out of "grant funding." She is agreeable to having one done now since she is long overdue and does risk factors given some family history and age.  Assessment Eligible for screening mammograms. Mammogram 09/07/09 with BI-RADS 1 which is reassuring.  Plan -Refer for screening mammography

## 2015-11-30 ENCOUNTER — Other Ambulatory Visit: Payer: Self-pay | Admitting: Internal Medicine

## 2015-11-30 DIAGNOSIS — Z1231 Encounter for screening mammogram for malignant neoplasm of breast: Secondary | ICD-10-CM

## 2015-11-30 LAB — CYTOLOGY - PAP

## 2015-12-02 DIAGNOSIS — B373 Candidiasis of vulva and vagina: Secondary | ICD-10-CM | POA: Insufficient documentation

## 2015-12-02 DIAGNOSIS — B3731 Acute candidiasis of vulva and vagina: Secondary | ICD-10-CM | POA: Insufficient documentation

## 2015-12-02 MED ORDER — FLUCONAZOLE 150 MG PO TABS: 150.0000 mg | ORAL_TABLET | ORAL | Status: DC

## 2015-12-02 NOTE — Assessment & Plan Note (Signed)
ADDENDUM 12/02/2015  1:04 PM:  Pap unremarkable for signs of cervical dysplasia though notable for vaginal candidiasis, consistent with her poorly controlled diabetes. Called her with instructions to take fluconazole 150mg  x 2 to be taken every 72 hours.

## 2015-12-02 NOTE — Addendum Note (Signed)
Addended by: Riccardo Dubin on: 12/02/2015 01:04 PM   Modules accepted: Orders, SmartSet

## 2015-12-06 ENCOUNTER — Telehealth: Payer: Self-pay | Admitting: Internal Medicine

## 2015-12-06 NOTE — Telephone Encounter (Signed)
Pt states that she used to get a medication for acid reflux doesn't remember the name but would like to get it again.

## 2015-12-06 NOTE — Telephone Encounter (Signed)
Omeprazole previously prescribed.  Is a refill on this appropriate?

## 2015-12-07 NOTE — Telephone Encounter (Signed)
Patient has an appointment with me on June 14th. Would wait until then to further clarify symptoms to determine if Omeprazole would be appropriate. In the meantime, if she is having mealtime reflux, she should avoid laying flat for 30 minutes after a meal and try elevating her head at night when sleeping. She can try over the counter Tums as directed as well. Thanks.

## 2015-12-09 ENCOUNTER — Encounter: Payer: Self-pay | Admitting: Family Medicine

## 2015-12-09 ENCOUNTER — Ambulatory Visit (INDEPENDENT_AMBULATORY_CARE_PROVIDER_SITE_OTHER): Payer: Self-pay | Admitting: Family Medicine

## 2015-12-09 ENCOUNTER — Ambulatory Visit: Payer: Self-pay

## 2015-12-09 VITALS — BP 123/63 | HR 73 | Ht 62.0 in | Wt 212.0 lb

## 2015-12-09 DIAGNOSIS — M19019 Primary osteoarthritis, unspecified shoulder: Secondary | ICD-10-CM

## 2015-12-09 DIAGNOSIS — Z9889 Other specified postprocedural states: Secondary | ICD-10-CM | POA: Insufficient documentation

## 2015-12-09 DIAGNOSIS — M75102 Unspecified rotator cuff tear or rupture of left shoulder, not specified as traumatic: Secondary | ICD-10-CM

## 2015-12-09 DIAGNOSIS — M129 Arthropathy, unspecified: Secondary | ICD-10-CM

## 2015-12-09 MED ORDER — METHYLPREDNISOLONE ACETATE 80 MG/ML IJ SUSP
80.0000 mg | Freq: Once | INTRAMUSCULAR | Status: AC
  Administered 2015-12-09: 80 mg via INTRA_ARTICULAR

## 2015-12-09 NOTE — Progress Notes (Signed)
   Subjective:    Patient ID: [name], female    DOB: 25-Mar-1961, 55 y.o.   MRN: [id number]  HPI Left shoulder pain  We had injected her subacromial bursa in February new improved her symptoms quite a bit. She also had to acromioclavicular joint injections after that and was 75% symptom-free up until about the last 1-2 weeks when she started having problems raising her arm above shoulder level. She has pain with forward lateral or posterior motion. She wants to get another injection today.   Review of Systems No unusual weight change, fever, sweats, chills.    Objective:   Physical Exam  I'll signs are reviewed GEN.: Well-developed overweight female no acute distress Shoulders: Bilaterally symmetrical. Left shoulder has full range of motion but she has significant pain with elevation in any plane above the 90 angle. Positive O'Brien's test. Before meals joint is nontender to palpation. NEURO: Intact sensation bilateral hands VASCULAR: Radial pulses 2+ bilaterally symmetrical SKIN: Skin of the area around the left shoulders without any sign of rash, no lesions, no ecchymoses.  INJECTION: Patient was given informed consent, signed copy in the chart. Appropriate time out was taken. Area prepped and d80 mg/ml plus  4 cc of 1% lidocaine without epinephrine was injected into the left subacromial bursa using a(n) posterior approach. The patient tolerated the procedure well. There were no complications. Post procedure instructions were given.       Assessment & Plan:

## 2015-12-09 NOTE — Assessment & Plan Note (Signed)
I did a second CSI today. We had done one previously in February. She did not get the x-rays as I had planned because when she went there they said there were no orders in the computer. I have verified today that their orders placed. She will get those on her way home. I discussed with her not doing serial injections in this area. I will agree to do one today even though it is only been 3 months since the last one. She has a complicated shoulder with lots of acromioclavicular joint arthritis and that improved after 2 injections and she's not having that kind of pain right now. She is having more impingement, pain so we'll do the subacromial injection, get the x-rays and then follow-up.

## 2015-12-15 DIAGNOSIS — G5601 Carpal tunnel syndrome, right upper limb: Secondary | ICD-10-CM | POA: Insufficient documentation

## 2015-12-19 ENCOUNTER — Other Ambulatory Visit: Payer: Self-pay

## 2015-12-19 DIAGNOSIS — M549 Dorsalgia, unspecified: Principal | ICD-10-CM

## 2015-12-19 DIAGNOSIS — G8929 Other chronic pain: Secondary | ICD-10-CM

## 2015-12-19 MED ORDER — HYDROCODONE-ACETAMINOPHEN 10-325 MG PO TABS: 1.0000 | ORAL_TABLET | Freq: Three times a day (TID) | ORAL | Status: DC | PRN

## 2015-12-19 MED ORDER — TRAMADOL HCL 50 MG PO TABS: 100.0000 mg | ORAL_TABLET | Freq: Three times a day (TID) | ORAL | Status: DC | PRN

## 2015-12-19 NOTE — Telephone Encounter (Signed)
Last visit: 11/22/2015 Next Visit: 12/28/2015 Last UDS: 10/22/2014 (+ for cannabinoids) Both last written: 11/17/2015

## 2015-12-19 NOTE — Telephone Encounter (Signed)
Pt requesting tramadol and hydrocodone to be filled.

## 2015-12-21 NOTE — Telephone Encounter (Signed)
Notified patient.

## 2015-12-22 MED FILL — traMADol HCL 50 MG TABS: 50 | 30 days supply | Qty: 180 | Fill #0

## 2015-12-22 MED FILL — HYDROCODON-APAP 10-325: 10-325 | 30 days supply | Qty: 90 | Fill #0

## 2015-12-26 ENCOUNTER — Ambulatory Visit: Payer: Self-pay

## 2015-12-26 ENCOUNTER — Ambulatory Visit
Admission: RE | Admit: 2015-12-26 | Discharge: 2015-12-26 | Disposition: A | Discharge status: Home / Self Care | Payer: No Typology Code available for payment source | Source: Ambulatory Visit | Attending: Internal Medicine | Admitting: Internal Medicine

## 2015-12-26 DIAGNOSIS — Z1231 Encounter for screening mammogram for malignant neoplasm of breast: Secondary | ICD-10-CM

## 2015-12-28 ENCOUNTER — Ambulatory Visit (INDEPENDENT_AMBULATORY_CARE_PROVIDER_SITE_OTHER): Payer: No Typology Code available for payment source | Admitting: Internal Medicine

## 2015-12-28 ENCOUNTER — Encounter: Payer: Self-pay | Admitting: Internal Medicine

## 2015-12-28 VITALS — BP 131/89 | HR 82 | Temp 98.0°F | Wt 211.1 lb

## 2015-12-28 DIAGNOSIS — Z803 Family history of malignant neoplasm of breast: Secondary | ICD-10-CM

## 2015-12-28 DIAGNOSIS — M549 Dorsalgia, unspecified: Secondary | ICD-10-CM

## 2015-12-28 DIAGNOSIS — R232 Flushing: Secondary | ICD-10-CM

## 2015-12-28 DIAGNOSIS — N951 Menopausal and female climacteric states: Secondary | ICD-10-CM

## 2015-12-28 DIAGNOSIS — Z9114 Patient's other noncompliance with medication regimen: Secondary | ICD-10-CM

## 2015-12-28 DIAGNOSIS — E1165 Type 2 diabetes mellitus with hyperglycemia: Secondary | ICD-10-CM

## 2015-12-28 DIAGNOSIS — Z596 Low income: Secondary | ICD-10-CM

## 2015-12-28 DIAGNOSIS — Z794 Long term (current) use of insulin: Secondary | ICD-10-CM

## 2015-12-28 DIAGNOSIS — G8929 Other chronic pain: Secondary | ICD-10-CM

## 2015-12-28 DIAGNOSIS — E118 Type 2 diabetes mellitus with unspecified complications: Principal | ICD-10-CM

## 2015-12-28 MED ORDER — TRAMADOL HCL 50 MG PO TABS: 100.0000 mg | ORAL_TABLET | Freq: Three times a day (TID) | ORAL | Status: DC | PRN

## 2015-12-28 MED ORDER — INSULIN GLARGINE 100 UNIT/ML ~~LOC~~ SOLN: SUBCUTANEOUS | Status: DC

## 2015-12-28 MED ORDER — HYDROCODONE-ACETAMINOPHEN 10-325 MG PO TABS: 1.0000 | ORAL_TABLET | Freq: Three times a day (TID) | ORAL | Status: DC | PRN

## 2015-12-28 MED ORDER — INSULIN ASPART 100 UNIT/ML ~~LOC~~ SOLN: 10.0000 [IU] | Freq: Three times a day (TID) | SUBCUTANEOUS | Status: DC

## 2015-12-28 NOTE — Patient Instructions (Signed)
Please take your Insulin as follows:  1. Lantus 20 units at night.  2. Novolog 10 units with meals three times a day.   Please try to follow up with the MAPs program to obtain your medication.

## 2015-12-28 NOTE — Progress Notes (Signed)
Patient ID: [name], female   DOB: Jul 02, 1961, 55 y.o.   MRN: [id number]   Subjective:   Patient ID: [name] female   DOB: 1960/11/11 55 y.o.   MRN: [id number]  HPI: [name] is a 55 y.o. female with PMH as listed below who presents for management of her T2DM. Please see problem list for status of patient's chronic medical conditions.    Past Medical History  Diagnosis Date  . Adhesive capsulitis of right shoulder     with underlying tendinopathy rotator cuff  . History of post-sterilization tuboplasty 2000  . Tear of meniscus of left knee     x2  . Tear of meniscus of right knee   . Plantar fasciitis     Right  . Shortness of breath     with exertion  . Diabetes mellitus     oral tx  . GERD (gastroesophageal reflux disease)   . Arthritis   . Sleep apnea 5 plus yrs    study -pt could not sleep test inconclusive.    Current Outpatient Prescriptions  Medication Sig Dispense Refill  . cyclobenzaprine (FLEXERIL) 10 MG tablet Take 1 tablet (10 mg total) by mouth 2 (two) times daily. 60 tablet 4  . HYDROcodone-acetaminophen (NORCO) 10-325 MG tablet Take 1 tablet by mouth 3 (three) times daily as needed. 90 tablet 0  . traMADol (ULTRAM) 50 MG tablet Take 2 tablets (100 mg total) by mouth every 8 (eight) hours as needed. 180 tablet 0  . fluconazole (DIFLUCAN) 150 MG tablet Take 1 tablet (150 mg total) by mouth every 3 (three) days. (Patient not taking: Reported on 12/28/2015) 2 tablet 0  . glucose blood (AGAMATRIX PRESTO TEST) test strip Use as instructed (Patient not taking: Reported on 12/28/2015) 50 each 12  . insulin aspart (NOVOLOG) 100 UNIT/ML injection Inject 10 Units into the skin 3 (three) times daily before meals. 10 mL 11  . insulin glargine (LANTUS) 100 UNIT/ML injection Inject 20 units at bedtime 10 mL 11  . meloxicam (MOBIC) 15 MG tablet Take 15 mg by mouth. Reported on 12/28/2015     No current facility-administered medications for this  visit.   Family History  Problem Relation Age of Onset  . Breast cancer Sister     both sisters have breast cancer   Social History   Social History  . Marital Status: Married    Spouse Name: N/A  . Number of Children: N/A  . Years of Education: GED +1 yr   Occupational History  . unemployed Unemployed   Social History Main Topics  . Smoking status: Never Smoker   . Smokeless tobacco: Never Used  . Alcohol Use: No  . Drug Use: No  . Sexual Activity: Not Asked   Other Topics Concern  . None   Social History Narrative   Married, unemployed, Husband disabled and paraplegic 2/2 fall from tree stand while deer hunting, Son quadriplegic 2/2 MVA 05/2007 in [place]   Review of Systems: Review of Systems  Constitutional: Positive for diaphoresis. Negative for fever and chills.       Hot flashes  Respiratory: Negative for shortness of breath.   Cardiovascular: Negative for chest pain.  Gastrointestinal: Negative for abdominal pain, diarrhea and constipation.  Genitourinary: Negative for dysuria.  Musculoskeletal: Positive for joint pain.  Neurological: Negative for dizziness and loss of consciousness.    Objective:  Physical Exam: Filed Vitals:   12/28/15 1553  BP: 131/89  Pulse: 82  Temp: 98 F (  36.7 C)  TempSrc: Oral  Weight: 211 lb 1.6 oz (95.754 kg)  SpO2: 98%   Physical Exam  Constitutional: She is oriented to person, place, and time. She appears well-developed and well-nourished. No distress.  Obese woman, conversing well  Eyes: EOM are normal.  Cardiovascular: Normal rate and regular rhythm.   No murmur heard. Pulmonary/Chest: Effort normal. She has no wheezes. She has no rales.  Abdominal: Soft. There is no tenderness.  Musculoskeletal: She exhibits no edema.  Neurological: She is alert and oriented to person, place, and time.  Skin: Skin is warm.    Assessment & Plan:  Please see problem list for current assessment and plan.

## 2015-12-29 DIAGNOSIS — R232 Flushing: Secondary | ICD-10-CM | POA: Insufficient documentation

## 2015-12-29 NOTE — Assessment & Plan Note (Signed)
Patient reports experiencing hot flashes over the last month. She says her LMP began last month as well after about a 3 month absence. She has tried an OTC medication without relief.  A/P: Likely perimenopausal hot flashes. I discussed symptomatic measures with her. Would consider treatment with Venlafaxine when her ability to obtain medication is sorted out.

## 2015-12-29 NOTE — Assessment & Plan Note (Addendum)
Patient states that she has been out of insulin for a prolonged period of time, except for the samples she is given from clinic or when she uses her husband's extra insulin. She says her husband's medications were recently changed, so she is now using the Lantus and Novolog he is no longer using. She says she has been doing this for the last week, taking Novolog 5 units TID with meals, and Lantus (either 15 or 20 units) at night. This is an ongoing issue since the unfortunate passing of her son, Geoffery Spruce.   Her Hgb A1C was 11.0 on 11/22/15, 10.8 prior to that. She reports blood sugars in the 200s prior to using her husband's insulin. She now reports CBGs from 160-180s. She did not bring her meter as she says it is no longer working. She reports completing paperwork for the [employer]s program about 3 weeks ago, but has not heard back from them for assistance with obtaining her medication. She reports good adherence to medications and diet prior to this. She has not tolerated Metformin due to GI side effects in the past.  A/P: Poorly controlled with nonadherence due to costs. We had a long discussion (>20 minutes) regarding the barriers to her adherence, including costs, education, and motivation. Due to time, most of the visit was directed toward her diabetic control and her other chronic medical conditions were not addressed this visit. We spoke about the changes in her motivation after the passing of her son. She reports thinking about him more recently and that he is looking over her and says she needs to work on better control of her diabetes. I have given her sample pens of Novolog and Lantus and instructed her to increase her insulin as described below. We will also try to contact [employer] to determine her eligibility for medication assistance. -Increase Novolog to 10 units three times a day with meals -Increase Lantus to 20 units qhs -Patient given sample pens of Novolog and Lantus this visit -Repeat Hgb A1C in 2  months

## 2015-12-29 NOTE — Assessment & Plan Note (Signed)
Mammogram results from 12/27/15 reviewed with patient.  A/P: Mammogram 12/27/15 with BI-RADS 1, negative which is reassuring. Patient understands the results.

## 2015-12-30 ENCOUNTER — Telehealth: Payer: Self-pay

## 2015-12-30 NOTE — Telephone Encounter (Signed)
Cant find any on current med list

## 2015-12-30 NOTE — Telephone Encounter (Signed)
Pt requesting itching cream to be filled.

## 2015-12-30 NOTE — Progress Notes (Signed)
Internal Medicine Clinic Attending  Case discussed with Dr. Patel,Vishal at the time of the visit.  We reviewed the resident's history and exam and pertinent patient test results.  I agree with the assessment, diagnosis, and plan of care documented in the resident's note.  

## 2016-01-03 MED ORDER — HYDROXYZINE HCL 25 MG PO TABS: 25.0000 mg | ORAL_TABLET | Freq: Three times a day (TID) | ORAL | Status: DC | PRN

## 2016-01-03 MED ORDER — DICLOFENAC SODIUM 1 % TD GEL: 2.0000 g | Freq: Four times a day (QID) | TRANSDERMAL | Status: DC

## 2016-01-03 NOTE — Telephone Encounter (Signed)
Spoke to patient. She has been prescribed hydroxyzine for pruritus which was helpful. Will also prescribe Voltaren gel for her shoulder pain.

## 2016-01-04 ENCOUNTER — Ambulatory Visit: Payer: No Typology Code available for payment source | Admitting: Internal Medicine

## 2016-01-06 ENCOUNTER — Telehealth: Payer: Self-pay | Admitting: Internal Medicine

## 2016-01-06 NOTE — Telephone Encounter (Signed)
   Reason for call:   I received a call from [name] on behalf of her husband sometime this morning indicating she needed to pick up her husband's pain medication.   Pertinent Data:   She reports calling in to ensure [name] will be ready for her pickup though never received a call back.   Her husband will run out of medication today.   Assessment / Plan / Recommendations:   I told her that I cannot authorize a refill or distribute prescriptions but will route a message to the PCP to ensure that he does receive it on Monday.  As always, pt is advised that if symptoms worsen or new symptoms arise, they should go to an urgent care facility or to to ER for further evaluation.   Marisa Dubin, MD   01/06/2016, 11:17 AM

## 2016-01-19 ENCOUNTER — Telehealth: Payer: Self-pay

## 2016-01-19 NOTE — Telephone Encounter (Signed)
Pt requesting pain meds refilled.

## 2016-01-19 NOTE — Telephone Encounter (Signed)
Last office visit: 12/28/2015 Next appointment: n/a  Last written: 12/28/2915 Last UDS: 10/29/2014 +THC

## 2016-01-20 MED FILL — traMADol HCL 50 MG TABS: 50 | 30 days supply | Qty: 180 | Fill #0

## 2016-01-20 MED FILL — HYDROCODON-APAP 10-325: 10-325 | 30 days supply | Qty: 90 | Fill #0

## 2016-01-20 NOTE — Telephone Encounter (Signed)
Pt calling back requesting a refilled on pain med. Please call pt back.

## 2016-01-20 NOTE — Telephone Encounter (Signed)
Pt has picked up these scripts, spoke to her on the ph and she then remembered picking them up

## 2016-01-23 ENCOUNTER — Telehealth: Payer: Self-pay | Admitting: *Deleted

## 2016-01-23 NOTE — Telephone Encounter (Signed)
Yes, that is fine, you can tell her she can take it over the counter. Thank you.

## 2016-01-23 NOTE — Telephone Encounter (Signed)
States she has already tried Prilosec OTC. Added patient to [facility] for Wednesday.

## 2016-01-23 NOTE — Telephone Encounter (Signed)
Patient requesting a reorder of Prilosec. She states she has tried over the counter antacids and said they are not working. Is it okay for me to tell her she can take this over the counter or could you write an RX? Thanks!

## 2016-01-25 ENCOUNTER — Ambulatory Visit: Payer: No Typology Code available for payment source

## 2016-01-27 ENCOUNTER — Ambulatory Visit: Payer: No Typology Code available for payment source

## 2016-02-13 ENCOUNTER — Other Ambulatory Visit: Payer: Self-pay | Admitting: Student in an Organized Health Care Education/Training Program

## 2016-02-13 ENCOUNTER — Telehealth: Payer: Self-pay | Admitting: *Deleted

## 2016-02-13 DIAGNOSIS — M549 Dorsalgia, unspecified: Principal | ICD-10-CM

## 2016-02-13 DIAGNOSIS — G8929 Other chronic pain: Secondary | ICD-10-CM

## 2016-02-13 MED ORDER — HYDROCODONE-ACETAMINOPHEN 10-325 MG PO TABS: 1.0000 | ORAL_TABLET | Freq: Three times a day (TID) | ORAL | 0 refills | Status: DC | PRN

## 2016-02-13 MED ORDER — TRAMADOL HCL 50 MG PO TABS: 100.0000 mg | ORAL_TABLET | Freq: Three times a day (TID) | ORAL | 0 refills | Status: DC | PRN

## 2016-02-13 NOTE — Telephone Encounter (Signed)
Patient notified and scheduled appointment with Dr. Posey Pronto for August.

## 2016-02-13 NOTE — Telephone Encounter (Signed)
Chronic pain generator due to fibromyalgia. Last rx of hydrocodone and tramadol was 7/7 outside an office visit. She had a death in the family and is planning for travel the next few weeks. I am providing her with a paper rx for one month refill for after 8/3. She currently has no future appointments, please schedule for Dr. Posey Pronto as soon as she returns. Dr. Posey Pronto should address the chronic pain syndrome in his office visit, needs an updated tox screen, and needs an updated medication contract.

## 2016-02-13 NOTE — Telephone Encounter (Signed)
Patient requesting early refill of Norco because her mother passed away and she is traveling to [place] or tomorrow. RX would be due to fill 02/19/16. Normally fills RX at [facility]. Please advise.

## 2016-02-17 MED FILL — traMADol HCL 50 MG TABS: 50 | 30 days supply | Qty: 180 | Fill #0

## 2016-02-17 MED FILL — HYDROCODON-APAP 10-325: 10-325 | 30 days supply | Qty: 90 | Fill #0

## 2016-02-29 ENCOUNTER — Ambulatory Visit (INDEPENDENT_AMBULATORY_CARE_PROVIDER_SITE_OTHER): Payer: No Typology Code available for payment source | Admitting: Internal Medicine

## 2016-02-29 ENCOUNTER — Encounter: Payer: Self-pay | Admitting: Internal Medicine

## 2016-02-29 ENCOUNTER — Encounter (INDEPENDENT_AMBULATORY_CARE_PROVIDER_SITE_OTHER): Payer: Self-pay

## 2016-02-29 VITALS — BP 140/73 | HR 75 | Temp 98.1°F | Ht 61.0 in | Wt 214.5 lb

## 2016-02-29 DIAGNOSIS — E118 Type 2 diabetes mellitus with unspecified complications: Principal | ICD-10-CM

## 2016-02-29 DIAGNOSIS — M797 Fibromyalgia: Secondary | ICD-10-CM

## 2016-02-29 DIAGNOSIS — Z794 Long term (current) use of insulin: Secondary | ICD-10-CM

## 2016-02-29 DIAGNOSIS — G8929 Other chronic pain: Secondary | ICD-10-CM

## 2016-02-29 DIAGNOSIS — E1165 Type 2 diabetes mellitus with hyperglycemia: Secondary | ICD-10-CM

## 2016-02-29 DIAGNOSIS — M549 Dorsalgia, unspecified: Secondary | ICD-10-CM

## 2016-02-29 DIAGNOSIS — F1120 Opioid dependence, uncomplicated: Secondary | ICD-10-CM

## 2016-02-29 DIAGNOSIS — E119 Type 2 diabetes mellitus without complications: Secondary | ICD-10-CM

## 2016-02-29 DIAGNOSIS — Z79891 Long term (current) use of opiate analgesic: Secondary | ICD-10-CM

## 2016-02-29 MED ORDER — TRAMADOL HCL 50 MG PO TABS: 100.0000 mg | ORAL_TABLET | Freq: Three times a day (TID) | ORAL | 0 refills | Status: DC | PRN

## 2016-02-29 MED ORDER — HYDROCODONE-ACETAMINOPHEN 10-325 MG PO TABS: 1.0000 | ORAL_TABLET | Freq: Three times a day (TID) | ORAL | 0 refills | Status: DC | PRN

## 2016-02-29 NOTE — Patient Instructions (Signed)
Please take Lantus 20 units at night and Novolog 10 units three times a day with meals.  Follow up in 1 month.

## 2016-02-29 NOTE — Progress Notes (Signed)
   CC: Chronic pain  HPI:  [name] is a 55 y.o. female with PMH as listed below who presents for follow up management of her chronic pain and T2DM.  Patient has chronic pain in multiple joints and history of fibromyalgia for which she has been taking chronic narcotics. She currently takes: Norco 10-325 mg 1 tablet TID as needed #90/month Tramadol 50 mg 2 tablets q8h as needed #180/month  She reports her pain medications help her ambulate, complete her daily chores, assist her husband who is wheelchair bound, and play with her grandchildren. She states she would have difficulty getting out of bed if she did not take her medications. She last took her medication in the morning same day as this visit. She has tried at least Gabapentin, Lyrica, Cymbalta, and Elavil for fibromyalgia which were not continue either due to intolerance, cost, or ineffectiveness. She follows with Sports Medicine for shoulder pain and Plastic surgery in [facility] for Dupuytren's contractures.  For her T2DM, she has continued issues with adherence due to financial constraints as well as motivation. She continues to take Lantus 15 units at night after we had discussed increasing this to 20 units qhs on last visit. She takes Novolog 10 units three times a day with meals (uses husbands Novolog). She is still needing to work with MAPs for medication assistance. Last Hgb A1c was 11.0 on 11/22/15.  Past Medical History:  Diagnosis Date  . 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 Arthritis   . Diabetes mellitus    oral tx  . GERD (gastroesophageal reflux disease)   . History of post-sterilization tuboplasty 2000  . Plantar fasciitis    Right  . Shortness of breath    with exertion  . Sleep apnea 5 plus yrs   study -pt could not sleep test inconclusive.   . Tear of meniscus of left knee    x2  . Tear of meniscus of right knee     Review of Systems:   Review  of Systems  Respiratory: Negative for cough and shortness of breath.   Cardiovascular: Negative for chest pain and leg swelling.  Musculoskeletal: Positive for back pain, joint pain and myalgias.  Neurological: Negative for sensory change and focal weakness.     Physical Exam:  Vitals:   02/29/16 1621  BP: 140/73  Pulse: 75  Temp: 98.1 F (36.7 C)  TempSrc: Oral  SpO2: 99%  Weight: 214 lb 8 oz (97.3 kg)  Height: 5\' 1"  (1.549 m)   Physical Exam  Constitutional: She is oriented to person, place, and time.  HENT:  Head: Normocephalic and atraumatic.  Cardiovascular: Normal rate and regular rhythm.   Pulmonary/Chest: Effort normal. No respiratory distress. She has no wheezes. She has no rales.  Musculoskeletal: She exhibits no edema or tenderness.  Neurological: She is alert and oriented to person, place, and time.    Assessment & Plan:   See Encounters Tab for problem based charting.  Patient discussed with Dr. Evette Doffing

## 2016-03-01 DIAGNOSIS — Z79891 Long term (current) use of opiate analgesic: Secondary | ICD-10-CM | POA: Insufficient documentation

## 2016-03-01 NOTE — Assessment & Plan Note (Signed)
She has continued issues with adherence due to financial constraints as well as motivation. She continues to take Lantus 15 units at night after we had discussed increasing this to 20 units qhs on last visit. She takes Novolog 10 units three times a day with meals (uses husbands Novolog). She is still needing to work with MAPs for medication assistance. Last Hgb A1c was 11.0 on 11/22/15. Due to time constraints, Hgb A1c was not completed this visit. A standing order is placed and patient states she will return to have this drawn. -Patient advised to increase Lantus to 20 units qhs as previously discussed -Continue Novolog 10 units TIDAC -Standing order for Hgb A1c placed, check next visit if not done in the interim

## 2016-03-01 NOTE — Assessment & Plan Note (Signed)
Patient has chronic pain in multiple joints and history of fibromyalgia for which she has been taking chronic narcotics. She currently takes: Norco 10-325 mg 1 tablet TID as needed #90/month Tramadol 50 mg 2 tablets q8h as needed #180/month  She reports her pain medications help her ambulate, complete her daily chores, assist her husband who is wheelchair bound, and play with her grandchildren. She states she would have difficulty getting out of bed if she did not take her medications. She last took her medication in the morning same day as this visit. She has tried at least Gabapentin, Lyrica, Cymbalta, and Elavil for fibromyalgia which were not continue either due to intolerance, cost, or ineffectiveness. She follows with Sports Medicine for shoulder pain and Plastic surgery in [facility] for Dupuytren's contractures. -Renewed pain contract 02/29/16 - discussed contract stipulations and expectations with patient, she understands and agrees. Signed copy given to patient. -Urine tox screen collected -Refilled 3 separate prescriptions for Norco and Tramadol and given to patient -Norco 10-325 mg 1 tablet TID as needed #90/month -Tramadol 50 mg 2 tablets q8h as needed #180/month

## 2016-03-01 NOTE — Progress Notes (Signed)
Internal Medicine Clinic Attending  Case discussed with Dr. Patel,Vishal at the time of the visit.  We reviewed the resident's history and exam and pertinent patient test results.  I agree with the assessment, diagnosis, and plan of care documented in the resident's note.  

## 2016-03-07 ENCOUNTER — Ambulatory Visit (HOSPITAL_COMMUNITY)
Admission: RE | Admit: 2016-03-07 | Discharge: 2016-03-07 | Disposition: A | Discharge status: Home / Self Care | Payer: No Typology Code available for payment source | Source: Ambulatory Visit | Attending: Family Medicine | Admitting: Family Medicine

## 2016-03-07 DIAGNOSIS — M19019 Primary osteoarthritis, unspecified shoulder: Secondary | ICD-10-CM

## 2016-03-07 DIAGNOSIS — M129 Arthropathy, unspecified: Secondary | ICD-10-CM | POA: Insufficient documentation

## 2016-03-07 DIAGNOSIS — M25512 Pain in left shoulder: Secondary | ICD-10-CM | POA: Insufficient documentation

## 2016-03-09 ENCOUNTER — Ambulatory Visit (INDEPENDENT_AMBULATORY_CARE_PROVIDER_SITE_OTHER): Payer: Self-pay | Admitting: Family Medicine

## 2016-03-09 ENCOUNTER — Encounter: Payer: Self-pay | Admitting: Family Medicine

## 2016-03-09 VITALS — BP 149/71 | Ht 62.0 in | Wt 210.0 lb

## 2016-03-09 DIAGNOSIS — M25512 Pain in left shoulder: Secondary | ICD-10-CM

## 2016-03-09 DIAGNOSIS — M19019 Primary osteoarthritis, unspecified shoulder: Secondary | ICD-10-CM

## 2016-03-09 DIAGNOSIS — S4992XD Unspecified injury of left shoulder and upper arm, subsequent encounter: Secondary | ICD-10-CM

## 2016-03-09 DIAGNOSIS — M129 Arthropathy, unspecified: Secondary | ICD-10-CM

## 2016-03-09 MED ORDER — METHYLPREDNISOLONE ACETATE 40 MG/ML IJ SUSP
40.0000 mg | Freq: Once | INTRAMUSCULAR | Status: AC
  Administered 2016-03-09: 40 mg via INTRA_ARTICULAR

## 2016-03-09 NOTE — Patient Instructions (Signed)
After  You recover from your hand  Surgery if the LEFT shoulder is still bothering you we will probably need to get you set up for an MRI of the shoulder. Best of luck!

## 2016-03-10 LAB — TOXASSURE SELECT,+ANTIDEPR,UR: PDF: 0

## 2016-03-12 NOTE — Assessment & Plan Note (Signed)
CSI today X rays of shoulder showed moderate AC arthritis. Suspect MRI would be more revealing

## 2016-03-12 NOTE — Progress Notes (Signed)
    CHIEF COMPLAINT / HPI:   Left shoulder pain: She is getting ready to have surgery on her right hand. Her left shoulder is really bothering her and she's afraid that during the rehabilitation. For her right hand, her left shoulder pain will impede her ability to care for herself area she like to get injections today and then further consider other options for the hand surgery. Pain is aching in nature 6 out of 10 during the day an 8 out of 10 at night. Unable to lie on the left side. She has pain with any motion or her left arm crosses Center anything where she must raise arm above shoulder height.  REVIEW OF SYSTEMS:  She's had about a 10 pound weight gain which she attributes to poor dietary control area she's had no numbness or tingling in her left hand. She's had no swelling of the left shoulder. No fever, sweats, chills.  OBJECTIVE:  Vital signs are reviewed.   GEN.: Well-developed overweight female no acute distress SHOULDERS: Symmetrical area left shoulder tender to palpation over the before meals joint. Crossover test causes pain in the before meals joint. She has intact strength in all planes the rotator cuff but significant pain with supraspinatus testing. VASCULAR: Radial pulses are 2+ bilaterally equal NEURO: Intact soft touch sensation in normal grip strength bilateral hands. PROCEDURE: INJECTION: Patient was given informed consent, signed copy in the chart. Appropriate time out was taken. Area prepped and draped in usual sterile fashion. 1 cc of methylprednisolone 40 mg/ml plus  4 cc of 1% lidocaine without epinephrine was injected into the left subacromial bursa using a(n) posterior approach. The patient tolerated the procedure well. There were no complications. Post procedure instructions were given. INJECTION: Patient was given informed consent, signed copy in the chart. Appropriate time out was taken. Area prepped and draped in usual sterile fashion. 1 cc of  methylprednisolone 40 mg/ml plus  1 cc of 1% lidocaine without epinephrine was injected into the left acromioclavicular joint using a(n) perpendicular approach. The patient tolerated the procedure well. There were no complications. Post procedure instructions were given.    ASSESSMENT / PLAN: Multiple problems in the left shoulder. We did acromioclavicular and subacromial injections today. After she gets through with the hand surgery, she'll colicin I think it's time to get MRI of her shoulder joint which would also include the before meals joint. Potentially she could benefit from acromioplasty. Please see problem oriented charting for details

## 2016-03-21 ENCOUNTER — Telehealth: Payer: Self-pay | Admitting: *Deleted

## 2016-03-21 DIAGNOSIS — M75102 Unspecified rotator cuff tear or rupture of left shoulder, not specified as traumatic: Secondary | ICD-10-CM

## 2016-03-21 MED FILL — traMADol HCL 50 MG TABS: 50 | 30 days supply | Qty: 180 | Fill #0

## 2016-03-21 MED FILL — HYDROCODON-APAP 10-325: 10-325 | 30 days supply | Qty: 90 | Fill #0

## 2016-03-21 NOTE — Telephone Encounter (Signed)
Order placed and patient will call to schedule MRI

## 2016-04-01 ENCOUNTER — Inpatient Hospital Stay: Admission: RE | Admit: 2016-04-01 | Payer: No Typology Code available for payment source | Source: Ambulatory Visit

## 2016-04-03 ENCOUNTER — Ambulatory Visit (INDEPENDENT_AMBULATORY_CARE_PROVIDER_SITE_OTHER): Payer: No Typology Code available for payment source | Admitting: Internal Medicine

## 2016-04-03 VITALS — BP 154/68 | HR 69 | Temp 97.8°F | Ht 62.0 in | Wt 217.2 lb

## 2016-04-03 DIAGNOSIS — R21 Rash and other nonspecific skin eruption: Secondary | ICD-10-CM

## 2016-04-03 DIAGNOSIS — E1165 Type 2 diabetes mellitus with hyperglycemia: Secondary | ICD-10-CM

## 2016-04-03 DIAGNOSIS — Z794 Long term (current) use of insulin: Secondary | ICD-10-CM

## 2016-04-03 DIAGNOSIS — E118 Type 2 diabetes mellitus with unspecified complications: Secondary | ICD-10-CM

## 2016-04-03 LAB — POCT GLYCOSYLATED HEMOGLOBIN (HGB A1C): Hemoglobin A1C: 8

## 2016-04-03 LAB — GLUCOSE, CAPILLARY: Glucose-Capillary: 176 mg/dL — ABNORMAL HIGH (ref 65–99)

## 2016-04-03 MED ORDER — CETIRIZINE HCL 10 MG PO TABS: 10.0000 mg | ORAL_TABLET | Freq: Every day | ORAL | 0 refills | Status: DC

## 2016-04-03 MED ORDER — HYDROXYZINE HCL 25 MG PO TABS: 25.0000 mg | ORAL_TABLET | Freq: Three times a day (TID) | ORAL | 0 refills | Status: DC | PRN

## 2016-04-03 MED ORDER — TRIAMCINOLONE ACETONIDE 0.1 % EX LOTN: 1.0000 "application " | TOPICAL_LOTION | Freq: Three times a day (TID) | CUTANEOUS | 0 refills | Status: DC

## 2016-04-03 NOTE — Progress Notes (Signed)
   CC: Rash on her right side of abdomen.  HPI:  [name] is a 55 y.o. with past medical history as listed below, came to the clinic with complaint of itchy rash on her right side of abdomen started on Friday. It started as a few red spots associated with itch. She noticed spread on both side of her upper abdomen especially under her breast more on the left. She tried some over-the-counter hydrocortisone cream and Benadryl without much relief. She states that she do get this type of rash every 3-4 month. She states that she has noticed getting these type of rashes before and after touching mangoes, and she did touch some mangoes on Friday. She was referred in the past for allergy testing but unable to go for the appointment. She denies any new food, recent change in her soap or detergent. She denies any fever, pain, nausea, vomiting.  Past Medical History:  Diagnosis Date  . 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 Arthritis   . Diabetes mellitus    oral tx  . GERD (gastroesophageal reflux disease)   . History of post-sterilization tuboplasty 2000  . Plantar fasciitis    Right  . Shortness of breath    with exertion  . Sleep apnea 5 plus yrs   study -pt could not sleep test inconclusive.   . Tear of meniscus of left knee    x2  . Tear of meniscus of right knee     Review of Systems:  AS per HPI.  Physical Exam:  Vitals:   04/03/16 1112  BP: (!) 154/68  Pulse: 69  Temp: 97.8 F (36.6 C)  TempSrc: Oral  SpO2: 99%  Weight: 217 lb 3.2 oz (98.5 kg)  Height: 5\' 2"  (1.575 m)   Gen. Well-built, well-nourished lady, no acute distress. Skin. She has multiple erythematous macular spots on her right side of abdomen, she has big erythematous, rash of about 10x4 cm under her right breast. In about 53 cm rash under her left breast. There was no rash on rest of her body. Lungs. Clear bilaterally CV. RRR. Extremities. No  edema, no cyanosis, pulses 2+ bilaterally.  Assessment & Plan:   See Encounters Tab for problem based charting.  Patient seen with Dr. Daryll Drown.

## 2016-04-03 NOTE — Assessment & Plan Note (Signed)
She has uncontrolled diabetes with current A1c today of 8.  Continue with the current management. Consider increasing her insulin during next follow-up.

## 2016-04-03 NOTE — Assessment & Plan Note (Signed)
She came to the clinic with complaint of itchy rash on her right side of abdomen started on Friday. It started as a few red spots associated with itch. She noticed spread on both side of her upper abdomen especially under her breast more on the left. She tried some over-the-counter hydrocortisone cream and Benadryl without much relief. She states that she do get this type of rash every 3-4 month. She states that she has noticed getting these type of rashes before and after touching mangoes, and she did touch some mangoes on Friday. She was referred in the past for allergy testing but unable to go for the appointment.  Assessment. It took like an allergic rash.  Plan. Triamcinolone cream. Atarax 25 mg 3 times a day Ceretec 10 mg daily.   Referral for food allergy testing. We advised her to follow-up at clinic if her rash does not improve within a week, we will do a skin biopsy at that point.

## 2016-04-03 NOTE — Patient Instructions (Addendum)
Thank you for coming to the clinic today. It looks like an allergic reaction.  Please apply this cream to your rash 3 times a day. I'm giving a prescription for Atarax 3 times daily if needed and Zyrtac once daily for your allergic reaction. If your rash does not improve  within next week, you can come back to the clinic and we will do a biopsy for you.

## 2016-04-10 NOTE — Progress Notes (Signed)
Internal Medicine Clinic Attending  I saw and evaluated the patient.  I personally confirmed the key portions of the history and exam documented by Dr. Reesa Chew and I reviewed pertinent patient test results.  The assessment, diagnosis, and plan were formulated together and I agree with the documentation in the resident's note.  Rash appeared to be hive like, did not look like zoster (bilateral and without herpetiform lesions) or fungal infection.  Trial of treatment for acute allergic reaction and close follow up.

## 2016-04-11 ENCOUNTER — Ambulatory Visit
Admission: RE | Admit: 2016-04-11 | Discharge: 2016-04-11 | Disposition: A | Discharge status: Home / Self Care | Payer: No Typology Code available for payment source | Source: Ambulatory Visit | Attending: Family Medicine | Admitting: Family Medicine

## 2016-04-11 DIAGNOSIS — M75102 Unspecified rotator cuff tear or rupture of left shoulder, not specified as traumatic: Secondary | ICD-10-CM

## 2016-04-19 ENCOUNTER — Other Ambulatory Visit: Payer: Self-pay | Admitting: Internal Medicine

## 2016-04-19 DIAGNOSIS — M549 Dorsalgia, unspecified: Principal | ICD-10-CM

## 2016-04-19 DIAGNOSIS — G8929 Other chronic pain: Secondary | ICD-10-CM

## 2016-04-19 MED ORDER — HYDROCODONE-ACETAMINOPHEN 10-325 MG PO TABS: 1.0000 | ORAL_TABLET | Freq: Three times a day (TID) | ORAL | 0 refills | Status: DC | PRN

## 2016-04-19 MED ORDER — TRAMADOL HCL 50 MG PO TABS: 100.0000 mg | ORAL_TABLET | Freq: Three times a day (TID) | ORAL | 0 refills | Status: DC | PRN

## 2016-04-19 NOTE — Telephone Encounter (Signed)
Pt is asking for pain med refills

## 2016-04-20 NOTE — Telephone Encounter (Signed)
Rxs ready for pick up - no answer; left message.

## 2016-04-24 MED FILL — traMADol HCL 50 MG TABS: 50 | 30 days supply | Qty: 180 | Fill #0

## 2016-04-24 MED FILL — HYDROCODON-APAP 10-325: 10-325 | 30 days supply | Qty: 90 | Fill #0

## 2016-04-26 ENCOUNTER — Encounter: Payer: Self-pay | Admitting: *Deleted

## 2016-04-26 DIAGNOSIS — M75102 Unspecified rotator cuff tear or rupture of left shoulder, not specified as traumatic: Secondary | ICD-10-CM | POA: Insufficient documentation

## 2016-04-26 NOTE — Patient Instructions (Signed)
[facility] [address] [place] [address] [phone] Dr Corky Mull Thursday 04/26/16 at 11am

## 2016-05-02 ENCOUNTER — Ambulatory Visit: Payer: No Typology Code available for payment source | Attending: Family Medicine | Admitting: Physical Therapy

## 2016-05-02 ENCOUNTER — Encounter: Payer: Self-pay | Admitting: Physical Therapy

## 2016-05-02 DIAGNOSIS — M6281 Muscle weakness (generalized): Secondary | ICD-10-CM | POA: Insufficient documentation

## 2016-05-02 DIAGNOSIS — M25612 Stiffness of left shoulder, not elsewhere classified: Secondary | ICD-10-CM | POA: Insufficient documentation

## 2016-05-02 DIAGNOSIS — M25512 Pain in left shoulder: Secondary | ICD-10-CM

## 2016-05-07 ENCOUNTER — Ambulatory Visit: Payer: No Typology Code available for payment source | Admitting: Physical Therapy

## 2016-05-07 DIAGNOSIS — M25512 Pain in left shoulder: Secondary | ICD-10-CM

## 2016-05-07 DIAGNOSIS — M25612 Stiffness of left shoulder, not elsewhere classified: Secondary | ICD-10-CM

## 2016-05-07 DIAGNOSIS — M6281 Muscle weakness (generalized): Secondary | ICD-10-CM

## 2016-05-08 NOTE — Therapy (Signed)
[place], [place], [address] Phone: [phone]   Fax:  [phone]  Physical Therapy Treatment  Patient Details  Name: [name] MRN: [id number] Date of Birth: [birth year] Referring Provider: Leontine Locket   Encounter Date: 05/07/2016      PT End of Session - 05/07/16 1656    Visit Number 2   Number of Visits 16   Date for PT Re-Evaluation 06/27/16   Authorization Type CAFA 05/25/2016   PT Start Time 1545   PT Stop Time 1626   PT Time Calculation (min) 41 min   Activity Tolerance Patient limited by pain   Behavior During Therapy [facility] for tasks assessed/performed      Past Medical History:  Diagnosis Date  . 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 Arthritis   . Diabetes mellitus    oral tx  . GERD (gastroesophageal reflux disease)   . History of post-sterilization tuboplasty 2000  . Plantar fasciitis    Right  . Shortness of breath    with exertion  . Sleep apnea 5 plus yrs   study -pt could not sleep test inconclusive.   . Tear of meniscus of left knee    x2  . Tear of meniscus of right knee     Past Surgical History:  Procedure Laterality Date  . CHOLECYSTECTOMY    . FOOT TENDON SURGERY    . KNEE ARTHROSCOPY  10/10/2011   Procedure: ARTHROSCOPY KNEE;  Surgeon: Newt Minion, MD;  Location: [facility];  Service: Orthopedics;  Laterality: Right;  Right Knee Arthroscopy and Excision Medial Meniscus  . MENISCUS REPAIR     R and L (L x2)  . TONSILLECTOMY    . TUBAL LIGATION      There were no vitals filed for this visit.      Subjective Assessment - 05/07/16 1647    Subjective Patient reports significant pain after the last visit. She reports the exercises have caused increased pain and she has not been sleeping at night. Therapy had educated the patient on symptom mangement but she states " im hard headed". Patient strongly  advised to no t perfrom activity that causes pain.    Pertinent History R hand surgery. RTC tear on the left    Limitations House hold activities;Lifting   Diagnostic tests a partical thickness tear of the supraspinatus as well as mild degeneration of the biceps tnedon and moderate AC joint arthritis    Patient Stated Goals To improve motion prior to the surgery    Currently in Pain? Yes   Pain Score 8    Pain Location Shoulder   Pain Orientation Left;Anterior   Pain Descriptors / Indicators Aching;Sharp   Pain Type Acute pain   Pain Onset 1 to 4 weeks ago   Pain Frequency Constant   Aggravating Factors  use of the left arm    Pain Relieving Factors rest    Effect of Pain on Daily Activities Not sleeping at night   Multiple Pain Sites No                         [facility] Adult PT Treatment/Exercise - 05/08/16 0001      Modalities   Modalities Ultrasound;Iontophoresis     Ultrasound   Ultrasound Location left shoulder   Ultrasound Parameters 1.5 w/ cm2 continuous    Ultrasound Goals Pain  Iontophoresis   Type of Iontophoresis Dexamethasone   Location left shoulder    Dose 1cc    Time long release patch      Manual Therapy   Manual therapy comments Grade 1 and 2 PA and inferior glides to improve motionand decrease pain.                 PT Education - 05/07/16 1650    Education provided Yes   Education Details furter education on symptom mangement. Stop HEP   Person(s) Educated Patient   Methods Explanation;Demonstration   Comprehension Verbalized understanding;Returned demonstration;Verbal cues required          PT Short Term Goals - 05/08/16 0829      PT SHORT TERM GOAL #1   Title Patient will increase left  flexion and abduction by 25 degrees    Time 3   Period Weeks   Status On-going     PT SHORT TERM GOAL #2   Title Patient will increase left  passive ER by 20 degrees    Time 3   Period Weeks   Status On-going     PT SHORT  TERM GOAL #3   Title Patient will be independent with stretching and scapualr strengthening program    Time 3   Period Weeks   Status On-going           PT Long Term Goals - 05/02/16 1715      PT LONG TERM GOAL #1   Title Patient will increase gross left shoulder strength to 4/5 in order to perfrom ADL's    Time 6   Period Weeks   Status New     PT LONG TERM GOAL #2   Title Patient will reach behind her head to do her hair without significant pain    Time 3   Period Weeks   Status New     PT LONG TERM GOAL #3   Title Patient will increase gross left range of motion by 30% in order to optimize her surgical outcome.    Time 3   Period Weeks   Status New               Plan - 05/08/16 0819    Clinical Impression Statement Theray focused on reducing pain. If she continues to have significant pain she may need to go back to the MD> She was only given basic AAROM exercises. aIf her pain is under control therapy will assess her technique with exercises. She was advised to hold on exercises at this time.    Rehab Potential Good   PT Frequency 2x / week   PT Duration 6 weeks   PT Treatment/Interventions ADLs/Self Care Home Management;Cryotherapy;Electrical Stimulation;Iontophoresis 4mg /ml Dexamethasone;Moist Heat;Traction;Ultrasound;Stair training;Gait training;Functional mobility training;Patient/family education;Neuromuscular re-education;Therapeutic exercise;Therapeutic activities;Manual techniques;Passive range of motion;Dry needling;Taping;Splinting   PT Next Visit Plan Continue with low grade shoulder mobilizations for pain and mobility. Consider light scpaular strengthening in pain free ranges.    PT Home Exercise Plan wand ER, IR and flexion, pendulums for pain and motion    Consulted and Agree with Plan of Care Patient      Patient will benefit from skilled therapeutic intervention in order to improve the following deficits and impairments:  Decreased activity  tolerance, Pain, Impaired UE functional use, Decreased strength, Decreased mobility, Decreased range of motion, Difficulty walking  Visit Diagnosis: Acute pain of left shoulder  Stiffness of left shoulder, not elsewhere classified  Muscle weakness (generalized)  Problem List Patient Active Problem List   Diagnosis Date Noted  . Pain in joint, shoulder region 03/12/2016  . Chronic narcotic dependence ([place]) 03/01/2016  . Hot flashes 12/29/2015  . Rotator cuff syndrome of left shoulder 12/09/2015  . Rotator cuff syndrome of left shoulder 12/09/2015  . Vaginal candidiasis 12/02/2015  . Family history of breast cancer 11/29/2015  . Acromioclavicular joint arthritis 09/19/2015  . Itching 03/23/2015  . Essential hypertension 12/17/2014  . Rash and nonspecific skin eruption 01/18/2014  . Sesamoiditis thumb IP joint 12/11/2013  . Chronic back pain 06/15/2013  . Tenosynovitis of hand or wrist 03/13/2013  . Dysmenorrhea 12/02/2012  . Insomnia 12/02/2012  . Fibromyalgia 05/23/2012  . Preventative health care 03/01/2011  . HYPERLIPIDEMIA 07/27/2010  . Type 2 diabetes mellitus, uncontrolled ([place]) 07/26/2010  . DUPUYTREN'S CONTRACTURE, RIGHT 10/19/2009  . Plantar fascial fibromatosis 09/06/2006  . ADHESIVE CAPSULITIS, RIGHT 09/04/2006  . Other tear of cartilage or meniscus of knee, current 09/04/2006    Carney Living PT DPT  05/08/2016, 8:30 AM  [place], [place], [address] Phone: [phone]   Fax:  [phone]  Name: Danieliz Forthman MRN: [id number] Date of Birth: 09/20/1960

## 2016-05-10 ENCOUNTER — Ambulatory Visit: Payer: No Typology Code available for payment source | Admitting: Physical Therapy

## 2016-05-14 ENCOUNTER — Ambulatory Visit: Payer: No Typology Code available for payment source | Admitting: Physical Therapy

## 2016-05-14 DIAGNOSIS — M25612 Stiffness of left shoulder, not elsewhere classified: Secondary | ICD-10-CM

## 2016-05-14 DIAGNOSIS — M25512 Pain in left shoulder: Secondary | ICD-10-CM

## 2016-05-14 DIAGNOSIS — M6281 Muscle weakness (generalized): Secondary | ICD-10-CM

## 2016-05-14 NOTE — Therapy (Signed)
[place], [place], [address] Phone: [phone]   Fax:  [phone]  Physical Therapy Evaluation  Patient Details  Name: [name] MRN: [id number] Date of Birth: 1960-10-12 Referring Provider: Dava Najjar   Encounter Date: 05/02/2016    Past Medical History:  Diagnosis Date  . 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 Arthritis   . Diabetes mellitus    oral tx  . GERD (gastroesophageal reflux disease)   . History of post-sterilization tuboplasty 2000  . Plantar fasciitis    Right  . Shortness of breath    with exertion  . Sleep apnea 5 plus yrs   study -pt could not sleep test inconclusive.   . Tear of meniscus of left knee    x2  . Tear of meniscus of right knee     Past Surgical History:  Procedure Laterality Date  . CHOLECYSTECTOMY    . FOOT TENDON SURGERY    . KNEE ARTHROSCOPY  10/10/2011   Procedure: ARTHROSCOPY KNEE;  Surgeon: Newt Minion, MD;  Location: [facility];  Service: Orthopedics;  Laterality: Right;  Right Knee Arthroscopy and Excision Medial Meniscus  . MENISCUS REPAIR     R and L (L x2)  . TONSILLECTOMY    . TUBAL LIGATION      There were no vitals filed for this visit.       Subjective Assessment - 05/14/16 1512    Subjective Patient presents today with left shoulder pain. Her MRI reveals a partical thickness tear of the supraspinatus as well as mild degeneration of the biceps tnedon and moderate AC joint tendinitis. She has been working on stretching at home and feels like the stretching is helping.  She had right hand surgery and has limited function with her right hand.     Pertinent History R hand surgery. RTC tear on the left    Limitations House hold activities;Lifting   How long can you sit comfortably? N/A   How long can you stand comfortably? N/A    How long can you walk comfortably? N/A    Diagnostic  tests MRI    Patient Stated Goals a partical thickness tear of the supraspinatus as well as mild degeneration of the biceps tnedon and moderate AC joint arthritis    Currently in Pain? Yes   Pain Score 8    Pain Location Shoulder   Pain Descriptors / Indicators Aching;Sharp   Pain Type Acute pain   Pain Onset 1 to 4 weeks ago   Pain Frequency Constant   Aggravating Factors  use of the arm    Pain Relieving Factors rest    Effect of Pain on Daily Activities not sleeping at night                                PT Education - 05/14/16 1511    Education provided Yes   Education Details Symptom management    Person(s) Educated Patient   Methods Explanation;Demonstration   Comprehension Verbalized understanding;Returned demonstration;Verbal cues required          PT Short Term Goals - 05/14/16 1511      PT SHORT TERM GOAL #1   Title Patient will increase left  flexion and abduction by 25 degrees    Time 3   Period Weeks   Status New  PT SHORT TERM GOAL #2   Title Patient will increase left  passive ER by 20 degrees    Time 3   Period Weeks   Status New     PT SHORT TERM GOAL #3   Title Patient will be independent with stretching and scapualr strengthening program    Time 3   Period Weeks   Status New           PT Long Term Goals - 05/02/16 1715      PT LONG TERM GOAL #1   Title Patient will increase gross left shoulder strength to 4/5 in order to perfrom ADL's    Time 6   Period Weeks   Status New     PT LONG TERM GOAL #2   Title Patient will reach behind her head to do her hair without significant pain    Time 3   Period Weeks   Status New     PT LONG TERM GOAL #3   Title Patient will increase gross left range of motion by 30% in order to optimize her surgical outcome.    Time 3   Period Weeks   Status New               Plan - 05/14/16 1511    Clinical Impression Statement Patient is a 55 year old female with a left  rotator cuff tear as well as other degeneration in her left shoulder. She has significant limitations in all shoulder motion. She has had hand surgery on the right side which has decreased her fucntional use of her right upper extremity. Patient sent from DM to work on range of motion prior to her surgery. She ahs pain with all end range motions. She would benefit from skilled therapy to work on range of motion while keeping the irritation down. she was seen for a low complexity evaluation.    Rehab Potential Good   PT Frequency 2x / week   PT Duration 6 weeks   PT Treatment/Interventions ADLs/Self Care Home Management;Cryotherapy;Electrical Stimulation;Iontophoresis 4mg /ml Dexamethasone;Moist Heat;Traction;Ultrasound;Stair training;Gait training;Functional mobility training;Patient/family education;Neuromuscular re-education;Therapeutic exercise;Therapeutic activities;Manual techniques;Passive range of motion;Dry needling;Taping;Splinting   PT Next Visit Plan Continue with low grade shoulder mobilizations for pain and mobility. Consider light scpaular strengthening in pain free ranges.    PT Home Exercise Plan wand ER, IR and flexion, pendulums for pain and motion    Consulted and Agree with Plan of Care Patient      Patient will benefit from skilled therapeutic intervention in order to improve the following deficits and impairments:  Decreased activity tolerance, Pain, Impaired UE functional use, Decreased strength, Decreased mobility, Decreased range of motion, Difficulty walking  Visit Diagnosis: Acute pain of left shoulder - Plan: PT plan of care cert/re-cert  Stiffness of left shoulder, not elsewhere classified - Plan: PT plan of care cert/re-cert  Muscle weakness (generalized) - Plan: PT plan of care cert/re-cert     Problem List Patient Active Problem List   Diagnosis Date Noted  . Pain in joint, shoulder region 03/12/2016  . Chronic narcotic dependence ([place]) 03/01/2016  . Hot  flashes 12/29/2015  . Rotator cuff syndrome of left shoulder 12/09/2015  . Rotator cuff syndrome of left shoulder 12/09/2015  . Vaginal candidiasis 12/02/2015  . Family history of breast cancer 11/29/2015  . Acromioclavicular joint arthritis 09/19/2015  . Itching 03/23/2015  . Essential hypertension 12/17/2014  . Rash and nonspecific skin eruption 01/18/2014  . Sesamoiditis thumb IP joint 12/11/2013  .  Chronic back pain 06/15/2013  . Tenosynovitis of hand or wrist 03/13/2013  . Dysmenorrhea 12/02/2012  . Insomnia 12/02/2012  . Fibromyalgia 05/23/2012  . Preventative health care 03/01/2011  . HYPERLIPIDEMIA 07/27/2010  . Type 2 diabetes mellitus, uncontrolled ([place]) 07/26/2010  . DUPUYTREN'S CONTRACTURE, RIGHT 10/19/2009  . Plantar fascial fibromatosis 09/06/2006  . ADHESIVE CAPSULITIS, RIGHT 09/04/2006  . Other tear of cartilage or meniscus of knee, current 09/04/2006    Carney Living PT DPT  05/14/2016, 3:13 PM  [facility] [address] [place], [place], [address] Phone: [phone]   Fax:  [phone]  Name: [name] MRN: [id number] Date of Birth: [birth year]

## 2016-05-14 NOTE — Therapy (Signed)
[place], [place], [address] Phone: [phone]   Fax:  [phone]  Physical Therapy Treatment  Patient Details  Name: [name] MRN: [id number] Date of Birth: 09-19-1960 Referring Provider: Dava Najjar   Encounter Date: 05/14/2016      PT End of Session - 05/14/16 1704    Visit Number 3   Number of Visits 16   Date for PT Re-Evaluation 06/27/16   Authorization Type CAFA 05/25/2016   PT Start Time 1415   PT Stop Time 1453   PT Time Calculation (min) 38 min   Activity Tolerance Patient tolerated treatment well   Behavior During Therapy [facility] for tasks assessed/performed      Past Medical History:  Diagnosis Date  . 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 Arthritis   . Diabetes mellitus    oral tx  . GERD (gastroesophageal reflux disease)   . History of post-sterilization tuboplasty 2000  . Plantar fasciitis    Right  . Shortness of breath    with exertion  . Sleep apnea 5 plus yrs   study -pt could not sleep test inconclusive.   . Tear of meniscus of left knee    x2  . Tear of meniscus of right knee     Past Surgical History:  Procedure Laterality Date  . CHOLECYSTECTOMY    . FOOT TENDON SURGERY    . KNEE ARTHROSCOPY  10/10/2011   Procedure: ARTHROSCOPY KNEE;  Surgeon: Newt Minion, MD;  Location: [facility];  Service: Orthopedics;  Laterality: Right;  Right Knee Arthroscopy and Excision Medial Meniscus  . MENISCUS REPAIR     R and L (L x2)  . TONSILLECTOMY    . TUBAL LIGATION      There were no vitals filed for this visit.      Subjective Assessment - 05/14/16 1702    Subjective Patient was advised to hold on exercises after the last visit. She only hasd 2 light exercises to begin with. She reports no improvement in pain after the last visit. She reports he pain is getting worse. She feels like her neck is starting to hurt  more. She feel s like her pain is making it hard for her sto sleep.    Pertinent History R hand surgery. RTC tear on the left    Limitations House hold activities;Lifting   How long can you sit comfortably? N/A   How long can you stand comfortably? N/A    How long can you walk comfortably? N/A    Diagnostic tests MRI    Patient Stated Goals a partical thickness tear of the supraspinatus as well as mild degeneration of the biceps tnedon and moderate AC joint arthritis    Currently in Pain? Yes   Pain Score 8    Pain Location Shoulder   Pain Orientation Left;Anterior   Pain Descriptors / Indicators Aching;Sharp   Pain Type Acute pain   Pain Onset 1 to 4 weeks ago   Pain Frequency Constant   Aggravating Factors  use of the left arm    Pain Relieving Factors rest    Effect of Pain on Daily Activities Not sleeping                          [facility] Adult PT Treatment/Exercise - 05/14/16 0001      Modalities   Modalities Ultrasound;Iontophoresis  Ultrasound   Ultrasound Location left shoulder    Ultrasound Parameters 1.5 w/cm2    Ultrasound Goals Pain     Manual Therapy   Manual therapy comments Grade 1 and 2 PA and inferior glides to improve motionand decrease pain.                 PT Education - 05/14/16 1704    Education provided Yes   Education Details symptom mangement    Person(s) Educated Patient   Methods Explanation   Comprehension Verbalized understanding;Returned demonstration;Verbal cues required          PT Short Term Goals - 05/14/16 1707      PT SHORT TERM GOAL #1   Title Patient will increase left  flexion and abduction by 25 degrees    Time 3   Period Weeks   Status On-going     PT SHORT TERM GOAL #2   Title Patient will increase left  passive ER by 20 degrees    Time 3   Period Weeks   Status On-going     PT SHORT TERM GOAL #3   Title Patient will be independent with stretching and scapualr strengthening program     Time 3   Period Weeks   Status On-going           PT Long Term Goals - 05/02/16 1715      PT LONG TERM GOAL #1   Title Patient will increase gross left shoulder strength to 4/5 in order to perfrom ADL's    Time 6   Period Weeks   Status New     PT LONG TERM GOAL #2   Title Patient will reach behind her head to do her hair without significant pain    Time 3   Period Weeks   Status New     PT LONG TERM GOAL #3   Title Patient will increase gross left range of motion by 30% in order to optimize her surgical outcome.    Time 3   Period Weeks   Status New               Plan - 05/14/16 1705    Clinical Impression Statement Patient advised that she may need to return to the MD. She continues to have significant pain despite only performing light PROM and Grade I pain mobilizations. She has not gained ROM. She is unable to exercise on her own.    PT Frequency 2x / week   PT Duration 8 weeks   PT Treatment/Interventions ADLs/Self Care Home Management;Cryotherapy;Electrical Stimulation;Iontophoresis 4mg /ml Dexamethasone;Moist Heat;Traction;Ultrasound;Stair training;Gait training;Functional mobility training;Patient/family education;Neuromuscular re-education;Therapeutic exercise;Therapeutic activities;Manual techniques;Passive range of motion;Dry needling;Taping;Splinting   PT Next Visit Plan Continue with low grade shoulder mobilizations for pain and mobility. Consider light scpaular strengthening in pain free ranges.    PT Home Exercise Plan wand ER, IR and flexion, pendulums for pain and motion    Consulted and Agree with Plan of Care Patient      Patient will benefit from skilled therapeutic intervention in order to improve the following deficits and impairments:  Decreased activity tolerance, Pain, Impaired UE functional use, Decreased strength, Decreased mobility, Decreased range of motion, Difficulty walking  Visit Diagnosis: Acute pain of left shoulder  Stiffness  of left shoulder, not elsewhere classified  Muscle weakness (generalized)     Problem List Patient Active Problem List   Diagnosis Date Noted  . Pain in joint, shoulder region 03/12/2016  . Chronic narcotic dependence (  [place]) 03/01/2016  . Hot flashes 12/29/2015  . Rotator cuff syndrome of left shoulder 12/09/2015  . Rotator cuff syndrome of left shoulder 12/09/2015  . Vaginal candidiasis 12/02/2015  . Family history of breast cancer 11/29/2015  . Acromioclavicular joint arthritis 09/19/2015  . Itching 03/23/2015  . Essential hypertension 12/17/2014  . Rash and nonspecific skin eruption 01/18/2014  . Sesamoiditis thumb IP joint 12/11/2013  . Chronic back pain 06/15/2013  . Tenosynovitis of hand or wrist 03/13/2013  . Dysmenorrhea 12/02/2012  . Insomnia 12/02/2012  . Fibromyalgia 05/23/2012  . Preventative health care 03/01/2011  . HYPERLIPIDEMIA 07/27/2010  . Type 2 diabetes mellitus, uncontrolled ([place]) 07/26/2010  . DUPUYTREN'S CONTRACTURE, RIGHT 10/19/2009  . Plantar fascial fibromatosis 09/06/2006  . ADHESIVE CAPSULITIS, RIGHT 09/04/2006  . Other tear of cartilage or meniscus of knee, current 09/04/2006    Carney Living PT DPT  05/14/2016, 5:10 PM  [facility] [address] [place], [place], [address] Phone: [phone]   Fax:  [phone]  Name: [name] MRN: [id number] Date of Birth: [birth year]

## 2016-05-16 ENCOUNTER — Telehealth: Payer: Self-pay | Admitting: Internal Medicine

## 2016-05-16 ENCOUNTER — Ambulatory Visit: Payer: No Typology Code available for payment source | Admitting: Physical Therapy

## 2016-05-16 NOTE — Telephone Encounter (Signed)
APT. REMINDER CALL, LMTCB °

## 2016-05-17 ENCOUNTER — Encounter: Payer: Self-pay | Admitting: Internal Medicine

## 2016-05-17 ENCOUNTER — Ambulatory Visit (INDEPENDENT_AMBULATORY_CARE_PROVIDER_SITE_OTHER): Payer: Self-pay | Admitting: Internal Medicine

## 2016-05-17 VITALS — BP 138/76 | HR 95 | Temp 98.0°F | Wt 220.4 lb

## 2016-05-17 DIAGNOSIS — M7502 Adhesive capsulitis of left shoulder: Secondary | ICD-10-CM

## 2016-05-17 DIAGNOSIS — G4762 Sleep related leg cramps: Secondary | ICD-10-CM

## 2016-05-17 DIAGNOSIS — F1120 Opioid dependence, uncomplicated: Secondary | ICD-10-CM

## 2016-05-17 DIAGNOSIS — Z794 Long term (current) use of insulin: Secondary | ICD-10-CM

## 2016-05-17 DIAGNOSIS — Z0000 Encounter for general adult medical examination without abnormal findings: Secondary | ICD-10-CM

## 2016-05-17 DIAGNOSIS — Z23 Encounter for immunization: Secondary | ICD-10-CM

## 2016-05-17 DIAGNOSIS — Z79899 Other long term (current) drug therapy: Secondary | ICD-10-CM

## 2016-05-17 DIAGNOSIS — E118 Type 2 diabetes mellitus with unspecified complications: Secondary | ICD-10-CM

## 2016-05-17 DIAGNOSIS — Z79891 Long term (current) use of opiate analgesic: Secondary | ICD-10-CM

## 2016-05-17 DIAGNOSIS — M75102 Unspecified rotator cuff tear or rupture of left shoulder, not specified as traumatic: Secondary | ICD-10-CM

## 2016-05-17 DIAGNOSIS — M797 Fibromyalgia: Secondary | ICD-10-CM

## 2016-05-17 DIAGNOSIS — M5126 Other intervertebral disc displacement, lumbar region: Secondary | ICD-10-CM

## 2016-05-17 DIAGNOSIS — I10 Essential (primary) hypertension: Secondary | ICD-10-CM

## 2016-05-17 DIAGNOSIS — IMO0002 Reserved for concepts with insufficient information to code with codable children: Secondary | ICD-10-CM

## 2016-05-17 DIAGNOSIS — E1165 Type 2 diabetes mellitus with hyperglycemia: Secondary | ICD-10-CM

## 2016-05-17 MED ORDER — LIDOCAINE 5 % EX PTCH: 1.0000 | MEDICATED_PATCH | CUTANEOUS | 0 refills | Status: DC

## 2016-05-17 MED ORDER — PREGABALIN 75 MG PO CAPS: 75.0000 mg | ORAL_CAPSULE | Freq: Two times a day (BID) | ORAL | 2 refills | Status: DC

## 2016-05-17 MED ORDER — SITAGLIPTIN PHOSPHATE 100 MG PO TABS: 100.0000 mg | ORAL_TABLET | Freq: Every day | ORAL | 3 refills | Status: DC

## 2016-05-17 NOTE — Patient Instructions (Addendum)
It was a pleasure to see you again [name].  We will hold off on the mealtime Novolog for now as I am concerned about dropping your blood sugars too low.  Please continue the Lantus 20 units at night and Januvia 100 mg daily.  For your shoulder pain, we will try the Lidoderm patches.  For your fibromyalgia we will try Lyrica 75 mg twice a day. This may help with your nighttime calf pain as well. We can increase this dose if needed.  Please hold off on taking your Aleve and Naproxen as this is likely causing your stomach irritation. Please only take one of these at a time in the future to avoid further stomach irritation.  Please follow up with me in 1 month to recheck your Hgb A1c.   Diabetes Mellitus and Food It is important for you to manage your blood sugar (glucose) level. Your blood glucose level can be greatly affected by what you eat. Eating healthier foods in the appropriate amounts throughout the day at about the same time each day will help you control your blood glucose level. It can also help slow or prevent worsening of your diabetes mellitus. Healthy eating may even help you improve the level of your blood pressure and reach or maintain a healthy weight.  General recommendations for healthful eating and cooking habits include:  Eating meals and snacks regularly. Avoid going long periods of time without eating to lose weight.  Eating a diet that consists mainly of plant-based foods, such as fruits, vegetables, nuts, legumes, and whole grains.  Using low-heat cooking methods, such as baking, instead of high-heat cooking methods, such as deep frying. Work with your dietitian to make sure you understand how to use the Nutrition Facts information on food labels. HOW CAN FOOD AFFECT ME? Carbohydrates Carbohydrates affect your blood glucose level more than any other type of food. Your dietitian will help you determine how many carbohydrates to eat at each meal and teach you how  to count carbohydrates. Counting carbohydrates is important to keep your blood glucose at a healthy level, especially if you are using insulin or taking certain medicines for diabetes mellitus. Alcohol Alcohol can cause sudden decreases in blood glucose (hypoglycemia), especially if you use insulin or take certain medicines for diabetes mellitus. Hypoglycemia can be a life-threatening condition. Symptoms of hypoglycemia (sleepiness, dizziness, and disorientation) are similar to symptoms of having too much alcohol.  If your health care provider has given you approval to drink alcohol, do so in moderation and use the following guidelines:  Women should not have more than one drink per day, and men should not have more than two drinks per day. One drink is equal to:  12 oz of beer.  5 oz of wine.  1 oz of hard liquor.  Do not drink on an empty stomach.  Keep yourself hydrated. Have water, diet soda, or unsweetened iced tea.  Regular soda, juice, and other mixers might contain a lot of carbohydrates and should be counted. WHAT FOODS ARE NOT RECOMMENDED? As you make food choices, it is important to remember that all foods are not the same. Some foods have fewer nutrients per serving than other foods, even though they might have the same number of calories or carbohydrates. It is difficult to get your body what it needs when you eat foods with fewer nutrients. Examples of foods that you should avoid that are high in calories and carbohydrates but low in nutrients include:  Trans  fats (most processed foods list trans fats on the Nutrition Facts label).  Regular soda.  Juice.  Candy.  Sweets, such as cake, pie, doughnuts, and cookies.  Fried foods. WHAT FOODS CAN I EAT? Eat nutrient-rich foods, which will nourish your body and keep you healthy. The food you should eat also will depend on several factors, including:  The calories you need.  The medicines you take.  Your weight.  Your  blood glucose level.  Your blood pressure level.  Your cholesterol level. You should eat a variety of foods, including:  Protein.  Lean cuts of meat.  Proteins low in saturated fats, such as fish, egg whites, and beans. Avoid processed meats.  Fruits and vegetables.  Fruits and vegetables that may help control blood glucose levels, such as apples, mangoes, and yams.  Dairy products.  Choose fat-free or low-fat dairy products, such as milk, yogurt, and cheese.  Grains, bread, pasta, and rice.  Choose whole grain products, such as multigrain bread, whole oats, and brown rice. These foods may help control blood pressure.  Fats.  Foods containing healthful fats, such as nuts, avocado, olive oil, canola oil, and fish. DOES EVERYONE WITH DIABETES MELLITUS HAVE THE SAME MEAL PLAN? Because every person with diabetes mellitus is different, there is not one meal plan that works for everyone. It is very important that you meet with a dietitian who will help you create a meal plan that is just right for you.   This information is not intended to replace advice given to you by your health care provider. Make sure you discuss any questions you have with your health care provider.   Document Released: 03/29/2005 Document Revised: 07/23/2014 Document Reviewed: 05/29/2013 Elsevier Interactive Patient Education [employer].

## 2016-05-17 NOTE — Progress Notes (Signed)
CC: T2DM  HPI:  [name] is a 55 y.o. female with PMH as listed below who presents for follow upmanagement of her T2DM, HTN, and chronic pain.  T2DM: Patient prescribed Lantus 20 units qhs, Novolog 10 units TIDAC, and for the last month has obtained Januvia 100 mg daily. Patient reports adherence to Lantus 20 units qhs and Januvia daily. She says she ran out of her Novolog about 2 months ago which has not been able to be filled through the [facility] program for some reason. She does report checking her blood sugar twice daily at home with most numbers between 120-235. She reports having occasional lows around 65 with symptoms of sweats and lightheadedness which she corrects with sugar pills or food. Her Hgb A1c improved from 11.0 to 8.0 on 04/03/16.  Chronic Pain: Patient with difficult to control chronic pain secondary to fibromyalgia, MSK joint pain, and L4-5 disc protrusion. She sees Sports Medicine for her MSK pain, mainly adhesive capsulitis and small rotator cuff tear of her left shoulder with plans for steroid injection in about a week. She currently has a pain contract with [name] and takes: -Norco 10-325 mg 1 tablet TID as needed #90/month -Tramadol 50 mg 2 tablets q8h as needed #180/month She has tried Icyhot, Aspercreme, Voltaren with short term relief. She has been prescribed a short course of Naproxen by Sports Medicine and has also been taking Aleve without much relief of her left shoulder pain. She does report some stomach irritation recently. Her pain interferes with her ability to sleep at night. She does also complain of nighttime cramping pain in her legs that does not occur during the day or with ambulation. She has been prescribed Lyrica in the past for her fibrolmyalgia however never took it due to costs. She says she did receive Lyrica prior to surgeries which helped with her fibromyalgia pain.  HTN: BP 138/76. She is not on oral antihypertensives. She does report  decreased activity due to pain and some dietary indiscretion.  Preventative healthcare: Flu shot given today.  Past Medical History:  Diagnosis Date  . 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 Arthritis   . Diabetes mellitus    oral tx  . GERD (gastroesophageal reflux disease)   . History of post-sterilization tuboplasty 2000  . Plantar fasciitis    Right  . Shortness of breath    with exertion  . Sleep apnea 5 plus yrs   study -pt could not sleep test inconclusive.   . Tear of meniscus of left knee    x2  . Tear of meniscus of right knee     Review of Systems:   Review of Systems  Respiratory: Negative for cough, hemoptysis and shortness of breath.   Cardiovascular: Negative for chest pain.  Gastrointestinal: Positive for heartburn. Negative for abdominal pain, blood in stool, constipation, diarrhea, melena, nausea and vomiting.  Genitourinary: Negative for hematuria.  Musculoskeletal: Positive for back pain, joint pain and myalgias. Negative for falls.  Neurological: Negative for dizziness, sensory change and loss of consciousness.     Physical Exam:  Vitals:   05/17/16 1605  BP: 138/76  Pulse: 95  Temp: 98 F (36.7 C)  TempSrc: Oral  Weight: 220 lb 6.4 oz (100 kg)   Physical Exam  Constitutional: She is oriented to person, place, and time. She appears well-developed and well-nourished. No distress.  Cardiovascular: Normal rate and regular rhythm.   No murmur heard.  Pulmonary/Chest: Effort normal. No respiratory distress. She has no wheezes. She has no rales.  Musculoskeletal:  Tenderness over left AC joint, unable to lift left arm above 90 degrees due to pain, ROM intact on right side. Mild tenderness to palpation over the lumbar spinous processes.  Neurological: She is alert and oriented to person, place, and time.  Skin: Skin is warm. She is not diaphoretic. No erythema.    Assessment & Plan:   See  Encounters Tab for problem based charting.  Patient discussed with Dr. Beryle Beams

## 2016-05-18 NOTE — Progress Notes (Signed)
Medicine attending: Medical history, presenting problems, physical findings, and medications, reviewed with resident physician Dr Vishal Patel on the day of the patient visit and I concur with his evaluation and management plan. 

## 2016-05-18 NOTE — Assessment & Plan Note (Signed)
Flu shot given today

## 2016-05-18 NOTE — Assessment & Plan Note (Signed)
Fibromyalgia pain mildly controlled with Tramadol. She has finally plugged in with [facility] for medication assistance. Will try Lyrica 75 mg po BID for her fibromyalgia pain. This may also help with her nocturnal leg cramps. If providing relief, may be able to begin weaning her off Tramadol. Would consider Cymbalta as an alternate option.

## 2016-05-18 NOTE — Assessment & Plan Note (Signed)
BP 138/76. She is not on oral antihypertensives. She does report decreased activity due to pain and some dietary indiscretion.  BP well controlled, continue diet and exercise as tolerated.

## 2016-05-18 NOTE — Assessment & Plan Note (Signed)
Patient with difficult to control chronic pain secondary to fibromyalgia, MSK joint pain, and L4-5 disc protrusion. She sees Sports Medicine for her MSK pain, mainly adhesive capsulitis and small rotator cuff tear of her left shoulder with plans for steroid injection in about a week. She currently has a pain contract with [name] and takes: -Norco 10-325 mg 1 tablet TID as needed #90/month -Tramadol 50 mg 2 tablets q8h as needed #180/month She has tried Icyhot, Aspercreme, Voltaren with short term relief. She has been prescribed a short course of Naproxen by Sports Medicine and has also been taking Aleve without much relief of her left shoulder pain. She does report some stomach irritation recently. Her pain interferes with her ability to sleep at night. She does also complain of nighttime cramping pain in her legs that does not occur during the day or with ambulation. She has been prescribed Lyrica in the past for her fibrolmyalgia however never took it due to costs. She says she did receive Lyrica prior to surgeries which helped with her fibromyalgia pain.  She is asking about increasing the number of her pain medications temporarily until she is able to have surgery on her left shoulder. I would like to avoid increasing her narcotics as it would make it even more difficult to come down on these high risk medications. -Continue Norco 10-325 mg 1 tablet TID as needed #90/month -Continue Tramadol 50 mg 2 tablets q8h as needed #180/month -Add Lidoderm patch for pain at night -Start Lyrica 75 mg po BID for fibromyalgia; if helpful, may be able to come down on the Tramadol -Adivsed patient to hold off on NSAIDs as these are likely causing her stomach irritation; she has no signs of bleeding -f/u with Sports Medicine for steroid injection

## 2016-05-18 NOTE — Assessment & Plan Note (Signed)
Patient prescribed Lantus 20 units qhs, Novolog 10 units TIDAC, and for the last month has obtained Januvia 100 mg daily. Patient reports adherence to Lantus 20 units qhs and Januvia daily. She says she ran out of her Novolog about 2 months ago which has not been able to be filled through the [facility] program for some reason. She does report checking her blood sugar twice daily at home with most numbers between 120-235. She reports having occasional lows around 65 with symptoms of sweats and lightheadedness which she corrects with sugar pills or food. Her Hgb A1c improved from 11.0 to 8.0 on 04/03/16.  Diabetic control does seem improved with last A1c and per patient's report of home blood sugars even while off home Novolog. She has finally been able to start Januvia which is adding benefit. I am concerned about her low blood sugars and am hesitant to resume mealtime insulin at this time however she is at risk for worsened glycemic control. I will continue her current Lantus and Januvia for now and recheck her Hgb A1c next month and see how her home blood sugars are. She did not bring her meter today. -D/c mealtime Novolog -Continue Lantus 20 units qhs -Continue Januvia 100 mg daily -f/u next month for Hgb A1c

## 2016-05-21 ENCOUNTER — Ambulatory Visit: Payer: No Typology Code available for payment source | Attending: Family Medicine | Admitting: Physical Therapy

## 2016-05-21 DIAGNOSIS — M25512 Pain in left shoulder: Secondary | ICD-10-CM | POA: Insufficient documentation

## 2016-05-21 DIAGNOSIS — M6281 Muscle weakness (generalized): Secondary | ICD-10-CM | POA: Insufficient documentation

## 2016-05-21 DIAGNOSIS — M25612 Stiffness of left shoulder, not elsewhere classified: Secondary | ICD-10-CM

## 2016-05-21 NOTE — Therapy (Signed)
[place], [place], [address] Phone: [phone]   Fax:  [phone]  Physical Therapy Treatment  Patient Details  Name: [name] MRN: [id number] Date of Birth: 1960/10/19 Referring Provider: Dava Najjar   Encounter Date: 05/21/2016      PT End of Session - 05/21/16 1709    Visit Number 4   Number of Visits 16   Date for PT Re-Evaluation 06/27/16   Authorization Type CAFA 05/25/2016   PT Start Time 1415   PT Stop Time 1501   PT Time Calculation (min) 46 min   Activity Tolerance Patient tolerated treatment well   Behavior During Therapy [facility] for tasks assessed/performed      Past Medical History:  Diagnosis Date  . 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 Arthritis   . Diabetes mellitus    oral tx  . GERD (gastroesophageal reflux disease)   . History of post-sterilization tuboplasty 2000  . Plantar fasciitis    Right  . Shortness of breath    with exertion  . Sleep apnea 5 plus yrs   study -pt could not sleep test inconclusive.   . Tear of meniscus of left knee    x2  . Tear of meniscus of right knee     Past Surgical History:  Procedure Laterality Date  . CHOLECYSTECTOMY    . FOOT TENDON SURGERY    . KNEE ARTHROSCOPY  10/10/2011   Procedure: ARTHROSCOPY KNEE;  Surgeon: Newt Minion, MD;  Location: [facility];  Service: Orthopedics;  Laterality: Right;  Right Knee Arthroscopy and Excision Medial Meniscus  . MENISCUS REPAIR     R and L (L x2)  . TONSILLECTOMY    . TUBAL LIGATION      There were no vitals filed for this visit.      Subjective Assessment - 05/21/16 1415    Subjective Patient reports she had a lot of pain on Sunday. She reports she has been resting the past few days but she continues to have 5-6/10 pain. Her pain is worse with activity. She feels like it is getting looser. She is having a shot on Wednesday.     Pertinent History R hand surgery. RTC tear on the left    Limitations House hold activities;Lifting   How long can you sit comfortably? N/A   How long can you stand comfortably? N/A    How long can you walk comfortably? N/A    Diagnostic tests MRI    Patient Stated Goals a partical thickness tear of the supraspinatus as well as mild degeneration of the biceps tnedon and moderate AC joint arthritis    Currently in Pain? Yes   Pain Score 5    Pain Location Shoulder   Pain Orientation Left;Anterior   Pain Descriptors / Indicators Aching;Sharp   Pain Type Chronic pain   Pain Onset More than a month ago   Pain Frequency Constant   Aggravating Factors  using left arm    Pain Relieving Factors rest, pain meds    Effect of Pain on Daily Activities cant sleep                                  PT Education - 05/21/16 1708    Education provided Yes   Education Details continue with light exercises    Person(s) Educated  Patient   Methods Explanation   Comprehension Verbalized understanding;Returned demonstration;Verbal cues required          PT Short Term Goals - 05/21/16 1711      PT SHORT TERM GOAL #1   Title Patient will increase left  flexion and abduction by 25 degrees    Time 3   Period Weeks   Status On-going     PT SHORT TERM GOAL #2   Title Patient will increase left  passive ER by 20 degrees    Time 3   Period Weeks   Status On-going     PT SHORT TERM GOAL #3   Title Patient will be independent with stretching and scapualr strengthening program    Time 3   Period Weeks   Status On-going           PT Long Term Goals - 05/02/16 1715      PT LONG TERM GOAL #1   Title Patient will increase gross left shoulder strength to 4/5 in order to perfrom ADL's    Time 6   Period Weeks   Status New     PT LONG TERM GOAL #2   Title Patient will reach behind her head to do her hair without significant pain    Time 3   Period Weeks   Status New      PT LONG TERM GOAL #3   Title Patient will increase gross left range of motion by 30% in order to optimize her surgical outcome.    Time 3   Period Weeks   Status New               Plan - 05/21/16 1710    Clinical Impression Statement Inmproved tolerance to activity today. She seems to be responding well to the ultrasound. She reports she has been doing her exercises despite PT advising her to hold. Therapy reviewed her exercises to make sure she is doing them right.     Rehab Potential Good   PT Frequency 2x / week   PT Duration 8 weeks   PT Treatment/Interventions ADLs/Self Care Home Management;Cryotherapy;Electrical Stimulation;Iontophoresis 4mg /ml Dexamethasone;Moist Heat;Traction;Ultrasound;Stair training;Gait training;Functional mobility training;Patient/family education;Neuromuscular re-education;Therapeutic exercise;Therapeutic activities;Manual techniques;Passive range of motion;Dry needling;Taping;Splinting   PT Next Visit Plan Continue with low grade shoulder mobilizations for pain and mobility. Consider light scpaular strengthening in pain free ranges.    PT Home Exercise Plan wand ER, IR and flexion, pendulums for pain and motion    Consulted and Agree with Plan of Care Patient      Patient will benefit from skilled therapeutic intervention in order to improve the following deficits and impairments:  Decreased activity tolerance, Pain, Impaired UE functional use, Decreased strength, Decreased mobility, Decreased range of motion, Difficulty walking  Visit Diagnosis: Acute pain of left shoulder  Stiffness of left shoulder, not elsewhere classified  Muscle weakness (generalized)     Problem List Patient Active Problem List   Diagnosis Date Noted  . Pain in joint, shoulder region 03/12/2016  . Chronic narcotic dependence ([place]) 03/01/2016  . Hot flashes 12/29/2015  . Rotator cuff syndrome of left shoulder 12/09/2015  . Rotator cuff syndrome of left shoulder  12/09/2015  . Vaginal candidiasis 12/02/2015  . Family history of breast cancer 11/29/2015  . Acromioclavicular joint arthritis 09/19/2015  . Itching 03/23/2015  . Essential hypertension 12/17/2014  . Rash and nonspecific skin eruption 01/18/2014  . Sesamoiditis thumb IP joint 12/11/2013  . Chronic back pain 06/15/2013  .  Tenosynovitis of hand or wrist 03/13/2013  . Dysmenorrhea 12/02/2012  . Insomnia 12/02/2012  . Fibromyalgia 05/23/2012  . Preventative health care 03/01/2011  . HYPERLIPIDEMIA 07/27/2010  . Type 2 diabetes mellitus, uncontrolled ([place]) 07/26/2010  . DUPUYTREN'S CONTRACTURE, RIGHT 10/19/2009  . Plantar fascial fibromatosis 09/06/2006  . ADHESIVE CAPSULITIS, RIGHT 09/04/2006  . Other tear of cartilage or meniscus of knee, current 09/04/2006    Carney Living PT DPT  05/21/2016, 5:12 PM  [facility] [address] [place], [place], [address] Phone: [phone]   Fax:  [phone]  Name: [name] MRN: [id number] Date of Birth: 1961-04-15

## 2016-05-23 ENCOUNTER — Telehealth: Payer: Self-pay | Admitting: Internal Medicine

## 2016-05-23 ENCOUNTER — Ambulatory Visit: Payer: No Typology Code available for payment source | Admitting: Physical Therapy

## 2016-05-23 NOTE — Telephone Encounter (Signed)
APT. REMINDER CALL, LMTCB °

## 2016-05-24 ENCOUNTER — Ambulatory Visit: Payer: No Typology Code available for payment source

## 2016-05-25 MED FILL — HYDROCODON-APAP 10-325: 10-325 | 30 days supply | Qty: 90 | Fill #0

## 2016-05-25 MED FILL — traMADol HCL 50 MG TABS: 50 | 30 days supply | Qty: 180 | Fill #0

## 2016-05-28 ENCOUNTER — Ambulatory Visit: Payer: Self-pay

## 2016-05-28 ENCOUNTER — Encounter: Payer: Self-pay | Admitting: Physical Therapy

## 2016-05-28 ENCOUNTER — Ambulatory Visit: Payer: No Typology Code available for payment source | Admitting: Physical Therapy

## 2016-05-28 DIAGNOSIS — M25512 Pain in left shoulder: Secondary | ICD-10-CM

## 2016-05-28 DIAGNOSIS — M25612 Stiffness of left shoulder, not elsewhere classified: Secondary | ICD-10-CM

## 2016-05-28 DIAGNOSIS — M6281 Muscle weakness (generalized): Secondary | ICD-10-CM

## 2016-05-28 NOTE — Therapy (Signed)
[facility] [address] [place], [place], [address] Phone: [phone]   Fax:  [phone]  Physical Therapy Treatment  Patient Details  Name: [name] MRN: [id number] Date of Birth: 1960/11/17 Referring Provider: Mosetta Pigeon   Encounter Date: 05/28/2016      PT End of Session - 05/28/16 1422    Visit Number 5   Number of Visits 16   Authorization Type CAFA 05/25/2016   PT Start Time 1419   PT Stop Time 1456   PT Time Calculation (min) 37 min   Activity Tolerance Patient tolerated treatment well   Behavior During Therapy [facility] for tasks assessed/performed      Past Medical History:  Diagnosis Date  . 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 Arthritis   . Diabetes mellitus    oral tx  . GERD (gastroesophageal reflux disease)   . History of post-sterilization tuboplasty 2000  . Plantar fasciitis    Right  . Shortness of breath    with exertion  . Sleep apnea 5 plus yrs   study -pt could not sleep test inconclusive.   . Tear of meniscus of left knee    x2  . Tear of meniscus of right knee     Past Surgical History:  Procedure Laterality Date  . CHOLECYSTECTOMY    . FOOT TENDON SURGERY    . KNEE ARTHROSCOPY  10/10/2011   Procedure: ARTHROSCOPY KNEE;  Surgeon: Nadara Mustard, MD;  Location: [facility];  Service: Orthopedics;  Laterality: Right;  Right Knee Arthroscopy and Excision Medial Meniscus  . MENISCUS REPAIR     R and L (L x2)  . TONSILLECTOMY    . TUBAL LIGATION      There were no vitals filed for this visit.      Subjective Assessment - 05/28/16 1419    Subjective Pt reports she is having a hard time sleeping at night. Had to fix vehicle this weekend that broke down and resulted in severe pain.    Currently in Pain? Yes   Pain Score 6    Pain Location Neck   Pain Orientation Left                         [facility] Adult PT  Treatment/Exercise - 05/28/16 0001      Shoulder Exercises: Pulleys   Flexion 3 minutes;2 minutes     Ultrasound   Ultrasound Location L shoulder   Ultrasound Parameters 1.5 w/cm2 cont   Ultrasound Goals Pain     Iontophoresis   Type of Iontophoresis Dexamethasone   Location left shoulder    Dose 1cc    Time long release patch      Manual Therapy   Manual therapy comments gentle ROM within tol, gr 1&2 , soft tissue L upper trap and scalenes                  PT Short Term Goals - 05/21/16 1711      PT SHORT TERM GOAL #1   Title Patient will increase left  flexion and abduction by 25 degrees    Time 3   Period Weeks   Status On-going     PT SHORT TERM GOAL #2   Title Patient will increase left  passive ER by 20 degrees    Time 3   Period Weeks   Status On-going  PT SHORT TERM GOAL #3   Title Patient will be independent with stretching and scapualr strengthening program    Time 3   Period Weeks   Status On-going           PT Long Term Goals - 05/02/16 1715      PT LONG TERM GOAL #1   Title Patient will increase gross left shoulder strength to 4/5 in order to perfrom ADL's    Time 6   Period Weeks   Status New     PT LONG TERM GOAL #2   Title Patient will reach behind her head to do her hair without significant pain    Time 3   Period Weeks   Status New     PT LONG TERM GOAL #3   Title Patient will increase gross left range of motion by 30% in order to optimize her surgical outcome.    Time 3   Period Weeks   Status New               Plan - 05/28/16 1456    Clinical Impression Statement verbalized improved pain following treatment today. Good overhead ROM noted in flexion, below 90 available in abd passively   PT Next Visit Plan Continue with low grade shoulder mobilizations for pain and mobility. Consider light scpaular strengthening in pain free ranges.    PT Home Exercise Plan wand ER, IR and flexion, pendulums for pain and  motion    Consulted and Agree with Plan of Care Patient      Patient will benefit from skilled therapeutic intervention in order to improve the following deficits and impairments:     Visit Diagnosis: Acute pain of left shoulder  Stiffness of left shoulder, not elsewhere classified  Muscle weakness (generalized)     Problem List Patient Active Problem List   Diagnosis Date Noted  . Pain in joint, shoulder region 03/12/2016  . Chronic narcotic dependence (HCC) 03/01/2016  . Hot flashes 12/29/2015  . Rotator cuff syndrome of left shoulder 12/09/2015  . Rotator cuff syndrome of left shoulder 12/09/2015  . Vaginal candidiasis 12/02/2015  . Family history of breast cancer 11/29/2015  . Acromioclavicular joint arthritis 09/19/2015  . Itching 03/23/2015  . Essential hypertension 12/17/2014  . Rash and nonspecific skin eruption 01/18/2014  . Sesamoiditis thumb IP joint 12/11/2013  . Chronic back pain 06/15/2013  . Tenosynovitis of hand or wrist 03/13/2013  . Dysmenorrhea 12/02/2012  . Insomnia 12/02/2012  . Fibromyalgia 05/23/2012  . Preventative health care 03/01/2011  . HYPERLIPIDEMIA 07/27/2010  . Type 2 diabetes mellitus, uncontrolled (HCC) 07/26/2010  . DUPUYTREN'S CONTRACTURE, RIGHT 10/19/2009  . Plantar fascial fibromatosis 09/06/2006  . ADHESIVE CAPSULITIS, RIGHT 09/04/2006  . Other tear of cartilage or meniscus of knee, current 09/04/2006   Tyan Dy C. Palmina Clodfelter PT, DPT 05/28/16 3:01 PM   [facility] [address] [place], [place], [address] Phone: [phone]   Fax:  [phone]  Name: [name] MRN: [id number] Date of Birth: June 18, 1961

## 2016-05-30 ENCOUNTER — Ambulatory Visit: Payer: No Typology Code available for payment source | Admitting: Physical Therapy

## 2016-05-30 ENCOUNTER — Ambulatory Visit: Payer: Self-pay

## 2016-05-31 ENCOUNTER — Telehealth: Payer: Self-pay | Admitting: Internal Medicine

## 2016-05-31 NOTE — Telephone Encounter (Signed)
APT. REMINDER CALL, LMTCB °

## 2016-06-01 ENCOUNTER — Ambulatory Visit: Payer: Self-pay

## 2016-06-04 ENCOUNTER — Ambulatory Visit: Payer: No Typology Code available for payment source | Admitting: Physical Therapy

## 2016-06-04 ENCOUNTER — Encounter: Payer: Self-pay | Admitting: Physical Therapy

## 2016-06-04 DIAGNOSIS — M25512 Pain in left shoulder: Secondary | ICD-10-CM

## 2016-06-04 DIAGNOSIS — M6281 Muscle weakness (generalized): Secondary | ICD-10-CM

## 2016-06-04 DIAGNOSIS — M25612 Stiffness of left shoulder, not elsewhere classified: Secondary | ICD-10-CM

## 2016-06-04 NOTE — Therapy (Signed)
[place], [place], [address] Phone: [phone]   Fax:  [phone]  Physical Therapy Treatment  Patient Details  Name: [name] MRN: [id number] Date of Birth: [birth year] Referring Provider: Dava Najjar   Encounter Date: 06/04/2016      PT End of Session - 06/04/16 1426    Visit Number 6   Number of Visits 16   Date for PT Re-Evaluation 06/27/16   Authorization Type CAFA 05/25/2016   PT Start Time 1417   PT Stop Time 1500   PT Time Calculation (min) 43 min   Activity Tolerance Patient tolerated treatment well   Behavior During Therapy [facility] for tasks assessed/performed      Past Medical History:  Diagnosis Date  . 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 Arthritis   . Diabetes mellitus    oral tx  . GERD (gastroesophageal reflux disease)   . History of post-sterilization tuboplasty 2000  . Plantar fasciitis    Right  . Shortness of breath    with exertion  . Sleep apnea 5 plus yrs   study -pt could not sleep test inconclusive.   . Tear of meniscus of left knee    x2  . Tear of meniscus of right knee     Past Surgical History:  Procedure Laterality Date  . CHOLECYSTECTOMY    . FOOT TENDON SURGERY    . KNEE ARTHROSCOPY  10/10/2011   Procedure: ARTHROSCOPY KNEE;  Surgeon: Newt Minion, MD;  Location: [facility];  Service: Orthopedics;  Laterality: Right;  Right Knee Arthroscopy and Excision Medial Meniscus  . MENISCUS REPAIR     R and L (L x2)  . TONSILLECTOMY    . TUBAL LIGATION      There were no vitals filed for this visit.      Subjective Assessment - 06/04/16 1421    Subjective Patient reports her pain is becoming more constant. She reports the pain is getting worse. The pain is the worst at night. She is having less functional use of her arm.    Pertinent History R hand surgery. RTC tear on the left    Limitations House  hold activities;Lifting   How long can you sit comfortably? N/A   How long can you stand comfortably? N/A    How long can you walk comfortably? N/A    Diagnostic tests MRI    Patient Stated Goals a partical thickness tear of the supraspinatus as well as mild degeneration of the biceps tnedon and moderate AC joint arthritis    Currently in Pain? Yes   Pain Score 7    Pain Location Shoulder   Pain Orientation Left   Pain Descriptors / Indicators Aching;Sharp   Pain Type Chronic pain   Pain Onset More than a month ago   Pain Frequency Constant   Aggravating Factors  using the left arm    Pain Relieving Factors rest, pain meds    Effect of Pain on Daily Activities cant sleep             [facility] PT Assessment - 06/04/16 0001      AROM   Overall AROM Comments Active Shoulder flexion 84 degrees      PROM   Left Shoulder Flexion 138 Degrees   Left Shoulder ABduction 80 Degrees   Left Shoulder Internal Rotation 43 Degrees   Left Shoulder External Rotation 15 Degrees  Strength   Left Shoulder Flexion 4/5   Left Shoulder ABduction 3/5   Left Shoulder External Rotation 4/5     Palpation   Palpation comment tenderness to palpation in the anterior shoulder.                      [place] Adult PT Treatment/Exercise - 06/04/16 0001      Shoulder Exercises: Pulleys   Flexion 3 minutes;2 minutes     Modalities   Modalities Cryotherapy     Cryotherapy   Number Minutes Cryotherapy 10 Minutes   Cryotherapy Location Shoulder   Type of Cryotherapy Ice pack     Manual Therapy   Manual therapy comments gentle ROM within tol, gr 1&2 , soft tissue L upper trap and scalenes                PT Education - 06/04/16 1425    Education provided Yes   Education Details continue with range of motion exercises   Person(s) Educated Patient   Methods Explanation   Comprehension Verbalized understanding;Returned demonstration;Verbal cues required          PT Short  Term Goals - 06/04/16 1441      PT SHORT TERM GOAL #1   Title Patient will increase left  flexion and abduction by 25 degrees    Baseline No change in flexion    Time 3   Period Weeks   Status Not Met     PT SHORT TERM GOAL #2   Title Patient will increase left  passive ER by 20 degrees    Baseline No change in ER    Time 3   Period Weeks   Status Not Met     PT SHORT TERM GOAL #3   Title Patient will be independent with stretching and scapualr strengthening program    Baseline Difficulty perfroming exercises    Time 3   Period Weeks   Status Not Met           PT Long Term Goals - 06/04/16 1442      PT LONG TERM GOAL #1   Title Patient will increase gross left shoulder strength to 4/5 in order to perfrom ADL's    Baseline Slight improvement                Plan - 06/04/16 1435    Clinical Impression Statement Paitent continues to be limited. She reports all exercises and all stretches make it worse. Therapy has tried modalities which have not helped. At this time she has not had any benefit from therapy after 6 visits. She reports after therapy she can not get out of bed for 2 days. Therapy is only perfroming light manual stretching and AAROM activity such as pulleys and wand exercises.  She will be discharged to light stretching program at home. She has not reached any goals for therapy. Limited charges for visit 2nd to increased pain with light activity.    Rehab Potential Good   PT Frequency 2x / week   PT Duration 8 weeks   PT Treatment/Interventions ADLs/Self Care Home Management;Cryotherapy;Electrical Stimulation;Iontophoresis 80m/ml Dexamethasone;Moist Heat;Traction;Ultrasound;Stair training;Gait training;Functional mobility training;Patient/family education;Neuromuscular re-education;Therapeutic exercise;Therapeutic activities;Manual techniques;Passive range of motion;Dry needling;Taping;Splinting   PT Next Visit Plan Continue with low grade shoulder  mobilizations for pain and mobility. Consider light scpaular strengthening in pain free ranges.    PT Home Exercise Plan wand ER, IR and flexion, pendulums for pain and motion  Consulted and Agree with Plan of Care Patient      Patient will benefit from skilled therapeutic intervention in order to improve the following deficits and impairments:  Decreased activity tolerance, Pain, Impaired UE functional use, Decreased strength, Decreased mobility, Decreased range of motion, Difficulty walking  Visit Diagnosis: Acute pain of left shoulder  Stiffness of left shoulder, not elsewhere classified  Muscle weakness (generalized)     Problem List Patient Active Problem List   Diagnosis Date Noted  . Pain in joint, shoulder region 03/12/2016  . Chronic narcotic dependence ([place]) 03/01/2016  . Hot flashes 12/29/2015  . Rotator cuff syndrome of left shoulder 12/09/2015  . Rotator cuff syndrome of left shoulder 12/09/2015  . Vaginal candidiasis 12/02/2015  . Family history of breast cancer 11/29/2015  . Acromioclavicular joint arthritis 09/19/2015  . Itching 03/23/2015  . Essential hypertension 12/17/2014  . Rash and nonspecific skin eruption 01/18/2014  . Sesamoiditis thumb IP joint 12/11/2013  . Chronic back pain 06/15/2013  . Tenosynovitis of hand or wrist 03/13/2013  . Dysmenorrhea 12/02/2012  . Insomnia 12/02/2012  . Fibromyalgia 05/23/2012  . Preventative health care 03/01/2011  . HYPERLIPIDEMIA 07/27/2010  . Type 2 diabetes mellitus, uncontrolled ([place]) 07/26/2010  . DUPUYTREN'S CONTRACTURE, RIGHT 10/19/2009  . Plantar fascial fibromatosis 09/06/2006  . ADHESIVE CAPSULITIS, RIGHT 09/04/2006  . Other tear of cartilage or meniscus of knee, current 09/04/2006    Carney Living  PT DPT  06/04/2016, 2:54 PM  [facility] [address] [place], [place], [address] Phone: [phone]   Fax:  [phone]  Name: Danne Scardina MRN: [id number] Date of Birth: 02-14-1961

## 2016-06-06 ENCOUNTER — Ambulatory Visit: Payer: No Typology Code available for payment source | Admitting: Physical Therapy

## 2016-06-12 ENCOUNTER — Telehealth: Payer: Self-pay | Admitting: Internal Medicine

## 2016-06-12 DIAGNOSIS — E1310 Other specified diabetes mellitus with ketoacidosis without coma: Secondary | ICD-10-CM

## 2016-06-12 NOTE — Telephone Encounter (Signed)
Patient would like a call back about having her A1C drawn early per her Sports Medicine Sr.

## 2016-06-12 NOTE — Telephone Encounter (Signed)
Pt calls and needs A1C drawn @ 0900 wed 11/29, please place order asap Thank you

## 2016-06-13 ENCOUNTER — Other Ambulatory Visit (INDEPENDENT_AMBULATORY_CARE_PROVIDER_SITE_OTHER): Payer: Self-pay

## 2016-06-13 DIAGNOSIS — E1165 Type 2 diabetes mellitus with hyperglycemia: Secondary | ICD-10-CM

## 2016-06-13 DIAGNOSIS — E1310 Other specified diabetes mellitus with ketoacidosis without coma: Secondary | ICD-10-CM

## 2016-06-13 LAB — POCT GLYCOSYLATED HEMOGLOBIN (HGB A1C): Hemoglobin A1C: 8.3

## 2016-06-13 LAB — GLUCOSE, CAPILLARY: Glucose-Capillary: 136 mg/dL — ABNORMAL HIGH (ref 65–99)

## 2016-06-20 ENCOUNTER — Encounter: Payer: Self-pay | Admitting: Internal Medicine

## 2016-06-25 ENCOUNTER — Encounter: Payer: Self-pay | Admitting: Internal Medicine

## 2016-06-25 ENCOUNTER — Ambulatory Visit (INDEPENDENT_AMBULATORY_CARE_PROVIDER_SITE_OTHER): Payer: Self-pay | Admitting: Internal Medicine

## 2016-06-25 VITALS — BP 153/78 | HR 86 | Temp 97.9°F | Wt 216.8 lb

## 2016-06-25 DIAGNOSIS — I10 Essential (primary) hypertension: Secondary | ICD-10-CM

## 2016-06-25 DIAGNOSIS — E1165 Type 2 diabetes mellitus with hyperglycemia: Secondary | ICD-10-CM

## 2016-06-25 DIAGNOSIS — M797 Fibromyalgia: Secondary | ICD-10-CM

## 2016-06-25 DIAGNOSIS — E118 Type 2 diabetes mellitus with unspecified complications: Secondary | ICD-10-CM

## 2016-06-25 DIAGNOSIS — IMO0002 Reserved for concepts with insufficient information to code with codable children: Secondary | ICD-10-CM

## 2016-06-25 DIAGNOSIS — Z794 Long term (current) use of insulin: Secondary | ICD-10-CM

## 2016-06-25 DIAGNOSIS — E1310 Other specified diabetes mellitus with ketoacidosis without coma: Secondary | ICD-10-CM

## 2016-06-25 DIAGNOSIS — Z0000 Encounter for general adult medical examination without abnormal findings: Secondary | ICD-10-CM

## 2016-06-25 LAB — GLUCOSE, CAPILLARY: Glucose-Capillary: 279 mg/dL — ABNORMAL HIGH (ref 65–99)

## 2016-06-25 MED ORDER — SITAGLIPTIN PHOSPHATE 100 MG PO TABS: 100.0000 mg | ORAL_TABLET | Freq: Every day | ORAL | 3 refills | Status: DC

## 2016-06-25 MED ORDER — LISINOPRIL 10 MG PO TABS: 10.0000 mg | ORAL_TABLET | Freq: Every day | ORAL | 2 refills | Status: DC

## 2016-06-25 MED FILL — LISINOPRIL 10 MG TABLET: 10 | 30 days supply | Qty: 30 | Fill #0

## 2016-06-25 MED FILL — traMADol HCL 50 MG TABS: 50 | 30 days supply | Qty: 180 | Fill #0

## 2016-06-25 MED FILL — HYDROCODON-APAP 10-325: 10-325 | 30 days supply | Qty: 90 | Fill #0

## 2016-06-25 NOTE — Assessment & Plan Note (Addendum)
Last Hgb A1c was 8.3 on 06/13/16. She takes Lantus 20 units qhs and Januvia 100 mg daily (ran out of Januvia 3 weeks ago). She did not bring her meter today, but says she has noticed her blood sugars are increasing (~200 am, 280-400 pm). CBG today is 279. She does report dietary indiscretion, eating a chili dog just before her appointment. She was taken off her Novolog on last visit as she said she ran out several months ago and reported hypoglycemic episodes at home with improvement in A1c from 11.0 to 8.0.  Per patient report, home blood sugars are rising unsurprisingly while she has been off of Januvia. As always, consistent medication adherence and diet are the main issues in controlling her blood sugars. [facility] who will deliver her Januvia in the next 2 weeks. Will send an Rx to the [facility] as she says she can afford the $4 cost for IM program patients. -Continue Lantus 20 units qhs -Continue Januvia 100 mg daily; Rx sent to [facility] outpatient pharmacy -Check urine microalbumin (starting ACE-I for HTN as well) -Refer for diabetic/nutrition counseling

## 2016-06-25 NOTE — Progress Notes (Signed)
   CC: T2DM  HPI:  [name] is a 55 y.o. female with PMH of T2DM, chronic pain, HTN, and fibromyalgia who presents for follow up management of her T2DM, HTN, and Fibromyalgia.  T2DM: Last Hgb A1c was 8.3 on 06/13/16. She takes Lantus 20 units qhs and Januvia 100 mg daily (ran out of Januvia 3 weeks ago). She did not bring her meter today, but says she has noticed her blood sugars are increasing (~200 am, 280-400 pm). CBG today is 279. She does report dietary indiscretion, eating a chili dog just before her appointment. She was taken off her Novolog on last visit as she said she ran out several months ago and reported hypoglycemic episodes at home with improvement in A1c from 11.0 to 8.0.   Fibromyalgia: Patient's fibromyalgia mildly controlled with Tramadol 100 mg q8h. We tried starting Lyrica 75 mg BID on last visit, but this is apparently not available at the [place] program, so she has not taken Lyrica yet. She reports trying Cymbalta and Gabapentin in the past without relief.  HTN: BP slightly elevated at 153/78 today. She is not on antihypertensive medication.  Health Maintenance: Patient has not had screening for colorectal cancer. She denies any hematochezia or melena.    Past Medical History:  Diagnosis Date  . 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 Arthritis   . Diabetes mellitus    oral tx  . GERD (gastroesophageal reflux disease)   . History of post-sterilization tuboplasty 2000  . Plantar fasciitis    Right  . Shortness of breath    with exertion  . Sleep apnea 5 plus yrs   study -pt could not sleep test inconclusive.   . Tear of meniscus of left knee    x2  . Tear of meniscus of right knee     Review of Systems:   Review of Systems  Respiratory: Negative for shortness of breath.   Cardiovascular: Negative for chest pain.  Gastrointestinal: Negative for blood in stool and melena.    Musculoskeletal: Positive for back pain and joint pain. Negative for falls.  Neurological: Negative for dizziness and loss of consciousness.     Physical Exam:  Vitals:   06/25/16 1331  BP: (!) 153/78  Pulse: 86  Temp: 97.9 F (36.6 C)  TempSrc: Oral  SpO2: 99%  Weight: 216 lb 12.8 oz (98.3 kg)   Physical Exam  Constitutional: She is oriented to person, place, and time. She appears well-developed and well-nourished. No distress.  Cardiovascular: Normal rate and regular rhythm.   Pulmonary/Chest: Effort normal. No respiratory distress. She has no wheezes. She has no rales.  Abdominal: Soft. There is no tenderness.  Neurological: She is alert and oriented to person, place, and time.  Skin: Skin is warm. She is not diaphoretic.    Assessment & Plan:   See Encounters Tab for problem based charting.  Patient discussed with Dr. Lynnae January

## 2016-06-25 NOTE — Assessment & Plan Note (Signed)
Patient's fibromyalgia mildly controlled with Tramadol 100 mg q8h. We tried starting Lyrica 75 mg BID on last visit, but this is apparently not available at the [place] program, so she has not taken Lyrica yet. She reports trying Cymbalta and Gabapentin in the past without relief.  Will continue current management with Tramadol. She will apply for disability and Medicaid. If she is able to obtain Medicaid, we will continue with the plan to try Lyrica and hopefully decrease Tramadol in the future.

## 2016-06-25 NOTE — Assessment & Plan Note (Addendum)
BP slightly elevated at 153/78 today. She is not on antihypertensive medication.  BP Readings from Last 3 Encounters:  06/25/16 (!) 153/78  05/17/16 138/76  04/03/16 (!) 154/68   Looking back, her BP has been elevated in the [id number] systolic during the last several appointments with her other providers. Will start antihypertensives today with Lisinopril and check baseline labs. -Start Lisinopril 10 mg daily; Rx sent to [facility] outpatient pharmacy -Check BMET

## 2016-06-25 NOTE — Patient Instructions (Addendum)
It was a pleasure to see you again [name].  I will send the Januvia to the [facility]. Please pick this up as soon as it is available.  I am referring you to our Diabetic Counselor and for screening colonoscopy.  I am starting Lisinopril 10 mg daily for high blood pressure.  Please follow up with me in 3 months or sooner if needed.   Diabetes Mellitus and Food It is important for you to manage your blood sugar (glucose) level. Your blood glucose level can be greatly affected by what you eat. Eating healthier foods in the appropriate amounts throughout the day at about the same time each day will help you control your blood glucose level. It can also help slow or prevent worsening of your diabetes mellitus. Healthy eating may even help you improve the level of your blood pressure and reach or maintain a healthy weight. General recommendations for healthful eating and cooking habits include:  Eating meals and snacks regularly. Avoid going long periods of time without eating to lose weight.  Eating a diet that consists mainly of plant-based foods, such as fruits, vegetables, nuts, legumes, and whole grains.  Using low-heat cooking methods, such as baking, instead of high-heat cooking methods, such as deep frying. Work with your dietitian to make sure you understand how to use the Nutrition Facts information on food labels. How can food affect me? Carbohydrates  Carbohydrates affect your blood glucose level more than any other type of food. Your dietitian will help you determine how many carbohydrates to eat at each meal and teach you how to count carbohydrates. Counting carbohydrates is important to keep your blood glucose at a healthy level, especially if you are using insulin or taking certain medicines for diabetes mellitus. Alcohol  Alcohol can cause sudden decreases in blood glucose (hypoglycemia), especially if you use insulin or take certain medicines for  diabetes mellitus. Hypoglycemia can be a life-threatening condition. Symptoms of hypoglycemia (sleepiness, dizziness, and disorientation) are similar to symptoms of having too much alcohol. If your health care provider has given you approval to drink alcohol, do so in moderation and use the following guidelines:  Women should not have more than one drink per day, and men should not have more than two drinks per day. One drink is equal to:  12 oz of beer.  5 oz of wine.  1 oz of hard liquor.  Do not drink on an empty stomach.  Keep yourself hydrated. Have water, diet soda, or unsweetened iced tea.  Regular soda, juice, and other mixers might contain a lot of carbohydrates and should be counted. What foods are not recommended? As you make food choices, it is important to remember that all foods are not the same. Some foods have fewer nutrients per serving than other foods, even though they might have the same number of calories or carbohydrates. It is difficult to get your body what it needs when you eat foods with fewer nutrients. Examples of foods that you should avoid that are high in calories and carbohydrates but low in nutrients include:  Trans fats (most processed foods list trans fats on the Nutrition Facts label).  Regular soda.  Juice.  Candy.  Sweets, such as cake, pie, doughnuts, and cookies.  Fried foods. What foods can I eat? Eat nutrient-rich foods, which will nourish your body and keep you healthy. The food you should eat also will depend on several factors, including:  The calories you need.  The medicines you take.  Your weight.  Your blood glucose level.  Your blood pressure level.  Your cholesterol level. You should eat a variety of foods, including:  Protein.  Lean cuts of meat.  Proteins low in saturated fats, such as fish, egg whites, and beans. Avoid processed meats.  Fruits and vegetables.  Fruits and vegetables that may help control blood  glucose levels, such as apples, mangoes, and yams.  Dairy products.  Choose fat-free or low-fat dairy products, such as milk, yogurt, and cheese.  Grains, bread, pasta, and rice.  Choose whole grain products, such as multigrain bread, whole oats, and brown rice. These foods may help control blood pressure.  Fats.  Foods containing healthful fats, such as nuts, avocado, olive oil, canola oil, and fish. Does everyone with diabetes mellitus have the same meal plan? Because every person with diabetes mellitus is different, there is not one meal plan that works for everyone. It is very important that you meet with a dietitian who will help you create a meal plan that is just right for you. This information is not intended to replace advice given to you by your health care provider. Make sure you discuss any questions you have with your health care provider. Document Released: 03/29/2005 Document Revised: 12/08/2015 Document Reviewed: 05/29/2013 Elsevier Interactive Patient Education  2017 [employer].

## 2016-06-25 NOTE — Assessment & Plan Note (Addendum)
Patient has not had screening for colorectal cancer. She denies any hematochezia or melena.  -Will refer to GI for screening colonoscopy.   10 year ASCVD risk is 3.4%. She was previously on Pravastatin for HLD. Will discuss initiating moderate-intensity statin on follow up.

## 2016-06-26 ENCOUNTER — Encounter: Payer: Self-pay | Admitting: Internal Medicine

## 2016-06-26 LAB — MICROALBUMIN / CREATININE URINE RATIO
Creatinine, Urine: 86.7 mg/dL
Microalb/Creat Ratio: 10 mg/g creat (ref 0.0–30.0)
Microalbumin, Urine: 8.7 ug/mL

## 2016-06-26 LAB — BMP8+ANION GAP
Anion Gap: 15 mmol/L (ref 10.0–18.0)
BUN/Creatinine Ratio: 23 (ref 9–23)
BUN: 15 mg/dL (ref 6–24)
CO2: 22 mmol/L (ref 18–29)
Calcium: 9.5 mg/dL (ref 8.7–10.2)
Chloride: 100 mmol/L (ref 96–106)
Creatinine, Ser: 0.65 mg/dL (ref 0.57–1.00)
GFR calc Af Amer: 117 mL/min/{1.73_m2} (ref >59)
GFR calc non Af Amer: 102 mL/min/{1.73_m2} (ref >59)
Glucose: 271 mg/dL — ABNORMAL HIGH (ref 65–99)
Potassium: 4.3 mmol/L (ref 3.5–5.2)
Sodium: 137 mmol/L (ref 134–144)

## 2016-06-27 ENCOUNTER — Encounter: Payer: Self-pay | Admitting: Internal Medicine

## 2016-06-29 NOTE — Progress Notes (Signed)
Internal Medicine Clinic Attending  Case discussed with Dr. Patel,Vishal at the time of the visit.  We reviewed the resident's history and exam and pertinent patient test results.  I agree with the assessment, diagnosis, and plan of care documented in the resident's note.  

## 2016-07-05 ENCOUNTER — Ambulatory Visit (INDEPENDENT_AMBULATORY_CARE_PROVIDER_SITE_OTHER): Payer: Self-pay | Admitting: Dietician

## 2016-07-05 ENCOUNTER — Encounter (INDEPENDENT_AMBULATORY_CARE_PROVIDER_SITE_OTHER): Payer: Self-pay

## 2016-07-05 DIAGNOSIS — Z794 Long term (current) use of insulin: Secondary | ICD-10-CM

## 2016-07-05 DIAGNOSIS — Z6839 Body mass index (BMI) 39.0-39.9, adult: Secondary | ICD-10-CM

## 2016-07-05 DIAGNOSIS — Z713 Dietary counseling and surveillance: Secondary | ICD-10-CM

## 2016-07-05 DIAGNOSIS — E1310 Other specified diabetes mellitus with ketoacidosis without coma: Secondary | ICD-10-CM

## 2016-07-05 DIAGNOSIS — E1165 Type 2 diabetes mellitus with hyperglycemia: Secondary | ICD-10-CM

## 2016-07-05 NOTE — Patient Instructions (Signed)
Increase your lantus as discussed.   See you in 3 weeks.   Your blood sugar 223, weight was 215# today

## 2016-07-05 NOTE — Progress Notes (Signed)
  Medical Nutrition Therapy:  Appt start time: 1320 end time:  1430. Visit # 1  Assessment:  Primary concerns today: blood sugar control primarily and weight some also.  Ms. Martz is familiar with diabetes care from her family members who also have diabetes. She is aware of options for improving her blood sugar by the time she sees her surgeon who asked her to improve her blood sugars before he does surgery.However, she had not thought through a plan. She asks for help with that plan today.  She is currently making food choice changes and is not ready to make enough change in that area to improve her blood sugar ras desired to an A1C of 7%. She does nt feel increasing her activity is an option at this point.   Preferred Learning Style:No preference indicated  Learning Readiness: Contemplating/Ready/Change in progress  ANTHROPOMETRICS: weight-215#, height-62", BMI-39 WEIGHT HISTORY: 240# in 2008, 200# in 2014, 220#  earlier this year SLEEP:reports she does not sleep well due to shoulder pain Food Intolerances: unknown, Denies Nausea,Vomiting:, Diarrhea: Constipation:  Any hair loss:  Dining Out (times/week): seldom recenlty MEDICATIONS: 20 units lantus nightly except sometimes forgets it and stays overnight, also just restarted her [place] after being off of it for ~ 1 month,  BLOOD SUGAR:223 today and she reports most blood sugar in the past month have been 200s and 300s.  DIETARY INTAKE:. Everyday foods include whole milk.  Avoided foods- skim or lowfat milk.   24-hr recall:  B ( AM): cereal and milk and banana L ( PM): fruit, soup- vegetable with rice, water Snk ( PM):  D ( PM): tostados homemade by her husband, or rice chex with whole milk and drink a glass of whole milk with it.  Snk ( PM): cereal with milk Beverages: powerade, gatorade, water, whole milk  Usual physical activity: limited by pain in foot  Progress Towards Goal(s):  In progress.   Nutritional Diagnosis:  [place]-2.3  Food-medication interaction As related to higher blood sugars that target.  As evidenced by her report of home CBGs and the one here and A1C 8- 8.2%.    Intervention:  Nutrition education about options for improving her blood sugar and healthier carbs to help lower her blood sugar. Per approval by Jeronimo Norma- she was instructed.to increase her lantus by 3 units every 3 days until 2/3 readings are in target of 90-130. She was given a handout to help her record her progress and asked to return with her meter and or this sheet.  Coordination of care: see above  Teaching Method Utilized: Visual.Auditory,Hands on Handouts given during visit include: Carbs, lantus self titration sheet Barriers to learning/adherence to lifestyle change: competing values Demonstrated degree of understanding via:  Teach Back   Monitoring/Evaluation:  Dietary intake, exercise, meter, and body weight in 3 week(s). Natosha Bou, Butch Penny, [place] 07/05/2016 5:24 PM.

## 2016-07-06 ENCOUNTER — Other Ambulatory Visit: Payer: Self-pay | Admitting: Internal Medicine

## 2016-07-06 DIAGNOSIS — E118 Type 2 diabetes mellitus with unspecified complications: Principal | ICD-10-CM

## 2016-07-06 DIAGNOSIS — E1165 Type 2 diabetes mellitus with hyperglycemia: Secondary | ICD-10-CM

## 2016-07-06 MED ORDER — INSULIN GLARGINE 100 UNIT/ML ~~LOC~~ SOLN
SUBCUTANEOUS | 11 refills | Status: DC
Start: 2016-07-06 — End: 2016-07-25

## 2016-07-12 ENCOUNTER — Encounter: Payer: Self-pay | Admitting: Internal Medicine

## 2016-07-12 ENCOUNTER — Other Ambulatory Visit: Payer: Self-pay | Admitting: *Deleted

## 2016-07-12 ENCOUNTER — Ambulatory Visit (INDEPENDENT_AMBULATORY_CARE_PROVIDER_SITE_OTHER): Payer: Self-pay | Admitting: Internal Medicine

## 2016-07-12 VITALS — BP 140/73 | HR 68 | Temp 97.7°F | Ht 61.0 in | Wt 219.0 lb

## 2016-07-12 DIAGNOSIS — R42 Dizziness and giddiness: Secondary | ICD-10-CM

## 2016-07-12 DIAGNOSIS — M549 Dorsalgia, unspecified: Principal | ICD-10-CM

## 2016-07-12 DIAGNOSIS — N920 Excessive and frequent menstruation with regular cycle: Secondary | ICD-10-CM

## 2016-07-12 DIAGNOSIS — N946 Dysmenorrhea, unspecified: Secondary | ICD-10-CM

## 2016-07-12 DIAGNOSIS — G8929 Other chronic pain: Secondary | ICD-10-CM

## 2016-07-12 DIAGNOSIS — Z8049 Family history of malignant neoplasm of other genital organs: Secondary | ICD-10-CM

## 2016-07-12 LAB — CBC
HCT: 39.1 % (ref 36.0–46.0)
Hemoglobin: 13 g/dL (ref 12.0–15.0)
MCH: 29.2 pg (ref 26.0–34.0)
MCHC: 33.2 g/dL (ref 30.0–36.0)
MCV: 87.9 fL (ref 78.0–100.0)
Platelets: 255 10*3/uL (ref 150–400)
RBC: 4.45 MIL/uL (ref 3.87–5.11)
RDW: 12.6 % (ref 11.5–15.5)
WBC: 9.3 10*3/uL (ref 4.0–10.5)

## 2016-07-12 MED ORDER — HYDROCODONE-ACETAMINOPHEN 10-325 MG PO TABS: 1.0000 | ORAL_TABLET | Freq: Three times a day (TID) | ORAL | 0 refills | Status: DC | PRN

## 2016-07-12 MED ORDER — TRAMADOL HCL 50 MG PO TABS: 100.0000 mg | ORAL_TABLET | Freq: Three times a day (TID) | ORAL | 0 refills | Status: DC | PRN

## 2016-07-12 MED ORDER — SODIUM CHLORIDE 0.9 % IV BOLUS (SEPSIS)
500.0000 mL | Freq: Once | INTRAVENOUS | Status: AC
  Administered 2016-07-12: 500 mL via INTRAVENOUS

## 2016-07-12 NOTE — Telephone Encounter (Signed)
PCP appt this month in Dec. Needs appt in March with PCP. Will fill 3 months. UDS 2017 appropriate. Trying to change meds to more appropriate fibromyalgia regimen but monetary issues,

## 2016-07-12 NOTE — Assessment & Plan Note (Signed)
She presented with a 2 day hx of dizziness in the setting of heavy menstrual bleeding. STAT CBC was checked and her hemoglobin was normal at 13. Her orthostatic vitals were positive so she was given a 1 L bolus of NS in the clinic. Despite her hemoglobin being normal, I think her heavy vaginal bleeding is the most likely cause for her dizziness. She has been on a stable dose of her pain medications for quite some time and with no other recent medication changes. She was feeling better by the time she left clinic after the IVFs. I discussed checking a urine pregnancy test with the patient but she has not been sexually active for the last 2.5 years due to her husband being paralyzed. She has been eating and drinking well. Advised her to follow up early next week if she is still symptomatic. Referral placed to OBGYN for her heavy vaginal bleeding.

## 2016-07-12 NOTE — Patient Instructions (Signed)
General Instructions: - Please make an appointment early next week if you are still feeling unwell - Referral to OBGYN for your irregular menstrual bleeding  Please bring your medicines with you each time you come to clinic.  Medicines may include prescription medications, over-the-counter medications, herbal remedies, eye drops, vitamins, or other pills.   Progress Toward Treatment Goals:  Treatment Goal 12/17/2014  Hemoglobin A1C improved    Self Care Goals & Plans:  Self Care Goal 11/22/2015  Manage my medications take my medicines as prescribed; bring my medications to every visit; refill my medications on time  Monitor my health keep track of my blood pressure; bring my glucose meter and log to each visit; keep track of my blood glucose  Eat healthy foods eat more vegetables; eat foods that are low in salt; eat baked foods instead of fried foods  Be physically active find an activity I enjoy  Meeting treatment goals -    Home Blood Glucose Monitoring 12/17/2014  Check my blood sugar 4 times a day  When to check my blood sugar before meals; at bedtime     Care Management & Community Referrals:  Referral 10/22/2014  Referrals made for care management support none needed

## 2016-07-12 NOTE — Progress Notes (Signed)
   CC: Dizziness   HPI:  [name] is a 55 y.o. woman with PMHx as noted below who presents today for evaluation of dizziness.   Reports the dizziness started yesterday. She describes waking up in the morning and when she went to stand she felt the room was spinning. She thought her blood pressure may have been high so she checked and her BP reading was 179/155. She states she repeated her BP several times over the next few hours and it gradually decreased to the 140s/80s. She reports an associated headache. She also notes having a heavy menstrual period for the past 5-6 days. Her menstrual period started on 12/20 and progressively became heavy to the point that she is having to use 1 tampon and 1 pad per hour. She describes bleeding through her clothing and having clots which has never happened to her before. Her menstrual periods normally last 3-4 days and tend to be lighter. She denies any hx of fibroids. She reports still feeling dizzy when she stands up occasionally but denies any weakness, changes in speech or vision, or numbness/tingling. She denies any changes in her medications and reports she never started the Lyrica she was prescribed last month due to the cost.   Past Medical History:  Diagnosis Date  . 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 Arthritis   . Diabetes mellitus    oral tx  . GERD (gastroesophageal reflux disease)   . History of post-sterilization tuboplasty 2000  . Plantar fasciitis    Right  . Shortness of breath    with exertion  . Sleep apnea 5 plus yrs   study -pt could not sleep test inconclusive.   . Tear of meniscus of left knee    x2  . Tear of meniscus of right knee     Review of Systems:  All negative except per HPI  Physical Exam:  Vitals:   07/12/16 0902  BP: 140/73  Pulse: 68  Temp: 97.7 F (36.5 C)  TempSrc: Oral  SpO2: 100%  Weight: 219 lb (99.3 kg)  Height: 5\' 1"  (1.549 m)     Orthostatics BP lying 133/66 BP sitting 144/74 BP standing 116/69  General: well-nourished woman sitting up, NAD HEENT: [place]/AT, EOMI, sclera anicteric, conjunctiva normal, mucus membranes moist CV: RRR, no m/g/r Pulm: CTA bilaterally, breaths non-labored Ext: warm, no peripheral edema, moves all extremities Neuro: alert and oriented x 3. Smile symmetric. Shoulder shrug intact. Tongue midline. Strength 5/5 in all extremities. Sensation intact. Gait normal.   Assessment & Plan:   See Encounters Tab for problem based charting.  Patient discussed with Dr. Lynnae January

## 2016-07-12 NOTE — Assessment & Plan Note (Signed)
Patient presenting with a 5-6 day hx of heavy menstrual bleeding and 8 days total for her menstrual period. Her normal menstrual period typically lasts 3-4 days and has never been heavy in the past nor with clots. Given her age she is likely in the perimenopausal phase but I am concerned with this new heavy vaginal bleeding that other etiologies for her irregular bleeding should be evaluated, including endometrial malignancy. Referral placed to OBGYN- she may require endometrial biopsy. She does have a family hx of cervical cancer (sister) and states both sisters required hysterectomy due to bleeding.

## 2016-07-17 ENCOUNTER — Telehealth: Payer: Self-pay | Admitting: *Deleted

## 2016-07-17 DIAGNOSIS — IMO0001 Reserved for inherently not codable concepts without codable children: Secondary | ICD-10-CM

## 2016-07-17 DIAGNOSIS — E1165 Type 2 diabetes mellitus with hyperglycemia: Principal | ICD-10-CM

## 2016-07-17 NOTE — Progress Notes (Signed)
Internal Medicine Clinic Attending  Case discussed with Dr. Rivet at the time of the visit.  We reviewed the resident's history and exam and pertinent patient test results.  I agree with the assessment, diagnosis, and plan of care documented in the resident's note.  

## 2016-07-17 NOTE — Telephone Encounter (Signed)
Patient was contacted regarding GYN referral. Referral sent to [facility]

## 2016-07-17 NOTE — Telephone Encounter (Signed)
Call from pt - states her Orthopedic doctor at [facility] requesting a recent A1C done. States she getting ready for surgery also she had been off Januvia for about 1.5 months but started back x 2 weeks. If appropriate, can u put in an order? Thanks

## 2016-07-18 ENCOUNTER — Other Ambulatory Visit (INDEPENDENT_AMBULATORY_CARE_PROVIDER_SITE_OTHER): Payer: Self-pay

## 2016-07-18 DIAGNOSIS — IMO0001 Reserved for inherently not codable concepts without codable children: Secondary | ICD-10-CM

## 2016-07-18 DIAGNOSIS — E1165 Type 2 diabetes mellitus with hyperglycemia: Secondary | ICD-10-CM

## 2016-07-18 LAB — POCT GLYCOSYLATED HEMOGLOBIN (HGB A1C): Hemoglobin A1C: 8.3

## 2016-07-18 NOTE — Telephone Encounter (Signed)
Pt called again- wants A1C done this morning. Thanks

## 2016-07-18 NOTE — Telephone Encounter (Signed)
A1C ordered - pt called; will be here today.

## 2016-07-19 ENCOUNTER — Other Ambulatory Visit: Payer: Self-pay | Admitting: *Deleted

## 2016-07-19 DIAGNOSIS — R21 Rash and other nonspecific skin eruption: Secondary | ICD-10-CM

## 2016-07-19 MED ORDER — HYDROXYZINE HCL 25 MG PO TABS: 25.0000 mg | ORAL_TABLET | Freq: Three times a day (TID) | ORAL | 0 refills | Status: DC | PRN

## 2016-07-20 NOTE — Telephone Encounter (Signed)
Called to [employer]

## 2016-07-24 ENCOUNTER — Telehealth: Payer: Self-pay

## 2016-07-24 NOTE — Telephone Encounter (Signed)
Please call pt back regarding meds.  

## 2016-07-24 NOTE — Telephone Encounter (Signed)
appt for [facility] in am, itching, wants steroids

## 2016-07-25 ENCOUNTER — Other Ambulatory Visit: Payer: Self-pay | Admitting: Internal Medicine

## 2016-07-25 ENCOUNTER — Encounter: Payer: Self-pay | Admitting: Internal Medicine

## 2016-07-25 ENCOUNTER — Telehealth: Payer: Self-pay

## 2016-07-25 ENCOUNTER — Telehealth: Payer: Self-pay | Admitting: Dietician

## 2016-07-25 ENCOUNTER — Ambulatory Visit (INDEPENDENT_AMBULATORY_CARE_PROVIDER_SITE_OTHER): Payer: Self-pay | Admitting: Internal Medicine

## 2016-07-25 VITALS — BP 132/65 | HR 81 | Temp 97.9°F | Ht 61.0 in | Wt 223.0 lb

## 2016-07-25 DIAGNOSIS — Z794 Long term (current) use of insulin: Secondary | ICD-10-CM

## 2016-07-25 DIAGNOSIS — L304 Erythema intertrigo: Secondary | ICD-10-CM | POA: Insufficient documentation

## 2016-07-25 DIAGNOSIS — M25512 Pain in left shoulder: Secondary | ICD-10-CM

## 2016-07-25 DIAGNOSIS — E118 Type 2 diabetes mellitus with unspecified complications: Principal | ICD-10-CM

## 2016-07-25 DIAGNOSIS — E1165 Type 2 diabetes mellitus with hyperglycemia: Secondary | ICD-10-CM

## 2016-07-25 DIAGNOSIS — E119 Type 2 diabetes mellitus without complications: Secondary | ICD-10-CM

## 2016-07-25 DIAGNOSIS — R21 Rash and other nonspecific skin eruption: Secondary | ICD-10-CM

## 2016-07-25 DIAGNOSIS — B372 Candidiasis of skin and nail: Secondary | ICD-10-CM

## 2016-07-25 MED ORDER — INSULIN GLARGINE 100 UNIT/ML ~~LOC~~ SOLN: SUBCUTANEOUS | 11 refills | Status: DC

## 2016-07-25 MED ORDER — KETOCONAZOLE 2 % EX CREA: 1.0000 "application " | TOPICAL_CREAM | Freq: Two times a day (BID) | CUTANEOUS | 0 refills | Status: DC

## 2016-07-25 MED ORDER — METHYLPREDNISOLONE 4 MG PO TABS: ORAL_TABLET | ORAL | 0 refills | Status: DC

## 2016-07-25 MED ORDER — CETAPHIL MOISTURIZING EX LOTN: 1.0000 "application " | TOPICAL_LOTION | CUTANEOUS | 0 refills | Status: DC | PRN

## 2016-07-25 NOTE — Telephone Encounter (Signed)
Asking to speak with Helen. Please call back.  

## 2016-07-25 NOTE — Telephone Encounter (Signed)
Steroid-Induced Hyperglycemia Prevention and Management [name] is a 56 y.o. female who meets criteria for [facility] quality improvement program (diabetes patient prescribed short course of steroids).  A/P Current Regimen  Patient prescribed prednisone  4 mg x 4 x 2 days 4mg  x3 x 2 days, 4mg  x2 x 2 days, then 4 mg x 1 x 2 days x 8 days, currently on day 0 of therapy. Patient to start taking prednisone in the AM  Prednisone indication: rash  Current DM regimen lantus 32 units/day, januvia 100 mg daily  Home BG Monitoring  Patient does have a meter at home and does check BG at home. Meter was not supplied.  CBGs prior to steroid course 100-200s, A1C prior to steroid course 8.3%  S/Sx of hyper- or hypoglycemia: none, none  Medication Management  Switch prednisone dose to AM yes  Additional treatment for BG control is not indicated at this time.   Physician preference level per protocol: 1  Patient Education  Advised patient to monitor BG while on steroid therapy (at least twice daily prior to first 2 meals of the day).  Patient educated about signs/symptoms and advised to contact clinic if hyper- or hypoglycemic.  Patient did  verbalize understanding of information and regimen by repeating back topics discussed.  Follow-up tomorrow about this time by phone  2 weeks  [name], Butch Penny 4:45 PM 07/25/2016

## 2016-07-25 NOTE — Telephone Encounter (Signed)
Called to follow up with patient to see how she is doing with her blood sugars and self increasing her lantus. She says it is getting better, she is thinking of increasing it one more time. She is up to 32 units lantus at night. Her fasting blood sugars are 120-130. 119 today. She checked her blood sugar when she was dizzy it and says it was " normal- 120s to 130s". However,  She reports her two hour after eating at night is sometimes as high as 320. Discussed that this is due to her meal and lantus does not cover that. Advised her to stop increasing her lantus since her fasting blood sugars are close to target now and she had the dizziness. Her follow up with me was tomorrow, but she wanted to reschedule it. She agreed to come in in two weeks.

## 2016-07-25 NOTE — Progress Notes (Signed)
   CC: Itching and body rash  HPI:  [name] is a 56 y.o. woman with a medical history described below who presents today with diffuse itching and a rash under her left arm. This itching has been pasting for about 1-2 weeks but is getting worse. She does not notice an associated rash but does have scratch marks she has caused. There is no associated fever, chills, cough, or diarrhea. She has not tried any topical therapy for this so far. Her left arm is currently painful anticipating shoulder arthroscopy on 1/30 and she mostly holds it tight against her side due to pain. In her left armpit she has developed an area of redness and itching for the past few days.    See problem based assessment and plan below for additional details  Past Medical History:  Diagnosis Date  . 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 Arthritis   . Diabetes mellitus    oral tx  . GERD (gastroesophageal reflux disease)   . History of post-sterilization tuboplasty 2000  . Plantar fasciitis    Right  . Shortness of breath    with exertion  . Sleep apnea 5 plus yrs   study -pt could not sleep test inconclusive.   . Tear of meniscus of left knee    x2  . Tear of meniscus of right knee     Review of Systems:  Review of Systems  Constitutional: Negative for chills and fever.  HENT: Negative for congestion.   Eyes: Negative for redness.  Respiratory: Negative for shortness of breath.   Cardiovascular: Negative for chest pain.  Gastrointestinal: Negative for diarrhea.  Musculoskeletal: Negative for myalgias.  Skin: Positive for itching and rash.    Physical Exam: Physical Exam  Constitutional: She is oriented to person, place, and time and well-developed, well-nourished, and in no distress. No distress.  HENT:  Head: Normocephalic.  Eyes: Conjunctivae are normal.  Cardiovascular: Normal rate and regular rhythm.   Pulmonary/Chest: Effort normal  and breath sounds normal.  Musculoskeletal: She exhibits no edema.  Lymphadenopathy:    She has no cervical adenopathy.  Neurological: She is alert and oriented to person, place, and time.  Skin:  Skin appears normal over upper arms, back, and shins except for numerous excoriations especially over right shin and left upper arm. Under her left arm there is a confluent irregular bordered area of macular erythema with satellite lesions    Vitals:   07/25/16 0928  BP: 132/65  Pulse: 81  Temp: 97.9 F (36.6 C)  TempSrc: Oral  SpO2: 100%  Weight: 223 lb (101.2 kg)  Height: 5\' 1"  (1.549 m)    Assessment & Plan:   See Encounters Tab for problem based charting.  Patient discussed with Dr. Angelia Mould

## 2016-07-25 NOTE — Patient Instructions (Signed)
It was a pleasure to see you today [name].  For your diffuse itching I recommend using a topical moisturizing lotion twice a day especially after taking showers. I have also prescribed 8 tapered steroid pack to take 4 pills then 3 pills then 2 pills then one pills for 2 days each.  For your armpit rash I think this is consistent with a yeast rash. I prescribed ketoconazole cream to be applied in that area twice a day until the rash improves.  If your symptoms are getting worse or not responding to treatment within a week or 2 please let us know.

## 2016-07-25 NOTE — Telephone Encounter (Signed)
Pt calls to confirm appt for spouse. She states she may need to take him to ED, she is told yes, she may do so if reaction gets worse

## 2016-07-26 ENCOUNTER — Ambulatory Visit: Payer: Self-pay | Admitting: Dietician

## 2016-07-26 ENCOUNTER — Other Ambulatory Visit: Payer: Self-pay | Admitting: Dietician

## 2016-07-26 DIAGNOSIS — E1310 Other specified diabetes mellitus with ketoacidosis without coma: Secondary | ICD-10-CM

## 2016-07-26 MED ORDER — TRUE METRIX METER DEVI
1.0000 | Freq: Three times a day (TID) | 0 refills | Status: DC
Start: 2016-07-26 — End: 2016-07-26

## 2016-07-26 MED ORDER — GLUCOSE BLOOD VI STRP: ORAL_STRIP | 12 refills | Status: DC

## 2016-07-26 MED ORDER — TRUE METRIX METER DEVI: 1.0000 | Freq: Three times a day (TID) | 0 refills | Status: DC

## 2016-07-26 MED FILL — traMADol HCL 50 MG TABS: 50 | 30 days supply | Qty: 180 | Fill #0

## 2016-07-26 MED FILL — HYDROCODON-APAP 10-325: 10-325 | 30 days supply | Qty: 90 | Fill #0

## 2016-07-26 NOTE — Telephone Encounter (Signed)
Called patient to follow up on how steroids are affecting [name] blood sugars. She was only able to test one time today because she ran out of strips. [name] blood sugar was 400 after lunch today.  Will not adjust lantus because we are supposed to have two blood sugars > 200 mg/dl.  P have [name] move lantus to morning time by taking half [name] dose tonight and half [name] dose in the morning then [name] whole dose Saturday morming. If another CBG is > 200 mg.dl then will increase [name] lantus by 10-20% per protocol tomorrow. She would like to use Novolog insulin with steroids and a meter and strips from [facility]  Will order meter and talk to Dr.Patel about [name] request for Novolog to sue with steroids

## 2016-07-27 ENCOUNTER — Telehealth: Payer: Self-pay | Admitting: Dietician

## 2016-07-27 ENCOUNTER — Other Ambulatory Visit: Payer: Self-pay | Admitting: Internal Medicine

## 2016-07-27 ENCOUNTER — Other Ambulatory Visit: Payer: Self-pay | Admitting: Pharmacist

## 2016-07-27 DIAGNOSIS — R21 Rash and other nonspecific skin eruption: Secondary | ICD-10-CM

## 2016-07-27 MED ORDER — PREDNISONE 5 MG PO TABS: ORAL_TABLET | ORAL | 0 refills | Status: DC

## 2016-07-27 MED FILL — TRUE METRIX GLUCOSE TEST ST: 30 days supply | Qty: 100 | Fill #0 | Status: TO

## 2016-07-27 NOTE — Telephone Encounter (Signed)
Please call patient back as she can not afford her new medication methylPREDNISolone (MEDROL) 4 MG tablet  that was prescribed on 07/25/2016. Patient would like a call back.

## 2016-07-27 NOTE — Assessment & Plan Note (Addendum)
A: Candidal rash with characteristic satellite lesions present under left arm. I suspect this is due to less mobility of the arm pending her upcoming surgery on the 30th. Her diabetes is not wildly uncontrolled but may also be a contributing factor to this infection.   P: - Ketoconazole 2% cream BID for a week/clearance

## 2016-07-27 NOTE — Telephone Encounter (Signed)
O/A: Follow up for Prevention of steroid  Induced Hyperglycemia Protocol Patient took her last dose of steroids that she had from a previous prescription and is waiting to hear from our office because she cannot afford the current prescription. Her blood sugar fasting today was 180 and this after noon it was over 300 mg/dl. She took 16 units lantus last night and this am, She is to take 32 units lantus tomorrow morning.   Plan:  She was instructed that if she gets steroids and continue on them to increase her lantus on Sunday morning to 35 units and to do this every day whole on steroids and when she stops the steroids to drop back to the 32 units of lantus.  If she does not get steroids, then to remain on 32 units of lantus. She verbalized understanding. Will ask triage/pharmacy to call her about the prescription and call her Monday

## 2016-07-27 NOTE — Telephone Encounter (Signed)
Resolved. Thank you

## 2016-07-27 NOTE — Assessment & Plan Note (Signed)
A: She appears to have very diffusely dry skin with not an obvious erythematous rash over most of the affected area but some excoriations. She has had numerous episodes of intensely prutitic skin without ever having a biopsy of an obvious rash during clinic evaluation. Given her extensive area involved I will prescribe a very limited taper of oral glucocorticoids as topical therapy will be difficult to follow. I also believe there is a component of xerosis.  P: - Solumedrol dosepak 8 day taper - Recommend topical emollient

## 2016-07-27 NOTE — Progress Notes (Signed)
Interchanged methylprednisolone to prednisone due to cost. Also transferred prescription to [facility] per patient request.

## 2016-07-31 ENCOUNTER — Other Ambulatory Visit: Payer: Self-pay | Admitting: Internal Medicine

## 2016-07-31 DIAGNOSIS — R21 Rash and other nonspecific skin eruption: Secondary | ICD-10-CM

## 2016-07-31 MED ORDER — CETIRIZINE HCL 10 MG PO TABS: 10.0000 mg | ORAL_TABLET | Freq: Every day | ORAL | 0 refills | Status: DC

## 2016-07-31 MED ORDER — PREDNISONE 5 MG PO TABS: ORAL_TABLET | ORAL | 0 refills | Status: DC

## 2016-07-31 MED ORDER — HYDROXYZINE HCL 25 MG PO TABS: 25.0000 mg | ORAL_TABLET | Freq: Three times a day (TID) | ORAL | 0 refills | Status: DC | PRN

## 2016-07-31 MED FILL — predniSONE 5 MG TABS: 5 | 6 days supply | Qty: 20 | Fill #0

## 2016-07-31 NOTE — Progress Notes (Signed)
Internal Medicine Clinic Attending  Case discussed with Dr. Rice at the time of the visit.  We reviewed the resident's history and exam and pertinent patient test results.  I agree with the assessment, diagnosis, and plan of care documented in the resident's note.  

## 2016-07-31 NOTE — Progress Notes (Signed)
Changed prescriptions to fax for [facility] for antihistamines and resent oral prednisone course script to [facility] outpatient pharmacy at patient request.

## 2016-08-08 ENCOUNTER — Telehealth: Payer: Self-pay | Admitting: Internal Medicine

## 2016-08-08 NOTE — Telephone Encounter (Signed)
Pt calling asking to speak to helen

## 2016-08-08 NOTE — Telephone Encounter (Signed)
Called and clarified scripts at pharmacy

## 2016-08-09 ENCOUNTER — Encounter: Payer: Self-pay | Admitting: Obstetrics and Gynecology

## 2016-08-10 ENCOUNTER — Ambulatory Visit: Payer: Self-pay | Admitting: Dietician

## 2016-08-13 ENCOUNTER — Telehealth: Payer: Self-pay | Admitting: Internal Medicine

## 2016-08-13 NOTE — Telephone Encounter (Signed)
Calling states she is supposed to have surgery tomorrow but has a cold of some sort. We have no appts today. Pt would like to talk to nurse for her options

## 2016-08-13 NOTE — Telephone Encounter (Signed)
Pt states she is still having a rash and it drives her crazy She is having surgery tomorrow and will talk to surgeon about taking more steroids

## 2016-08-13 NOTE — Telephone Encounter (Signed)
Pt is calling back requesting to speak with a nurse.

## 2016-08-14 NOTE — Telephone Encounter (Signed)
Patient calling [name] back

## 2016-08-14 NOTE — Telephone Encounter (Signed)
Calling back requesting to speak with Bonnita Nasuti. Please call back.

## 2016-08-15 ENCOUNTER — Ambulatory Visit (INDEPENDENT_AMBULATORY_CARE_PROVIDER_SITE_OTHER): Payer: Self-pay | Admitting: Internal Medicine

## 2016-08-15 VITALS — BP 128/76 | HR 96 | Temp 97.6°F | Wt 224.4 lb

## 2016-08-15 DIAGNOSIS — R21 Rash and other nonspecific skin eruption: Secondary | ICD-10-CM

## 2016-08-15 DIAGNOSIS — L299 Pruritus, unspecified: Secondary | ICD-10-CM

## 2016-08-15 DIAGNOSIS — Z79891 Long term (current) use of opiate analgesic: Secondary | ICD-10-CM

## 2016-08-15 DIAGNOSIS — G8929 Other chronic pain: Secondary | ICD-10-CM

## 2016-08-15 NOTE — Telephone Encounter (Signed)
I called pt back yesterday, she wanted to tell me surgery was cancelled yesterday due to rash

## 2016-08-15 NOTE — Progress Notes (Signed)
   CC: Itching  HPI:  [name] is a 56 y.o. woman here for follow up of her continued itching.   See problem based assessment and plan below for additional details  Past Medical History:  Diagnosis Date  . 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 Arthritis   . Diabetes mellitus    oral tx  . GERD (gastroesophageal reflux disease)   . History of post-sterilization tuboplasty 2000  . Plantar fasciitis    Right  . Shortness of breath    with exertion  . Sleep apnea 5 plus yrs   study -pt could not sleep test inconclusive.   . Tear of meniscus of left knee    x2  . Tear of meniscus of right knee     Review of Systems:  Review of Systems  Constitutional: Negative for fever.  Respiratory: Negative for shortness of breath.   Cardiovascular: Negative for leg swelling.  Musculoskeletal: Negative for joint pain.  Skin: Positive for itching and rash.  Neurological: Negative for sensory change.  Psychiatric/Behavioral: The patient does not have insomnia.     Physical Exam: Physical Exam  Constitutional:  Obese woman in no acute distress  Cardiovascular: Normal rate and regular rhythm.   Pulmonary/Chest: Effort normal and breath sounds normal.  Musculoskeletal: She exhibits no edema.  Skin: Skin is warm and dry.  Some excoriations present over shins, upper arms, and flanks, otherwise skin intact throughout    Vitals:   08/15/16 1513  BP: 128/76  Pulse: 96  Temp: 97.6 F (36.4 C)  TempSrc: Oral  SpO2: 100%  Weight: 224 lb 6.4 oz (101.8 kg)    Assessment & Plan:   See Encounters Tab for problem based charting.  Patient discussed with Dr. Daryll Drown

## 2016-08-15 NOTE — Patient Instructions (Signed)
ECZEMA OVERVIEW-Atopic dermatitis, also known as eczema, is a skin problem that causes dry, itchy, scaly, red skin. It can occur in infants, children, and adults, and seems more common in certain families. Eczema can be treated with moisturizers and prescription ointments.  ECZEMA CAUSES-The cause of eczema is not completely understood, although hereditary factors appear to play a strong role. In most people, atopic dermatitis is caused by a genetic dysfunction in the outermost layer of the skin (the epidermis). The epidermis is the first line of defense between the body and the environment. When the epidermis is intact, it keeps environmental irritants, allergens, and microbes from entering the body. Despite popular belief, in children, eczema is rarely linked to food allergies. If a food allergy is suspected, the child should be evaluated by an allergy specialist.  ECZEMA SYMPTOMS-Most people with eczema develop their first symptoms before age 38. Intense itching of the skin, patches of redness, small bumps, and skin flaking are common. Scratching can cause additional skin inflammation, which can further worsen the itching. The itchiness may be more noticeable at nighttime. Features of eczema vary from one individual to another, and can change over time. Although eczema is usually confined to specific areas of the body, it may affect multiple areas in severe cases: ?In children and adults, eczema commonly affects the back of the neck, the elbow creases, and the backs of the knees (picture 1). Other affected areas may include the face, wrists, and forearms (picture 2). The skin may become thickened and darkened, or even scarred, from repeated scratching. The skin can also become infected as a result of scratching. Signs of infection include painful red bumps that sometimes contain pus; a healthcare provider should be consulted if this occurs. Other findings in people with eczema can include: ?Dry,  scaly skin ?Plugged hair follicles causing small bumps to develop, usually on the face, upper arms, and thighs ?Increased skin creasing on the palms and/or an extra fold of skin under the eye ?Darkening of the skin around the eyes  ECZEMA DIAGNOSIS-There is no specific test used to diagnose eczema. The diagnosis is usually based upon a person's medical history and physical examination. Factors that strongly suggest eczema include long-standing and recurrent itching, a personal or family history of allergic conditions, and an early age when symptoms began. Other factors include worsened symptoms after exposure to certain triggers or any of the skin findings noted above.  ECZEMA TREATMENT-Eczema is a chronic condition; it typically improves and then flares (worsens) periodically. Some people have no symptoms for several years. Eczema is not curable, although symptoms can be controlled with a variety of self-care measures and drug therapy. Who treats eczema?-Many patients with atopic dermatitis can initially be treated by their primary care provider. However, a skin specialist (dermatologist) may be recommended in certain situations, such as if the condition does not improve with treatment, if certain areas of the body are affected (face or skin folds), and if another condition could be causing symptoms. Eliminate aggravating factors-Eliminating factors that worsen eczema can help to control the symptoms. Aggravating factors may include: ?Heat, perspiration, dry environments ?Emotional stress or anxiety ?Rapid temperature changes ?Exposure to certain chemicals or cleaning solutions, including soaps and detergents, perfumes and cosmetics, wool or synthetic fibers, dust, sand, and cigarette smoke Keep the skin hydrated  Emollients-Emollients are creams and ointments that moisturize the skin and prevent it from drying out. The best emollients for people with atopic dermatitis are thick creams  (such as Eucerin, Cetaphil,  and Nutraderm) or ointments (such as petroleum jelly, Aquaphor, and Vaseline), which contain little to no water. Emollients are most effective when applied immediately after bathing. Emollients can be applied twice a day or more often if needed. Lotions contain more water than creams and ointments and are less effective for moisturizing the skin.  Bathing-It is not clear if showers or baths are better for keeping the skin hydrated. Lukewarm baths or showers can hydrate and cool the skin, temporarily relieving the itching of eczema. An unscented, mild soap or nonsoap cleanser (such as Cetaphil) should be used sparingly. An emollient should be applied immediately after bathing or showering to prevent the skin from drying out as a result of water evaporation. However, hot or long baths (greater than 10 to 15 minutes) and showers should be avoided since they can dry out the skin. In some cases, healthcare providers may recommend dilute bleach baths for people with eczema. These baths help to decrease the number of bacteria on the skin that can cause infections or worsen symptoms. To prepare a bleach bath,  to  cup of bleach is placed in a full bathtub (about 40 gallons) of water. Bleach baths are usually taken for five to ten minutes twice per week.  Topical steroids-Prescription steroid (corticosteroid) creams and ointments may be recommended to control mild to moderate atopic dermatitis. Steroid creams and ointments are available in a variety of strengths (potencies); the least potent are available without a prescription (eg, hydrocortisone 1% cream). More potent formulations require a prescription. Steroid creams or ointments are usually applied to the skin once or twice per day. These help to reduce symptoms and moisturize the skin. As the skin improves, a non-medicated emollient can be resumed. Strong topical steroids may be needed to control severe flares of eczema;  however, these should be used for only short periods of time to prevent thinning of the skin.  Other skin treatments-Newer skin treatments for eczema include tacrolimus (Protopic) and pimecrolimus (Elidel). These are effective in controlling eczema, although they do not work as quickly as topical steroids. They are useful in sensitive areas such as the face and groin, and can be used in children over age two. Due to safety concerns, these treatments should only be used as instructed by a healthcare provider.  Oral steroids-Oral steroids (eg, prednisone) occasionally are used to treat a severe flare of eczema, although this treatment is not usually recommended on a regular basis because of potential side effects.  Ultraviolet light therapy (phototherapy)-Ultraviolet light therapy (phototherapy) can effectively control atopic dermatitis. However, this therapy is expensive, may increase a person's risk for skin cancer, and is therefore recommended only for people with severe eczema who do not respond to other treatments.  Immunosuppressive drugs-Drugs that weaken the immune system may be recommended for people with severe eczema who do not improve with other treatments. Treatment with these drugs can cause serious side effects, including an increased risk for infection.  Injectable medications-The injectable "biologic" medication dupilumab (brand name: Dupixent), which targets the immune system, may be beneficial for treating atopic dermatitis. Due to its high cost and potential side effects, this drug is reserved for adults with moderate to severe atopic dermatitis that has not responded to other treatments. Control itching  Oral antihistamines-Oral antihistamines sometimes help relieve the itching of eczema. The over-the-counter antihistamine diphenhydramine (Benadryl), and prescription antihistamines, such as hydroxyzine (Atarax) and cyproheptadine, are most effective for itching caused by  eczema, although these drugs can cause drowsiness.  The  nonsedating antihistamines such as cetirizine (Zyrtec) and loratadine (Claritin) may relieve symptoms, and both are available without a prescription in the [place].  Wet dressings-Wet dressings help soothe and hydrate the skin, reduce itching and redness, loosen crusted areas, and prevent skin injury from scratching. Dampened cotton garments may be worn over the affected area and covered with a dry garment. The person may wear these dressings overnight or change them every eight hours during the day.  Can eczema be prevented?-Babies who have a parent, brother, or sister with eczema have a high risk of developing atopic eczema. In these babies, the use of moisturizing creams or ointments from the first week of life may prevent eczema during the first year. However, it is uncertain whether this measure is effective in preventing eczema later in life.  WHERE TO GET MORE INFORMATION-Your healthcare provider is the best source of information for questions and concerns related to your medical problem. This article will be updated as needed on our website ([email or web address]). Related topics for patients, as well as selected articles written for healthcare professionals, are also available. Some of the most relevant are listed below.

## 2016-08-18 NOTE — Assessment & Plan Note (Addendum)
HPI: She has been suffering from similar pruritis intermittently at least the past year and a half. It has been bothering her for about 4 weeks now. It only fully resolved with a short course of oral corticosteroids but recurred after stopping. Topical triamcinolone was also somewhat effective. She was unable to proceed with left shoulder arthroscopy recently due to her ongoing symptoms and more importantly the recent use of systemic steroids. She reports noticing some rash on her face and flanks but during clinic evaluations has had few skin changes besides excoriations.  A: Diffuse pruritis with minimal rash over many scattered skin areas that has been problematic intermittently for over a year. I have not seen any actual rash during either visit in the past month only excoriations from her itching. This seems most likely to be an atopic dermatitis. She is trying some measures such as topical emollients without much improvement since our last discussion.  I do not think repeated corticosteroid treatments are doing her good in the long run and if this does not improve on other treatments referral back to dermatology for evaluation may be the next option.  She is on chronic opioid therapy for pain. The time course does not fit very closely to the duration of her symptoms but this is possibly a cause for pruritis. She is very resistant to any modifications on this treatment and feels her functional status would worsen dramatically if she tried to decrease pain treatment to explore this as a cause.  P: -Information handout provided today with recommendations for aggressive skin hydration including- -Mild temperature bathing -Moisture sparing cleansers/non saponified where able -Topical emollients at night and after washing -Home humidification -She may benefit from antihistamine therapy as well to spare steroid treatments -Consider referral if repeat problem or biopsy if true rash is present

## 2016-08-21 NOTE — Progress Notes (Signed)
[facility] Medicine Clinic Attending  Case discussed with Dr. Rice soon after the resident saw the patient.  We reviewed the resident's history and exam and pertinent patient test results.  I agree with the assessment, diagnosis, and plan of care documented in the resident's note. 

## 2016-08-24 ENCOUNTER — Ambulatory Visit: Payer: Self-pay | Admitting: Family Medicine

## 2016-08-27 ENCOUNTER — Encounter: Payer: Self-pay | Admitting: Internal Medicine

## 2016-08-27 MED FILL — HYDROCODON-APAP 10-325: 10-325 | 30 days supply | Qty: 90 | Fill #0

## 2016-08-27 MED FILL — traMADol HCL 50 MG TABS: 50 | 30 days supply | Qty: 180 | Fill #0

## 2016-08-27 MED FILL — TRIAMCINOLONE 0.1% LOTION: 0.1 | 20 days supply | Qty: 60 | Fill #0

## 2016-08-29 ENCOUNTER — Ambulatory Visit (INDEPENDENT_AMBULATORY_CARE_PROVIDER_SITE_OTHER): Payer: Self-pay | Admitting: Internal Medicine

## 2016-08-29 ENCOUNTER — Encounter: Payer: Self-pay | Admitting: Internal Medicine

## 2016-08-29 VITALS — BP 134/73 | HR 72 | Temp 97.8°F | Ht 61.0 in | Wt 227.5 lb

## 2016-08-29 DIAGNOSIS — M797 Fibromyalgia: Secondary | ICD-10-CM

## 2016-08-29 DIAGNOSIS — Z79899 Other long term (current) drug therapy: Secondary | ICD-10-CM

## 2016-08-29 DIAGNOSIS — L299 Pruritus, unspecified: Secondary | ICD-10-CM

## 2016-08-29 MED ORDER — DULOXETINE HCL 60 MG PO CPEP: 60.0000 mg | ORAL_CAPSULE | Freq: Every day | ORAL | 2 refills | Status: DC

## 2016-08-29 MED FILL — DULoxetine HCL 60 MG CPEP: 60 | 30 days supply | Qty: 30 | Fill #0

## 2016-08-29 NOTE — Assessment & Plan Note (Signed)
Patient has a 2 year history of pruritus without visible rash. The pruritus is localized to her lower extremities, buttocks, lower abdomen. She states it seems to come on every 3 months, lasts for one month, and then resolves on its own. Then, 2 months later it well occur again. She has been seen several times in the past for this issue, including dermatology without a definitive diagnosis. She has been on hydroxyzine, cetirizine, cetaphil, Benadryl, topical steroids without relief. She is even received steroid burst several times in the past. She states that each of these medications seemed to help for short period of time and then will occur again. She has had a thorough workup for systemic illnesses causing pruritus. Her CBC, complete metabolic panel, HIV, TSH, chest x-ray have all been normal or negative. She denies any associated fevers, chills, weight loss, night sweats. No visible lesions on exam or rash.  Assessment: Pruritus without visible rash. Unclear etiology, even dermatology wasn't sure. No areas to biopsy. Could this be psychogenic, especially since this began after the death of her child? Systemic causes have been worked up.  Plan: -Continue Zyrtec and hydroxyzine as needed -Continue moisturizers and topical triamcinolone -Follow-up with dermatology as needed -We will restart Cymbalta in case this is psychogenic in the setting of fibromyalgia

## 2016-08-29 NOTE — Patient Instructions (Signed)
Continue your current medications.  Start Cymbalta 60 mg once a day. This may help with fibromyalgia and itching. It may take 4-6 weeks to work.

## 2016-08-29 NOTE — Assessment & Plan Note (Signed)
Patient is agreeable to restarting Cymbalta.  Plan: -Cymbalta 60 mg daily

## 2016-08-29 NOTE — Progress Notes (Signed)
    CC: Pruritus  HPI: [name] is a 56 y.o. female with PMHx of hypertension, type 2 diabetes, intertrigo who presents to the clinic for pruritus.   Patient has a 2 year history of pruritus without visible rash. The pruritus is localized to her lower extremities, buttocks, lower abdomen. She states it seems to come on every 3 months, lasts for one month, and then resolves on its own. Then, 2 months later it well occur again. She has been seen several times in the past for this issue, including dermatology without a definitive diagnosis. She has been on hydroxyzine, cetirizine, cetaphil, Benadryl, topical steroids without relief. She is even received steroid burst several times in the past. She states that each of these medications seemed to help for short period of time and then will occur again. She has had a thorough workup for systemic illnesses causing pruritus. Her CBC, complete metabolic panel, HIV, TSH, chest x-ray have all been normal or negative. She denies any associated fevers, chills, weight loss, night sweats.  Past Medical History:  Diagnosis Date  . 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 Arthritis   . Diabetes mellitus    oral tx  . GERD (gastroesophageal reflux disease)   . History of post-sterilization tuboplasty 2000  . Plantar fasciitis    Right  . Shortness of breath    with exertion  . Sleep apnea 5 plus yrs   study -pt could not sleep test inconclusive.   . Tear of meniscus of left knee    x2  . Tear of meniscus of right knee      Review of Systems: Please see pertinent ROS reviewed in HPI and problem based charting.   Physical Exam: Vitals:   08/29/16 1049  BP: 134/73  Pulse: 72  Temp: 97.8 F (36.6 C)  TempSrc: Oral  SpO2: 98%  Weight: 227 lb 8 oz (103.2 kg)  Height: 5\' 1"  (1.549 m)   General: Vital signs reviewed.  Patient is well-developed and well-nourished, in no acute distress and  cooperative with exam.  Extremities:  No lower extremity edema bilaterally.  Skin: Xerotic skin noted on bilateral forearms. no visible rash or erythema. Skin on lower extremities is not excessively dry. Scattered scratch marks.  Psychiatric: Normal mood and affect. speech and behavior is normal. Cognition and memory are normal.   Assessment & Plan:  See encounters tab for problem based medical decision making. Patient discussed with Dr. Dareen Piano

## 2016-08-30 NOTE — Progress Notes (Signed)
Internal Medicine Clinic Attending  Case discussed with Dr. Burns soon after the resident saw the patient.  We reviewed the resident's history and exam and pertinent patient test results.  I agree with the assessment, diagnosis, and plan of care documented in the resident's note. 

## 2016-08-31 ENCOUNTER — Encounter: Payer: Self-pay | Admitting: Family Medicine

## 2016-08-31 ENCOUNTER — Ambulatory Visit (INDEPENDENT_AMBULATORY_CARE_PROVIDER_SITE_OTHER): Payer: Self-pay | Admitting: Family Medicine

## 2016-08-31 VITALS — BP 154/75 | Ht 62.0 in | Wt 210.0 lb

## 2016-08-31 DIAGNOSIS — M62830 Muscle spasm of back: Secondary | ICD-10-CM

## 2016-08-31 NOTE — Progress Notes (Signed)
  [name] - 56 y.o. female MRN [id number]  Date of birth: 03/19/1961    SUBJECTIVE:      Chief Complaint:/ HPI:   Muscle spasm in her low back. She scheduled for rotator cuff surgery on her left shoulder next week. Has been doing more sitting because she's not been to do his many activities since her shoulders been bothering her. Ex this is what has gotten her back flared up. Spasm is in the central to right lumbar area. Does not radiate into the buttock or the leg. No change in bowel or bladder continence. Pain is localized, aching and stabbing, 6 out of 10 pain. Worse with certain motions such as hyperextension stretching.  ROS:     No unusual weight change, no fever. See history of present illness.  PERTINENT  PMH / PSH FH / / SH:  Past Medical, Surgical, Social, and Family History Reviewed & Updated in the EMR.  Pertinent findings include:  Diabetes mellitus   nonsmoker Lumbar MRI March 2016 shows no herniation, no nerve root encroachment. She does have a lot of uncovertebral and facet joint arthropathy.  OBJECTIVE: BP (!) 154/75   Ht 5\' 2"  (1.575 m)   Wt 210 lb (95.3 kg)   LMP 07/04/2016   BMI 38.41 kg/m   Physical Exam:  Vital signs are reviewed. BACK: No defect. No masses noted. She can flex at the hips to 90. Hyperextension to 10 but she has some mild increase in her pain with this. Lateral rotation at the hips also increase her pain particularly when she rotates the right. Mid lumbar area mildly diffusely tender to palpation. There is no tenderness to palpation over the PSIS on either side. The piriformis area is without tenderness. NEURO: Straight leg raise negative bilaterally  EXTREMITY: Lower strandy strength 5 out of 5 flexion and extension, hip, knee, ankle.   INJECTION: Patient was given informed consent, signed copy in the chart. Appropriate time out was taken. Area prepped and draped in usual sterile fashion. 1/2 cc of methylprednisolone 40 mg/ml plus   *3of 1% lidocaine without epinephrine was injected into the area of muscle spasm in the lumbar back using a(n) perpendicular approach. The patient tolerated the procedure well. There were no complications. Post procedure instructions were given.  ASSESSMENT & PLAN:  .

## 2016-09-06 ENCOUNTER — Encounter: Payer: Self-pay | Admitting: Obstetrics and Gynecology

## 2016-09-11 NOTE — Addendum Note (Signed)
Addended by: Hulan Fray on: 09/11/2016 01:48 PM   Modules accepted: Orders

## 2016-09-12 ENCOUNTER — Telehealth: Payer: Self-pay | Admitting: *Deleted

## 2016-09-12 NOTE — Telephone Encounter (Signed)
CALLED PATIENT REGARDING HER GI REFERRAL. NEEDING TO KNOW IF SHE GOING TO RESCHEDULE OR WANTS TO CANCEL THIS REFERRAL

## 2016-09-19 ENCOUNTER — Telehealth: Payer: Self-pay | Admitting: Internal Medicine

## 2016-09-19 ENCOUNTER — Encounter: Payer: Self-pay | Admitting: Internal Medicine

## 2016-09-19 MED FILL — oxyCODONE HCL 5 MG TABS: 5 | 5 days supply | Qty: 60 | Fill #0

## 2016-09-19 NOTE — Telephone Encounter (Signed)
Pt wants you to know, surgeon is prescribing percocet, had shoulder surg 3/6 at [facility]

## 2016-09-19 NOTE — Telephone Encounter (Signed)
I told her this as she stated she had taken both several times, cautioned her that this could cause problems and to please refrain, she was agreeable

## 2016-09-19 NOTE — Telephone Encounter (Signed)
Needs to talk to nurse °

## 2016-09-19 NOTE — Telephone Encounter (Signed)
Please advise patient she should hold off on taking the pain medicine she receives from the [facility] while she is taking the Percocet prescribed by her surgeon.  Thank you.

## 2016-09-26 ENCOUNTER — Telehealth: Payer: Self-pay | Admitting: Internal Medicine

## 2016-09-26 ENCOUNTER — Ambulatory Visit (INDEPENDENT_AMBULATORY_CARE_PROVIDER_SITE_OTHER): Payer: Self-pay | Admitting: Internal Medicine

## 2016-09-26 ENCOUNTER — Encounter: Payer: Self-pay | Admitting: Internal Medicine

## 2016-09-26 VITALS — BP 137/63 | HR 73 | Temp 98.0°F | Ht 62.0 in | Wt 233.3 lb

## 2016-09-26 DIAGNOSIS — I10 Essential (primary) hypertension: Secondary | ICD-10-CM

## 2016-09-26 DIAGNOSIS — M722 Plantar fascial fibromatosis: Secondary | ICD-10-CM

## 2016-09-26 DIAGNOSIS — M720 Palmar fascial fibromatosis [Dupuytren]: Secondary | ICD-10-CM

## 2016-09-26 DIAGNOSIS — Z9889 Other specified postprocedural states: Secondary | ICD-10-CM

## 2016-09-26 DIAGNOSIS — Z79891 Long term (current) use of opiate analgesic: Secondary | ICD-10-CM

## 2016-09-26 DIAGNOSIS — R21 Rash and other nonspecific skin eruption: Secondary | ICD-10-CM

## 2016-09-26 DIAGNOSIS — G8929 Other chronic pain: Secondary | ICD-10-CM

## 2016-09-26 DIAGNOSIS — E119 Type 2 diabetes mellitus without complications: Secondary | ICD-10-CM

## 2016-09-26 DIAGNOSIS — Z8739 Personal history of other diseases of the musculoskeletal system and connective tissue: Secondary | ICD-10-CM

## 2016-09-26 DIAGNOSIS — M797 Fibromyalgia: Secondary | ICD-10-CM

## 2016-09-26 DIAGNOSIS — E1310 Other specified diabetes mellitus with ketoacidosis without coma: Secondary | ICD-10-CM

## 2016-09-26 DIAGNOSIS — M5126 Other intervertebral disc displacement, lumbar region: Secondary | ICD-10-CM

## 2016-09-26 DIAGNOSIS — Z794 Long term (current) use of insulin: Secondary | ICD-10-CM

## 2016-09-26 MED ORDER — OXYCODONE-ACETAMINOPHEN 10-325 MG PO TABS: 1.0000 | ORAL_TABLET | Freq: Three times a day (TID) | ORAL | 0 refills | Status: DC | PRN

## 2016-09-26 MED ORDER — OXYCODONE-ACETAMINOPHEN 5-325 MG PO TABS: 2.0000 | ORAL_TABLET | Freq: Three times a day (TID) | ORAL | 0 refills | Status: DC | PRN

## 2016-09-26 MED ORDER — HYDROXYZINE HCL 25 MG PO TABS: 25.0000 mg | ORAL_TABLET | Freq: Three times a day (TID) | ORAL | 0 refills | Status: DC | PRN

## 2016-09-26 MED FILL — traMADol HCL 50 MG TABS: 50 | 30 days supply | Qty: 180 | Fill #0

## 2016-09-26 NOTE — Patient Instructions (Signed)
It was a pleasure to see you again [name].  Please continue to follow up with your surgeons.  We are changing your pain medicine from Hydrocodone-Acetaminophen (Norco) to Oxycodone-Acetaminophen. Please take this every 8 hours only as needed.  Please call us if you are experiencing increased fatigue, sleepiness, constipation, or confusion.  Please continue to check your blood sugars and try at least three times per day before meals. Bring your meter with you on your next visit.  Please follow up with me in 1 month.

## 2016-09-26 NOTE — Telephone Encounter (Signed)
Changed prescription to Oxycodone/APAP 5-325 mg two tablets every 8 hours as needed #180/month. Rx printed and provided to RN.

## 2016-09-26 NOTE — Telephone Encounter (Signed)
Called pharmacy, [facility] does not cover 10/325mg  oxycodone, needs to change to 5/325mg , she is on her way back down to clinic

## 2016-09-26 NOTE — Progress Notes (Signed)
CC: T2DM  HPI:  [name] is a 56 y.o. female with PMH of T2DM, chronic pain, HTN, and fibromyalgia who presents for follow up management of her T2DM, HTN, and Chronic Pain.  T2DM: Last Hgb A1c was 8.3 on 07/18/16. She takes Lantus 35 units qhs and Januvia 100 mg daily. She did not bring her meter today, but says she checks her blood sugar once or twice a day with most recent readings between 95-100. She denies any recent hypoglycemic or hyperglycemic episodes.  Chronic Pain and Fibromyalgia: Patient with difficult to control chronic pain secondary to fibromyalgia, MSK joint pain, and L4-5 disc protrusion. She has Sports Medicine, Orthopedics, Plastic Surgery, and Podiatry for her MSK pain, adhesive capsulitis, small rotator cuff tear of her left shoulder, Dupuytren's contractures, and plantar fasciitis.    She is s/p left shoulder arthroscopy with rotator cuff repair on 09/18/2016. She was prescribed Oxycodone 5 mg 1-2 tabs every 4 hours prn by her surgeon for post surgical pain. She has not taken her chronic Norco while taking the Oxycodone. She does report pain relief with Oxycodone at her left shoulder but also with her chronic back pain. She is wondering if we can make a change in her chronic pain meds from Hydrocodone to Oxycodone. She currently has a pain contract with [name] and has been taking: - Norco 10-325 mg 1 tablet TID as needed #90/month - Tramadol 50 mg 2 tablets q8h as needed #180/month  She does report some somnolence after taking the Oxycodone which she attributes to frequent Benadryl use and suspected underlying sleep apnea. She reports regular bowel movements, 2-3 per day.  HTN: BP is 137/63. She was previously taking Lisinopril 10 mg but discontinued due to blood pressure drops to "70/50s." She denies any recent lightheadedness, dizziness, falls, or syncopal episodes.   Past Medical History:  Diagnosis Date  . Adhesive capsulitis of right shoulder    with  underlying tendinopathy rotator cuff  . Arthritis   . Diabetes mellitus    oral tx  . GERD (gastroesophageal reflux disease)   . History of post-sterilization tuboplasty 2000  . Plantar fasciitis    Right  . Shortness of breath    with exertion  . Sleep apnea 5 plus yrs   study -pt could not sleep test inconclusive.   . Tear of meniscus of left knee    x2  . Tear of meniscus of right knee     Review of Systems:   Review of Systems  Constitutional: Negative for diaphoresis and malaise/fatigue.  Respiratory: Negative for shortness of breath.   Gastrointestinal: Negative for constipation.  Musculoskeletal: Positive for back pain and joint pain. Negative for falls.  Skin:       She reports left arm rash improved with contact barrier under sling  Neurological: Negative for dizziness, focal weakness and loss of consciousness.     Physical Exam:  Vitals:   09/26/16 1322  BP: 137/63  Pulse: 73  Temp: 98 F (36.7 C)  TempSrc: Oral  SpO2: 100%  Weight: 233 lb 4.8 oz (105.8 kg)  Height: 5\' 2"  (1.575 m)   Physical Exam  Constitutional: She is oriented to person, place, and time. She appears well-developed and well-nourished. No distress.  Cardiovascular: Normal rate and regular rhythm.   No murmur heard. Pulmonary/Chest: Effort normal. No respiratory distress. She has no wheezes. She has no rales.  Musculoskeletal:  Left arm in sling s/p arthroscopy and rotator cuff repair  Neurological: She is  alert and oriented to person, place, and time.  Skin: She is not diaphoretic.  Psychiatric: She has a normal mood and affect.    Assessment & Plan:   See Encounters Tab for problem based charting.  Patient discussed with Dr. Beryle Beams

## 2016-09-26 NOTE — Telephone Encounter (Signed)
Problem filling rx please call waiting at pharmacy

## 2016-09-27 MED ORDER — SENNOSIDES 8.6 MG PO TABS: 1.0000 | ORAL_TABLET | Freq: Every day | ORAL | 6 refills | Status: DC | PRN

## 2016-09-27 MED FILL — OXYCODONE W/APAP 5/325 TAB: 5-325 | 30 days supply | Qty: 180 | Fill #0

## 2016-09-27 MED FILL — CYCLOBENZAPRINE 10 MG TAB: 10 | 30 days supply | Qty: 60 | Fill #0

## 2016-09-27 NOTE — Assessment & Plan Note (Signed)
She is s/p left shoulder arthroscopy with rotator cuff repair on 09/18/2016.   She had a contact rash to her sling which has now resolved after using a cloth barrier between her arm and sling. She is following up with her surgeon this week with plans to start PT. Pain is fairly controlled.  - Continue f/u with Orthopedics and PT as scheduled - Please see "Long-term current use of opiate analgesic" under problem list for A/P of pain control

## 2016-09-27 NOTE — Assessment & Plan Note (Signed)
Last Hgb A1c was 8.3 on 07/18/16. She takes Lantus 35 units qhs and Januvia 100 mg daily. She did not bring her meter today, but says she checks her blood sugar once or twice a day with most recent readings between 95-100. She denies any recent hypoglycemic or hyperglycemic episodes.  Patient reports improved blood sugars at home, however did not bring meter. Will continue her current medications and advise her to check her blood sugar three times daily and bring her meter on follow up. - Continue Lantus 35 units qhs - Continue Januvia 100 mg daily - Repeat Hgb A1c in 1 month

## 2016-09-27 NOTE — Assessment & Plan Note (Signed)
Patient currently takes Tramadol 50 mg 2 tablets q8h as needed #180/month for her fibromyalgia pain. She was also prescribed Cymbalta 60 mg daily 4 weeks ago, but has only started taking this a few days ago.  She reports the Tramadol does provide some relief of her pain. We will continue with her current medications and if Cymbalta does provide further benefit, will consider tapering down her Tramadol.

## 2016-09-27 NOTE — Assessment & Plan Note (Signed)
BP is 137/63. She was previously taking Lisinopril 10 mg but discontinued due to blood pressure drops to "70/50s." She denies any recent lightheadedness, dizziness, falls, or syncopal episodes.  Patient's systolic BP is borderline. Given she has been hypotensive on low dose Lisinopril, I will hold off on restarting an antihypertensive at this time and continue to monitor. She is diabetic but urine microalbumin/creatinine ratio 3 months ago did not suggest overt nephropathy.

## 2016-09-27 NOTE — Assessment & Plan Note (Signed)
[name] is on chronic opioid therapy for chronic pain. The date of the controlled substances contract is referenced in the [place] and / or the overview. Date of pain contract was 02/29/16. As part of the treatment plan, the [place] controlled substance database is checked at least twice yearly and the database results are appropriate. I have last reviewed the results on 09/26/16.   The last UDS was on 02/29/16 and results are as expected. Patient needs at least a yearly UDS.   The patient is chronically on: - Norco 10-325 mg 1 tablet TID as needed #90/month - Tramadol 50 mg 2 tablets q8h as needed #180/month Adjunctive treatment includes PT, Cymbalta, NSAID's, Sports Med and Muscle relaxants. This regimen allows [name] to function and does not cause excessive sedation or other side effects.  She was prescribed Oxycodone 5 mg 1-2 tabs every 4 hours prn by her surgeon for post surgical pain. She has not taken her chronic Norco while taking the Oxycodone. She does report pain relief with Oxycodone at her left shoulder but also with her chronic back pain. She is wondering if we can make a change in her chronic pain meds from Hydrocodone to Oxycodone. I have discussed the option to change her Hydrocodone/APAP to Oxycodone/APAP at 5-325 mg 2 tablets every 8 hours as needed to which she has agreed. I did discuss that I am reluctant to try higher doses given her report of intermittent somnolence. She is advised to reduce her Benadryl use as well and notify us of any side effects noticed with the medication change.  "The benefits of continuing opioid therapy outweigh the risks and chronic opioids will be continued. Ongoing education about safe opioid treatment is provided  Interventions today include: - Change Hydrocodone/APAP 10-325 mg 1 tablet TID as needed #90/month to Oxycodone/APAP Norco 5-325 mg 2 tablets TID as needed #180/month.  - prn Senokot for bowel regimen - Continue Tramadol 50 mg 2  tablets q8h as needed #180/month Refill - 1 paper Rx printed

## 2016-09-27 NOTE — Progress Notes (Signed)
Medicine attending: Medical history, presenting problems, physical findings, and medications, reviewed with resident physician Dr Vishal Patel on the day of the patient visit and I concur with his evaluation and management plan. 

## 2016-10-11 ENCOUNTER — Other Ambulatory Visit: Payer: Self-pay

## 2016-10-11 NOTE — Telephone Encounter (Signed)
sitaGLIPtin (JANUVIA) 100 MG tablet, refill request @ [facility].

## 2016-10-11 NOTE — Telephone Encounter (Signed)
Script done in dec, called pharm, they did not have record of it, gave VO

## 2016-10-24 ENCOUNTER — Ambulatory Visit (INDEPENDENT_AMBULATORY_CARE_PROVIDER_SITE_OTHER): Payer: Self-pay | Admitting: Internal Medicine

## 2016-10-24 ENCOUNTER — Encounter: Payer: Self-pay | Admitting: Internal Medicine

## 2016-10-24 ENCOUNTER — Other Ambulatory Visit: Payer: Self-pay

## 2016-10-24 VITALS — BP 144/85 | HR 77 | Temp 98.0°F | Ht 62.0 in | Wt 225.7 lb

## 2016-10-24 DIAGNOSIS — R252 Cramp and spasm: Secondary | ICD-10-CM

## 2016-10-24 DIAGNOSIS — Z79891 Long term (current) use of opiate analgesic: Secondary | ICD-10-CM

## 2016-10-24 DIAGNOSIS — M797 Fibromyalgia: Secondary | ICD-10-CM

## 2016-10-24 DIAGNOSIS — E1140 Type 2 diabetes mellitus with diabetic neuropathy, unspecified: Secondary | ICD-10-CM

## 2016-10-24 DIAGNOSIS — M720 Palmar fascial fibromatosis [Dupuytren]: Secondary | ICD-10-CM

## 2016-10-24 DIAGNOSIS — M2550 Pain in unspecified joint: Secondary | ICD-10-CM

## 2016-10-24 DIAGNOSIS — E119 Type 2 diabetes mellitus without complications: Secondary | ICD-10-CM

## 2016-10-24 DIAGNOSIS — Z79899 Other long term (current) drug therapy: Secondary | ICD-10-CM

## 2016-10-24 DIAGNOSIS — G8929 Other chronic pain: Secondary | ICD-10-CM

## 2016-10-24 DIAGNOSIS — Z8739 Personal history of other diseases of the musculoskeletal system and connective tissue: Secondary | ICD-10-CM

## 2016-10-24 DIAGNOSIS — M722 Plantar fascial fibromatosis: Secondary | ICD-10-CM

## 2016-10-24 DIAGNOSIS — Z794 Long term (current) use of insulin: Secondary | ICD-10-CM

## 2016-10-24 DIAGNOSIS — I10 Essential (primary) hypertension: Secondary | ICD-10-CM

## 2016-10-24 DIAGNOSIS — G2581 Restless legs syndrome: Secondary | ICD-10-CM

## 2016-10-24 DIAGNOSIS — M5126 Other intervertebral disc displacement, lumbar region: Secondary | ICD-10-CM

## 2016-10-24 DIAGNOSIS — M549 Dorsalgia, unspecified: Secondary | ICD-10-CM

## 2016-10-24 LAB — GLUCOSE, CAPILLARY: Glucose-Capillary: 219 mg/dL — ABNORMAL HIGH (ref 65–99)

## 2016-10-24 LAB — POCT GLYCOSYLATED HEMOGLOBIN (HGB A1C): Hemoglobin A1C: 7.3

## 2016-10-24 MED ORDER — OXYCODONE-ACETAMINOPHEN 5-325 MG PO TABS: 2.0000 | ORAL_TABLET | Freq: Three times a day (TID) | ORAL | 0 refills | Status: DC | PRN

## 2016-10-24 MED ORDER — TRAMADOL HCL 50 MG PO TABS: 100.0000 mg | ORAL_TABLET | Freq: Three times a day (TID) | ORAL | 0 refills | Status: DC | PRN

## 2016-10-24 MED ORDER — INSULIN GLARGINE 100 UNIT/ML ~~LOC~~ SOLN: SUBCUTANEOUS | 11 refills | Status: DC

## 2016-10-24 MED ORDER — PREGABALIN 75 MG PO CAPS: 75.0000 mg | ORAL_CAPSULE | Freq: Two times a day (BID) | ORAL | 2 refills | Status: DC

## 2016-10-24 NOTE — Progress Notes (Signed)
CC: T2DM  HPI:  [name] is a 56 y.o. female with PMH as listed below including T2DM, chronic pain, HTN, and fibromyalgia who presents for follow up management of her T2DM, HTN, and Chronic Pain as well as complaint of restless legs at night.  T2DM: Patient is currently taking Lantus 35 units qhs and Januvia 100 mg daily. Her last Hgb A1c was 8.3 on 07/18/16. She did not bring her meter and admits infrequently checking her CBGs. She denies any symptomatic hypo/hyperglycemic episodes.  HTN: Patient was previously on Lisinopril 10 mg daily however self-discontinued this due to reported episodes of symptomatic hypotension to the "70/50s." BP on arrival this visit was 161/75 with repeat of 144/85.  Chronic Pain and Fibromyalgia: Patient with difficult to control chronic pain secondary to fibromyalgia, MSK joint pain, and L4-5 disc protrusion. She has Sports Medicine, Orthopedics, Plastic Surgery, and Podiatry for her MSK pain, adhesive capsulitis, small rotator cuff tear of her left shoulder, Dupuytren's contractures, and plantar fasciitis.   She is s/p left shoulder arthroscopy with rotator cuff repair on 09/18/2016. She reported improved pain relief when taking Oxycodone/APAP after her surgery (while holding off on taking her usual Hydrocodone/APAP). On her last visit, we made a change in her chronic pain meds from Hydrocodone/APAP to Oxycodone/APAP as follows: - Changed Hydrocodone/APAP 10-325 mg 1 tablet TID as needed #90/month to Oxycodone/APAP Norco 5-325 mg 2 tablets TID as needed #180/month.   Her Tramadol 50 mg 2 tablets q8h as needed #180/month was continued for her fibromyalgia pain in addition to Cymbalta.  She has reported some somnolence after taking the Oxycodone which she attributes to frequent Benadryl use and suspected underlying sleep apnea. She reports regular bowel movements, 2-3 per day.  Leg Cramping: Patient reports recent sensation of her legs cramping at  night with the feeling like she has a pinched nerve in her back. She has a tingling sensation associated with this. The cramping begins after she rests for a while. She feels like she needs to walk around or massage her legs for relief. She says "99.9% of the time it impacts her left leg. She finds mild relief with Tramadol.   Past Medical History:  Diagnosis Date  . 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 Arthritis   . Diabetes mellitus    oral tx  . GERD (gastroesophageal reflux disease)   . History of post-sterilization tuboplasty 2000  . Plantar fasciitis    Right  . Shortness of breath    with exertion  . Sleep apnea 5 plus yrs   study -pt could not sleep test inconclusive.   . Tear of meniscus of left knee    x2  . Tear of meniscus of right knee     Review of Systems:   Review of Systems  Constitutional: Negative for chills, diaphoresis and fever.  Respiratory: Negative for shortness of breath.   Cardiovascular: Negative for chest pain and leg swelling.  Musculoskeletal: Positive for back pain and joint pain. Negative for falls.  Neurological: Positive for tingling.       Leg cramps at night     Physical Exam:  Vitals:   10/24/16 1434 10/24/16 1521  BP: (!) 161/75 (!) 144/85  Pulse: 77 77  Temp: 98 F (36.7 C)   TempSrc: Oral   SpO2: 100%   Weight: 225 lb 11.2 oz (102.4 kg)   Height: 5\' 2"  (1.575 m)    Physical Exam  Constitutional: She is oriented to person, place, and time. She appears well-developed and well-nourished. No distress.  HENT:  Head: Normocephalic and atraumatic.  Cardiovascular: Normal rate and regular rhythm.   Pulses:      Dorsalis pedis pulses are 2+ on the right side, and 2+ on the left side.       Posterior tibial pulses are 2+ on the right side, and 2+ on the left side.  Pulmonary/Chest: Effort normal. No respiratory distress. She has no wheezes. She has no rales.  Musculoskeletal:        Lumbar back: She exhibits tenderness.  Neurological: She is alert and oriented to person, place, and time.  Skin: Skin is warm. She is not diaphoretic.    Assessment & Plan:   See Encounters Tab for problem based charting.  Patient discussed with Dr. Dareen Piano  Type 2 diabetes mellitus without complication, with long-term current use of insulin ([place]) Repeat Hgb A1c this visit is 7.3, showing improved glycemic control. Will continue her current medications. - Continue Lantus 35 units qhs - Continue Januvia 100 mg daily - Repeat A1c in 3 months  Essential hypertension Her repeat blood pressure is mildly elevated at 144/85. If still elevated on follow up, will consider adding back anti-hypertensive medications starting with a low dose ARB.   Fibromyalgia Patient currently taking Tramadol 50 mg 2 tablets q8h as needed #180/month for her fibromyalgia pain in addition to Cymbalta 60 mg daily. She reports fair control at this time. I would like to eventually come down on her Tramadol if Cymbalta is providing relief. Lyrica has been added to possibly provide relief with her neuropathy, fibromyalgia, and possible restless leg syndrome.  Long-term current use of opiate analgesic [name] is on chronic opioid therapy for chronic pain. The date of the controlled substances contract is referenced in the [place] and / or the overview. Date of pain contract was 02/29/16. As part of the treatment plan, the [place] controlled substance database is checked at least twice yearly and the database results are appropriate. I have last reviewed the results on 10/24/16.   The last UDS was on 02/29/16 and results are as expected. Patient needs at least a yearly UDS.   The patient is on oxycodone/acetaminophen (Percocet, Tylox) and tramadol (Ultram) strength Oxycodone/APAP Norco 5-325 mg 2 tablets TID as needed #180/month and Tramadol 50 mg 2 tablets q8h as needed #180/month. Adjunctive treatment includes PT,  Cymbalta, NSAID's, Sports Med and Muscle relaxants. This regimen allows [facility] to function however I am concerned about possible increased somnolence from sedative effects.  "The benefits of continuing opioid therapy outweigh the risks and chronic opioids will be continued. Ongoing education about safe opioid treatment is provided  Interventions this visit include: Refills - 3 paper Rx printed  Patient to call for increased somnolence. She should reduce the amount of benadryl she uses.     Restless legs syndrome (RLS) Patient's leg cramping at night may be due to restless leg syndrome. Non-fasting Ferritin checked this visit is 35 suggesting iron deficiency may be related to her symptoms. A fasting serum ferritin <75 would be an indication for iron replacement therapy. BMET this visit shows a normal potassium of 4.5. Lyrica has been added on to help relieve her current symptoms in addition to her neuropathy and fibromyalgia pain. - Check fasting serum ferritin on follow up, replace as indicated

## 2016-10-24 NOTE — Patient Instructions (Addendum)
It was a pleasure to see you again [name].  Your Hemoglobin A1c is 7.3 today, you are doing a good job!  Continue your Lantus and Januvia as prescribed.  We will try Lyrica for your back pain and leg cramping. We are also checking blood work.  Please follow up with me again in 3 months or sooner if needed.  Pregabalin capsules What is this medicine? PREGABALIN (pre GAB a lin) is used to treat nerve pain from diabetes, shingles, spinal cord injury, and fibromyalgia. It is also used to control seizures in epilepsy. This medicine may be used for other purposes; ask your health care provider or pharmacist if you have questions. COMMON BRAND NAME(S): Lyrica What should I tell my health care provider before I take this medicine? They need to know if you have any of these conditions: -bleeding problems -heart disease, including heart failure -history of alcohol or drug abuse -kidney disease -suicidal thoughts, plans, or attempt; a previous suicide attempt by you or a family member -an unusual or allergic reaction to pregabalin, gabapentin, other medicines, foods, dyes, or preservatives -pregnant or trying to get pregnant or trying to conceive with your partner -breast-feeding How should I use this medicine? Take this medicine by mouth with a glass of water. Follow the directions on the prescription label. You can take this medicine with or without food. Take your doses at regular intervals. Do not take your medicine more often than directed. Do not stop taking except on your doctor's advice. A special MedGuide will be given to you by the pharmacist with each prescription and refill. Be sure to read this information carefully each time. Talk to your pediatrician regarding the use of this medicine in children. Special care may be needed. Overdosage: If you think you have taken too much of this medicine contact a poison control center or emergency room at once. NOTE: This medicine is only  for you. Do not share this medicine with others. What if I miss a dose? If you miss a dose, take it as soon as you can. If it is almost time for your next dose, take only that dose. Do not take double or extra doses. What may interact with this medicine? -alcohol -certain medicines for blood pressure like captopril, enalapril, or lisinopril -certain medicines for diabetes, like pioglitazone or rosiglitazone -certain medicines for anxiety or sleep -narcotic medicines for pain This list may not describe all possible interactions. Give your health care provider a list of all the medicines, herbs, non-prescription drugs, or dietary supplements you use. Also tell them if you smoke, drink alcohol, or use illegal drugs. Some items may interact with your medicine. What should I watch for while using this medicine? Tell your doctor or healthcare professional if your symptoms do not start to get better or if they get worse. Visit your doctor or health care professional for regular checks on your progress. Do not stop taking except on your doctor's advice. You may develop a severe reaction. Your doctor will tell you how much medicine to take. Wear a medical identification bracelet or chain if you are taking this medicine for seizures, and carry a card that describes your disease and details of your medicine and dosage times. You may get drowsy or dizzy. Do not drive, use machinery, or do anything that needs mental alertness until you know how this medicine affects you. Do not stand or sit up quickly, especially if you are an older patient. This reduces the risk of  dizzy or fainting spells. Alcohol may interfere with the effect of this medicine. Avoid alcoholic drinks. If you have a heart condition, like congestive heart failure, and notice that you are retaining water and have swelling in your hands or feet, contact your health care provider immediately. The use of this medicine may increase the chance of  suicidal thoughts or actions. Pay special attention to how you are responding while on this medicine. Any worsening of mood, or thoughts of suicide or dying should be reported to your health care professional right away. This medicine has caused reduced sperm counts in some men. This may interfere with the ability to father a child. You should talk to your doctor or health care professional if you are concerned about your fertility. Women who become pregnant while using this medicine for seizures may enroll in the [place] Pregnancy Registry by calling [phone]. This registry collects information about the safety of antiepileptic drug use during pregnancy. What side effects may I notice from receiving this medicine? Side effects that you should report to your doctor or health care professional as soon as possible: -allergic reactions like skin rash, itching or hives, swelling of the face, lips, or tongue -breathing problems -changes in vision -chest pain -confusion -jerking or unusual movements of any part of your body -loss of memory -muscle pain, tenderness, or weakness -suicidal thoughts or other mood changes -swelling of the ankles, feet, hands -unusual bruising or bleeding Side effects that usually do not require medical attention (report to your doctor or health care professional if they continue or are bothersome): -dizziness -drowsiness -dry mouth -headache -nausea -tremors -trouble sleeping -weight gain This list may not describe all possible side effects. Call your doctor for medical advice about side effects. You may report side effects to FDA at 1-800-FDA-1088. Where should I keep my medicine? Keep out of the reach of children. This medicine can be abused. Keep your medicine in a safe place to protect it from theft. Do not share this medicine with anyone. Selling or giving away this medicine is dangerous and against the law. This medicine may  cause accidental overdose and death if it taken by other adults, children, or pets. Mix any unused medicine with a substance like cat litter or coffee grounds. Then throw the medicine away in a sealed container like a sealed bag or a coffee can with a lid. Do not use the medicine after the expiration date. Store at room temperature between 15 and 30 degrees C (59 and 86 degrees F). NOTE: This sheet is a summary. It may not cover all possible information. If you have questions about this medicine, talk to your doctor, pharmacist, or health care provider.  2018 Elsevier/Gold Standard (2015-08-04 10:26:12)

## 2016-10-24 NOTE — Telephone Encounter (Signed)
oxyCODONE-acetaminophen (PERCOCET/ROXICET) 5-325 MG tablet, traMADol (ULTRAM) 50 MG tablet, refill request.

## 2016-10-25 LAB — BMP8+ANION GAP
Anion Gap: 15 mmol/L (ref 10.0–18.0)
BUN/Creatinine Ratio: 12 (ref 9–23)
BUN: 7 mg/dL (ref 6–24)
CO2: 24 mmol/L (ref 18–29)
Calcium: 9.7 mg/dL (ref 8.7–10.2)
Chloride: 101 mmol/L (ref 96–106)
Creatinine, Ser: 0.6 mg/dL (ref 0.57–1.00)
GFR calc Af Amer: 120 mL/min/{1.73_m2} (ref >59)
GFR calc non Af Amer: 104 mL/min/{1.73_m2} (ref >59)
Glucose: 183 mg/dL — ABNORMAL HIGH (ref 65–99)
Potassium: 4.5 mmol/L (ref 3.5–5.2)
Sodium: 140 mmol/L (ref 134–144)

## 2016-10-25 LAB — FERRITIN: Ferritin: 35 ng/mL (ref 15–150)

## 2016-10-26 ENCOUNTER — Telehealth: Payer: Self-pay | Admitting: Internal Medicine

## 2016-10-26 NOTE — Telephone Encounter (Signed)
Wants to talk to nurse °

## 2016-10-26 NOTE — Telephone Encounter (Signed)
Called pt - stated Percocet (rx written 4/11) makes her too sleepy,unable to do her activities. She wants to be changed back to Hydrocodone; and will bring Percocet rxs back to the clinic. Also stated she forget to mention at last visit, she stopped taking Celebrex b/c it made her feel like she did not want to get out of bed; unable to help her husband. I did not see Celebrex on med list - I asked if she was  Sure this is correct med, stated "Yes'.

## 2016-10-27 NOTE — Assessment & Plan Note (Signed)
Patient currently taking Tramadol 50 mg 2 tablets q8h as needed #180/month for her fibromyalgia pain in addition to Cymbalta 60 mg daily. She reports fair control at this time. I would like to eventually come down on her Tramadol if Cymbalta is providing relief. Lyrica has been added to possibly provide relief with her neuropathy, fibromyalgia, and possible restless leg syndrome.

## 2016-10-27 NOTE — Telephone Encounter (Signed)
I attempted to call patient on both listed numbers without answer or option to leave voicemail. She should reduce her Percocet from 2 tablets to 1 tablet if she is becoming too sleepy. She has also previously reported taking Benadryl frequently which she should limit as that can contribute to her sedation. If she is requesting a change back to Hydrocodone, she will need an appointment with me at which time she should bring her current medication bottles and pills as well as her printed prescriptions.  I do not see Celebrex on her medication list and she is not prescribed Celebrex by me.

## 2016-10-27 NOTE — Assessment & Plan Note (Signed)
Patient's leg cramping at night may be due to restless leg syndrome. Non-fasting Ferritin checked this visit is 35 suggesting iron deficiency may be related to her symptoms. A fasting serum ferritin <75 would be an indication for iron replacement therapy. BMET this visit shows a normal potassium of 4.5. Lyrica has been added on to help relieve her current symptoms in addition to her neuropathy and fibromyalgia pain. - Check fasting serum ferritin on follow up, replace as indicated

## 2016-10-27 NOTE — Assessment & Plan Note (Signed)
Her repeat blood pressure is mildly elevated at 144/85. If still elevated on follow up, will consider adding back anti-hypertensive medications starting with a low dose ARB.

## 2016-10-27 NOTE — Assessment & Plan Note (Signed)
Jacaria Colburn is on chronic opioid therapy for chronic pain. The date of the controlled substances contract is referenced in the [place] and / or the overview. Date of pain contract was 02/29/16. As part of the treatment plan, the [place] controlled substance database is checked at least twice yearly and the database results are appropriate. I have last reviewed the results on 10/24/16.   The last UDS was on 02/29/16 and results are as expected. Patient needs at least a yearly UDS.   The patient is on oxycodone/acetaminophen (Percocet, Tylox) and tramadol (Ultram) strength Oxycodone/APAP Norco 5-325 mg 2 tablets TID as needed #180/month and Tramadol 50 mg 2 tablets q8h as needed #180/month. Adjunctive treatment includes PT, Cymbalta, NSAID's, Sports Med and Muscle relaxants. This regimen allows [facility] to function however I am concerned about possible increased somnolence from sedative effects.  "The benefits of continuing opioid therapy outweigh the risks and chronic opioids will be continued. Ongoing education about safe opioid treatment is provided  Interventions this visit include: Refills - 3 paper Rx printed  Patient to call for increased somnolence. She should reduce the amount of benadryl she uses.

## 2016-10-27 NOTE — Assessment & Plan Note (Signed)
Repeat Hgb A1c this visit is 7.3, showing improved glycemic control. Will continue her current medications. - Continue Lantus 35 units qhs - Continue Januvia 100 mg daily - Repeat A1c in 3 months

## 2016-10-29 NOTE — Telephone Encounter (Signed)
Thanks [name]! 

## 2016-10-29 NOTE — Telephone Encounter (Signed)
Pt called / informed "She should reduce her Percocet from 2 tablets to 1 tablet if she is becoming too sleepy. She has also previously reported taking Benadryl frequently which she should limit as that can contribute to her sedation. If she is requesting a change back to Hydrocodone, she will need an appointment with me at which time she should bring her current medication bottles and pills as well as her printed prescriptions." per Dr Posey Pronto. Stated she has been taking 1 tab every 4 hrs during the day and 2 in the evening/nights - and this seem to be working.

## 2016-10-30 NOTE — Progress Notes (Signed)
Internal Medicine Clinic Attending  Case discussed with Dr. Patel,Vishal at the time of the visit.  We reviewed the resident's history and exam and pertinent patient test results.  I agree with the assessment, diagnosis, and plan of care documented in the resident's note.  

## 2016-11-05 ENCOUNTER — Telehealth: Payer: Self-pay | Admitting: *Deleted

## 2016-11-05 MED ORDER — MELOXICAM 7.5 MG PO TABS: 7.5000 mg | ORAL_TABLET | Freq: Two times a day (BID) | ORAL | 1 refills | Status: DC | PRN

## 2016-11-05 MED FILL — traMADol HCL 50 MG TABS: 50 | 30 days supply | Qty: 180 | Fill #0

## 2016-11-05 MED FILL — OXYCODONE W/APAP 5/325 TAB: 5-325 | 30 days supply | Qty: 180 | Fill #0

## 2016-11-05 MED FILL — MELOXICAM 7.5 MG TABLET: 7.5 | 30 days supply | Qty: 60 | Fill #0

## 2016-11-05 NOTE — Telephone Encounter (Signed)
Creatinine is OK

## 2016-11-08 ENCOUNTER — Ambulatory Visit: Payer: Self-pay

## 2016-11-12 ENCOUNTER — Ambulatory Visit (INDEPENDENT_AMBULATORY_CARE_PROVIDER_SITE_OTHER): Payer: Medicaid Other | Admitting: Internal Medicine

## 2016-11-12 ENCOUNTER — Encounter (INDEPENDENT_AMBULATORY_CARE_PROVIDER_SITE_OTHER): Payer: Self-pay

## 2016-11-12 VITALS — BP 156/76 | HR 85 | Temp 97.7°F | Ht 62.0 in | Wt 228.7 lb

## 2016-11-12 DIAGNOSIS — R252 Cramp and spasm: Secondary | ICD-10-CM | POA: Diagnosis not present

## 2016-11-12 DIAGNOSIS — G2581 Restless legs syndrome: Secondary | ICD-10-CM

## 2016-11-12 MED ORDER — FERROUS SULFATE 325 (65 FE) MG PO TABS: 325.0000 mg | ORAL_TABLET | Freq: Every day | ORAL | 3 refills | Status: DC

## 2016-11-12 NOTE — Patient Instructions (Signed)
It was a pleasure to see you again [name].  I think your leg cramping is Restless Leg Syndrome. Your iron stores were low on previous blood tests.  We will try oral iron replacement which should help. Starting Lyrica should also help with this.  Try tapering down the Tramadol once you start the Lyrica.  Please follow up with me as scheduled.  Restless Legs Syndrome Restless legs syndrome is a condition that causes uncomfortable feelings or sensations in the legs, especially while sitting or lying down. The sensations usually cause an overwhelming urge to move the legs. The arms can also sometimes be affected. The condition can range from mild to severe. The symptoms often interfere with a person's ability to sleep. What are the causes? The cause of this condition is not known. What increases the risk? This condition is more likely to develop in:  People who are older than age 60.  Pregnant women. In general, restless legs syndrome is more common in women than in men.  People who have a family history of the condition.  People who have certain medical conditions, such as iron deficiency, kidney disease, Parkinson disease, or nerve damage.  People who take certain medicines, such as medicines for high blood pressure, nausea, colds, allergies, depression, and some heart conditions. What are the signs or symptoms? The main symptom of this condition is uncomfortable sensations in the legs. These sensations may be:  Described as pulling, tingling, prickling, throbbing, crawling, or burning.  Worse while you are sitting or lying down.  Worse during periods of rest or inactivity.  Worse at night, often interfering with your sleep.  Accompanied by a very strong urge to move your legs.  Temporarily relieved by movement of your legs. The sensations usually affect both sides of the body. The arms can also be affected, but this is rare. People who have this condition often have  tiredness during the day because of their lack of sleep at night. How is this diagnosed? This condition may be diagnosed based on your description of the symptoms. You may also have tests, including blood tests, to check for other conditions that may lead to your symptoms. In some cases, you may be asked to spend some time in a sleep lab so your sleeping can be monitored. How is this treated? Treatment for this condition is focused on managing the symptoms. Treatment may include:  Self-help and lifestyle changes.  Medicines. Follow these instructions at home:  Take medicines only as directed by your health care provider.  Try these methods to get temporary relief from the uncomfortable sensations:  Massage your legs.  Walk or stretch.  Take a cold or hot bath.  Practice good sleep habits. For example, go to bed and get up at the same time every day.  Exercise regularly.  Practice ways of relaxing, such as yoga or meditation.  Avoid caffeine and alcohol.  Do not use any tobacco products, including cigarettes, chewing tobacco, or electronic cigarettes. If you need help quitting, ask your health care provider.  Keep all follow-up visits as directed by your health care provider. This is important. Contact a health care provider if: Your symptoms do not improve with treatment, or they get worse. This information is not intended to replace advice given to you by your health care provider. Make sure you discuss any questions you have with your health care provider. Document Released: 06/22/2002 Document Revised: 12/08/2015 Document Reviewed: 06/28/2014 Elsevier Interactive Patient Education  2017 Elsevier  Inc.  

## 2016-11-12 NOTE — Assessment & Plan Note (Signed)
Her symptoms are consistent with restless leg syndrome. Given her Ferritin < 50 ng/mL, suspect iron deficiency as a possible cause. We will try oral iron supplementation for relief. Lyrica should also help when she is able to pick it up. If she is able to obtain the Lyrica, she will try to taper down her Tramadol. - Start Ferrous Sulfate 325 mg po daily - Lyrica 75 mg po BID when available - f/u with me on 11/21/16 as scheduled - Consider B6 complex if needed

## 2016-11-12 NOTE — Progress Notes (Signed)
   CC: Leg Cramping  HPI:  [name] is a 56 y.o. female with PMH as listed below including T2DM, chronic pain, HTN, and fibromyalgia who presents for follow up management of cramping sensation in her legs.  Leg Cramping: Patient reports a sensation of her legs cramping with the feeling like she has a pinched nerve in her back. The cramping ocurs while at rest. Initially the cramping sensation occurred mostly at night, however it is now occurring during the day as well. She feels like she needs to walk around or massage her legs for relief. The pain mostly involves the LLE, but is occurring more frequently in her RLE as well. She finds mild relief with Tramadol. Her Ferritin on 10/24/16 was 35 ng/mL. She has been prescribed Lyrica but has not been able to pick it up yet.   Past Medical History:  Diagnosis Date  . 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 Arthritis   . Diabetes mellitus    oral tx  . GERD (gastroesophageal reflux disease)   . History of post-sterilization tuboplasty 2000  . Plantar fasciitis    Right  . Shortness of breath    with exertion  . Sleep apnea 5 plus yrs   study -pt could not sleep test inconclusive.   . Tear of meniscus of left knee    x2  . Tear of meniscus of right knee     Review of Systems:   Review of Systems  Respiratory: Negative for shortness of breath.   Cardiovascular: Negative for chest pain.  Gastrointestinal: Negative for abdominal pain, blood in stool, constipation, diarrhea, melena, nausea and vomiting.  Musculoskeletal: Positive for back pain and joint pain. Negative for falls.       Leg cramping. No swelling, erythema, or focal weakness.  Neurological: Positive for tingling. Negative for loss of consciousness.     Physical Exam:  Vitals:   11/12/16 1357  BP: (!) 156/76  Pulse: 85  Temp: 97.7 F (36.5 C)  TempSrc: Oral  SpO2: 100%  Weight: 228 lb 11.2 oz (103.7 kg)    Height: 5\' 2"  (1.575 m)   Physical Exam  Constitutional: She is oriented to person, place, and time. She appears well-developed and well-nourished.  Cardiovascular: Normal rate and regular rhythm.   No murmur heard. Pulmonary/Chest: Effort normal. No respiratory distress. She has no wheezes. She has no rales.  Musculoskeletal: She exhibits no edema or tenderness.  Neurological: She is alert and oriented to person, place, and time.  Skin: Skin is warm. No rash noted. No erythema.    Assessment & Plan:   See Encounters Tab for problem based charting.  Patient discussed with Dr. Lynnae January  Restless legs syndrome (RLS) Her symptoms are consistent with restless leg syndrome. Given her Ferritin < 50 ng/mL, suspect iron deficiency as a possible cause. We will try oral iron supplementation for relief. Lyrica should also help when she is able to pick it up. If she is able to obtain the Lyrica, she will try to taper down her Tramadol. - Start Ferrous Sulfate 325 mg po daily - Lyrica 75 mg po BID when available - f/u with me on 11/21/16 as scheduled - Consider B6 complex if needed

## 2016-11-12 NOTE — Progress Notes (Signed)
Internal Medicine Clinic Attending  Case discussed with Dr. V Patel at the time of the visit.  We reviewed the resident's history and exam and pertinent patient test results.  I agree with the assessment, diagnosis, and plan of care documented in the resident's note.  

## 2016-11-14 ENCOUNTER — Other Ambulatory Visit: Payer: Self-pay | Admitting: *Deleted

## 2016-11-14 MED ORDER — DICLOFENAC SODIUM 1 % TD GEL: 2.0000 g | Freq: Four times a day (QID) | TRANSDERMAL | 3 refills | Status: DC

## 2016-11-20 ENCOUNTER — Ambulatory Visit: Payer: Medicaid Other | Admitting: Physical Therapy

## 2016-11-21 ENCOUNTER — Encounter: Payer: Self-pay | Admitting: Internal Medicine

## 2016-11-21 ENCOUNTER — Ambulatory Visit (INDEPENDENT_AMBULATORY_CARE_PROVIDER_SITE_OTHER): Payer: Medicaid Other | Admitting: Internal Medicine

## 2016-11-21 VITALS — BP 126/71 | HR 93 | Temp 98.0°F | Wt 228.6 lb

## 2016-11-21 DIAGNOSIS — M5442 Lumbago with sciatica, left side: Secondary | ICD-10-CM

## 2016-11-21 DIAGNOSIS — G8929 Other chronic pain: Secondary | ICD-10-CM | POA: Diagnosis not present

## 2016-11-21 DIAGNOSIS — M5126 Other intervertebral disc displacement, lumbar region: Secondary | ICD-10-CM | POA: Diagnosis not present

## 2016-11-21 DIAGNOSIS — I10 Essential (primary) hypertension: Secondary | ICD-10-CM | POA: Diagnosis not present

## 2016-11-21 DIAGNOSIS — M2550 Pain in unspecified joint: Secondary | ICD-10-CM | POA: Diagnosis not present

## 2016-11-21 DIAGNOSIS — Z79891 Long term (current) use of opiate analgesic: Secondary | ICD-10-CM | POA: Diagnosis not present

## 2016-11-21 DIAGNOSIS — E119 Type 2 diabetes mellitus without complications: Secondary | ICD-10-CM

## 2016-11-21 DIAGNOSIS — M797 Fibromyalgia: Secondary | ICD-10-CM | POA: Diagnosis not present

## 2016-11-21 DIAGNOSIS — R21 Rash and other nonspecific skin eruption: Secondary | ICD-10-CM

## 2016-11-21 DIAGNOSIS — R232 Flushing: Secondary | ICD-10-CM

## 2016-11-21 DIAGNOSIS — Z794 Long term (current) use of insulin: Secondary | ICD-10-CM

## 2016-11-21 DIAGNOSIS — M5441 Lumbago with sciatica, right side: Secondary | ICD-10-CM

## 2016-11-21 MED ORDER — VENLAFAXINE HCL ER 37.5 MG PO TB24: 37.5000 mg | ORAL_TABLET | Freq: Every day | ORAL | 2 refills | Status: DC

## 2016-11-21 MED ORDER — INSULIN GLARGINE 100 UNIT/ML ~~LOC~~ SOLN: SUBCUTANEOUS | 11 refills | Status: DC

## 2016-11-21 MED ORDER — SITAGLIPTIN PHOSPHATE 100 MG PO TABS: 100.0000 mg | ORAL_TABLET | Freq: Every day | ORAL | 3 refills | Status: DC

## 2016-11-21 NOTE — Progress Notes (Signed)
CC: Chronic Pain  HPI:  [name] is a 56 y.o. female with PMH as listed below including T2DM, chronic pain, HTN, and fibromyalgia who presents for follow up management of her Chronic Pain/Fibromyalgia, T2DM, HTN, hot flashes, and rash on left forearm. She is accompanied by her husband.  Chronic Pain and Fibromyalgia: Patient with difficult to control chronic pain secondary to fibromyalgia, MSK joint pain, and L4-5 disc protrusion. She has seen Sports Medicine, Orthopedics, Plastic Surgery, and Podiatry for her MSK pain, adhesive capsulitis, small rotator cuff tear of her left shoulder, Dupuytren's contractures, and plantar fasciitis.   She is s/p left shoulder arthroscopy with rotator cuff repair on 09/18/2016. In March 2018, we made a change in her chronic pain meds from Hydrocodone/APAP to Oxycodone/APAP as follows: - Changed Hydrocodone/APAP 10-325 mg 1 tablet TID as needed #90/month to Oxycodone/APAP Norco 5-325 mg 2 tablets TID as needed #180/month.   Her Tramadol 50 mg 2 tablets q8h as needed #180/month was continued for her fibromyalgia pain. She was started on Cymbalta in February 2018, however self discontinued this as she felt it made her feel more depressed and would not leave the house. She says this resolved after she stopped the Cymbalta. She has been taking Flexeril 10 mg twice daily with mild relief of muscle spasms. She also uses Voltaren gel and reports excess use of Bengay daily. She started Lyrica 75 mg BID one week ago.  She returns this visit complaining of progressively worse lower back muscle spasms. She says this is significantly interfering with her ability to sleep as she cannot lay still when her symptoms occur. She previously described cramping in her legs which improved with ambulation suspicious for restless leg syndrome. She now says the symptoms are localized to her low back with some radiation down her legs. In addition to the above medications, she has  used a heat wrap, ice, stretching exercises, physical therapy, and acupuncture without relief. She is wondering what other medications we can try for relief. She reports friends and family telling her that they use "dilaudid" or "hydromorphone" with relief.   T2DM: Patient is currently prescribed Lantus 35 units qhs and Januvia 100 mg daily. Her last Hgb A1c was 7.3 on 10/24/16. She says she ran out of her Januvia 1 month ago and was not able to have it refilled at the [facility] as she now has Medicaid. She did not call us to notify she was not able to get her medications. She reports adherence to Lantus 35 units nightly.  HTN: Patient was previously on Lisinopril 10 mg daily however self-discontinued this due to reported episodes of symptomatic hypotension to the "70/50s." BP on arrival this visit is 126/71.  Hot Flashes: Patient reports increased frequency and intensity of hot flashes with alternating hot and cold spells, worse at night. Her husband also says he has noticed an increase in her symptoms from her hot flashes.   Left Forearm Rash: Patient noticed small punctate red spots on her left forearm 4 days prior to this visit. She noticed a few spots near her left wrist and now has noticed a few more scattered spots up to her left antecubital fossa now. She denies any change in lotions, perfumes, detergents. She says she has only been outside in her family's yard and denies contact with plants or shrubbery. She does report pulling a tick off her left antecubital fossa one day prior to this visit, but no tick/spider/bug bites prior to her rash formation  as far as she is aware. She does report using multiple topical creams including Bengay and up to 5 anti-itch OTC creams. She has not noticed any swelling diffuse erythema, discharge, or open wounds in the area. She is unsure if she is having fevers due to her hot flashes.     Past Medical History:  Diagnosis Date  . Adhesive capsulitis of  right shoulder    with underlying tendinopathy rotator cuff  . Arthritis   . Diabetes mellitus    oral tx  . GERD (gastroesophageal reflux disease)   . History of post-sterilization tuboplasty 2000  . Plantar fasciitis    Right  . Shortness of breath    with exertion  . Sleep apnea 5 plus yrs   study -pt could not sleep test inconclusive.   . Tear of meniscus of left knee    x2  . Tear of meniscus of right knee     Review of Systems:   Review of Systems  Constitutional:       Hot flashes  Respiratory: Negative for cough and shortness of breath.   Cardiovascular: Negative for chest pain.  Musculoskeletal: Positive for back pain and joint pain.       Low back muscle spasms  Skin: Positive for rash.       Rash left forearm  Neurological: Negative for loss of consciousness.  Psychiatric/Behavioral: The patient has insomnia.      Physical Exam:  Vitals:   11/21/16 1458  BP: 126/71  Pulse: 93  Temp: 98 F (36.7 C)  TempSrc: Oral  SpO2: 100%  Weight: 228 lb 9.6 oz (103.7 kg)   Physical Exam  Constitutional: She is oriented to person, place, and time. She appears well-developed and well-nourished. No distress.  HENT:  Head: Normocephalic and atraumatic.  Cardiovascular: Normal rate and regular rhythm.   Pulmonary/Chest: Effort normal. No respiratory distress. She has no wheezes.  Musculoskeletal: She exhibits no edema.       Lumbar back: She exhibits tenderness. She exhibits no spasm.  Neurological: She is alert and oriented to person, place, and time.  Skin: Skin is warm. She is not diaphoretic.  Scattered scarce petechial rash left forearm without open sores, discharge, surrounding erythema, increased warmth or tenderness to palpation from the wrist to antecubital fossa. ~2 mm dark non-draining lesion left antecubital fossa.    Assessment & Plan:   See Encounters Tab for problem based charting.  Patient discussed with Dr. Dareen Piano  Long-term current use of  opiate analgesic [name] is on chronic opioid therapy for chronic pain. The date of the controlled substances contract is referenced in the [place] and / or the overview. Date of pain contract was 02/29/16. As part of the treatment plan, the [place] controlled substance database is checked at least twice yearly and the database results are appropriate. I have last reviewed the results on 11/21/16.   The last UDS was on 02/29/16 and results are as expected. Patient needs at least a yearly UDS.   The patient is on oxycodone/acetaminophen (Percocet, Tylox) and tramadol (Ultram):  -Oxycodone/APAP Norco 5-325 mg 2 tablets TID as needed #180/month -Tramadol 50 mg 2 tablets q8h as needed #180/month  Adjunctive treatment includes PT, Lyrica, Lidoderm patch, NSAID's, Sports Med, Orthopedics, and Muscle relaxants. This regimen used to allow [facility] to function, however she is reporting significant lower back pain and spasm interfering with her ability to sleep and complete ADLs. Current regimen does not cause excessive sedation or other  side effects.  "The benefits of continuing opioid therapy outweigh the risks and chronic opioids will be continued. Ongoing education about safe opioid treatment is provided  Interventions today include: Referral to Pain Management given her continued difficult to control pain despite non-pharmacologic, non-opioid, and opioid medications. She is agreeable. She understands we will continue with her current pain management without changes going forward until seen by Pain specialists.       Fibromyalgia Patient currently taking Tramadol 50 mg 2 tablets q8h as needed #180/month for her fibromyalgia pain. She self-discontinued Cymbalta 60 mg daily about 1 month ago due to feeling that it made her more depressed. She was able to obtain Lyrica 1 week ago and has been taking 75 mg BID. Her fibromyalgia is fairly controlled on above medications. Her main complaint is  lower back pain and spasms. Will continue the above medications and refer her to Pain Management.  Chronic back pain Patient has chronic back pain with prior MRI showing L4-5 disc protrusion. She has been seen by Sports Medicine and has received steroid injections with minimal-mild relief. Her main complaint is lower back muscle spasms for which she has tried heating wraps, stretching exercises, acupuncture, lidocaine patch, topical and oral NSAIDs, and Flexeril without relief. For her chronic back pain, MSK pain, and Fibromyalgia she is also taking Percocet, Tramadol, and Lyrica. I think we have maximized the medical therapy we can provide here and will refer to a pain clinic for further assessment and recommendations on management.  Type 2 diabetes mellitus without complication, with long-term current use of insulin ([place]) Patient has been out of her Januvia for about 1 month. I advised her to let us know when she has issues with medications as I am concerned we are going to have a setback in her diabetic control after finally finding improvement with her last A1c of 7.3. I refilled her Lantus and Januvia to her new pharmacy [facility] on [address].  - Continue Lantus 35 units qhs - Continue Januvia 100 mg daily - f/u in 2 months for repeat A1c  Essential hypertension BP is well-controlled this visit at 126/71. Will continue to monitor off anti-hypertensives.  Hot flashes Patient with worsening symptomatic hot flashes. She has tried conservative symptomatic measures without relief. We will try Venlafaxine 37.5 mg ER once daily for symptom relief.  Rash Patient with small punctate red petechial spots on her left forearm from her wrist to left antecubital fossa beginning about 4 days prior to the visit without obvious cause. She does report a tick bite, although this was 3 days after her rash began. I suspect she is having some reaction from the excess topical creams she is using for her chronic  pruritus and fibromyalgia pain. She does not exhibit any signs of cellulits or abscess formations. Rash is isolated to her left forearm without involvement of her palms. I have recommended warm compresses and limiting topical lotions/creams/gels. She is advised on return precautions (fevers, chills, diaphoresis, worsening rash, erythema, drainage, swelling) for reevaluation.

## 2016-11-21 NOTE — Patient Instructions (Addendum)
It was a pleasure to see you again Ms. Feher.  I am sorry that your back pain is not well controlled.  I am going to refer you to a pain specialist to help with your comfort.  We will try a medication called Venlafaxine for your hot flashes. This will take at least 4 weeks for effect.  Try Vitamin B6 complex for your spasms as well.  I have sent your Lantus and Januvia to your new pharmacy.  For your left arm rash, try to avoid excess topical creams. Try warm compresses and cetaphil lotion. Please call us if there is significantly worsening changes, fevers, chills, or drenching sweats.  Please follow up with me in 2 months or sooner if needed.

## 2016-11-22 DIAGNOSIS — R21 Rash and other nonspecific skin eruption: Secondary | ICD-10-CM | POA: Insufficient documentation

## 2016-11-22 NOTE — Assessment & Plan Note (Signed)
Patient currently taking Tramadol 50 mg 2 tablets q8h as needed #180/month for her fibromyalgia pain. She self-discontinued Cymbalta 60 mg daily about 1 month ago due to feeling that it made her more depressed. She was able to obtain Lyrica 1 week ago and has been taking 75 mg BID. Her fibromyalgia is fairly controlled on above medications. Her main complaint is lower back pain and spasms. Will continue the above medications and refer her to Pain Management.

## 2016-11-22 NOTE — Assessment & Plan Note (Signed)
Patient has been out of her Januvia for about 1 month. I advised her to let us know when she has issues with medications as I am concerned we are going to have a setback in her diabetic control after finally finding improvement with her last A1c of 7.3. I refilled her Lantus and Januvia to her new pharmacy [facility] on [address].  - Continue Lantus 35 units qhs - Continue Januvia 100 mg daily - f/u in 2 months for repeat A1c

## 2016-11-22 NOTE — Assessment & Plan Note (Signed)
Patient has chronic back pain with prior MRI showing L4-5 disc protrusion. She has been seen by Sports Medicine and has received steroid injections with minimal-mild relief. Her main complaint is lower back muscle spasms for which she has tried heating wraps, stretching exercises, acupuncture, lidocaine patch, topical and oral NSAIDs, and Flexeril without relief. For her chronic back pain, MSK pain, and Fibromyalgia she is also taking Percocet, Tramadol, and Lyrica. I think we have maximized the medical therapy we can provide here and will refer to a pain clinic for further assessment and recommendations on management.

## 2016-11-22 NOTE — Assessment & Plan Note (Signed)
BP is well-controlled this visit at 126/71. Will continue to monitor off anti-hypertensives.

## 2016-11-22 NOTE — Assessment & Plan Note (Signed)
Patient with worsening symptomatic hot flashes. She has tried conservative symptomatic measures without relief. We will try Venlafaxine 37.5 mg ER once daily for symptom relief.

## 2016-11-22 NOTE — Progress Notes (Signed)
Internal Medicine Clinic Attending  Case discussed with Dr. Patel,Vishal at the time of the visit.  We reviewed the resident's history and exam and pertinent patient test results.  I agree with the assessment, diagnosis, and plan of care documented in the resident's note.  

## 2016-11-22 NOTE — Assessment & Plan Note (Signed)
Patient with small punctate red petechial spots on her left forearm from her wrist to left antecubital fossa beginning about 4 days prior to the visit without obvious cause. She does report a tick bite, although this was 3 days after her rash began. I suspect she is having some reaction from the excess topical creams she is using for her chronic pruritus and fibromyalgia pain. She does not exhibit any signs of cellulits or abscess formations. Rash is isolated to her left forearm without involvement of her palms. I have recommended warm compresses and limiting topical lotions/creams/gels. She is advised on return precautions (fevers, chills, diaphoresis, worsening rash, erythema, drainage, swelling) for reevaluation.

## 2016-11-22 NOTE — Assessment & Plan Note (Signed)
[name] is on chronic opioid therapy for chronic pain. The date of the controlled substances contract is referenced in the [place] and / or the overview. Date of pain contract was 02/29/16. As part of the treatment plan, the [place] controlled substance database is checked at least twice yearly and the database results are appropriate. I have last reviewed the results on 11/21/16.   The last UDS was on 02/29/16 and results are as expected. Patient needs at least a yearly UDS.   The patient is on oxycodone/acetaminophen (Percocet, Tylox) and tramadol (Ultram):  -Oxycodone/APAP Norco 5-325 mg 2 tablets TID as needed #180/month -Tramadol 50 mg 2 tablets q8h as needed #180/month  Adjunctive treatment includes PT, Lyrica, Lidoderm patch, NSAID's, Sports Med, Orthopedics, and Muscle relaxants. This regimen used to allow [facility] to function, however she is reporting significant lower back pain and spasm interfering with her ability to sleep and complete ADLs. Current regimen does not cause excessive sedation or other side effects.  "The benefits of continuing opioid therapy outweigh the risks and chronic opioids will be continued. Ongoing education about safe opioid treatment is provided  Interventions today include: Referral to Pain Management given her continued difficult to control pain despite non-pharmacologic, non-opioid, and opioid medications. She is agreeable. She understands we will continue with her current pain management without changes going forward until seen by Pain specialists.

## 2016-11-29 ENCOUNTER — Ambulatory Visit: Payer: Medicaid Other | Attending: Physician Assistant | Admitting: Physical Therapy

## 2016-11-29 DIAGNOSIS — M25512 Pain in left shoulder: Secondary | ICD-10-CM

## 2016-11-29 DIAGNOSIS — M25612 Stiffness of left shoulder, not elsewhere classified: Secondary | ICD-10-CM | POA: Insufficient documentation

## 2016-11-29 NOTE — Therapy (Signed)
[place], [place], [address] Phone: [phone]   Fax:  [phone]  Physical Therapy Treatment  Patient Details  Name: [name] MRN: [id number] Date of Birth: 05/14/1961 Referring Provider: Dava Najjar   Encounter Date: 11/29/2016    Past Medical History:  Diagnosis Date  . 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 Arthritis   . Diabetes mellitus    oral tx  . GERD (gastroesophageal reflux disease)   . History of post-sterilization tuboplasty 2000  . Plantar fasciitis    Right  . Shortness of breath    with exertion  . Sleep apnea 5 plus yrs   study -pt could not sleep test inconclusive.   . Tear of meniscus of left knee    x2  . Tear of meniscus of right knee     Past Surgical History:  Procedure Laterality Date  . CHOLECYSTECTOMY    . FOOT TENDON SURGERY    . KNEE ARTHROSCOPY  10/10/2011   Procedure: ARTHROSCOPY KNEE;  Surgeon: Newt Minion, MD;  Location: [facility];  Service: Orthopedics;  Laterality: Right;  Right Knee Arthroscopy and Excision Medial Meniscus  . MENISCUS REPAIR     R and L (L x2)  . TONSILLECTOMY    . TUBAL LIGATION      There were no vitals filed for this visit.                                           Plan - 11/29/16 1358    Clinical Impression Statement Patient states that she does not belive she needs Physcials therapy at this time, She reports she has very little pain and demsotrated full active ROM. She has [employer] and will only have 1 evaluation covered this year. She reports she plans on having a toltal knee replacement and maybe a back surgery so she would like to save her visit. Shefeels comfortable with her current HEP.    PT Frequency Other (comment)      Patient will benefit from skilled therapeutic intervention in order to improve the following deficits and  impairments:     Visit Diagnosis: Acute pain of left shoulder  Stiffness of left shoulder, not elsewhere classified     Problem List Patient Active Problem List   Diagnosis Date Noted  . Rash 11/22/2016  . Restless legs syndrome (RLS) 10/24/2016  . Long-term current use of opiate analgesic 03/01/2016  . Hot flashes 12/29/2015  . S/P left rotator cuff repair 12/09/2015  . Family history of breast cancer 11/29/2015  . Acromioclavicular joint arthritis 09/19/2015  . Essential hypertension 12/17/2014  . Pruritus 01/18/2014  . Sesamoiditis thumb IP joint 12/11/2013  . Chronic back pain 06/15/2013  . Tenosynovitis of hand or wrist 03/13/2013  . Dysmenorrhea 12/02/2012  . Insomnia 12/02/2012  . Fibromyalgia 05/23/2012  . Preventative health care 03/01/2011  . Type 2 diabetes mellitus without complication, with long-term current use of insulin ([facility]) 07/26/2010  . DUPUYTREN'S CONTRACTURE, RIGHT 10/19/2009  . Plantar fascial fibromatosis 09/06/2006  . ADHESIVE CAPSULITIS, RIGHT 09/04/2006  . Other tear of cartilage or meniscus of knee, current 09/04/2006    Carney Living PT DPT  11/29/2016, 2:02 PM  [facility] [address] [place], [place], [address] Phone: [phone]   Fax:  [phone]  Name: [name] MRN: [id number] Date of Birth: 20-Apr-1961

## 2016-12-05 ENCOUNTER — Other Ambulatory Visit: Payer: Self-pay

## 2016-12-05 DIAGNOSIS — E1165 Type 2 diabetes mellitus with hyperglycemia: Secondary | ICD-10-CM

## 2016-12-05 DIAGNOSIS — Z794 Long term (current) use of insulin: Principal | ICD-10-CM

## 2016-12-05 DIAGNOSIS — IMO0002 Reserved for concepts with insufficient information to code with codable children: Secondary | ICD-10-CM

## 2016-12-05 DIAGNOSIS — E118 Type 2 diabetes mellitus with unspecified complications: Principal | ICD-10-CM

## 2016-12-05 MED ORDER — CYCLOBENZAPRINE HCL 10 MG PO TABS: 10.0000 mg | ORAL_TABLET | Freq: Two times a day (BID) | ORAL | 4 refills | Status: DC

## 2016-12-05 MED ORDER — MELOXICAM 7.5 MG PO TABS: 7.5000 mg | ORAL_TABLET | Freq: Two times a day (BID) | ORAL | 4 refills | Status: DC | PRN

## 2016-12-05 MED ORDER — "INSULIN SYRINGE-NEEDLE U-100 31G X 15/64"" 0.3 ML MISC": 3 refills | Status: DC

## 2016-12-05 NOTE — Telephone Encounter (Signed)
meloxicam (MOBIC) 7.5 MG tablet,   cyclobenzaprine (FLEXERIL) 10 MG tablet,  Syringes for insulin. Please call pt back.   [facility] @ [facility] on [address].

## 2016-12-06 ENCOUNTER — Telehealth: Payer: Self-pay

## 2016-12-06 DIAGNOSIS — Z79891 Long term (current) use of opiate analgesic: Secondary | ICD-10-CM

## 2016-12-06 NOTE — Telephone Encounter (Signed)
Pt called back in, informed her that imc was awaiting the answer, she will be informed when imc receives the answer

## 2016-12-06 NOTE — Telephone Encounter (Signed)
[name], pharm states they need a PA for this because it is a 30 day supply

## 2016-12-06 NOTE — Telephone Encounter (Signed)
Logged in [place] [facility]: Decision still pending review  [phone] W  PHARMACY [phone] K Mckay Ziemann  12/06/2016 PENDING 01/01/0001 - 01/01/0001 DMA

## 2016-12-06 NOTE — Telephone Encounter (Signed)
As narcotic PA are no longer done over the phone, request was submitted online via [place] [facility].  Request was sent for review.[name]5/24/20182:35 PM  [phone] W PHARMACY  [phone] K Masako Marcus  12/06/2016 SUSPENDED DMA Decision-Pending Review

## 2016-12-06 NOTE — Telephone Encounter (Signed)
Requesting PA on oxyCODONE-acetaminophen (PERCOCET/ROXICET) 5-325 MG tablet. Pt would like a call back.

## 2016-12-07 ENCOUNTER — Encounter: Payer: Self-pay | Admitting: Family Medicine

## 2016-12-07 ENCOUNTER — Ambulatory Visit (INDEPENDENT_AMBULATORY_CARE_PROVIDER_SITE_OTHER): Payer: Medicaid Other | Admitting: Family Medicine

## 2016-12-07 VITALS — BP 156/77 | Ht 62.0 in | Wt 210.0 lb

## 2016-12-07 DIAGNOSIS — G8929 Other chronic pain: Secondary | ICD-10-CM

## 2016-12-07 DIAGNOSIS — M545 Low back pain, unspecified: Secondary | ICD-10-CM

## 2016-12-07 DIAGNOSIS — M7742 Metatarsalgia, left foot: Secondary | ICD-10-CM

## 2016-12-07 DIAGNOSIS — M7741 Metatarsalgia, right foot: Secondary | ICD-10-CM | POA: Diagnosis not present

## 2016-12-07 MED ORDER — METHYLPREDNISOLONE ACETATE 40 MG/ML IJ SUSP
40.0000 mg | Freq: Once | INTRAMUSCULAR | Status: AC
  Administered 2016-12-07: 40 mg via INTRA_ARTICULAR

## 2016-12-07 NOTE — Progress Notes (Signed)
[facility] : Medical Student. I have seen and examined the patient, reviewed the chart. I personally performed pertinent portions of the history, chart review, physical, evaluation of the available data and imaging. I have amended the medical student's note with corrections and additional information as above. I personally performed any procedures. Dorcas Mcmurray MD

## 2016-12-07 NOTE — Telephone Encounter (Signed)
[id number] [phone] W  PHARMACY [phone] K  [name] 12/06/2016 APPROVED 12/06/2016 - 06/04/2017 DMA

## 2016-12-07 NOTE — Progress Notes (Signed)
Subjective:     Patient ID: [name], female   DOB: [birth year], 56 y.o.   MRN: [id number]  HPI Patient is a 56 year old female with multiple chronic medical conditions and mild disc bulge at L4-5 who presents with worsening low back pain. The pain is constant and often shoots down legs. She states that it is worse with sitting and laying down on back or stomach, causing her to sleep in recliner for 2-3 hours per day. She is using bengay, icy hot and other topicals without relief. Other things she has tried are Voltaren gel (reports lasts 15 min), heat packs and tens which have helped somewhat but do not last. Patient also reports she has increased pain in the ball of her feet bilaterally and requests new orthotics. Meds: meloxicam BID, Flexeril BID, Percocet TID, Tramadol TID, Lyrica BID  Review of Systems  - + pain with cough/sneeze - bowel or bladder dysfunction - no fevers - no gait changes    Objective:   Physical Exam BP (!) 156/77   Ht 5\' 2"  (1.575 m)   Wt 210 lb (95.3 kg)   LMP 07/04/2016   BMI 38.41 kg/m  General: Well appearing woman, sitting comfortably in chair. A&O x3. NAD. Extremities: Warm and well-perfused. Cap refill < 3 sec. MSK: Back Exam: Inspection: Moderate reduction in lordosis of lumbar spine. No swelling or erythema. Motion: Full ROM in flexion without pain. Has pain at 10-15 degrees of extension.   Palpable tenderness over soft tissue in the general area of L4, L5, and sacrum as well as laterally over L paraspinal muscles FABER: positive bilaterally Leg strength: Quad: 5 / 5   Hamstring: 5 / 5   Hip flexor: 5 / 5     Foot inspection and palpation reveals breakdown of the transverse arch and a drop of MT heads bilaterally Abnormal callous is present along medial side of great toes bilaterally No hammer toes. TTP on plantar surface along MT heads 2-4 Strength at foot Plantar-flexion: 5 / 5 bilaterally  Dorsi-flexion: 5 / 5 on R, 4 / 5 on left    INJECTION: Patient was given informed consent, signed copy in the chart. Appropriate time out was taken. Area prepped and draped in usual sterile fashion. 1 cc of methylprednisolone 40 mg/ml plus  3 cc of 1% lidocaine without epinephrine was injected into the 2 trigger point areas paraspinal muscles 1 cm medial to the PSIS bilaterally using a(n) perpendicular approach. The patient tolerated the procedure well. There were no complications. Post procedure instructions were given.   Metatarsal pads (XS) placed on existing semi-rigid orthotic inserts bilaterally    Assessment:     #1. Musculoskeletal low back pain #2. Metatarsalgia    Plan:    trigger point injection muscles of the low back. Recommend hyperextension exercises #2. Extra small metatarsal pads added to her existing orthotics Low back pain: - Corticosteroid injection today into lower back - Advised patient on weight loss - Advised patient on benefits vs risks and recovery of lower back surgery - Advised patient on return precautions and red flags for back pain.  Metatarsalgia: - MT pads placed on existing inserts  [employer] [facility]

## 2016-12-18 ENCOUNTER — Other Ambulatory Visit: Payer: Self-pay | Admitting: *Deleted

## 2016-12-18 DIAGNOSIS — E119 Type 2 diabetes mellitus without complications: Secondary | ICD-10-CM

## 2016-12-18 DIAGNOSIS — Z794 Long term (current) use of insulin: Secondary | ICD-10-CM

## 2016-12-18 MED ORDER — ACCU-CHEK FASTCLIX LANCETS MISC: 12 refills | Status: DC

## 2016-12-18 MED ORDER — ACCU-CHEK GUIDE W/DEVICE KIT: 1.0000 | PACK | Freq: Three times a day (TID) | 0 refills | Status: DC

## 2016-12-18 MED ORDER — GLUCOSE BLOOD VI STRP: ORAL_STRIP | 12 refills | Status: DC

## 2016-12-21 ENCOUNTER — Telehealth: Payer: Self-pay | Admitting: *Deleted

## 2016-12-21 NOTE — Telephone Encounter (Signed)
Information was sent to [place] for Prior Authorization fr Januvia.  Was  approved starting 6/8/20198 thru 12/21/2008.  [facility] [address] was called and informed of.  Sander Nephew, RN 12/21/2016 11:21 AM.

## 2017-01-01 ENCOUNTER — Other Ambulatory Visit: Payer: Self-pay

## 2017-01-01 DIAGNOSIS — R21 Rash and other nonspecific skin eruption: Secondary | ICD-10-CM

## 2017-01-01 MED ORDER — CETIRIZINE HCL 10 MG PO TABS: 10.0000 mg | ORAL_TABLET | Freq: Every day | ORAL | 0 refills | Status: DC

## 2017-01-01 MED ORDER — HYDROXYZINE HCL 25 MG PO TABS: 25.0000 mg | ORAL_TABLET | Freq: Three times a day (TID) | ORAL | 1 refills | Status: DC | PRN

## 2017-01-01 NOTE — Telephone Encounter (Signed)
Pt calling back asking for  cetirizine (ZYRTEC) 10 MG tablet, and  hydrOXYzine (ATARAX/VISTARIL) 25 MG tablet to be refilled @ [facility] on [address].

## 2017-01-01 NOTE — Telephone Encounter (Signed)
Requesting meds refill for itching, do not remember the name.

## 2017-01-02 ENCOUNTER — Telehealth: Payer: Self-pay | Admitting: Internal Medicine

## 2017-01-02 NOTE — Telephone Encounter (Signed)
Patient states medication for her rash is not at the pharmacy.  Please call patient.

## 2017-01-02 NOTE — Telephone Encounter (Signed)
Called pharmacy back, pharmacist states both are ready, she again will call them and let them know all is ready

## 2017-01-17 ENCOUNTER — Telehealth: Payer: Self-pay | Admitting: *Deleted

## 2017-01-17 NOTE — Telephone Encounter (Signed)
Received faxed PA request from pt's pharmacy ([facility] [address])  for her Lyrica.  Per pt's EMR, she has tried and failed gabapentin and cymbalta in the past.  Request submitted online via [place] [facility].  Request approved.[name]7/5/201811:28 AM  Pharmacy aware and will contact pt when rx is ready for pickup  [phone] W  PHARMACY [phone] K  [name] 01/17/2017  APPROVED 01/17/2017 - 01/17/2018 DMA

## 2017-01-23 ENCOUNTER — Ambulatory Visit (INDEPENDENT_AMBULATORY_CARE_PROVIDER_SITE_OTHER): Payer: Medicaid Other | Admitting: Internal Medicine

## 2017-01-23 ENCOUNTER — Encounter: Payer: Self-pay | Admitting: Internal Medicine

## 2017-01-23 VITALS — BP 136/80 | HR 94 | Temp 98.2°F | Wt 227.4 lb

## 2017-01-23 DIAGNOSIS — E119 Type 2 diabetes mellitus without complications: Secondary | ICD-10-CM

## 2017-01-23 DIAGNOSIS — R234 Changes in skin texture: Secondary | ICD-10-CM | POA: Diagnosis not present

## 2017-01-23 DIAGNOSIS — M797 Fibromyalgia: Secondary | ICD-10-CM

## 2017-01-23 DIAGNOSIS — I10 Essential (primary) hypertension: Secondary | ICD-10-CM | POA: Diagnosis not present

## 2017-01-23 DIAGNOSIS — R232 Flushing: Secondary | ICD-10-CM

## 2017-01-23 DIAGNOSIS — Z79899 Other long term (current) drug therapy: Secondary | ICD-10-CM | POA: Diagnosis not present

## 2017-01-23 DIAGNOSIS — Z794 Long term (current) use of insulin: Secondary | ICD-10-CM

## 2017-01-23 DIAGNOSIS — Z79891 Long term (current) use of opiate analgesic: Secondary | ICD-10-CM

## 2017-01-23 DIAGNOSIS — G8929 Other chronic pain: Secondary | ICD-10-CM | POA: Diagnosis not present

## 2017-01-23 DIAGNOSIS — L853 Xerosis cutis: Secondary | ICD-10-CM

## 2017-01-23 DIAGNOSIS — G2581 Restless legs syndrome: Secondary | ICD-10-CM

## 2017-01-23 LAB — POCT GLYCOSYLATED HEMOGLOBIN (HGB A1C): Hemoglobin A1C: 10

## 2017-01-23 LAB — GLUCOSE, CAPILLARY: Glucose-Capillary: 311 mg/dL — ABNORMAL HIGH (ref 65–99)

## 2017-01-23 MED ORDER — CETAPHIL MOISTURIZING EX LOTN: 1.0000 "application " | TOPICAL_LOTION | CUTANEOUS | 0 refills | Status: DC | PRN

## 2017-01-23 MED ORDER — DICLOFENAC SODIUM 1 % TD GEL: 2.0000 g | Freq: Four times a day (QID) | TRANSDERMAL | 3 refills | Status: DC

## 2017-01-23 MED ORDER — VENLAFAXINE HCL ER 37.5 MG PO TB24: 75.0000 mg | ORAL_TABLET | Freq: Every day | ORAL | 0 refills | Status: DC

## 2017-01-23 NOTE — Progress Notes (Addendum)
CC: T2DM  HPI:  [name] is a 56 y.o.  female with PMH as listed below including T2DM, chronic pain, HTN, and fibromyalgia who presents for follow up management of her T2DM, HTN, and hot flashes.  T2DM: Patient is currently prescribed Lantus 35 units qhs and Januvia 100 mg daily. Her last Hgb A1c was 7.3 on 10/24/16. She says she was out of her Januvia for 2 months before finally able to fill it about 1 month ago. She was advised to let us know if she is having issues obtaining her medications, however has not done so. She reports adherence to Lantus 35 units nightly. She has not tolerated Metformin in the past due to GI side effects. She is not checking her blood sugars consistently. She has limited exercise ability due to her chronic pain. She reports trying to improve her diet, however has difficulty maintaining healthy eating patterns. She denies any hypoglycemic or hyperglycemic episodes.  HTN: Patient is currently off of anti-hypertensives due to improved BPs and self-reported hypotensive episodes when she was on Lisinopril. BP this visit is 136/80.  Hot Flashes: Patient reports continued frequent and intense hot flashes with alternating hot and cold spells, worse at night. She was started on Venflaxine ER 37.5 mg daily on last visit without much improvement.  Chronic Pain/Fibromyalgia/RLS: Patient has long-standing difficult to control chronic pain secondary to fibromyalgia, MSK joint pain, and L4-5 disc protrusion. She has seen Sports Medicine, Orthopedics, Plastic Surgery, and Podiatry for her MSK pain, adhesive capsulitis, small rotator cuff tear of her left shoulder, Dupuytren's contractures, and plantar fasciitis.   She is s/p left shoulder arthroscopy with rotator cuff repair on 09/18/2016. She was receiving pain medication from [name] with a pain contract. She was taking Oxycodone/APAP Norco 5-325 mg 2 tablets TID as needed #180/month and Tramadol 50 mg 2 tablets q8h as  needed #180/month for her fibromyalgia pain. Since our last visit, she was referred to and has been seen by Pain Management. She is now taking Oxycodone 10 mg QID prn (off Norco) and continuing Tramadol as above. She has one printed prescription of her previous Norco which she is not filling and will return to our clinic. She is also taking Meloxicam and using Voltaren gel for pain. She was prescribed Lyrica 75 mg BID as well to help with her fibromyalgia, neuropathy, hot flashes, and restless legs. She states that she only started taking this 5 days ago.  Dry Skin: Patient has been using Cetaphil lotion with relief and is requesting a new prescription.   Past Medical History:  Diagnosis Date  . 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 Arthritis   . Diabetes mellitus    oral tx  . GERD (gastroesophageal reflux disease)   . History of post-sterilization tuboplasty 2000  . Plantar fasciitis    Right  . Shortness of breath    with exertion  . Sleep apnea 5 plus yrs   study -pt could not sleep test inconclusive.   . Tear of meniscus of left knee    x2  . Tear of meniscus of right knee    Review of Systems:   Review of Systems  Constitutional: Negative for weight loss.       Hot flashes  Respiratory: Negative for shortness of breath.   Cardiovascular: Negative for chest pain.  Gastrointestinal: Negative for constipation.  Musculoskeletal: Positive for joint pain and myalgias. Negative for falls.  Skin: Positive  for itching.       Occasional rash  Neurological: Positive for tingling. Negative for sensory change and loss of consciousness.     Physical Exam:  Vitals:   01/23/17 1334  BP: 136/80  Pulse: 94  Temp: 98.2 F (36.8 C)  TempSrc: Oral  SpO2: 98%  Weight: 227 lb 6.4 oz (103.1 kg)   Physical Exam  Constitutional: She is oriented to person, place, and time. She appears well-nourished. No distress.  Cardiovascular: Normal  rate.   No murmur heard. Pulmonary/Chest: Effort normal. No respiratory distress. She has no wheezes. She has no rales.  Neurological: She is alert and oriented to person, place, and time.  Skin: Skin is warm. She is not diaphoretic.  Right palm with post-surgical wound healing    Assessment & Plan:   See Encounters Tab for problem based charting.  Patient discussed with Dr. Dareen Piano  Type 2 diabetes mellitus without complication, with long-term current use of insulin ([facility]) Repeat Hgb A1c this month is 10.0. Her diabetic control is predictably worsened after she was off of her Januvia for 2 months. She states that she has been taking this for almost one month now and should have a 90 day supply at the moment with 3 refills ordered. I again advised her to contact us if she has any issues obtaining her medications. We will continue her current medications for now and consider adding on Liraglutide in the future if her A1c is not improving. - Continue Lantus 35 units qhs - Continue Januvia 100 mg daily - Consider Liraglutide in the future - Counseled on healthy eating patterns - f/u in 3 months for repeat A1c  Essential hypertension BP Readings from Last 3 Encounters:  01/23/17 136/80  12/07/16 (!) 156/77  11/21/16 126/71    BP is stable off of antihypertensives. We will continue to monitor.  Hot flashes Patient without significant improvement in her hot flashes. We will increase her Venlafaxine ER to 75 mg daily. Lyrica may also provide some relief here.  Long-term current use of opiate analgesic Patient is now following with Pain Management for her chronic pain secondary to fibromyalgia, MSK joint pain, and L4-5 disc protrusion. She is now taking Oxycodone 10 mg QID prn (off Norco) and continuing her prior Tramadol with plans to wean off. She also takes Meloxicam, Flexeril, and Lyrica for pain/fibro/neuropathy/restless legs. - Continue to follow with Pain Management - Continue  Lyrica - consider changing to Pramipexole or Ropinirole if RLS is not controlled  - Patient will return her prior Norco Rx w/o filling  Dry skin Patient's dry skin improves with Cetaphil lotion. Will advise to continue Cetaphil and avoid provoking factors.

## 2017-01-23 NOTE — Patient Instructions (Signed)
It was a pleasure to see you again [name].  Your Hemoglobin A1c is 10.0 today up from 7.3 last time. This is most likely due to being out of the [place] for 3 months.  We will increase your Venlafaxine to 75 mg once daily for your hot flashes.  Please continue your other medications as prescribed.  Please follow up with me in 3 months or sooner if needed.

## 2017-01-24 ENCOUNTER — Telehealth: Payer: Self-pay | Admitting: *Deleted

## 2017-01-24 DIAGNOSIS — R232 Flushing: Secondary | ICD-10-CM

## 2017-01-24 MED ORDER — VENLAFAXINE HCL ER 75 MG PO TB24: 75.0000 mg | ORAL_TABLET | Freq: Every day | ORAL | 3 refills | Status: DC

## 2017-01-24 NOTE — Assessment & Plan Note (Signed)
Patient without significant improvement in her hot flashes. We will increase her Venlafaxine ER to 75 mg daily. Lyrica may also provide some relief here.

## 2017-01-24 NOTE — Progress Notes (Signed)
Internal Medicine Clinic Attending  Case discussed with Dr. Patel soon after the resident saw the patient.  We reviewed the resident's history and exam and pertinent patient test results.  I agree with the assessment, diagnosis, and plan of care documented in the resident's note. 

## 2017-01-24 NOTE — Assessment & Plan Note (Signed)
Repeat Hgb A1c this month is 10.0. Her diabetic control is predictably worsened after she was off of her Januvia for 2 months. She states that she has been taking this for almost one month now and should have a 90 day supply at the moment with 3 refills ordered. I again advised her to contact us if she has any issues obtaining her medications. We will continue her current medications for now and consider adding on Liraglutide in the future if her A1c is not improving. - Continue Lantus 35 units qhs - Continue Januvia 100 mg daily - Consider Liraglutide in the future - Counseled on healthy eating patterns - f/u in 3 months for repeat A1c

## 2017-01-24 NOTE — Telephone Encounter (Signed)
Rx changed to Venlafaxine ER 75 mg tab once daily. Thank you.

## 2017-01-24 NOTE — Telephone Encounter (Signed)
Received PA request from pt's pharmacy for the following medication venlafaxine ER 37.5mg  tab- Take 2 tabs daily #60.  Per medicaid, rx exceeds plan limit.  The allowed qty for the extended release tabs is one tab daily.  The allowed qty for the immediate release tabs is  two tabs daily.   Will send info to ordering MD to have rx changed to venlafaxine ER 75mg  tab once daily (if appropriate).  Please advise.[name]7/12/20182:03 PM      Forms and strengths Generic: Venlafaxine  Form: immediate-release oral tablet Strengths: 25 mg, 37.5 mg, 50 mg, 75 mg, 100 mg  Form: extended-release oral tablet Strengths: 37.5 mg, 75 mg, 150 mg, 225 mg

## 2017-01-24 NOTE — Assessment & Plan Note (Addendum)
Patient is now following with Pain Management for her chronic pain secondary to fibromyalgia, MSK joint pain, and L4-5 disc protrusion. She is now taking Oxycodone 10 mg QID prn (off Norco) and continuing her prior Tramadol with plans to wean off. She also takes Meloxicam, Flexeril, and Lyrica for pain/fibro/neuropathy/restless legs. - Continue to follow with Pain Management - Continue Lyrica - consider changing to Pramipexole or Ropinirole if RLS is not controlled  - Patient will return her prior Norco Rx w/o filling

## 2017-01-24 NOTE — Assessment & Plan Note (Signed)
BP Readings from Last 3 Encounters:  01/23/17 136/80  12/07/16 (!) 156/77  11/21/16 126/71    BP is stable off of antihypertensives. We will continue to monitor.

## 2017-01-26 DIAGNOSIS — L853 Xerosis cutis: Secondary | ICD-10-CM | POA: Insufficient documentation

## 2017-01-26 NOTE — Assessment & Plan Note (Signed)
Patient's dry skin improves with Cetaphil lotion. Will advise to continue Cetaphil and avoid provoking factors.

## 2017-01-28 NOTE — Telephone Encounter (Signed)
Pt still unable to get medication.  Call made back to pharmacy-they are now requesting rx be changed to capsules (the preferred).  Verbal authorization given for venlafaxine ER 75mg  capsules once daily #90 with 3 refills.  Regenia Skeeter, Darlene Cassady7/16/20184:57 PM

## 2017-01-28 NOTE — Telephone Encounter (Signed)
Please call pt back about PA on med.

## 2017-01-29 NOTE — Progress Notes (Signed)
Internal Medicine Clinic Attending  Case discussed with Dr. Patel at the time of the visit.  We reviewed the resident's history and exam and pertinent patient test results.  I agree with the assessment, diagnosis, and plan of care documented in the resident's note.  

## 2017-01-31 DIAGNOSIS — G8929 Other chronic pain: Secondary | ICD-10-CM | POA: Insufficient documentation

## 2017-02-07 ENCOUNTER — Other Ambulatory Visit: Payer: Self-pay

## 2017-02-07 NOTE — Telephone Encounter (Signed)
Yes, Does she want me to send a new Rx?

## 2017-02-07 NOTE — Telephone Encounter (Signed)
Yes, please send new script to [facility] [address]

## 2017-02-07 NOTE — Telephone Encounter (Signed)
Will put in first thing in the morning.  Thank you!

## 2017-02-07 NOTE — Telephone Encounter (Signed)
Pt would like to change her insulin to toujeo from lantus, could you do this w/o an appt since she was seen 7/11

## 2017-02-07 NOTE — Telephone Encounter (Signed)
Needs to speak with a nurse about meds. Please call pt back.  

## 2017-02-08 MED ORDER — INSULIN GLARGINE 300 UNIT/ML ~~LOC~~ SOPN: 35.0000 [IU] | PEN_INJECTOR | Freq: Every day | SUBCUTANEOUS | 3 refills | Status: DC

## 2017-02-08 NOTE — Telephone Encounter (Signed)
Changed and Rx sent to [facility] [address].

## 2017-02-13 ENCOUNTER — Telehealth: Payer: Self-pay | Admitting: *Deleted

## 2017-02-13 ENCOUNTER — Other Ambulatory Visit: Payer: Self-pay | Admitting: *Deleted

## 2017-02-13 MED ORDER — INSULIN PEN NEEDLE 31G X 8 MM MISC: 6 refills | Status: DC

## 2017-02-13 NOTE — Telephone Encounter (Signed)
PA information was sent to [place] for [employer].  Approved 02/13/2017 thru 02/13/2018.  Sander Nephew, RN 02/13/2017 9:19 AM.

## 2017-02-15 IMAGING — DX DG CHEST 2V
2 series · 2 of 2 positions shown · non-contrast
Comparison: 07/08/2013

CLINICAL DATA: Cough and congestion for 4 days

EXAM:
CHEST  2 VIEW

[chest pa]
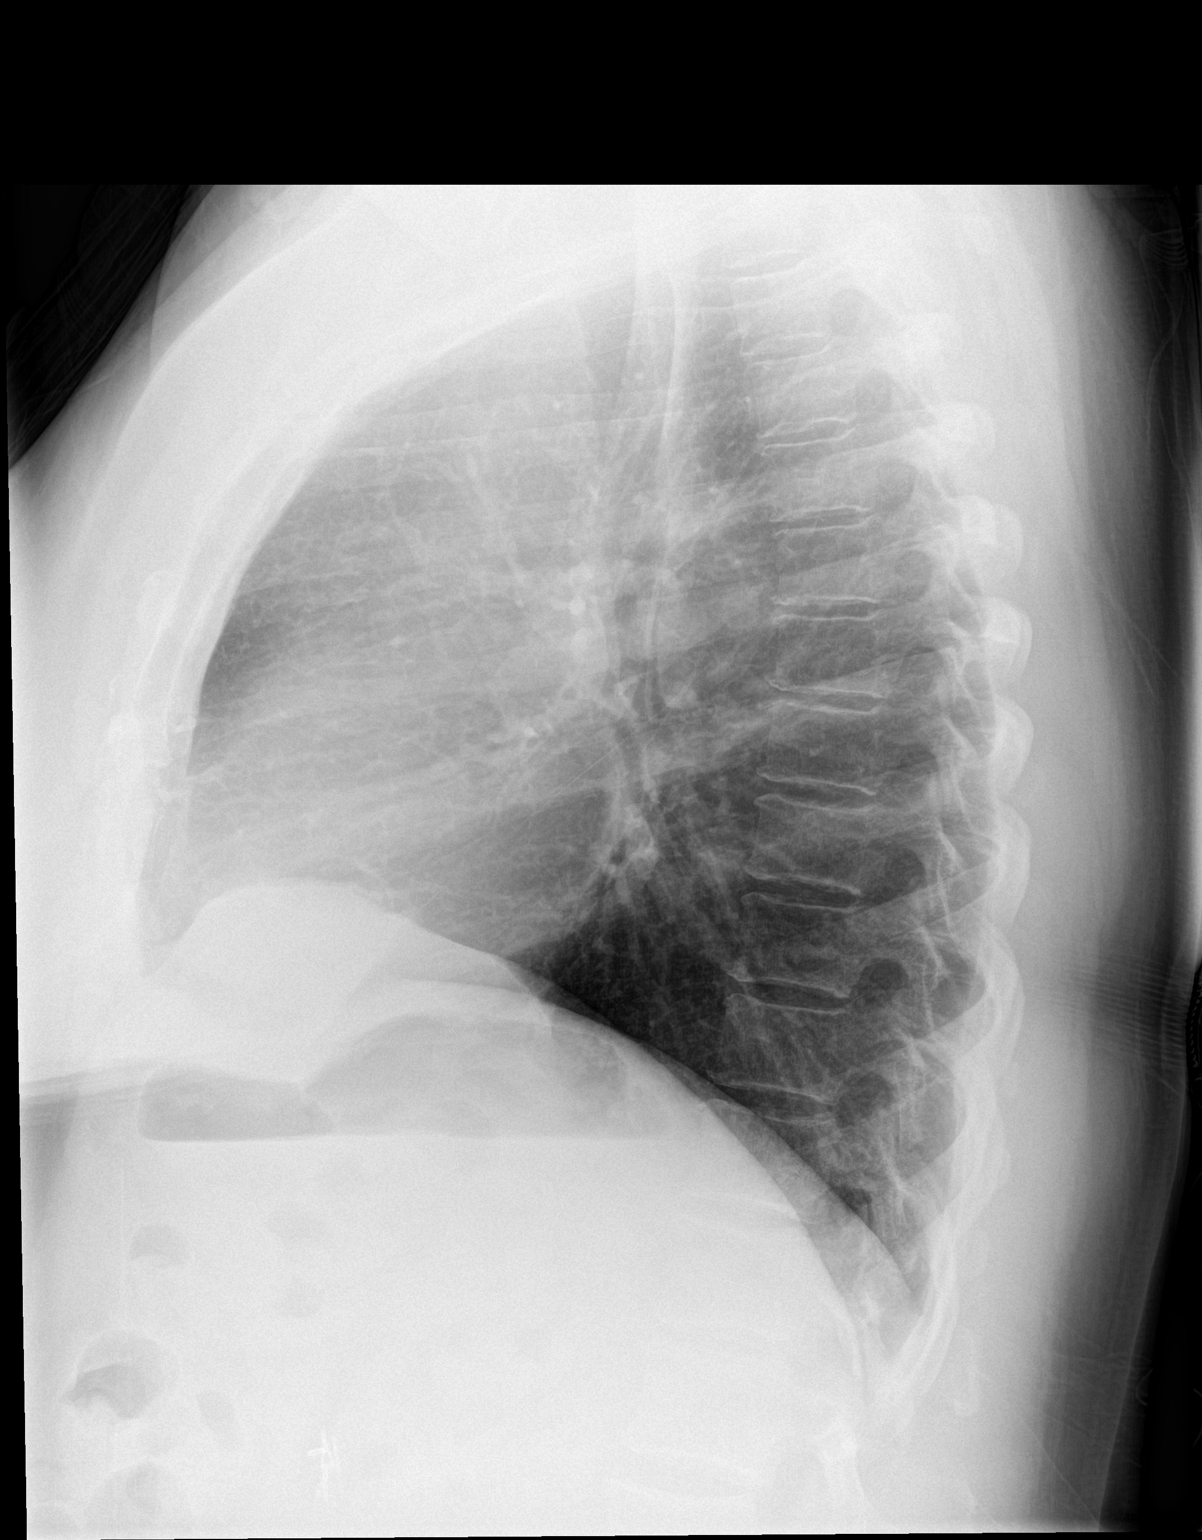

[chest lat]
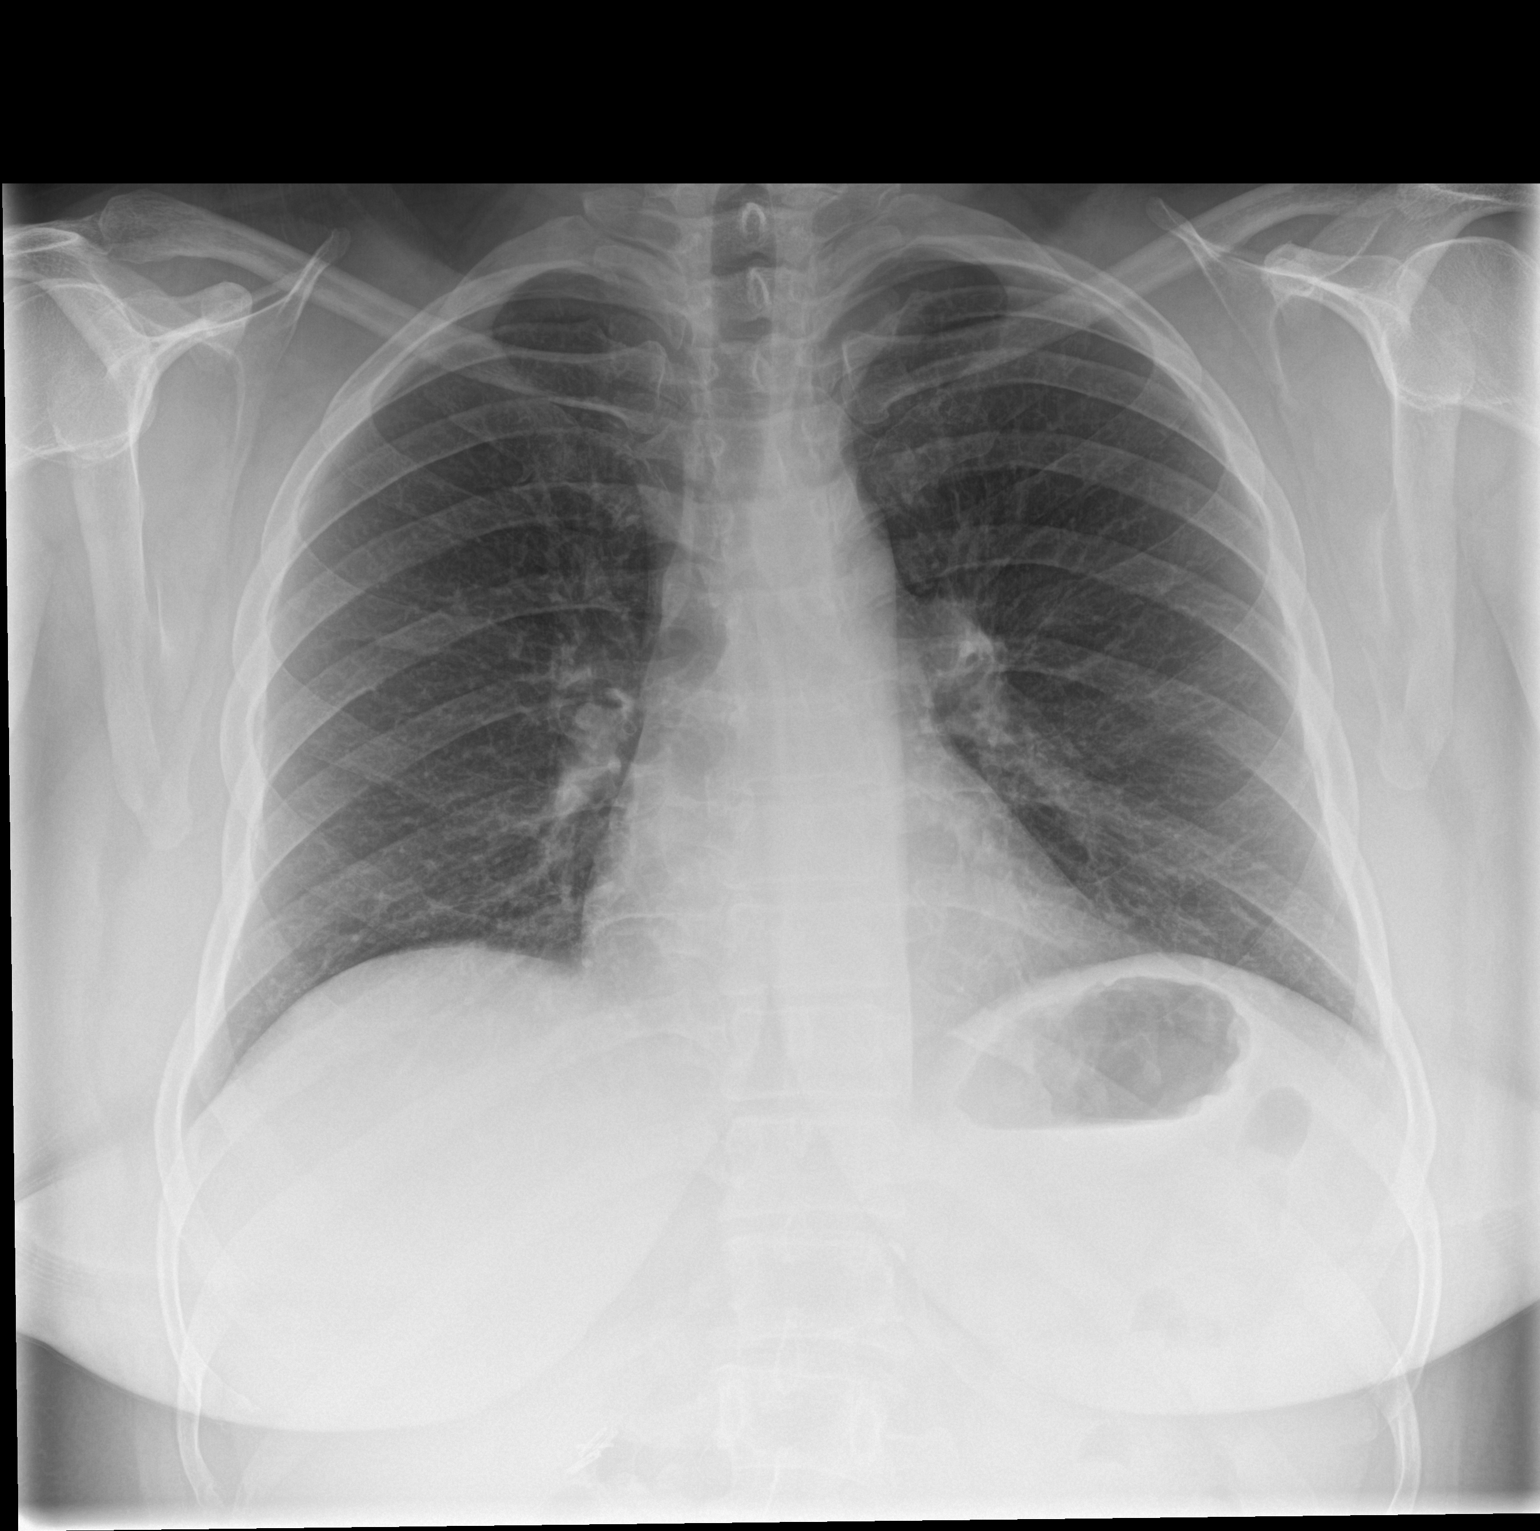

[2 of 2 positions shown; findings below may reference images not displayed]

FINDINGS: The heart size and mediastinal contours are within normal limits.
Both lungs are clear. The visualized skeletal structures are
unremarkable.
IMPRESSION: No active cardiopulmonary disease.

## 2017-02-18 DIAGNOSIS — M47816 Spondylosis without myelopathy or radiculopathy, lumbar region: Secondary | ICD-10-CM | POA: Insufficient documentation

## 2017-02-26 ENCOUNTER — Ambulatory Visit: Payer: Medicaid Other | Admitting: Dietician

## 2017-02-26 ENCOUNTER — Ambulatory Visit (INDEPENDENT_AMBULATORY_CARE_PROVIDER_SITE_OTHER): Payer: Medicaid Other | Admitting: Internal Medicine

## 2017-02-26 ENCOUNTER — Telehealth: Payer: Self-pay | Admitting: Dietician

## 2017-02-26 VITALS — BP 151/81 | HR 73 | Temp 98.8°F | Wt 232.5 lb

## 2017-02-26 DIAGNOSIS — E119 Type 2 diabetes mellitus without complications: Secondary | ICD-10-CM

## 2017-02-26 DIAGNOSIS — I10 Essential (primary) hypertension: Secondary | ICD-10-CM

## 2017-02-26 DIAGNOSIS — L237 Allergic contact dermatitis due to plants, except food: Secondary | ICD-10-CM | POA: Diagnosis not present

## 2017-02-26 DIAGNOSIS — Z794 Long term (current) use of insulin: Secondary | ICD-10-CM

## 2017-02-26 MED ORDER — DIPHENHYDRAMINE HCL 25 MG PO CAPS: 25.0000 mg | ORAL_CAPSULE | Freq: Four times a day (QID) | ORAL | 0 refills | Status: DC | PRN

## 2017-02-26 MED ORDER — PREDNISONE 10 MG PO TABS: 40.0000 mg | ORAL_TABLET | Freq: Every day | ORAL | 0 refills | Status: DC

## 2017-02-26 NOTE — Assessment & Plan Note (Signed)
She was provided with instructions to monitor her blood sugar, during her course of prednisone. She is aware of the necessity changes to be made in her insulin use if needed.

## 2017-02-26 NOTE — Progress Notes (Signed)
   CC: Rash after exposure to poison IVY.  HPI:  [name] is a 56 y.o. past medical history as listed below came to the clinic after developing a rash on her face and lower back with an exposure to poison ivy yesterday evening.  According to patient she complained her puppy who was wrapped with vines, she put her hand to her face to wipe off the sweat and itch her lower back with the same hand, later found poison Ivy in those vines, approximately one hour later she started developing rash in her face and lower back, got worse to the point that she was unable to open her left eye, rash was accompanied with itching. No blister formation. She had some leftover prednisone at home from previous allergic reaction, she took approximately 80 mg of prednisone along with Benadryl last night, which helped resolution of her symptoms. This morning she noticed her swelling started worsening again which prompted her to seek medical attention. She denies any difficulty with breathing or dysphagia. She denies any change in her vision.  Past Medical History:  Diagnosis Date  . 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 Arthritis   . Diabetes mellitus    oral tx  . GERD (gastroesophageal reflux disease)   . History of post-sterilization tuboplasty 2000  . Plantar fasciitis    Right  . Shortness of breath    with exertion  . Sleep apnea 5 plus yrs   study -pt could not sleep test inconclusive.   . Tear of meniscus of left knee    x2  . Tear of meniscus of right knee    Review of Systems:  As per HPI.  Physical Exam:  Vitals:   02/26/17 0928  BP: (!) 151/81  Pulse: 73  Temp: 98.8 F (37.1 C)  TempSrc: Oral  SpO2: 99%  Weight: 232 lb 8 oz (105.5 kg)   Vitals:   02/26/17 0928  BP: (!) 151/81  Pulse: 73  Temp: 98.8 F (37.1 C)  TempSrc: Oral  SpO2: 99%  Weight: 232 lb 8 oz (105.5 kg)   General: Vital signs reviewed.  Patient is  well-developed and well-nourished, in no acute distress and cooperative with exam.  Skin. Mild erythema and edema of face more on the left lateral side of nose and under both eyes, more on the left cheek as compared to right. No blistering. No pharyngeal erythema or edema. A small erythematous area on her central lower back. Cardiovascular: RRR, S1 normal, S2 normal, no murmurs, gallops, or rubs. Pulmonary/Chest: Clear to auscultation bilaterally, no wheezes, rales, or rhonchi. Abdominal: Soft, non-tender, non-distended, BS +, no masses, organomegaly, or guarding present.  Extremities: No lower extremity edema bilaterally,  pulses symmetric and intact bilaterally. No cyanosis or clubbing. Psychiatric: Normal mood and affect. speech and behavior is normal. Cognition and memory are normal.  Assessment & Plan:   See Encounters Tab for problem based charting.  Patient seen with Dr. Angelia Mould.

## 2017-02-26 NOTE — Progress Notes (Signed)
Diabetes Self-Management Education  Visit Type: First/Initial  Appt. Start Time: 1015 Appt. End Time: 7106  02/26/2017  Ms. Marisa [name], identified by name and date of birth, is a 56 y.o. female with a diagnosis of Diabetes: Type 2.   ASSESSMENT Starting on prednisone for a rash. She reports her blood sugar went from 100s to 300s today after eating banana. Has been using her husband's test strips because she was told [facility] needs a PA for Medicaid to cover her accu chek strips. Wants to restart Victoza as she is concerned about her increasing weight despite walking and making healthier food choices.   Her PCP is a level 2- asked her to take the prednisone in the am, move her insulin to am and check blood sugar two-three times a day while on steroids  Plan to consult Dr. Posey Pronto for increased doses of Toujeo 10%-20% each day CBG x2 >200 per protocol. Call [facility] for strips. Ask about starting Victoza ASAP. She is due back on October for a repeat A1C.   Last menstrual period 07/04/2016.  weight-232# 17# increase from last year      Diabetes Self-Management Education - 02/26/17 1000      Visit Information   Visit Type First/Initial     Initial Visit   Diabetes Type Type 2   Are you currently following a meal plan? Yes   What type of meal plan do you follow? more vegatables and less starchy foods   Are you taking your medications as prescribed? Yes   Date Diagnosed 2014     Health Coping   How would you rate your overall health? Good     Psychosocial Assessment   Patient Belief/Attitude about Diabetes Motivated to manage diabetes   Self-care barriers Lack of material resources   Self-management support Doctor's office;Family;CDE visits   Patient Concerns Medication;Monitoring;Glycemic Control;Weight Control   Special Needs None   Preferred Learning Style Visual;Hands on   Learning Readiness Contemplating   How often do you need to have someone help you when you read  instructions, pamphlets, or other written materials from your doctor or pharmacy? 2 - Rarely     Pre-Education Assessment   Patient understands incorporating nutritional management into lifestyle. Needs Review   Patient understands using medications safely. Needs Review   Patient understands monitoring blood glucose, interpreting and using results Needs Review   Patient understands how to develop strategies to promote health/change behavior. Needs Review     Complications   Last HgB A1C per patient/outside source 10 %   How often do you check your blood sugar? 3-4 times/day   Fasting Blood glucose range (mg/dL) 70-129;130-179   Postprandial Blood glucose range (mg/dL) >200   Number of hypoglycemic episodes per month 0   Number of hyperglycemic episodes per week 5   Are you checking your feet? Yes   How many days per week are you checking your feet? 7      Individualized Plan for Diabetes Self-Management Training:   Learning Objective:  Patient will have a greater understanding of diabetes self-management. Patient education plan is to attend individual and/or group sessions per assessed needs and concerns.   Plan:   There are no Patient Instructions on file for this visit.  Expected Outcomes:     Education material provided: A1C conversion sheet  If problems or questions, patient to contact team via:  Phone  Future DSME appointment:   by phone daily while on prednisone. [name], Marisa [name], [place] 02/26/2017  10:46 AM.

## 2017-02-26 NOTE — Patient Instructions (Addendum)
Please check your blood sugar at least 2-3 times a day befor meals while on prednisone.  Take Prednisone in the am   Take toujeo in th e am as we discussed  I will call you one time a day  and adjust your diabetes medicine per Dr. Posey Pronto.   [name] [phone]

## 2017-02-26 NOTE — Assessment & Plan Note (Signed)
BP Readings from Last 3 Encounters:  02/26/17 (!) 151/81  01/23/17 136/80  12/07/16 (!) 156/77   Her blood pressure was elevated during office visit. She took 80 mg of prednisone last night. Currently she is not on any antihypertensives.  We will reassess during next follow-up visit.

## 2017-02-26 NOTE — Assessment & Plan Note (Signed)
Her rash was most likely because of allergic dermatitis due to poison ivy. It responded well with prednisone and Benadryl. Topical high-dose steroid will not be advisable because of its close proximity to eyes and on face.  -prednisone 40 mg daily for one week with taper for another week. -Benadryl 25 mg if needed for excessive itching.

## 2017-02-26 NOTE — Patient Instructions (Signed)
Thank you for visiting clinic today. I am giving you a prescription for prednisone, please take 40 mg daily for first week, then decrease it to 20 mg for another 3 days, then decrease it to 10 mg for 4 days before stopping. Please check your blood sugar at least twice daily and increase your insulin as directed if needed. You can also use Benadryl if needed for itching. Please follow-up in 2 weeks.

## 2017-02-26 NOTE — Telephone Encounter (Addendum)
Called patient to notify her that her test strips were covered and ready for pick up. She reports she tested again when she got home today and her blood sugar was in the 300s for a second time today. P Suggest increasing her Toujeo by 10-20% to 40 units/day. Dr. Zada Finders is level 2 on the protocol so will check with him first prior to increasing.

## 2017-02-26 NOTE — Telephone Encounter (Signed)
Called [facility] about test strips- strips went through when run for 50 at a time. Ready for pick up

## 2017-02-27 NOTE — Telephone Encounter (Signed)
Called [name] to folllow up on prevention of steroid induced hyperglycemia.  She reports blood sugar today 150 fasting, has not been since, not home right now. He rash is not getting better, 303 last night at bedtime and  220 130 am. She agreed to increase her toujeo to 40 units a day, start tomorrow am. Instructed if she stops the prednisone to go back to her usual dose of insulin . She verbalized understanding.    Will follow up with a call tomorrow afternoon.

## 2017-02-27 NOTE — Telephone Encounter (Signed)
Agree with recommendation, thank you Butch Penny.

## 2017-02-27 NOTE — Progress Notes (Signed)
Internal Medicine Clinic Attending  I saw and evaluated the patient.  I personally confirmed the key portions of the history and exam documented by Dr. Amin and I reviewed pertinent patient test results.  The assessment, diagnosis, and plan were formulated together and I agree with the documentation in the resident's note. 

## 2017-02-28 ENCOUNTER — Telehealth: Payer: Self-pay | Admitting: Dietician

## 2017-02-28 ENCOUNTER — Ambulatory Visit (INDEPENDENT_AMBULATORY_CARE_PROVIDER_SITE_OTHER): Payer: Medicaid Other | Admitting: Internal Medicine

## 2017-02-28 ENCOUNTER — Encounter: Payer: Self-pay | Admitting: Internal Medicine

## 2017-02-28 DIAGNOSIS — E119 Type 2 diabetes mellitus without complications: Secondary | ICD-10-CM

## 2017-02-28 DIAGNOSIS — L237 Allergic contact dermatitis due to plants, except food: Secondary | ICD-10-CM | POA: Diagnosis not present

## 2017-02-28 DIAGNOSIS — Z794 Long term (current) use of insulin: Secondary | ICD-10-CM | POA: Diagnosis not present

## 2017-02-28 DIAGNOSIS — I10 Essential (primary) hypertension: Secondary | ICD-10-CM | POA: Diagnosis not present

## 2017-02-28 MED ORDER — DIPHENHYDRAMINE HCL 25 MG PO CAPS: 25.0000 mg | ORAL_CAPSULE | Freq: Four times a day (QID) | ORAL | 0 refills | Status: DC | PRN

## 2017-02-28 MED ORDER — NYSTATIN 100000 UNIT/GM EX POWD: Freq: Four times a day (QID) | CUTANEOUS | 0 refills | Status: DC

## 2017-02-28 MED ORDER — PREDNISONE 50 MG PO TABS: ORAL_TABLET | ORAL | 0 refills | Status: DC

## 2017-02-28 NOTE — Telephone Encounter (Signed)
Called patient to follow up on blood sugars whie on steroids:  455 last night, took novolog 10 units ( husband's insulin)  200 this am, 381 now, going to take 10 units Novolog but wants Victoza, Novolog pens and her toujeo dose increased so she does not run out.  Wants to know if Dr. Posey Pronto will prescribe Victoza now, She says she liked how Victoza  Helped her lower her weight and her blood sugars.She was seen again today in the office and her prednisone was increased to 60 mg/day x 7 days.  P- Suggest increase Toujeo  20% to 48 units tomorrow am. Order Novolog flexpens 10 units with meals while on steroids vs start victoza vs both per Dr. Posey Pronto. Willl call patient tomorrow afternoon to obtain glucose readings

## 2017-02-28 NOTE — Progress Notes (Signed)
   CC: Worsening of her poison ivy dermatitis.  HPI:  [name] is a 56 y.o. lady with past medical history as listed below came to the clinic with complaint of worsening of her poison ivy dermatitis symptoms. She was seen in clinic on 02/26/2014 with an history of contact with poison ivy and having rash on her face and lower back. She was prescribed prednisone 40 mg daily for one week with a taper for next week. According to patient she continued to get symptoms, her itching is worse at evening and night, she has a spread of her poison ivy dermatitis to the back of her left knee and under her left breast,according to patient she was touching her face with the rash and then touched back of her knee and under her left breast because of some itching.according to patient prednisone to help in the morning, then her symptoms started getting worse and by 4-5  PM.according to patient she is unable to have a good night's sleep because of continuous itching. Patient denied any myalgia, worsening edema or fever.She denied any blister formation or discharge from her rash.  Past Medical History:  Diagnosis Date  . 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 Arthritis   . Diabetes mellitus    oral tx  . GERD (gastroesophageal reflux disease)   . History of post-sterilization tuboplasty 2000  . Plantar fasciitis    Right  . Shortness of breath    with exertion  . Sleep apnea 5 plus yrs   study -pt could not sleep test inconclusive.   . Tear of meniscus of left knee    x2  . Tear of meniscus of right knee    Review of Systems:  As per HPI.  Physical Exam:  Vitals:   02/28/17 1322  BP: (!) 155/81  Pulse: 70  Temp: 98.2 F (36.8 C)  TempSrc: Oral  SpO2: 97%  Weight: 235 lb 9.6 oz (106.9 kg)  Height: 5\' 2"  (1.575 m)    General: Vital signs reviewed.  Patient is well-developed and well-nourished, in no acute distress and cooperative with  exam.  Skin. Erythematous rash on her face involving nose, upper cheeks and for head, appears little improved as compared to 2 days ago, new erythematous rash behind left knee and under her left breast. No blistering or discharge. Cardiovascular: RRR, S1 normal, S2 normal, no murmurs, gallops, or rubs. Pulmonary/Chest: Clear to auscultation bilaterally, no wheezes, rales, or rhonchi. Abdominal: Soft, non-tender, non-distended, BS +, no masses, organomegaly, or guarding present.  Extremities: No lower extremity edema bilaterally,  pulses symmetric and intact bilaterally. No cyanosis or clubbing. Psychiatric: Normal mood and affect. speech and behavior is normal. Cognition and memory are normal.  Assessment & Plan:   See Encounters Tab for problem based charting.  Patient discussed with Dr. Dareen Piano.

## 2017-02-28 NOTE — Patient Instructions (Addendum)
Thank you for visiting clinic today. I am increasing the dose of prednisone to 60 mg daily for 1 week, we will decrease it to 40 mg for next week and then decrease it to 20 mg for the following week. You can also try Benadryl for itching. You can also use camomile lotion to help with itching. Poison ivy reactions normally take 1-2 weeks before you will see much improvement.

## 2017-03-01 NOTE — Assessment & Plan Note (Signed)
She will need to monitor her blood sugar more frequently while on prednisone. She is following up with Butch Penny and trying to keep her blood sugar under good control.

## 2017-03-01 NOTE — Assessment & Plan Note (Signed)
BP Readings from Last 3 Encounters:  02/28/17 (!) 155/81  02/26/17 (!) 151/81  01/23/17 136/80   She continued to have elevated blood pressure. She do have an history of hypertension, she was given lisinopril for a short period of time with a good result. It was discontinued as patient wants to control her blood pressure with dietary modifications. Her blood pressures remained within normal limits with dietary modifications. She is having current elevated blood pressure, most likely because of her prednisone. She will need reassessment once finished her prednisone course, if it remain elevated she will get benefit with then ACE inhibitor or ARB.

## 2017-03-01 NOTE — Telephone Encounter (Signed)
I agree [name].. Thank you

## 2017-03-01 NOTE — Progress Notes (Signed)
Internal Medicine Clinic Attending  Case discussed with Dr. Amin at the time of the visit.  We reviewed the resident's history and exam and pertinent patient test results.  I agree with the assessment, diagnosis, and plan of care documented in the resident's note.    

## 2017-03-01 NOTE — Assessment & Plan Note (Signed)
She was seen 2 days ago with poison ivy dermatitis. According to patient she continued to have worsened rash and itching. Her facial rash appears improving on exam today. She do have new rashes behind knee and under breast.  -Increased her prednisone to 60 mg for the first week, with a slow taper for next 2 weeks. -She was advised to use Benadryl for itching. -We also discussed with her that poison ivy dermatitis normally take couple of weeks to resolve.

## 2017-03-01 NOTE — Telephone Encounter (Signed)
Ms. Wynia' blood sugar last night at 130 am was 300 she took 10 units of Novolog. Her fasting blood sugar today was 190, she took 48 units toujeo this am but has not checked her blood sugar yet ( and cannot right now as she is not at home).   P: Advised her if CBG is >200  When she checks today that she can increase the Toujeo one more time to 55 units this weekend and to hold at that dose until she speaks with Dr. Maudie Mercury pharmacist on Monday.

## 2017-03-04 ENCOUNTER — Telehealth: Payer: Self-pay | Admitting: Pharmacist

## 2017-03-04 ENCOUNTER — Ambulatory Visit (INDEPENDENT_AMBULATORY_CARE_PROVIDER_SITE_OTHER): Payer: Medicaid Other | Admitting: Internal Medicine

## 2017-03-04 DIAGNOSIS — L237 Allergic contact dermatitis due to plants, except food: Secondary | ICD-10-CM | POA: Diagnosis not present

## 2017-03-04 NOTE — Assessment & Plan Note (Signed)
Her rash was improving on prednisone. She was informed during last 2 visits too that poison IV dermatitis symptoms can last for couple of weeks, that's why she was given prednisone for 3 weeks to prevent any recurrence.  On exam her rash seems improving, and I told her that it seems improving but symptoms can last for couple of weeks she get really frustrated and angry, stating that she do not want to see me again. She was offered a different physician, which she declined and walked out of the door.  I informed Dr. Heber Colville and we both went out to see her but she was gone.

## 2017-03-04 NOTE — Progress Notes (Signed)
   CC: Worsening of poison ivy rash.  HPI:  [name] is a 56 y.o.lady with past medical history as listed below came to the clinic with complaint of continuous worsening of her poison ivy dermatitis.   On exam her rash seems improving, and I told her that it seems improving but symptoms can last for couple of weeks she get really frustrated and angry, stating that she do not want to see me again. She was offered a different physician, which she declined and walked out of the door.  Past Medical History:  Diagnosis Date  . 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 Arthritis   . Diabetes mellitus    oral tx  . GERD (gastroesophageal reflux disease)   . History of post-sterilization tuboplasty 2000  . Plantar fasciitis    Right  . Shortness of breath    with exertion  . Sleep apnea 5 plus yrs   study -pt could not sleep test inconclusive.   . Tear of meniscus of left knee    x2  . Tear of meniscus of right knee    Review of Systems:  As per HPI.  Physical Exam:  Vitals:   03/04/17 1504  BP: (!) 148/70  Pulse: 80  Temp: 98.1 F (36.7 C)  TempSrc: Oral  SpO2: 97%  Weight: 236 lb 12.8 oz (107.4 kg)  Height: 5\' 2"  (1.575 m)   Vitals:   03/04/17 1504  BP: (!) 148/70  Pulse: 80  Temp: 98.1 F (36.7 C)  TempSrc: Oral  SpO2: 97%  Weight: 236 lb 12.8 oz (107.4 kg)  Height: 5\' 2"  (1.575 m)   General: Vital signs reviewed.  Patient is well-developed and well-nourished, in no acute distress.  Skin: facial rash was almost gone, improving erythema on her back and under her left breast.   Assessment & Plan:   See Encounters Tab for problem based charting.  Patient discussed with Dr. Angelia Mould

## 2017-03-05 ENCOUNTER — Ambulatory Visit (INDEPENDENT_AMBULATORY_CARE_PROVIDER_SITE_OTHER): Payer: Medicaid Other | Admitting: Internal Medicine

## 2017-03-05 VITALS — BP 138/63 | HR 74 | Temp 98.1°F | Ht 62.0 in | Wt 237.6 lb

## 2017-03-05 DIAGNOSIS — L299 Pruritus, unspecified: Secondary | ICD-10-CM

## 2017-03-05 DIAGNOSIS — E119 Type 2 diabetes mellitus without complications: Secondary | ICD-10-CM

## 2017-03-05 DIAGNOSIS — Z794 Long term (current) use of insulin: Secondary | ICD-10-CM | POA: Diagnosis not present

## 2017-03-05 MED ORDER — INSULIN GLARGINE 100 UNIT/ML SOLOSTAR PEN: 35.0000 [IU] | PEN_INJECTOR | Freq: Every day | SUBCUTANEOUS | 11 refills | Status: DC

## 2017-03-05 MED ORDER — NYSTATIN 100000 UNIT/GM EX POWD: Freq: Four times a day (QID) | CUTANEOUS | 3 refills | Status: DC

## 2017-03-05 NOTE — Assessment & Plan Note (Deleted)
Patient seen on 8/14 for poison ivy dermatitis. She was given a short course of steroids (40 mg x 1 week, 20mg  x3d, and 10mg  x4d). She will return to clinic 2 days later complaining about worsening rash and difficulty sleeping due to itching. Prednisone was increased to 60 mg x1 week, followed by 40mg  x1 week and 20mg  x1 week.

## 2017-03-05 NOTE — Patient Instructions (Addendum)
It was nice to meet you today, Ms. Ariola.   STOP taking prednisone.   I have sent a prescription for the nystatin powder to your pharmacy. I have also sent a new prescription for Lantus pen. Please continue to inject 35 units of Lantus at bedtime.  Continue using Zyrtec for itching. Also continue using Benadryl and hydroxyzine for itching at night.  I will call you if results from your blood tests are abnormal.

## 2017-03-05 NOTE — Progress Notes (Addendum)
Contacted patient for BG monitoring (DM patient on steroid course). Patient reports lowest BG as 85 this morning, 300s-400s yesterday which she treated with 5 units of Novolog with breakfast and lunch, 10 units at dinner. Patient states she has a clinic appointment today and they may change her prednisone---advised patient to defer to instructions given at today's [facility] appointment in [facility]. Patient verbalized understanding and was advised to contact clinic if any further concerns.  Reviewed [facility] note and found the prednisone has been stopped today, signing off of case

## 2017-03-05 NOTE — Progress Notes (Signed)
   CC: Rash follow-up  HPI:  [name] is a 56 y.o. female with PMH listed below who presents to clinic for rash follow-up. Please see problem based assessment and plan for further details.  Past Medical History:  Diagnosis Date  . 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 Arthritis   . Diabetes mellitus    oral tx  . GERD (gastroesophageal reflux disease)   . History of post-sterilization tuboplasty 2000  . Plantar fasciitis    Right  . Shortness of breath    with exertion  . Sleep apnea 5 plus yrs   study -pt could not sleep test inconclusive.   . Tear of meniscus of left knee    x2  . Tear of meniscus of right knee    Review of Systems:   Review of Systems  Constitutional: Negative for chills and fever.  Skin: Positive for itching and rash.  Neurological: Negative for sensory change and focal weakness.    Physical Exam:  Vitals:   03/05/17 1434  BP: 138/63  Pulse: 74  Temp: 98.1 F (36.7 C)  TempSrc: Oral  SpO2: 99%  Weight: 237 lb 9.6 oz (107.8 kg)  Height: 5\' 2"  (1.575 m)   General:Pleasant female, overweight, well-developed, in no acute distress HENT: NCAT,neck supple and FROM, OP clear without exudates or erythema, no oral lesions noted Cardiac: regular rate and rhythm, nl S1/S2, no murmurs, rubs or gallops  Pulm: CTAB, no wheezes or crackles, no increased work of breathing  Derm:  mild erythema under both breasts and flexor surfaces of both arms and legs. Few small papular lesions noted on antecubital fossa bilaterally, and 2 small papular lesions noted behind right knee. No blisters noted. Scratch marks over both lower extremities, neck, and back    Assessment & Plan:   See Encounters Tab for problem based charting.  Patient seen with Dr. Daryll Drown

## 2017-03-05 NOTE — Assessment & Plan Note (Addendum)
Patient seen on 8/14 for poison ivy dermatitis. She was given a short course of steroids (40 mg x 1 week, 20mg  x3d, and 10mg  x4d). She then returned to clinic 2 days later complaining about worsening rash and difficulty sleeping due to itching. Prednisone was increased to 60 mg x1 week, followed by 40mg  x1 week and 20mg  x1 week. She reports that at her second visit she complained about a new rash below her left breast. She was given nystatin powder for this with significant improvement in rash. She has now completed 1 week of prednisone 60 mg and reports  rash from poison ivy resolved but rash below the left breast worsened. She also has a new rash in her groin and flexor surfaces of her arms and legs. Rash is itchy, but not painful. She is currently on Zyrtec, hydroxyzine, and by mouth Benadryl with no relief of pruritus. Has also tried cold, wet compresses with no improvement. States her basal insulin was recently changed from Lantus to Toujeo 2-3 weeks ago, no other recent changes in medication.   On exam she has mild erythema under both breasts and flexor surfaces of both arms and legs. Few small papular lesions noted on antecubital fossa bilaterally, and 2 small papular lesions noted behind right knee. No blisters noted. Patient complains of pruritus on areas with the rash. However, on exam patient was constantly rubbing and scratching neck, arms, back, and lower extremities. Differential diagnosis includes allergic reaction to Toujeo, cutaneous candidiasis, and intertrigo. Unclear etiology at this time for diffuse pruritus.   - Nystatin powder refilled - Switch back to Lantus 35U QHS. Discontinue Toujeo.  - Stop the prednisone - CBC with differential, CMP, and TSH for further evaluation of diffuse pruritus. Will call patient if results are abnormal.

## 2017-03-06 LAB — CBC WITH DIFFERENTIAL/PLATELET
Basophils Absolute: 0 10*3/uL (ref 0.0–0.2)
Basos: 0 %
EOS (ABSOLUTE): 0.3 10*3/uL (ref 0.0–0.4)
Eos: 2 %
Hematocrit: 41.6 % (ref 34.0–46.6)
Hemoglobin: 13.8 g/dL (ref 11.1–15.9)
Immature Grans (Abs): 0.1 10*3/uL (ref 0.0–0.1)
Immature Granulocytes: 1 %
Lymphocytes Absolute: 6.4 10*3/uL — ABNORMAL HIGH (ref 0.7–3.1)
Lymphs: 47 %
MCH: 28.5 pg (ref 26.6–33.0)
MCHC: 33.2 g/dL (ref 31.5–35.7)
MCV: 86 fL (ref 79–97)
Monocytes Absolute: 0.8 10*3/uL (ref 0.1–0.9)
Monocytes: 6 %
Neutrophils Absolute: 5.8 10*3/uL (ref 1.4–7.0)
Neutrophils: 44 %
Platelets: 320 10*3/uL (ref 150–379)
RBC: 4.84 x10E6/uL (ref 3.77–5.28)
RDW: 14 % (ref 12.3–15.4)
WBC: 13.4 10*3/uL — ABNORMAL HIGH (ref 3.4–10.8)

## 2017-03-06 LAB — CMP14 + ANION GAP
ALT: 16 IU/L (ref 0–32)
AST: 19 IU/L (ref 0–40)
Albumin/Globulin Ratio: 1.4 (ref 1.2–2.2)
Albumin: 3.6 g/dL (ref 3.5–5.5)
Alkaline Phosphatase: 94 IU/L (ref 39–117)
Anion Gap: 14 mmol/L (ref 10.0–18.0)
BUN/Creatinine Ratio: 16 (ref 9–23)
BUN: 11 mg/dL (ref 6–24)
Bilirubin Total: 0.3 mg/dL (ref 0.0–1.2)
CO2: 25 mmol/L (ref 20–29)
Calcium: 8.7 mg/dL (ref 8.7–10.2)
Chloride: 99 mmol/L (ref 96–106)
Creatinine, Ser: 0.68 mg/dL (ref 0.57–1.00)
GFR calc Af Amer: 115 mL/min/{1.73_m2} (ref >59)
GFR calc non Af Amer: 99 mL/min/{1.73_m2} (ref >59)
Globulin, Total: 2.6 g/dL (ref 1.5–4.5)
Glucose: 177 mg/dL — ABNORMAL HIGH (ref 65–99)
Potassium: 3.6 mmol/L (ref 3.5–5.2)
Sodium: 138 mmol/L (ref 134–144)
Total Protein: 6.2 g/dL (ref 6.0–8.5)

## 2017-03-06 LAB — TSH: TSH: 1.75 u[IU]/mL (ref 0.450–4.500)

## 2017-03-06 NOTE — Progress Notes (Signed)
Internal Medicine Clinic Attending  I saw and evaluated the patient.  I personally confirmed the key portions of the history and exam documented by Dr. Santos-Sanchez and I reviewed pertinent patient test results.  The assessment, diagnosis, and plan were formulated together and I agree with the documentation in the resident's note. 

## 2017-03-06 NOTE — Addendum Note (Signed)
Addended by: Welford Roche on: 03/06/2017 03:55 PM   Modules accepted: Orders

## 2017-03-07 ENCOUNTER — Telehealth: Payer: Self-pay

## 2017-03-07 NOTE — Telephone Encounter (Signed)
Needs to speak with a nurse about meds. Please call pt back.  

## 2017-03-08 ENCOUNTER — Other Ambulatory Visit: Payer: Self-pay | Admitting: *Deleted

## 2017-03-08 DIAGNOSIS — R21 Rash and other nonspecific skin eruption: Secondary | ICD-10-CM

## 2017-03-08 NOTE — Progress Notes (Signed)
Internal Medicine Clinic Attending  Case discussed with Dr. Reesa Chew at the time of the visit.  We reviewed the resident's history and exam and pertinent patient test results.  I agree with the assessment, diagnosis, and plan of care documented in the resident's note. Dr Reesa Chew only had a limited visual exam as [name] was upset.  I know [name] and take care of her husband, I tried to go and find her in the office however she had already left.

## 2017-03-08 NOTE — Telephone Encounter (Signed)
Pt needs to speak with a nurse about insulin. Per patient she is out of insulin for two days. Please call back.

## 2017-03-09 ENCOUNTER — Encounter (HOSPITAL_COMMUNITY): Payer: Self-pay | Admitting: Internal Medicine

## 2017-03-09 ENCOUNTER — Emergency Department (HOSPITAL_COMMUNITY)
Admission: EM | Admit: 2017-03-09 | Discharge: 2017-03-09 | Disposition: A | Discharge status: Home / Self Care | Payer: Medicaid Other | Attending: Emergency Medicine | Admitting: Emergency Medicine

## 2017-03-09 DIAGNOSIS — L309 Dermatitis, unspecified: Secondary | ICD-10-CM

## 2017-03-09 DIAGNOSIS — R21 Rash and other nonspecific skin eruption: Secondary | ICD-10-CM | POA: Diagnosis not present

## 2017-03-09 DIAGNOSIS — Z79899 Other long term (current) drug therapy: Secondary | ICD-10-CM | POA: Insufficient documentation

## 2017-03-09 DIAGNOSIS — Z794 Long term (current) use of insulin: Secondary | ICD-10-CM | POA: Diagnosis not present

## 2017-03-09 DIAGNOSIS — E119 Type 2 diabetes mellitus without complications: Secondary | ICD-10-CM | POA: Diagnosis not present

## 2017-03-09 MED ORDER — HYDROXYZINE HCL 25 MG PO TABS: 25.0000 mg | ORAL_TABLET | Freq: Four times a day (QID) | ORAL | 0 refills | Status: DC

## 2017-03-09 MED ORDER — DEXAMETHASONE SODIUM PHOSPHATE 10 MG/ML IJ SOLN
10.0000 mg | Freq: Once | INTRAMUSCULAR | Status: AC
  Administered 2017-03-09: 10 mg via INTRAMUSCULAR
  Filled 2017-03-09: qty 1

## 2017-03-09 MED ORDER — TRIAMCINOLONE ACETONIDE 0.1 % EX CREA: 1.0000 "application " | TOPICAL_CREAM | Freq: Two times a day (BID) | CUTANEOUS | 0 refills | Status: DC

## 2017-03-09 NOTE — ED Triage Notes (Signed)
To ED via personal vehicle c/o rash that won't go away. Pt states the rash started on August 14th. Pt states rash started on the face. Pt went to clinic and was treated for poison ivy with steroids. Rash has continued to spread to ears, arms, groin, back of knees. Pt states taking benadryl and prescribed itching medication with no relief.

## 2017-03-09 NOTE — Discharge Instructions (Signed)
Please read attached information regarding your condition. Take Atarax as directed every 6 hours. Apply triamcinolone cream twice daily in affected areas. Continue nystatin powder usage under breast. Follow-up with PCP or dermatologist for further evaluation. Return to ED for worsening rash, trouble breathing, trouble swallowing, signs of severe allergic reaction, fevers, trouble moving neck.

## 2017-03-09 NOTE — ED Provider Notes (Signed)
[facility] DEPT Provider Note   CSN: [id number] Arrival date & time: 03/09/17  4235     History   Chief Complaint Chief Complaint  Patient presents with  . Rash    HPI [name] is a 56 y.o. female.  HPI  Patient, with past medical history of diabetes, presents to ED for evaluation of rash for the last 10 days. She states that the rash started on her face and is since spread to her creases in her elbows and back and knees. She was concerned that it was due to poison ivy which she has had in the past. She has taken oral steroids, Benadryl and Zyrtec with mild relief in symptoms. She states she has also developed a rash below her breast and in groin area. She states that the nystatin powder that her PCP has given to her has not helped.  She denies any lip swelling, trouble breathing, trouble swallowing, neck pain, fevers, new soaps, lotions, detergents. She states she gets similar itching 3-4 times a yearbut does not have a rash like this in the past.   Past Medical History:  Diagnosis Date  . 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 Arthritis   . Diabetes mellitus    oral tx  . GERD (gastroesophageal reflux disease)   . History of post-sterilization tuboplasty 2000  . Plantar fasciitis    Right  . Shortness of breath    with exertion  . Sleep apnea 5 plus yrs   study -pt could not sleep test inconclusive.   . Tear of meniscus of left knee    x2  . Tear of meniscus of right knee     Patient Active Problem List   Diagnosis Date Noted  . Poison ivy dermatitis 02/26/2017  . Dry skin 01/26/2017  . Rash 11/22/2016  . Restless legs syndrome (RLS) 10/24/2016  . Long-term current use of opiate analgesic 03/01/2016  . Hot flashes 12/29/2015  . S/P left rotator cuff repair 12/09/2015  . Family history of breast cancer 11/29/2015  . Acromioclavicular joint arthritis 09/19/2015  . Essential hypertension 12/17/2014  .  Pruritus 01/18/2014  . Sesamoiditis thumb IP joint 12/11/2013  . Chronic back pain 06/15/2013  . Tenosynovitis of hand or wrist 03/13/2013  . Dysmenorrhea 12/02/2012  . Insomnia 12/02/2012  . Fibromyalgia 05/23/2012  . Preventative health care 03/01/2011  . Type 2 diabetes mellitus without complication, with long-term current use of insulin ([place]) 07/26/2010  . DUPUYTREN'S CONTRACTURE, RIGHT 10/19/2009  . Plantar fascial fibromatosis 09/06/2006  . ADHESIVE CAPSULITIS, RIGHT 09/04/2006  . Other tear of cartilage or meniscus of knee, current 09/04/2006    Past Surgical History:  Procedure Laterality Date  . CHOLECYSTECTOMY    . FOOT TENDON SURGERY    . KNEE ARTHROSCOPY  10/10/2011   Procedure: ARTHROSCOPY KNEE;  Surgeon: Newt Minion, MD;  Location: [facility];  Service: Orthopedics;  Laterality: Right;  Right Knee Arthroscopy and Excision Medial Meniscus  . MENISCUS REPAIR     R and L (L x2)  . TONSILLECTOMY    . TUBAL LIGATION      OB History    No data available       Home Medications    Prior to Admission medications   Medication Sig Start Date End Date Taking? Authorizing Provider  ACCU-CHEK FASTCLIX LANCETS MISC Check three times a day 12/18/16   Zada Finders, MD  Blood Glucose Monitoring Suppl (ACCU-CHEK GUIDE) w/Device  KIT 1 each by Does not apply route 3 (three) times daily. 12/18/16   Zada Finders, MD  cetaphil (CETAPHIL) lotion Apply 1 application topically as needed for dry skin. 01/23/17   Zada Finders, MD  cetirizine (ZYRTEC) 10 MG tablet Take 1 tablet (10 mg total) by mouth daily. 01/01/17   Zada Finders, MD  cyclobenzaprine (FLEXERIL) 10 MG tablet Take 1 tablet (10 mg total) by mouth 2 (two) times daily. 12/05/16   Axel Filler, MD  diclofenac sodium (VOLTAREN) 1 % GEL Apply 2 g topically 4 (four) times daily. 01/23/17   Zada Finders, MD  diphenhydrAMINE (BENADRYL) 25 mg capsule Take 1 capsule (25 mg total) by mouth every 6 (six) hours as needed. 02/28/17    Lorella Nimrod, MD  ferrous sulfate 325 (65 FE) MG tablet Take 1 tablet (325 mg total) by mouth daily. 11/12/16 11/12/17  Zada Finders, MD  glucose blood (ACCU-CHEK GUIDE) test strip Check three times a day 12/18/16   Zada Finders, MD  hydrOXYzine (ATARAX/VISTARIL) 25 MG tablet Take 1 tablet (25 mg total) by mouth every 6 (six) hours. 03/09/17   Byan Poplaski, PA-C  Insulin Glargine (LANTUS) 100 UNIT/ML Solostar Pen Inject 35 Units into the skin at bedtime. 03/05/17   Santos-Sanchez, Merlene Morse, MD  Insulin Pen Needle (EASY TOUCH PEN NEEDLES) 31G X 8 MM MISC use for insulin administration 1 time daily. 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 Insulin dependent 02/13/17   Zada Finders, MD  Insulin Syringe-Needle U-100 31G X 15/64" 0.3 ML MISC Please use for insulin administration 1 time daily. 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 Insulin dependent 12/05/16   Axel Filler, MD  meloxicam (MOBIC) 7.5 MG tablet Take 1 tablet (7.5 mg total) by mouth 2 (two) times daily as needed for pain. 12/05/16   Axel Filler, MD  nystatin (NYSTATIN) powder Apply topically 4 (four) times daily. 03/05/17   Welford Roche, MD  Oxycodone HCl 10 MG TABS Take 10 mg by mouth 4 (four) times daily as needed. 01/15/17   [provider]  predniSONE (DELTASONE) 10 MG tablet Take 4 tablets (40 mg total) by mouth daily with breakfast. Take 40 mg daily for 1 week, decrease it to 20 mg for next 3 days, decrease it to 10 mg daily for next 3-4 days before stopping. 02/26/17   Lorella Nimrod, MD  predniSONE (DELTASONE) 50 MG tablet Take 60 mg daily for 7 days, then decrease to 40 for next week, then decrease to 20 the following week. 02/28/17   Lorella Nimrod, MD  pregabalin (LYRICA) 75 MG capsule Take 1 capsule (75 mg total) by mouth 2 (two) times daily. 10/24/16   Zada Finders, MD  senna (SENOKOT) 8.6 MG tablet Take 1 tablet (8.6 mg total) by mouth daily as needed for constipation. 09/27/16   Zada Finders, MD  sitaGLIPtin (JANUVIA) 100 MG tablet Take 1 tablet (100  mg total) by mouth daily. 11/21/16   Zada Finders, MD  traMADol (ULTRAM) 50 MG tablet Take 2 tablets (100 mg total) by mouth every 8 (eight) hours as needed. 10/24/16   Zada Finders, MD  triamcinolone cream (KENALOG) 0.1 % Apply 1 application topically 2 (two) times daily. 03/09/17   Delia Heady, PA-C  Venlafaxine HCl 75 MG TB24 Take 1 tablet (75 mg total) by mouth daily. 01/24/17   Zada Finders, MD    Family History Family History  Problem Relation Age of Onset  . Breast cancer Sister        both sisters have breast cancer  Social History Social History  Substance Use Topics  . Smoking status: Never Smoker  . Smokeless tobacco: Never Used  . Alcohol use No     Allergies   Ofloxacin; Mango butter; and Metformin and related   Review of Systems Review of Systems  Constitutional: Negative for chills and fever.  HENT: Negative for rhinorrhea, sinus pain, sinus pressure and sore throat.   Gastrointestinal: Negative for nausea and vomiting.  Musculoskeletal: Negative for neck pain and neck stiffness.  Skin: Positive for rash.  Neurological: Negative for headaches.     Physical Exam Updated Vital Signs BP (!) 148/76 (BP Location: Right Arm)   Pulse 67   Temp 98.4 F (36.9 C) (Oral)   Resp 16   Ht '5\' 2"'  (1.575 m)   Wt 107.5 kg (237 lb)   LMP 07/04/2016   SpO2 99%   BMI 43.35 kg/m   Physical Exam  Constitutional: She appears well-developed and well-nourished. No distress.  HENT:  Head: Normocephalic and atraumatic.  Tolerating secretions. No lip swelling or signs of angioedema. No signs of airway compromise.  Eyes: Conjunctivae and EOM are normal. No scleral icterus.  Neck: Normal range of motion. Neck supple.  Full active and passive range of motion of neck. No meningismus.  Pulmonary/Chest: Effort normal. No respiratory distress.  Neurological: She is alert.  Skin: Rash noted. She is not diaphoretic.  There are small erythematous areas of excoriation on  bilateral flexor creases and elbows and back and knee. There is a yeastlike rash below the left breast.  Psychiatric: She has a normal mood and affect.  Nursing note and vitals reviewed.    ED Treatments / Results  Labs (all labs ordered are listed, but only abnormal results are displayed) Labs Reviewed - No data to display  EKG  EKG Interpretation None       Radiology No results found.  Procedures Procedures (including critical care time)  Medications Ordered in ED Medications  dexamethasone (DECADRON) injection 10 mg (not administered)     Initial Impression / Assessment and Plan / ED Course  I have reviewed the triage vital signs and the nursing notes.  Pertinent labs & imaging results that were available during my care of the patient were reviewed by me and considered in my medical decision making (see chart for details).     Patient presents to ED for evaluation of rash for the past 10 days. She states that symptoms were initially due to poison ivy but despite the use of oral prednisone, Benadryl and Zyrtec she continues to have itching. She does have history of itching similar to this 3-4 times a year but denies any rash. She denies any lip swelling, angioedema or signs of airway compromise. She denies any fevers, neck pain. On physical exam there is yeast like rash below left breast. There are erythematous small bumps with excoriations on flexor creases of upper extremities and behind knee. Pt has a patent airway without stridor and is handling secretions without difficulty; no angioedema. No blisters, no pustules, no warmth, no draining sinus tracts, no superficial abscesses, no bullous impetigo, no vesicles, no desquamation, no target lesions with dusky purpura or a central bulla. Not tender to touch. No concern for superimposed infection. No concern for SJS, TEN, TSS, tick borne illness, syphilis or other life-threatening condition.  I suspect that symptoms could be  due to eczema. Patient states that I am steroids have helped in the past. We'll give Atarax to take for itching and  steroid cream to use topically. Encouraged her to follow up with dermatologist for further evaluation. Patient appears stable for discharge at this time. Strict return precautions given.  Final Clinical Impressions(s) / ED Diagnoses   Final diagnoses:  Rash and nonspecific skin eruption  Eczema, unspecified type    New Prescriptions New Prescriptions   HYDROXYZINE (ATARAX/VISTARIL) 25 MG TABLET    Take 1 tablet (25 mg total) by mouth every 6 (six) hours.   TRIAMCINOLONE CREAM (KENALOG) 0.1 %    Apply 1 application topically 2 (two) times daily.     Delia Heady, PA-C 03/09/17 1032    Duffy Bruce, MD 03/11/17 [phone]

## 2017-03-11 NOTE — Telephone Encounter (Signed)
Spoke w/ pt 8/24 and pharmacy. resolved

## 2017-03-11 NOTE — Telephone Encounter (Signed)
Patient was seen in ED on 03/09/17 and given a prescription for Triamcinolone and Atarax.

## 2017-03-13 ENCOUNTER — Ambulatory Visit (INDEPENDENT_AMBULATORY_CARE_PROVIDER_SITE_OTHER): Payer: Medicaid Other | Admitting: Internal Medicine

## 2017-03-13 ENCOUNTER — Other Ambulatory Visit: Payer: Self-pay

## 2017-03-13 VITALS — BP 138/75 | HR 62 | Temp 98.0°F | Ht 62.0 in | Wt 233.9 lb

## 2017-03-13 DIAGNOSIS — L299 Pruritus, unspecified: Secondary | ICD-10-CM | POA: Diagnosis not present

## 2017-03-13 MED ORDER — NYSTATIN 100000 UNIT/GM EX POWD: Freq: Three times a day (TID) | CUTANEOUS | 6 refills | Status: DC

## 2017-03-13 MED ORDER — FLUCONAZOLE 100 MG PO TABS: 50.0000 mg | ORAL_TABLET | Freq: Every day | ORAL | 0 refills | Status: AC

## 2017-03-13 NOTE — Assessment & Plan Note (Deleted)
Patient recently seen in the ED 8/25 for worsening rash and pruritus and was given hydroxyzine and triamcinolone cream due to concern for eczema.  She was seen in clinic 8/21 for same complaint and was given nystatin powder given that she reported improvement of rash with it. CBC with WBC 13.4 and ALC 6.4. I called patient 8/22 to discuss results and asked her to follow up in 1 week. At that time, she reported improvement of rash with nystatin powder.   Rash:

## 2017-03-13 NOTE — Assessment & Plan Note (Addendum)
She was seen in clinic 8/21 for same complaint and was given nystatin powder given that she reported improvement of rash with it and rash seem consistent with cutaneous candidiasis on exam. CBC with WBC 13.4 and ALC 6.4. I called patient 8/22 to discuss results and asked her to follow up in 1 week. At that time, she reported improvement of rash with nystatin powder.    Patient recently seen in the ED 8/25 for worsening rash and pruritus and was given hydroxyzine and triamcinolone cream due to concern for eczema, but did not use them as her pharmacy never received the prescriptions. She also received a Decadron injection and states her pruritus and rash completely resolved. States the rash reappeared 2 days ago and has been progressively worsening. Has been trying nystatin powder with minimal relief in symptoms. Continues to use Zyrtec, hydroxyzine, and Benadryl with no improvement in pruritus.  Unclear etiology at this time. On exam, she continues to have significant erythema below her L breast associated with multiple blisters that are new from prior exam. Continues to have erythema and small papular lesions on left antecubital fossa. There is also erythema across chest, lateral aspect of back, and buttocks. Scratch marks and multiple scabs noted. States her rash worsens with heat exposure. However, her rash is diffuse and not limited to sun exposed areas. Low suspicion for photosensitivity. Rash below breast remains consistent with a cane candidiasis. Will try systemic therapy with oral Diflucan and reassess in 1 week.  Elevated WBC likely secondary to demargination in the setting of steroid use. However, would not expect to see lymphocytosis with steroid use. Low suspicion for malignancy at this time she reports no B symptoms. Will repeat CBC and obtain a blood smear for further evaluation.  Rash: unclear etiology  - Diflucan 50 mg daily for 7 days - Refilled nystatin powder per patient's request -  Advised patient to follow-up in 1 week. Consider skin biopsy if no improvement in rash. - Referral to dermatology placed  ADDENDUM: CBC with WBC 11.3 and ALC 5.2. Blood smear normal. No changes in management at this time.

## 2017-03-13 NOTE — Telephone Encounter (Signed)
triamcinolone cream (KENALOG) 0.1 %, refill request, please call pt back.

## 2017-03-13 NOTE — Progress Notes (Signed)
   CC: Rash follow-up  HPI:  [name] is a 56 y.o. female with PMH listed below who presents to clinic for rash follow-up. Please see problem based assessment and plan for further details.  Past Medical History:  Diagnosis Date  . 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 Arthritis   . Diabetes mellitus    oral tx  . GERD (gastroesophageal reflux disease)   . History of post-sterilization tuboplasty 2000  . Plantar fasciitis    Right  . Shortness of breath    with exertion  . Sleep apnea 5 plus yrs   study -pt could not sleep test inconclusive.   . Tear of meniscus of left knee    x2  . Tear of meniscus of right knee    Review of Systems:   Review of Systems  Constitutional: Negative for chills, fever and weight loss.  Skin: Positive for itching and rash.  Neurological: Negative for dizziness, sensory change, focal weakness and headaches.    Physical Exam:  Vitals:   03/13/17 0914  BP: 138/75  Pulse: 62  Temp: 98 F (36.7 C)  TempSrc: Oral  SpO2: 100%  Weight: 233 lb 14.4 oz (106.1 kg)  Height: 5\' 2"  (1.575 m)   General: Well-nourished, well-developed, in no acute distress HENT: NCAT, MMM, neck supple and FROM, OP clear without exudates or erythema, no oral lesions noted  Cardiac: regular rate and rhythm, nl S1/S2, no murmurs, rubs or gallops  Pulm: CTAB, no wheezes or crackles, no increased work of breathing   Ext: warm and well perfused, no peripheral edema, 2+ DP pulses bilaterally  Derm: continues to have significant erythema below her L breast associated with multiple blisters that are new from prior exam on 8/21. Continues to have erythema and small papular lesions on left antecubital fossa. There is also erythema across chest, lateral aspect of back, and buttocks. Scratch marks and multiple scabs noted across back, buttocks, arms and legs    Assessment & Plan:   See Encounters Tab for problem based  charting.  Patient seen with Dr. Lynnae January

## 2017-03-13 NOTE — Patient Instructions (Signed)
It was nice to see you again, [name].   Please start taking Diflucan 25 mg. Take half a tablet every day for 7 days. I have sent a prescription she will pharmacy. I have also sent a prescription for nystatin powder which she can continue to use as prescribed.  I have made a referral for dermatology. They will contact you to schedule an appointment.  I will call you if results from your blood tests are not normal.  Please follow-up in one week.

## 2017-03-14 LAB — CBC WITH DIFFERENTIAL/PLATELET
Basophils Absolute: 0 10*3/uL (ref 0.0–0.2)
Basos: 0 %
EOS (ABSOLUTE): 0.4 10*3/uL (ref 0.0–0.4)
Eos: 3 %
Hematocrit: 47.4 % — ABNORMAL HIGH (ref 34.0–46.6)
Hemoglobin: 15.4 g/dL (ref 11.1–15.9)
Immature Grans (Abs): 0 10*3/uL (ref 0.0–0.1)
Immature Granulocytes: 0 %
Lymphocytes Absolute: 5.2 10*3/uL — ABNORMAL HIGH (ref 0.7–3.1)
Lymphs: 47 %
MCH: 28.2 pg (ref 26.6–33.0)
MCHC: 32.5 g/dL (ref 31.5–35.7)
MCV: 87 fL (ref 79–97)
Monocytes Absolute: 0.5 10*3/uL (ref 0.1–0.9)
Monocytes: 4 %
Neutrophils Absolute: 5.2 10*3/uL (ref 1.4–7.0)
Neutrophils: 46 %
Platelets: 341 10*3/uL (ref 150–379)
RBC: 5.46 x10E6/uL — ABNORMAL HIGH (ref 3.77–5.28)
RDW: 13.7 % (ref 12.3–15.4)
WBC: 11.3 10*3/uL — ABNORMAL HIGH (ref 3.4–10.8)

## 2017-03-14 MED ORDER — TRIAMCINOLONE ACETONIDE 0.1 % EX CREA: 1.0000 "application " | TOPICAL_CREAM | Freq: Two times a day (BID) | CUTANEOUS | 0 refills | Status: DC

## 2017-03-15 NOTE — Progress Notes (Signed)
Internal Medicine Clinic Attending  I saw and evaluated the patient.  I personally confirmed the key portions of the history and exam documented by Dr. Santos-Sanchez and I reviewed pertinent patient test results.  The assessment, diagnosis, and plan were formulated together and I agree with the documentation in the resident's note. 

## 2017-03-16 LAB — PATHOLOGIST SMEAR REVIEW
Basophils Absolute: 0 10*3/uL (ref 0.0–0.2)
Basos: 0 %
EOS (ABSOLUTE): 0.4 10*3/uL (ref 0.0–0.4)
Eos: 4 %
Hematocrit: 46.7 % — ABNORMAL HIGH (ref 34.0–46.6)
Hemoglobin: 15.1 g/dL (ref 11.1–15.9)
Immature Grans (Abs): 0 10*3/uL (ref 0.0–0.1)
Immature Granulocytes: 0 %
Lymphocytes Absolute: 5.2 10*3/uL — ABNORMAL HIGH (ref 0.7–3.1)
Lymphs: 45 %
MCH: 28.9 pg (ref 26.6–33.0)
MCHC: 32.3 g/dL (ref 31.5–35.7)
MCV: 90 fL (ref 79–97)
Monocytes Absolute: 0.5 10*3/uL (ref 0.1–0.9)
Monocytes: 4 %
Neutrophils Absolute: 5.4 10*3/uL (ref 1.4–7.0)
Neutrophils: 47 %
Path Rev PLTs: NORMAL
Path Rev RBC: NORMAL
Platelets: 305 10*3/uL (ref 150–379)
RBC: 5.22 x10E6/uL (ref 3.77–5.28)
RDW: 14.1 % (ref 12.3–15.4)
WBC: 11.6 10*3/uL — ABNORMAL HIGH (ref 3.4–10.8)

## 2017-03-25 ENCOUNTER — Telehealth: Payer: Self-pay | Admitting: Internal Medicine

## 2017-03-25 ENCOUNTER — Other Ambulatory Visit: Payer: Self-pay | Admitting: *Deleted

## 2017-03-25 NOTE — Telephone Encounter (Signed)
Gave a call, request refill

## 2017-03-25 NOTE — Telephone Encounter (Signed)
Spoke to pt, sent request to md

## 2017-03-25 NOTE — Telephone Encounter (Signed)
Pls requesting a callback, pt having trouble with the rash

## 2017-03-25 NOTE — Telephone Encounter (Signed)
Pt requesting a callback from [facility]

## 2017-03-27 ENCOUNTER — Other Ambulatory Visit: Payer: Self-pay | Admitting: Internal Medicine

## 2017-03-27 ENCOUNTER — Telehealth: Payer: Self-pay | Admitting: Internal Medicine

## 2017-03-27 DIAGNOSIS — E119 Type 2 diabetes mellitus without complications: Secondary | ICD-10-CM

## 2017-03-27 DIAGNOSIS — Z794 Long term (current) use of insulin: Secondary | ICD-10-CM

## 2017-03-27 MED ORDER — INSULIN GLARGINE 100 UNIT/ML SOLOSTAR PEN: 35.0000 [IU] | PEN_INJECTOR | Freq: Every day | SUBCUTANEOUS | 11 refills | Status: DC

## 2017-03-27 NOTE — Telephone Encounter (Signed)
Unfortunately I have not seen the rash and if it is spreading despite the steroids and is widespread she will need to be seen. If we don't have appointments I advise urgent care.

## 2017-03-27 NOTE — Telephone Encounter (Signed)
Spoke to patient informing of triamcinolone request being denied. Advised to come in for appt but unavailable tomorrow or Fri ([facility] full today). She explained she had  used up the cream because the rash were widespread.  Rash has started under R breast 3 days ago & now it has spread to left breast. She said she was given diflucan pill the last visit & it helped clear some of the area.  She wants to know if she can have something else. She said it's very itchy & spreading. Pls advise!

## 2017-03-27 NOTE — Telephone Encounter (Signed)
Pls call pt waiting on results

## 2017-04-01 ENCOUNTER — Other Ambulatory Visit: Payer: Self-pay

## 2017-04-01 DIAGNOSIS — G2581 Restless legs syndrome: Secondary | ICD-10-CM

## 2017-04-01 DIAGNOSIS — M797 Fibromyalgia: Secondary | ICD-10-CM

## 2017-04-01 NOTE — Telephone Encounter (Signed)
pregabalin (LYRICA) 75 MG capsule,  ferrous sulfate 325 (65 FE) MG tablet, Refill request.

## 2017-04-02 MED ORDER — FERROUS SULFATE 325 (65 FE) MG PO TABS: 325.0000 mg | ORAL_TABLET | Freq: Every day | ORAL | 3 refills | Status: DC

## 2017-04-02 MED ORDER — PREGABALIN 75 MG PO CAPS: 75.0000 mg | ORAL_CAPSULE | Freq: Two times a day (BID) | ORAL | 1 refills | Status: DC

## 2017-04-02 NOTE — Telephone Encounter (Signed)
Called to pharm 

## 2017-04-02 NOTE — Telephone Encounter (Signed)
Called patient and discussed results. All questions answered. She followed up with dermatology recently on Friday. Skin biopsy performed at office visit. Dermatology will follow-up with patient once biopsy results available.

## 2017-04-11 DIAGNOSIS — M25561 Pain in right knee: Secondary | ICD-10-CM | POA: Insufficient documentation

## 2017-05-01 ENCOUNTER — Encounter: Payer: Self-pay | Admitting: Internal Medicine

## 2017-05-01 ENCOUNTER — Ambulatory Visit (INDEPENDENT_AMBULATORY_CARE_PROVIDER_SITE_OTHER): Payer: Medicaid Other | Admitting: Internal Medicine

## 2017-05-01 VITALS — BP 150/84 | HR 73 | Temp 98.0°F | Ht 62.0 in | Wt 234.8 lb

## 2017-05-01 DIAGNOSIS — Z79899 Other long term (current) drug therapy: Secondary | ICD-10-CM | POA: Diagnosis not present

## 2017-05-01 DIAGNOSIS — M797 Fibromyalgia: Secondary | ICD-10-CM

## 2017-05-01 DIAGNOSIS — Z23 Encounter for immunization: Secondary | ICD-10-CM

## 2017-05-01 DIAGNOSIS — G8929 Other chronic pain: Secondary | ICD-10-CM | POA: Diagnosis not present

## 2017-05-01 DIAGNOSIS — G2581 Restless legs syndrome: Secondary | ICD-10-CM

## 2017-05-01 DIAGNOSIS — E119 Type 2 diabetes mellitus without complications: Secondary | ICD-10-CM

## 2017-05-01 DIAGNOSIS — I10 Essential (primary) hypertension: Secondary | ICD-10-CM

## 2017-05-01 DIAGNOSIS — Z794 Long term (current) use of insulin: Secondary | ICD-10-CM | POA: Diagnosis not present

## 2017-05-01 DIAGNOSIS — R232 Flushing: Secondary | ICD-10-CM

## 2017-05-01 DIAGNOSIS — N951 Menopausal and female climacteric states: Secondary | ICD-10-CM | POA: Diagnosis not present

## 2017-05-01 LAB — POCT GLYCOSYLATED HEMOGLOBIN (HGB A1C): Hemoglobin A1C: 8.8

## 2017-05-01 LAB — GLUCOSE, CAPILLARY: Glucose-Capillary: 307 mg/dL — ABNORMAL HIGH (ref 65–99)

## 2017-05-01 MED ORDER — VENLAFAXINE HCL ER 75 MG PO TB24: 75.0000 mg | ORAL_TABLET | Freq: Every day | ORAL | 5 refills | Status: DC

## 2017-05-01 MED ORDER — INSULIN GLARGINE 100 UNIT/ML SOLOSTAR PEN: 35.0000 [IU] | PEN_INJECTOR | Freq: Every day | SUBCUTANEOUS | 11 refills | Status: DC

## 2017-05-01 MED ORDER — ROPINIROLE HCL 0.25 MG PO TABS: 0.2500 mg | ORAL_TABLET | Freq: Every day | ORAL | 1 refills | Status: DC

## 2017-05-01 MED ORDER — PREGABALIN 100 MG PO CAPS: 100.0000 mg | ORAL_CAPSULE | Freq: Two times a day (BID) | ORAL | 5 refills | Status: DC

## 2017-05-01 MED ORDER — SITAGLIPTIN PHOSPHATE 100 MG PO TABS: 100.0000 mg | ORAL_TABLET | Freq: Every day | ORAL | 5 refills | Status: DC

## 2017-05-01 MED ORDER — LIRAGLUTIDE 18 MG/3ML ~~LOC~~ SOPN: 1.2000 mg | PEN_INJECTOR | Freq: Every day | SUBCUTANEOUS | 5 refills | Status: DC

## 2017-05-01 NOTE — Patient Instructions (Signed)
It was a pleasure to see you again [name].  For your diabetes: - Continue Lantus 35 units nightly - Continue Januvia 100 mg daily - Start Victoza (liraglutide) 0.6 mg once a day for 1 week then increase to 1.2 mg daily  I have refilled your Venlafaxine and Lyrica at 100 mg twice a day.  For your restless legs, take Ropinirole 0.25 mg 1-3 hours before bedtime.  We are giving you the flu shot today.  I am referring you to the eye doctor.  Please follow up with me in 3 months or sooner if needed.  Have a safe trip!  Restless Legs Syndrome Restless legs syndrome is a condition that causes uncomfortable feelings or sensations in the legs, especially while sitting or lying down. The sensations usually cause an overwhelming urge to move the legs. The arms can also sometimes be affected. The condition can range from mild to severe. The symptoms often interfere with a person's ability to sleep. What are the causes? The cause of this condition is not known. What increases the risk? This condition is more likely to develop in:  People who are older than age 76.  Pregnant women. In general, restless legs syndrome is more common in women than in men.  People who have a family history of the condition.  People who have certain medical conditions, such as iron deficiency, kidney disease, Parkinson disease, or nerve damage.  People who take certain medicines, such as medicines for high blood pressure, nausea, colds, allergies, depression, and some heart conditions.  What are the signs or symptoms? The main symptom of this condition is uncomfortable sensations in the legs. These sensations may be:  Described as pulling, tingling, prickling, throbbing, crawling, or burning.  Worse while you are sitting or lying down.  Worse during periods of rest or inactivity.  Worse at night, often interfering with your sleep.  Accompanied by a very strong urge to move your legs.  Temporarily  relieved by movement of your legs.  The sensations usually affect both sides of the body. The arms can also be affected, but this is rare. People who have this condition often have tiredness during the day because of their lack of sleep at night. How is this diagnosed? This condition may be diagnosed based on your description of the symptoms. You may also have tests, including blood tests, to check for other conditions that may lead to your symptoms. In some cases, you may be asked to spend some time in a sleep lab so your sleeping can be monitored. How is this treated? Treatment for this condition is focused on managing the symptoms. Treatment may include:  Self-help and lifestyle changes.  Medicines.  Follow these instructions at home:  Take medicines only as directed by your health care provider.  Try these methods to get temporary relief from the uncomfortable sensations: ? Massage your legs. ? Walk or stretch. ? Take a cold or hot bath.  Practice good sleep habits. For example, go to bed and get up at the same time every day.  Exercise regularly.  Practice ways of relaxing, such as yoga or meditation.  Avoid caffeine and alcohol.  Do not use any tobacco products, including cigarettes, chewing tobacco, or electronic cigarettes. If you need help quitting, ask your health care provider.  Keep all follow-up visits as directed by your health care provider. This is important. Contact a health care provider if: Your symptoms do not improve with treatment, or they get worse.  This information is not intended to replace advice given to you by your health care provider. Make sure you discuss any questions you have with your health care provider. Document Released: 06/22/2002 Document Revised: 12/08/2015 Document Reviewed: 06/28/2014 Elsevier Interactive Patient Education  [employer].

## 2017-05-01 NOTE — Progress Notes (Signed)
CC: DM  HPI:  [name] is a 56 y.o. female with PMH as listed below including T2DM, chronic pain, HTN, and fibromyalgia who presents for follow up management of her T2DM.  T2DM: Last A1c was 10.0. She has been on Lantus 35 units qhs (temporarily increased to 55 units while on Prednisone) and Januvia 100 mg daily. She did not bring her meter this visit. She reports checking her CBGs at home 2-3 times per day with recent readings of 177 and 189. She denies any symptomatic highs or lows.  Fibromyalgia: She is Lyrica for her fibromyalgia. She was previously taking 75 mg BID which was increased to 100 mg BID by her pain specialists. She does say the 100 mg twice daily dosing was beneficial and requesting a refill.  Hot flashes: She is taking Venlafaxine ER 75 mg daily with relief and requesting a refill.  Restless Legs Syndrome: Patient with persistent nighttime symptoms interfering with sleep despite oral iron supplementation.   Past Medical History:  Diagnosis Date  . 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 Arthritis   . Diabetes mellitus    oral tx  . GERD (gastroesophageal reflux disease)   . History of post-sterilization tuboplasty 2000  . Plantar fasciitis    Right  . Shortness of breath    with exertion  . Sleep apnea 5 plus yrs   study -pt could not sleep test inconclusive.   . Tear of meniscus of left knee    x2  . Tear of meniscus of right knee    Review of Systems:   Review of Systems  Respiratory: Negative for shortness of breath.   Cardiovascular: Negative for chest pain.  Gastrointestinal: Negative for nausea and vomiting.  Genitourinary: Negative for frequency.  Musculoskeletal: Positive for joint pain. Negative for falls.       Night time leg cramping. Fibromyalgia pain.  Neurological: Negative for dizziness and loss of consciousness.  Endo/Heme/Allergies: Negative for polydipsia.     Physical  Exam:  Vitals:   05/01/17 1328  BP: (!) 150/84  Pulse: 73  Temp: 98 F (36.7 C)  TempSrc: Oral  SpO2: 98%  Weight: 234 lb 12.8 oz (106.5 kg)  Height: 5\' 2"  (1.575 m)   Physical Exam  Constitutional: She is oriented to person, place, and time. She appears well-developed and well-nourished. No distress.  HENT:  Head: Normocephalic and atraumatic.  Cardiovascular: Normal rate and regular rhythm.   Pulmonary/Chest: Effort normal. No respiratory distress. She has no wheezes. She has no rales.  Musculoskeletal: She exhibits no edema.  Neurological: She is alert and oriented to person, place, and time.  Skin: Skin is warm. She is not diaphoretic.    Assessment & Plan:   See Encounters Tab for problem based charting.  Patient discussed with Dr. Lynnae January  Type 2 diabetes mellitus without complication, with long-term current use of insulin ([place]) Repeat A1c this visit is 8.8. Will advise patient to continue her current medications and we will add back Liraglutide which she tolerated well in the past (d/c'ed due to financial issues before). - Continue Lantus 35 units qhs - Continue Januvia 100 mg daily - Start Liraglutide 0.6 mg daily; increase to 1.2 mg daily after 1 week; can go up to 1.8 mg daily if needed - Repeat Hgb A1c in 3 months - Refer to Ophthalmology for annual diabetic retinopathy screening; seen by [facility] in the past  Fibromyalgia Currently stable. She  reports improved control of her pain on Lyrica 100 mg twice daily dosing which I have refilled.  Hot flashes She reports improved control on Venlafaxine ER 75 mg daily which we will continue. Refill sent.  Restless legs syndrome (RLS) Patient with persistent symptoms despite oral iron supplementation. Will add Ropinirole 0.25 mg 1-3 hours before bedtime which can be increased if needed. Consider repeat Ferritin on follow up.   Need for immunization against influenza Flu shot given this visit.

## 2017-05-02 DIAGNOSIS — Z23 Encounter for immunization: Secondary | ICD-10-CM | POA: Insufficient documentation

## 2017-05-02 NOTE — Assessment & Plan Note (Signed)
She reports improved control on Venlafaxine ER 75 mg daily which we will continue. Refill sent.

## 2017-05-02 NOTE — Assessment & Plan Note (Signed)
Flu shot given this visit. 

## 2017-05-02 NOTE — Assessment & Plan Note (Signed)
Repeat A1c this visit is 8.8. Will advise patient to continue her current medications and we will add back Liraglutide which she tolerated well in the past (d/c'ed due to financial issues before). - Continue Lantus 35 units qhs - Continue Januvia 100 mg daily - Start Liraglutide 0.6 mg daily; increase to 1.2 mg daily after 1 week; can go up to 1.8 mg daily if needed - Repeat Hgb A1c in 3 months - Refer to Ophthalmology for annual diabetic retinopathy screening; seen by [facility] in the past

## 2017-05-02 NOTE — Assessment & Plan Note (Signed)
Currently stable. She reports improved control of her pain on Lyrica 100 mg twice daily dosing which I have refilled.

## 2017-05-02 NOTE — Assessment & Plan Note (Signed)
Patient with persistent symptoms despite oral iron supplementation. Will add Ropinirole 0.25 mg 1-3 hours before bedtime which can be increased if needed. Consider repeat Ferritin on follow up.

## 2017-05-03 NOTE — Progress Notes (Signed)
Internal Medicine Clinic Attending  Case discussed with Dr. Patel at the time of the visit.  We reviewed the resident's history and exam and pertinent patient test results.  I agree with the assessment, diagnosis, and plan of care documented in the resident's note.  

## 2017-05-06 ENCOUNTER — Telehealth: Payer: Self-pay | Admitting: *Deleted

## 2017-05-06 DIAGNOSIS — R232 Flushing: Secondary | ICD-10-CM

## 2017-05-06 MED ORDER — EXENATIDE 5 MCG/0.02ML ~~LOC~~ SOPN: 5.0000 ug | PEN_INJECTOR | Freq: Two times a day (BID) | SUBCUTANEOUS | 0 refills | Status: DC

## 2017-05-06 MED ORDER — VENLAFAXINE HCL ER 75 MG PO CP24
75.0000 mg | ORAL_CAPSULE | Freq: Every day | ORAL | Status: DC
Start: 2017-05-06 — End: 2018-01-30

## 2017-05-06 NOTE — Telephone Encounter (Signed)
Patient was previously on Liraglutide. Will try Exenatide (Byetta) 5 mcg twice daily within 60 minutes prior to the two main meals of the day (at least 6 hours apart). Thank you.

## 2017-05-06 NOTE — Telephone Encounter (Signed)
Received faxed PA request from pt's pharmacy for venlafaxine hcl ER 75 mg tabs-pt has medicaid and the preferred for the extended release is capsules-so rx was changed to capsules.  Also received PA request for Victoza 2-pak 18mg /85ml pen.  Victoza is non-preferred.  See medicaid preferred medications below: Byetta Pen Bydureon Pen/Vial Tanzeum Pen Injector    Approval of a non-preferred medication "requires trial and failure or insufficient response to metformin containing products unless contraindicate or documented adverse event when using either a preferred or a non-prefrerred GLP-1 Receptor Agonist and Combination"  Pt has metformin on her allergy list, but I will need to find out if she has tried and failed the preferred or if the preferred is contraindicated.  Will send to pcp for review, please advise.[name]10/22/201811:15 AM

## 2017-05-07 NOTE — Telephone Encounter (Addendum)
[id number] W  PHARMACY [phone] K  [name] 05/07/2017  APPROVED 05/07/2017 - 05/07/2018 [facility]  Request approved.[name]10/23/20183:53 PM

## 2017-05-07 NOTE — Telephone Encounter (Signed)
Although Byetta is the preferred it stills requires a PA.  PA request submitted online via [employer]. May take up to 48 hours for a decision.  Also attempted to contact patient to make her aware of the change-no answer, no message left .[name]10/23/20182:58 PM

## 2017-05-09 ENCOUNTER — Other Ambulatory Visit: Payer: Self-pay

## 2017-05-09 NOTE — Telephone Encounter (Signed)
Needs to speak with a nurse about meds. Please call pt back.  

## 2017-05-10 NOTE — Telephone Encounter (Signed)
Rang and rang, no answer

## 2017-05-13 NOTE — Telephone Encounter (Signed)
Spoke with patient and informed her that insurance would not pay for victoza and byetta was the preferred.  MD has changed medication to byetta. Pt aware, no further action needed. Phone call complete.[name]10/29/20181:28 PM

## 2017-06-14 ENCOUNTER — Telehealth: Payer: Self-pay | Admitting: Internal Medicine

## 2017-06-14 NOTE — Telephone Encounter (Signed)
Went straight to vmail and then recording stated vmail full

## 2017-06-14 NOTE — Telephone Encounter (Signed)
Patient wants to talk about the side effects from medicine. Patient wants [name] to call her back

## 2017-06-18 ENCOUNTER — Telehealth: Payer: Self-pay | Admitting: *Deleted

## 2017-06-18 NOTE — Telephone Encounter (Signed)
liraglutide is causing pt nausea, weak and blurry vision, she has stopped using it. This am cbg was 226. Has not taken in 2 weeks Please advise

## 2017-06-19 NOTE — Telephone Encounter (Signed)
Ok to hold Liraglutide if it is causing side effects. She will need to follow up with [name] in mid-late January 2019.

## 2017-06-21 NOTE — Telephone Encounter (Signed)
Lm for rtc 

## 2017-06-26 NOTE — Telephone Encounter (Signed)
Spoke w/ pt, done

## 2017-07-12 ENCOUNTER — Other Ambulatory Visit: Payer: Self-pay

## 2017-07-12 ENCOUNTER — Ambulatory Visit: Payer: Medicaid Other | Admitting: Internal Medicine

## 2017-07-12 DIAGNOSIS — Z79899 Other long term (current) drug therapy: Secondary | ICD-10-CM | POA: Diagnosis not present

## 2017-07-12 DIAGNOSIS — G2581 Restless legs syndrome: Secondary | ICD-10-CM

## 2017-07-12 DIAGNOSIS — M720 Palmar fascial fibromatosis [Dupuytren]: Secondary | ICD-10-CM

## 2017-07-12 DIAGNOSIS — R252 Cramp and spasm: Secondary | ICD-10-CM

## 2017-07-12 MED ORDER — MELOXICAM 7.5 MG PO TABS: 7.5000 mg | ORAL_TABLET | Freq: Two times a day (BID) | ORAL | 0 refills | Status: DC | PRN

## 2017-07-12 MED ORDER — B COMPLEX VITAMINS PO CAPS: 1.0000 | ORAL_CAPSULE | Freq: Every day | ORAL | 1 refills | Status: DC

## 2017-07-12 NOTE — Progress Notes (Signed)
   CC: Muscle spasms  HPI:  [name] is a 56 y.o. female with a past medical history listed below here today with complaints of muscle spasms.   She reports she continues to have muscle spasms in both her arms and legs. She reports that the spasms in her arms cause her fingers to contract and are very painful for her. These typically occur during the day and can last for 1-2 hours at a time. She reports trying numerous things including muscle relaxers for the spasms without any benefit. Reports these have been occurring for the past month and and worsening in severity and frequency. Most recent lab work without any abnormalities. She has been seen at [facility] and is s/p right palmar fasciectomy for dupuytren's disease and now has signs of disease in her left hand as well. Plan for surgery there. She also has complaints of bilateral leg cramps that occur at night. She has been diagnosed with RLS. She has been started on iron supplementation as well as Ropinirole qhs. Reports symptoms are stable without any improvement.     Past Medical History:  Diagnosis Date  . 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 Arthritis   . Diabetes mellitus    oral tx  . GERD (gastroesophageal reflux disease)   . History of post-sterilization tuboplasty 2000  . Plantar fasciitis    Right  . Shortness of breath    with exertion  . Sleep apnea 5 plus yrs   study -pt could not sleep test inconclusive.   . Tear of meniscus of left knee    x2  . Tear of meniscus of right knee    Review of Systems:   No chest pain or shortness of breath  Physical Exam:  Vitals:   07/12/17 1420  BP: (!) 169/83  Pulse: 78  Temp: 97.6 F (36.4 C)  TempSrc: Oral  SpO2: 98%  Weight: 231 lb 4.8 oz (104.9 kg)  Height: 5\' 2"  (1.575 m)   GENERAL- alert, co-operative, appears as stated age, not in any distress. HEENT- Atraumatic, normocephalic,  PERRL, EOMI, oral mucosa appears moist CARDIAC- RRR, no murmurs, rubs or gallops. RESP- Moving equal volumes of air, and clear to auscultation bilaterally, no wheezes or crackles. ABDOMEN- Soft, nontender, bowel sounds present. NEURO- No obvious Cr N abnormality. EXTREMITIES- pulse 2+, symmetric, no pedal edema. SKIN- Warm, dry, No rash or lesion. PSYCH- Normal mood and affect, appropriate thought content and speech.  Assessment & Plan:   See Encounters Tab for problem based charting.  Patient discussed with Dr. Rebeca Alert

## 2017-07-12 NOTE — Patient Instructions (Signed)
Ms. [place],  I would like for you to start taking vitamin B complex vitamins. This can sometimes help with cramping. I would also recommend taking a small spoonful of mustard or vinegar when you have the cramps. Sometimes this can shock the body out of the cramps.   Please follow up with your PCP.

## 2017-07-17 DIAGNOSIS — R252 Cramp and spasm: Secondary | ICD-10-CM | POA: Insufficient documentation

## 2017-07-17 NOTE — Assessment & Plan Note (Signed)
No obvious etiology for her hand cramps. May be related to her Dupuytren's contractures. Being seen at [facility] for this. Recommend follow up there.

## 2017-07-17 NOTE — Assessment & Plan Note (Signed)
Symptoms stable. Will continue current medications today. Will start patient on B6 complex. Follow up with PCP for further management.

## 2017-07-18 NOTE — Progress Notes (Signed)
Internal Medicine Clinic Attending  Case discussed with Dr. Boswell  at the time of the visit.  We reviewed the resident's history and exam and pertinent patient test results.  I agree with the assessment, diagnosis, and plan of care documented in the resident's note.  Alexander N Raines, MD   

## 2017-07-22 ENCOUNTER — Telehealth: Payer: Self-pay | Admitting: Internal Medicine

## 2017-07-22 NOTE — Telephone Encounter (Signed)
PT HAD TO CANCEL APPT. AT [facility], SHE HAD FLU. AFTER TALKING TO THE PAIN SITE THEY WILL ONLY GIVE HER THE SAME MEDICATION  DR. PATEL HAS HER ON.

## 2017-07-23 ENCOUNTER — Ambulatory Visit: Payer: Medicaid Other

## 2017-07-24 ENCOUNTER — Telehealth: Payer: Self-pay | Admitting: *Deleted

## 2017-07-24 ENCOUNTER — Other Ambulatory Visit: Payer: Self-pay

## 2017-07-24 ENCOUNTER — Encounter: Payer: Self-pay | Admitting: Internal Medicine

## 2017-07-24 ENCOUNTER — Ambulatory Visit: Payer: Medicaid Other | Admitting: Internal Medicine

## 2017-07-24 VITALS — BP 155/78 | HR 83 | Temp 97.8°F | Ht 62.0 in | Wt 232.1 lb

## 2017-07-24 DIAGNOSIS — Z79891 Long term (current) use of opiate analgesic: Secondary | ICD-10-CM

## 2017-07-24 DIAGNOSIS — I10 Essential (primary) hypertension: Secondary | ICD-10-CM

## 2017-07-24 DIAGNOSIS — M797 Fibromyalgia: Secondary | ICD-10-CM | POA: Diagnosis not present

## 2017-07-24 DIAGNOSIS — G8929 Other chronic pain: Secondary | ICD-10-CM | POA: Diagnosis not present

## 2017-07-24 DIAGNOSIS — E119 Type 2 diabetes mellitus without complications: Secondary | ICD-10-CM

## 2017-07-24 DIAGNOSIS — Z79899 Other long term (current) drug therapy: Secondary | ICD-10-CM | POA: Diagnosis not present

## 2017-07-24 DIAGNOSIS — M720 Palmar fascial fibromatosis [Dupuytren]: Secondary | ICD-10-CM

## 2017-07-24 DIAGNOSIS — G2581 Restless legs syndrome: Secondary | ICD-10-CM | POA: Diagnosis not present

## 2017-07-24 DIAGNOSIS — M5126 Other intervertebral disc displacement, lumbar region: Secondary | ICD-10-CM | POA: Diagnosis not present

## 2017-07-24 MED ORDER — OXYCODONE HCL 10 MG PO TABS: 10.0000 mg | ORAL_TABLET | Freq: Four times a day (QID) | ORAL | 0 refills | Status: DC | PRN

## 2017-07-24 MED ORDER — ROPINIROLE HCL 0.5 MG PO TABS: 0.5000 mg | ORAL_TABLET | Freq: Every day | ORAL | 2 refills | Status: DC

## 2017-07-24 NOTE — Patient Instructions (Signed)
It was a pleasure to see you again [name].  I have refilled your pain medicine Oxycodone 10 mg to take as needed up to four times per day (28 tablets) for until you see your pain specialist again.  Please be sure to keep your appointment with them on 07/30/17 at 3 pm.  I will send a letter to their office to let them know I have given you a short refill of the pain medicine.  I have also increased your Ropinirole for your restless legs.  Please follow up with me as scheduled next week.

## 2017-07-24 NOTE — Telephone Encounter (Signed)
Addressed in [facility] 07/24/17.

## 2017-07-24 NOTE — Telephone Encounter (Signed)
Call from Leitha Bleak at [facility] - stated she received letter from Dr Posey Pronto about given pt medication (Oxycodone) which she appreciated. Just wanted make  Dr Posey Pronto aware pt had canceled 3 appts; last one was b/c pt c/o muscle spasms. And stated to take her off their schedule. Stated usually they dismissed pts for multiple cancellations and accepting med from another doctor. But she will not do this and pt has an appt to see them on the 15th. FYI.

## 2017-07-24 NOTE — Progress Notes (Deleted)
   CC: Pain  HPI:  [name] is a 57 y.o. female with PMH as listed below including T2DM, chronic pain, HTN, and fibromyalgia who presents for follow up management of her chronic pain.    Past Medical History:  Diagnosis Date  . 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 Arthritis   . Diabetes mellitus    oral tx  . GERD (gastroesophageal reflux disease)   . History of post-sterilization tuboplasty 2000  . Plantar fasciitis    Right  . Shortness of breath    with exertion  . Sleep apnea 5 plus yrs   study -pt could not sleep test inconclusive.   . Tear of meniscus of left knee    x2  . Tear of meniscus of right knee    Review of Systems:   Review of Systems  Constitutional: Positive for malaise/fatigue.  Respiratory: Negative for shortness of breath.   Gastrointestinal: Negative for diarrhea, nausea and vomiting.  Musculoskeletal: Positive for back pain, joint pain and myalgias.       Leg cramps  Neurological: Positive for tingling and weakness.     Physical Exam:  Vitals:   07/24/17 1030  BP: (!) 155/78  Pulse: 83  Temp: 97.8 F (36.6 C)  TempSrc: Oral  SpO2: 100%  Weight: 232 lb 1.6 oz (105.3 kg)  Height: 5\' 2"  (1.575 m)   Physical Exam  Constitutional: She is oriented to person, place, and time. She appears well-developed.  Face flushed  Eyes: Pupils are equal, round, and reactive to light.  Cardiovascular: Normal rate and regular rhythm.  Pulmonary/Chest: Effort normal. No respiratory distress. She has no wheezes. She has no rales.  Musculoskeletal:  Tender to palpation both hips  Neurological: She is alert and oriented to person, place, and time.  Skin: Skin is warm.    Assessment & Plan:   See Encounters Tab for problem based charting.  Patient discussed with Dr. Dareen Piano

## 2017-07-24 NOTE — Telephone Encounter (Signed)
Thank you [name]... 

## 2017-07-25 NOTE — Progress Notes (Signed)
CC: Pain  HPI:  [name] is a 57 y.o. female with PMH as listed below including T2DM, chronic pain, HTN, and fibromyalgia who presents for follow up management of her chronic pain. Please see problem based charting for status of patient's chronic medical issues.  Long-term current use of opiate analgesic She has chronic pain from her fibromyalgia, MSK joint pain, dupuytren contractures, and L4-5 disc protrusion. She is now following with pain management ([facility] in [place], [place]). She has been taking Oxycodone 10 mg four times a day as needed. She ran out of her medication and missed her follow up pain management appointment due to illness and muscle spasms. She did not reschedule as she says the next opening would be on 08/05/17. She ran out of her medications 4-5 days ago and has been experiencing worsened pain, spasms, cramping, restlessness, and uneasiness. She is requesting a short refill until she can see her pain specialist again as she wants to continue to follow there.  A/P: She called her pain management clinic while I was in the room and scheduled an appointment on 07/30/17. She is on a pain contract with her pain management specialists. I am concerned that she may have been having withdrawal symptoms earlier and agreed to provide a one time refill on her Oxycodone 10 mg four times a day as needed for 1 week (#28) until she is seen by her pain clinic again. I notified her Pain Management Provider as well. I reviewed the [place] Controlled Substance Database which appears appropriate.  Restless legs syndrome (RLS) She reports improvement with Ropinirole but has continued symptoms. A/P: Restless leg symptoms improving but still not adequately controlled. Will increase and refill Ropinirole to 0.5 mg qhs.    Past Medical History:  Diagnosis Date  . 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 Arthritis   . Diabetes  mellitus    oral tx  . GERD (gastroesophageal reflux disease)   . History of post-sterilization tuboplasty 2000  . Plantar fasciitis    Right  . Shortness of breath    with exertion  . Sleep apnea 5 plus yrs   study -pt could not sleep test inconclusive.   . Tear of meniscus of left knee    x2  . Tear of meniscus of right knee    Review of Systems:   Review of Systems  Constitutional: Positive for malaise/fatigue.  Respiratory: Negative for shortness of breath.   Gastrointestinal: Negative for diarrhea, nausea and vomiting.  Musculoskeletal: Positive for back pain, joint pain and myalgias.       Leg cramps  Neurological: Positive for tingling and weakness.     Physical Exam:  Vitals:   07/24/17 1030  BP: (!) 155/78  Pulse: 83  Temp: 97.8 F (36.6 C)  TempSrc: Oral  SpO2: 100%  Weight: 232 lb 1.6 oz (105.3 kg)  Height: 5\' 2"  (1.575 m)   Physical Exam  Constitutional: She is oriented to person, place, and time. She appears well-developed.  Face flushed  Eyes: Pupils are equal, round, and reactive to light.  Cardiovascular: Normal rate and regular rhythm.  Pulmonary/Chest: Effort normal. No respiratory distress. She has no wheezes. She has no rales.  Musculoskeletal:  Tender to palpation both hips  Neurological: She is alert and oriented to person, place, and time.  Repetitive rubbing of hands on her thighs, palms blue from jean fabric.  Skin: Skin is warm.  Assessment & Plan:   See Encounters Tab for problem based charting.  Patient discussed with Dr. Dareen Piano

## 2017-07-25 NOTE — Assessment & Plan Note (Signed)
She reports improvement with Ropinirole but has continued symptoms. A/P: Restless leg symptoms improving but still not adequately controlled. Will increase and refill Ropinirole to 0.5 mg qhs.

## 2017-07-25 NOTE — Assessment & Plan Note (Signed)
She has chronic pain from her fibromyalgia, MSK joint pain, dupuytren contractures, and L4-5 disc protrusion. She is now following with pain management ([facility] in [place], [place]). She has been taking Oxycodone 10 mg four times a day as needed. She ran out of her medication and missed her follow up pain management appointment due to illness and muscle spasms. She did not reschedule as she says the next opening would be on 08/05/17. She ran out of her medications 4-5 days ago and has been experiencing worsened pain, spasms, cramping, restlessness, and uneasiness. She is requesting a short refill until she can see her pain specialist again as she wants to continue to follow there.  A/P: She called her pain management clinic while I was in the room and scheduled an appointment on 07/30/17. She is on a pain contract with her pain management specialists. I am concerned that she may have been having withdrawal symptoms earlier and agreed to provide a one time refill on her Oxycodone 10 mg four times a day as needed for 1 week (#28) until she is seen by her pain clinic again. I notified her Pain Management Provider as well. I reviewed the [place] Controlled Substance Database which appears appropriate.

## 2017-07-26 NOTE — Progress Notes (Signed)
Internal Medicine Clinic Attending  Case discussed with Dr. Patel at the time of the visit.  We reviewed the resident's history and exam and pertinent patient test results.  I agree with the assessment, diagnosis, and plan of care documented in the resident's note.  

## 2017-07-31 ENCOUNTER — Other Ambulatory Visit: Payer: Self-pay

## 2017-07-31 ENCOUNTER — Encounter: Payer: Self-pay | Admitting: Internal Medicine

## 2017-07-31 ENCOUNTER — Ambulatory Visit: Payer: Medicaid Other | Admitting: Internal Medicine

## 2017-07-31 VITALS — BP 136/69 | HR 93 | Temp 98.2°F | Ht 62.0 in | Wt 225.6 lb

## 2017-07-31 DIAGNOSIS — G8929 Other chronic pain: Secondary | ICD-10-CM | POA: Diagnosis not present

## 2017-07-31 DIAGNOSIS — I152 Hypertension secondary to endocrine disorders: Secondary | ICD-10-CM

## 2017-07-31 DIAGNOSIS — M5126 Other intervertebral disc displacement, lumbar region: Secondary | ICD-10-CM

## 2017-07-31 DIAGNOSIS — I10 Essential (primary) hypertension: Secondary | ICD-10-CM

## 2017-07-31 DIAGNOSIS — M797 Fibromyalgia: Secondary | ICD-10-CM | POA: Diagnosis not present

## 2017-07-31 DIAGNOSIS — E119 Type 2 diabetes mellitus without complications: Secondary | ICD-10-CM

## 2017-07-31 DIAGNOSIS — Z794 Long term (current) use of insulin: Secondary | ICD-10-CM | POA: Diagnosis not present

## 2017-07-31 DIAGNOSIS — M720 Palmar fascial fibromatosis [Dupuytren]: Secondary | ICD-10-CM

## 2017-07-31 DIAGNOSIS — Z0000 Encounter for general adult medical examination without abnormal findings: Secondary | ICD-10-CM

## 2017-07-31 DIAGNOSIS — Z79891 Long term (current) use of opiate analgesic: Secondary | ICD-10-CM | POA: Diagnosis not present

## 2017-07-31 DIAGNOSIS — E1159 Type 2 diabetes mellitus with other circulatory complications: Secondary | ICD-10-CM

## 2017-07-31 LAB — POCT GLYCOSYLATED HEMOGLOBIN (HGB A1C): Hemoglobin A1C: 10.5

## 2017-07-31 LAB — GLUCOSE, CAPILLARY: Glucose-Capillary: 347 mg/dL — ABNORMAL HIGH (ref 65–99)

## 2017-07-31 MED ORDER — LIRAGLUTIDE 18 MG/3ML ~~LOC~~ SOPN: PEN_INJECTOR | SUBCUTANEOUS | 2 refills | Status: DC

## 2017-07-31 MED ORDER — CYCLOBENZAPRINE HCL 10 MG PO TABS: 10.0000 mg | ORAL_TABLET | Freq: Two times a day (BID) | ORAL | 0 refills | Status: DC | PRN

## 2017-07-31 NOTE — Assessment & Plan Note (Signed)
Stool cards given for colorectal cancer screening. She is advised that this may be yearly screening if negative. If positive, she will be referred for colonoscopy. She understands and agrees.
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
## 2017-07-31 NOTE — Progress Notes (Signed)
CC: Diabetes  HPI:  [name] is a 57 y.o. female with PMH as listed below including T2DM, chronic pain, HTN, and fibromyalgia who presents for follow up management of her diabetes. Please see problem based charting for status of patient's chronic medical issues.  Type 2 diabetes mellitus without complication, with long-term current use of insulin (HCC) Last A1c was 8.8 on 05/01/17. She has been taking Lantus 35 units qhs and Sitagliptin 100 mg daily. Her insurance declined Victoza on last visit and she was then started on Byetta. She reports intolerance to the Byetta due to side effects of nausea and vomiting. She has not tolerated Metformin in the past due to GI side effects. She does report tolerance to Victoza in the past which was d/c'ed at the time due to financial issues.   She does admit to missing her Lantus for a few days last month when she was snowed in at a relative's house. She checks her CBGs 2x a day, before breakfast and before bedtime. She did not bring her meter. She reports seeing most morning values around 120s and evening values around 220s. No recent hypoglycemic or hyperglycemic symptoms. She was referred to [facility] in October 2018 and is awaiting scheduling. A/P: Repeat A1c is 10.5. Her diabetes is uncontrolled. We will retry Victoza now as she was intolerant to Byetta. She is counseled on healthy eating patterns. - Start Victoza (liraglutide) 0.6 mg daily for 7 days then increase to 1.2 mg daily - Continue Lantus 35 units qhs - Continue Januvia 100 mg daily for now; consider d/c if she is able to start Victoza; can consider SGLT2 Inhibitor in the future - Repeat A1c in 3 months - Referred to [facility]  Hypertension associated with diabetes [facility]) She is off of antihypertensives. BP Readings from Last 3 Encounters:  07/31/17 136/69  07/24/17 (!) 155/78  07/12/17 (!) 169/83  A/P: BP is stable off of antihypertensives. Recommend healthy eating  patterns. Continue to monitor.  Long-term current use of opiate analgesic She has chronic pain from her fibromyalgia, MSK joint pain, dupuytren contractures, and L4-5 disc protrusion. She is following with pain management ([facility] in [place], [place]). She was seen in our clinic last week for pain as she ran out of her medications and was unable to be seen by her pain specialist. I did provide a one time short course of her current pain medicine (Oxycodone 10 mg four times a day as needed) until she was able to follow up with her pain clinic. She did follow up with them yesterday (07/30/17) and says she was given a refill for her medications as well as a prescription for Lidoderm patches. A/P: She has chronic pain, now following with a pain specialist. She will continue to follow with them and is advised to keep her appointments. She has been getting Oxycodone 10 mg four times a day as needed #120/month from her pain clinic. Her other pain medications include Flexeril as needed and Lyrica 100 mg BID.  Preventative health care Stool cards given for colorectal cancer screening. She is advised that this may be yearly screening if negative. If positive, she will be referred for colonoscopy. She understands and agrees.    Past Medical History:  Diagnosis Date  . 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 Arthritis   . Diabetes mellitus    oral tx  . GERD (gastroesophageal reflux disease)   .  History of post-sterilization tuboplasty 2000  . Plantar fasciitis    Right  . Shortness of breath    with exertion  . Sleep apnea 5 plus yrs   study -pt could not sleep test inconclusive.   . Tear of meniscus of left knee    x2  . Tear of meniscus of right knee    Review of Systems:   Review of Systems  Constitutional: Negative for weight loss.  Cardiovascular: Negative for leg swelling.  Gastrointestinal: Negative for nausea and vomiting.    Musculoskeletal: Positive for back pain and joint pain.  Skin: Positive for itching.  Neurological: Negative for dizziness.     Physical Exam:  Vitals:   07/31/17 1507  BP: 136/69  Pulse: 93  Temp: 98.2 F (36.8 C)  TempSrc: Oral  SpO2: 98%  Weight: 225 lb 9.6 oz (102.3 kg)  Height: 5\' 2"  (1.575 m)   Physical Exam  Constitutional: She is oriented to person, place, and time. She appears well-nourished. No distress.  HENT:  Head: Normocephalic and atraumatic.  Cardiovascular: Normal rate and regular rhythm.  No murmur heard. Pulmonary/Chest: Effort normal. No respiratory distress. She has no wheezes. She has no rales.  Musculoskeletal: She exhibits no edema.  Neurological: She is alert and oriented to person, place, and time.  Skin: She is not diaphoretic.    Assessment & Plan:   See Encounters Tab for problem based charting.  Patient discussed with Dr. Angelia Mould

## 2017-07-31 NOTE — Patient Instructions (Signed)
It was a pleasure to see you again [name].  Your hemoglobin A1c was high today at 10.5. Please work on healthy eating patterns.  We will start Victoza (Liraglutide). Start with 0.6 mg daily for 7 days then increase to 1.2 mg daily. This may help with weight loss too.  Continue Lantus 35 units every night and Januvia 100 mg daily.  We will provide stool cards for colorectal cancer screening. If this is negative we can repeat next year. If positive, I will refer you for a colonoscopy.  Please follow up with me in 3 months or sooner if needed.  Liraglutide injection What is this medicine? LIRAGLUTIDE (LIR a GLOO tide) is used to improve blood sugar control in adults with type 2 diabetes. This medicine may be used with other diabetes medicines. This drug may also reduce the risk of heart attack or stroke if you have type 2 diabetes and risk factors for heart disease. This medicine may be used for other purposes; ask your health care provider or pharmacist if you have questions. COMMON BRAND NAME(S): Victoza What should I tell my health care provider before I take this medicine? They need to know if you have any of these conditions: -endocrine tumors (MEN 2) or if someone in your family had these tumors -gallbladder disease -high cholesterol -history of alcohol abuse problem -history of pancreatitis -kidney disease or if you are on dialysis -liver disease -previous swelling of the tongue, face, or lips with difficulty breathing, difficulty swallowing, hoarseness, or tightening of the throat -stomach problems -thyroid cancer or if someone in your family had thyroid cancer -an unusual or allergic reaction to liraglutide, other medicines, foods, dyes, or preservatives -pregnant or trying to get pregnant -breast-feeding How should I use this medicine? This medicine is for injection under the skin of your upper leg, stomach area, or upper arm. You will be taught how to prepare and give  this medicine. Use exactly as directed. Take your medicine at regular intervals. Do not take it more often than directed. It is important that you put your used needles and syringes in a special sharps container. Do not put them in a trash can. If you do not have a sharps container, call your pharmacist or healthcare provider to get one. A special MedGuide will be given to you by the pharmacist with each prescription and refill. Be sure to read this information carefully each time. Talk to your pediatrician regarding the use of this medicine in children. Special care may be needed. Overdosage: If you think you have taken too much of this medicine contact a poison control center or emergency room at once. NOTE: This medicine is only for you. Do not share this medicine with others. What if I miss a dose? If you miss a dose, take it as soon as you can. If it is almost time for your next dose, take only that dose. Do not take double or extra doses. What may interact with this medicine? -other medicines for diabetes Many medications may cause changes in blood sugar, these include: -alcohol containing beverages -antiviral medicines for HIV or AIDS -aspirin and aspirin-like drugs -certain medicines for blood pressure, heart disease, irregular heart beat -chromium -diuretics -female hormones, such as estrogens or progestins, birth control pills -fenofibrate -gemfibrozil -isoniazid -lanreotide -female hormones or anabolic steroids -MAOIs like Carbex, Eldepryl, Marplan, Nardil, and Parnate -medicines for weight loss -medicines for allergies, asthma, cold, or cough -medicines for depression, anxiety, or psychotic disturbances -niacin -nicotine -NSAIDs,  medicines for pain and inflammation, like ibuprofen or naproxen -octreotide -pasireotide -pentamidine -phenytoin -probenecid -quinolone antibiotics such as ciprofloxacin, levofloxacin, ofloxacin -some herbal dietary supplements -steroid  medicines such as prednisone or cortisone -sulfamethoxazole; trimethoprim -thyroid hormones Some medications can hide the warning symptoms of low blood sugar (hypoglycemia). You may need to monitor your blood sugar more closely if you are taking one of these medications. These include: -beta-blockers, often used for high blood pressure or heart problems (examples include atenolol, metoprolol, propranolol) -clonidine -guanethidine -reserpine This list may not describe all possible interactions. Give your health care provider a list of all the medicines, herbs, non-prescription drugs, or dietary supplements you use. Also tell them if you smoke, drink alcohol, or use illegal drugs. Some items may interact with your medicine. What should I watch for while using this medicine? Visit your doctor or health care professional for regular checks on your progress. Drink plenty of fluids while taking this medicine. Check with your doctor or health care professional if you get an attack of severe diarrhea, nausea, and vomiting. The loss of too much body fluid can make it dangerous for you to take this medicine. A test called the HbA1C (A1C) will be monitored. This is a simple blood test. It measures your blood sugar control over the last 2 to 3 months. You will receive this test every 3 to 6 months. Learn how to check your blood sugar. Learn the symptoms of low and high blood sugar and how to manage them. Always carry a quick-source of sugar with you in case you have symptoms of low blood sugar. Examples include hard sugar candy or glucose tablets. Make sure others know that you can choke if you eat or drink when you develop serious symptoms of low blood sugar, such as seizures or unconsciousness. They must get medical help at once. Tell your doctor or health care professional if you have high blood sugar. You might need to change the dose of your medicine. If you are sick or exercising more than usual, you might  need to change the dose of your medicine. Do not skip meals. Ask your doctor or health care professional if you should avoid alcohol. Many nonprescription cough and cold products contain sugar or alcohol. These can affect blood sugar. Pens should never be shared. Even if the needle is changed, sharing may result in passing of viruses like hepatitis or HIV. Wear a medical ID bracelet or chain, and carry a card that describes your disease and details of your medicine and dosage times. What side effects may I notice from receiving this medicine? Side effects that you should report to your doctor or health care professional as soon as possible: -allergic reactions like skin rash, itching or hives, swelling of the face, lips, or tongue -breathing problems -diarrhea that continues or is severe -lump or swelling on the neck -severe nausea -signs and symptoms of infection like fever or chills; cough; sore throat; pain or trouble passing urine -signs and symptoms of low blood sugar such as feeling anxious, confusion, dizziness, increased hunger, unusually weak or tired, sweating, shakiness, cold, irritable, headache, blurred vision, fast heartbeat, loss of consciousness -signs and symptoms of kidney injury like trouble passing urine or change in the amount of urine -trouble swallowing -unusual stomach upset or pain -vomiting Side effects that usually do not require medical attention (report to your doctor or health care professional if they continue or are bothersome): -constipation -decreased appetite -diarrhea -fatigue -headache -nausea -pain, redness, or  irritation at site where injected -stomach upset -stuffy or runny nose This list may not describe all possible side effects. Call your doctor for medical advice about side effects. You may report side effects to FDA at 1-800-FDA-1088. Where should I keep my medicine? Keep out of the reach of children. Store unopened pen in a refrigerator  between 2 and 8 degrees C (36 and 46 degrees F). Do not freeze or use if the medicine has been frozen. Protect from light and excessive heat. After you first use the pen, it can be stored at room temperature between 15 and 30 degrees C (59 and 86 degrees F) or in a refrigerator. Throw away your used pen after 30 days or after the expiration date, whichever comes first. Do not store your pen with the needle attached. If the needle is left on, medicine may leak from the pen. NOTE: This sheet is a summary. It may not cover all possible information. If you have questions about this medicine, talk to your doctor, pharmacist, or health care provider.  2018 Elsevier/Gold Standard (2016-07-19 14:39:40)

## 2017-07-31 NOTE — Assessment & Plan Note (Signed)
She is off of antihypertensives. BP Readings from Last 3 Encounters:  07/31/17 136/69  07/24/17 (!) 155/78  07/12/17 (!) 169/83  A/P: BP is stable off of antihypertensives. Recommend healthy eating patterns. Continue to monitor.

## 2017-07-31 NOTE — Assessment & Plan Note (Signed)
She has chronic pain from her fibromyalgia, MSK joint pain, dupuytren contractures, and L4-5 disc protrusion. She is following with pain management ([facility] in [place], [place]). She was seen in our clinic last week for pain as she ran out of her medications and was unable to be seen by her pain specialist. I did provide a one time short course of her current pain medicine (Oxycodone 10 mg four times a day as needed) until she was able to follow up with her pain clinic. She did follow up with them yesterday (07/30/17) and says she was given a refill for her medications as well as a prescription for Lidoderm patches. A/P: She has chronic pain, now following with a pain specialist. She will continue to follow with them and is advised to keep her appointments. She has been getting Oxycodone 10 mg four times a day as needed #120/month from her pain clinic. Her other pain medications include Flexeril as needed and Lyrica 100 mg BID.

## 2017-08-02 ENCOUNTER — Telehealth: Payer: Self-pay | Admitting: *Deleted

## 2017-08-02 NOTE — Telephone Encounter (Addendum)
Information was sent to [place] for PA for Victoza 2 pak.  Awaiting decision from [facility].  Sander Nephew, RN 08/02/2017 3:13 PM. PA for Victoza 1/18/2019thru 08/02/2018.  Gladys Herbin,RN 08/13/2017 1:24 PM.

## 2017-08-06 NOTE — Progress Notes (Signed)
Internal Medicine Clinic Attending  Case discussed with Dr. Patel at the time of the visit.  We reviewed the resident's history and exam and pertinent patient test results.  I agree with the assessment, diagnosis, and plan of care documented in the resident's note.  

## 2017-08-13 ENCOUNTER — Telehealth: Payer: Self-pay | Admitting: Internal Medicine

## 2017-08-13 NOTE — Telephone Encounter (Signed)
Pt inquiring about her victoza rx.  Pt states she has tried and failed byetta and is now requsting a PA for the victoza.  Will have the appropriate staff follow up on PA and call pt with update.Regenia Skeeter, Darlene Cassady1/29/201910:38 AM

## 2017-08-13 NOTE — Telephone Encounter (Signed)
Patient is calling about refills, she is checking the status

## 2017-08-27 ENCOUNTER — Other Ambulatory Visit: Payer: Self-pay | Admitting: Internal Medicine

## 2017-08-27 ENCOUNTER — Telehealth: Payer: Self-pay

## 2017-08-27 DIAGNOSIS — G2581 Restless legs syndrome: Secondary | ICD-10-CM

## 2017-08-27 NOTE — Telephone Encounter (Signed)
Would like [name] to call back.  

## 2017-09-12 DIAGNOSIS — M533 Sacrococcygeal disorders, not elsewhere classified: Secondary | ICD-10-CM | POA: Insufficient documentation

## 2017-09-25 ENCOUNTER — Other Ambulatory Visit: Payer: Self-pay | Admitting: Internal Medicine

## 2017-09-25 ENCOUNTER — Ambulatory Visit: Payer: Medicaid Other | Admitting: Internal Medicine

## 2017-09-25 ENCOUNTER — Telehealth: Payer: Self-pay | Admitting: Dietician

## 2017-09-25 ENCOUNTER — Encounter: Payer: Self-pay | Admitting: Internal Medicine

## 2017-09-25 VITALS — BP 139/68 | HR 86 | Temp 98.1°F | Ht 62.0 in | Wt 234.2 lb

## 2017-09-25 DIAGNOSIS — E119 Type 2 diabetes mellitus without complications: Secondary | ICD-10-CM | POA: Diagnosis not present

## 2017-09-25 DIAGNOSIS — G5600 Carpal tunnel syndrome, unspecified upper limb: Secondary | ICD-10-CM

## 2017-09-25 DIAGNOSIS — Z794 Long term (current) use of insulin: Secondary | ICD-10-CM | POA: Diagnosis not present

## 2017-09-25 DIAGNOSIS — M797 Fibromyalgia: Secondary | ICD-10-CM | POA: Diagnosis not present

## 2017-09-25 DIAGNOSIS — I10 Essential (primary) hypertension: Secondary | ICD-10-CM | POA: Diagnosis not present

## 2017-09-25 DIAGNOSIS — G8929 Other chronic pain: Secondary | ICD-10-CM | POA: Diagnosis not present

## 2017-09-25 MED ORDER — INSULIN PEN NEEDLE 31G X 8 MM MISC: 6 refills | Status: DC

## 2017-09-25 MED ORDER — INSULIN ASPART 100 UNIT/ML ~~LOC~~ SOLN: 8.0000 [IU] | Freq: Three times a day (TID) | SUBCUTANEOUS | 11 refills | Status: DC

## 2017-09-25 MED ORDER — EMPAGLIFLOZIN 10 MG PO TABS: 10.0000 mg | ORAL_TABLET | Freq: Every day | ORAL | 2 refills | Status: DC

## 2017-09-25 MED ORDER — MELOXICAM 7.5 MG PO TABS: 7.5000 mg | ORAL_TABLET | Freq: Every day | ORAL | 0 refills | Status: DC | PRN

## 2017-09-25 NOTE — Progress Notes (Signed)
CC: Diabetes  HPI:  [name] is a 57 y.o. female with past medical history as listed below including type 2 diabetes, chronic pain, hypertension, and fibromyalgia who presents for follow-up management of her diabetes.  Please see problem based charting for status of patient's chronic medical issues.  Type 2 diabetes mellitus without complication, with long-term current use of insulin (HCC) Her last hemoglobin A1c was 10.5 in January 2019 compared to 8.8 in October 2018.  We had planned to start Victoza on her last visit however after prior authorization was approved she did not realize this medication was available for her has newly called her to pick it up.  She is currently taking Lantus 35 units every night and Januvia 100 mg daily.  She did not bring her meter today she says the battery ran out, but she says she is checking 3 times a day and to see most numbers in the 350s-400s.  She denies any low blood sugars.  She denies any hyperglycemic or hypoglycemic symptoms.  She did receive a sacroiliac joint steroid injection last week by her surgeon. A/P: Her diabetic control has again worsened after temporary improved control.  Over the last 7 years her hemoglobin A1c has fluctuated up and down without any consistent glycemic control.  Previously this has largely been due to difficulties obtaining her medications due to lack of insurance.  We will adjust her diabetic regimen as follows: -Continue Lantus 35 units nightly -Start NovoLog 8 units 3 times daily before meals -Start Victoza 0.6 mg daily for 7 days then increase to 1.2 mg daily -Start Jardiance 10 mg daily -Discontinue Januvia -Advised patient to check blood sugars 3 times a day before meals and bring meter to follow-up in 2 weeks for reassessment    Past Medical History:  Diagnosis Date  . 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 Arthritis   . Diabetes mellitus    oral  tx  . GERD (gastroesophageal reflux disease)   . History of post-sterilization tuboplasty 2000  . Plantar fasciitis    Right  . Shortness of breath    with exertion  . Sleep apnea 5 plus yrs   study -pt could not sleep test inconclusive.   . Tear of meniscus of left knee    x2  . Tear of meniscus of right knee    Review of Systems:   Review of Systems  Respiratory: Negative for shortness of breath.   Cardiovascular: Negative for chest pain.  Musculoskeletal: Positive for joint pain. Negative for falls.       Carpal tunnel syndrome  Neurological: Positive for tingling. Negative for dizziness and loss of consciousness.     Physical Exam:  Vitals:   09/25/17 1534  BP: 139/68  Pulse: 86  Temp: 98.1 F (36.7 C)  TempSrc: Oral  SpO2: 98%  Weight: 234 lb 3.2 oz (106.2 kg)  Height: 5\' 2"  (1.575 m)   Physical Exam  Constitutional: She is oriented to person, place, and time. She appears well-developed and well-nourished. No distress.  HENT:  Head: Normocephalic and atraumatic.  Cardiovascular: Normal rate and regular rhythm.  No murmur heard. Pulmonary/Chest: Effort normal. No respiratory distress. She has no wheezes. She has no rales.  Neurological: She is alert and oriented to person, place, and time.  Skin: No rash noted. She is not diaphoretic.    Assessment & Plan:   See Encounters Tab for problem based charting.  Patient  discussed with Dr. Evette Doffing

## 2017-09-25 NOTE — Patient Instructions (Addendum)
It was a please to see you again [name].  For your diabetes: - Continue Lantus 35 units every night - Start Novolog 8 units three times a day with meals - Start Jardiance (Empagliflozin) 10 mg daily - Start Victoza 0.6 mg daily for 1 week, increase to 1.2 mg daily afterwards - Stop Januvia  Please follow up with [name] in about 2 weeks with your meter to monitor your diabetes and adjust medications if needed.  Empagliflozin oral tablets What is this medicine? EMPAGLIFLOZIN (EM pa gli FLOE zin) helps to treat type 2 diabetes. It helps to control blood sugar. This drug may also reduce the risk of heart attack or stroke if you have type 2 diabetes and risk factors for heart disease. Treatment is combined with diet and exercise. This medicine may be used for other purposes; ask your health care provider or pharmacist if you have questions. COMMON BRAND NAME(S): JARDIANCE What should I tell my health care provider before I take this medicine? They need to know if you have any of these conditions: -dehydration -diabetic ketoacidosis -diet low in salt -eating less due to illness, surgery, dieting, or any other reason -having surgery -high cholesterol -high levels of potassium in the blood -history of pancreatitis or pancreas problems -history of yeast infection of the penis or vagina -if you often drink alcohol -infections in the bladder, kidneys, or urinary tract -kidney disease -liver disease -low blood pressure -on hemodialysis -problems urinating -type 1 diabetes -uncircumcised female -an unusual or allergic reaction to empagliflozin, other medicines, foods, dyes, or preservatives -pregnant or trying to get pregnant -breast-feeding How should I use this medicine? Take this medicine by mouth with a glass of water. Follow the directions on the prescription label. Take it in the morning, with or without food. Take your dose at the same time each day. Do not take more often than  directed. Do not stop taking except on your doctor's advice. Talk to your pediatrician regarding the use of this medicine in children. Special care may be needed. Overdosage: If you think you have taken too much of this medicine contact a poison control center or emergency room at once. NOTE: This medicine is only for you. Do not share this medicine with others. What if I miss a dose? If you miss a dose, take it as soon as you can. If it is almost time for your next dose, take only that dose. Do not take double or extra doses. What may interact with this medicine? Do not take this medicine with any of the following medications: -gatifloxacin This medicine may also interact with the following medications: -alcohol -certain medicines for blood pressure, heart disease -diuretics This list may not describe all possible interactions. Give your health care provider a list of all the medicines, herbs, non-prescription drugs, or dietary supplements you use. Also tell them if you smoke, drink alcohol, or use illegal drugs. Some items may interact with your medicine. What should I watch for while using this medicine? Visit your doctor or health care professional for regular checks on your progress. This medicine can cause a serious condition in which there is too much acid in the blood. If you develop nausea, vomiting, stomach pain, unusual tiredness, or breathing problems, stop taking this medicine and call your doctor right away. If possible, use a ketone dipstick to check for ketones in your urine. A test called the HbA1C (A1C) will be monitored. This is a simple blood test. It measures your blood  sugar control over the last 2 to 3 months. You will receive this test every 3 to 6 months. Learn how to check your blood sugar. Learn the symptoms of low and high blood sugar and how to manage them. Always carry a quick-source of sugar with you in case you have symptoms of low blood sugar. Examples include hard  sugar candy or glucose tablets. Make sure others know that you can choke if you eat or drink when you develop serious symptoms of low blood sugar, such as seizures or unconsciousness. They must get medical help at once. Tell your doctor or health care professional if you have high blood sugar. You might need to change the dose of your medicine. If you are sick or exercising more than usual, you might need to change the dose of your medicine. Do not skip meals. Ask your doctor or health care professional if you should avoid alcohol. Many nonprescription cough and cold products contain sugar or alcohol. These can affect blood sugar. Wear a medical ID bracelet or chain, and carry a card that describes your disease and details of your medicine and dosage times. What side effects may I notice from receiving this medicine? Side effects that you should report to your doctor or health care professional as soon as possible: -allergic reactions like skin rash, itching or hives, swelling of the face, lips, or tongue -breathing problems -dizziness -fast or irregular heartbeat -feeling faint or lightheaded, falls -muscle weakness -nausea, vomiting, unusual stomach upset or pain -signs and symptoms of low blood sugar such as feeling anxious, confusion, dizziness, increased hunger, unusually weak or tired, sweating, shakiness, cold, irritable, headache, blurred vision, fast heartbeat, loss of consciousness -signs and symptoms of a urinary tract infection, such as fever, chills, a burning feeling when urinating, blood in the urine, back pain -trouble passing urine or change in the amount of urine, including an urgent need to urinate more often, in larger amounts, or at night -penile discharge, itching, or pain in men -unusual tiredness -vaginal discharge, itching, or odor in women Side effects that usually do not require medical attention (report to your doctor or health care professional if they continue or are  bothersome): -joint pain -mild increase in urination -thirsty This list may not describe all possible side effects. Call your doctor for medical advice about side effects. You may report side effects to FDA at 1-800-FDA-1088. Where should I keep my medicine? Keep out of the reach of children. Store at room temperature between 20 and 25 degrees C (68 and 77 degrees F). Throw away any unused medicine after the expiration date. NOTE: This sheet is a summary. It may not cover all possible information. If you have questions about this medicine, talk to your doctor, pharmacist, or health care provider.  2018 Elsevier/Gold Standard (2015-08-04 11:46:10)

## 2017-09-25 NOTE — Telephone Encounter (Signed)
Has gotten steroids, blood sugars in 300-400s, has been using her husband's novolog, blood sugars some better now 129-160. Has not gotten victoza, wants to try Trulicity instead. Feels she needs something more than januvia and Lantus to control her blood sugars. Her husband has gotten good results with Trulicity.  Agrees to an appointment with a different doxtor if needed.  She'll call the front office for an appointment.

## 2017-09-26 NOTE — Assessment & Plan Note (Signed)
Her last hemoglobin A1c was 10.5 in January 2019 compared to 8.8 in October 2018.  We had planned to start Victoza on her last visit however after prior authorization was approved she did not realize this medication was available for her has newly called her to pick it up.  She is currently taking Lantus 35 units every night and Januvia 100 mg daily.  She did not bring her meter today she says the battery ran out, but she says she is checking 3 times a day and to see most numbers in the 350s-400s.  She denies any low blood sugars.  She denies any hyperglycemic or hypoglycemic symptoms.  She did receive a sacroiliac joint steroid injection last week by her surgeon. A/P: Her diabetic control has again worsened after temporary improved control.  Over the last 7 years her hemoglobin A1c has fluctuated up and down without any consistent glycemic control.  Previously this has largely been due to difficulties obtaining her medications due to lack of insurance.  We will adjust her diabetic regimen as follows: -Continue Lantus 35 units nightly -Start NovoLog 8 units 3 times daily before meals -Start Victoza 0.6 mg daily for 7 days then increase to 1.2 mg daily -Start Jardiance 10 mg daily -Discontinue Januvia -Advised patient to check blood sugars 3 times a day before meals and bring meter to follow-up in 2 weeks for reassessment

## 2017-09-27 ENCOUNTER — Other Ambulatory Visit: Payer: Self-pay | Admitting: *Deleted

## 2017-09-27 ENCOUNTER — Telehealth: Payer: Self-pay

## 2017-09-27 DIAGNOSIS — Z794 Long term (current) use of insulin: Secondary | ICD-10-CM

## 2017-09-27 DIAGNOSIS — E1165 Type 2 diabetes mellitus with hyperglycemia: Secondary | ICD-10-CM

## 2017-09-27 DIAGNOSIS — E118 Type 2 diabetes mellitus with unspecified complications: Secondary | ICD-10-CM

## 2017-09-27 DIAGNOSIS — IMO0002 Reserved for concepts with insufficient information to code with codable children: Secondary | ICD-10-CM

## 2017-09-27 DIAGNOSIS — E119 Type 2 diabetes mellitus without complications: Secondary | ICD-10-CM

## 2017-09-27 MED ORDER — INSULIN PEN NEEDLE 31G X 8 MM MISC: 1 refills | Status: DC

## 2017-09-27 MED ORDER — "INSULIN SYRINGE-NEEDLE U-100 31G X 15/64"" 0.3 ML MISC": 1 refills | Status: DC

## 2017-09-27 NOTE — Progress Notes (Signed)
Internal Medicine Clinic Attending  Case discussed with Dr. Patel at the time of the visit.  We reviewed the resident's history and exam and pertinent patient test results.  I agree with the assessment, diagnosis, and plan of care documented in the resident's note.  

## 2017-09-27 NOTE — Telephone Encounter (Signed)
empagliflozin (JARDIANCE) 10 MG TABS tablet, needs PA.

## 2017-09-30 ENCOUNTER — Telehealth: Payer: Self-pay | Admitting: *Deleted

## 2017-09-30 NOTE — Telephone Encounter (Signed)
PA information was sent to [facility] for Jardiance 10 mg tablets.  Approved 09/30/2017 thru 09/30/2018.  Sander Nephew, RN 09/30/2017 2:33 PM

## 2017-09-30 NOTE — Telephone Encounter (Signed)
Information was sent to [place] for PA for Jardiance.   Awaiting determination from Medicaid .   Sander Nephew, RN 09/30/2017 1:45 PM

## 2017-10-04 DIAGNOSIS — M7602 Gluteal tendinitis, left hip: Secondary | ICD-10-CM | POA: Insufficient documentation

## 2017-10-08 ENCOUNTER — Telehealth: Payer: Self-pay | Admitting: Internal Medicine

## 2017-10-08 NOTE — Telephone Encounter (Signed)
Attempted to rtc to pt, no answer, no vmail, rang til it automatically stopped

## 2017-10-08 NOTE — Telephone Encounter (Signed)
Patient is looking for the name of the medication that need pre approval they isn;t showing anything in the system

## 2017-10-09 ENCOUNTER — Other Ambulatory Visit: Payer: Self-pay | Admitting: Internal Medicine

## 2017-10-09 NOTE — Telephone Encounter (Signed)
Patient calling back, patient said she didn't get a callback yesterday. She needs her medicine it is almost a month

## 2017-10-09 NOTE — Telephone Encounter (Signed)
I attempted to call pt and did not get an answer, today I called the pharmacy, per the pharmacist they did not get a discontinue on the previous oral diab med and did not get the new diab med script, it was given verbally and the pharmacist will call the pt and clear their misunderstanding.

## 2017-10-10 ENCOUNTER — Ambulatory Visit: Payer: Medicaid Other

## 2017-10-10 ENCOUNTER — Encounter: Payer: Self-pay | Admitting: Internal Medicine

## 2017-10-22 ENCOUNTER — Ambulatory Visit: Payer: Medicaid Other

## 2017-10-25 ENCOUNTER — Telehealth: Payer: Self-pay | Admitting: Dietician

## 2017-10-25 NOTE — Telephone Encounter (Signed)
Ms Deihl had left a message on voicemail while I was out of the office asking for help with her blood sugars because the orthopedic doctor cancelled her surgery due to her high blood sugars.  When I returned her call today she said she "Figured out her medications and they are "working". She is taking  victoza-1.2   jardiance-10 and  lantus 35 units -sometimes a little less  (20 units) if bedtime CBG is  189 she had a low midsleep in the  40s. (Discussed that 189 is very close to target at bedtime and she should find a dose of lantus that she can take consistently and not have low blood sugars mid-sleep and awake with blood sugar 130mg /dl most days)  She reports she has not needed novolog lately. She also reports that her blood sugars are now  77-74, 118 or 107 every now and then . She has lost 8 pounds.   Reminded her she is due for an appointment and can schedule with another doctor.

## 2017-10-30 ENCOUNTER — Ambulatory Visit: Payer: Medicaid Other

## 2017-10-31 ENCOUNTER — Ambulatory Visit (INDEPENDENT_AMBULATORY_CARE_PROVIDER_SITE_OTHER): Payer: Medicaid Other | Admitting: Internal Medicine

## 2017-10-31 ENCOUNTER — Other Ambulatory Visit: Payer: Self-pay

## 2017-10-31 ENCOUNTER — Encounter: Payer: Self-pay | Admitting: Internal Medicine

## 2017-10-31 VITALS — BP 143/76 | HR 49 | Temp 98.3°F | Ht 62.0 in | Wt 224.3 lb

## 2017-10-31 DIAGNOSIS — R5383 Other fatigue: Secondary | ICD-10-CM

## 2017-10-31 DIAGNOSIS — E119 Type 2 diabetes mellitus without complications: Secondary | ICD-10-CM

## 2017-10-31 DIAGNOSIS — Z794 Long term (current) use of insulin: Secondary | ICD-10-CM | POA: Diagnosis not present

## 2017-10-31 LAB — POCT GLYCOSYLATED HEMOGLOBIN (HGB A1C): Hemoglobin A1C: 9.1

## 2017-10-31 LAB — GLUCOSE, CAPILLARY: Glucose-Capillary: 201 mg/dL — ABNORMAL HIGH (ref 65–99)

## 2017-10-31 NOTE — Assessment & Plan Note (Addendum)
Assessment: [name] was unable to start Jardiance and Victoza until 2 weeks ago. Since then, she reports significant improvement in her blood sugars, which are now ranging from 80-200. She reports feeling tired since starting these, but thinks it might be related to her glucose being in the 300's-400's. She has continued Lantus 35 units QHS but is not taking her mealtime insulin as she felt it made her sugars too low after addition of the above medications.  Chart review shows she was scheduled to have release of Dupuytrens contracture 3/27 but this was rescheduled due to uncontrolled diabetes with A1c 9.8% at that visit. She notes her surgeon would like [name] to repeat it today to see if she is making progress.   Plan: Repeat A1c today 9.1%. I've instructed her to continue Jardiance, Viktoza and Lantus. She was also instructed firmly to bring her glucometer to her next visit so we can get a better picture of her glycemic control.  -RTC 1 month

## 2017-10-31 NOTE — Patient Instructions (Signed)
It was good seeing you today. I'm glad your blood sugars are better since starting those 2 new medications 2 weeks ago. It important that you continue this regimen, and also bring your glucometer to your next visit so we can see what your blood sugars have been! This is how we know how we can adjust your medications to get better control so you can have your surgeries.   Please return to clinic in 1 month for follow-up. Please bring your glucometer.

## 2017-10-31 NOTE — Progress Notes (Signed)
   CC: follow-up of diabetes  HPI:  [name] is a 57 y.o. F with uncontrolled type-2 diabetes who presents today for follow-up of this.   For details regarding today's visit and the status of their chronic medical issues, please refer to the assessment and plan.  Past Medical History:  Diagnosis Date  . 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 Arthritis   . Diabetes mellitus    oral tx  . GERD (gastroesophageal reflux disease)   . History of post-sterilization tuboplasty 2000  . Plantar fasciitis    Right  . Shortness of breath    with exertion  . Sleep apnea 5 plus yrs   study -pt could not sleep test inconclusive.   . Tear of meniscus of left knee    x2  . Tear of meniscus of right knee    Review of Systems:   General: +Fatigue. +intentional weight loss. Denies fevers, chills HEENT: Denies acute changes in vision, headache Cardiac: Denies CP, DOE Abd: Denies abdominal pain, changes in bowels Extremities: Denies weakness or swelling  Physical Exam: General: Alert, in no acute distress. Pleasant and conversant Cardiac: RRR, no MGR appreciated Pulmonary: CTA BL with normal WOB on RA. Able to speak in complete sentences Abd: Soft, non-tender. +bs Extremities: Warm, perfused. No significant pedal edema.   Vitals:   10/31/17 1314  BP: (!) 143/76  Pulse: (!) 49  Temp: 98.3 F (36.8 C)  TempSrc: Oral  SpO2: 97%  Weight: 224 lb 4.8 oz (101.7 kg)  Height: 5\' 2"  (1.575 m)   Body mass index is 41.03 kg/m.  Assessment & Plan:   See Encounters Tab for problem based charting.  Patient discussed with Dr. Evette Doffing

## 2017-11-04 NOTE — Progress Notes (Signed)
Internal Medicine Clinic Attending  Case discussed with Dr. Molt at the time of the visit.  We reviewed the resident's history and exam and pertinent patient test results.  I agree with the assessment, diagnosis, and plan of care documented in the resident's note. 

## 2017-11-21 ENCOUNTER — Telehealth: Payer: Self-pay | Admitting: Internal Medicine

## 2017-11-28 ENCOUNTER — Ambulatory Visit: Payer: Medicaid Other

## 2017-11-29 ENCOUNTER — Telehealth: Payer: Self-pay | Admitting: Dietician

## 2017-11-29 ENCOUNTER — Ambulatory Visit: Payer: Medicaid Other

## 2017-11-29 NOTE — Telephone Encounter (Signed)
Calling to cancel appointment. reports it was for her A1C which got done recently at [facility] and was much improved. A1c 7.7% at [facility]

## 2017-12-03 ENCOUNTER — Other Ambulatory Visit: Payer: Self-pay | Admitting: Internal Medicine

## 2017-12-03 NOTE — Telephone Encounter (Signed)
Patient is requesting refill clonazepam, pls send to [facility] on [address]

## 2017-12-03 NOTE — Telephone Encounter (Signed)
Is not on pt medlist, attempted to call pt, no answer, no vmail at this time

## 2017-12-03 NOTE — Telephone Encounter (Signed)
Have attempted once again to call pt, no vmail at this time

## 2017-12-05 ENCOUNTER — Other Ambulatory Visit: Payer: Self-pay

## 2017-12-05 ENCOUNTER — Ambulatory Visit: Payer: Medicaid Other | Admitting: Internal Medicine

## 2017-12-05 ENCOUNTER — Telehealth: Payer: Self-pay | Admitting: Internal Medicine

## 2017-12-05 ENCOUNTER — Encounter: Payer: Self-pay | Admitting: Internal Medicine

## 2017-12-05 VITALS — BP 132/83 | HR 73 | Temp 98.2°F | Ht 62.0 in | Wt 222.4 lb

## 2017-12-05 DIAGNOSIS — M25552 Pain in left hip: Secondary | ICD-10-CM | POA: Diagnosis not present

## 2017-12-05 DIAGNOSIS — L255 Unspecified contact dermatitis due to plants, except food: Secondary | ICD-10-CM

## 2017-12-05 MED ORDER — CLOBETASOL PROPIONATE 0.05 % EX CREA: 1.0000 "application " | TOPICAL_CREAM | Freq: Two times a day (BID) | CUTANEOUS | 0 refills | Status: DC

## 2017-12-05 NOTE — Telephone Encounter (Signed)
Patient is physically in the clinic being seen today.  No need to send request for MRI.

## 2017-12-05 NOTE — Assessment & Plan Note (Signed)
She is following at [facility] orthopedic surgery for evaluation and management of her hip pain.  She was seen in late March and recommended to undergo MRI looking for subacute/chronic abductor tendon tear or gluteal tendinitis.  She has had trouble scheduling this in [place] due to being offered mostly late evening times and having difficulty with transportation.  She initially requested her order to be transferred to [place] to facilitate getting the MRI but there was some miscommunication based on note by [employer] note on 4/15. I have reviewed the chart recommendations and will replace the order for MRI left hip without contrast to be performed at [facility].  Ms. Banker understands she may need to obtain a burned copy of the PACS imaging if her orthopedist office does not have direct access to this on EPIC.

## 2017-12-05 NOTE — Assessment & Plan Note (Addendum)
She certainly appears to have a contact dermatitis pattern of rash and pruritus on exam today.  The mechanism seems somewhat doubtful from poison ivy as urushiol should not should generally not remain a potent antigen for prolonged periods of time on inorganic surfaces.  Regardless of the exact causative antigen, she would benefit from supportive treatment at this time. Moderate potency topical steroids (triamcinolone 0.01%) has not given much benefit. Plan: Start clobetasol 0.05% topical BID She can continue using Benadryl OTC if this is helpful

## 2017-12-05 NOTE — Progress Notes (Signed)
   CC: Rash  HPI:  [name] is a 57 y.o. female with PMHx detailed below presenting for pruritic rash on both arms for the past 2 days. She and her sister were moving furniture from once storage unit to another 3 days ago and then both developed similar rashes. She has a long history of atopic dermatitis and sensitivity to rash with many excoriations but these are new erythematous areas. Oral benadryl and topical triamcinolone 0.01% have not relieved the symptoms much.  See problem based assessment and plan below for additional details.  Dermatitis due to plants, including poison ivy, sumac, and oak She certainly appears to have a contact dermatitis pattern of rash and pruritus on exam today.  The mechanism seems somewhat doubtful from poison ivy as urushiol should not should generally not remain a potent antigen for prolonged periods of time on inorganic surfaces.  Regardless of the exact causative antigen, she would benefit from supportive treatment at this time. Moderate potency topical steroids (triamcinolone 0.01%) has not given much benefit. Plan: Start clobetasol 0.05% topical BID She can continue using Benadryl OTC if this is helpful  Left hip pain She is following at [facility] orthopedic surgery for evaluation and management of her hip pain.  She was seen in late March and recommended to undergo MRI looking for subacute/chronic abductor tendon tear or gluteal tendinitis.  She has had trouble scheduling this in [place] due to being offered mostly late evening times and having difficulty with transportation.  She initially requested her order to be transferred to [place] to facilitate getting the MRI but there was some miscommunication based on note by [employer] note on 4/15. I have reviewed the chart recommendations and will replace the order for MRI left hip without contrast to be performed at [facility].  [name] understands she  may need to obtain a burned copy of the PACS imaging if her orthopedist office does not have direct access to this on EPIC.   Past Medical History:  Diagnosis Date  . 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 Arthritis   . Diabetes mellitus    oral tx  . GERD (gastroesophageal reflux disease)   . History of post-sterilization tuboplasty 2000  . Plantar fasciitis    Right  . Shortness of breath    with exertion  . Sleep apnea 5 plus yrs   study -pt could not sleep test inconclusive.   . Tear of meniscus of left knee    x2  . Tear of meniscus of right knee     Review of Systems: Review of Systems  Constitutional: Negative for fever.  Skin: Positive for itching and rash.  Neurological: Negative for sensory change.     Physical Exam: Vitals:   12/05/17 0910  BP: 132/83  Pulse: 73  Temp: 98.2 F (36.8 C)  TempSrc: Oral  SpO2: 100%  Weight: 222 lb 6.4 oz (100.9 kg)  Height: 5\' 2"  (1.575 m)   GENERAL- alert, co-operative, NAD EXTREMITIES- No swelling or reduced ROM, symmetric No pedal edema. SKIN- Linear erythematous, slightly raised rashes on extensor > flexor surfaces of right arm and also rash in antecubital fossa of left arm, excoriations present on legs without erythematous rash PSYCH- Normal mood and affect, appropriate thought content and speech.   Assessment & Plan:   See encounters tab for problem based medical decision making.   Patient discussed with Dr. Dareen Piano

## 2017-12-05 NOTE — Patient Instructions (Addendum)
We will work on getting up an MRI set up here in town.  It is possible that we will need to transfer a hard copy of the imaging, although your orthopedic surgeon's office might be able to view it directly as it is.  Prescribed a much higher potency topical steroid to apply on the affected areas.  You can apply this up to twice daily.  This rash should be improving on its own within 1 to 2 weeks but the steroid can help with symptoms.

## 2017-12-06 NOTE — Progress Notes (Signed)
Internal Medicine Clinic Attending  Case discussed with Dr. Rice at the time of the visit.  We reviewed the resident's history and exam and pertinent patient test results.  I agree with the assessment, diagnosis, and plan of care documented in the resident's note.  

## 2017-12-25 ENCOUNTER — Ambulatory Visit (HOSPITAL_COMMUNITY): Payer: Medicaid Other

## 2017-12-27 ENCOUNTER — Ambulatory Visit (HOSPITAL_COMMUNITY): Admission: RE | Admit: 2017-12-27 | Payer: Medicaid Other | Source: Ambulatory Visit

## 2017-12-30 ENCOUNTER — Other Ambulatory Visit: Payer: Self-pay

## 2017-12-30 ENCOUNTER — Ambulatory Visit (INDEPENDENT_AMBULATORY_CARE_PROVIDER_SITE_OTHER): Payer: Medicaid Other | Admitting: Internal Medicine

## 2017-12-30 DIAGNOSIS — M720 Palmar fascial fibromatosis [Dupuytren]: Secondary | ICD-10-CM

## 2017-12-30 DIAGNOSIS — I10 Essential (primary) hypertension: Secondary | ICD-10-CM

## 2017-12-30 DIAGNOSIS — Z79891 Long term (current) use of opiate analgesic: Secondary | ICD-10-CM

## 2017-12-30 DIAGNOSIS — M797 Fibromyalgia: Secondary | ICD-10-CM

## 2017-12-30 DIAGNOSIS — M5116 Intervertebral disc disorders with radiculopathy, lumbar region: Secondary | ICD-10-CM | POA: Diagnosis not present

## 2017-12-30 DIAGNOSIS — E119 Type 2 diabetes mellitus without complications: Secondary | ICD-10-CM

## 2017-12-30 DIAGNOSIS — G2581 Restless legs syndrome: Secondary | ICD-10-CM

## 2017-12-30 DIAGNOSIS — G8929 Other chronic pain: Secondary | ICD-10-CM | POA: Diagnosis not present

## 2017-12-30 DIAGNOSIS — M1990 Unspecified osteoarthritis, unspecified site: Secondary | ICD-10-CM | POA: Diagnosis not present

## 2017-12-30 MED ORDER — OXYCODONE HCL 10 MG PO TABS: 10.0000 mg | ORAL_TABLET | Freq: Four times a day (QID) | ORAL | 0 refills | Status: DC | PRN

## 2017-12-30 NOTE — Progress Notes (Addendum)
CC: Pain  HPI:  [name] is a 57 y.o. female with past medical history as listed below including type 2 diabetes, chronic pain with L4-5 disc protrusion, hypertension, Dupuytren's contractures, osteoarthritis, restless leg syndrome, and fibromyalgia who presents for pain management.  Long-term current use of opiate analgesic Patient has chronic pain mostly secondary to lower back pain from L4-5 disc protrusion but also with MSK joint pain affecting shoulders and knees, Dupuytren's contractures, and fibromyalgia.  She is previously on a pain contract with [name] and subsequently referred to pain management.  She has been prescribed oxycodone 10 mg 4 times a day as needed by her pain specialist.  This was recently changed to oxymorphone 15 mg every 8 hours as needed per patient.  Patient states that this medication change causes oversedation without significant relief of her pain.  She says she did fairly well on her previous regimen which allowed her to tolerate her pain and complete cavities which she would otherwise be unable to perform.  She understands that she has chronic pain and will never be pain-free but would like to continue pain management to maintain a tolerable lifestyle.  She says her pain medicine allows her to ambulate further including, up and down stairs, as well as care for her husband who is wheelchair-bound.  She states that she no longer wants to return to her current pain specialist and would like to reestablish with [name] for her pain management.  Her prior pain regimen from months was: -Oxycodone/APAP 5-325 mg 2 tablets 3 times daily as needed #180/month -Tramadol 50 mg 2 tablets every 8 hours as needed #180/month  Her last tox assure screen on 02/29/2016 with [name] was as expected.  She did not have recent red flags with [name].  There was some concern with oversedation on her prior pain regimen.  Adjuvant treatment has included meloxicam, Flexeril, Lyrica, and consultation  with sports medicine and orthopedics.  She has received SI joint injections, lumbar radiofrequency ablation, and lumbar branch blocks with some relief.  She is to have an MRI of her left hip to further evaluate her gluteal tendinitis.  A/P: Patient with chronic low back pain with sciatica, MSK joint pain, and fibromyalgia on long-term use of opioid pain management.  We had a long discussion regarding benefits and risks of her pain management.  It does appear that she does benefit from pain control to allow her to complete some of her daily activities, most importantly involving the care of her husband.  She says her prior regimen of oxycodone 10 mg 4 times a day as needed allowed her to function on a reasonable level.  She says the recent change to oxymorphone 15 mg every 8 hours as needed is too much sedation for her.  I have requested recent records from her pain specialist which we will scan into our chart.  We have agreed to take over her pain management and have advised her to make her pain clinic aware of this.  I will refill a 30-day supply of her previous oxycodone 10 mg 4 times daily as needed #120/month. -Reestablish for pain management with [name] -Refill oxycodone 10 mg 4 times daily as needed #120/month -Check tox assure; pain specialist records requested and received -Monitor [place] controlled substance database -We will need routine follow-up every 3 months for pain control -Follow-up again with [name] in 1 month to establish pain contract, repeat toxassure  ADDENDUM 01/06/18: ToxAssure results with inconsistencies.  - Tramadol result is unexpected,  this was last prescribed 04/08/17 and filled 04/16/17 per the [place] Controlled Substance Database. - Patient reported prescriptions for Oxymorphone from her pain specialist. Records obtained show she was prescribed Oxycodone which is reflected in the  Controlled Substance Database, therefore the Oxycodone is expected and Oxymorphone is likely detected as an  expected metabolite of oxycodone. - Venlafaxine is currently prescribed and would be an expected result. - Patient will need to follow up in clinic for further clarification of her home medications, should bring her prescription bottles with her, and have repeat ToxAssure on follow up.    Past Medical History:  Diagnosis Date  . 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 Arthritis   . Diabetes mellitus    oral tx  . GERD (gastroesophageal reflux disease)   . History of post-sterilization tuboplasty 2000  . Plantar fasciitis    Right  . Shortness of breath    with exertion  . Sleep apnea 5 plus yrs   study -pt could not sleep test inconclusive.   . Tear of meniscus of left knee    x2  . Tear of meniscus of right knee    Review of Systems:   Review of Systems  Constitutional: Positive for weight loss. Negative for diaphoresis, fever and malaise/fatigue.  Respiratory: Negative for shortness of breath.   Cardiovascular: Negative for chest pain.  Musculoskeletal: Positive for back pain and joint pain. Negative for falls.       Fibromyalgia    Physical Exam:  Vitals:   12/30/17 1458  BP: 112/83  Pulse: 99  Temp: 98.3 F (36.8 C)  TempSrc: Oral  SpO2: 90%  Weight: 212 lb 8 oz (96.4 kg)  Height: 5\' 2"  (1.575 m)   Physical Exam  Constitutional: She is oriented to person, place, and time. She appears well-developed and well-nourished. No distress.  HENT:  Head: Normocephalic and atraumatic.  Cardiovascular: Normal rate and regular rhythm.  No murmur heard. Pulmonary/Chest: Effort normal. No respiratory distress. She has no wheezes. She has no rales.  Musculoskeletal:       Cervical back: She exhibits no tenderness.       Thoracic back: She exhibits no tenderness.       Lumbar back: She exhibits decreased range of motion, tenderness and pain.  Neurological: She is alert and oriented to person, place, and time.  Skin: Skin is  warm. She is not diaphoretic.     Assessment & Plan:   See Encounters Tab for problem based charting.  Patient seen with Dr. Lynnae January

## 2017-12-30 NOTE — Patient Instructions (Signed)
It was a pleasure to see you [name].  We are providing you with one month supply of your pain medicine: - Oxycodone 10 mg every 6 hours as needed #120 per month  Please follow up with [name] again in 1 month for reassessment and for your diabetes check up.

## 2017-12-31 ENCOUNTER — Other Ambulatory Visit: Payer: Self-pay | Admitting: Internal Medicine

## 2017-12-31 NOTE — Assessment & Plan Note (Addendum)
Patient has chronic pain mostly secondary to lower back pain from L4-5 disc protrusion but also with MSK joint pain affecting shoulders and knees, Dupuytren's contractures, and fibromyalgia.  She is previously on a pain contract with [name] and subsequently referred to pain management.  She has been prescribed oxycodone 10 mg 4 times a day as needed by her pain specialist.  This was recently changed to oxymorphone 15 mg every 8 hours as needed per patient.  Patient states that this medication change causes oversedation without significant relief of her pain.  She says she did fairly well on her previous regimen which allowed her to tolerate her pain and complete cavities which she would otherwise be unable to perform.  She understands that she has chronic pain and will never be pain-free but would like to continue pain management to maintain a tolerable lifestyle.  She says her pain medicine allows her to ambulate further including, up and down stairs, as well as care for her husband who is wheelchair-bound.  She states that she no longer wants to return to her current pain specialist and would like to reestablish with [name] for her pain management.  Her prior pain regimen from months was: -Oxycodone/APAP 5-325 mg 2 tablets 3 times daily as needed #180/month -Tramadol 50 mg 2 tablets every 8 hours as needed #180/month  Her last tox assure screen on 02/29/2016 with [name] was as expected.  She did not have recent red flags with [name].  There was some concern with oversedation on her prior pain regimen.  Adjuvant treatment has included meloxicam, Flexeril, Lyrica, and consultation with sports medicine and orthopedics.  She has received SI joint injections, lumbar radiofrequency ablation, and lumbar branch blocks with some relief.  She is to have an MRI of her left hip to further evaluate her gluteal tendinitis.  A/P: Patient with chronic low back pain with sciatica, MSK joint pain, and fibromyalgia on long-term use of  opioid pain management.  We had a long discussion regarding benefits and risks of her pain management.  It does appear that she does benefit from pain control to allow her to complete some of her daily activities, most importantly involving the care of her husband.  She says her prior regimen of oxycodone 10 mg 4 times a day as needed allowed her to function on a reasonable level.  She says the recent change to oxymorphone 15 mg every 8 hours as needed is too much sedation for her.  I have requested recent records from her pain specialist which we will scan into our chart.  We have agreed to take over her pain management and have advised her to make her pain clinic aware of this.  I will refill a 30-day supply of her previous oxycodone 10 mg 4 times daily as needed #120/month. -Reestablish for pain management with [name] -Refill oxycodone 10 mg 4 times daily as needed #120/month -Check tox assure; pain specialist records requested and received -Monitor [place] controlled substance database -We will need routine follow-up every 3 months for pain control -Follow-up again with [name] in 1 month to establish pain contract, repeat toxassure  ADDENDUM 01/06/18: ToxAssure results with inconsistencies.  - Tramadol result is unexpected, this was last prescribed 04/08/17 and filled 04/16/17 per the [place] Controlled Substance Database. - Patient reported prescriptions for Oxymorphone from her pain specialist. Records obtained show she was prescribed Oxycodone which is reflected in the [place] Controlled Substance Database, therefore the Oxycodone is expected and Oxymorphone is likely detected as  an expected metabolite of oxycodone. - Venlafaxine is currently prescribed and would be an expected result. - Patient will need to follow up in clinic for further clarification of her home medications, should bring her prescription bottles with her, and have repeat ToxAssure on follow up.

## 2017-12-31 NOTE — Progress Notes (Signed)
Internal Medicine Clinic Attending  Case discussed with Dr. Posey Pronto at the time of the visit.  We reviewed the resident's history and exam and pertinent patient test results.  I agree with the assessment, diagnosis, and plan of care documented in the resident's note. I saw and took a hx from pt. In addition to Dr Serita Grit note, pt had asked about also resuming the tramadol bc it helps with her fibromyalgia but doesn't help with her chronic L leg pain. Tramadol is not appropriate for fibromyalgia and I do not want to Rx two opioids. No tramadol for now.

## 2018-01-01 ENCOUNTER — Ambulatory Visit (HOSPITAL_COMMUNITY)
Admission: RE | Admit: 2018-01-01 | Discharge: 2018-01-01 | Disposition: A | Discharge status: Home / Self Care | Payer: Medicaid Other | Source: Ambulatory Visit | Attending: Internal Medicine | Admitting: Internal Medicine

## 2018-01-01 DIAGNOSIS — R600 Localized edema: Secondary | ICD-10-CM | POA: Insufficient documentation

## 2018-01-01 DIAGNOSIS — M1612 Unilateral primary osteoarthritis, left hip: Secondary | ICD-10-CM | POA: Diagnosis not present

## 2018-01-01 DIAGNOSIS — N838 Other noninflammatory disorders of ovary, fallopian tube and broad ligament: Secondary | ICD-10-CM | POA: Diagnosis not present

## 2018-01-01 DIAGNOSIS — M25552 Pain in left hip: Secondary | ICD-10-CM | POA: Diagnosis not present

## 2018-01-03 ENCOUNTER — Other Ambulatory Visit: Payer: Self-pay | Admitting: Internal Medicine

## 2018-01-03 DIAGNOSIS — E119 Type 2 diabetes mellitus without complications: Secondary | ICD-10-CM

## 2018-01-03 DIAGNOSIS — Z794 Long term (current) use of insulin: Secondary | ICD-10-CM

## 2018-01-04 LAB — TOXASSURE SELECT,+ANTIDEPR,UR

## 2018-01-06 ENCOUNTER — Telehealth: Payer: Self-pay | Admitting: Internal Medicine

## 2018-01-06 NOTE — Telephone Encounter (Signed)
Request sent to PCP today in separate encounter. Hubbard Hartshorn, RN, BSN

## 2018-01-06 NOTE — Progress Notes (Signed)
Attempted to call patient twice today 01/06/2018 but no answer and unable to leave voicemail.

## 2018-01-06 NOTE — Telephone Encounter (Signed)
Patient has been without her diabetes medicine, pharmacy needs approval

## 2018-01-07 ENCOUNTER — Other Ambulatory Visit: Payer: Self-pay | Admitting: Internal Medicine

## 2018-01-15 ENCOUNTER — Encounter: Payer: Medicaid Other | Admitting: Internal Medicine

## 2018-01-21 ENCOUNTER — Other Ambulatory Visit: Payer: Self-pay | Admitting: Internal Medicine

## 2018-01-21 DIAGNOSIS — G2581 Restless legs syndrome: Secondary | ICD-10-CM

## 2018-01-22 ENCOUNTER — Encounter: Payer: Medicaid Other | Admitting: Internal Medicine

## 2018-01-23 ENCOUNTER — Ambulatory Visit: Payer: Medicaid Other

## 2018-01-28 ENCOUNTER — Other Ambulatory Visit: Payer: Self-pay

## 2018-01-28 DIAGNOSIS — Z79891 Long term (current) use of opiate analgesic: Secondary | ICD-10-CM

## 2018-01-28 MED ORDER — OXYCODONE HCL 10 MG PO TABS: 10.0000 mg | ORAL_TABLET | Freq: Four times a day (QID) | ORAL | 0 refills | Status: DC | PRN

## 2018-01-28 NOTE — Telephone Encounter (Signed)
Attempted to call pt to remind her to keep 8/14 appt, no answer

## 2018-01-28 NOTE — Telephone Encounter (Signed)
Patient has appropriate refill history, but given UDS result, Dr. Posey Pronto wanted her to be seen back in the near future.  She has cancelled an appointment with her PCP Dr. Alfonse Spruce for tomorrow.   Will give refill X 1 month.  She needs to reschedule appointment with her PCP within this month, or she should start weaning the medication down.  She needs to follow up monthly in the near future until she is on a stable regimen or she will be weaned off of pain medication.   Please call patient and reschedule an appointment with PCP.

## 2018-01-28 NOTE — Telephone Encounter (Signed)
Oxycodone HCl 10 MG TABS   Refill request @ [facility] on [address].

## 2018-01-29 ENCOUNTER — Encounter: Payer: Medicaid Other | Admitting: Internal Medicine

## 2018-01-30 ENCOUNTER — Other Ambulatory Visit: Payer: Self-pay | Admitting: Internal Medicine

## 2018-02-04 ENCOUNTER — Other Ambulatory Visit: Payer: Self-pay | Admitting: Internal Medicine

## 2018-02-04 DIAGNOSIS — Z794 Long term (current) use of insulin: Secondary | ICD-10-CM

## 2018-02-04 DIAGNOSIS — E119 Type 2 diabetes mellitus without complications: Secondary | ICD-10-CM

## 2018-02-04 MED ORDER — INSULIN PEN NEEDLE 31G X 8 MM MISC: 1 refills | Status: DC

## 2018-02-04 NOTE — Telephone Encounter (Deleted)
These requests have been forwarded to Attending pool in separate encounter from today. Hubbard Hartshorn, RN, BSN

## 2018-02-04 NOTE — Telephone Encounter (Signed)
Done in another encounter

## 2018-02-04 NOTE — Telephone Encounter (Signed)
Needs refills Jardiance 10mg , meloxicam 7.5mg , test strip and pin and needles @ [facility] on [address], pt contact # [phone]

## 2018-02-13 ENCOUNTER — Other Ambulatory Visit: Payer: Self-pay

## 2018-02-13 ENCOUNTER — Other Ambulatory Visit: Payer: Self-pay | Admitting: Internal Medicine

## 2018-02-13 NOTE — Telephone Encounter (Signed)
cyclobenzaprine (FLEXERIL) 10 MG tablet   Refill request @  [facility] [name], [place] [phone] (Phone) [phone] (Fax)     Pt states the pharmacy is still waiting for the office to reply back.

## 2018-02-13 NOTE — Telephone Encounter (Signed)
This has been done.

## 2018-02-13 NOTE — Telephone Encounter (Signed)
Next appt scheduled  8/14 with PCP.

## 2018-02-26 ENCOUNTER — Ambulatory Visit: Payer: Medicaid Other | Admitting: Internal Medicine

## 2018-02-26 ENCOUNTER — Encounter: Payer: Self-pay | Admitting: Internal Medicine

## 2018-02-26 ENCOUNTER — Ambulatory Visit (HOSPITAL_COMMUNITY)
Admission: RE | Admit: 2018-02-26 | Discharge: 2018-02-26 | Disposition: A | Discharge status: Home / Self Care | Payer: Medicaid Other | Source: Ambulatory Visit | Attending: Family Medicine | Admitting: Family Medicine

## 2018-02-26 VITALS — BP 164/89 | HR 88 | Temp 98.4°F | Ht 62.0 in | Wt 208.8 lb

## 2018-02-26 DIAGNOSIS — Z872 Personal history of diseases of the skin and subcutaneous tissue: Secondary | ICD-10-CM

## 2018-02-26 DIAGNOSIS — R300 Dysuria: Secondary | ICD-10-CM

## 2018-02-26 DIAGNOSIS — E119 Type 2 diabetes mellitus without complications: Secondary | ICD-10-CM | POA: Diagnosis not present

## 2018-02-26 DIAGNOSIS — R002 Palpitations: Secondary | ICD-10-CM | POA: Insufficient documentation

## 2018-02-26 DIAGNOSIS — Z8349 Family history of other endocrine, nutritional and metabolic diseases: Secondary | ICD-10-CM

## 2018-02-26 DIAGNOSIS — M797 Fibromyalgia: Secondary | ICD-10-CM | POA: Diagnosis not present

## 2018-02-26 DIAGNOSIS — K219 Gastro-esophageal reflux disease without esophagitis: Secondary | ICD-10-CM | POA: Diagnosis not present

## 2018-02-26 DIAGNOSIS — G4730 Sleep apnea, unspecified: Secondary | ICD-10-CM | POA: Diagnosis not present

## 2018-02-26 DIAGNOSIS — M720 Palmar fascial fibromatosis [Dupuytren]: Secondary | ICD-10-CM | POA: Diagnosis not present

## 2018-02-26 DIAGNOSIS — Z0000 Encounter for general adult medical examination without abnormal findings: Secondary | ICD-10-CM

## 2018-02-26 DIAGNOSIS — G8929 Other chronic pain: Secondary | ICD-10-CM

## 2018-02-26 DIAGNOSIS — B354 Tinea corporis: Secondary | ICD-10-CM | POA: Insufficient documentation

## 2018-02-26 DIAGNOSIS — Z794 Long term (current) use of insulin: Secondary | ICD-10-CM

## 2018-02-26 DIAGNOSIS — M5126 Other intervertebral disc displacement, lumbar region: Secondary | ICD-10-CM

## 2018-02-26 DIAGNOSIS — R61 Generalized hyperhidrosis: Secondary | ICD-10-CM | POA: Diagnosis not present

## 2018-02-26 DIAGNOSIS — M5441 Lumbago with sciatica, right side: Secondary | ICD-10-CM

## 2018-02-26 DIAGNOSIS — Z79899 Other long term (current) drug therapy: Secondary | ICD-10-CM

## 2018-02-26 DIAGNOSIS — M5442 Lumbago with sciatica, left side: Secondary | ICD-10-CM

## 2018-02-26 DIAGNOSIS — Z79891 Long term (current) use of opiate analgesic: Secondary | ICD-10-CM | POA: Diagnosis not present

## 2018-02-26 LAB — POCT GLYCOSYLATED HEMOGLOBIN (HGB A1C): Hemoglobin A1C: 8.2 % — AB (ref 4.0–5.6)

## 2018-02-26 LAB — GLUCOSE, CAPILLARY: Glucose-Capillary: 133 mg/dL — ABNORMAL HIGH (ref 70–99)

## 2018-02-26 MED ORDER — CLOTRIMAZOLE 1 % EX CREA: 1.0000 "application " | TOPICAL_CREAM | Freq: Two times a day (BID) | CUTANEOUS | 0 refills | Status: DC

## 2018-02-26 MED ORDER — OXYCODONE HCL 10 MG PO TABS: 10.0000 mg | ORAL_TABLET | Freq: Four times a day (QID) | ORAL | 0 refills | Status: DC | PRN

## 2018-02-26 NOTE — Assessment & Plan Note (Signed)
Assessment: Mostly secondary to LBP from L4-L5 disc protrusion and MSK joint pain affecting shoulders and knees, Dupuytren's contractures, and fibromyalgia. She was previously on a pain contract with [name] and was then referred to pain management.  Previous pain medications include: - Oxycodone/APAP 5-325 mg 2 tablets 3 times daily as needed #180/month andTramadol 50 mg 2 tablets every 8 hours as needed #180/month (Reports that she did well on this previous regiment which allowed her to function normally and do ADL's).  - Oxycodone 10 mg QID PRN by pain specialist which was later switched to Oxymorphone 15 mg Q8h PRN (Patient states that Oxymorphone medication caused oversedation without significant relief of pain). - Oxycodone 10 mg 4 times daily as needed #120/month (Prescribed 12/31/17) 30 day supply - Patient reports that symptoms were controlled adequately on Oxycodone 10 mg 4 times until she "tore a ligament in her hip" 6 months ago. Since then she has had only intermittent relief with currently prescribed medications. Patient was seen by ortho on 01/14/18 for left hip pain, MRI was ordered and showed: (Minimal degenerative changes of the distal left gluteus medius and minimus tendons at the left greater trochanter. Otherwise, normal appearing left hip. Focal slight edema in the posterior paraspinal musculature to the left of midline at L5-S1). Steroid IA hip injection completed with no relief. She then underwent lumbar radiofrequency denervation of the medial branch nerves (01/15/18) with mild alleviation of symptoms. - She reports that she has continued to have pain in her lower back as well as left hip area. She states that she has a follow-up appointment with ortho on 03/04/18.   Plan: - Repeat toxassure. Will follow-up.  - Checked [place] Controlled Substances Reporting System today. Patient is filling her Oxycodone prescription once a month without suspicious activity.  - Prescribed Oxycodone 10 mg 4  times daily as needed #120/month (1 month supply). - Encouraged patient to follow-up with Ortho for further management of spondylosis.

## 2018-02-26 NOTE — Patient Instructions (Signed)
I prescribed you a 1 month supply of the Oxycodone 10 mg 4 times a day as needed for pain for 30 days. Please follow-up with your orthopedic surgeon.   I also prescribed you Clotrimazole cream for what seems to be a fungal infection under your armpits. Please apply this twice a day for 2 weeks.   I am also referring you to a cardiologist for a possible heart monitor to evaluate your "racing heart", sweating, and SOB.

## 2018-02-26 NOTE — Assessment & Plan Note (Signed)
Assessment: - Current regiment:  Novolog 8 units TID with meals  Lantus 35 units daily at bedtime  Empagliflozin 10 mg QD  Liraglutide: 1.2 mg QD - Repeat A1C today: 8.2 (Down from 9.1 3 months ago)  Plan: - Continue current insulin regiment - Ordered Retinal/Fundus photography - Foot exam performed by nursing staff - Screening for increased urinary albumin excretion: Ordered Urine Microalbumin/Creatinine ratio

## 2018-02-26 NOTE — Assessment & Plan Note (Signed)
Patient is not currently on a Statin. Will repeat Lipid panel today and discuss initiating a moderate intensity statin at next clinic visit.

## 2018-02-26 NOTE — Progress Notes (Signed)
CC: Routine follow up  HPI:  [name] is a 57 y.o. with a history of T2DM, GERD, sleep apnea, chronic pain who presents for routine follow-up.   #Diaphoresis, SOB, palpitations - Patient reports a 2 month history of diaphoresis which occurs intermittently throughout the day and night. She has also had SOB that occurs whenever she is walking or exerting herself. She endorses intermittent palpitations throughout this time which lasts seconds. She denies chest pain, cough, congestion, fevers, leg swelling. She had a recent trip to [place], [place] in May 2019. She denies sick contacts, insect bites including ticks. Patient does report a family history of thyroid disease. She denies family history of cardiovascular disease.  #Chronic Pain Mostly secondary to LBP from L4-L5 disc protrusion and MSK joint pain affecting shoulders and knees, Dupuytren's contractures, and fibromyalgia. She was previously on a pain contract with [name] and was then referred to pain management.  Previous pain medications include: - Oxycodone/APAP 5-325 mg 2 tablets 3 times daily as needed #180/month andTramadol 50 mg 2 tablets every 8 hours as needed #180/month (Reports that she did well on this previous regiment which allowed her to function normally and do ADL's).  - Oxycodone 10 mg QID PRN by pain specialist which was later switched to Oxymorphone 15 mg Q8h PRN (Patient states that the Oxymorphone caused oversedation without significant relief of pain). - Oxycodone 10 mg 4 times daily as needed #120/month (Prescribed 12/31/17) 30 day supply - Patient reports that symptoms were controlled adequately on Oxycodone 10 mg 4 times until she "tore a ligament in her hip" 6 months ago. Since then she has had only intermittent relief with currently prescribed medications. Patient was seen by ortho on 01/14/18 for left hip pain, MRI was ordered and showed: (Minimal degenerative changes of the distal left gluteus medius and  minimus tendons at the left greater trochanter. Otherwise, normal appearing left hip. Focal slight edema in the posterior paraspinal musculature to the left of midline at L5-S1). Steroid IA hip injection completed with no relief. She then underwent lumbar radiofrequency denervation of the medial branch nerves (01/15/18) with mild alleviation of symptoms. - She reports that she has continued to have pain in her lower back as well as left hip area. She states that she has a follow-up appointment with ortho on 03/04/18.   #Diabetes: - Current regiment:  Novolog 8 units TID with meals  Lantus 35 units daily at bedtime  Empagliflozin 10 mg QD  Liraglutide: 1.2 mg QD - Repeat A1C today: 8.2 (Down from 9.1 3 months ago)  #Tinea Corporis - Patient has a several month history of pruritic rash under both axilla bilaterally. She has tried Nystatin powder with no alleviation of symptoms. She reports that this is similar to a fungal infection that she had in the past where she was given topical and oral anti-fungals with relief of symptoms.    Past Medical History:  Diagnosis Date  . 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 Arthritis   . Diabetes mellitus    oral tx  . GERD (gastroesophageal reflux disease)   . History of post-sterilization tuboplasty 2000  . Plantar fasciitis    Right  . Shortness of breath    with exertion  . Sleep apnea 5 plus yrs   study -pt could not sleep test inconclusive.   . Tear of meniscus of left knee    x2  . Tear of meniscus of right  knee     Review of Systems: Review of Systems  Constitutional: Positive for diaphoresis. Negative for chills, fever and weight loss.  HENT: Negative for congestion, sinus pain and sore throat.   Respiratory: Positive for shortness of breath. Negative for cough and sputum production.   Cardiovascular: Positive for palpitations. Negative for chest pain and leg swelling.  Gastrointestinal:  Negative for abdominal pain, constipation, diarrhea, nausea and vomiting.  Genitourinary: Negative for dysuria, hematuria and urgency.  Musculoskeletal:       Left hip pain radiating to left thigh. Low back pain.   Skin: Positive for rash (Rash under axilla bilaterally).  Neurological: Negative for sensory change, weakness and headaches.  Endo/Heme/Allergies: Positive for polydipsia.  All other systems reviewed and are negative.   Physical Exam:  Vitals:   02/26/18 1514  BP: (!) 164/89  Pulse: 88  Temp: 98.4 F (36.9 C)  TempSrc: Oral  SpO2: 99%  Weight: 208 lb 12.8 oz (94.7 kg)  Height: 5\' 2"  (1.575 m)   Physical Exam  Constitutional: She appears well-developed and well-nourished.  HENT:  Head: Normocephalic and atraumatic.  Eyes: EOM are normal.  Neck: Normal range of motion. No thyromegaly present.  Cardiovascular: Normal rate and regular rhythm.  Pulmonary/Chest: Effort normal and breath sounds normal.  Abdominal: Soft. Bowel sounds are normal.  Musculoskeletal: She exhibits no edema (LE bilaterally) or tenderness (LE bilaterally).  Neurological: She is alert.  Skin:  Several large erythematous plaques with peripheral scaling under axilla bilaterally.  Psychiatric: She has a normal mood and affect. Her behavior is normal.  Nursing note and vitals reviewed.   Assessment & Plan:   See Encounters Tab for problem based charting.  Patient seen with Dr. Dareen Piano

## 2018-02-26 NOTE — Assessment & Plan Note (Signed)
Assessment: Patient reports a 2 month history of diaphoresis which occurs intermittently throughout the day and night. She has also had SOB that occurs whenever she is walking or exerting herself. She endorses intermittent palpitations throughout this time which lasts seconds. She denies chest pain, cough, congestion, fevers, leg swelling. She had a recent trip to [place], [place] in May 2019. She denies sick contacts, insect bites including ticks. Patient does report a family history of thyroid disease. She denies family history of cardiovascular disease. Patient's has irregular periods (LMP July 12th). She states that her periods have been decreasing over the last several years in duration and intervals.   Plan: - Differential diagnosis includes arrhythmia, thyroid disease, perimenopause.  - EKG today NSR - Ordered TSH - Referred to cardiology for potential cardiac holter monitoring.

## 2018-02-26 NOTE — Assessment & Plan Note (Signed)
Assessment: - Patient has a several month history of pruritic rash under both axilla bilaterally. She has tried Nystatin powder with no alleviation of symptoms. She reports that this is similar to a fungal infection that she had in the past where she was given topical and oral anti-fungals with relief of symptoms. Patient was found to have a several regions of erythema with peripheral scaling most consistent with a tina corporis.  Plan: - Prescribed Clotrimazole 1 % cream

## 2018-02-27 ENCOUNTER — Telehealth: Payer: Self-pay | Admitting: *Deleted

## 2018-02-27 ENCOUNTER — Other Ambulatory Visit: Payer: Self-pay

## 2018-02-27 LAB — URINALYSIS, ROUTINE W REFLEX MICROSCOPIC
Bilirubin, UA: NEGATIVE
Leukocytes, UA: NEGATIVE
Nitrite, UA: NEGATIVE
Protein, UA: NEGATIVE
RBC, UA: NEGATIVE
Specific Gravity, UA: >=1.03 — AB (ref 1.005–1.030)
Urobilinogen, Ur: 0.2 mg/dL (ref 0.2–1.0)
pH, UA: 5 (ref 5.0–7.5)

## 2018-02-27 LAB — LIPID PANEL
Chol/HDL Ratio: 4.2 ratio (ref 0.0–4.4)
Cholesterol, Total: 222 mg/dL — ABNORMAL HIGH (ref 100–199)
HDL: 53 mg/dL (ref >39)
LDL Calculated: 141 mg/dL — ABNORMAL HIGH (ref 0–99)
Triglycerides: 139 mg/dL (ref 0–149)
VLDL Cholesterol Cal: 28 mg/dL (ref 5–40)

## 2018-02-27 LAB — MICROALBUMIN / CREATININE URINE RATIO
Creatinine, Urine: 96.1 mg/dL
Microalb/Creat Ratio: <3.1 mg/g creat (ref 0.0–30.0)
Microalbumin, Urine: <3 ug/mL

## 2018-02-27 LAB — TSH: TSH: 1.43 u[IU]/mL (ref 0.450–4.500)

## 2018-02-27 MED ORDER — ATORVASTATIN CALCIUM 20 MG PO TABS: 20.0000 mg | ORAL_TABLET | Freq: Every day | ORAL | 3 refills | Status: DC

## 2018-02-27 NOTE — Telephone Encounter (Addendum)
Information was faxed to [facility] for [facility] for Oxycodone 10.   Awaiting determination.  Sander Nephew,, RN 02/27/2018 2:19 PM Call to [facility] for follow up of PA.  Approved 02/27/2018 thru 03/29/2018.  I [id number].  Sander Nephew, RN 03/04/2018 4:45 PM.

## 2018-02-27 NOTE — Telephone Encounter (Signed)
Received fax from [facility] requesting Rx for Atorvastatin sent today be resent by provider registered with Medicaid. Will route to Attending Pool. Hubbard Hartshorn, RN, BSN

## 2018-02-27 NOTE — Progress Notes (Signed)
Given her diagnosis of diabetes with a 7.0% Current 10-Year ASCVD risk will start moderate-intensity therapy (benefits outweigh risks). Ordered Atorvastatin 20 mg QD. Called patient to notify but tone busy. Will try later.

## 2018-02-27 NOTE — Progress Notes (Signed)
Internal Medicine Clinic Attending  I saw and evaluated the patient.  I personally confirmed the key portions of the history and exam documented by Dr. Prince and I reviewed pertinent patient test results.  The assessment, diagnosis, and plan were formulated together and I agree with the documentation in the resident's note.  

## 2018-02-27 NOTE — Telephone Encounter (Signed)
I sent it  

## 2018-02-27 NOTE — Telephone Encounter (Signed)
Requesting PA on Oxycodone HCl 10 MG TABS. ?

## 2018-02-28 NOTE — Telephone Encounter (Signed)
Thank you :)

## 2018-03-03 ENCOUNTER — Other Ambulatory Visit: Payer: Self-pay | Admitting: Student in an Organized Health Care Education/Training Program

## 2018-03-03 NOTE — Telephone Encounter (Signed)
Needs refills on meloxicam (MOBIC) 7.5 MG tablet @ [facility], pt contact# [phone]  Pt is also wanting a nurse/physician to call

## 2018-03-04 LAB — TOXASSURE SELECT,+ANTIDEPR,UR

## 2018-03-19 ENCOUNTER — Other Ambulatory Visit: Payer: Self-pay | Admitting: Student in an Organized Health Care Education/Training Program

## 2018-03-25 ENCOUNTER — Other Ambulatory Visit: Payer: Self-pay

## 2018-03-25 NOTE — Telephone Encounter (Signed)
Questions about med. Please call pt back.  

## 2018-03-25 NOTE — Telephone Encounter (Signed)
Called pt - stated Atorvastatin was ordered but was not told what it's for, so she has not been taken (ordered 8/15). Informed it's for cholesterol; informed her level is 222 per pt's request. Stated it has never been this high and will start taking the medication.  Also wants to know since almost time to refill Oxycodone, if she can go back on Tramadol for her fibromyalgia. Stated Oxycodone does not help with this only with the back pain. If need be, she's willing to go back on what she was on for pain before being changed to the Oxycodone 10 mg. Stated the combination works better.

## 2018-03-28 ENCOUNTER — Other Ambulatory Visit: Payer: Self-pay | Admitting: Internal Medicine

## 2018-03-28 DIAGNOSIS — Z79891 Long term (current) use of opiate analgesic: Secondary | ICD-10-CM

## 2018-03-28 MED ORDER — OXYCODONE HCL 10 MG PO TABS: 10.0000 mg | ORAL_TABLET | Freq: Four times a day (QID) | ORAL | 0 refills | Status: AC | PRN

## 2018-03-28 NOTE — Telephone Encounter (Signed)
I do not believe that Tramadol is appropriate for fibromyalgia and do not believe that prescribing two opioids is an appropriate treatment for her chronic pain. I spoke with her over the phone and discussed this with her. She stated that she "disagrees and have been taking this for years." I told her that she will need to make an appointment so that we can attempt to find a more appropriate pain regiment. Can someone please help her make an appointment with me?  Also, we will e-prescribe her Oxycodone prescription in the meantime. Thank you!

## 2018-03-28 NOTE — Telephone Encounter (Signed)
1 month supply (120 tabs) of oxycodone sent in to [facility].

## 2018-03-30 ENCOUNTER — Other Ambulatory Visit: Payer: Self-pay | Admitting: Internal Medicine

## 2018-03-30 DIAGNOSIS — Z794 Long term (current) use of insulin: Secondary | ICD-10-CM

## 2018-03-30 DIAGNOSIS — E119 Type 2 diabetes mellitus without complications: Secondary | ICD-10-CM

## 2018-03-31 ENCOUNTER — Telehealth: Payer: Self-pay | Admitting: *Deleted

## 2018-03-31 ENCOUNTER — Telehealth: Payer: Self-pay | Admitting: Internal Medicine

## 2018-03-31 NOTE — Telephone Encounter (Signed)
Per Epic, she has an appt on 9/25 in [facility].

## 2018-03-31 NOTE — Telephone Encounter (Signed)
Oxycodone sent to Pharmacy 03/28/2018. Victoza sent to PCP in separate refill request today. Hubbard Hartshorn, RN, BSN

## 2018-03-31 NOTE — Telephone Encounter (Addendum)
Information sent to [place] for PA for Oxycodone.  Awaiting PA approval.Jena Tegeler, RN 03/31/2018 12:16 PM. Call to [place] to inquire about PA status.  Approved 9//16/2019 thru 09/27/2018.  [place] [id number].  Sander Nephew, RN 04/01/2018 1:55 PM

## 2018-03-31 NOTE — Telephone Encounter (Signed)
Needs refill on Oxycodone HCl 10 MG TABS, liraglutide (VICTOZA) 18 MG/3ML SOPN(Expired) on [facility] [address]  ;pt contact# [phone]  Pt is requesting that her medicine can be on a 90 day supply or send a prescription in for a year, pls contact pt.

## 2018-04-01 ENCOUNTER — Other Ambulatory Visit: Payer: Self-pay | Admitting: Internal Medicine

## 2018-04-01 DIAGNOSIS — Z794 Long term (current) use of insulin: Secondary | ICD-10-CM

## 2018-04-01 DIAGNOSIS — E119 Type 2 diabetes mellitus without complications: Secondary | ICD-10-CM

## 2018-04-01 MED ORDER — LIRAGLUTIDE 18 MG/3ML ~~LOC~~ SOPN: PEN_INJECTOR | SUBCUTANEOUS | 2 refills | Status: DC

## 2018-04-03 ENCOUNTER — Ambulatory Visit: Payer: Medicaid Other | Admitting: Cardiovascular Disease

## 2018-04-07 ENCOUNTER — Other Ambulatory Visit: Payer: Self-pay | Admitting: Oncology

## 2018-04-09 ENCOUNTER — Ambulatory Visit: Payer: Medicaid Other

## 2018-04-10 ENCOUNTER — Ambulatory Visit: Payer: Medicaid Other

## 2018-04-14 ENCOUNTER — Encounter (INDEPENDENT_AMBULATORY_CARE_PROVIDER_SITE_OTHER): Payer: Self-pay

## 2018-04-14 ENCOUNTER — Ambulatory Visit: Payer: Medicaid Other | Admitting: Internal Medicine

## 2018-04-14 ENCOUNTER — Encounter: Payer: Self-pay | Admitting: Internal Medicine

## 2018-04-14 ENCOUNTER — Other Ambulatory Visit: Payer: Self-pay

## 2018-04-14 VITALS — BP 129/74 | HR 89 | Temp 98.4°F | Ht 60.0 in | Wt 203.6 lb

## 2018-04-14 DIAGNOSIS — Z0000 Encounter for general adult medical examination without abnormal findings: Secondary | ICD-10-CM

## 2018-04-14 DIAGNOSIS — E119 Type 2 diabetes mellitus without complications: Secondary | ICD-10-CM

## 2018-04-14 DIAGNOSIS — M720 Palmar fascial fibromatosis [Dupuytren]: Secondary | ICD-10-CM

## 2018-04-14 DIAGNOSIS — G8929 Other chronic pain: Secondary | ICD-10-CM

## 2018-04-14 DIAGNOSIS — M5442 Lumbago with sciatica, left side: Secondary | ICD-10-CM | POA: Diagnosis not present

## 2018-04-14 DIAGNOSIS — Z79891 Long term (current) use of opiate analgesic: Secondary | ICD-10-CM

## 2018-04-14 DIAGNOSIS — Z79899 Other long term (current) drug therapy: Secondary | ICD-10-CM

## 2018-04-14 DIAGNOSIS — M5126 Other intervertebral disc displacement, lumbar region: Secondary | ICD-10-CM | POA: Diagnosis not present

## 2018-04-14 DIAGNOSIS — Z23 Encounter for immunization: Secondary | ICD-10-CM

## 2018-04-14 DIAGNOSIS — M797 Fibromyalgia: Secondary | ICD-10-CM | POA: Diagnosis not present

## 2018-04-14 DIAGNOSIS — Z794 Long term (current) use of insulin: Secondary | ICD-10-CM

## 2018-04-14 LAB — GLUCOSE, CAPILLARY: Glucose-Capillary: 83 mg/dL (ref 70–99)

## 2018-04-14 MED ORDER — FLUOXETINE HCL 20 MG PO CAPS: 20.0000 mg | ORAL_CAPSULE | Freq: Every day | ORAL | 1 refills | Status: DC

## 2018-04-14 NOTE — Progress Notes (Signed)
   CC: Continued pain  HPI:  [name] is a 57 y.o. 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  [name] reports that she has three main areas of pain which include low back pain which is relieved by radiofrequency ablation every 6 months, left-sided sciatica pain which has been somewhat relieved with steroid IA hip injections, and fibromyalgia pain which she describes as "muscle spasms of the entire torso".  She is currently being prescribed Oxycodone 10 mg 4 times which she believes does alleviate her lower back and leg pain but does "nothing for the fibromyalgia pain". She states that the medication which has alleviated her pain the most is Tramadol. She also takes cyclobenzaprine at night which allows her to sleep. She reports that she has tried Cymbalta, Venlafaxine, and Lyrica without relief of pain.   She was being managed by the pain specialists but did not like how she was being treated stating "they were trying to get me addicted to opioids" and "they tried to increase the dosage without changing the medication which wasn't working."   Past Medical History:  Diagnosis Date  . 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 Arthritis   . Diabetes mellitus    oral tx  . GERD (gastroesophageal reflux disease)   . History of post-sterilization tuboplasty 2000  . Plantar fasciitis    Right  . Shortness of breath    with exertion  . Sleep apnea 5 plus yrs   study -pt could not sleep test inconclusive.   . Tear of meniscus of left knee    x2  . Tear of meniscus of right knee    Review of Systems:   Review of Systems  Musculoskeletal: Positive for back pain.       Left leg pain radiating to left thigh. Muscle spasms of the torso.  Neurological:  Negative for dizziness and weakness.  All other systems reviewed and are negative.   Physical Exam:  Vitals:   04/14/18 1326  BP: 129/74  Pulse: 89  Temp: 98.4 F (36.9 C)  TempSrc: Oral  SpO2: 99%  Weight: 203 lb 9.6 oz (92.4 kg)  Height: 5' (1.524 m)   Physical Exam  Constitutional: She is well-developed, well-nourished, and in no distress.  HENT:  Head: Normocephalic and atraumatic.  Eyes: EOM are normal.  Neck: Normal range of motion.  Cardiovascular: Normal rate, regular rhythm and normal heart sounds.  Pulmonary/Chest: Effort normal and breath sounds normal.  Musculoskeletal: Normal range of motion.  Neurological: She is alert.  Skin: Skin is warm and dry.  Psychiatric: Affect and judgment normal.  Nursing note and vitals reviewed.   Assessment & Plan:   See Encounters Tab for problem based charting.  Patient seen with Dr. Dareen Piano

## 2018-04-14 NOTE — Assessment & Plan Note (Signed)
Assessment: [name] has a long history of fibromyalgia pain which was previously alleviated by tramadol. She has tried Cymbalta, Venlafaxine, and Lyrica without relief of pain. Given that she is already on Oxycodone for her other type of pain, I will not be prescribed Tramadol (another opioid). I prescribed Fluoxetine which has been shown to be effective in patients who have failed first line treatment. I will have her follow-up in 1 month to reassess pain level at that time.   Plan: 1. Prescribed Fluoxetine 20 mg QD

## 2018-04-14 NOTE — Assessment & Plan Note (Signed)
Assessment/Plan: [name] received her flu shot today.

## 2018-04-16 ENCOUNTER — Other Ambulatory Visit: Payer: Self-pay | Admitting: Student in an Organized Health Care Education/Training Program

## 2018-04-16 DIAGNOSIS — G2581 Restless legs syndrome: Secondary | ICD-10-CM

## 2018-04-18 NOTE — Progress Notes (Signed)
Internal Medicine Clinic Attending  I saw and evaluated the patient.  I personally confirmed the key portions of the history and exam documented by Dr. Prince and I reviewed pertinent patient test results.  The assessment, diagnosis, and plan were formulated together and I agree with the documentation in the resident's note.  

## 2018-04-25 ENCOUNTER — Telehealth: Payer: Self-pay | Admitting: Internal Medicine

## 2018-04-25 NOTE — Telephone Encounter (Signed)
Flexeril #60 and Requip #90 sent 04/17/2018. Call placed to Pharmacy. States medicaid will not cover either med. Generic Requip is $300 out of pocket. Not requesting prior auth on either med per their computer system. Pharmacist is planning on calling patient within next 30 minutes to discuss. Will route to PA staff, Pharm D and PCP. Hubbard Hartshorn, RN, BSN

## 2018-04-25 NOTE — Telephone Encounter (Signed)
Called pharmacy, the scripts are on hold due to insurance refusing pay, the pharmacist will call pt and inform her.

## 2018-04-25 NOTE — Telephone Encounter (Signed)
Needs refill on cyclobenzaprine (FLEXERIL) 10 MG tablet, rOPINIRole (REQUIP) 0.5 MG tablet,  @ [facility] on [address]  ;pt contact [phone]

## 2018-04-28 ENCOUNTER — Telehealth: Payer: Self-pay | Admitting: *Deleted

## 2018-04-28 NOTE — Telephone Encounter (Signed)
Pt calls for pain med refill, please advise, I do not see it on her medlist at present

## 2018-05-01 ENCOUNTER — Encounter: Payer: Self-pay | Admitting: *Deleted

## 2018-05-01 MED ORDER — OXYCODONE HCL 10 MG PO TABS: 10.0000 mg | ORAL_TABLET | Freq: Four times a day (QID) | ORAL | 0 refills | Status: DC | PRN

## 2018-05-01 NOTE — Telephone Encounter (Signed)
Pls call patient regarding pain medicine; pt contact [phone]

## 2018-05-01 NOTE — Telephone Encounter (Signed)
Chronic pain and fibromyalgia. Last prescribed oxycodone 10mg  #120 on 9/17. Saw Dr. Alfonse Spruce on 9/30 but no refills prescribed at that visit. We will work on more reliable prescribing in a visit in the future. I will refill another month supply today. I reviewed database which was appropriate. Last urine tox had both oxycodone and tramadol which was addressed in last visit, will need repeat urine to ensure she does not continue using both.

## 2018-05-05 ENCOUNTER — Other Ambulatory Visit: Payer: Self-pay

## 2018-05-05 MED ORDER — MELOXICAM 7.5 MG PO TABS: 7.5000 mg | ORAL_TABLET | Freq: Every day | ORAL | 0 refills | Status: DC

## 2018-05-05 NOTE — Telephone Encounter (Signed)
meloxicam (MOBIC) 7.5 MG tablet   Refill request @  [facility] [phone] - [name], [address] AT [place] [phone] (Phone) [phone] (Fax)

## 2018-05-06 ENCOUNTER — Other Ambulatory Visit: Payer: Self-pay | Admitting: Internal Medicine

## 2018-05-06 NOTE — Telephone Encounter (Signed)
Pt medicine is at the pharmacy, the resident needs to be approve by medicaid.  Please contact  [facility] [phone] [name], [place] [phone] (Phone)   Pt contact [phone] (H)

## 2018-05-07 NOTE — Telephone Encounter (Signed)
Call to [employer]. 69 NPI is not registered with state and will need to get another doctor to prescribe for patient.  Call to [employer] patient picked up the [place] on 10/16 /2019 for $16.00.  Pharmacy to call for new prescription if patient needs a refill on.  Sander Nephew, RN 05/07/2018 4:41 PM.

## 2018-05-07 NOTE — Telephone Encounter (Signed)
Refill Request-patient states she has been waiting for 2 weeks for her medications to be refilled.  Pt states she is now having Muscle cramps and Muscle spasms b/c she has not had her prescriptions.  Patient states her medications are at the pharmacy but they can not release the medications due to the residents name being on them.  Please call patient.  rOPINIRole (REQUIP) 0.5 MG tablet  meloxicam (MOBIC) 7.5 MG tablet  cyclobenzaprine (FLEXERIL) 10 MG tablet  [id number] - [place], [place]

## 2018-05-08 ENCOUNTER — Other Ambulatory Visit: Payer: Self-pay

## 2018-05-08 DIAGNOSIS — M5442 Lumbago with sciatica, left side: Principal | ICD-10-CM

## 2018-05-08 DIAGNOSIS — M5441 Lumbago with sciatica, right side: Principal | ICD-10-CM

## 2018-05-08 DIAGNOSIS — G8929 Other chronic pain: Secondary | ICD-10-CM

## 2018-05-08 MED ORDER — CYCLOBENZAPRINE HCL 10 MG PO TABS: 10.0000 mg | ORAL_TABLET | Freq: Two times a day (BID) | ORAL | 1 refills | Status: DC | PRN

## 2018-05-08 NOTE — Telephone Encounter (Signed)
Looking at Dr. Jerlyn Ly last note it is mentioned under back pain that flexeril and topical NSAID's have been ineffective for her chronic back pain but these medications were on her AVS.  I would like Dr. Alfonse Spruce to comment on the appropriateness of continuing these medications if they were ineffective.  If she has reasons for [name] to continue with these medications then I am sure faculty will be happy to sign the prescriptions.  Will await Dr. Jerlyn Ly input.

## 2018-05-08 NOTE — Telephone Encounter (Signed)
I spoke with Dr. Alfonse Spruce and she notes there has been some documentation that the patient has responded to flexeril, but not Voltaren gel.  Therefore flexeril was prescribed and Voltaren gel was not.

## 2018-05-08 NOTE — Telephone Encounter (Signed)
Pt is calling back to speak with a nurse. 

## 2018-05-08 NOTE — Telephone Encounter (Signed)
[facility] states Dr Alfonse Spruce is not medicaid approved; given Dr Caroline More name who's attending today which was approved.  Called pt - no answer; left message meds have been approved.

## 2018-05-08 NOTE — Telephone Encounter (Signed)
Pt stated she only received 15 day supply of Flexeril which she could afford. I will send refill to the Attending since Dr Alfonse Spruce is not Medicaid approved per [facility] ( see prev encounter).

## 2018-05-09 ENCOUNTER — Other Ambulatory Visit: Payer: Self-pay

## 2018-05-09 ENCOUNTER — Telehealth: Payer: Self-pay | Admitting: Internal Medicine

## 2018-05-09 DIAGNOSIS — Z794 Long term (current) use of insulin: Secondary | ICD-10-CM

## 2018-05-09 DIAGNOSIS — E119 Type 2 diabetes mellitus without complications: Secondary | ICD-10-CM

## 2018-05-09 DIAGNOSIS — M797 Fibromyalgia: Secondary | ICD-10-CM

## 2018-05-09 NOTE — Telephone Encounter (Signed)
Insulin Glargine (LANTUS) 100 UNIT/ML Solostar Pen,  pregabalin (LYRICA) 100 MG capsule, requesting PA. Pt is using   [facility] [phone] - [name], [place] [phone] (Phone) [phone] (Fax)

## 2018-05-09 NOTE — Telephone Encounter (Signed)
rtc to pt, added to request from this am

## 2018-05-09 NOTE — Telephone Encounter (Signed)
Pt requesting a call back about a medication prescribed for fibromyalgia.

## 2018-05-12 MED ORDER — DICLOFENAC SODIUM 1 % TD GEL: 2.0000 g | Freq: Four times a day (QID) | TRANSDERMAL | 3 refills | Status: DC

## 2018-05-12 MED ORDER — FLUOXETINE HCL 20 MG PO CAPS: 20.0000 mg | ORAL_CAPSULE | Freq: Every day | ORAL | 3 refills | Status: DC

## 2018-05-12 MED ORDER — INSULIN GLARGINE 100 UNIT/ML SOLOSTAR PEN: 35.0000 [IU] | PEN_INJECTOR | Freq: Every day | SUBCUTANEOUS | 3 refills | Status: DC

## 2018-05-12 MED ORDER — PREGABALIN 100 MG PO CAPS: 100.0000 mg | ORAL_CAPSULE | Freq: Two times a day (BID) | ORAL | 3 refills | Status: DC

## 2018-05-12 NOTE — Telephone Encounter (Signed)
Needs PCP F/U Dec / JAn DM / pain F/U

## 2018-05-12 NOTE — Telephone Encounter (Signed)
Pt appt 07/02/2018 @ 2:15.

## 2018-05-14 ENCOUNTER — Telehealth: Payer: Self-pay | Admitting: *Deleted

## 2018-05-14 NOTE — Telephone Encounter (Signed)
Prescription for Diclofenac Gel 1% was approved for patient for 1 month 05/14/2018 thru 06/14/2018 as the preferred Voltaren Gel 1% is on back order.  Patient to receive Diclofenac Gel 1% 3 GM for 1 month only.   [place] [id number].  Message was sent to Drs Lynnae January and Alfonse Spruce about the need to have patient try Gabapentin first.   Sander Nephew, RN 05/14/2018  1:27 PM.

## 2018-05-15 ENCOUNTER — Telehealth: Payer: Self-pay | Admitting: Internal Medicine

## 2018-05-19 ENCOUNTER — Other Ambulatory Visit: Payer: Self-pay | Admitting: Internal Medicine

## 2018-05-29 ENCOUNTER — Other Ambulatory Visit: Payer: Self-pay

## 2018-05-29 ENCOUNTER — Telehealth: Payer: Self-pay | Admitting: Internal Medicine

## 2018-05-29 MED ORDER — OXYCODONE HCL 10 MG PO TABS: 10.0000 mg | ORAL_TABLET | Freq: Four times a day (QID) | ORAL | 0 refills | Status: DC | PRN

## 2018-05-29 NOTE — Telephone Encounter (Signed)
Oxycodone HCl 10 MG TABS   Refill request @  [facility] [phone] - [name], [place] [phone] (Phone) [phone] (Fax)

## 2018-05-30 NOTE — Telephone Encounter (Signed)
[name] has chronic pain and fibromyalgia. Last prescribed oxycodone 10 mg #120 on 10/14. I reviewed [place] controlled substance database which was appropriate. I will refill another month supply today. She has an appointment with me on 07/02/2018 where I will repeat urine drug screen (last one showed oxycodone and tramadol--I am not prescribing tramadol at this time).

## 2018-06-02 ENCOUNTER — Other Ambulatory Visit: Payer: Self-pay | Admitting: *Deleted

## 2018-06-02 MED ORDER — OXYCODONE HCL 10 MG PO TABS: 10.0000 mg | ORAL_TABLET | Freq: Four times a day (QID) | ORAL | 0 refills | Status: DC | PRN

## 2018-06-02 NOTE — Telephone Encounter (Signed)
Could you please resend the oxycodone script medicaid and medicare do not recognize dr Alfonse Spruce as a prescriber. I have informed doris s. And she is checking on it

## 2018-06-02 NOTE — Addendum Note (Signed)
Addended by: Hulan Fray on: 06/02/2018 06:58 PM   Modules accepted: Orders

## 2018-06-02 NOTE — Telephone Encounter (Signed)
Will refill based on Dr. Jerlyn Ly review of chart and [place] CSRS from 05/29/18.

## 2018-06-25 ENCOUNTER — Other Ambulatory Visit: Payer: Self-pay

## 2018-06-25 DIAGNOSIS — G8929 Other chronic pain: Secondary | ICD-10-CM

## 2018-06-25 DIAGNOSIS — M5442 Lumbago with sciatica, left side: Secondary | ICD-10-CM

## 2018-06-25 DIAGNOSIS — M5441 Lumbago with sciatica, right side: Secondary | ICD-10-CM

## 2018-06-25 DIAGNOSIS — Z794 Long term (current) use of insulin: Secondary | ICD-10-CM

## 2018-06-25 DIAGNOSIS — E119 Type 2 diabetes mellitus without complications: Secondary | ICD-10-CM

## 2018-06-25 MED ORDER — LIRAGLUTIDE 18 MG/3ML ~~LOC~~ SOPN: PEN_INJECTOR | SUBCUTANEOUS | 6 refills | Status: DC

## 2018-06-25 MED ORDER — CYCLOBENZAPRINE HCL 10 MG PO TABS: 10.0000 mg | ORAL_TABLET | Freq: Two times a day (BID) | ORAL | 1 refills | Status: DC | PRN

## 2018-06-25 NOTE — Telephone Encounter (Signed)
liraglutide (VICTOZA) 18 MG/3ML SOPN   cyclobenzaprine (FLEXERIL) 10 MG tablet   atorvastatin (LIPITOR) 20 MG tablet  JARDIANCE 10 MG TABS tablet, refill request @  [facility] [phone] - [name], [place] AT [place] [phone] (Phone) [phone] (Fax)

## 2018-06-27 ENCOUNTER — Telehealth: Payer: Self-pay | Admitting: Internal Medicine

## 2018-06-27 ENCOUNTER — Other Ambulatory Visit: Payer: Self-pay | Admitting: *Deleted

## 2018-06-27 DIAGNOSIS — M797 Fibromyalgia: Secondary | ICD-10-CM

## 2018-06-27 DIAGNOSIS — E119 Type 2 diabetes mellitus without complications: Secondary | ICD-10-CM

## 2018-06-27 DIAGNOSIS — Z794 Long term (current) use of insulin: Secondary | ICD-10-CM

## 2018-06-27 MED ORDER — ATORVASTATIN CALCIUM 20 MG PO TABS: 20.0000 mg | ORAL_TABLET | Freq: Every day | ORAL | 3 refills | Status: DC

## 2018-06-27 NOTE — Telephone Encounter (Signed)
Pt stated [facility] on [address] will not refill Victoza rx b/c Dr Alfonse Spruce is not registered w/Medicaid as a provider.  I called [facility] -verified above information. Given the Attending's name,DEA# for this am, Dr Beryle Beams. Rx was approved.  Called pt back - told her rx was be ready today for pick up. Then she asked about refill on Atorvastatin and Jardiance. Informed she should have refills.  I called [facility] back - stated Jardiance was last refilled 11/23-too early for a refill. And Atorvastatin was refilled by Dr Alfonse Spruce and was not Medicaid approved. And Medicaid usually will only pay for 30 days at a time.  Called pt back - informed of the above. She checked her Jardiance bottle, it was dated 11/23 - stated she's not sure what happened to her other pills. She will have to pay out of pocket; stated she will call [facility] for cost.  Message given to Josephina Shih to f/u on Dr Alfonse Spruce and [place].

## 2018-06-27 NOTE — Telephone Encounter (Signed)
Stated to disregard, a new rx had been sent in ( per Dr Heber Ruthven).

## 2018-06-27 NOTE — Telephone Encounter (Signed)
Pt called back to say pharmacy said needs a MD, resident isn't approved for Medicaid, pls contact pharmacy

## 2018-06-27 NOTE — Telephone Encounter (Signed)
Pls call pharmacy

## 2018-06-27 NOTE — Telephone Encounter (Signed)
Approved atorvastatin

## 2018-07-02 ENCOUNTER — Ambulatory Visit: Payer: Medicaid Other | Admitting: Internal Medicine

## 2018-07-02 ENCOUNTER — Other Ambulatory Visit: Payer: Self-pay | Admitting: Oncology

## 2018-07-02 ENCOUNTER — Other Ambulatory Visit: Payer: Self-pay

## 2018-07-02 ENCOUNTER — Encounter: Payer: Self-pay | Admitting: Internal Medicine

## 2018-07-02 VITALS — BP 136/86 | HR 98 | Temp 98.0°F | Ht 60.0 in | Wt 201.2 lb

## 2018-07-02 DIAGNOSIS — Z79891 Long term (current) use of opiate analgesic: Secondary | ICD-10-CM

## 2018-07-02 DIAGNOSIS — M720 Palmar fascial fibromatosis [Dupuytren]: Secondary | ICD-10-CM

## 2018-07-02 DIAGNOSIS — M797 Fibromyalgia: Secondary | ICD-10-CM

## 2018-07-02 DIAGNOSIS — Z0000 Encounter for general adult medical examination without abnormal findings: Secondary | ICD-10-CM

## 2018-07-02 DIAGNOSIS — E785 Hyperlipidemia, unspecified: Secondary | ICD-10-CM | POA: Diagnosis not present

## 2018-07-02 DIAGNOSIS — G8929 Other chronic pain: Secondary | ICD-10-CM

## 2018-07-02 DIAGNOSIS — E119 Type 2 diabetes mellitus without complications: Secondary | ICD-10-CM

## 2018-07-02 DIAGNOSIS — Z794 Long term (current) use of insulin: Secondary | ICD-10-CM | POA: Diagnosis not present

## 2018-07-02 DIAGNOSIS — M5442 Lumbago with sciatica, left side: Secondary | ICD-10-CM

## 2018-07-02 DIAGNOSIS — Z791 Long term (current) use of non-steroidal anti-inflammatories (NSAID): Secondary | ICD-10-CM

## 2018-07-02 DIAGNOSIS — M5441 Lumbago with sciatica, right side: Secondary | ICD-10-CM

## 2018-07-02 DIAGNOSIS — Z79899 Other long term (current) drug therapy: Secondary | ICD-10-CM

## 2018-07-02 DIAGNOSIS — M5126 Other intervertebral disc displacement, lumbar region: Secondary | ICD-10-CM

## 2018-07-02 LAB — POCT GLYCOSYLATED HEMOGLOBIN (HGB A1C): Hemoglobin A1C: 8.3 % — AB (ref 4.0–5.6)

## 2018-07-02 LAB — GLUCOSE, CAPILLARY: Glucose-Capillary: 193 mg/dL — ABNORMAL HIGH (ref 70–99)

## 2018-07-02 MED ORDER — DICLOFENAC SODIUM 1 % TD GEL: 2.0000 g | Freq: Four times a day (QID) | TRANSDERMAL | 3 refills | Status: DC

## 2018-07-02 MED ORDER — OXYCODONE HCL 10 MG PO TABS: 10.0000 mg | ORAL_TABLET | Freq: Four times a day (QID) | ORAL | 0 refills | Status: DC | PRN

## 2018-07-02 MED ORDER — MELOXICAM 7.5 MG PO TABS: 7.5000 mg | ORAL_TABLET | Freq: Every day | ORAL | 0 refills | Status: DC

## 2018-07-02 MED ORDER — INSULIN ASPART 100 UNIT/ML ~~LOC~~ SOLN: 8.0000 [IU] | Freq: Three times a day (TID) | SUBCUTANEOUS | 11 refills | Status: DC

## 2018-07-02 NOTE — Progress Notes (Signed)
   CC: Chronic pain  HPI:  [name] is a 57 y.o. female with chronic low back pain from L4-L5 protrusion, MSK joint pain affecting shoulders and knees, Dupuytren's contractures, and fibromyalgia on long-term use of opioid pain management who presents for follow-up of pain. Please see problem list for problem based charting.   Past Medical History:  Diagnosis Date  . 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 Arthritis   . Diabetes mellitus    oral tx  . GERD (gastroesophageal reflux disease)   . History of post-sterilization tuboplasty 2000  . Plantar fasciitis    Right  . Shortness of breath    with exertion  . Sleep apnea 5 plus yrs   study -pt could not sleep test inconclusive.   . Tear of meniscus of left knee    x2  . Tear of meniscus of right knee    Review of Systems:  Review of Systems  Constitutional: Negative for chills and fever.  Respiratory: Negative for cough and sputum production.   Cardiovascular: Negative for chest pain and palpitations.  Gastrointestinal: Negative for abdominal pain, diarrhea, nausea and vomiting.  Genitourinary: Negative for dysuria, frequency and urgency.  Musculoskeletal: Positive for back pain.       Bilateral hip and foot pain  Neurological: Negative for dizziness and headaches.  Psychiatric/Behavioral: Negative for depression, hallucinations and suicidal ideas.    Physical Exam:  Vitals:   07/02/18 1430  BP: 136/86  Pulse: 98  Temp: 98 F (36.7 C)  TempSrc: Oral  SpO2: 99%  Weight: 201 lb 3.2 oz (91.3 kg)  Height: 5' (1.524 m)   Physical Exam Constitutional:      Appearance: Normal appearance.  HENT:     Head: Normocephalic and atraumatic.     Nose: Nose normal. No congestion or rhinorrhea.  Pulmonary:     Effort: Pulmonary effort is normal. No respiratory distress.     Breath sounds: Normal breath sounds. No wheezing.  Abdominal:     General: Bowel sounds are  normal. There is no distension.     Palpations: Abdomen is soft.  Musculoskeletal:     Comments: Tenderness to palpation of the lumbar paraspinal muscle bilaterally. Tenderness to palpation of the left gluteus muscle.  Skin:    General: Skin is warm and dry.  Neurological:     Mental Status: She is alert.  Psychiatric:        Mood and Affect: Mood normal.        Behavior: Behavior normal.     Assessment & Plan:   See Encounters Tab for problem based charting.  Patient seen with Dr. Beryle Beams

## 2018-07-04 ENCOUNTER — Other Ambulatory Visit: Payer: Self-pay | Admitting: Internal Medicine

## 2018-07-04 ENCOUNTER — Telehealth: Payer: Self-pay | Admitting: *Deleted

## 2018-07-04 DIAGNOSIS — M5442 Lumbago with sciatica, left side: Principal | ICD-10-CM

## 2018-07-04 DIAGNOSIS — M5441 Lumbago with sciatica, right side: Principal | ICD-10-CM

## 2018-07-04 DIAGNOSIS — G8929 Other chronic pain: Secondary | ICD-10-CM

## 2018-07-04 NOTE — Telephone Encounter (Signed)
Pt called about flexeril, called pharm, dr Alfonse Spruce is being medicaid rejected, gave dr butcher's DEA

## 2018-07-04 NOTE — Telephone Encounter (Signed)
Ok with me Tamela Oddi - will you look into medicaid?

## 2018-07-04 NOTE — Telephone Encounter (Signed)
Dr. Lynnae January and Helen--Thank you for helping me with this. Doris--Also thank you for looking into this for me. I have had this problem for several months and would like to get it straightened out as soon as possible.

## 2018-07-04 NOTE — Telephone Encounter (Signed)
Information was sent to [facility] for PA for Diclofenac Gel 1%.  Approved 07/04/2018 thru 08/03/2018.  N[place]el was call at [phone] and notified of.  Sander Nephew, RN 07/04/2018 3:50 PM.

## 2018-07-05 NOTE — Assessment & Plan Note (Signed)
Assessment/Plan: 1. FIT test previously ordered. Encouraged her to pick up a test from the Lab and mail it in as soon as possible. 2. She is due for a mammogram. This has been ordered.

## 2018-07-05 NOTE — Assessment & Plan Note (Signed)
Assessment: Current pain regiment includes: Oxycodone 10 mg QID-- Back pain, decreases pain level from 10/10 to 5/10. Cyclobenzaprine 20 mg qhs-- Muscle spasms at night. Meloxicam 7.5 mg QD: Stiffness and joints. Ropinirole 0.5 mg QD-- Left restless leg. Voltaren gel-- MSK pain. Does help for approximately 15 minutes at a time. Adjuvant therapy: Ortho- Greater Trochanteric steroid injections and lumbar radiofrequency ablation (next: 07/22/18). Therapies that she has tried in the past which has not helped: gabapentin, Lyrica, venlafaxine, fluoxetine.  Plan: 1. Utox pending 2. Refilled oxycodone 10 mg QID PRN pain #120, meloxicam 7.5 mg QD, and Voltaren gel

## 2018-07-05 NOTE — Assessment & Plan Note (Signed)
Assessment: A1c today stable at 8.2 She states that she is using Lantus 35 units daily at bedtime, empagliflozin, liraglutide 1.2 mg QD. She has not been using her Novolog 8 units TID with meals because she ran out of this medication. She would like to get her blood glucose lower, stating that "the surgeons won't do surgery on my foot if I don't get my blood sugar down below 7.5 (referring to A1c)".   Plan: 1. Restarted Novolog 8 units TID with meals 2. Continue Lantus, Lantus 35 units daily at bedtime, empagliflozin, and liraglutide. 3. Retina photos ordered at last PCP visit. Staff unable to do it today. Will need to have done at next 3 month clinic visit.

## 2018-07-05 NOTE — Assessment & Plan Note (Signed)
Assessment: LDL 141 02/26/2018. Current 10-Year ASCVD Risk 7.3%. She was started on a moderate intensity statin in 03/2018--atorvastatin 20 mg QD and reports good compliance.  Plan: 1. Continue atorvastatin 20 mg QD

## 2018-07-06 LAB — TOXASSURE SELECT,+ANTIDEPR,UR

## 2018-07-07 NOTE — Progress Notes (Signed)
Medicine attending: I personally interviewed and briefly examined this patient on the day of the patient visit and reviewed pertinent clinical laboratory data  with resident physician Dr. Nita Sickle and we discussed a management plan.

## 2018-07-25 ENCOUNTER — Other Ambulatory Visit: Payer: Self-pay | Admitting: Internal Medicine

## 2018-07-25 NOTE — Telephone Encounter (Signed)
Pt called / informed PA has been approved per Regino Schultze; to call her pharmacy. Stated thank-you.

## 2018-07-25 NOTE — Telephone Encounter (Signed)
Needs refill on diclofenac sodium (VOLTAREN) 1 % GEL at [id number] - [name], [place] ;pt contact [phone]

## 2018-07-25 NOTE — Telephone Encounter (Signed)
Pt sated she needs a PA on Voltaren gel. Called [facility] - stated brand name not available. Gladys did PA last month - see telephone note 07/04/18. The pharmacy ran rx again but stated needs PA. Talked to Regino Schultze - stated she will f/u.

## 2018-07-25 NOTE — Telephone Encounter (Signed)
Call to [employer] information for PA for Diclofenac Gel 1%.  Was resubmitted .  Approved for 3 tubes of 100 GM 07/25/2018 thru 08/24/2018.  [place] [id number].  I [id number].   Sander Nephew, RN 07/25/2018 2:49 PM.

## 2018-08-04 ENCOUNTER — Other Ambulatory Visit: Payer: Self-pay | Admitting: Oncology

## 2018-08-04 ENCOUNTER — Other Ambulatory Visit: Payer: Self-pay | Admitting: Internal Medicine

## 2018-08-04 DIAGNOSIS — G8929 Other chronic pain: Secondary | ICD-10-CM

## 2018-08-04 DIAGNOSIS — G2581 Restless legs syndrome: Secondary | ICD-10-CM

## 2018-08-04 DIAGNOSIS — M5442 Lumbago with sciatica, left side: Principal | ICD-10-CM

## 2018-08-04 DIAGNOSIS — M5441 Lumbago with sciatica, right side: Principal | ICD-10-CM

## 2018-08-05 ENCOUNTER — Other Ambulatory Visit: Payer: Self-pay

## 2018-08-05 DIAGNOSIS — Z79891 Long term (current) use of opiate analgesic: Secondary | ICD-10-CM

## 2018-08-05 DIAGNOSIS — G2581 Restless legs syndrome: Secondary | ICD-10-CM

## 2018-08-05 DIAGNOSIS — E119 Type 2 diabetes mellitus without complications: Secondary | ICD-10-CM

## 2018-08-05 DIAGNOSIS — Z794 Long term (current) use of insulin: Secondary | ICD-10-CM

## 2018-08-05 MED ORDER — OXYCODONE HCL 10 MG PO TABS: 10.0000 mg | ORAL_TABLET | Freq: Four times a day (QID) | ORAL | 0 refills | Status: DC | PRN

## 2018-08-05 NOTE — Telephone Encounter (Signed)
rOPINIRole (REQUIP) 0.5 MG tablet,  liraglutide (VICTOZA) 18 MG/3ML SOPN, needs PA.

## 2018-08-05 NOTE — Telephone Encounter (Signed)
NEEDS PA ON VICTOZA

## 2018-08-05 NOTE — Telephone Encounter (Signed)
Oxycodone HCl 10 MG TABS,  Insulin Pen Needle (EASY TOUCH PEN NEEDLES) 31G X 8 MM MISC  Refill request @  [facility] [phone] - [name], [place] [phone] (Phone) [phone] (Fax)

## 2018-08-06 ENCOUNTER — Telehealth: Payer: Self-pay | Admitting: *Deleted

## 2018-08-06 ENCOUNTER — Other Ambulatory Visit: Payer: Self-pay | Admitting: *Deleted

## 2018-08-06 DIAGNOSIS — Z79891 Long term (current) use of opiate analgesic: Secondary | ICD-10-CM

## 2018-08-06 MED ORDER — OXYCODONE HCL 10 MG PO TABS: 10.0000 mg | ORAL_TABLET | Freq: Four times a day (QID) | ORAL | 0 refills | Status: DC | PRN

## 2018-08-06 NOTE — Telephone Encounter (Signed)
Pt states she needs   Oxycodone HCl 10 MG TABS, and liraglutide (VICTOZA) 18 MG/3ML SOPN to be filled by today. States she is completely out for five days. Please call pt back.

## 2018-08-06 NOTE — Telephone Encounter (Signed)
Information for PA for Victoza was sent to [employer].  Approved 1/22/20202 thru 08-06-2019. [place] [id number].  Sander Nephew, RN 08/06/2018 3:18 PM.

## 2018-08-06 NOTE — Telephone Encounter (Signed)
Ok. I have reviewed Dr. Jerlyn Ly last note and re-ordered #120 tabs. I hope we can get this medicaid issue fixed.

## 2018-08-06 NOTE — Telephone Encounter (Signed)
Information was sent to [employer] for PA for Victoza.  Conf # [id number].  Awaiting determination.  Sander Nephew, RN 08/06/2018 11:51 AM.

## 2018-08-06 NOTE — Telephone Encounter (Signed)
Pt calls and is very upset, states she wants a new dr and prefers dr Angelia Mould, states she spoke w/ someone before her last assigned doctor but now she wants him as sonnas possible. She is tired of her scripts not going through and also has needed her primary to order a MRI of R shoulder per her ortho md but needs to be done somewhere besides [place] since her ortho md is at [place] she would be going to [place] for the MRI her ortho told her to get pcp to order and ask her at her appt yesterday why she hasnt had the MRI. Please advise soon

## 2018-08-06 NOTE — Telephone Encounter (Signed)
Pharmacy calls and states that dr Alfonse Spruce is not approved to write scripts for medicaid pts, have given info to doris s., will the attending please resend the oxy script?

## 2018-08-06 NOTE — Telephone Encounter (Signed)
Needs PA rOPINIROLE

## 2018-08-07 ENCOUNTER — Telehealth: Payer: Self-pay | Admitting: Dietician

## 2018-08-07 NOTE — Telephone Encounter (Signed)
[name], we could potentially order the MRI, however I do not see anything in the ortho note that they wanted [name] to order the test (looks like they ordered it), I am somewhat concerned that they may want to be able to review the images which may not be available if she gets the MRI here (or at least not as easily), was there any messages from Ortho sent to Dr Alfonse Spruce?

## 2018-08-07 NOTE — Telephone Encounter (Signed)
Answered her questions about medicines.

## 2018-08-08 NOTE — Telephone Encounter (Signed)
Call to [facility], [place]. Ropinirole is covered no PA needed.  Needed a change in doctor as prescribing doctor is not on the list.

## 2018-08-08 NOTE — Telephone Encounter (Signed)
Have tried to call pt, will continue to get info

## 2018-09-02 ENCOUNTER — Other Ambulatory Visit: Payer: Self-pay

## 2018-09-02 DIAGNOSIS — Z79891 Long term (current) use of opiate analgesic: Secondary | ICD-10-CM

## 2018-09-02 NOTE — Telephone Encounter (Signed)
Oxycodone HCl 10 MG TABS   Refill request @  [facility] [phone] - [name], [place] [phone] (Phone) [phone] (Fax)

## 2018-09-03 MED ORDER — OXYCODONE HCL 10 MG PO TABS: 10.0000 mg | ORAL_TABLET | Freq: Four times a day (QID) | ORAL | 0 refills | Status: DC | PRN

## 2018-09-04 ENCOUNTER — Other Ambulatory Visit: Payer: Self-pay | Admitting: Internal Medicine

## 2018-09-04 ENCOUNTER — Telehealth: Payer: Self-pay | Admitting: *Deleted

## 2018-09-04 DIAGNOSIS — M5442 Lumbago with sciatica, left side: Principal | ICD-10-CM

## 2018-09-04 DIAGNOSIS — G8929 Other chronic pain: Secondary | ICD-10-CM

## 2018-09-04 DIAGNOSIS — M5441 Lumbago with sciatica, right side: Principal | ICD-10-CM

## 2018-09-04 NOTE — Telephone Encounter (Addendum)
Received call from pt  1. checking on the oxycodone refill status-which was sent to pharmacy on 2/19-pt informed 2. states her insurance will not pay for the brand Voltaren Gel and asked that it be changed to the generic, spoke with pharmacist brand Voltaren no longer being manufactured, but the generic will still need a prior auth-request given to PA staff.   3. Received electronic refill request from pt's pharmacy for meloxicam 7.5mg  take one daily-pt requests to go back on the 15mg  tabs she was on in the past because it helps her pain better  Will send to pcp for review, please advise.[name]2/20/202012:23 PM

## 2018-09-04 NOTE — Telephone Encounter (Signed)
Call to [employer] for PA for Diclofenac Gel1%.  Approved 09/04/2018 thru 08/30/2019.  [place] [id number].  [facility] was called at [phone] and [phone] Sander Nephew, RN 09/04/18 4:08 PM.

## 2018-09-08 ENCOUNTER — Other Ambulatory Visit: Payer: Self-pay | Admitting: Internal Medicine

## 2018-09-08 DIAGNOSIS — Z794 Long term (current) use of insulin: Secondary | ICD-10-CM

## 2018-09-08 DIAGNOSIS — E119 Type 2 diabetes mellitus without complications: Secondary | ICD-10-CM

## 2018-09-08 MED ORDER — GLUCOSE BLOOD VI STRP: ORAL_STRIP | 1 refills | Status: DC

## 2018-09-08 MED ORDER — ACCU-CHEK FASTCLIX LANCETS MISC: 1 refills | Status: DC

## 2018-09-08 MED ORDER — ACCU-CHEK AVIVA PLUS W/DEVICE KIT: 1.0000 | PACK | Freq: Three times a day (TID) | 0 refills | Status: DC

## 2018-09-08 NOTE — Telephone Encounter (Signed)
Pt meter stop working, pt would like to know if she can get a new one [place], [place] [phone] - [name], [place]  pt contact [phone]

## 2018-09-10 ENCOUNTER — Other Ambulatory Visit: Payer: Self-pay

## 2018-09-10 DIAGNOSIS — G8929 Other chronic pain: Secondary | ICD-10-CM

## 2018-09-10 DIAGNOSIS — M5442 Lumbago with sciatica, left side: Principal | ICD-10-CM

## 2018-09-10 DIAGNOSIS — M5441 Lumbago with sciatica, right side: Principal | ICD-10-CM

## 2018-09-10 MED ORDER — CYCLOBENZAPRINE HCL 10 MG PO TABS: 10.0000 mg | ORAL_TABLET | Freq: Two times a day (BID) | ORAL | 1 refills | Status: DC | PRN

## 2018-09-10 NOTE — Telephone Encounter (Signed)
cyclobenzaprine (FLEXERIL) 10 MG tablet, REFILL REQUEST @  [facility] [phone] - [name], [place] AT [place] [phone] (Phone) [phone] (Fax

## 2018-09-22 ENCOUNTER — Other Ambulatory Visit: Payer: Self-pay | Admitting: Dietician

## 2018-09-22 DIAGNOSIS — Z794 Long term (current) use of insulin: Secondary | ICD-10-CM

## 2018-09-22 DIAGNOSIS — E119 Type 2 diabetes mellitus without complications: Secondary | ICD-10-CM

## 2018-09-22 NOTE — Telephone Encounter (Signed)
Request diabetes supplies and insulin aspart pen instead of vials

## 2018-09-23 MED ORDER — ACCU-CHEK GUIDE ME W/DEVICE KIT: 1.0000 | PACK | Freq: Three times a day (TID) | 0 refills | Status: DC

## 2018-09-23 MED ORDER — INSULIN ASPART 100 UNIT/ML FLEXPEN: PEN_INJECTOR | SUBCUTANEOUS | 5 refills | Status: DC

## 2018-09-23 MED ORDER — PEN NEEDLES 32G X 4 MM MISC: 1.0000 | Freq: Every day | 8 refills | Status: DC

## 2018-09-23 MED ORDER — GLUCOSE BLOOD VI STRP: ORAL_STRIP | 12 refills | Status: DC

## 2018-09-30 ENCOUNTER — Other Ambulatory Visit: Payer: Self-pay

## 2018-09-30 DIAGNOSIS — Z79891 Long term (current) use of opiate analgesic: Secondary | ICD-10-CM

## 2018-09-30 MED ORDER — OXYCODONE HCL 10 MG PO TABS: 10.0000 mg | ORAL_TABLET | Freq: Four times a day (QID) | ORAL | 0 refills | Status: DC | PRN

## 2018-09-30 NOTE — Telephone Encounter (Signed)
Oxycodone HCl 10 MG TABS, refill request @  [facility] [phone] - [name], [place] AT [place] [phone] (Phone) [phone] (Fax)

## 2018-10-03 ENCOUNTER — Other Ambulatory Visit: Payer: Self-pay | Admitting: Internal Medicine

## 2018-10-03 DIAGNOSIS — E119 Type 2 diabetes mellitus without complications: Secondary | ICD-10-CM

## 2018-10-03 DIAGNOSIS — Z794 Long term (current) use of insulin: Secondary | ICD-10-CM

## 2018-10-03 NOTE — Telephone Encounter (Signed)
Needs refill on Insulin Glargine (LANTUS) 100 UNIT/ML Solostar Pen [facility] [phone] - [place], [place] - [place] ;pt contact [phone] Per pharmacy is getting a code error

## 2018-10-04 MED ORDER — INSULIN GLARGINE 100 UNIT/ML SOLOSTAR PEN: 35.0000 [IU] | PEN_INJECTOR | Freq: Every day | SUBCUTANEOUS | 3 refills | Status: DC

## 2018-10-16 ENCOUNTER — Other Ambulatory Visit: Payer: Self-pay | Admitting: Internal Medicine

## 2018-10-16 DIAGNOSIS — M5442 Lumbago with sciatica, left side: Principal | ICD-10-CM

## 2018-10-16 DIAGNOSIS — R21 Rash and other nonspecific skin eruption: Secondary | ICD-10-CM

## 2018-10-16 DIAGNOSIS — M5441 Lumbago with sciatica, right side: Principal | ICD-10-CM

## 2018-10-16 DIAGNOSIS — G8929 Other chronic pain: Secondary | ICD-10-CM

## 2018-10-16 MED ORDER — DICLOFENAC SODIUM 1 % TD GEL: 2.0000 g | Freq: Four times a day (QID) | TRANSDERMAL | 3 refills | Status: DC

## 2018-10-16 MED ORDER — CETIRIZINE HCL 10 MG PO TABS: 10.0000 mg | ORAL_TABLET | Freq: Every day | ORAL | 3 refills | Status: DC

## 2018-10-16 MED ORDER — DIPHENHYDRAMINE HCL 25 MG PO CAPS: 25.0000 mg | ORAL_CAPSULE | Freq: Four times a day (QID) | ORAL | 2 refills | Status: DC | PRN

## 2018-10-16 NOTE — Telephone Encounter (Signed)
Refill Request   diclofenac sodium (VOLTAREN) 1 % GEL  cetirizine (ZYRTEC) 10 MG tablet  diphenhydrAMINE (BENADRYL) 25 mg capsule   [id number] - [place],  - [place]

## 2018-10-17 NOTE — Telephone Encounter (Signed)
Cetirizine 10 mg  Ordered "fax" - I called rx  to [facility].

## 2018-10-27 ENCOUNTER — Telehealth: Payer: Self-pay | Admitting: *Deleted

## 2018-10-27 ENCOUNTER — Other Ambulatory Visit: Payer: Self-pay

## 2018-10-27 ENCOUNTER — Ambulatory Visit: Payer: Medicaid Other

## 2018-10-27 NOTE — Telephone Encounter (Signed)
Patient called in c/o  left eye puffiness and left cheek "tingly" when she touches it since yesterday. Also, c/o left hand "falling asleep" x 7 days. Had 4 bites from "stink bug" 6 days ago. Now with 8 new bites on right leg x 2 days. Unsure what caused these new bites. Patient has not been going outside. Took benadryl thinking that eye and cheek sx were 2/2 allergic reaction but had no relief. Patient prefers not to come to clinic but would appreciate a telehealth visit. Hubbard Hartshorn, RN, BSN

## 2018-10-27 NOTE — Telephone Encounter (Signed)
I would prefer an in person visit this seems like it will be difficult to diagnose and treat over the phone.

## 2018-10-27 NOTE — Telephone Encounter (Addendum)
Pt took some benadryl-swelling/puffiness has slightly improved, but still concerned about "tingling" left hand-appt given for tomorrow at 0915.[name]4/13/20203:41 PM

## 2018-10-27 NOTE — Telephone Encounter (Signed)
[facility] visit for telehealth scheduled for today at 1:45. Hubbard Hartshorn, RN, BSN

## 2018-10-27 NOTE — Telephone Encounter (Signed)
OK please arrange tele-health encounter

## 2018-10-28 ENCOUNTER — Ambulatory Visit: Payer: Medicaid Other

## 2018-10-28 ENCOUNTER — Telehealth: Payer: Self-pay | Admitting: Internal Medicine

## 2018-10-28 NOTE — Telephone Encounter (Signed)
Pt has not transportation until Thursday.  (Pt is staying with her daughter) Pt is not able to attend her appointment today .  Notified [facility] Nurse Leonia Corona.  Per Regino Schultze Please call patient back.

## 2018-10-28 NOTE — Telephone Encounter (Signed)
RTC to patient about appointment rescheduling.  Patient stated that her swelling has gone down past taking Benadryl yesterday,  When asked about possible bug bites or bed bugs.  Stated that she did have a visit from her sister and bedbugs were seen after the visit.  And a few were seen a few days ago and had also had some bite marks on her arm recently.  Does have some tingling in left arm now that was once in the right arm.  Said last time it was due to her Neuropathy.  Does not feel that she needs to come in. Information was given to Dr. Tarri Abernethy who wants patient to try and come in if symptoms  worsen or has breathing problems. Had some concerns about her husband.  Message was given to Triage to call patient's husband .  Sander Nephew, RN 10/28/2018 10:08 AM

## 2018-10-30 ENCOUNTER — Other Ambulatory Visit: Payer: Self-pay | Admitting: Internal Medicine

## 2018-10-30 ENCOUNTER — Other Ambulatory Visit: Payer: Self-pay

## 2018-10-30 DIAGNOSIS — G8929 Other chronic pain: Secondary | ICD-10-CM

## 2018-10-30 DIAGNOSIS — M5442 Lumbago with sciatica, left side: Principal | ICD-10-CM

## 2018-10-30 DIAGNOSIS — M5441 Lumbago with sciatica, right side: Principal | ICD-10-CM

## 2018-10-30 DIAGNOSIS — G2581 Restless legs syndrome: Secondary | ICD-10-CM

## 2018-10-30 MED ORDER — CYCLOBENZAPRINE HCL 10 MG PO TABS: 10.0000 mg | ORAL_TABLET | Freq: Two times a day (BID) | ORAL | 1 refills | Status: DC | PRN

## 2018-10-30 NOTE — Telephone Encounter (Signed)
cyclobenzaprine (FLEXERIL) 10 MG tablet, refill request @  [facility] [phone] [name], [place] [phone] (Phone) [phone] (Fax)

## 2018-11-03 ENCOUNTER — Telehealth: Payer: Self-pay | Admitting: *Deleted

## 2018-11-03 ENCOUNTER — Other Ambulatory Visit: Payer: Self-pay

## 2018-11-03 DIAGNOSIS — Z79891 Long term (current) use of opiate analgesic: Secondary | ICD-10-CM

## 2018-11-03 MED ORDER — OXYCODONE HCL 10 MG PO TABS: 10.0000 mg | ORAL_TABLET | Freq: Four times a day (QID) | ORAL | 0 refills | Status: DC | PRN

## 2018-11-03 NOTE — Telephone Encounter (Signed)
Call to [employer] for PA for Jardiance .  Information was given,  Approved 11/03/2018 thru 10/29/2018.  PA [id number]. I [id number].  Called to [facility] [phone].  Sander Nephew, RN 11/03/2018 11:35 AM.

## 2018-11-03 NOTE — Telephone Encounter (Signed)
Oxycodone HCl 10 MG TABS, needs PA.

## 2018-11-03 NOTE — Telephone Encounter (Signed)
Last rx written  3/17. Last OV12/18/19. Next OV  Has not been scheduled. UDS  07/02/18.

## 2018-11-04 NOTE — Telephone Encounter (Signed)
Prior Authorization faxed to [facility] along with 07/02/18 office visit with pcp.  Call made to pharmacy-out of pocket costs would be $29 (pt has discount card).  PA determination make take up to 72hrs.[name]4/21/20201:27 PM   Will contact pt once determination is back

## 2018-11-18 ENCOUNTER — Encounter: Payer: Self-pay | Admitting: Internal Medicine

## 2018-11-20 ENCOUNTER — Telehealth: Payer: Self-pay | Admitting: Internal Medicine

## 2018-11-20 NOTE — Telephone Encounter (Signed)
Unfortunately I am working nights for the month of May and will be unable to call her during normal business hours. Can you please place her on the [facility] schedule ASAP so that she can discuss the change with on of the other providers?

## 2018-11-20 NOTE — Telephone Encounter (Signed)
Pt calls and states that her pain med needs adjusting. Could you please call her dr Alfonse Spruce and discuss this, her dr at [facility] told her to call her pcp and that she agreed she needed to increase.

## 2018-11-20 NOTE — Telephone Encounter (Signed)
Pt wanting a call back about changing her Oxycodone HCl 10 MG TABS medication.

## 2018-11-21 NOTE — Telephone Encounter (Signed)
Pt called / informed her doctor is unavailable at this time and asked if she's willing to discuss her pain med concerns via a telehealth visit. She's agreeable; call transferred to front office.  [facility] tele health visit scheduled -  Monday 5/11 .

## 2018-11-24 ENCOUNTER — Other Ambulatory Visit: Payer: Self-pay

## 2018-11-24 ENCOUNTER — Ambulatory Visit (INDEPENDENT_AMBULATORY_CARE_PROVIDER_SITE_OTHER): Payer: Medicaid Other | Admitting: Internal Medicine

## 2018-11-24 DIAGNOSIS — M545 Low back pain: Secondary | ICD-10-CM | POA: Diagnosis not present

## 2018-11-24 DIAGNOSIS — S46912D Strain of unspecified muscle, fascia and tendon at shoulder and upper arm level, left arm, subsequent encounter: Secondary | ICD-10-CM

## 2018-11-24 DIAGNOSIS — X58XXXD Exposure to other specified factors, subsequent encounter: Secondary | ICD-10-CM

## 2018-11-24 DIAGNOSIS — G8929 Other chronic pain: Secondary | ICD-10-CM

## 2018-11-24 DIAGNOSIS — M25511 Pain in right shoulder: Secondary | ICD-10-CM

## 2018-11-24 DIAGNOSIS — Z79899 Other long term (current) drug therapy: Secondary | ICD-10-CM

## 2018-11-24 DIAGNOSIS — Z79891 Long term (current) use of opiate analgesic: Secondary | ICD-10-CM

## 2018-11-24 DIAGNOSIS — M797 Fibromyalgia: Secondary | ICD-10-CM

## 2018-11-24 DIAGNOSIS — Z791 Long term (current) use of non-steroidal anti-inflammatories (NSAID): Secondary | ICD-10-CM

## 2018-11-24 NOTE — Progress Notes (Signed)
   This is a telephone encounter between [facility] and [facility] [facility] on 11/24/2018 for her complaint of worsening pain. The visit was conducted with the patient located at home and [place]ica at [facility]. The patient's identity was confirmed using their DOB and current address. The patient has consented to being evaluated through a telephone encounter and understands the associated risks (an examination cannot be done and the patient may need to come in for an appointment) / benefits (allows the patient to remain at home, decreasing exposure to coronavirus). I personally spent 13 minutes on medical discussion.   CC: Worsening lower back and right shoulder pain.  HPI:  Ms.Jaisha Birkeland is a 58 y.o. with past medical history as listed below was given a call due to her complaint of worsening lower back and right shoulder pain.  Patient has a longstanding history of fibromyalgia and lower back pain.  She also has a torn muscle on left shoulder which seems improving.  She also follow-up with sports medicine and orthopedic surgery.  She normally gets oxycodone 10 mg every 6 hourly with 120 pills each month.  Her last fill was on November 04, 2018.  Per patient she is having worsening pain in her lower back radiating to both legs and right shoulder pain.  She thinks that she might have another torn rotator cuff muscle and on her right shoulder now.  Oxycodone helps for 3 to 4 hours and then pain get worse.  She is taking it every 6 hourly.  She is asking for an increase in dose. Patient used to go to a pain clinic a while ago, stating that it did not work for her and she will prefer coming to her primary care for pain needs.  Apparently told in a pain clinic that she should not combine Tylenol with oxycodone, rather use ibuprofen which she is using with not much relief.  Patient also uses meloxicam daily and Voltaren gel.  She is also taking Flexeril 20 mg daily.  Per patient she cannot sleep at night  properly due to pain.  Please see assessment and plan for her chronic opioid use.  Past Medical History:  Diagnosis Date  . 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 Arthritis   . Diabetes mellitus    oral tx  . GERD (gastroesophageal reflux disease)   . History of post-sterilization tuboplasty 2000  . Plantar fasciitis    Right  . Shortness of breath    with exertion  . Sleep apnea 5 plus yrs   study -pt could not sleep test inconclusive.   . Tear of meniscus of left knee    x2  . Tear of meniscus of right knee    Review of Systems: Negative except mentioned in HPI.   Assessment & Plan:   See Encounters Tab for problem based charting.  Patient discussed with Dr. Beryle Beams.

## 2018-11-24 NOTE — Assessment & Plan Note (Addendum)
Patient has tried multiple agents in the past for her chronic pain.  She needs in person evaluation for her new worsening right shoulder pain, she will need evaluation for any rotator cuff injury. She does not want to go back to her orthopedic at this time, stating that they are in [place] and she cannot go there.  -Advised her to add Tylenol with oxycodone and see if that helps.  She can either take Tylenol with her oxycodone or in between her oxycodone before their effects weans off. Her CMP done in 2018 was normal. -Should not be taking ibuprofen as she is already taking meloxicam daily. -Continue taking Flexeril and Voltaren gel. -She will make an appointment to be seen in [facility] within 1 to 2 weeks.  We will reassess during that follow-up visit if combination of Tylenol with oxycodone is working.  If there is a suspected right rotator cuff injury, she might need an ultrasound/MRI evaluation.

## 2018-11-25 NOTE — Progress Notes (Signed)
Medicine attending: Medical history, presenting problems, physical complaints, and medications, reviewed with resident physician Dr Lorella Nimrod on the day of the patient telephone consultation and I concur with her evaluation and management plan. Current complaint is acute on chronic shoulder pain. Recommendations made. In person visit will be scheduled to assess need for further eval i.e. an MRI.

## 2018-11-29 ENCOUNTER — Other Ambulatory Visit: Payer: Self-pay | Admitting: Oncology

## 2018-11-29 DIAGNOSIS — G8929 Other chronic pain: Secondary | ICD-10-CM

## 2018-12-01 ENCOUNTER — Ambulatory Visit: Payer: Medicaid Other | Admitting: Internal Medicine

## 2018-12-01 ENCOUNTER — Other Ambulatory Visit: Payer: Self-pay

## 2018-12-01 VITALS — BP 142/75 | HR 112 | Temp 98.9°F | Ht 61.0 in | Wt 199.0 lb

## 2018-12-01 DIAGNOSIS — M797 Fibromyalgia: Secondary | ICD-10-CM | POA: Diagnosis not present

## 2018-12-01 DIAGNOSIS — M25511 Pain in right shoulder: Secondary | ICD-10-CM

## 2018-12-01 DIAGNOSIS — M545 Low back pain: Secondary | ICD-10-CM | POA: Diagnosis present

## 2018-12-01 DIAGNOSIS — M25562 Pain in left knee: Secondary | ICD-10-CM | POA: Diagnosis not present

## 2018-12-01 DIAGNOSIS — M25552 Pain in left hip: Secondary | ICD-10-CM

## 2018-12-01 DIAGNOSIS — M7501 Adhesive capsulitis of right shoulder: Secondary | ICD-10-CM

## 2018-12-01 DIAGNOSIS — M5442 Lumbago with sciatica, left side: Secondary | ICD-10-CM

## 2018-12-01 DIAGNOSIS — M25512 Pain in left shoulder: Secondary | ICD-10-CM

## 2018-12-01 DIAGNOSIS — M25561 Pain in right knee: Secondary | ICD-10-CM

## 2018-12-01 DIAGNOSIS — G8929 Other chronic pain: Secondary | ICD-10-CM

## 2018-12-01 DIAGNOSIS — Z79891 Long term (current) use of opiate analgesic: Secondary | ICD-10-CM | POA: Diagnosis not present

## 2018-12-01 MED ORDER — OXYCODONE HCL 10 MG PO TABS: 10.0000 mg | ORAL_TABLET | Freq: Four times a day (QID) | ORAL | 0 refills | Status: DC | PRN

## 2018-12-01 MED ORDER — DICLOFENAC POTASSIUM 50 MG PO TABS: 50.0000 mg | ORAL_TABLET | Freq: Three times a day (TID) | ORAL | 0 refills | Status: DC

## 2018-12-01 NOTE — Assessment & Plan Note (Signed)
Patient has history of fibromyalgia, lower back pain, bilateral shoulder pains, bilateral knee pains, and bilateral hip pain.  She has been having this shoulder pain for a few years now and states that she has tried multiple medications and nothing seems to help.  She reports that it is a constant pain, that is sharp and stabbing in nature, and is worse with movement.  She had steroid injections in this arm in the past which she reports helped.  She had been contacted via telehealth visit and advised to start taking Tylenol with her oxycodone, however states that she has been unable to take the Tylenol because it causes her stomach to become upset.  She was requesting he prescription of the diclofenac because she felt like that helped with her pain.  She also reports that she has tried multiple medications for her fibromyalgia and joint pains but nothing seems to relieve her symptoms, she has tried Lyrica, Cymbalta, gabapentin, hydrocodone, tramadol, and cyclobenzaprine.    Patient sees orthopedics, pain management, and sports medicine for her multiple joint pain issues.  She has visited the pain clinic in [facility] multiple times within the past year, and has received multiple steroid joint injections and radiofrequency ablations. Patient did see the orthopedics in January of this year for her right shoulder pain, she has had injections in that shoulder that she reports helped, but only lasted for about a month.  Overall patient appears to have significant diffuse pain issues, that may be worsened by her fibromyalgia.  She may benefit from more physical therapy, and will need to continue to follow-up with orthopedics, sports medicine, and pain clinic.  We will refill her oxycodone at this time, however since she does not appear to have much benefit from it we will need to discuss decreasing this with her PCP.  Plan: -Referral for physical therapy -MRI of right shoulder -Continue oxycodone 10 mg every 6  hours -Add diclofenac 50 mg 3 times daily PRN, advised patient to stop taking meloxicam -Continue following up with orthopedics and pain management

## 2018-12-01 NOTE — Progress Notes (Signed)
   CC: Diffuse pain, lower back pain, bilateral shoulder pains, bilateral knee pain, left hip pain, fibromyalgia   HPI:  [name] is a 58 y.o.   Past Medical History:  Diagnosis Date  . 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 Arthritis   . Diabetes mellitus    oral tx  . GERD (gastroesophageal reflux disease)   . History of post-sterilization tuboplasty 2000  . Plantar fasciitis    Right  . Shortness of breath    with exertion  . Sleep apnea 5 plus yrs   study -pt could not sleep test inconclusive.   . Tear of meniscus of left knee    x2  . Tear of meniscus of right knee    Review of Systems: Ports pain in her right shoulder, left shoulder, left hip, left knee, and right knee.  Denies fevers, chills, nausea, vomiting, headaches, lightheadedness, dizziness, fatigue, or other symptoms.  Physical Exam:  Vitals:   12/01/18 1338  BP: (!) 142/75  Pulse: (!) 112  Temp: 98.9 F (37.2 C)  TempSrc: Oral  SpO2: 98%  Weight: 199 lb (90.3 kg)  Height: 5\' 1"  (1.549 m)   Physical Exam  Constitutional:  Tired appearing female, no acute distress  HENT:  Head: Normocephalic and atraumatic.  Cardiovascular: Normal rate, regular rhythm and normal heart sounds.  Pulmonary/Chest: Effort normal and breath sounds normal. No respiratory distress.  Musculoskeletal: Normal range of motion.        General: Tenderness (TTP over anterior aspect of both shoulders near biceps insertion, TTP over lateral left hip joint, and TTP over medial and lateral aspect of both knees.) present.  Skin: Skin is warm and dry.     Assessment & Plan:   See Encounters Tab for problem based charting.  Patient discussed with Dr. Angelia Mould

## 2018-12-01 NOTE — Patient Instructions (Signed)
[name],  It was a pleasure to see you today. Thank you for coming in.   Today we discussed your fibromyalgia and shoulder pain. In regards to this please start taking diclofenac 3 times a day for the next 5 days, please let us know how this is helping. Please stop taking the meloxicam while you are taking this. I have sent in a refferal for physical therapy. I have also ordered an MRI of your shoulder. Please conitnue to follow up with the pain management doctor and the  Orthopedics.    Please return to clinic in 1 month or sooner if needed.   Thank you again for coming in.   [name].D.

## 2018-12-01 NOTE — Assessment & Plan Note (Signed)
Patient has history of fibromyalgia, lower back pain, bilateral shoulder pains, bilateral knee pains, and bilateral hip pain.  She has been having this shoulder pain for a few years now and states that she has tried multiple medications and nothing seems to help.  She reports that it is a constant pain, that is sharp and stabbing in nature, and is worse with movement.  She had steroid injections in this arm in the past which she reports helped.  She had been contacted via telehealth visit and advised to start taking Tylenol with her oxycodone, however states that she has been unable to take the Tylenol because it causes her stomach to become upset.  She was requesting he prescription of the diclofenac because she felt like that helped with her pain.  She also reports that she has tried multiple medications for her fibromyalgia and joint pains but nothing seems to relieve her symptoms, she has tried Lyrica, Cymbalta, gabapentin, hydrocodone, tramadol, and cyclobenzaprine.    MRI of her left hip on 12/2017 showed minimal degenerative changes of the distal left gluteus medius and minimus tendons at the left greater trochanter, as well as focal slight edema in the posterior paraspinal musculature to the left of midline at L5-S1, possibly due to muscle strain versus degenerative changes. MRI of the left shoulder on 03/2016 showed subacromial subdeltoid bursitis, partial thickness bursal surface tearing of the distal supraspinatus tendon with moderate associated tendinopathy, moderate tendinopathy at the intra-articular segment of the long head of the biceps, borderline shoulder joint effusion mild synovitis.MRI spine on 09/2015 showed small shallow left foraminal disc protrusion at L4-5 with mild left foraminal stenosis, protruding disc without frank neural impingement, mild degenerative disc disease.  Patient sees orthopedics, pain management, and sports medicine for her multiple joint pain issues.  She has visited the  pain clinic in [facility] multiple times within the past year, and has received multiple steroid joint injections and radiofrequency ablations. Patient did see the orthopedics in January of this year for her right shoulder pain, she has had injections in that shoulder that she reports helped, but only lasted for about a month.  Overall patient appears to have significant diffuse pain issues, that may be worsened by her fibromyalgia.  She may benefit from more physical therapy, and will need to continue to follow-up with orthopedics, sports medicine, and pain clinic.  We will refill her oxycodone at this time, however since she does not appear to have much benefit from it we will need to discuss decreasing this with her PCP.  Plan: -Referral for physical therapy -MRI of right shoulder -Continue oxycodone 10 mg every 6 hours -Add diclofenac 50 mg 3 times daily PRN, advised patient to stop taking meloxicam -Continue following up with orthopedics and pain management

## 2018-12-02 NOTE — Progress Notes (Signed)
Internal Medicine Clinic Attending  Case discussed with Dr. Krienke at the time of the visit.  We reviewed the resident's history and exam and pertinent patient test results.  I agree with the assessment, diagnosis, and plan of care documented in the resident's note.    

## 2018-12-03 ENCOUNTER — Telehealth: Payer: Self-pay

## 2018-12-03 NOTE — Telephone Encounter (Signed)
Rtc, no answer

## 2018-12-03 NOTE — Telephone Encounter (Signed)
Requesting to speak with a nurse about meds. Please call back.  

## 2018-12-05 NOTE — Telephone Encounter (Signed)
Pt stated Diclofenac needs a prior auth - I will send request  to [facility]. Also she wanted to know why only 5 day supply; Informed per doctor's note : "start taking diclofenac 3 times a day for the next 5 days, please let us know how this is helping." She stated if her insurance pays for 5 tabs they will not pay for anymore until the next month.

## 2018-12-09 ENCOUNTER — Telehealth: Payer: Self-pay | Admitting: *Deleted

## 2018-12-09 NOTE — Telephone Encounter (Signed)
Call to [facility]. Tracks for PA for Diclofenac Tablets 50 mg .  Information was given.  Approved 12/09/2018 thru 06/07/2019.  [facility] on [address] was called and notified of.  Attempts to call patient message with husband that PA had been approved.  Spoke with representative from [place] patient can get 90 per 30 days if needed with the authorization.   [place] [phone].  I- [id number].  Sander Nephew, RN 12/09/2018 1:39 PM.

## 2018-12-10 NOTE — Telephone Encounter (Signed)
See [name] He telephone encounter from yesterday.

## 2018-12-11 ENCOUNTER — Telehealth: Payer: Self-pay | Admitting: Dietician

## 2018-12-11 NOTE — Telephone Encounter (Addendum)
Called [name] about her diabetes eye exam. The last time she saw Dr. [place] was 2012.  She would prefer to come here for a retinal exam. Appointment scheduled.

## 2018-12-19 ENCOUNTER — Telehealth: Payer: Self-pay | Admitting: Dietician

## 2018-12-19 ENCOUNTER — Ambulatory Visit: Payer: Medicaid Other | Admitting: Dietician

## 2018-12-19 ENCOUNTER — Other Ambulatory Visit: Payer: Self-pay | Admitting: Internal Medicine

## 2018-12-19 DIAGNOSIS — E119 Type 2 diabetes mellitus without complications: Secondary | ICD-10-CM

## 2018-12-19 DIAGNOSIS — Z794 Long term (current) use of insulin: Secondary | ICD-10-CM

## 2018-12-19 NOTE — Telephone Encounter (Signed)
[name] called to reschedule her diabetes retinal exam and asked if she could get an a1C done on the same day. She said her doctor in [place] whom she is seeing for her carpal tunnel requested it.

## 2018-12-19 NOTE — Telephone Encounter (Signed)
A1c lab order placed. Thank you.

## 2018-12-20 ENCOUNTER — Emergency Department (HOSPITAL_COMMUNITY)
Admission: EM | Admit: 2018-12-20 | Discharge: 2018-12-20 | Disposition: A | Discharge status: Home / Self Care | Payer: Medicaid Other | Attending: Emergency Medicine | Admitting: Emergency Medicine

## 2018-12-20 ENCOUNTER — Other Ambulatory Visit: Payer: Self-pay

## 2018-12-20 ENCOUNTER — Encounter (HOSPITAL_COMMUNITY): Payer: Self-pay

## 2018-12-20 ENCOUNTER — Emergency Department (HOSPITAL_COMMUNITY): Payer: Medicaid Other

## 2018-12-20 DIAGNOSIS — Z794 Long term (current) use of insulin: Secondary | ICD-10-CM | POA: Insufficient documentation

## 2018-12-20 DIAGNOSIS — R200 Anesthesia of skin: Secondary | ICD-10-CM | POA: Diagnosis not present

## 2018-12-20 DIAGNOSIS — R519 Headache, unspecified: Secondary | ICD-10-CM

## 2018-12-20 DIAGNOSIS — I10 Essential (primary) hypertension: Secondary | ICD-10-CM | POA: Diagnosis not present

## 2018-12-20 DIAGNOSIS — R51 Headache: Secondary | ICD-10-CM | POA: Insufficient documentation

## 2018-12-20 DIAGNOSIS — E119 Type 2 diabetes mellitus without complications: Secondary | ICD-10-CM | POA: Insufficient documentation

## 2018-12-20 DIAGNOSIS — R202 Paresthesia of skin: Secondary | ICD-10-CM | POA: Insufficient documentation

## 2018-12-20 DIAGNOSIS — R42 Dizziness and giddiness: Secondary | ICD-10-CM | POA: Diagnosis not present

## 2018-12-20 DIAGNOSIS — Z79899 Other long term (current) drug therapy: Secondary | ICD-10-CM | POA: Diagnosis not present

## 2018-12-20 DIAGNOSIS — Z5329 Procedure and treatment not carried out because of patient's decision for other reasons: Secondary | ICD-10-CM | POA: Diagnosis not present

## 2018-12-20 LAB — URINALYSIS, ROUTINE W REFLEX MICROSCOPIC
Bacteria, UA: NONE SEEN
Bilirubin Urine: NEGATIVE
Glucose, UA: >=500 mg/dL — AB
Hgb urine dipstick: NEGATIVE
Ketones, ur: NEGATIVE mg/dL
Nitrite: NEGATIVE
Protein, ur: NEGATIVE mg/dL
Specific Gravity, Urine: >1.046 — ABNORMAL HIGH (ref 1.005–1.030)
pH: 6 (ref 5.0–8.0)

## 2018-12-20 LAB — COMPREHENSIVE METABOLIC PANEL
ALT: 19 U/L (ref 0–44)
AST: 20 U/L (ref 15–41)
Albumin: 3.7 g/dL (ref 3.5–5.0)
Alkaline Phosphatase: 131 U/L — ABNORMAL HIGH (ref 38–126)
Anion gap: 9 (ref 5–15)
BUN: 12 mg/dL (ref 6–20)
CO2: 23 mmol/L (ref 22–32)
Calcium: 9.1 mg/dL (ref 8.9–10.3)
Chloride: 108 mmol/L (ref 98–111)
Creatinine, Ser: 0.57 mg/dL (ref 0.44–1.00)
GFR calc Af Amer: >60 mL/min (ref >60)
GFR calc non Af Amer: >60 mL/min (ref >60)
Glucose, Bld: 149 mg/dL — ABNORMAL HIGH (ref 70–99)
Potassium: 3.7 mmol/L (ref 3.5–5.1)
Sodium: 140 mmol/L (ref 135–145)
Total Bilirubin: 0.5 mg/dL (ref 0.3–1.2)
Total Protein: 6.6 g/dL (ref 6.5–8.1)

## 2018-12-20 LAB — RAPID URINE DRUG SCREEN, HOSP PERFORMED
Amphetamines: NOT DETECTED
Barbiturates: NOT DETECTED
Benzodiazepines: NOT DETECTED
Cocaine: NOT DETECTED
Opiates: POSITIVE — AB
Tetrahydrocannabinol: NOT DETECTED

## 2018-12-20 LAB — CBC
HCT: 42.6 % (ref 36.0–46.0)
Hemoglobin: 14.2 g/dL (ref 12.0–15.0)
MCH: 28.9 pg (ref 26.0–34.0)
MCHC: 33.3 g/dL (ref 30.0–36.0)
MCV: 86.6 fL (ref 80.0–100.0)
Platelets: 336 10*3/uL (ref 150–400)
RBC: 4.92 MIL/uL (ref 3.87–5.11)
RDW: 12.4 % (ref 11.5–15.5)
WBC: 12.5 10*3/uL — ABNORMAL HIGH (ref 4.0–10.5)
nRBC: 0 % (ref 0.0–0.2)

## 2018-12-20 LAB — PROTIME-INR
INR: 1 (ref 0.8–1.2)
Prothrombin Time: 13.3 seconds (ref 11.4–15.2)

## 2018-12-20 LAB — I-STAT CHEM 8, ED
BUN: 13 mg/dL (ref 6–20)
Calcium, Ion: 1.1 mmol/L — ABNORMAL LOW (ref 1.15–1.40)
Chloride: 107 mmol/L (ref 98–111)
Creatinine, Ser: 0.7 mg/dL (ref 0.44–1.00)
Glucose, Bld: 150 mg/dL — ABNORMAL HIGH (ref 70–99)
HCT: 43 % (ref 36.0–46.0)
Hemoglobin: 14.6 g/dL (ref 12.0–15.0)
Potassium: 3.6 mmol/L (ref 3.5–5.1)
Sodium: 140 mmol/L (ref 135–145)
TCO2: 25 mmol/L (ref 22–32)

## 2018-12-20 LAB — DIFFERENTIAL
Abs Immature Granulocytes: 0.03 10*3/uL (ref 0.00–0.07)
Basophils Absolute: 0 10*3/uL (ref 0.0–0.1)
Basophils Relative: 0 %
Eosinophils Absolute: 0.1 10*3/uL (ref 0.0–0.5)
Eosinophils Relative: 0 %
Immature Granulocytes: 0 %
Lymphocytes Relative: 34 %
Lymphs Abs: 4.2 10*3/uL — ABNORMAL HIGH (ref 0.7–4.0)
Monocytes Absolute: 0.6 10*3/uL (ref 0.1–1.0)
Monocytes Relative: 5 %
Neutro Abs: 7.6 10*3/uL (ref 1.7–7.7)
Neutrophils Relative %: 61 %

## 2018-12-20 LAB — SEDIMENTATION RATE: Sed Rate: 12 mm/hr (ref 0–22)

## 2018-12-20 LAB — I-STAT BETA HCG BLOOD, ED (MC, WL, AP ONLY): I-stat hCG, quantitative: 7.6 m[IU]/mL — ABNORMAL HIGH (ref <5)

## 2018-12-20 LAB — APTT: aPTT: 25 seconds (ref 24–36)

## 2018-12-20 LAB — ETHANOL: Alcohol, Ethyl (B): <10 mg/dL (ref <10)

## 2018-12-20 MED ORDER — SODIUM CHLORIDE 0.9 % IV BOLUS
1000.0000 mL | Freq: Once | INTRAVENOUS | Status: AC
  Administered 2018-12-20: 1000 mL via INTRAVENOUS

## 2018-12-20 MED ORDER — IOHEXOL 350 MG/ML SOLN
75.0000 mL | Freq: Once | INTRAVENOUS | Status: AC | PRN
  Administered 2018-12-20: 75 mL via INTRAVENOUS

## 2018-12-20 MED ORDER — KETOROLAC TROMETHAMINE 30 MG/ML IJ SOLN
30.0000 mg | Freq: Once | INTRAMUSCULAR | Status: AC
  Administered 2018-12-20: 30 mg via INTRAVENOUS
  Filled 2018-12-20: qty 1

## 2018-12-20 MED ORDER — METOCLOPRAMIDE HCL 5 MG/ML IJ SOLN
10.0000 mg | Freq: Once | INTRAMUSCULAR | Status: AC
  Administered 2018-12-20: 10 mg via INTRAVENOUS
  Filled 2018-12-20: qty 2

## 2018-12-20 MED ORDER — DIPHENHYDRAMINE HCL 50 MG/ML IJ SOLN
25.0000 mg | Freq: Once | INTRAMUSCULAR | Status: AC
  Administered 2018-12-20: 25 mg via INTRAVENOUS
  Filled 2018-12-20: qty 1

## 2018-12-20 NOTE — ED Notes (Signed)
Transported to xray 

## 2018-12-20 NOTE — ED Provider Notes (Signed)
[facility] EMERGENCY DEPARTMENT Provider Note   CSN: [id number] Arrival date & time: 12/20/18  0226    History   Chief Complaint Chief Complaint  Patient presents with   Migraine   Numbness    HPI [name] is a 58 y.o. female.     Patient with history of diabetes, chronic orthopedic pain, arthritis presenting with sudden onset headache to the left side of her head.  States this onset about 7 PM when she was driving home.  She states the time of the clock was 1908.  She felt the sudden pain and "popping sensation" and pressure to her left temple which then "released" but she has been having persistent pain in the left side of her head ever since.  This is associated with numbness to her left side of her face and left arm.  Contrary to triage note it is not the right side of her face.  When she got home at 7:00 she went to take a nap and woke up about 10:00 still with a headache and numbness.  She did not take anything for it at home.  She has not had this kind of headache in the past.  There is been no nausea, vomiting, fever.  No difficulty speaking or difficulty swallowing.  No weakness in her arms or legs.  She denies any visual changes.  No fever.  No chest pain or shortness of breath.  She does not take any anticoagulation.  The history is provided by the patient.  Migraine  Associated symptoms include headaches. Pertinent negatives include no abdominal pain and no shortness of breath.    Past Medical History:  Diagnosis Date   Adhesive capsulitis of right shoulder    with underlying tendinopathy rotator cuff   Arthritis    Diabetes mellitus    oral tx   GERD (gastroesophageal reflux disease)    History of post-sterilization tuboplasty 2000   Plantar fasciitis    Right   Shortness of breath    with exertion   Sleep apnea 5 plus yrs   study -pt could not sleep test inconclusive.    Tear of meniscus of left knee    x2   Tear of  meniscus of right knee     Patient Active Problem List   Diagnosis Date Noted   Chronic right shoulder pain 12/01/2018   Palpitations 02/26/2018   Left hip pain 12/05/2017   Restless legs syndrome (RLS) 10/24/2016   Long-term current use of opiate analgesic 03/01/2016   Hot flashes 12/29/2015   S/P left rotator cuff repair 12/09/2015   Family history of breast cancer 11/29/2015   Acromioclavicular joint arthritis 09/19/2015   Hypertension associated with diabetes ([place]) 12/17/2014   Pruritus 01/18/2014   Chronic back pain 06/15/2013   Insomnia 12/02/2012   Fibromyalgia 05/23/2012   Preventative health care 03/01/2011   Hyperlipidemia 07/27/2010   Type 2 diabetes mellitus without complication, with long-term current use of insulin ([place]) 07/26/2010   ADHESIVE CAPSULITIS, RIGHT 09/04/2006   Other tear of cartilage or meniscus of knee, current 09/04/2006    Past Surgical History:  Procedure Laterality Date   CHOLECYSTECTOMY     FOOT TENDON SURGERY     KNEE ARTHROSCOPY  10/10/2011   Procedure: ARTHROSCOPY KNEE;  Surgeon: Newt Minion, MD;  Location: [facility];  Service: Orthopedics;  Laterality: Right;  Right Knee Arthroscopy and Excision Medial Meniscus   MENISCUS REPAIR     R and L (L x2)  TONSILLECTOMY     TUBAL LIGATION       OB History   No obstetric history on file.      Home Medications    Prior to Admission medications   Medication Sig Start Date End Date Taking? Authorizing Provider  ACCU-CHEK FASTCLIX LANCETS MISC Check three times a day to check blood sugar. 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 insulin dependent 09/08/18   Sid Falcon, MD  atorvastatin (LIPITOR) 20 MG tablet Take 1 tablet (20 mg total) by mouth daily. 06/27/18   Lucious Groves, DO  B Complex CAPS TAKE ONE CAPSULE BY MOUTH EVERY DAY 01/07/18   Lenore Cordia, MD  Blood Glucose Monitoring Suppl (ACCU-CHEK GUIDE ME) w/Device KIT 1 each by Does not apply route 3 (three) times daily. 09/23/18    Carroll Sage, MD  cetaphil (CETAPHIL) lotion Apply 1 application topically as needed for dry skin. 01/23/17   Lenore Cordia, MD  cetirizine (ZYRTEC) 10 MG tablet Take 1 tablet (10 mg total) by mouth daily. 10/16/18   Carroll Sage, MD  clotrimazole (LOTRIMIN) 1 % cream Apply 1 application topically 2 (two) times daily. 02/26/18   Carroll Sage, MD  cyclobenzaprine (FLEXERIL) 10 MG tablet Take 1 tablet (10 mg total) by mouth every 12 (twelve) hours as needed for muscle spasms. 10/30/18   Sid Falcon, MD  diclofenac (CATAFLAM) 50 MG tablet Take 1 tablet (50 mg total) by mouth 3 (three) times daily. 12/01/18   [name], MD  diclofenac sodium (VOLTAREN) 1 % GEL Apply 2 g topically 4 (four) times daily. 10/16/18   Carroll Sage, MD  diphenhydrAMINE (BENADRYL) 25 mg capsule Take 1 capsule (25 mg total) by mouth every 6 (six) hours as needed. 10/16/18   Carroll Sage, MD  ferrous sulfate 325 (65 FE) MG tablet Take 1 tablet (325 mg total) by mouth daily. 04/02/17 04/02/18  Lenore Cordia, MD  FLUoxetine (PROZAC) 20 MG capsule Take 1 capsule (20 mg total) by mouth daily. 05/12/18 07/11/18  Bartholomew Crews, MD  glucose blood (ACCU-CHEK GUIDE) test strip Check blood sugar 3 times a day 09/23/18   Carroll Sage, MD  insulin aspart (NOVOLOG FLEXPEN) 100 UNIT/ML FlexPen Inject 8 units before each meal three times a day 09/23/18   Carroll Sage, MD  Insulin Glargine (LANTUS) 100 UNIT/ML Solostar Pen Inject 35 Units into the skin at bedtime. Diagnosis code E11.9. 10/04/18   Carroll Sage, MD  Insulin Pen Needle (PEN NEEDLES) 32G X 4 MM MISC 1 each by Does not apply route 5 (five) times daily. 09/23/18   Carroll Sage, MD  Insulin Syringe-Needle U-100 31G X 15/64" 0.3 ML MISC Please use for insulin administration 3 time daily. 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 Insulin dependent 09/27/17   Lenore Cordia, MD  JARDIANCE 10 MG TABS tablet TAKE 1 TABLET BY MOUTH EVERY DAY 02/04/18   Axel Filler, MD    liraglutide (VICTOZA) 18 MG/3ML SOPN Inject 0.1 mLs (0.6 mg total) into the skin daily for 7 days, THEN 0.2 mLs (1.2 mg total) daily. 06/25/18 09/30/18  Carroll Sage, MD  NYSTATIN powder Apply topically 3 (three) times daily. 03/13/17   Welford Roche, MD  Oxycodone HCl 10 MG TABS Take 1 tablet (10 mg total) by mouth 4 (four) times daily as needed (moderate pain). 12/01/18   [name], MD  pregabalin (LYRICA) 100 MG capsule Take 1 capsule (100 mg total) by mouth 2 (  two) times daily. 05/12/18   Bartholomew Crews, MD  rOPINIRole (REQUIP) 0.5 MG tablet TAKE 1 TABLET BY MOUTH EVERY NIGHT 1 TO 3 HOURS BEFORE BEDTIME 10/30/18   Annia Belt, MD  senna (SENOKOT) 8.6 MG tablet Take 1 tablet (8.6 mg total) by mouth daily as needed for constipation. 09/27/16   Lenore Cordia, MD  triamcinolone cream (KENALOG) 0.1 % Apply 1 application topically 2 (two) times daily. 03/14/17   Lenore Cordia, MD    Family History Family History  Problem Relation Age of Onset   Breast cancer Sister        both sisters have breast cancer    Social History Social History   Tobacco Use   Smoking status: Never Smoker   Smokeless tobacco: Never Used  Substance Use Topics   Alcohol use: No    Alcohol/week: 0.0 standard drinks   Drug use: No     Allergies   Ofloxacin; Mango butter; and Metformin and related   Review of Systems Review of Systems  Constitutional: Negative for activity change, appetite change and fever.  HENT: Negative for congestion and rhinorrhea.   Eyes: Negative for photophobia and visual disturbance.  Respiratory: Negative for cough, chest tightness and shortness of breath.   Gastrointestinal: Negative for abdominal pain, nausea and vomiting.  Genitourinary: Negative for dysuria and hematuria.  Musculoskeletal: Negative for arthralgias and myalgias.  Neurological: Positive for light-headedness and headaches. Negative for weakness.   all other systems are  negative except as noted in the HPI and PMH.     Physical Exam Updated Vital Signs BP (!) 152/77    Pulse 74    Temp 98.8 F (37.1 C) (Oral)    LMP 07/04/2016    SpO2 96%   Physical Exam Vitals signs and nursing note reviewed.  Constitutional:      General: She is not in acute distress.    Appearance: She is well-developed. She is obese.  HENT:     Head: Normocephalic and atraumatic.     Comments: Tenderness to palpation of left temple    Mouth/Throat:     Pharynx: No oropharyngeal exudate.  Eyes:     Conjunctiva/sclera: Conjunctivae normal.     Pupils: Pupils are equal, round, and reactive to light.  Neck:     Musculoskeletal: Normal range of motion and neck supple.     Comments: No meningismus. Cardiovascular:     Rate and Rhythm: Normal rate and regular rhythm.     Heart sounds: Normal heart sounds. No murmur.  Pulmonary:     Effort: Pulmonary effort is normal. No respiratory distress.     Breath sounds: Normal breath sounds.  Chest:     Chest wall: No tenderness.  Abdominal:     Palpations: Abdomen is soft.     Tenderness: There is no abdominal tenderness. There is no guarding or rebound.  Musculoskeletal: Normal range of motion.        General: No tenderness.  Skin:    General: Skin is warm.     Capillary Refill: Capillary refill takes less than 2 seconds.  Neurological:     General: No focal deficit present.     Mental Status: She is alert and oriented to person, place, and time. Mental status is at baseline.     Cranial Nerves: No cranial nerve deficit.     Motor: No abnormal muscle tone.     Coordination: Coordination normal.     Comments: CN 2-12 intact, no  ataxia on finger to nose, no nystagmus, 5/5 strength throughout, no pronator drift, Romberg negative, normal gait.   Subjective numbness to left face and left arm with equal grip strength.  Psychiatric:        Behavior: Behavior normal.      ED Treatments / Results  Labs (all labs ordered are  listed, but only abnormal results are displayed) Labs Reviewed  CBC - Abnormal; Notable for the following components:      Result Value   WBC 12.5 (*)    All other components within normal limits  DIFFERENTIAL - Abnormal; Notable for the following components:   Lymphs Abs 4.2 (*)    All other components within normal limits  COMPREHENSIVE METABOLIC PANEL - Abnormal; Notable for the following components:   Glucose, Bld 149 (*)    Alkaline Phosphatase 131 (*)    All other components within normal limits  RAPID URINE DRUG SCREEN, HOSP PERFORMED - Abnormal; Notable for the following components:   Opiates POSITIVE (*)    All other components within normal limits  URINALYSIS, ROUTINE W REFLEX MICROSCOPIC - Abnormal; Notable for the following components:   Specific Gravity, Urine >1.046 (*)    Glucose, UA >=500 (*)    Leukocytes,Ua TRACE (*)    All other components within normal limits  I-STAT CHEM 8, ED - Abnormal; Notable for the following components:   Glucose, Bld 150 (*)    Calcium, Ion 1.10 (*)    All other components within normal limits  I-STAT BETA HCG BLOOD, ED ([facility], [facility], [facility] ONLY) - Abnormal; Notable for the following components:   I-stat hCG, quantitative 7.6 (*)    All other components within normal limits  CSF CULTURE  ETHANOL  PROTIME-INR  APTT  SEDIMENTATION RATE  CSF CELL COUNT WITH DIFFERENTIAL  CSF CELL COUNT WITH DIFFERENTIAL  PROTEIN AND GLUCOSE, CSF    EKG EKG Interpretation  Date/Time:  Saturday December 20 2018 04:09:37 EDT Ventricular Rate:  68 PR Interval:    QRS Duration: 107 QT Interval:  423 QTC Calculation: 450 R Axis:   113 Text Interpretation:  Sinus rhythm Right axis deviation No significant change was found Confirmed by Ezequiel Essex [id number]) on 12/20/2018 4:13:30 AM   Radiology Ct Angio Head W Or Wo Contrast  Result Date: 12/20/2018 CLINICAL DATA:  Thunderclap headache EXAM: CT ANGIOGRAPHY HEAD AND NECK TECHNIQUE: Multidetector CT imaging of  the head and neck was performed using the standard protocol during bolus administration of intravenous contrast. Multiplanar CT image reconstructions and MIPs were obtained to evaluate the vascular anatomy. Carotid stenosis measurements (when applicable) are obtained utilizing NASCET criteria, using the distal internal carotid diameter as the denominator. CONTRAST:  30m OMNIPAQUE IOHEXOL 350 MG/ML SOLN COMPARISON:  None. FINDINGS: CT HEAD FINDINGS Brain: There is no mass, hemorrhage or extra-axial collection. The size and configuration of the ventricles and extra-axial CSF spaces are normal. There is no acute or chronic infarction. The brain parenchyma is normal. Skull: The visualized skull base, calvarium and extracranial soft tissues are normal. Sinuses/Orbits: No fluid levels or advanced mucosal thickening of the visualized paranasal sinuses. No mastoid or middle ear effusion. The orbits are normal. CTA NECK FINDINGS SKELETON: There is no bony spinal canal stenosis. No lytic or blastic lesion. OTHER NECK: Normal pharynx, larynx and major salivary glands. No cervical lymphadenopathy. Unremarkable thyroid gland. UPPER CHEST: No pneumothorax or pleural effusion. No nodules or masses. AORTIC ARCH: There is no calcific atherosclerosis of the aortic arch. There is  no aneurysm, dissection or hemodynamically significant stenosis of the visualized ascending aorta and aortic arch. Conventional 3 vessel aortic branching pattern. The visualized proximal subclavian arteries are widely patent. RIGHT CAROTID SYSTEM: --Common carotid artery: Widely patent origin without common carotid artery dissection or aneurysm. --Internal carotid artery: No dissection, occlusion or aneurysm. Mild atherosclerotic calcification at the carotid bifurcation without hemodynamically significant stenosis. --External carotid artery: No acute abnormality. LEFT CAROTID SYSTEM: --Common carotid artery: Widely patent origin without common carotid artery  dissection or aneurysm. --Internal carotid artery: No dissection, occlusion or aneurysm. Mild atherosclerotic calcification at the carotid bifurcation without hemodynamically significant stenosis. --External carotid artery: No acute abnormality. VERTEBRAL ARTERIES: Left dominant configuration. Both origins are clearly patent. No dissection, occlusion or flow-limiting stenosis to the skull base (V1-V3 segments). CTA HEAD FINDINGS POSTERIOR CIRCULATION: --Vertebral arteries: Normal V4 segments. --Posterior inferior cerebellar arteries (PICA): Patent origins from the vertebral arteries. --Anterior inferior cerebellar arteries (AICA): Patent origins from the basilar artery. --Basilar artery: Normal. --Superior cerebellar arteries: Normal. --Posterior cerebral arteries (PCA): Normal. Both originate from the basilar artery. Posterior communicating arteries (p-comm) are diminutive or absent. ANTERIOR CIRCULATION: --Intracranial internal carotid arteries: Normal. --Anterior cerebral arteries (ACA): Normal. Both A1 segments are present. Patent anterior communicating artery (a-comm). --Middle cerebral arteries (MCA): Normal. VENOUS SINUSES: As permitted by contrast timing, patent. ANATOMIC VARIANTS: None Review of the MIP images confirms the above findings. IMPRESSION: Normal head CT and CTA of the head and neck. Electronically Signed   By: Ulyses Jarred M.D.   On: 12/20/2018 04:12   Ct Angio Neck W And/or Wo Contrast  Result Date: 12/20/2018 CLINICAL DATA:  Thunderclap headache EXAM: CT ANGIOGRAPHY HEAD AND NECK TECHNIQUE: Multidetector CT imaging of the head and neck was performed using the standard protocol during bolus administration of intravenous contrast. Multiplanar CT image reconstructions and MIPs were obtained to evaluate the vascular anatomy. Carotid stenosis measurements (when applicable) are obtained utilizing NASCET criteria, using the distal internal carotid diameter as the denominator. CONTRAST:  11m  OMNIPAQUE IOHEXOL 350 MG/ML SOLN COMPARISON:  None. FINDINGS: CT HEAD FINDINGS Brain: There is no mass, hemorrhage or extra-axial collection. The size and configuration of the ventricles and extra-axial CSF spaces are normal. There is no acute or chronic infarction. The brain parenchyma is normal. Skull: The visualized skull base, calvarium and extracranial soft tissues are normal. Sinuses/Orbits: No fluid levels or advanced mucosal thickening of the visualized paranasal sinuses. No mastoid or middle ear effusion. The orbits are normal. CTA NECK FINDINGS SKELETON: There is no bony spinal canal stenosis. No lytic or blastic lesion. OTHER NECK: Normal pharynx, larynx and major salivary glands. No cervical lymphadenopathy. Unremarkable thyroid gland. UPPER CHEST: No pneumothorax or pleural effusion. No nodules or masses. AORTIC ARCH: There is no calcific atherosclerosis of the aortic arch. There is no aneurysm, dissection or hemodynamically significant stenosis of the visualized ascending aorta and aortic arch. Conventional 3 vessel aortic branching pattern. The visualized proximal subclavian arteries are widely patent. RIGHT CAROTID SYSTEM: --Common carotid artery: Widely patent origin without common carotid artery dissection or aneurysm. --Internal carotid artery: No dissection, occlusion or aneurysm. Mild atherosclerotic calcification at the carotid bifurcation without hemodynamically significant stenosis. --External carotid artery: No acute abnormality. LEFT CAROTID SYSTEM: --Common carotid artery: Widely patent origin without common carotid artery dissection or aneurysm. --Internal carotid artery: No dissection, occlusion or aneurysm. Mild atherosclerotic calcification at the carotid bifurcation without hemodynamically significant stenosis. --External carotid artery: No acute abnormality. VERTEBRAL ARTERIES: Left dominant configuration. Both origins are  clearly patent. No dissection, occlusion or flow-limiting  stenosis to the skull base (V1-V3 segments). CTA HEAD FINDINGS POSTERIOR CIRCULATION: --Vertebral arteries: Normal V4 segments. --Posterior inferior cerebellar arteries (PICA): Patent origins from the vertebral arteries. --Anterior inferior cerebellar arteries (AICA): Patent origins from the basilar artery. --Basilar artery: Normal. --Superior cerebellar arteries: Normal. --Posterior cerebral arteries (PCA): Normal. Both originate from the basilar artery. Posterior communicating arteries (p-comm) are diminutive or absent. ANTERIOR CIRCULATION: --Intracranial internal carotid arteries: Normal. --Anterior cerebral arteries (ACA): Normal. Both A1 segments are present. Patent anterior communicating artery (a-comm). --Middle cerebral arteries (MCA): Normal. VENOUS SINUSES: As permitted by contrast timing, patent. ANATOMIC VARIANTS: None Review of the MIP images confirms the above findings. IMPRESSION: Normal head CT and CTA of the head and neck. Electronically Signed   By: Ulyses Jarred M.D.   On: 12/20/2018 04:12    Procedures Procedures (including critical care time)  Medications Ordered in ED Medications  metoCLOPramide (REGLAN) injection 10 mg (has no administration in time range)  diphenhydrAMINE (BENADRYL) injection 25 mg (has no administration in time range)  sodium chloride 0.9 % bolus 1,000 mL (has no administration in time range)     Initial Impression / Assessment and Plan / ED Course  I have reviewed the triage vital signs and the nursing notes.  Pertinent labs & imaging results that were available during my care of the patient were reviewed by me and considered in my medical decision making (see chart for details).       Patient with sudden onset headache associated with left-sided numbness.  She is afebrile.  She does have some tenderness to palpation of her left temporal artery.  We will obtain CT angiogram to rule out aneurysm. She believes her headache started about 7:00.  There  is a greater than 6 hours.  So CT without contrast is not good enough to rule out subarachnoid hemorrhage.  Discussed with neurology Dr. Rory Percy who agrees with CTA as well as treatment for possible migraine.  He states subarachnoid less likely given the unilateral nature.  Will also check ESR to evaluate for suspected temporal arteritis though she has no visual changes. Dr. Rory Percy suggest treatment for migraine.  If her symptoms improve she will not need MRI but may need MRI if her numbness persists.  CT head is negative.  CTA is negative for aneurysm or other abnormality. ESR is normal with low suspicion for temporal arteritis.   Discussed with patient risks and benefits of lumbar puncture to rule out subarachnoid hemorrhage.  It is concerning that she had a sudden onset headache with a popping sensation.  She initially consented to the lumbar puncture but declined after speaking with her husband.  She understands that subarachnoid hemorrhage cannot be completely ruled out without a lumbar puncture but it is reassuring that her CTA is negative.  She appears to have capacity to refuse care and make her own medical decisions. Her numbness and headache are resolving with migraine cocktail including Toradol, Reglan and Benadryl.  Discussed with patient that her work-up was reassuring though we cannot completely rule out subarachnoid hemorrhage without a lumbar puncture.  She appears to have capacity to make medical decisions and continues to decline this test.  Her headache has resolved and her numbness has resolved.  No indication for MRI.  Discussed outpatient follow-up with neurology.  She understands she will be leaving [place] as she continues to decline lumbar puncture. Return precautions discussed. Final Clinical Impressions(s) / ED Diagnoses  Final diagnoses:  Bad headache    ED Discharge Orders    None       Daritza Brees, Annie Main, MD 12/20/18 [phone]

## 2018-12-20 NOTE — ED Triage Notes (Signed)
Pt c/o headache since yesterday at 21. LSW 1907. Headache was sudden and felt like pressure. Pain is 7/10. Feels numbness and tingling to right side of face.

## 2018-12-20 NOTE — Discharge Instructions (Addendum)
Your testing today is reassuring but you declined lumbar puncture.  We have a low suspicion for any kind of bleeding but we have not ruled out completely.  Follow-up with your neurologist for further evaluation of your headaches.  Return to the ED with unilateral weakness, numbness, difficulty speaking, difficulty swallowing or other concerns.

## 2018-12-22 ENCOUNTER — Ambulatory Visit (HOSPITAL_COMMUNITY): Admission: RE | Admit: 2018-12-22 | Payer: Medicaid Other | Source: Ambulatory Visit

## 2018-12-22 ENCOUNTER — Ambulatory Visit: Payer: Medicaid Other | Admitting: Dietician

## 2018-12-24 ENCOUNTER — Ambulatory Visit (HOSPITAL_COMMUNITY)
Admission: RE | Admit: 2018-12-24 | Discharge: 2018-12-24 | Disposition: A | Discharge status: Home / Self Care | Payer: Medicaid Other | Source: Ambulatory Visit | Attending: Internal Medicine | Admitting: Internal Medicine

## 2018-12-24 ENCOUNTER — Encounter: Payer: Self-pay | Admitting: *Deleted

## 2018-12-24 ENCOUNTER — Other Ambulatory Visit: Payer: Self-pay

## 2018-12-24 DIAGNOSIS — M7501 Adhesive capsulitis of right shoulder: Secondary | ICD-10-CM | POA: Diagnosis present

## 2018-12-29 ENCOUNTER — Ambulatory Visit (INDEPENDENT_AMBULATORY_CARE_PROVIDER_SITE_OTHER): Payer: Medicaid Other | Admitting: Dietician

## 2018-12-29 ENCOUNTER — Other Ambulatory Visit: Payer: Self-pay | Admitting: Dietician

## 2018-12-29 ENCOUNTER — Other Ambulatory Visit: Payer: Self-pay | Admitting: Internal Medicine

## 2018-12-29 ENCOUNTER — Encounter: Payer: Self-pay | Admitting: Dietician

## 2018-12-29 ENCOUNTER — Telehealth: Payer: Self-pay | Admitting: *Deleted

## 2018-12-29 DIAGNOSIS — E119 Type 2 diabetes mellitus without complications: Secondary | ICD-10-CM

## 2018-12-29 DIAGNOSIS — Z794 Long term (current) use of insulin: Secondary | ICD-10-CM

## 2018-12-29 DIAGNOSIS — G8929 Other chronic pain: Secondary | ICD-10-CM

## 2018-12-29 LAB — POCT GLYCOSYLATED HEMOGLOBIN (HGB A1C): Hemoglobin A1C: 7.2 % — AB (ref 4.0–5.6)

## 2018-12-29 LAB — GLUCOSE, CAPILLARY: Glucose-Capillary: 155 mg/dL — ABNORMAL HIGH (ref 70–99)

## 2018-12-29 LAB — HM DIABETES EYE EXAM

## 2018-12-29 MED ORDER — LIRAGLUTIDE 18 MG/3ML ~~LOC~~ SOPN: PEN_INJECTOR | SUBCUTANEOUS | 6 refills | Status: DC

## 2018-12-29 NOTE — Telephone Encounter (Signed)
States she needs PA on victoza, sending to [facility].

## 2018-12-29 NOTE — Progress Notes (Signed)
Retinal images were done and transmitted today. Butch Penny Plyler, RD 12/29/2018 10:25 AM.

## 2018-12-29 NOTE — Telephone Encounter (Signed)
Call to [facility] for PA for Victoza and Cyclobenzaprine.  Information was given for Victoza PA . Approved 12/29/2018 thru 12/24/2018.  Cyclobenzaprine does not require a PA.  [facility] on [address] was called and notified of.  PA -[id number].  [id number].  Sander Nephew, RN 12/29/2018 11:56 AM.

## 2018-12-31 ENCOUNTER — Encounter: Payer: Self-pay | Admitting: Internal Medicine

## 2018-12-31 ENCOUNTER — Ambulatory Visit: Payer: Medicaid Other | Admitting: Internal Medicine

## 2018-12-31 ENCOUNTER — Other Ambulatory Visit: Payer: Self-pay

## 2018-12-31 VITALS — BP 124/76 | HR 90 | Temp 98.5°F | Ht 60.0 in | Wt 202.1 lb

## 2018-12-31 DIAGNOSIS — E785 Hyperlipidemia, unspecified: Secondary | ICD-10-CM

## 2018-12-31 DIAGNOSIS — G8929 Other chronic pain: Secondary | ICD-10-CM | POA: Diagnosis not present

## 2018-12-31 DIAGNOSIS — R232 Flushing: Secondary | ICD-10-CM | POA: Diagnosis present

## 2018-12-31 DIAGNOSIS — Z8349 Family history of other endocrine, nutritional and metabolic diseases: Secondary | ICD-10-CM | POA: Diagnosis not present

## 2018-12-31 DIAGNOSIS — N3941 Urge incontinence: Secondary | ICD-10-CM

## 2018-12-31 DIAGNOSIS — M7501 Adhesive capsulitis of right shoulder: Secondary | ICD-10-CM

## 2018-12-31 DIAGNOSIS — Z0000 Encounter for general adult medical examination without abnormal findings: Secondary | ICD-10-CM

## 2018-12-31 DIAGNOSIS — Z79891 Long term (current) use of opiate analgesic: Secondary | ICD-10-CM

## 2018-12-31 DIAGNOSIS — R32 Unspecified urinary incontinence: Secondary | ICD-10-CM | POA: Diagnosis not present

## 2018-12-31 DIAGNOSIS — M5442 Lumbago with sciatica, left side: Secondary | ICD-10-CM

## 2018-12-31 DIAGNOSIS — M797 Fibromyalgia: Secondary | ICD-10-CM

## 2018-12-31 LAB — GLUCOSE, CAPILLARY: Glucose-Capillary: 199 mg/dL — ABNORMAL HIGH (ref 70–99)

## 2018-12-31 MED ORDER — OXYBUTYNIN CHLORIDE ER 10 MG PO TB24: 10.0000 mg | ORAL_TABLET | Freq: Every day | ORAL | 2 refills | Status: DC

## 2018-12-31 MED ORDER — DICLOFENAC POTASSIUM 50 MG PO TABS: 50.0000 mg | ORAL_TABLET | Freq: Three times a day (TID) | ORAL | 3 refills | Status: AC | PRN

## 2018-12-31 MED ORDER — B COMPLEX PO CAPS: 1.0000 | ORAL_CAPSULE | Freq: Every day | ORAL | 3 refills | Status: DC

## 2018-12-31 MED ORDER — OXYCODONE HCL 10 MG PO TABS: 10.0000 mg | ORAL_TABLET | Freq: Four times a day (QID) | ORAL | 0 refills | Status: DC | PRN

## 2018-12-31 MED ORDER — ATORVASTATIN CALCIUM 20 MG PO TABS: 20.0000 mg | ORAL_TABLET | Freq: Every day | ORAL | 3 refills | Status: DC

## 2018-12-31 NOTE — Progress Notes (Signed)
   CC: Follow-up of chronic pain  HPI:  [name] is a 58 y.o. female who presents for follow-up of chronic pain.  We see problem based charting for assessment and plan of each medical condition.  Past Medical History:  Diagnosis Date  . 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 Arthritis   . Diabetes mellitus    oral tx  . GERD (gastroesophageal reflux disease)   . History of post-sterilization tuboplasty 2000  . Plantar fasciitis    Right  . Shortness of breath    with exertion  . Sleep apnea 5 plus yrs   study -pt could not sleep test inconclusive.   . Tear of meniscus of left knee    x2  . Tear of meniscus of right knee    Review of Systems:   Review of Systems  Constitutional: Negative for chills and fever.  Respiratory: Negative for cough and shortness of breath.   Cardiovascular: Negative for chest pain and leg swelling.  Gastrointestinal: Negative for abdominal pain, diarrhea, nausea and vomiting.  Genitourinary: Negative for dysuria, frequency, hematuria and urgency.       Urinary incontinence  Musculoskeletal:       Lower back pain, left hip pain, right shoulder pain  Neurological: Negative for dizziness and headaches.  All other systems reviewed and are negative.   Physical Exam:  Vitals:   12/31/18 1530  BP: 124/76  Pulse: 90  Temp: 98.5 F (36.9 C)  TempSrc: Oral  SpO2: 98%  Weight: 202 lb 1.6 oz (91.7 kg)  Height: 5' (1.524 m)   Physical Exam Vitals signs reviewed.  Constitutional:      Appearance: Normal appearance.  HENT:     Head: Normocephalic and atraumatic.  Pulmonary:     Effort: Pulmonary effort is normal.     Breath sounds: Normal breath sounds.  Abdominal:     General: Abdomen is flat. Bowel sounds are normal.     Palpations: Abdomen is soft.  Musculoskeletal:     Comments: Point tenderness to palpation of the tear aspect of the right shoulder.  No pain with full range  of motion of the right shoulder.  Skin:    General: Skin is warm and dry.  Neurological:     Mental Status: She is alert and oriented to person, place, and time. Mental status is at baseline.  Psychiatric:        Mood and Affect: Mood normal.        Behavior: Behavior normal.     Assessment & Plan:   See Encounters Tab for problem based charting.  Patient discussed with Dr. Daryll Drown

## 2019-01-01 ENCOUNTER — Telehealth: Payer: Self-pay | Admitting: *Deleted

## 2019-01-01 DIAGNOSIS — N3946 Mixed incontinence: Secondary | ICD-10-CM | POA: Insufficient documentation

## 2019-01-01 DIAGNOSIS — R32 Unspecified urinary incontinence: Secondary | ICD-10-CM | POA: Insufficient documentation

## 2019-01-01 LAB — TSH: TSH: 1.24 u[IU]/mL (ref 0.450–4.500)

## 2019-01-01 LAB — URINALYSIS, ROUTINE W REFLEX MICROSCOPIC
Bilirubin, UA: NEGATIVE
Ketones, UA: NEGATIVE
Leukocytes,UA: NEGATIVE
Nitrite, UA: NEGATIVE
Protein,UA: NEGATIVE
RBC, UA: NEGATIVE
Specific Gravity, UA: >=1.03 — AB (ref 1.005–1.030)
Urobilinogen, Ur: 0.2 mg/dL (ref 0.2–1.0)
pH, UA: 5 (ref 5.0–7.5)

## 2019-01-01 NOTE — Assessment & Plan Note (Signed)
She reports over 1 year history of urinary incontinence.  She states that most days she has a sudden urge to urinate and run to the bathroom.  She denies any leakage of fluid or dribbling throughout the day.  She does urinate on herself very rarely after laughing or coughing.  Urinalysis performed today did not show any signs of a urinary tract infection.  I sent in a prescription of oxybutynin for possible urge incontinence.  We will plan on reevaluating symptoms at next clinic visit.

## 2019-01-01 NOTE — Telephone Encounter (Addendum)
Information was sent to [place] for PA for Oxycodone. Awaiting decision.  Sander Nephew, RN 01/01/2019 4:02 PM. Call to [facility] for results of PA request for Oxycodone.  Oxyodone approved 01/01/2019 thru 06/30/2019.  [place] [id number].  I- [id number] and [id number].  Sander Nephew, RN 01/06/2019 10:36 AM.

## 2019-01-01 NOTE — Assessment & Plan Note (Signed)
Atorvastatin refill sent today.

## 2019-01-01 NOTE — Assessment & Plan Note (Signed)
At the end of our visit [name] brings up her hot flashes.  She would like to get her TSH tested as multiple family members have had hyperthyroidism.  I explained to her that we could get her TSH today but she would need to make another follow-up appointment for a full evaluation of her hot flashes.  She expressed understanding and agreement.  TSH performed today and within normal limits.

## 2019-01-01 NOTE — Assessment & Plan Note (Signed)
Ms. Diloreto has a long history of fibromyalgia, lower back pain, bilateral shoulder pain, bilateral knee pain, bilateral hip pain.  She is currently on long-term opioid treatment with oxycodone 10 mg 4 times daily.  She was recently seen in our clinic on 12/01/2018 for her chronic pain and was given a prescription of diclofenac p.o. and referred to physical therapy.  She has not followed up with PT does state that diclofenac did help.  She would like to increase her oxycodone today.  I explained to her that she is on a very large amount and will need to follow-up with the pain clinic for further management as it would be inappropriate to increase her opioid use as risks outweigh benefits at such a high dose.  Have sent in a prescription for her diclofenac 50 mg 3 times daily as needed and encouraged her to seek physical therapy.  Pain clinic referral sent today.

## 2019-01-01 NOTE — Assessment & Plan Note (Signed)
Due for colon cancer and breast cancer screening.  Ordered a mammogram and FIT testing today.

## 2019-01-04 ENCOUNTER — Encounter: Payer: Self-pay | Admitting: *Deleted

## 2019-01-05 NOTE — Progress Notes (Signed)
Internal Medicine Clinic Attending  Case discussed with Dr. Prince at the time of the visit.  We reviewed the resident's history and exam and pertinent patient test results.  I agree with the assessment, diagnosis, and plan of care documented in the resident's note.   

## 2019-01-09 ENCOUNTER — Telehealth: Payer: Self-pay | Admitting: *Deleted

## 2019-01-09 NOTE — Telephone Encounter (Signed)
Pt wants COVID testing prior to surgery 01/2019 Try to call pt back, lm for rtc

## 2019-01-14 ENCOUNTER — Telehealth: Payer: Self-pay | Admitting: *Deleted

## 2019-01-14 NOTE — Telephone Encounter (Signed)
Pt called back and or scheduler has set appt for COVID testing

## 2019-01-14 NOTE — Telephone Encounter (Signed)
Pt is calling about being tested for [name], she is having surgery 7/10 and was told she needs to be tested and call her pcp

## 2019-01-14 NOTE — Telephone Encounter (Signed)
Requesting to speak with [name]. Please call pt back.  

## 2019-01-27 ENCOUNTER — Other Ambulatory Visit: Payer: Self-pay | Admitting: Internal Medicine

## 2019-01-27 ENCOUNTER — Telehealth: Payer: Self-pay | Admitting: Internal Medicine

## 2019-01-27 ENCOUNTER — Encounter: Payer: Self-pay | Admitting: Dietician

## 2019-01-27 DIAGNOSIS — E113393 Type 2 diabetes mellitus with moderate nonproliferative diabetic retinopathy without macular edema, bilateral: Secondary | ICD-10-CM

## 2019-01-27 DIAGNOSIS — R21 Rash and other nonspecific skin eruption: Secondary | ICD-10-CM

## 2019-01-27 DIAGNOSIS — Z794 Long term (current) use of insulin: Secondary | ICD-10-CM

## 2019-01-27 DIAGNOSIS — Z79891 Long term (current) use of opiate analgesic: Secondary | ICD-10-CM

## 2019-01-27 MED ORDER — OXYCODONE HCL 10 MG PO TABS: 10.0000 mg | ORAL_TABLET | Freq: Four times a day (QID) | ORAL | 0 refills | Status: DC | PRN

## 2019-01-27 MED ORDER — CETIRIZINE HCL 10 MG PO TABS: 10.0000 mg | ORAL_TABLET | Freq: Every day | ORAL | 3 refills | Status: DC

## 2019-01-27 NOTE — Addendum Note (Signed)
Addended by: Mitzi Hansen on: 01/27/2019 03:30 PM   Modules accepted: Orders

## 2019-01-27 NOTE — Telephone Encounter (Signed)
Confirmed with Pharmacy they received Rx for cetirizine that was sent by fax. They also have Rx for oxycodone. Hubbard Hartshorn, RN, BSN

## 2019-01-27 NOTE — Telephone Encounter (Signed)
Needs refill on Oxycodone HCl 10 MG TABS , pt needs a itching medicine  [facility] [id number] - [name], [place] ;pt contact [phone]

## 2019-01-27 NOTE — Progress Notes (Signed)
Recommendation is for follow up photographs in 6 months for moderate diabetic eye disease.

## 2019-01-27 NOTE — Telephone Encounter (Signed)
Patient wanted to discuss husband. Please see his chart. Hubbard Hartshorn, RN, BSN

## 2019-01-27 NOTE — Telephone Encounter (Signed)
Pt is requesting nurse to call back [phone]

## 2019-01-28 NOTE — Addendum Note (Signed)
Addended by: Hulan Fray on: 01/28/2019 08:33 PM   Modules accepted: Orders

## 2019-01-28 NOTE — Progress Notes (Unsigned)
Limited retinal scan reviewed. Reveals moderate retinopathy. Requires follow up scan in 71mo. Pyper does have insurance now and should be referred to ophthalmology at that time.

## 2019-02-09 ENCOUNTER — Other Ambulatory Visit: Payer: Self-pay | Admitting: Internal Medicine

## 2019-02-09 DIAGNOSIS — G2581 Restless legs syndrome: Secondary | ICD-10-CM

## 2019-02-09 MED ORDER — ROPINIROLE HCL 0.5 MG PO TABS: ORAL_TABLET | ORAL | 0 refills | Status: DC

## 2019-02-09 NOTE — Telephone Encounter (Signed)
Refill Request  rOPINIRole (REQUIP) 0.5 MG tablet  [id number] - [place], [place]

## 2019-03-02 ENCOUNTER — Other Ambulatory Visit: Payer: Self-pay | Admitting: Internal Medicine

## 2019-03-02 DIAGNOSIS — M5442 Lumbago with sciatica, left side: Secondary | ICD-10-CM

## 2019-03-02 DIAGNOSIS — G8929 Other chronic pain: Secondary | ICD-10-CM

## 2019-03-02 MED ORDER — OXYCODONE HCL 10 MG PO TABS: 10.0000 mg | ORAL_TABLET | Freq: Four times a day (QID) | ORAL | 0 refills | Status: DC | PRN

## 2019-03-02 MED ORDER — OXYCODONE-ACETAMINOPHEN 10-325 MG PO TABS: 1.0000 | ORAL_TABLET | Freq: Four times a day (QID) | ORAL | 0 refills | Status: DC | PRN

## 2019-03-02 MED ORDER — CYCLOBENZAPRINE HCL 10 MG PO TABS: ORAL_TABLET | ORAL | 1 refills | Status: DC

## 2019-03-02 NOTE — Telephone Encounter (Signed)
Called pt - stated she needs refill on Oxycodone 10 mg / 325 mg which is not on her current med list. Stated she has been having a lot of pain after her surgery on Friday so she has been taking more and requesting an early refill. Asking her doctor not to put on rx "refill every 30 days". Thanks

## 2019-03-02 NOTE — Telephone Encounter (Signed)
Pls make certain drugstore cancels the percocet. Filling it Q 30 days is part of contract I checked database - no Rx from surgeon Needs PCP appt next 90 days

## 2019-03-02 NOTE — Telephone Encounter (Signed)
Needs refill on pain medicine ;pt contact [phone]   [id number] - [place], [place]

## 2019-03-02 NOTE — Telephone Encounter (Signed)
[facility] - stated they saw note to cancel Percocet rx.

## 2019-03-02 NOTE — Telephone Encounter (Signed)
Also requesting a refill on Cyclobenzaprine 10 mg.

## 2019-03-04 ENCOUNTER — Other Ambulatory Visit: Payer: Self-pay | Admitting: *Deleted

## 2019-03-04 DIAGNOSIS — Z794 Long term (current) use of insulin: Secondary | ICD-10-CM

## 2019-03-04 DIAGNOSIS — E119 Type 2 diabetes mellitus without complications: Secondary | ICD-10-CM

## 2019-03-04 NOTE — Telephone Encounter (Signed)
Pharm calls and states they rec'd an oxy script 8/14 for oxy 5 from a md in [place] Wanted to know if they should fill both scripts advised no, only script from [facility]. Do you agree?

## 2019-03-05 NOTE — Telephone Encounter (Signed)
Needs refill on JARDIANCE 10 MG TABS tablet [id number] - [place], [place]  ;pt contact [phone]   Pt is wanting to do 90-day supply

## 2019-03-10 ENCOUNTER — Inpatient Hospital Stay: Admission: RE | Admit: 2019-03-10 | Payer: Medicaid Other | Source: Ambulatory Visit

## 2019-03-11 MED ORDER — JARDIANCE 10 MG PO TABS: 10.0000 mg | ORAL_TABLET | Freq: Every day | ORAL | 3 refills | Status: DC

## 2019-03-12 ENCOUNTER — Other Ambulatory Visit: Payer: Self-pay | Admitting: Internal Medicine

## 2019-03-12 NOTE — Telephone Encounter (Signed)
Need refill on liraglutide (VICTOZA) 18 MG/3ML SOPN  ;pt contact [phone]   [facility] [id number] - [place], [place]

## 2019-03-12 NOTE — Telephone Encounter (Signed)
Called pharm, pt has refills available they will call pt

## 2019-03-25 ENCOUNTER — Other Ambulatory Visit: Payer: Self-pay | Admitting: *Deleted

## 2019-03-25 DIAGNOSIS — E119 Type 2 diabetes mellitus without complications: Secondary | ICD-10-CM

## 2019-03-25 DIAGNOSIS — Z794 Long term (current) use of insulin: Secondary | ICD-10-CM

## 2019-03-25 DIAGNOSIS — N3941 Urge incontinence: Secondary | ICD-10-CM

## 2019-03-26 MED ORDER — NOVOLOG FLEXPEN 100 UNIT/ML ~~LOC~~ SOPN: PEN_INJECTOR | SUBCUTANEOUS | 5 refills | Status: DC

## 2019-03-26 MED ORDER — OXYBUTYNIN CHLORIDE ER 10 MG PO TB24: 10.0000 mg | ORAL_TABLET | Freq: Every day | ORAL | 2 refills | Status: DC

## 2019-03-30 NOTE — Assessment & Plan Note (Addendum)
Medications: jardiance 10mg  daily, novolog, lantus 35U daily, victoza 1.2mg  daily Atorvastatin 40mg . A1C today:7.7 -was 7.2 in June. Likely elevated from change in diet and recent surgery. Patient notes she has been eating more poptarts and jello recently. She also started drinking a protein shake. Taking medication compliantly without noted sided effects. No hypoglycemic events noted. Follows diet fairly well. Last eye exam within the past year Last foot exam > 1year ago  Does not check blood sugars at home. Plan -lipid panel -foot exam -urine microalbum/cr ratio -encouraged to work on diet and activity. -encouraged her to follow up with our diabetic educator. Educated regarding sugar content in protein shakes.

## 2019-03-30 NOTE — Assessment & Plan Note (Addendum)
Current mediations: oxycodone 10mg  qid Indication: chronic back pain, fibromyalgia Denies significant side effects including sedation and constipation. Medication not interfering with ADLs Last UDS 12/2018 and appropriate PDMP reviewed and appropriate Discussed alternative pharmacological and non-pharmacological pain relief therapies. She has been to pain clinic in the past and reports that she does not wish to go back.  Would like increase in oxycodone however we discussed that this would not be safe given her other central acting medications including requip and flexeril. She recognizes that this is a concern.  Plan: continue current management

## 2019-03-30 NOTE — Assessment & Plan Note (Addendum)
Due for colonoscopy Due for mammogram Due for pap

## 2019-03-30 NOTE — Assessment & Plan Note (Signed)
On atorvastatin 40mg  daily. Denies issues with side effects. Plan: recheck lipid panel today

## 2019-03-30 NOTE — Assessment & Plan Note (Signed)
Urge incontinence. Currently on oxybutynin 10mg  daily. Reports ** improvement in sx. Plan: continue current regimen.

## 2019-03-30 NOTE — Progress Notes (Signed)
CC: diabetes  SUBJECTIVE: 58 y.o. female for follow up of diabetes.   See problem based charting for further documentation.    Current Outpatient Medications  Medication Sig Dispense Refill  . ACCU-CHEK FASTCLIX LANCETS MISC Check three times a day to check blood sugar. 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 insulin dependent 306 each 1  . atorvastatin (LIPITOR) 20 MG tablet Take 1 tablet (20 mg total) by mouth daily. 90 tablet 3  . B Complex CAPS Take 1 capsule by mouth daily. 30 capsule 3  . Blood Glucose Monitoring Suppl (ACCU-CHEK GUIDE ME) w/Device KIT 1 each by Does not apply route 3 (three) times daily. 1 kit 0  . cetaphil (CETAPHIL) lotion Apply 1 application topically as needed for dry skin. 236 mL 0  . cetirizine (ZYRTEC) 10 MG tablet Take 1 tablet (10 mg total) by mouth daily. 90 tablet 3  . clotrimazole (LOTRIMIN) 1 % cream Apply 1 application topically 2 (two) times daily. 30 g 0  . cyclobenzaprine (FLEXERIL) 10 MG tablet TAKE 1 TABLET(10 MG) BY MOUTH EVERY 12 HOURS AS NEEDED FOR MUSCLE SPASMS 60 tablet 1  . diclofenac sodium (VOLTAREN) 1 % GEL Apply 2 g topically 4 (four) times daily. 3 Tube 3  . diphenhydrAMINE (BENADRYL) 25 mg capsule Take 1 capsule (25 mg total) by mouth every 6 (six) hours as needed. 30 capsule 2  . empagliflozin (JARDIANCE) 10 MG TABS tablet Take 10 mg by mouth daily. 90 mg 3  . glucose blood (ACCU-CHEK GUIDE) test strip Check blood sugar 3 times a day 100 each 12  . insulin aspart (NOVOLOG FLEXPEN) 100 UNIT/ML FlexPen Inject 8 units before each meal three times a day 15 mL 5  . Insulin Glargine (LANTUS) 100 UNIT/ML Solostar Pen Inject 35 Units into the skin at bedtime. Diagnosis code E11.9. 45 mL 3  . Insulin Pen Needle (PEN NEEDLES) 32G X 4 MM MISC 1 each by Does not apply route 5 (five) times daily. 200 each 8  . Insulin Syringe-Needle U-100 31G X 15/64" 0.3 ML MISC Please use for insulin administration 3 time daily. 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 Insulin dependent 200 each 1  .  liraglutide (VICTOZA) 18 MG/3ML SOPN Inject 0.1 mLs (0.6 mg total) into the skin daily for 7 days, THEN 0.2 mLs (1.2 mg total) daily. 3 pen 6  . NYSTATIN powder Apply topically 3 (three) times daily. 30 g 6  . oxybutynin (DITROPAN XL) 10 MG 24 hr tablet Take 1 tablet (10 mg total) by mouth daily. 30 tablet 2  . Oxycodone HCl 10 MG TABS Take 1 tablet (10 mg total) by mouth 4 (four) times daily as needed. 120 tablet 0  . rOPINIRole (REQUIP) 0.5 MG tablet TAKE 1 TABLET BY MOUTH EVERY NIGHT 1 TO 3 HOURS BEFORE BEDTIME 90 tablet 0  . triamcinolone cream (KENALOG) 0.1 % Apply 1 application topically 2 (two) times daily. 453.6 g 0   No current facility-administered medications for this visit.     Past Medical History:  Diagnosis Date  . 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 Arthritis   . Diabetes mellitus    oral tx  . GERD (gastroesophageal reflux disease)   . History of post-sterilization tuboplasty 2000  . Plantar fasciitis    Right  . Shortness of breath    with exertion  . Sleep apnea 5 plus yrs   study -pt could not sleep test inconclusive.   . Tear  of meniscus of left knee    x2  . Tear of meniscus of right knee    Review of Systems:  No polyuria, polydipsia, blurry vision, chest pain, dyspnea or claudication.   No fevers, chills, dizziness or lightheadedness. No abd pain, n/v.  OBJECTIVE: Today's Vitals   04/02/19 1427  BP: 126/79  Pulse: 81  Temp: 98.1 F (36.7 C)  TempSrc: Oral  SpO2: 99%  Weight: 205 lb (93 kg)   Body mass index is 40.04 kg/m.   GENERAL: well appearing, in no apparent distress CARDIAC: heart regular rate and rhythm, PULMONARY: lung sounds clear to auscultation ABDOMEN: bowel sounds active.  SKIN: well healing wound to left palm from dupuytren contracture procedure 3 weeks ago NEURO: CN II-XII grossly intact. Alert/oriented.    Assessment & Plan:   See orders for this visit as documented in the  electronic medical record. See Encounters Tab for problem based charting.  Patient seen with Dr. Elwanda Brooklyn, MD Internal Medicine Resident-PGY1 04/02/19

## 2019-03-30 NOTE — Assessment & Plan Note (Signed)
On requip 0.5 mg Reports improvement in symptoms with this. Plan: continue current regimen

## 2019-04-02 ENCOUNTER — Encounter: Payer: Self-pay | Admitting: Internal Medicine

## 2019-04-02 ENCOUNTER — Other Ambulatory Visit: Payer: Self-pay

## 2019-04-02 ENCOUNTER — Ambulatory Visit: Payer: Medicaid Other | Admitting: Internal Medicine

## 2019-04-02 VITALS — BP 126/79 | HR 81 | Temp 98.1°F | Wt 205.0 lb

## 2019-04-02 DIAGNOSIS — M797 Fibromyalgia: Secondary | ICD-10-CM | POA: Diagnosis not present

## 2019-04-02 DIAGNOSIS — G2581 Restless legs syndrome: Secondary | ICD-10-CM

## 2019-04-02 DIAGNOSIS — E669 Obesity, unspecified: Secondary | ICD-10-CM | POA: Insufficient documentation

## 2019-04-02 DIAGNOSIS — F1190 Opioid use, unspecified, uncomplicated: Secondary | ICD-10-CM | POA: Insufficient documentation

## 2019-04-02 DIAGNOSIS — G8929 Other chronic pain: Secondary | ICD-10-CM | POA: Diagnosis not present

## 2019-04-02 DIAGNOSIS — M549 Dorsalgia, unspecified: Secondary | ICD-10-CM

## 2019-04-02 DIAGNOSIS — Z79891 Long term (current) use of opiate analgesic: Secondary | ICD-10-CM | POA: Diagnosis not present

## 2019-04-02 DIAGNOSIS — Z794 Long term (current) use of insulin: Secondary | ICD-10-CM

## 2019-04-02 DIAGNOSIS — Z23 Encounter for immunization: Secondary | ICD-10-CM

## 2019-04-02 DIAGNOSIS — E119 Type 2 diabetes mellitus without complications: Secondary | ICD-10-CM

## 2019-04-02 DIAGNOSIS — Z79899 Other long term (current) drug therapy: Secondary | ICD-10-CM

## 2019-04-02 DIAGNOSIS — E113393 Type 2 diabetes mellitus with moderate nonproliferative diabetic retinopathy without macular edema, bilateral: Secondary | ICD-10-CM | POA: Diagnosis present

## 2019-04-02 DIAGNOSIS — E11319 Type 2 diabetes mellitus with unspecified diabetic retinopathy without macular edema: Secondary | ICD-10-CM | POA: Diagnosis not present

## 2019-04-02 DIAGNOSIS — R32 Unspecified urinary incontinence: Secondary | ICD-10-CM

## 2019-04-02 DIAGNOSIS — N3941 Urge incontinence: Secondary | ICD-10-CM

## 2019-04-02 DIAGNOSIS — E785 Hyperlipidemia, unspecified: Secondary | ICD-10-CM | POA: Diagnosis not present

## 2019-04-02 DIAGNOSIS — Z0000 Encounter for general adult medical examination without abnormal findings: Secondary | ICD-10-CM

## 2019-04-02 LAB — POCT GLYCOSYLATED HEMOGLOBIN (HGB A1C): Hemoglobin A1C: 7.7 % — AB (ref 4.0–5.6)

## 2019-04-02 LAB — GLUCOSE, CAPILLARY: Glucose-Capillary: 117 mg/dL — ABNORMAL HIGH (ref 70–99)

## 2019-04-02 MED ORDER — INSULIN GLARGINE 100 UNIT/ML SOLOSTAR PEN: 35.0000 [IU] | PEN_INJECTOR | Freq: Every day | SUBCUTANEOUS | 3 refills | Status: DC

## 2019-04-02 MED ORDER — LIRAGLUTIDE 18 MG/3ML ~~LOC~~ SOPN: PEN_INJECTOR | SUBCUTANEOUS | 6 refills | Status: DC

## 2019-04-02 NOTE — Patient Instructions (Signed)
It was a pleasure seeing you today, [name]. You're A1C is 7.7 today. Please check in with Butch Penny regarding your diet.  Please follow up in 3 months. Please feel free to reach out me sooner if anything comes up in the meantime. You can contact me via MyChart or calling the clinic.  Take care!

## 2019-04-02 NOTE — Assessment & Plan Note (Addendum)
Patient notes worsening symptoms since surgery 3 weeks ago. Discussed how fibromyalgia can flare in these circumstances. Discussed add on medications including duloxetine, gabapentin, TCAs however patient notes that she has tried these in the past without relief of sx.  Plan: encouraged increased activity and improved sleep to help with symptoms.

## 2019-04-03 LAB — MICROALBUMIN / CREATININE URINE RATIO
Creatinine, Urine: 145.9 mg/dL
Microalb/Creat Ratio: 4 mg/g creat (ref 0–29)
Microalbumin, Urine: 5.6 ug/mL

## 2019-04-03 LAB — LIPID PANEL
Chol/HDL Ratio: 2.4 ratio (ref 0.0–4.4)
Cholesterol, Total: 135 mg/dL (ref 100–199)
HDL: 56 mg/dL (ref >39)
LDL Chol Calc (NIH): 63 mg/dL (ref 0–99)
Triglycerides: 85 mg/dL (ref 0–149)
VLDL Cholesterol Cal: 16 mg/dL (ref 5–40)

## 2019-04-03 NOTE — Progress Notes (Signed)
Internal Medicine Clinic Attending  I saw and evaluated the patient.  I personally confirmed the key portions of the history and exam documented by Dr. Christian   and I reviewed pertinent patient test results.  The assessment, diagnosis, and plan were formulated together and I agree with the documentation in the resident's note.  

## 2019-04-06 ENCOUNTER — Other Ambulatory Visit: Payer: Self-pay

## 2019-04-06 NOTE — Telephone Encounter (Signed)
It is ready for pick up at pharm, called pharm and confirmed, also has script for next month

## 2019-04-06 NOTE — Telephone Encounter (Signed)
Oxycodone HCl 10 MG TABS   Is not at the pharmacy, pt states she is in pain. Pt is using  [facility] [phone] - [name], [place] [phone] (Phone) [phone] (Fax)

## 2019-04-20 ENCOUNTER — Other Ambulatory Visit: Payer: Self-pay | Admitting: Internal Medicine

## 2019-04-20 DIAGNOSIS — G2581 Restless legs syndrome: Secondary | ICD-10-CM

## 2019-04-20 DIAGNOSIS — E119 Type 2 diabetes mellitus without complications: Secondary | ICD-10-CM

## 2019-04-20 DIAGNOSIS — Z794 Long term (current) use of insulin: Secondary | ICD-10-CM

## 2019-04-20 MED ORDER — ROPINIROLE HCL 0.5 MG PO TABS: ORAL_TABLET | ORAL | 0 refills | Status: DC

## 2019-04-20 MED ORDER — PEN NEEDLES 32G X 4 MM MISC: 1.0000 | Freq: Every day | 1 refills | Status: DC

## 2019-04-20 NOTE — Telephone Encounter (Signed)
Refill Request  rOPINIRole (REQUIP) 0.5 MG tablet Insulin Pen Needle (PEN NEEDLES) 32G X 4 MM MISC  [facility] [id number] - [place], [place] - [place]

## 2019-04-23 ENCOUNTER — Other Ambulatory Visit: Payer: Self-pay

## 2019-04-23 NOTE — Telephone Encounter (Signed)
Requesting to speak with a nurse about med. Please call pt back.  

## 2019-04-23 NOTE — Telephone Encounter (Signed)
LM for rtc 

## 2019-04-28 ENCOUNTER — Other Ambulatory Visit: Payer: Self-pay | Admitting: Internal Medicine

## 2019-04-28 DIAGNOSIS — G8929 Other chronic pain: Secondary | ICD-10-CM

## 2019-04-28 DIAGNOSIS — M5441 Lumbago with sciatica, right side: Secondary | ICD-10-CM

## 2019-05-05 ENCOUNTER — Other Ambulatory Visit: Payer: Self-pay

## 2019-05-05 DIAGNOSIS — Z79891 Long term (current) use of opiate analgesic: Secondary | ICD-10-CM

## 2019-05-05 NOTE — Telephone Encounter (Signed)
Oxycodone HCl 10 MG TABS, REFILL REQUEST @  [facility] [phone] - [name], [place] AT [place] [phone] (Phone) [phone] (Fax)

## 2019-05-05 NOTE — Telephone Encounter (Signed)
Last rx written 03/02/19. Last OV 04/02/19. Next OV  Has not been scheduled. UDS 12/20/18.

## 2019-05-06 NOTE — Telephone Encounter (Signed)
Pt called - waiting on refill for her pain med - informed she called yesterday and we give the doctors 48 hrs turn around time. Stated she did not get her Oxycodone refill last month; the pharmacy had to use an august rx. Upset now she has wait.

## 2019-05-07 NOTE — Telephone Encounter (Signed)
Scheduled lab only appt 05/08/2019 @ 9:30am.

## 2019-05-07 NOTE — Telephone Encounter (Signed)
Pt called requesting the med to be filled by today, states she is in pain. Please call pt back.

## 2019-05-08 ENCOUNTER — Other Ambulatory Visit: Payer: Medicaid Other

## 2019-05-08 ENCOUNTER — Other Ambulatory Visit: Payer: Self-pay

## 2019-05-08 DIAGNOSIS — Z79891 Long term (current) use of opiate analgesic: Secondary | ICD-10-CM

## 2019-05-13 LAB — TOXASSURE SELECT,+ANTIDEPR,UR

## 2019-05-21 ENCOUNTER — Telehealth: Payer: Self-pay | Admitting: Internal Medicine

## 2019-05-21 NOTE — Telephone Encounter (Signed)
Lm for rtc 

## 2019-05-21 NOTE — Telephone Encounter (Signed)
Pt contact pt [phone]

## 2019-05-22 NOTE — Telephone Encounter (Signed)
Pt rtc, states she needs DME for a brace, please call her and she can explain exactly what she needs

## 2019-05-25 ENCOUNTER — Other Ambulatory Visit: Payer: Self-pay

## 2019-05-25 ENCOUNTER — Ambulatory Visit (INDEPENDENT_AMBULATORY_CARE_PROVIDER_SITE_OTHER): Payer: Medicaid Other | Admitting: Internal Medicine

## 2019-05-25 DIAGNOSIS — M549 Dorsalgia, unspecified: Secondary | ICD-10-CM

## 2019-05-25 DIAGNOSIS — G8929 Other chronic pain: Secondary | ICD-10-CM

## 2019-05-25 DIAGNOSIS — M23207 Derangement of unspecified meniscus due to old tear or injury, left knee: Secondary | ICD-10-CM

## 2019-05-25 DIAGNOSIS — K219 Gastro-esophageal reflux disease without esophagitis: Secondary | ICD-10-CM | POA: Diagnosis not present

## 2019-05-25 DIAGNOSIS — E119 Type 2 diabetes mellitus without complications: Secondary | ICD-10-CM

## 2019-05-25 NOTE — Assessment & Plan Note (Signed)
[name] is a 58 y/o female, with a PMH of chronic back pain, Left meniscal tear, GERD, and DM, who I spoke with during a telehealth visit, with the need for a new Left knee brace.  Patient states that her current brace she has had does not fit appropriately. She states that the brace has a metal side (Left side of her knee) and a plastic side (Right side of her knee). The metal side of her brace is stable, but the plastic side has begun to bulge out, which has affected the patient's gait. Additionally patient is losing support of the brace due to her brace straps continue to slip off.   Patient states that since her gait changes she has had increased back pain. She describes the pain as "very sharp and constant" that currently rates a 5/10 on the pain scale. She has had two epidurals this month for increased back pain. Additionally, patient states that she had an event at [facility] two weeks ago. She states that while she was shopping she had a high level of pain (above 10 on the pain scale) and fell in the [place], and needed a wheelchair in the store to continue her shopping.   Due to patient's increased back pain, unstable gait, and degradation of her DME after three years, it was discussed with the patient, and the supervising physician, the need for measurement for a new brace.

## 2019-05-25 NOTE — Telephone Encounter (Signed)
Order for left knee brace, along with today's OV note, demographics, and insurance card faxed to the [facility] at [phone]. Hubbard Hartshorn, BSN, RN-BC

## 2019-05-25 NOTE — Telephone Encounter (Addendum)
Returned call to patient. States current left knee brace is not giving her proper support which in turn increases back pain. Orthopaedist who gave current back brace does not carry any other type. Patient scheduled for telehealth visit in [facility] to discuss need for new brace. Will fax order to the [facility] where she can get measured for brace. Hubbard Hartshorn, BSN, RN-BC

## 2019-05-25 NOTE — Progress Notes (Addendum)
  [facility] Internal Medicine Residency Telephone Encounter Continuity Care Appointment  HPI:   This telephone encounter was created for [name] on 05/25/2019 for the following purpose/cc Left Knee Brace.   Past Medical History:  Past Medical History:  Diagnosis Date  . 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 Arthritis   . Diabetes mellitus    oral tx  . GERD (gastroesophageal reflux disease)   . History of post-sterilization tuboplasty 2000  . Plantar fasciitis    Right  . Shortness of breath    with exertion  . Sleep apnea 5 plus yrs   study -pt could not sleep test inconclusive.   . Tear of meniscus of left knee    x2  . Tear of meniscus of right knee       ROS:  Review of Systems  Constitutional: Negative for chills, fever and malaise/fatigue.  Eyes: Negative for blurred vision, double vision and photophobia.  Respiratory: Negative for cough.   Cardiovascular: Negative for chest pain, palpitations and orthopnea.  Gastrointestinal: Negative for abdominal pain, constipation, diarrhea, nausea and vomiting.  Musculoskeletal: Positive for back pain. Negative for myalgias.  Neurological: Negative for dizziness and headaches.     Assessment / Plan / Recommendations:   Please see A&P under problem oriented charting for assessment of the patient's acute and chronic medical conditions.   As always, pt is advised that if symptoms worsen or new symptoms arise, they should go to an urgent care facility or to to ER for further evaluation.   Consent and Medical Decision Making:   Patient discussed with Dr. Daryll Drown  This is a telephone encounter between [facility] and Maudie Mercury on 05/25/2019 for left knee brace. The visit was conducted with the patient located at home and Maudie Mercury at [facility]. The patient's identity was confirmed using their DOB and current address. The patient has consented to being  evaluated through a telephone encounter and understands the associated risks (an examination cannot be done and the patient may need to come in for an appointment) / benefits (allows the patient to remain at home, decreasing exposure to coronavirus). I personally spent 8 minutes on medical discussion.

## 2019-05-27 NOTE — Progress Notes (Signed)
Internal Medicine Clinic Attending  I spoke with and evaluated the patient.  I personally confirmed the key portions of the history and telephone conversation documented by Dr. Gilford Rile and I reviewed pertinent patient test results.  The assessment, diagnosis, and plan were formulated together and I agree with the documentation in the resident's note.

## 2019-06-03 ENCOUNTER — Telehealth: Payer: Self-pay | Admitting: *Deleted

## 2019-06-03 ENCOUNTER — Other Ambulatory Visit: Payer: Self-pay | Admitting: Internal Medicine

## 2019-06-03 NOTE — Telephone Encounter (Signed)
Information was sent through [employer] for PA for Liraglutide was set to [employer].  Awaiting determination.  Confirmation # [id number] Nelva Nay, RN 06/03/2019 3:30 PM.

## 2019-06-03 NOTE — Telephone Encounter (Signed)
PA for Lirglutide was approved 06/03/2019 thru 05/28/2020.  Sander Nephew, RN 06/03/2019 3:54 PM.

## 2019-06-03 NOTE — Telephone Encounter (Signed)
Per pharm pt needs PA for liraglutide

## 2019-06-03 NOTE — Telephone Encounter (Signed)
Refill Request  Oxycodone HCl 10 MG TABSOxycodone HCl 10 MG TABS  liraglutide (VICTOZA) 18 MG/3ML SOPN   [id number] - [place], [place] - [place]

## 2019-06-05 ENCOUNTER — Telehealth: Payer: Self-pay | Admitting: *Deleted

## 2019-06-05 MED ORDER — OXYCODONE HCL 10 MG PO TABS: 10.0000 mg | ORAL_TABLET | Freq: Four times a day (QID) | ORAL | 0 refills | Status: DC | PRN

## 2019-06-05 NOTE — Telephone Encounter (Addendum)
Patient's husband notified refill on oxycodone has been sent. Hubbard Hartshorn, BSN, RN-BC

## 2019-06-05 NOTE — Telephone Encounter (Signed)
Patient calling back about pain prescription and states she will not have any more as of Sunday AM. Requesting it be taken care of before we close for the day and call her to let her know when done.

## 2019-06-05 NOTE — Telephone Encounter (Signed)
This is being addressed in refill encounter begun 06/03/2019. Hubbard Hartshorn, BSN, RN-BC

## 2019-06-29 ENCOUNTER — Other Ambulatory Visit: Payer: Self-pay

## 2019-06-29 ENCOUNTER — Telehealth: Payer: Self-pay | Admitting: Internal Medicine

## 2019-06-29 DIAGNOSIS — M5441 Lumbago with sciatica, right side: Secondary | ICD-10-CM

## 2019-06-29 DIAGNOSIS — G8929 Other chronic pain: Secondary | ICD-10-CM

## 2019-06-29 MED ORDER — CYCLOBENZAPRINE HCL 10 MG PO TABS: ORAL_TABLET | ORAL | 1 refills | Status: DC

## 2019-06-29 NOTE — Telephone Encounter (Signed)
Thank you.  The form has been placed in your box.

## 2019-06-29 NOTE — Telephone Encounter (Signed)
Rec'd call from [place] at the [facility] reporting they have been waiting since [phone] for a KO Certificate of Medical necessity/CMN to be completed. Form has been refaxed today per Sharyn Lull.  Please call back

## 2019-06-30 ENCOUNTER — Other Ambulatory Visit: Payer: Self-pay | Admitting: *Deleted

## 2019-07-03 MED ORDER — OXYCODONE HCL 10 MG PO TABS: 10.0000 mg | ORAL_TABLET | Freq: Four times a day (QID) | ORAL | 0 refills | Status: DC | PRN

## 2019-07-06 ENCOUNTER — Telehealth: Payer: Self-pay | Admitting: Internal Medicine

## 2019-07-06 ENCOUNTER — Other Ambulatory Visit: Payer: Self-pay | Admitting: Internal Medicine

## 2019-07-06 MED ORDER — OXYCODONE HCL 10 MG PO TABS: 10.0000 mg | ORAL_TABLET | Freq: Four times a day (QID) | ORAL | 0 refills | Status: DC | PRN

## 2019-07-06 NOTE — Telephone Encounter (Signed)
Patient has been out of pain med x 3 days and is still awaiting knee brace. She is aware that PA was started today and that it can take 48-72 hours to receive approval from medicaid. Patient requesting 12 tabs that she will buy out of pocket to cover her till she can receive med covered by insurance. Will forward to PCP and Attending Pool. Hubbard Hartshorn, BSN, RN-BC

## 2019-07-06 NOTE — Telephone Encounter (Signed)
Received fax the pharmacy - Oxycodone is not covered by pt's insurance; needs to be changed or needs an prior authorization. I will request to pt's PCp and Gladys Herbin.

## 2019-07-06 NOTE — Telephone Encounter (Signed)
Needs refill on Oxycodone HCl 10 MG TABS  ;pt contact [phone]  [id number] - [place], [place] AT [address]

## 2019-07-06 NOTE — Telephone Encounter (Signed)
This was addressed in phone encounter started 06/29/2019. Hubbard Hartshorn, BSN, RN-BC

## 2019-07-06 NOTE — Telephone Encounter (Signed)
PA request/Records faxed to [place] and sent for review...Marland KitchenDespina Hidden Cassady12/21/20204:41 PM

## 2019-07-06 NOTE — Telephone Encounter (Signed)
Pls contact pt unable to get knee brace [phone]

## 2019-07-06 NOTE — Telephone Encounter (Signed)
Form received from PCP today and faxed to the [facility]. Confirmed with Elmyra Ricks at [facility] that they received the form. States it may not be till next week that they receive authorization from [place]. Patient made aware. Hubbard Hartshorn, BSN, RN-BC

## 2019-07-06 NOTE — Telephone Encounter (Signed)
Confirmed with Apolonio Schneiders at [facility] they received Rx for oxycodone #12 and will not run this through patient's insurance. Patient will pay out of pocket. Patient notified and is very Patent attorney. Hubbard Hartshorn, BSN, RN-BC

## 2019-07-08 ENCOUNTER — Telehealth: Payer: Self-pay | Admitting: Internal Medicine

## 2019-07-08 NOTE — Telephone Encounter (Signed)
Pt is wanting to know if the physician can write her a prescription for pain for 4 days [phone]

## 2019-07-08 NOTE — Telephone Encounter (Signed)
Checking on prior authorization for medicine, pt will be out of medicine for the holidays; pls contact [phone]

## 2019-07-08 NOTE — Telephone Encounter (Signed)
Dr. Darrick Meigs is here.  Please call patient and advise.  Thanks

## 2019-07-11 NOTE — Telephone Encounter (Signed)
Please advise to come to walk in clinic.

## 2019-07-13 NOTE — Telephone Encounter (Signed)
Called pt - stated she has been in pain all weekend; took Ibuprofen and old Tramadol she had. Informed PA was started last week - stated she had called the pharmacy, PA did not go thru. I asked Regino Schultze H to f/u.

## 2019-07-14 ENCOUNTER — Telehealth: Payer: Self-pay | Admitting: Internal Medicine

## 2019-07-14 ENCOUNTER — Telehealth: Payer: Self-pay | Admitting: *Deleted

## 2019-07-14 NOTE — Telephone Encounter (Signed)
Information was resent on [employer].  Representative stated that fax was not received.

## 2019-07-14 NOTE — Telephone Encounter (Signed)
Call to [employer] on 07/13/2019 representative stated that faxed PA had not been received. Information was resent  to [employer].  RTC to [employer] on 07/14/2019.  PA was approved 07/13/2019 thru 01/09/2019.  [id number]. I [id number].  Call to [facility] at [phone] to notify them of the approval.  Sander Nephew, RN 07/14/2019 10:45 AM.

## 2019-08-04 ENCOUNTER — Other Ambulatory Visit: Payer: Self-pay | Admitting: *Deleted

## 2019-08-04 DIAGNOSIS — G2581 Restless legs syndrome: Secondary | ICD-10-CM

## 2019-08-04 MED ORDER — ROPINIROLE HCL 0.5 MG PO TABS: ORAL_TABLET | ORAL | 0 refills | Status: DC

## 2019-08-06 ENCOUNTER — Other Ambulatory Visit: Payer: Self-pay | Admitting: Internal Medicine

## 2019-08-06 MED ORDER — OXYCODONE HCL 10 MG PO TABS: 10.0000 mg | ORAL_TABLET | Freq: Four times a day (QID) | ORAL | 0 refills | Status: DC | PRN

## 2019-08-06 NOTE — Telephone Encounter (Signed)
Needs refill on Oxycodone HCl 10 MG TABS, allergy medicine  ;pt contact [phone]   [id number] - [place], [place]

## 2019-08-07 ENCOUNTER — Encounter: Payer: Medicaid Other | Admitting: Family Medicine

## 2019-08-10 ENCOUNTER — Other Ambulatory Visit: Payer: Self-pay | Admitting: Internal Medicine

## 2019-08-10 NOTE — Telephone Encounter (Signed)
Pt requesting a call back about the date her   Oxycodone HCl 10 MG TABS is due as well as the Prior Authorization.

## 2019-08-10 NOTE — Telephone Encounter (Signed)
Dr Darrick Meigs, per the pharmacy if they fill oxy on 1/29 it will actually be 31 days not 30 days from last fill date. Do you want 30 or 31 days? If 30 they will need a new script. Does not need PA this month per pharmacist thanks

## 2019-08-12 ENCOUNTER — Other Ambulatory Visit: Payer: Self-pay | Admitting: *Deleted

## 2019-08-12 MED ORDER — OXYCODONE HCL 10 MG PO TABS: 10.0000 mg | ORAL_TABLET | Freq: Four times a day (QID) | ORAL | 0 refills | Status: DC | PRN

## 2019-08-12 NOTE — Telephone Encounter (Signed)
Pt's pharmacy does not have oxy and will not until sometime fri or sat. Can you [facility] [address]. Will cancel the one at [address]

## 2019-08-18 ENCOUNTER — Other Ambulatory Visit: Payer: Self-pay

## 2019-08-18 ENCOUNTER — Ambulatory Visit: Payer: Medicaid Other | Admitting: Internal Medicine

## 2019-08-18 ENCOUNTER — Encounter: Payer: Self-pay | Admitting: Internal Medicine

## 2019-08-18 VITALS — BP 135/70 | HR 81 | Temp 98.4°F | Wt 197.7 lb

## 2019-08-18 DIAGNOSIS — E11319 Type 2 diabetes mellitus with unspecified diabetic retinopathy without macular edema: Secondary | ICD-10-CM | POA: Diagnosis not present

## 2019-08-18 DIAGNOSIS — E113393 Type 2 diabetes mellitus with moderate nonproliferative diabetic retinopathy without macular edema, bilateral: Secondary | ICD-10-CM | POA: Diagnosis not present

## 2019-08-18 DIAGNOSIS — Z794 Long term (current) use of insulin: Secondary | ICD-10-CM

## 2019-08-18 DIAGNOSIS — Z79891 Long term (current) use of opiate analgesic: Secondary | ICD-10-CM | POA: Diagnosis not present

## 2019-08-18 DIAGNOSIS — M7501 Adhesive capsulitis of right shoulder: Secondary | ICD-10-CM | POA: Diagnosis present

## 2019-08-18 DIAGNOSIS — G8929 Other chronic pain: Secondary | ICD-10-CM | POA: Diagnosis not present

## 2019-08-18 DIAGNOSIS — M1990 Unspecified osteoarthritis, unspecified site: Secondary | ICD-10-CM

## 2019-08-18 DIAGNOSIS — Z79899 Other long term (current) drug therapy: Secondary | ICD-10-CM | POA: Diagnosis not present

## 2019-08-18 DIAGNOSIS — E119 Type 2 diabetes mellitus without complications: Secondary | ICD-10-CM

## 2019-08-18 LAB — BASIC METABOLIC PANEL
Anion gap: 11 (ref 5–15)
BUN: 10 mg/dL (ref 6–20)
CO2: 26 mmol/L (ref 22–32)
Calcium: 9.3 mg/dL (ref 8.9–10.3)
Chloride: 103 mmol/L (ref 98–111)
Creatinine, Ser: 0.74 mg/dL (ref 0.44–1.00)
GFR calc Af Amer: >60 mL/min (ref >60)
GFR calc non Af Amer: >60 mL/min (ref >60)
Glucose, Bld: 175 mg/dL — ABNORMAL HIGH (ref 70–99)
Potassium: 3.7 mmol/L (ref 3.5–5.1)
Sodium: 140 mmol/L (ref 135–145)

## 2019-08-18 LAB — POCT GLYCOSYLATED HEMOGLOBIN (HGB A1C): Hemoglobin A1C: 7.8 % — AB (ref 4.0–5.6)

## 2019-08-18 LAB — GLUCOSE, CAPILLARY: Glucose-Capillary: 180 mg/dL — ABNORMAL HIGH (ref 70–99)

## 2019-08-18 MED ORDER — MELOXICAM 15 MG PO TABS: 15.0000 mg | ORAL_TABLET | Freq: Every day | ORAL | 2 refills | Status: DC

## 2019-08-18 MED ORDER — MELOXICAM 15 MG PO TABS: 15.0000 mg | ORAL_TABLET | Freq: Every day | ORAL | 1 refills | Status: DC

## 2019-08-18 NOTE — Patient Instructions (Addendum)
It was a pleasure seeing you today!  Your A1C is 7.8. Please come back in 3 months to have this rechecked. I am also going to check your kidney function and will send your meloxicam into the pharmacy.

## 2019-08-19 ENCOUNTER — Encounter: Payer: Self-pay | Admitting: Internal Medicine

## 2019-08-19 MED ORDER — JARDIANCE 10 MG PO TABS: 10.0000 mg | ORAL_TABLET | Freq: Every day | ORAL | 3 refills | Status: DC

## 2019-08-19 MED ORDER — INSULIN GLARGINE 100 UNIT/ML SOLOSTAR PEN: 35.0000 [IU] | PEN_INJECTOR | Freq: Every day | SUBCUTANEOUS | 3 refills | Status: DC

## 2019-08-19 MED ORDER — LIRAGLUTIDE 18 MG/3ML ~~LOC~~ SOPN: PEN_INJECTOR | SUBCUTANEOUS | 6 refills | Status: DC

## 2019-08-19 NOTE — Assessment & Plan Note (Signed)
Chronic osteoarthritis with severe pain treated with chronic opioids.  Current medications: oxycodone 10mg  qid prn Patient presents today for follow up of her chronic pain due to osteoarthritis. She notes that she recently saw an orthopedic surgeon who recommended a total left knee replacement however requesting that her A1C be <7. She still has significant pain despite being treated with chronic opioids however they do improve her quality of life. She is also seen at the pain clinic where she receives her injections.  She notes that she has also found mobic helpful in the past and would like to try this again. We discussed other pain management including Cymbalta which she says she has tried before with little benefit. Plan -15mg  Mobic sent to pharmacy -BMP to check renal function -Continue chronic opioid therapy. Appreciate the pain clinic's assistance.

## 2019-08-19 NOTE — Assessment & Plan Note (Signed)
Chronic Type 2 Diabetes Mellitus Current Medications:  Jardiance 10mg  daily, novolog, lantus 35U HS She reports compliance with her medications.  A1C today is 7.8. Eye exam UTD. Plan -continue current management. Refills sent to pharmacy -follow up 3 mo

## 2019-08-19 NOTE — Progress Notes (Signed)
   CC: chronic pain, chronic T2DM HPI:  [name] is a 59 y.o. female who presents for follow up of her chronic pain which is treated with chronic opioids and follow up of her chronic T2DM  Please see problem based assessment and plan for additional details.     Past Medical History:  Diagnosis Date  . 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 Arthritis   . Diabetes mellitus    oral tx  . GERD (gastroesophageal reflux disease)   . History of post-sterilization tuboplasty 2000  . Plantar fasciitis    Right  . Shortness of breath    with exertion  . Sleep apnea 5 plus yrs   study -pt could not sleep test inconclusive.   . Tear of meniscus of left knee    x2  . Tear of meniscus of right knee     Review of Systems:  Review of Systems - General ROS: negative for - chills or fever Musculoskeletal ROS: positive for - joint pain and joint stiffness   Physical Exam:  Vitals:   08/18/19 1557  BP: 135/70  Pulse: 81  Temp: 98.4 F (36.9 C)  TempSrc: Skin  SpO2: 98%  Weight: 197 lb 11.2 oz (89.7 kg)    GENERAL: well appearing, in no apparent distress MSK: diffuse joint pain with ROM involving her bilateral knees and hips, left elbow, and bilateral shoulders.   Assessment & Plan:   Chronic osteoarthritis with severe pain treated with chronic opioids.  Current medications: oxycodone 10mg  qid prn Patient presents today for follow up of her chronic pain due to osteoarthritis. She notes that she recently saw an orthopedic surgeon who recommended a total left knee replacement however requesting that her A1C be <7. She still has significant pain despite being treated with chronic opioids however they do improve her quality of life. She is also seen at the pain clinic where she receives her injections.  She notes that she has also found mobic helpful in the past and would like to try this again. We discussed other pain  management including Cymbalta which she says she has tried before with little benefit. Plan -15mg  Mobic sent to pharmacy -BMP to check renal function -Continue chronic opioid therapy. Appreciate the pain clinic's assistance.  Chronic Type 2 Diabetes Mellitus Current Medications:  Jardiance 10mg  daily, novolog, lantus 35U HS She reports compliance with her medications.  A1C today is 7.8. Eye exam UTD. Plan -continue current management. Refills sent to pharmacy -follow up 3 mo   Patient is in agreement with the plan and endorses no further questions at this time.  Patient discussed with Dr. Angelia Mould  Mitzi Hansen, MD Internal Medicine Resident-PGY1 08/19/19

## 2019-08-23 NOTE — Progress Notes (Signed)
Internal Medicine Clinic Attending  Case discussed with Dr. Christian at the time of the visit.  We reviewed the resident's history and exam and pertinent patient test results.  I agree with the assessment, diagnosis, and plan of care documented in the resident's note.    

## 2019-08-28 ENCOUNTER — Encounter: Payer: Medicaid Other | Admitting: Family Medicine

## 2019-08-28 ENCOUNTER — Other Ambulatory Visit: Payer: Self-pay | Admitting: *Deleted

## 2019-08-28 DIAGNOSIS — G8929 Other chronic pain: Secondary | ICD-10-CM

## 2019-08-28 DIAGNOSIS — M5442 Lumbago with sciatica, left side: Secondary | ICD-10-CM

## 2019-08-31 MED ORDER — CYCLOBENZAPRINE HCL 10 MG PO TABS: ORAL_TABLET | ORAL | 1 refills | Status: DC

## 2019-09-04 ENCOUNTER — Ambulatory Visit: Payer: Medicaid Other | Admitting: Family Medicine

## 2019-09-04 ENCOUNTER — Other Ambulatory Visit: Payer: Self-pay

## 2019-09-04 VITALS — BP 130/70 | Ht 60.0 in | Wt 200.0 lb

## 2019-09-04 DIAGNOSIS — M7741 Metatarsalgia, right foot: Secondary | ICD-10-CM | POA: Diagnosis present

## 2019-09-04 DIAGNOSIS — M7742 Metatarsalgia, left foot: Secondary | ICD-10-CM | POA: Diagnosis not present

## 2019-09-05 ENCOUNTER — Encounter: Payer: Self-pay | Admitting: Family Medicine

## 2019-09-05 NOTE — Progress Notes (Signed)
Patient was fitted for a : standard, cushioned, semi-rigid orthotic. The orthotic was heated, placed on the orthotic stand. The patient was positioned in subtalar neutral position and 10 degrees of ankle dorsiflexion in a weight bearing stance on the heated orthotic blank After completion of molding, a stable base was applied to the orthotic blank. The blank was ground to a stable position for weight bearing. Blank: RED EVA foam Base:White F3 Posting:right MT pad small

## 2019-09-07 ENCOUNTER — Other Ambulatory Visit: Payer: Self-pay | Admitting: Internal Medicine

## 2019-09-07 NOTE — Telephone Encounter (Signed)
Need refill on Oxycodone HCl 10 MG TABS ;pt contact [phone]   [id number] - [place], [place] AT [address]

## 2019-09-07 NOTE — Telephone Encounter (Signed)
Last rx written 08/14/19. Last OV  08/18/19. Next OV has not been scheduled. UDS  05/08/19.

## 2019-09-08 MED ORDER — OXYCODONE HCL 10 MG PO TABS: 10.0000 mg | ORAL_TABLET | Freq: Four times a day (QID) | ORAL | 0 refills | Status: DC | PRN

## 2019-09-08 MED ORDER — OXYCODONE HCL 10 MG PO TABS: 10.0000 mg | ORAL_TABLET | Freq: Four times a day (QID) | ORAL | 0 refills | Status: AC | PRN

## 2019-09-14 ENCOUNTER — Telehealth: Payer: Self-pay | Admitting: *Deleted

## 2019-09-14 NOTE — Telephone Encounter (Signed)
Pt calls concerned about med, [facility] 10 is out of stock at all area [facility], if not delivered by tomorrow will need to choose new pharm and will need new script sent since controls cannot be transferred

## 2019-09-14 NOTE — Telephone Encounter (Signed)
Ok. Just have her let us know if it doesn't get delivered and I will send in a new script to whichever pharmacy she would like.

## 2019-09-15 ENCOUNTER — Other Ambulatory Visit: Payer: Self-pay | Admitting: Internal Medicine

## 2019-09-15 NOTE — Telephone Encounter (Signed)
[facility] pharmacist Colletta Maryland has called back to report that the  Location at [place] is now out of the requested medication and to please sen request to the new locations of   [facility] Address: [place], [place], [place] [address] Phone: [phone]

## 2019-09-15 NOTE — Telephone Encounter (Signed)
Refill Request  Oxycodone HCl 10 MG TABS  [id number] - [place], [place]   Rec'd call from Pharmacist Stephanie at the [facility] location listed above stating that they are currently out of Oxycodone medication and is requesting this prescription  Be sent to the below location  [facility]  Address: [address] [place], [place], [place] [address]  Phone: [phone]

## 2019-09-16 ENCOUNTER — Telehealth: Payer: Self-pay | Admitting: *Deleted

## 2019-09-16 ENCOUNTER — Other Ambulatory Visit: Payer: Self-pay | Admitting: *Deleted

## 2019-09-16 MED ORDER — OXYCODONE HCL 10 MG PO TABS: 10.0000 mg | ORAL_TABLET | Freq: Four times a day (QID) | ORAL | 0 refills | Status: DC | PRN

## 2019-09-16 NOTE — Telephone Encounter (Signed)
Call from pt - stated [facility] [address] [facility]. Please send new rx. Thanmks

## 2019-09-16 NOTE — Telephone Encounter (Signed)
Her prescription for this month has been sent to her requested pharmacy in [place]. She will need to pick it up there this month. Future prescriptions will be sent to [place]

## 2019-09-16 NOTE — Telephone Encounter (Signed)
error 

## 2019-09-16 NOTE — Telephone Encounter (Signed)
Pt stated [facility] just called her but rx written for qty of 20 not 120. Thanks

## 2019-09-16 NOTE — Telephone Encounter (Signed)
Pt called / informed [facility] [address]

## 2019-09-16 NOTE — Telephone Encounter (Signed)
Ok, tell her I apologize for the mix up and I [facility] the [facility] [address]. If there are still issues, I can call her and figure it out.

## 2019-09-30 DIAGNOSIS — M67911 Unspecified disorder of synovium and tendon, right shoulder: Secondary | ICD-10-CM | POA: Insufficient documentation

## 2019-10-06 ENCOUNTER — Telehealth: Payer: Self-pay | Admitting: *Deleted

## 2019-10-06 NOTE — Telephone Encounter (Addendum)
Information was sent to [employer] for PA for Jardiance.  Awaiting determination from [employer].  Sander Nephew, RN 10/06/2019 9:39 AM. PA for Jardiance was approved 10/06/2019 through 10/04/2020.  Sander Nephew, RN 10/06/2019 11:09 AM.

## 2019-10-12 ENCOUNTER — Other Ambulatory Visit: Payer: Self-pay | Admitting: *Deleted

## 2019-10-12 MED ORDER — OXYCODONE HCL 10 MG PO TABS: 10.0000 mg | ORAL_TABLET | Freq: Four times a day (QID) | ORAL | 0 refills | Status: AC | PRN

## 2019-10-19 ENCOUNTER — Telehealth: Payer: Self-pay

## 2019-10-19 NOTE — Telephone Encounter (Signed)
Needs to speak with a nurse about PT. Please call pt back.

## 2019-10-19 NOTE — Telephone Encounter (Signed)
liraglutide (VICTOZA) 18 MG/3ML SOPN, Need PA. Please call pt back.

## 2019-10-21 NOTE — Telephone Encounter (Signed)
Returned call to patient. Wellsite geologist will not do knee replacement unless she can find someone who will do PT afterward. Patient was inquiring about the orange card. Patient has Medicaid currently and will not qualify for orange card. There are no resources for PT for patients with Medicaid. Advised patient to contact surgeon's office for potential Rehab following knee surgery. Patient is agreeable. Hubbard Hartshorn, BSN, RN-BC

## 2019-10-22 ENCOUNTER — Telehealth: Payer: Self-pay | Admitting: *Deleted

## 2019-10-22 NOTE — Telephone Encounter (Addendum)
Information was sent to [employer] for PA for Victoza.  Awaiting determination .  Conf # [id number].  Sander Nephew, RN 10/22/2019 2:31 PM.   PA  For Victoza was approved 10/22/2019 thru 4/0/2022.  Sander Nephew, RN 10/29/2019 3:25 PM.

## 2019-10-26 ENCOUNTER — Other Ambulatory Visit: Payer: Self-pay | Admitting: Internal Medicine

## 2019-10-26 DIAGNOSIS — G2581 Restless legs syndrome: Secondary | ICD-10-CM

## 2019-10-26 DIAGNOSIS — G8929 Other chronic pain: Secondary | ICD-10-CM

## 2019-10-26 DIAGNOSIS — M5442 Lumbago with sciatica, left side: Secondary | ICD-10-CM

## 2019-10-26 NOTE — Telephone Encounter (Signed)
Refill Request- Pt calling to notify the office she will need a Prior Authorization to get the following medications refilled  cyclobenzaprine (FLEXERIL) 10 MG tablet rOPINIRole (REQUIP) 0.5 MG tablet  [id number] - [place], [place]

## 2019-10-26 NOTE — Telephone Encounter (Signed)
Pt calling to f/u on her Prior Authorization for the following medication.  liraglutide (VICTOZA) 18 MG/3ML SOPN   [facility] [place], [place]

## 2019-10-27 ENCOUNTER — Telehealth: Payer: Self-pay | Admitting: *Deleted

## 2019-10-27 ENCOUNTER — Encounter: Payer: Self-pay | Admitting: Internal Medicine

## 2019-10-27 ENCOUNTER — Ambulatory Visit (INDEPENDENT_AMBULATORY_CARE_PROVIDER_SITE_OTHER): Payer: Medicaid Other | Admitting: Internal Medicine

## 2019-10-27 VITALS — BP 164/82 | HR 78 | Temp 98.0°F | Ht 60.0 in | Wt 198.2 lb

## 2019-10-27 DIAGNOSIS — R519 Headache, unspecified: Secondary | ICD-10-CM | POA: Diagnosis not present

## 2019-10-27 DIAGNOSIS — R42 Dizziness and giddiness: Secondary | ICD-10-CM | POA: Diagnosis present

## 2019-10-27 DIAGNOSIS — R110 Nausea: Secondary | ICD-10-CM

## 2019-10-27 MED ORDER — MECLIZINE HCL 12.5 MG PO TABS: 12.5000 mg | ORAL_TABLET | Freq: Three times a day (TID) | ORAL | 0 refills | Status: DC | PRN

## 2019-10-27 NOTE — Telephone Encounter (Signed)
Call to [employer].  Cyclobenzaprine and Ropinirole are preferred medications and do not require a PA.  All  that is needed are the refill prescriptions.

## 2019-10-27 NOTE — Telephone Encounter (Signed)
Pt did not have surgery today. She calls and states her BP is elevated and she is dizzy, she has a wrist monitor and states the reading now is 153/91. Her spouse will drive her to clinic, [place] at 1445.

## 2019-10-27 NOTE — Assessment & Plan Note (Signed)
Pt presents for two episodes of dizziness earlier today. 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 Upon sliding out and sitting up, pt experienced 2-3 minutes of dizziness which she described as a room-spinning sensation. Denies chest pain, palpitations, focal weakness, or loss of consciousness. She checked her blood sugar and BP after this episode stating her CBG was 120 and BP 153/91. Pt felt back to baseline and resumed working on the car an hour or so later. A similar episode occurred where pt had a dizzy spell after sitting up. BP this time was 144/82. Pt does endorse slight headache after the second episode. Of note, pt states she was in [place] over the weekend and felt congested upon returning home. Her niece was sick with some vomiting episodes after the trip, and pt wonders if she herself could be getting ill.   In the office, pt states she is feeling improved and denies dizziness currently. Orthostatic vital signs positive with SBP drop from 164 to 137, though BP never hypotensive. Pt did feel unsteady when standing from seated position. Denies history of vertigo or similar symptoms. Is on oxycodone and flexeril but pt had not taken either since evening prior to these episodes (>10hr). No focal neurologic deficits on exam. Normal coordination on finger to nose testing bilaterally. Pt able to stand up and ambulate unassisted with normal gait. Pt did have left beating nystagmus on EOM testing.    Assessment - episodic dizziness, now resolved - suspect BPPV, ?possibly triggered by viral infection.  - provided education and reassurance regarding benign and episodic nature of vertigo - instructed pt on how to perform Epley maneuver at home and gave written instructions on AVS - prescribed meclizine for symptomatic management - pt to follow-up if symptoms fail to improve, could consider vestibular rehab at that time  Orthostatic VS for the past 24  hrs:  BP- Lying Pulse- Lying BP- Sitting Pulse- Sitting BP- Standing at 0 minutes Pulse- Standing at 0 minutes  10/27/19 1427 164/82 78 157/82 78 137/77 80

## 2019-10-27 NOTE — Patient Instructions (Addendum)
[name],  It was nice meeting you today! I am sorry you are having trouble with dizziness. I believe your two episodes of dizziness were caused by vertigo, or an imbalance in your inner ear. Please perform the Epley maneuver at home to treat the underlying cause of your dizziness. You can also take meclizine as needed for the dizziness/nausea, and a prescription for this has been sent into the [facility] pharmacy.  Please let our office know if your symptoms worsen or fail to improve with these treatments.  Thank you for letting me be a part of your care!    Benign Positional Vertigo Vertigo is the feeling that you or your surroundings are moving when they are not. Benign positional vertigo is the most common form of vertigo. This is usually a harmless condition (benign). This condition is positional. This means that symptoms are triggered by certain movements and positions. This condition can be dangerous if it occurs while you are doing something that could cause harm to you or others. This includes activities such as driving or operating machinery. What are the causes? In many cases, the cause of this condition is not known. It may be caused by a disturbance in an area of the inner ear that helps your brain to sense movement and balance. This disturbance can be caused by:  Viral infection (labyrinthitis).  Head injury.  Repetitive motion, such as jumping, dancing, or running. What increases the risk? You are more likely to develop this condition if:  You are a woman.  You are 30 years of age or older. What are the signs or symptoms? Symptoms of this condition usually happen when you move your head or your eyes in different directions. Symptoms may start suddenly, and usually last for less than a minute. They include:  Loss of balance and falling.  Feeling like you are spinning or moving.  Feeling like your surroundings are spinning or moving.  Nausea and  vomiting.  Blurred vision.  Dizziness.  Involuntary eye movement (nystagmus). Symptoms can be mild and cause only minor problems, or they can be severe and interfere with daily life. Episodes of benign positional vertigo may return (recur) over time. Symptoms may improve over time. How is this diagnosed? This condition may be diagnosed based on:  Your medical history.  Physical exam of the head, neck, and ears.  Tests, such as: ? MRI. ? CT scan. ? Eye movement tests. Your health care provider may ask you to change positions quickly while he or she watches you for symptoms of benign positional vertigo, such as nystagmus. Eye movement may be tested with a variety of exams that are designed to evaluate or stimulate vertigo. ? An electroencephalogram (EEG). This records electrical activity in your brain. ? Hearing tests. You may be referred to a health care provider who specializes in ear, nose, and throat (ENT) problems (otolaryngologist) or a provider who specializes in disorders of the nervous system (neurologist). How is this treated?  This condition may be treated in a session in which your health care provider moves your head in specific positions to adjust your inner ear back to normal. Treatment for this condition may take several sessions. Surgery may be needed in severe cases, but this is rare. In some cases, benign positional vertigo may resolve on its own in 2-4 weeks. Follow these instructions at home: Safety  Move slowly. Avoid sudden body or head movements or certain positions, as told by your health care provider.  Avoid  driving until your health care provider says it is safe for you to do so.  Avoid operating heavy machinery until your health care provider says it is safe for you to do so.  Avoid doing any tasks that would be dangerous to you or others if vertigo occurs.  If you have trouble walking or keeping your balance, try using a cane for stability. If you  feel dizzy or unstable, sit down right away.  Return to your normal activities as told by your health care provider. Ask your health care provider what activities are safe for you. General instructions  Take over-the-counter and prescription medicines only as told by your health care provider.  Drink enough fluid to keep your urine pale yellow.  Keep all follow-up visits as told by your health care provider. This is important. Contact a health care provider if:  You have a fever.  Your condition gets worse or you develop new symptoms.  Your family or friends notice any behavioral changes.  You have nausea or vomiting that gets worse.  You have numbness or a "pins and needles" sensation. Get help right away if you:  Have difficulty speaking or moving.  Are always dizzy.  Faint.  Develop severe headaches.  Have weakness in your legs or arms.  Have changes in your hearing or vision.  Develop a stiff neck.  Develop sensitivity to light. Summary  Vertigo is the feeling that you or your surroundings are moving when they are not. Benign positional vertigo is the most common form of vertigo.  The cause of this condition is not known. It may be caused by a disturbance in an area of the inner ear that helps your brain to sense movement and balance.  Symptoms include loss of balance and falling, feeling that you or your surroundings are moving, nausea and vomiting, and blurred vision.  This condition can be diagnosed based on symptoms, physical exam, and other tests, such as MRI, CT scan, eye movement tests, and hearing tests.  Follow safety instructions as told by your health care provider. You will also be told when to contact your health care provider in case of problems. This information is not intended to replace advice given to you by your health care provider. Make sure you discuss any questions you have with your health care provider. Document Revised: 12/11/2017  Document Reviewed: 12/11/2017 Elsevier Patient Education  [place].   How to Perform the Epley Maneuver The Epley maneuver is an exercise that relieves symptoms of vertigo. Vertigo is the feeling that you or your surroundings are moving when they are not. When you feel vertigo, you may feel like the room is spinning and have trouble walking. Dizziness is a little different than vertigo. When you are dizzy, you may feel unsteady or light-headed. You can do this maneuver at home whenever you have symptoms of vertigo. You can do it up to 3 times a day until your symptoms go away. Even though the Epley maneuver may relieve your vertigo for a few weeks, it is possible that your symptoms will return. This maneuver relieves vertigo, but it does not relieve dizziness. What are the risks? If it is done correctly, the Epley maneuver is considered safe. Sometimes it can lead to dizziness or nausea that goes away after a short time. If you develop other symptoms, such as changes in vision, weakness, or numbness, stop doing the maneuver and call your health care provider. How to perform the Epley maneuver  1. Sit on the edge of a bed or table with your back straight and your legs extended or hanging over the edge of the bed or table. 2. Turn your head halfway toward the affected ear or side. 3. Lie backward quickly with your head turned until you are lying flat on your back. You may want to position a pillow under your shoulders. 4. Hold this position for 30 seconds. You may experience an attack of vertigo. This is normal. 5. Turn your head to the opposite direction until your unaffected ear is facing the floor. 6. Hold this position for 30 seconds. You may experience an attack of vertigo. This is normal. Hold this position until the vertigo stops. 7. Turn your whole body to the same side as your head. Hold for another 30 seconds. 8. Sit back up. You can repeat this exercise up to 3 times a  day. Follow these instructions at home:  After doing the Epley maneuver, you can return to your normal activities.  Ask your health care provider if there is anything you should do at home to prevent vertigo. He or she may recommend that you: ? Keep your head raised (elevated) with two or more pillows while you sleep. ? Do not sleep on the side of your affected ear. ? Get up slowly from bed. ? Avoid sudden movements during the day. ? Avoid extreme head movement, like looking up or bending over. Contact a health care provider if:  Your vertigo gets worse.  You have other symptoms, including: ? Nausea. ? Vomiting. ? Headache. Get help right away if:  You have vision changes.  You have a severe or worsening headache or neck pain.  You cannot stop vomiting.  You have new numbness or weakness in any part of your body. Summary  Vertigo is the feeling that you or your surroundings are moving when they are not.  The Epley maneuver is an exercise that relieves symptoms of vertigo.  If the Epley maneuver is done correctly, it is considered safe. You can do it up to 3 times a day. This information is not intended to replace advice given to you by your health care provider. Make sure you discuss any questions you have with your health care provider. Document Revised: 06/14/2017 Document Reviewed: 05/22/2016 Elsevier Patient Education  2020 [employer].

## 2019-10-27 NOTE — Progress Notes (Signed)
   CC: dizziness  HPI:  Ms.Jaicee Mcferrin is a 59 y.o. F with significant PMH as outlined below, who presents for dizziness and high BP. Please see problem-based charting   Past Medical History:  Diagnosis Date  . 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 Arthritis   . Diabetes mellitus    oral tx  . GERD (gastroesophageal reflux disease)   . History of post-sterilization tuboplasty 2000  . Plantar fasciitis    Right  . Shortness of breath    with exertion  . Sleep apnea 5 plus yrs   study -pt could not sleep test inconclusive.   . Tear of meniscus of left knee    x2  . Tear of meniscus of right knee    Review of Systems:   Review of Systems  Constitutional: Negative for chills, diaphoresis, fever and malaise/fatigue.  HENT: Positive for congestion. Negative for ear pain, sinus pain and sore throat.   Respiratory: Negative for cough and shortness of breath.   Cardiovascular: Negative for chest pain and palpitations.  Gastrointestinal: Positive for nausea. Negative for abdominal pain and vomiting.  Genitourinary: Negative for dysuria.  Neurological: Positive for dizziness and headaches. Negative for sensory change, speech change, focal weakness, seizures and loss of consciousness.   Physical Exam:  Vitals:   10/27/19 1430  BP: (!) 164/82  Pulse: 78  Temp: 98 F (36.7 C)  TempSrc: Oral  SpO2: 97%  Weight: 198 lb 3.2 oz (89.9 kg)  Height: 5' (1.524 m)   Physical Exam Vitals and nursing note reviewed.  Constitutional:      General: She is not in acute distress.    Appearance: Normal appearance. She is not ill-appearing.  HENT:     Right Ear: Tympanic membrane and ear canal normal.     Left Ear: Tympanic membrane and ear canal normal.     Nose: Nose normal.     Mouth/Throat:     Mouth: Mucous membranes are moist.     Pharynx: Oropharynx is clear.  Eyes:     Extraocular Movements: Extraocular movements intact.    Comments: Left beating nystagmus on EOM testing.  Neurological:     General: No focal deficit present.     Mental Status: She is alert.     Cranial Nerves: Cranial nerves are intact.     Sensory: Sensation is intact.     Motor: Motor function is intact.     Coordination: Coordination is intact. Coordination normal. Finger-Nose-Finger Test and Heel to [facility] Test normal.     Gait: Gait is intact.    Assessment & Plan:   See Encounters Tab for problem based charting.  Patient discussed with Dr. Rebeca Alert

## 2019-10-27 NOTE — Telephone Encounter (Signed)
[name], can you let me know when the prior auth is good and I can send scripts in?

## 2019-10-28 MED ORDER — CYCLOBENZAPRINE HCL 10 MG PO TABS: ORAL_TABLET | ORAL | 1 refills | Status: DC

## 2019-10-28 MED ORDER — ROPINIROLE HCL 0.5 MG PO TABS: ORAL_TABLET | ORAL | 0 refills | Status: DC

## 2019-10-28 NOTE — Telephone Encounter (Signed)
Received TC from patient who is upset because her medication is not at the pharmacy.  She states "I am tired of going back and forth to the pharmacy looking for medication, I am always at the pharmacy.  This has been going on for 3 years.  I've called and requested this 3 days ago.  I want to speak to someone about changing doctors." SChaplin, RN,BSN

## 2019-10-28 NOTE — Progress Notes (Signed)
Internal Medicine Clinic Attending  Case discussed with Dr. Jones at the time of the visit.  We reviewed the resident's history and exam and pertinent patient test results.  I agree with the assessment, diagnosis, and plan of care documented in the resident's note.  Jerelle Virden, M.D., Ph.D.  

## 2019-10-29 NOTE — Telephone Encounter (Signed)
She can certainly establish with a provider of her choice if she feels she would be better served. Please confirm that she is no longer interested in following with our clinic to ensure we are not refilling controlled meds if we are no longer PCP.  Pass along my best wishes.  I have added Dr.Butcher to this conversation in case there are any further clinic policies are required to take place.   Thanks!

## 2019-11-02 NOTE — Telephone Encounter (Signed)
Thank you Nothing further other than clarifying her intent

## 2019-11-03 NOTE — Telephone Encounter (Signed)
Can we confirm that she is firing [name]? --Thanks!

## 2019-11-04 NOTE — Telephone Encounter (Signed)
Dr. Darrick Meigs, I will forward this to our office manager, [place].  I have asked her to reach out to the patient.  The patient had wanted to speak with her regarding changing doctors. Thank you, SChaplin, RN,BSN

## 2019-11-05 ENCOUNTER — Ambulatory Visit: Payer: Medicaid Other | Admitting: Internal Medicine

## 2019-11-05 ENCOUNTER — Encounter: Payer: Self-pay | Admitting: Internal Medicine

## 2019-11-05 ENCOUNTER — Other Ambulatory Visit: Payer: Self-pay

## 2019-11-05 VITALS — BP 140/77 | HR 76 | Temp 98.0°F | Ht 60.0 in | Wt 199.2 lb

## 2019-11-05 DIAGNOSIS — R42 Dizziness and giddiness: Secondary | ICD-10-CM

## 2019-11-05 DIAGNOSIS — Z794 Long term (current) use of insulin: Secondary | ICD-10-CM | POA: Diagnosis not present

## 2019-11-05 DIAGNOSIS — E113393 Type 2 diabetes mellitus with moderate nonproliferative diabetic retinopathy without macular edema, bilateral: Secondary | ICD-10-CM

## 2019-11-05 DIAGNOSIS — E11319 Type 2 diabetes mellitus with unspecified diabetic retinopathy without macular edema: Secondary | ICD-10-CM

## 2019-11-05 MED ORDER — TRIAMCINOLONE ACETONIDE 0.1 % EX CREA: 1.0000 "application " | TOPICAL_CREAM | Freq: Two times a day (BID) | CUTANEOUS | 0 refills | Status: DC

## 2019-11-05 NOTE — Patient Instructions (Addendum)
[name],  It was nice seeing you again today! I am sorry you are continuing to have dizzy spells.  These episodes could be related to vertigo symptoms, drops in your blood sugars, a recent viral illness, or medication side effects.  Please decrease you Lantus to 30 units per day. Check you blood sugar four times per day. Follow-up in our clinic in 1 week and bring your meter or sugar readings written down to that follow-up appointment.  Lets stop the meclizine medicine for one week, as it can be sedating. Continue doing the Epley exercises at home.   Schedule an appointment for next Thursday or Friday and we can re-evaluate how you are doing.  Thank you for letting me be a part of your care!     Labyrinthitis  Labyrinthitis is an inner ear infection. The inner ear is a system of tubes and canals (labyrinth) that are filled with fluid. The inner ear also contains nerve cells that send hearing and balance signals to the brain. When tiny germs (microorganisms) get inside the labyrinth, they harm the cells that send messages to the brain. This can cause changes in hearing and balance. Labyrinthitis usually develops suddenly and goes away with treatment in a few weeks (acute labyrinthitis). If the infection damages parts of the labyrinth, some symptoms may last for a long time (chronic labyrinthitis). What are the causes? Labyrinthitis is most often caused by a virus, such as one that causes:  Infectious mononucleosis, also called "mono."  Measles.  The flu.  Herpes. Labyrinthitis can also be caused by bacteria that spread from an infection in the brain or the middle ear (suppurative labyrinthitis). In some cases, the bacteria may produce a poison (toxin) that gets inside the labyrinth (serous labyrinthitis). What increases the risk? You may be at greater risk for labyrinthitis if you:  Recently had a mouth, nose, or throat infection (upper respiratory infection) or an ear  infection.  Drink a lot of alcohol.  Smoke.  Use certain drugs.  Are not well-rested (are fatigued).  Are experiencing a lot of stress.  Have allergies. What are the signs or symptoms? Symptoms of labyrinthitis usually start suddenly. The symptoms may range from mild to severe, and may include:  Dizziness.  Hearing loss.  A feeling that you or your surroundings are moving when they are not (vertigo).  Ringing in your ear (tinnitus).  Nausea and vomiting.  Trouble focusing your eyes. Symptoms of chronic labyrinthitis may include:  Fatigue.  Confusion.  Hearing loss.  Tinnitus.  Poor balance.  Vertigo after sudden head movements. How is this diagnosed? This condition may be diagnosed based on:  Your symptoms and medical history. Your health care provider may ask about any dizziness or hearing loss you have and any recent upper respiratory infections.  A physical exam that involves: ? Checking your ears for infection. ? Testing your balance. ? Checking your eye movement.  Hearing tests.  Imaging tests, such as CT scan or MRI.  Tests of your eye movements (electronystagmogram, ENG). How is this treated?  Treatment depends on the cause. If your condition is caused by bacteria, you may need antibiotic medicine. If it is caused by a virus, it may get better on its own. Regardless of the cause, you may be treated with:  Medicines to: ? Stop dizziness. ? Relieve nausea. ? Reduce inflammation. ? Speed up your recovery.  Rest. You may be asked to limit your activities until dizziness goes away.  IV fluids. These  may be given at a hospital. You may need IV fluids if you have severe nausea and vomiting.  Physical therapy. A therapist can teach you exercises to help you adjust to feeling dizzy (vestibular rehabilitation exercises). You may need this if you have dizziness that does not go away. Follow these instructions at home: Medicines  Take  over-the-counter and prescription medicines only as told by your health care provider.  If you were prescribed an antibiotic medicine, take it exactly as told by your health care provider. Do not stop taking the antibiotic even if you start to feel better. Activity  Rest as much as possible.  Limit your activity as directed. Ask your health care provider what activities are safe for you.  Do not make sudden movements until any dizziness goes away.  If physical therapy was prescribed, do exercises as directed. General instructions  Avoid loud noises and bright lights.  Do not drive until your health care provider says that this is safe for you.  Drink enough fluid to keep your urine pale yellow.  Keep all follow-up visits as told by your health care provider. This is important. Contact a health care provider if you have:  Symptoms that do not get better with medicine.  Symptoms that last longer than two weeks.  A fever. Get help right away if you have:  Nausea or vomiting that is severe or does not go away.  Severe dizziness.  Sudden hearing loss. Summary  Labyrinthitis is an infection of the inner ear. It can cause changes in hearing and balance.  Symptoms usually start suddenly and include dizziness, hearing loss, and nausea and vomiting. You may also have ringing in your ear (tinnitus), trouble focusing your eyes, and a feeling that you or your surroundings are moving when they are not (vertigo).  If the condition lasts more than a few weeks, symptoms may include fatigue, confusion, hearing loss, poor balance, tinnitus, and vertigo.  Treatment depends on the cause. If your labyrinthitis is caused by bacteria, you may need antibiotic medicine. If your labyrinthitis is caused by a virus, it may get better on its own.  Follow your health care provider's instructions, including how to take medicines, what activities to avoid, and when to get medical help. This information  is not intended to replace advice given to you by your health care provider. Make sure you discuss any questions you have with your health care provider. Document Revised: 04/21/2018 Document Reviewed: 07/13/2017 Elsevier Patient Education  2020 [employer].

## 2019-11-05 NOTE — Progress Notes (Signed)
   CC: continued dizziness  HPI:  [name] is a 59 y.o. F with significant PMH as outlined below, who presents for persistent intermittent dizziness. Please see problem-based charting for additional information.  Past Medical History:  Diagnosis Date  . 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 Arthritis   . Diabetes mellitus    oral tx  . GERD (gastroesophageal reflux disease)   . History of post-sterilization tuboplasty 2000  . Plantar fasciitis    Right  . Shortness of breath    with exertion  . Sleep apnea 5 plus yrs   study -pt could not sleep test inconclusive.   . Tear of meniscus of left knee    x2  . Tear of meniscus of right knee    Review of Systems:   Review of Systems  Constitutional: Negative for chills and fever.  HENT: Negative for ear pain, hearing loss, sinus pain and tinnitus.   Respiratory: Negative for cough and shortness of breath.   Cardiovascular: Negative for chest pain and palpitations.  Gastrointestinal: Negative for abdominal pain, constipation, diarrhea, nausea and vomiting.  Genitourinary: Negative for dysuria and frequency.  Neurological: Positive for dizziness and headaches. Negative for tingling, tremors, sensory change, focal weakness, seizures, loss of consciousness and weakness.  Endo/Heme/Allergies:       Hypoglycemia   Physical Exam:  Vitals:   11/05/19 1025  BP: 140/77  Pulse: 76  Temp: 98 F (36.7 C)  TempSrc: Oral  SpO2: 99%  Weight: 199 lb 3.2 oz (90.4 kg)  Height: 5' (1.524 m)   Physical Exam Vitals and nursing note reviewed.  Constitutional:      General: She is not in acute distress.    Appearance: She is not ill-appearing.  HENT:     Mouth/Throat:     Mouth: Mucous membranes are moist.  Eyes:     Extraocular Movements: Extraocular movements intact.     Comments: Left beating nystagmus.  Cardiovascular:     Rate and Rhythm: Normal rate and regular  rhythm.     Heart sounds: Normal heart sounds.  Pulmonary:     Effort: Pulmonary effort is normal.  Abdominal:     General: Abdomen is flat. Bowel sounds are normal. There is no distension.     Palpations: Abdomen is soft.     Tenderness: There is no abdominal tenderness.  Skin:    General: Skin is warm and dry.  Neurological:     General: No focal deficit present.     Mental Status: She is alert and oriented to person, place, and time.    Assessment & Plan:   See Encounters Tab for problem based charting.  Patient discussed with Dr. Dareen Piano

## 2019-11-10 ENCOUNTER — Other Ambulatory Visit: Payer: Self-pay

## 2019-11-10 DIAGNOSIS — R21 Rash and other nonspecific skin eruption: Secondary | ICD-10-CM

## 2019-11-10 MED ORDER — CETIRIZINE HCL 10 MG PO TABS: 10.0000 mg | ORAL_TABLET | Freq: Every day | ORAL | 3 refills | Status: DC

## 2019-11-10 NOTE — Telephone Encounter (Signed)
Per pharmacy-rx for oxycodone already on file, but not due for refill until 5/2, pt also has rx for meloxicam on file.  Will send request for cetirizine to MD for consideration. Pt aware.[name]4/27/202112:05 PM

## 2019-11-10 NOTE — Telephone Encounter (Signed)
cetirizine (ZYRTEC) 10 MG tablet requesting 90 days supply  meloxicam (MOBIC) 15 MG tablet, requesting 90 days supply.   oxyCODONE 10 MG TABS   Refill request @  [facility] [phone] - [name], [address] [phone] (Phone) [phone] (Fax)

## 2019-11-12 ENCOUNTER — Ambulatory Visit: Payer: Medicaid Other

## 2019-11-12 ENCOUNTER — Other Ambulatory Visit: Payer: Self-pay | Admitting: *Deleted

## 2019-11-12 ENCOUNTER — Encounter: Payer: Self-pay | Admitting: Internal Medicine

## 2019-11-12 MED ORDER — MELOXICAM 15 MG PO TABS: 15.0000 mg | ORAL_TABLET | Freq: Every day | ORAL | 1 refills | Status: DC

## 2019-11-12 NOTE — Telephone Encounter (Signed)
Pt has an appt today in [place].

## 2019-11-21 NOTE — Assessment & Plan Note (Addendum)
Pt endorse two episodes of overnight hypoglycemia to 38 and 52. She takes lantus 35u qhs. Has been holding her mealtime sliding scale for last two weeks. Also takes Jardiance 10mg  daily. Unfortunately, does not have her meter with her today. Last A1c in 08/2019 was 7.8. Denies polyuria, polydipsia, nausea/abdominal pain, diaphoresis, or fatigue. Has been having dizziness and episodes of imbalance, though unclear at this time if they are hypoglycemic events. She is unable to wear an all day CBG monitor due to her active job has a [employer] and frequent sweating.   - will decrease lantus to 30u daily - continue to hold novolog - pt to check sugars 4 times per day - will follow-up to go over CBGs in 1 week - needs repeat A1c next month

## 2019-11-21 NOTE — Assessment & Plan Note (Signed)
Pt presents for continued dizzy spells. Was evaluated 10 days again for two episodes of isolated vertigo. She was instructed on how to perform Epley maneuvers at home and given PRN meclizine. Today, she states her dizzy spells have worsened in frequency. Can have up to 4-5 episodes in a day, all lasting 3-6 minutes. She has gone some days without any dizziness. Endorsing associated frontal headaches. Has been doing Epley at home once a day for the last week. Pt endorses meclizine helps resolve her symptoms. At this time, pt describes the episodes as lightheadedness and off balance feeling. Denies room spinning or visual changes. No diaphoresis, palpitations, chest pain, weakness, loss of consciousness, or sensory changes. Of note, pt has had two episodes of overnight hypoglycemia over the last 2 weeks. During these pt awoke from sleep and didn't feel herself. Upon checking her sugars they were 38 and 52. She takes 35 units lantus daily. Additionally, pt states that since [place] the [place] 3 [place], all of her relatives have gotten sick with diarrheal illnesses except for her. She wondered if one day she was having a fever, but has otherwise felt at her baseline in between her dizzy spells.  Examination unchanged from prior at last appointment. Continues to have left beating nystagmus on EOM testing consistent with peripheral source. No focal neuro deficits otherwise. Discussed broad differential for pt's dizzy/off balance feeling, including hypoglycemia, vertigo/BPPV, sedating side effects from meclizine use, or labyrinthitis from recent viral infection.  - will decrease lantus to 30u daily - instruct pt to continue to check blood sugars at home four times a day and when episodes occur - hold meclizine for 1 week - continue Epley at home - unable to refer to vestibular rehab given pt used her 1 Medicaid covered PT session this year for her knee OA - clinic follow-up in 1 week to monitor  symptoms

## 2019-11-23 NOTE — Progress Notes (Signed)
Internal Medicine Clinic Attending  Case discussed with Dr. Jones at the time of the visit.  We reviewed the resident's history and exam and pertinent patient test results.  I agree with the assessment, diagnosis, and plan of care documented in the resident's note.  

## 2019-12-04 ENCOUNTER — Other Ambulatory Visit: Payer: Self-pay | Admitting: *Deleted

## 2019-12-24 ENCOUNTER — Other Ambulatory Visit: Payer: Self-pay | Admitting: *Deleted

## 2019-12-24 DIAGNOSIS — G8929 Other chronic pain: Secondary | ICD-10-CM

## 2019-12-29 MED ORDER — CYCLOBENZAPRINE HCL 10 MG PO TABS: ORAL_TABLET | ORAL | 5 refills | Status: DC

## 2019-12-31 ENCOUNTER — Other Ambulatory Visit: Payer: Self-pay | Admitting: *Deleted

## 2019-12-31 DIAGNOSIS — G2581 Restless legs syndrome: Secondary | ICD-10-CM

## 2020-01-04 ENCOUNTER — Other Ambulatory Visit: Payer: Self-pay | Admitting: Internal Medicine

## 2020-01-04 DIAGNOSIS — E785 Hyperlipidemia, unspecified: Secondary | ICD-10-CM

## 2020-01-04 MED ORDER — ATORVASTATIN CALCIUM 20 MG PO TABS: 20.0000 mg | ORAL_TABLET | Freq: Every day | ORAL | 1 refills | Status: DC

## 2020-01-04 MED ORDER — ROPINIROLE HCL 0.5 MG PO TABS: ORAL_TABLET | ORAL | 1 refills | Status: DC

## 2020-01-04 NOTE — Telephone Encounter (Signed)
Need refill on rOPINIRole (REQUIP) 0.5 MG tablet atorvastatin (LIPITOR) 20 MG tablet ;pt contact [phone]    [id number] - , [address]

## 2020-01-05 ENCOUNTER — Telehealth: Payer: Self-pay | Admitting: Internal Medicine

## 2020-01-05 NOTE — Telephone Encounter (Signed)
Pt needs prior authorization rOPINIRole (REQUIP) 0.5 MG tablet  ;pt contact [phone]

## 2020-01-06 NOTE — Telephone Encounter (Signed)
Patient's request for early refill. Medication not due for refill until July.

## 2020-01-14 ENCOUNTER — Other Ambulatory Visit: Payer: Self-pay | Admitting: Internal Medicine

## 2020-01-14 NOTE — Telephone Encounter (Signed)
Pls contact pt regarding pain medicine, the pharmacy that she use they are complete out [phone]

## 2020-01-14 NOTE — Telephone Encounter (Signed)
Received TC from patient, she is requesting a refill on oxycodone 10mg  (which has fallen off of her med list as expired/ last sent 4/24).  Requesting RX at [facility]. Last UDS 05/08/19 No future appt's. SChaplin, RN,BSN

## 2020-01-15 MED ORDER — OXYCODONE HCL 10 MG PO TABS: 10.0000 mg | ORAL_TABLET | Freq: Four times a day (QID) | ORAL | 0 refills | Status: DC | PRN

## 2020-01-19 ENCOUNTER — Other Ambulatory Visit: Payer: Self-pay | Admitting: *Deleted

## 2020-01-19 ENCOUNTER — Telehealth: Payer: Self-pay | Admitting: Internal Medicine

## 2020-01-19 DIAGNOSIS — Z794 Long term (current) use of insulin: Secondary | ICD-10-CM

## 2020-01-19 DIAGNOSIS — E119 Type 2 diabetes mellitus without complications: Secondary | ICD-10-CM

## 2020-01-19 MED ORDER — ACCU-CHEK GUIDE VI STRP: ORAL_STRIP | 12 refills | Status: DC

## 2020-01-19 MED ORDER — PEN NEEDLES 32G X 4 MM MISC: 1.0000 | Freq: Every day | 1 refills | Status: DC

## 2020-01-19 NOTE — Telephone Encounter (Signed)
PA request and ov notes faxed to [place] (medicaid)-turnaround time 24hrs.[name]7/6/20214:43 PM

## 2020-01-19 NOTE — Telephone Encounter (Signed)
Pls contact pt regarding prior authorization medicine [phone]

## 2020-01-19 NOTE — Telephone Encounter (Signed)
Please have pt arrange an appt for dm recheck.  Voltaren is now over the counter so prescriptions can not be sent. Thanks, Rafik Koppel

## 2020-01-19 NOTE — Telephone Encounter (Signed)
Also received faxed request for Voltaren gel but am unable to reorder as is in Epic. Hubbard Hartshorn, BSN, RN-BC

## 2020-01-20 NOTE — Telephone Encounter (Signed)
Call me to pharmacy to run script for oxycodone-medication was still showing "5 day supply limit".  CMA will contact medicaid for further assistance.Marisa Hidden Cassady7/7/20219:57 AM   Call made to [facility]) to check on the status of PA- CMA was informed that pt's plan had changed to "Healthy Blue" and PA submitted yesterday does not work. PA request will have to go through online via Cover My Meds or on the telephone.  Call made to [facility] at [phone] to initiate PA-Request was sent for "review"-CMA also faxed ov notes to [phone] at their request.  Turnaround time another 24 hours.[name], Marisa Suazo Cassady7/7/202111:04 AM

## 2020-01-20 NOTE — Telephone Encounter (Signed)
See where PA started on oxy [facility], does not need PA on requip Pt prefers prescription voltaren, sent request for refill States name brand OTC does not work as well Psychologist, counselling

## 2020-01-21 ENCOUNTER — Encounter: Payer: Medicaid Other | Admitting: Internal Medicine

## 2020-01-21 NOTE — Telephone Encounter (Signed)
Unfortunately, Voltaren is no longer a prescription medication and can not be prescribed as far as I am aware. I did try to enter an order for it into epic however was unable to find one.

## 2020-01-22 ENCOUNTER — Ambulatory Visit: Payer: Medicaid Other | Admitting: Internal Medicine

## 2020-01-22 ENCOUNTER — Other Ambulatory Visit: Payer: Self-pay

## 2020-01-22 ENCOUNTER — Encounter: Payer: Self-pay | Admitting: Internal Medicine

## 2020-01-22 VITALS — BP 143/68 | HR 78 | Temp 98.1°F | Ht 61.0 in | Wt 199.9 lb

## 2020-01-22 DIAGNOSIS — E113393 Type 2 diabetes mellitus with moderate nonproliferative diabetic retinopathy without macular edema, bilateral: Secondary | ICD-10-CM

## 2020-01-22 DIAGNOSIS — Z79891 Long term (current) use of opiate analgesic: Secondary | ICD-10-CM | POA: Diagnosis not present

## 2020-01-22 DIAGNOSIS — E11319 Type 2 diabetes mellitus with unspecified diabetic retinopathy without macular edema: Secondary | ICD-10-CM | POA: Diagnosis not present

## 2020-01-22 DIAGNOSIS — Z794 Long term (current) use of insulin: Secondary | ICD-10-CM

## 2020-01-22 LAB — POCT GLYCOSYLATED HEMOGLOBIN (HGB A1C): Hemoglobin A1C: 6.8 % — AB (ref 4.0–5.6)

## 2020-01-22 LAB — GLUCOSE, CAPILLARY: Glucose-Capillary: 152 mg/dL — ABNORMAL HIGH (ref 70–99)

## 2020-01-22 MED ORDER — DICLOFENAC SODIUM 3 % EX GEL: 2.0000 g | Freq: Four times a day (QID) | CUTANEOUS | 3 refills | Status: DC

## 2020-01-22 NOTE — Patient Instructions (Signed)
[name], It was nice meeting you!   Today we discussed your pain. I am hopeful the prior auth issues will get resolved by the next time you need it.   Diabetes: we will repeat an A1c today. We will not make any medication changes since you have done well on this regimen when you are able to get everything filled.   Take care, and we'll have you follow-up with your PCP in 2 months

## 2020-01-22 NOTE — Assessment & Plan Note (Signed)
atient recently switched insurance plans and was not able to get PA approved for Oxycodone 10 mg qid prn which she has been on for several years to treat chronic pain related to OA in multiple joints, as well as a a herniated lumbar disc. The opioids keep her pain more manageable so that she can function throughout the day.  - Continue Oxycodone 10 mg QID prn.

## 2020-01-22 NOTE — Telephone Encounter (Signed)
Received faxed form that medication was approved from 01/20/2020-07/18/2020.  Of note, a denial form was received prior to the approval form and pt paid out of pocket for medication yesterday.  Per insurance, pt can be reimbursed.  Pt informed.     [facility] [place] Member# [id number] [phone]

## 2020-01-22 NOTE — Assessment & Plan Note (Signed)
Patient has great glycemic control with A1C of 6.8 today. She has not had any recurrent episodes of symptomatic hypoglycemia since cutting back lantus to 30 units qhs and decreasing use of novolog.  Will not make any changes to medication regimen today. She is hoping to get her left total knee replacement done soon now that her A1C is < 7.

## 2020-01-22 NOTE — Progress Notes (Signed)
Acute Office Visit  Subjective:    Patient ID: [name], female    DOB: [birth year], 59 y.o.   MRN: [id number]  Chief Complaint  Patient presents with  . Follow-up    T2DM  . Medication Management    HPI Patient is in today for follow-up on type II DM. Please see problem based charting for further details.   Past Medical History:  Diagnosis Date  . 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 Arthritis   . Diabetes mellitus    oral tx  . GERD (gastroesophageal reflux disease)   . History of post-sterilization tuboplasty 2000  . Plantar fasciitis    Right  . Shortness of breath    with exertion  . Sleep apnea 5 plus yrs   study -pt could not sleep test inconclusive.   . Tear of meniscus of left knee    x2  . Tear of meniscus of right knee     Past Surgical History:  Procedure Laterality Date  . CHOLECYSTECTOMY    . FOOT TENDON SURGERY    . KNEE ARTHROSCOPY  10/10/2011   Procedure: ARTHROSCOPY KNEE;  Surgeon: Newt Minion, MD;  Location: [facility];  Service: Orthopedics;  Laterality: Right;  Right Knee Arthroscopy and Excision Medial Meniscus  . MENISCUS REPAIR     R and L (L x2)  . TONSILLECTOMY    . TUBAL LIGATION      Family History  Problem Relation Age of Onset  . Breast cancer Sister        both sisters have breast cancer    Social History   Socioeconomic History  . Marital status: Married    Spouse name: Not on file  . Number of children: Not on file  . Years of education: GED +1 yr  . Highest education level: Not on file  Occupational History  . Occupation: unemployed    Fish farm manager: UNEMPLOYED  Tobacco Use  . Smoking status: Never Smoker  . Smokeless tobacco: Never Used  Vaping Use  . Vaping Use: Never used  Substance and Sexual Activity  . Alcohol use: No    Alcohol/week: 0.0 standard drinks  . Drug use: No  . Sexual activity: Not on file  Other Topics Concern  . Not on file  Social  History Narrative   Married, unemployed, Husband disabled and paraplegic 2/2 fall from tree stand while deer hunting, Son quadriplegic 2/2 MVA 05/2007 in [place] Strain:   . Difficulty of Paying Living Expenses:   Food Insecurity:   . Worried About Charity fundraiser in the Last Year:   . Arboriculturist in the Last Year:   Transportation Needs:   . Film/video editor (Medical):   Marland Kitchen Lack of Transportation (Non-Medical):   Physical Activity:   . Days of Exercise per Week:   . Minutes of Exercise per Session:   Stress:   . Feeling of Stress :   Social Connections:   . Frequency of Communication with Friends and Family:   . Frequency of Social Gatherings with Friends and Family:   . Attends Religious Services:   . Active Member of Clubs or Organizations:   . Attends Archivist Meetings:   Marland Kitchen Marital Status:   Intimate Partner Violence:   . Fear of Current or Ex-Partner:   . Emotionally Abused:   Marland Kitchen Physically Abused:   .  Sexually Abused:     Outpatient Medications Prior to Visit  Medication Sig Dispense Refill  . ACCU-CHEK FASTCLIX LANCETS MISC Check three times a day to check blood sugar. 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 insulin dependent 306 each 1  . atorvastatin (LIPITOR) 20 MG tablet Take 1 tablet (20 mg total) by mouth daily. 90 tablet 1  . B Complex CAPS Take 1 capsule by mouth daily. 30 capsule 3  . Blood Glucose Monitoring Suppl (ACCU-CHEK GUIDE ME) w/Device KIT 1 each by Does not apply route 3 (three) times daily. 1 kit 0  . cetaphil (CETAPHIL) lotion Apply 1 application topically as needed for dry skin. 236 mL 0  . cetirizine (ZYRTEC) 10 MG tablet Take 1 tablet (10 mg total) by mouth daily. 90 tablet 3  . clotrimazole (LOTRIMIN) 1 % cream Apply 1 application topically 2 (two) times daily. 30 g 0  . cyclobenzaprine (FLEXERIL) 10 MG tablet TAKE 1 TABLET(10 MG) BY MOUTH EVERY 12 HOURS AS NEEDED FOR MUSCLE SPASMS 60  tablet 5  . diclofenac sodium (VOLTAREN) 1 % GEL Apply 2 g topically 4 (four) times daily. 3 Tube 3  . diphenhydrAMINE (BENADRYL) 25 mg capsule Take 1 capsule (25 mg total) by mouth every 6 (six) hours as needed. 30 capsule 2  . empagliflozin (JARDIANCE) 10 MG TABS tablet Take 10 mg by mouth daily. 90 mg 3  . glucose blood (ACCU-CHEK GUIDE) test strip Check blood sugar 3 times a day 100 each 12  . insulin aspart (NOVOLOG FLEXPEN) 100 UNIT/ML FlexPen Inject 8 units before each meal three times a day 15 mL 5  . Insulin Glargine (LANTUS) 100 UNIT/ML Solostar Pen Inject 35 Units into the skin at bedtime. Diagnosis code E11.9. 45 mL 3  . Insulin Pen Needle (PEN NEEDLES) 32G X 4 MM MISC 1 each by Does not apply route 5 (five) times daily. 390 each 1  . Insulin Syringe-Needle U-100 31G X 15/64" 0.3 ML MISC Please use for insulin administration 3 time daily. 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 Insulin dependent 200 each 1  . liraglutide (VICTOZA) 18 MG/3ML SOPN Inject 0.1 mLs (0.6 mg total) into the skin daily for 7 days, THEN 0.2 mLs (1.2 mg total) daily. 3 pen 6  . meclizine (ANTIVERT) 12.5 MG tablet Take 1 tablet (12.5 mg total) by mouth 3 (three) times daily as needed for dizziness. 30 tablet 0  . meloxicam (MOBIC) 15 MG tablet Take 1 tablet (15 mg total) by mouth daily. 30 tablet 1  . NYSTATIN powder Apply topically 3 (three) times daily. 30 g 6  . oxybutynin (DITROPAN XL) 10 MG 24 hr tablet Take 1 tablet (10 mg total) by mouth daily. 30 tablet 2  . Oxycodone HCl 10 MG TABS Take 1 tablet (10 mg total) by mouth 4 (four) times daily as needed. 120 tablet 0  . rOPINIRole (REQUIP) 0.5 MG tablet TAKE 1 TABLET BY MOUTH EVERY NIGHT 1 TO 3 HOURS BEFORE BEDTIME 90 tablet 1  . triamcinolone cream (KENALOG) 0.1 % Apply 1 application topically 2 (two) times daily. 453.6 g 0   No facility-administered medications prior to visit.    Allergies  Allergen Reactions  . Byetta 10 Mcg Pen [Exenatide] Anaphylaxis    Throat swelling,  diaphoresis, and rash  . Metformin Rash, Shortness Of Breath and Nausea And Vomiting  . Ofloxacin     REACTION: rash, throat itching, tightness  . Mango Butter Hives and Rash    Throat shuts down  . Metformin And Related  Diarrhea    See 01/27/15 note    Review of Systems  Constitutional: Negative for chills, fever and unexpected weight change.  Respiratory: Negative for shortness of breath.   Cardiovascular: Negative for chest pain.  Gastrointestinal: Negative for abdominal pain, diarrhea, nausea and vomiting.  Endocrine: Negative for polydipsia, polyphagia and polyuria.  Musculoskeletal: Positive for arthralgias, back pain and myalgias.  Neurological: Negative for syncope, weakness, light-headedness and headaches.       Objective:    Physical Exam Constitutional:      General: She is not in acute distress.    Appearance: Normal appearance.  Cardiovascular:     Rate and Rhythm: Normal rate and regular rhythm.  Pulmonary:     Effort: Pulmonary effort is normal.     Breath sounds: Normal breath sounds.  Abdominal:     General: There is no distension.     Palpations: Abdomen is soft.     Tenderness: There is no abdominal tenderness.  Musculoskeletal:     Right lower leg: No edema.     Left lower leg: No edema.  Neurological:     General: No focal deficit present.     Mental Status: She is alert and oriented to person, place, and time.  Psychiatric:        Mood and Affect: Mood normal.        Behavior: Behavior normal.     BP (!) 143/68 (BP Location: Right Arm, Patient Position: Sitting, Cuff Size: Normal)   Pulse 78   Temp 98.1 F (36.7 C) (Oral)   Ht '5\' 1"'  (1.549 m)   Wt 199 lb 14.4 oz (90.7 kg)   LMP 07/04/2016   SpO2 98%   BMI 37.77 kg/m  Wt Readings from Last 3 Encounters:  01/22/20 199 lb 14.4 oz (90.7 kg)  11/05/19 199 lb 3.2 oz (90.4 kg)  10/27/19 198 lb 3.2 oz (89.9 kg)    Health Maintenance Due  Topic Date Due  . COLONOSCOPY  Never done  .  MAMMOGRAM  12/25/2017  . PAP SMEAR-Modifier  11/29/2018  . TETANUS/TDAP  08/30/2019    There are no preventive care reminders to display for this patient.   Lab Results  Component Value Date   TSH 1.240 12/31/2018   Lab Results  Component Value Date   WBC 12.5 (H) 12/20/2018   HGB 14.6 12/20/2018   HCT 43.0 12/20/2018   MCV 86.6 12/20/2018   PLT 336 12/20/2018   Lab Results  Component Value Date   NA 140 08/18/2019   K 3.7 08/18/2019   CO2 26 08/18/2019   GLUCOSE 175 (H) 08/18/2019   BUN 10 08/18/2019   CREATININE 0.74 08/18/2019   BILITOT 0.5 12/20/2018   ALKPHOS 131 (H) 12/20/2018   AST 20 12/20/2018   ALT 19 12/20/2018   PROT 6.6 12/20/2018   ALBUMIN 3.7 12/20/2018   CALCIUM 9.3 08/18/2019   ANIONGAP 11 08/18/2019   Lab Results  Component Value Date   CHOL 135 04/02/2019   Lab Results  Component Value Date   HDL 56 04/02/2019   Lab Results  Component Value Date   LDLCALC 63 04/02/2019   Lab Results  Component Value Date   TRIG 85 04/02/2019   Lab Results  Component Value Date   CHOLHDL 2.4 04/02/2019   Lab Results  Component Value Date   HGBA1C 6.8 (A) 01/22/2020       Assessment & Plan:   Problem List Items Addressed This Visit  Endocrine   Type 2 diabetes mellitus with retinopathy of both eyes, with long-term current use of insulin ([place]) - Primary    Patient has great glycemic control with A1C of 6.8 today. She has not had any recurrent episodes of symptomatic hypoglycemia since cutting back lantus to 30 units qhs and decreasing use of novolog.  Will not make any changes to medication regimen today. She is hoping to get her left total knee replacement done soon now that her A1C is < 7.       Relevant Orders   POC Hbg A1C (Completed)     Other   Long-term current use of opiate analgesic (Chronic)    atient recently switched insurance plans and was not able to get PA approved for Oxycodone 10 mg qid prn which she has been on for  several years to treat chronic pain related to OA in multiple joints, as well as a a herniated lumbar disc. The opioids keep her pain more manageable so that she can function throughout the day.  - Continue Oxycodone 10 mg QID prn.           Meds ordered this encounter  Medications  . Diclofenac Sodium 3 % GEL    Sig: Apply 2 g topically 4 (four) times daily.    Dispense:  100 g    Refill:  [place], DO

## 2020-01-25 NOTE — Progress Notes (Signed)
Internal Medicine Clinic Attending  Case discussed with Dr. Bloomfield  At the time of the visit.  We reviewed the resident's history and exam and pertinent patient test results.  I agree with the assessment, diagnosis, and plan of care documented in the resident's note.  

## 2020-01-26 ENCOUNTER — Telehealth: Payer: Self-pay | Admitting: Internal Medicine

## 2020-01-26 NOTE — Telephone Encounter (Signed)
Pharm has script, will call pt and she can pick up

## 2020-01-26 NOTE — Telephone Encounter (Signed)
Need refill on pin needle pt contact [place] [place]

## 2020-02-09 ENCOUNTER — Other Ambulatory Visit: Payer: Self-pay | Admitting: *Deleted

## 2020-02-09 DIAGNOSIS — Z794 Long term (current) use of insulin: Secondary | ICD-10-CM

## 2020-02-09 DIAGNOSIS — E119 Type 2 diabetes mellitus without complications: Secondary | ICD-10-CM

## 2020-02-09 MED ORDER — LIRAGLUTIDE 18 MG/3ML ~~LOC~~ SOPN: PEN_INJECTOR | SUBCUTANEOUS | 6 refills | Status: DC

## 2020-02-09 NOTE — Telephone Encounter (Signed)
Received faxed refill request from pt's pharmacy for [place].  Will send to appropriate team for consideration. Please review current dose prior to filling.[name]7/27/20219:51 AM

## 2020-02-17 ENCOUNTER — Other Ambulatory Visit: Payer: Self-pay | Admitting: Internal Medicine

## 2020-02-17 MED ORDER — OXYCODONE HCL 10 MG PO TABS: 10.0000 mg | ORAL_TABLET | Freq: Four times a day (QID) | ORAL | 0 refills | Status: DC | PRN

## 2020-02-17 NOTE — Telephone Encounter (Signed)
PDMP reviewed, patient has been filling medications appropriately.

## 2020-02-17 NOTE — Telephone Encounter (Signed)
Need refill on pain medicine Oxycodone HCl 10 MG TABS  ;pt contact [phone]   [id number] [place] [place] [address].

## 2020-02-18 ENCOUNTER — Other Ambulatory Visit: Payer: Self-pay

## 2020-02-18 NOTE — Telephone Encounter (Signed)
[facility] [address] [place] [facility] and it may be months before they can fill. [facility]elled [facility]

## 2020-02-18 NOTE — Telephone Encounter (Signed)
Pt would like Oxycodone HCl 10 MG TABS to be sent to  [id number] [place], [place]. Phone:  [phone]  Fax:  [phone]     Please call pt back.

## 2020-02-19 MED ORDER — OXYCODONE HCL 10 MG PO TABS: 10.0000 mg | ORAL_TABLET | Freq: Four times a day (QID) | ORAL | 0 refills | Status: DC | PRN

## 2020-02-19 NOTE — Telephone Encounter (Signed)
Patient calling again [facility] [address] She would like a call when this has been done. Hubbard Hartshorn, BSN, RN-BC

## 2020-02-19 NOTE — Telephone Encounter (Signed)
Pt calling back requesting to know when her prescription for her Oxycodone  will be ready to pick up.  Pt states she starting calling on  02/17/2020.

## 2020-03-03 NOTE — Addendum Note (Signed)
Addended by: Hulan Fray on: 03/03/2020 04:56 PM   Modules accepted: Orders

## 2020-03-15 ENCOUNTER — Other Ambulatory Visit: Payer: Self-pay | Admitting: Internal Medicine

## 2020-03-15 DIAGNOSIS — Z794 Long term (current) use of insulin: Secondary | ICD-10-CM

## 2020-03-15 DIAGNOSIS — E119 Type 2 diabetes mellitus without complications: Secondary | ICD-10-CM

## 2020-03-15 MED ORDER — MELOXICAM 15 MG PO TABS: 15.0000 mg | ORAL_TABLET | Freq: Every day | ORAL | 1 refills | Status: DC

## 2020-03-15 MED ORDER — OXYCODONE HCL 10 MG PO TABS: 10.0000 mg | ORAL_TABLET | Freq: Four times a day (QID) | ORAL | 0 refills | Status: DC | PRN

## 2020-03-15 NOTE — Telephone Encounter (Signed)
Pt has refills on lantus and pen needles per pharmacy

## 2020-03-15 NOTE — Telephone Encounter (Signed)
Refill requet   Oxycodone HCl 10 MG TABS\  [id number] [place], [place] - [address]

## 2020-03-15 NOTE — Telephone Encounter (Signed)
Need refill on Insulin Glargine (LANTUS) 100 UNIT/ML Solostar Pen meloxicam (MOBIC) 15 MG tablet  Insulin Pen Needle (PEN NEEDLES) 32G X 4 MM MISC  ;pt contact [phone]   [id number] - [place], [place] - [address]

## 2020-03-24 ENCOUNTER — Encounter: Payer: Self-pay | Admitting: Student

## 2020-03-24 ENCOUNTER — Other Ambulatory Visit: Payer: Self-pay | Admitting: Student

## 2020-03-24 ENCOUNTER — Other Ambulatory Visit: Payer: Self-pay

## 2020-03-24 ENCOUNTER — Ambulatory Visit: Payer: Medicaid Other | Admitting: Student

## 2020-03-24 VITALS — BP 141/83 | HR 68 | Wt 197.7 lb

## 2020-03-24 DIAGNOSIS — Z79891 Long term (current) use of opiate analgesic: Secondary | ICD-10-CM | POA: Diagnosis not present

## 2020-03-24 DIAGNOSIS — M79671 Pain in right foot: Secondary | ICD-10-CM

## 2020-03-24 DIAGNOSIS — Z794 Long term (current) use of insulin: Secondary | ICD-10-CM | POA: Diagnosis not present

## 2020-03-24 DIAGNOSIS — M25561 Pain in right knee: Secondary | ICD-10-CM

## 2020-03-24 DIAGNOSIS — M7501 Adhesive capsulitis of right shoulder: Secondary | ICD-10-CM

## 2020-03-24 DIAGNOSIS — Z1231 Encounter for screening mammogram for malignant neoplasm of breast: Secondary | ICD-10-CM

## 2020-03-24 DIAGNOSIS — M797 Fibromyalgia: Secondary | ICD-10-CM | POA: Diagnosis not present

## 2020-03-24 DIAGNOSIS — Z23 Encounter for immunization: Secondary | ICD-10-CM | POA: Diagnosis not present

## 2020-03-24 DIAGNOSIS — Z0000 Encounter for general adult medical examination without abnormal findings: Secondary | ICD-10-CM

## 2020-03-24 DIAGNOSIS — E113393 Type 2 diabetes mellitus with moderate nonproliferative diabetic retinopathy without macular edema, bilateral: Secondary | ICD-10-CM

## 2020-03-24 DIAGNOSIS — Z1211 Encounter for screening for malignant neoplasm of colon: Secondary | ICD-10-CM | POA: Diagnosis not present

## 2020-03-24 DIAGNOSIS — G8929 Other chronic pain: Secondary | ICD-10-CM | POA: Diagnosis not present

## 2020-03-24 DIAGNOSIS — M25511 Pain in right shoulder: Secondary | ICD-10-CM | POA: Diagnosis not present

## 2020-03-24 DIAGNOSIS — M5441 Lumbago with sciatica, right side: Secondary | ICD-10-CM

## 2020-03-24 DIAGNOSIS — E785 Hyperlipidemia, unspecified: Secondary | ICD-10-CM

## 2020-03-24 DIAGNOSIS — M25562 Pain in left knee: Secondary | ICD-10-CM

## 2020-03-24 DIAGNOSIS — M5442 Lumbago with sciatica, left side: Secondary | ICD-10-CM

## 2020-03-24 DIAGNOSIS — N3941 Urge incontinence: Secondary | ICD-10-CM

## 2020-03-24 MED ORDER — OXYBUTYNIN CHLORIDE ER 10 MG PO TB24: 10.0000 mg | ORAL_TABLET | Freq: Every day | ORAL | 2 refills | Status: DC

## 2020-03-24 MED ORDER — ATORVASTATIN CALCIUM 20 MG PO TABS: 20.0000 mg | ORAL_TABLET | Freq: Every day | ORAL | 1 refills | Status: DC

## 2020-03-24 NOTE — Progress Notes (Signed)
   CC: "I need referrals"  HPI:  [name] is a 59 y.o. woman with history of chronic pain, DM, HTN who presents to clinic to obtain referrals, health maintenance, and DM follow-up. Their last clinic visit was ~2 months ago on 01/22/20.   To see the details of this patient's management of their acute and chronic problems, please refer to the A&P under the Encounters tab.    Past Medical History:  Diagnosis Date  . 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 Arthritis   . Diabetes mellitus    oral tx  . GERD (gastroesophageal reflux disease)   . History of post-sterilization tuboplasty 2000  . Plantar fasciitis    Right  . Shortness of breath    with exertion  . Sleep apnea 5 plus yrs   study -pt could not sleep test inconclusive.   . Tear of meniscus of left knee    x2  . Tear of meniscus of right knee    Review of Systems:    Review of Systems  Constitutional: Negative for malaise/fatigue and weight loss.  Eyes: Negative for blurred vision.  Respiratory: Negative for cough and shortness of breath.   Cardiovascular: Negative for chest pain and palpitations.  Musculoskeletal: Positive for back pain and joint pain. Negative for myalgias.  Neurological: Negative for dizziness, focal weakness, weakness and headaches.    Physical Exam:  There were no vitals filed for this visit. Constitutional: well-appearing obese woman sitting in chair, in no acute distress, appears to wince occasionally with position adjustment in chair HENT: normocephalic atraumatic Cardiovascular: regular rate and rhythm, no m/r/g Pulmonary/Chest: normal work of breathing on room air, CTAB Neurological: alert and oriented x 3   Assessment & Plan:   See Encounters Tab for problem based charting.  Patient seen with Dr. Dareen Piano

## 2020-03-24 NOTE — Patient Instructions (Signed)
[name],   Thank you for your visit to the [facility] today. It was a pleasure seeing you. Today we discussed the following:  1) Chronic pain: I placed several referrals today. These referrals are listing in this after visit summary.  2) Diabetes:  - Continue your Lantus and Victoza.  - Keep an eye out for episodes of low blood sugar. Avoid using your Novolog. - We will repeat your A1c at follow-up.  3) Refills - I refilled your atorvastatin - Also refilled your oxybutynin  4) Health maintenance: - You received your flu shot today - I have placed a referral for your mammogram - You will get the card and information for your colon cancer screening today - Discuss cervical cancer screening (pap smear) at next visit   We would like to see you back in 3 months for diabetes follow-up.   If you have any questions or concerns, please call our clinic at [phone] between 9am-5pm. Outside of these hours, call [phone] and ask for the internal medicine resident on call. If you feel you are having a medical emergency please call 911.

## 2020-03-24 NOTE — Assessment & Plan Note (Signed)
Patient reports the prior authorization issue for obtaining oxycodone 10 mg QID PRN has been resolved. The opioids keep her pain management and maintain her function throughout the day.

## 2020-03-24 NOTE — Assessment & Plan Note (Signed)
-   Flu shot today  - Order for mammogram placed  - FIT testing ordered today  - Cervical cancer screening (pap) at next appointment

## 2020-03-24 NOTE — Assessment & Plan Note (Signed)
Discussed with patient our concern for fragmented care given that she is seeing 4-5 different providers for her variety of pain etiologies (lower back, R shoulder, bilateral feet, L knee). She reports that she ended up with these various providers after being referred to them by a sports medicine specialist. Discussed medications such as duloxetine and gabapentin, however she reports having tried these in the past. She notes that the only medicines which have brought relief of her pain have been opioids (oxycodone, tramadol, hydrocodone). She is continuing with her oxycodone 10 mg 4x daily. She reports some improvement in her symptoms with weight loss and stretching.  Plan: - Encouraged staying active, working on sleep hygiene - Consider cognitive behavioral therapy in the future - oxycodone 10 mg 4 times daily

## 2020-03-24 NOTE — Assessment & Plan Note (Signed)
Patient A1c 6.8 at last visit ~2 months ago. She reports having had 1 episode of symptomatic hypoglycemia two nights ago after using novolog 2 u for a blood sugar of 289. She reports she woke up diaphoretic and her blood sugar was 51.   Plan: - Continue Lantus 30 u QHS - Continue Victoza and Jardiance - Discouraged use of novolog sliding scale - A1c at next visit - Repeat A1c

## 2020-03-24 NOTE — Assessment & Plan Note (Signed)
>>  ASSESSMENT AND PLAN FOR CHRONIC MIDLINE LOW BACK PAIN WITH BILATERAL SCIATICA WRITTEN ON 03/24/2020 12:49 PM BY WATSON, JULIA, MD  Patient requests referral to Dr. Chesley at the [facility], noting that Medicaid requires her to obtain referrals for these appointments. She reports she last saw this provider on 01/26/20 and has a long-standing relationship. At this visit she radiofrequency ablation (RFA) was performed on her L lower back.   Plan: - Referral placed for Dr. Chesley at [facility] [phone])

## 2020-03-24 NOTE — Assessment & Plan Note (Signed)
Patient reports continued chronic R shoulder pain. She is requesting referral to her orthopedic PA, Hervey Ard who she last saw in May or June of this year for R shoulder steroid injection. She has been following with this provider for some time.  Plan: - Referral to Hervey Ard [phone]) placed

## 2020-03-24 NOTE — Assessment & Plan Note (Signed)
Patient reports she has not had her oxybutynin for the last few months. Reports symptoms of urge incontinence.  Plan: - Refilled oxybutynin 10 mg daily

## 2020-03-24 NOTE — Assessment & Plan Note (Signed)
Patient requests referral to Dr. Danne Baxter at the [facility], noting that Medicaid requires her to obtain referrals for these appointments. She reports she last saw this provider on 01/26/20 and has a long-standing relationship. At this visit she radiofrequency ablation (RFA) was performed on her L lower back.   Plan: - Referral placed for Dr. Danne Baxter at [facility] [phone])

## 2020-04-04 NOTE — Progress Notes (Signed)
Internal Medicine Clinic Attending  I saw and evaluated the patient.  I personally confirmed the key portions of the history and exam documented by Dr. Watson and I reviewed pertinent patient test results.  The assessment, diagnosis, and plan were formulated together and I agree with the documentation in the resident's note.  

## 2020-04-07 ENCOUNTER — Encounter: Payer: Self-pay | Admitting: Physical Medicine and Rehabilitation

## 2020-04-11 ENCOUNTER — Telehealth: Payer: Self-pay | Admitting: Internal Medicine

## 2020-04-11 DIAGNOSIS — M720 Palmar fascial fibromatosis [Dupuytren]: Secondary | ICD-10-CM

## 2020-04-11 NOTE — Telephone Encounter (Signed)
Pt reports she was seen on 03/24/2020 and was supposed to have a Referral placed for a Hand Surgeon along with the other [facility] Referral that were placed.  Please advise if a referral was to be placed.

## 2020-04-12 ENCOUNTER — Ambulatory Visit: Payer: Medicaid Other

## 2020-04-13 NOTE — Telephone Encounter (Signed)
Thank you.  If you place the Referral I will be glad to send it to the [facility].

## 2020-04-13 NOTE — Telephone Encounter (Signed)
Just spoke with patient. She does need a referral to her hand surgeon. She cannot remember the name off the top of her head but says she told you yesterday? If you know the name, I'm happy to place the referral.

## 2020-04-14 ENCOUNTER — Ambulatory Visit: Payer: Self-pay | Admitting: Podiatry

## 2020-04-19 ENCOUNTER — Ambulatory Visit: Payer: Self-pay

## 2020-04-19 ENCOUNTER — Ambulatory Visit (INDEPENDENT_AMBULATORY_CARE_PROVIDER_SITE_OTHER): Payer: Medicaid Other | Admitting: Orthopaedic Surgery

## 2020-04-19 ENCOUNTER — Encounter: Payer: Self-pay | Admitting: Orthopaedic Surgery

## 2020-04-19 VITALS — Ht 60.5 in | Wt 200.0 lb

## 2020-04-19 DIAGNOSIS — M1612 Unilateral primary osteoarthritis, left hip: Secondary | ICD-10-CM | POA: Diagnosis not present

## 2020-04-19 DIAGNOSIS — G8929 Other chronic pain: Secondary | ICD-10-CM

## 2020-04-19 DIAGNOSIS — M1712 Unilateral primary osteoarthritis, left knee: Secondary | ICD-10-CM | POA: Diagnosis not present

## 2020-04-19 DIAGNOSIS — M25511 Pain in right shoulder: Secondary | ICD-10-CM | POA: Diagnosis not present

## 2020-04-19 NOTE — Progress Notes (Signed)
Office Visit Note   Patient: [name]           Date of Birth: 08/05/1960           MRN: [id number] Visit Date: 04/19/2020              Requested by: Aldine Contes, MD [address], [place],  [place] [address] PCP: Mitzi Hansen, MD   Assessment & Plan: Visit Diagnoses:  1. Primary localized osteoarthritis of left knee   2. Chronic right shoulder pain   3. Unilateral primary osteoarthritis, left hip     Plan: Impression is left knee advanced degenerative joint disease, left hip degenerative joint disease and right shoulder rotator cuff tendinopathy.  In regards to the left knee, she has tried and failed cortisone injections as well as oral anti-inflammatories.  She is ready to proceed with left total knee replacement.  Risk, benefits and possible complications reviewed.  Rehab recovery time discussed.  Her last hemoglobin A1c from almost 3 months ago was 6.8, so we will repeat this today.  In regards to the left hip, we will refer her to Dr. Junius Roads for ultrasound-guided cortisone injection.  She would like to address her left hip and left knee prior to definitive treatment for the right shoulder.  Follow-Up Instructions: Return for two weeks post-op.   Orders:  Orders Placed This Encounter  Procedures  . XR KNEE 3 VIEW LEFT  . XR Shoulder Right  . XR HIP UNILAT W OR W/O PELVIS 2-3 VIEWS LEFT   No orders of the defined types were placed in this encounter.     Procedures: No procedures performed   Clinical Data: No additional findings.   Subjective: Chief Complaint  Patient presents with  . Left Knee - Pain  . Right Shoulder - Pain    HPI patient is a pleasant 59 year old female who comes in today with left hip, left knee and right shoulder pain.  In regards to her left hip, she has had pain here for the past 1 to 2 months.  No known injury but her pain does appear to be worsening.  All of the pain is to the groin.  She does note  occasional muscle spasms to her left leg as well as tingling to the same area.  She does have a history of lumbar pathology and gets frequent RFAs.  In regards to her left knee, she has had pain here for the past 20 years after falling off of a ladder.  She has had 2 arthroscopic meniscectomies.  All of her pain is to the medial aspect which is worse with ambulation and activity.  She has pain at night as well.  She does wear a hinged knee brace which seems to provide some support.  She is on chronic oxycodone for her multiple problems.  She has had cortisone injections in the past without temporary relief of symptoms.  In regards to her right shoulder, she has had pain here for the past 2 years which is progressively worsened.  The pain is to the anterior aspect with slight weakness.  She has increased pain with shoulder abduction as well as sleeping on the right side.  She has had a cortisone injection in the past with only few days relief of symptoms.  She has worked on a home exercise program without relief.  Subsequent MRI of the shoulder showed mild to moderate supraspinatus tendinopathy.  Moderate AC degenerative changes as well were noted.  No numbness, tingling or burning to the right upper extremity.  Review of Systems as detailed in HPI.  All others reviewed and are negative.   Objective: Vital Signs: Ht 5' 0.5" (1.537 m)   Wt 200 lb (90.7 kg)   LMP 07/04/2016   BMI 38.42 kg/m   Physical Exam well-developed well-nourished female no acute distress.  Alert oriented x3.  Ortho Exam left knee exam shows varus deformity.  Range of motion 0 to 115 degrees.  Medial joint line tenderness.  Moderate patellofemoral crepitus.  Ligaments are stable.  She is neurovascular intact distally.  She has a positive FADIR and positive logroll.  Negative straight leg raise.  Right shoulder exam shows full active range of motion all planes.  She has a very minimal empty can with full strength throughout.  Negative  cross body adduction.  Minimal tenderness to the [facility] joint.  She is neurovascular intact distally.  Specialty Comments:  No specialty comments available.  Imaging: XR HIP UNILAT W OR W/O PELVIS 2-3 VIEWS LEFT  Result Date: 04/19/2020 Moderate joint space narrowing  XR KNEE 3 VIEW LEFT  Result Date: 04/19/2020 Left knee shows advanced degenerative changes to the medial and patellofemoral compartments  XR Shoulder Right  Result Date: 04/19/2020 Moderate degenerative changes the [facility] joint.  Mild glenohumeral changes.    PMFS History: Patient Active Problem List   Diagnosis Date Noted  . Obesity (BMI 30-39.9) 04/02/2019  . Urinary incontinence 01/01/2019  . Chronic right shoulder pain 12/01/2018  . Left hip pain 12/05/2017  . Gluteal tendinitis, left hip 10/04/2017  . Chronic midline low back pain with bilateral sciatica 01/31/2017  . Restless legs syndrome (RLS) 10/24/2016  . Long-term current use of opiate analgesic 03/01/2016  . Hot flashes 12/29/2015  . Carpal tunnel syndrome of right wrist 12/15/2015  . S/P left rotator cuff repair 12/09/2015  . Family history of breast cancer 11/29/2015  . Acromioclavicular joint arthritis 09/19/2015  . Chronic back pain 06/15/2013  . Insomnia 12/02/2012  . Episode of dizziness 06/02/2012  . Fibromyalgia 05/23/2012  . Healthcare maintenance 03/01/2011  . Hyperlipidemia 07/27/2010  . Type 2 diabetes mellitus with retinopathy of both eyes, with long-term current use of insulin ([place]) 07/26/2010  . ADHESIVE CAPSULITIS, RIGHT 09/04/2006  . Other tear of cartilage or meniscus of knee, current 09/04/2006   Past Medical History:  Diagnosis Date  . 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 Arthritis   . Diabetes mellitus    oral tx  . GERD (gastroesophageal reflux disease)   . History of post-sterilization tuboplasty 2000  . Plantar fasciitis    Right  . Shortness of breath    with exertion    . Sleep apnea 5 plus yrs   study -pt could not sleep test inconclusive.   . Tear of meniscus of left knee    x2  . Tear of meniscus of right knee     Family History  Problem Relation Age of Onset  . Breast cancer Sister        both sisters have breast cancer    Past Surgical History:  Procedure Laterality Date  . CHOLECYSTECTOMY    . FOOT TENDON SURGERY    . KNEE ARTHROSCOPY  10/10/2011   Procedure: ARTHROSCOPY KNEE;  Surgeon: Newt Minion, MD;  Location: [facility];  Service: Orthopedics;  Laterality: Right;  Right Knee Arthroscopy and Excision Medial Meniscus  . MENISCUS REPAIR  R and L (L x2)  . TONSILLECTOMY    . TUBAL LIGATION     Social History   Occupational History  . Occupation: unemployed    Fish farm manager: UNEMPLOYED  Tobacco Use  . Smoking status: Never Smoker  . Smokeless tobacco: Never Used  Vaping Use  . Vaping Use: Never used  Substance and Sexual Activity  . Alcohol use: No    Alcohol/week: 0.0 standard drinks  . Drug use: No  . Sexual activity: Not on file

## 2020-04-19 NOTE — Progress Notes (Signed)
Subjective: Patient is here for ultrasound-guided intra-articular left hip injection.   Pain in the groin.  Objective:  Pain and stiffness with IR.    Procedure: Ultrasound-guided left hip injection: After sterile prep with Betadine, injected 8 cc 1% lidocaine without epinephrine and 40 mg methylprednisolone using a 22-gauge spinal needle, passing the needle through the iliofemoral ligament into the femoral head/neck junction.  Injectate seen filling joint capsule.  Modest immediate improvement.

## 2020-04-20 ENCOUNTER — Other Ambulatory Visit: Payer: Self-pay | Admitting: Internal Medicine

## 2020-04-20 ENCOUNTER — Other Ambulatory Visit: Payer: Self-pay

## 2020-04-20 LAB — PREALBUMIN: Prealbumin: 20 mg/dL (ref 17–34)

## 2020-04-20 LAB — HEMOGLOBIN A1C
Hgb A1c MFr Bld: 7.9 % of total Hgb — ABNORMAL HIGH (ref <5.7)
Mean Plasma Glucose: 180 (calc)
eAG (mmol/L): 10 (calc)

## 2020-04-20 MED ORDER — OXYCODONE HCL 10 MG PO TABS
10.0000 mg | ORAL_TABLET | Freq: Four times a day (QID) | ORAL | 0 refills | Status: DC | PRN
Start: 2020-04-20 — End: 2020-04-20

## 2020-04-20 MED ORDER — OXYCODONE HCL 10 MG PO TABS
10.0000 mg | ORAL_TABLET | Freq: Four times a day (QID) | ORAL | 0 refills | Status: DC | PRN
Start: 2020-04-20 — End: 2020-05-18

## 2020-04-20 NOTE — Telephone Encounter (Signed)
Pt states Oxycodone HCl 10 MG TABS was sent to the wrong pharmacy. Pt would [facility] Please call pt back.

## 2020-04-20 NOTE — Progress Notes (Signed)
Her A1C is 7.9, it needs to be below 7.8 but I think she can easily get it down.  Let's schedule her for at least 1 month from now and then recheck the week before her surgery.

## 2020-04-20 NOTE — Telephone Encounter (Signed)
Refill Request  Oxycodone HCl 10 MG TABS  [id number] [place], [place] - [address]

## 2020-04-21 ENCOUNTER — Telehealth: Payer: Self-pay | Admitting: Orthopaedic Surgery

## 2020-04-21 ENCOUNTER — Ambulatory Visit: Payer: Self-pay | Admitting: Podiatry

## 2020-04-21 NOTE — Telephone Encounter (Signed)
Patient called needing to know the results of her A1C? The number to contact patient is [phone]

## 2020-04-22 NOTE — Telephone Encounter (Signed)
Advised on her results. (7.9 - A1C.)  She would like a Call to schedule SU.

## 2020-04-27 ENCOUNTER — Other Ambulatory Visit: Payer: Self-pay | Admitting: Internal Medicine

## 2020-04-27 DIAGNOSIS — E119 Type 2 diabetes mellitus without complications: Secondary | ICD-10-CM

## 2020-04-27 DIAGNOSIS — Z794 Long term (current) use of insulin: Secondary | ICD-10-CM

## 2020-04-27 NOTE — Telephone Encounter (Signed)
Pls contact regarding prior authorization [phone]

## 2020-04-27 NOTE — Telephone Encounter (Signed)
RTC to patient.  Needs refill on Lantus and an appointment to see her doctor.  Appointment has been scheduled for 04/28/2020 at 9:15 AM.

## 2020-04-28 ENCOUNTER — Encounter: Payer: Medicaid Other | Admitting: Internal Medicine

## 2020-04-28 NOTE — Telephone Encounter (Signed)
I called patient and scheduled surgery. 

## 2020-05-02 ENCOUNTER — Ambulatory Visit (INDEPENDENT_AMBULATORY_CARE_PROVIDER_SITE_OTHER): Payer: Medicaid Other | Admitting: Podiatry

## 2020-05-02 ENCOUNTER — Other Ambulatory Visit: Payer: Self-pay

## 2020-05-02 ENCOUNTER — Ambulatory Visit (INDEPENDENT_AMBULATORY_CARE_PROVIDER_SITE_OTHER): Payer: Medicaid Other

## 2020-05-02 ENCOUNTER — Ambulatory Visit: Payer: Medicaid Other

## 2020-05-02 DIAGNOSIS — M7989 Other specified soft tissue disorders: Secondary | ICD-10-CM | POA: Diagnosis not present

## 2020-05-02 DIAGNOSIS — M722 Plantar fascial fibromatosis: Secondary | ICD-10-CM | POA: Diagnosis not present

## 2020-05-02 DIAGNOSIS — M7752 Other enthesopathy of left foot: Secondary | ICD-10-CM | POA: Diagnosis not present

## 2020-05-02 DIAGNOSIS — M7750 Other enthesopathy of unspecified foot: Secondary | ICD-10-CM

## 2020-05-02 DIAGNOSIS — M7751 Other enthesopathy of right foot: Secondary | ICD-10-CM | POA: Diagnosis not present

## 2020-05-02 MED ORDER — INSULIN GLARGINE 100 UNIT/ML SOLOSTAR PEN: 35.0000 [IU] | PEN_INJECTOR | Freq: Every day | SUBCUTANEOUS | 3 refills | Status: DC

## 2020-05-02 NOTE — Telephone Encounter (Signed)
Pt is calling regarding medicine [phone]

## 2020-05-02 NOTE — Progress Notes (Signed)
Subjective:   Patient ID: [name], female   DOB: 59 y.o.   MRN: [id number]   HPI 59 year old female presents the office today for concerns about foot pain on the right side worse than left.  She has a history of plantar fibromatosis and also palmar fibromatosis where she has had surgery on the hands as well as the left foot.  She states the mass is starting to come back on the left side.  This was done about 5 years ago she reports.  She also has a painful masses never had surgery in the right foot she has had steroid injections to the area which has not been helpful and she was discussed other treatment options at this time.  She is diabetic and last A1c was 7.9.  Denies any ulcerations.  She has no other concerns today.   Review of Systems  All other systems reviewed and are negative.  Past Medical History:  Diagnosis Date  . 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 Arthritis   . Diabetes mellitus    oral tx  . GERD (gastroesophageal reflux disease)   . History of post-sterilization tuboplasty 2000  . Plantar fasciitis    Right  . Shortness of breath    with exertion  . Sleep apnea 5 plus yrs   study -pt could not sleep test inconclusive.   . Tear of meniscus of left knee    x2  . Tear of meniscus of right knee     Past Surgical History:  Procedure Laterality Date  . CHOLECYSTECTOMY    . FOOT TENDON SURGERY    . KNEE ARTHROSCOPY  10/10/2011   Procedure: ARTHROSCOPY KNEE;  Surgeon: Newt Minion, MD;  Location: [facility];  Service: Orthopedics;  Laterality: Right;  Right Knee Arthroscopy and Excision Medial Meniscus  . MENISCUS REPAIR     R and L (L x2)  . TONSILLECTOMY    . TUBAL LIGATION       Current Outpatient Medications:  .  naloxone (NARCAN) 2 MG/2ML injection, For suspected opioid overdose, spray 1 mL in each nostril.  Repeat after 3 minutes if no or minimal response., Disp: , Rfl:  .  ACCU-CHEK FASTCLIX LANCETS  MISC, Check three times a day to check blood sugar. 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 insulin dependent, Disp: 306 each, Rfl: 1 .  atorvastatin (LIPITOR) 20 MG tablet, Take 1 tablet (20 mg total) by mouth daily., Disp: 90 tablet, Rfl: 1 .  B Complex CAPS, Take 1 capsule by mouth daily., Disp: 30 capsule, Rfl: 3 .  Blood Glucose Monitoring Suppl (ACCU-CHEK GUIDE ME) w/Device KIT, 1 each by Does not apply route 3 (three) times daily., Disp: 1 kit, Rfl: 0 .  cetaphil (CETAPHIL) lotion, Apply 1 application topically as needed for dry skin., Disp: 236 mL, Rfl: 0 .  cetirizine (ZYRTEC) 10 MG tablet, Take 1 tablet (10 mg total) by mouth daily., Disp: 90 tablet, Rfl: 3 .  clotrimazole (LOTRIMIN) 1 % cream, Apply 1 application topically 2 (two) times daily., Disp: 30 g, Rfl: 0 .  cyclobenzaprine (FLEXERIL) 10 MG tablet, TAKE 1 TABLET(10 MG) BY MOUTH EVERY 12 HOURS AS NEEDED FOR MUSCLE SPASMS, Disp: 60 tablet, Rfl: 5 .  diclofenac sodium (VOLTAREN) 1 % GEL, Apply 2 g topically 4 (four) times daily., Disp: 3 Tube, Rfl: 3 .  Diclofenac Sodium 3 % GEL, Apply 2 g topically 4 (four) times daily., Disp: 100 g, Rfl: 3 .  diphenhydrAMINE (BENADRYL) 25 mg capsule, Take 1 capsule (25 mg total) by mouth every 6 (six) hours as needed., Disp: 30 capsule, Rfl: 2 .  empagliflozin (JARDIANCE) 10 MG TABS tablet, Take 10 mg by mouth daily., Disp: 90 mg, Rfl: 3 .  glucose blood (ACCU-CHEK GUIDE) test strip, Check blood sugar 3 times a day, Disp: 100 each, Rfl: 12 .  insulin aspart (NOVOLOG FLEXPEN) 100 UNIT/ML FlexPen, Inject 8 units before each meal three times a day, Disp: 15 mL, Rfl: 5 .  insulin glargine (LANTUS) 100 UNIT/ML Solostar Pen, Inject 35 Units into the skin at bedtime. Diagnosis code E11.9., Disp: 45 mL, Rfl: 3 .  Insulin Pen Needle (PEN NEEDLES) 32G X 4 MM MISC, 1 each by Does not apply route 5 (five) times daily., Disp: 390 each, Rfl: 1 .  Insulin Syringe-Needle U-100 31G X 15/64" 0.3 ML MISC, Please use for insulin  administration 3 time daily. 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 Insulin dependent, Disp: 200 each, Rfl: 1 .  liraglutide (VICTOZA) 18 MG/3ML SOPN, Inject 0.1 mLs (0.6 mg total) into the skin daily for 7 days, THEN 0.2 mLs (1.2 mg total) daily., Disp: 3 pen, Rfl: 6 .  meclizine (ANTIVERT) 12.5 MG tablet, Take 1 tablet (12.5 mg total) by mouth 3 (three) times daily as needed for dizziness., Disp: 30 tablet, Rfl: 0 .  meloxicam (MOBIC) 15 MG tablet, Take 1 tablet (15 mg total) by mouth daily., Disp: 30 tablet, Rfl: 1 .  NYSTATIN powder, Apply topically 3 (three) times daily., Disp: 30 g, Rfl: 6 .  oxybutynin (DITROPAN XL) 10 MG 24 hr tablet, Take 1 tablet (10 mg total) by mouth daily., Disp: 30 tablet, Rfl: 2 .  Oxycodone HCl 10 MG TABS, Take 1 tablet (10 mg total) by mouth 4 (four) times daily as needed., Disp: 120 tablet, Rfl: 0 .  rOPINIRole (REQUIP) 0.5 MG tablet, TAKE 1 TABLET BY MOUTH EVERY NIGHT 1 TO 3 HOURS BEFORE BEDTIME, Disp: 90 tablet, Rfl: 1 .  triamcinolone cream (KENALOG) 0.1 %, Apply 1 application topically 2 (two) times daily., Disp: 453.6 g, Rfl: 0  Allergies  Allergen Reactions  . Byetta 10 Mcg Pen [Exenatide] Anaphylaxis    Throat swelling, diaphoresis, and rash  . Metformin Rash, Shortness Of Breath and Nausea And Vomiting  . Ofloxacin     REACTION: rash, throat itching, tightness  . Mango Butter Hives and Rash    Throat shuts down  . Metformin And Related Diarrhea    See 01/27/15 note         Objective:  Physical Exam  General: AAO x3, NAD  Dermatological: Skin is warm, dry and supple bilateral.  There are no open sores, no preulcerative lesions, no rash or signs of infection present.  Vascular: Dorsalis Pedis artery and Posterior Tibial artery pedal pulses are 2/4 bilateral with immedate capillary fill time.  There is no pain with calf compression, swelling, warmth, erythema.   Neruologic: Grossly intact via light touch bilateral.   Musculoskeletal: Mobile firm mass present on  the medial aspect the right foot just proximal to the metatarsal head.  Appears to be either the fibromatosis versus possible cyst.  Scars present on the plantar aspect of the left foot from prior surgery and minimal plantar fibroma has come back.  In general she is also complaining of spasms to her feet which is been ongoing for greater than 10 years that she has been treated for.  Muscular strength 5/5 in all groups tested bilateral.  Gait: Unassisted, Nonantalgic.       Assessment:   Soft tissue mass right foot, history of plantar fibromatosis    Plan:  -Treatment options discussed including all alternatives, risks, and complications -Etiology of symptoms were discussed -X-rays obtained reviewed.  No evidence of calcifications, foreign body. -At this time on the right foot we discussed surgical excision versus conservative treatment.  She is tried conservative treatment with insignificant improvement.  We will likely pursue a surgical excision but prior to this and will order an ultrasound of the area to further evaluate given the location and consistency of the mass.  Trula Slade DPM

## 2020-05-10 ENCOUNTER — Other Ambulatory Visit: Payer: Self-pay

## 2020-05-10 ENCOUNTER — Ambulatory Visit: Payer: Medicaid Other | Admitting: Physical Medicine and Rehabilitation

## 2020-05-18 ENCOUNTER — Other Ambulatory Visit: Payer: Self-pay

## 2020-05-18 MED ORDER — OXYCODONE HCL 10 MG PO TABS
10.0000 mg | ORAL_TABLET | Freq: Four times a day (QID) | ORAL | 0 refills | Status: DC | PRN
Start: 2020-05-20 — End: 2020-06-07

## 2020-05-18 MED ORDER — MELOXICAM 15 MG PO TABS: 15.0000 mg | ORAL_TABLET | Freq: Every day | ORAL | 1 refills | Status: DC

## 2020-05-18 NOTE — Telephone Encounter (Signed)
Oxycodone HCl 10 MG TABS, REFILL REQUEST @  [id number] [place] [place] Phone:  [phone]  Fax:  [phone]     meloxicam (MOBIC) 15 MG tablet, REFILL REQUEST @  [facility] [phone] - [name], [place] Phone:  [phone]  Fax:  [phone]

## 2020-05-26 ENCOUNTER — Other Ambulatory Visit: Payer: Self-pay

## 2020-05-26 DIAGNOSIS — Z794 Long term (current) use of insulin: Secondary | ICD-10-CM

## 2020-05-26 DIAGNOSIS — E119 Type 2 diabetes mellitus without complications: Secondary | ICD-10-CM

## 2020-05-26 MED ORDER — LIRAGLUTIDE 18 MG/3ML ~~LOC~~ SOPN: 1.2000 mg | PEN_INJECTOR | Freq: Every day | SUBCUTANEOUS | 2 refills | Status: DC

## 2020-05-26 NOTE — Telephone Encounter (Signed)
liraglutide (VICTOZA) 18 MG/3ML SOPN(Expired, REFILL REQUEST @  [facility] [phone] - [name], [place] Phone:  [phone]  Fax:  [phone]

## 2020-06-01 NOTE — Pre-Procedure Instructions (Signed)
[name]  06/01/2020      [id number] [name], [address] [place] [place] [address] Phone: [phone] Fax: [phone]  [id number] [place], [name] [address] Phone: [phone] Fax: [phone]    Your procedure is scheduled on Nov. 22  Report to [facility] Entrance A at 7:55 A.M.  Call this number if you have problems the morning of surgery:  [phone]   Remember:  Do not eat after midnight.  You may drink clear liquids until 6:55 AM .  Clear liquids allowed are:                    Water, Juice (non-citric and without pulp - diabetics please choose diet or no sugar options), Carbonated beverages - (diabetics please choose diet or no sugar options), Clear Tea, Black Coffee only (no creamer, milk or cream including half and half), Plain Jell-O only (diabetics please choose diet or no sugar options), Gatorade (diabetics please choose diet or no sugar options) and Plain Popsicles only                        Enhanced Recovery after Surgery for Orthopedics Enhanced Recovery after Surgery is a protocol used to improve the stress on your body and your recovery after surgery.  Patient Instructions   The night before surgery:  o No food after midnight. ONLY clear liquids after midnight    The day of surgery (if you have diabetes): o  o Drink ONE (1) Gatorade 2 (G2) by __6:55 AM___ the morning of surgery o This drink was given to you during your hospital  pre-op appointment visit.  o Color of the Gatorade may vary. Red is not allowed. o Nothing else to drink after completing the  Gatorade 2 (G2).         If you have questions, please contact your surgeons office.     Take these medicines the morning of surgery with A SIP OF WATER :             Atorvastatin (lipitor)           Cetirizine (zyrtec)            Cyclobenzaprine (flexeril) if needed           Benadryl if needed           Meclizine if needed           Oxybutynin (ditropan)           Oxycodone if needed         7 days prior to surgery STOP taking any Aspirin (unless otherwise instructed by your surgeon), Aleve, Naproxen, Ibuprofen, Motrin, Advil, Goody's, BC's, all herbal medications, fish oil, and all vitamins.   Follow your surgeon's instructions on when to stop Aspirin.  If no instructions were given by your surgeon then you will need to call the office to get those instructions.                      How to Manage Your Diabetes Before and After Surgery  Why is it important to control my blood sugar before and after surgery?  Improving blood sugar levels before and after surgery helps healing and can limit problems.  A way of improving blood sugar control is eating a  healthy diet by: o  Eating less sugar and carbohydrates o  Increasing activity/exercise o  Talking with your doctor about reaching your blood sugar goals  High blood sugars (greater than 180 mg/dL) can raise your risk of infections and slow your recovery, so you will need to focus on controlling your diabetes during the weeks before surgery.  Make sure that the doctor who takes care of your diabetes knows about your planned surgery including the date and location.  How do I manage my blood sugar before surgery?  Check your blood sugar at least 4 times a day, starting 2 days before surgery, to make sure that the level is not too high or low. o Check your blood sugar the morning of your surgery when you wake up and every 2 hours until you get to the Short Stay unit.  If your blood sugar is less than 70 mg/dL, you will need to treat for low blood sugar: o Do not take insulin. o Treat a low blood sugar (less than 70 mg/dL) with  cup of clear juice (cranberry or apple), 4 glucose tablets, OR glucose gel. Recheck blood sugar in 15 minutes after treatment (to  make sure it is greater than 70 mg/dL). If your blood sugar is not greater than 70 mg/dL on recheck, call [phone] o  for further instructions.  Report your blood sugar to the short stay nurse when you get to Short Stay.   If you are admitted to the hospital after surgery: o Your blood sugar will be checked by the staff and you will probably be given insulin after surgery (instead of oral diabetes medicines) to make sure you have good blood sugar levels. o The goal for blood sugar control after surgery is 80-180 mg/dL.      WHAT DO I DO ABOUT MY DIABETES MEDICATION?    Do not take oral diabetes medicines (pills) the morning of surgery. (empaglliflozin/jardiance)   THE NIGHT BEFORE SURGERY, take _______17____ units of ___lantus________insulin.       The day of surgery, do not take other diabetes injectables, including Byetta (exenatide), Bydureon (exenatide ER), Victoza (liraglutide), or Trulicity (dulaglutide).     If your CBG is greater than 220 mg/dL, you may take  of your sliding scale (correction) dose of insulin.  Other Instructions:  Do not take jardiance the day before the surgery      Do not wear jewelry, make-up or nail polish.  Do not wear lotions, powders, or perfumes, or deodorant.  Do not shave 48 hours prior to surgery.  Men may shave face and neck.  Do not bring valuables to the hospital.  [facility] is not responsible for any belongings or valuables.  Contacts, dentures or bridgework may not be worn into surgery.  Leave your suitcase in the car.  After surgery it may be brought to your room.  For patients admitted to the hospital, discharge time will be determined by your treatment team.  Patients discharged the day of surgery will not be allowed to drive home.    Special instructions:   - Preparing For Surgery  Before surgery, you can play an important role. Because skin is not sterile, your skin needs to be as free of germs as  possible. You can reduce the number of germs on your skin by washing with CHG (chlorahexidine gluconate) Soap before surgery.  CHG is an antiseptic cleaner which kills germs and bonds with the skin to continue killing germs even after washing.  Oral Hygiene is also important to reduce your risk of infection.  Remember - BRUSH YOUR TEETH THE MORNING OF SURGERY WITH YOUR REGULAR TOOTHPASTE  Please do not use if you have an allergy to CHG or antibacterial soaps. If your skin becomes reddened/irritated stop using the CHG.  Do not shave (including legs and underarms) for at least 48 hours prior to first CHG shower. It is OK to shave your face.  Please follow these instructions carefully.   1. Shower the NIGHT BEFORE SURGERY and the MORNING OF SURGERY with CHG.   2. If you chose to wash your hair, wash your hair first as usual with your normal shampoo.  3. After you shampoo, rinse your hair and body thoroughly to remove the shampoo.  4. Use CHG as you would any other liquid soap. You can apply CHG directly to the skin and wash gently with a scrungie or a clean washcloth.   5. Apply the CHG Soap to your body ONLY FROM THE NECK DOWN.  Do not use on open wounds or open sores. Avoid contact with your eyes, ears, mouth and genitals (private parts). Wash Face and genitals (private parts)  with your normal soap.  6. Wash thoroughly, paying special attention to the area where your surgery will be performed.  7. Thoroughly rinse your body with warm water from the neck down.  8. DO NOT shower/wash with your normal soap after using and rinsing off the CHG Soap.  9. Pat yourself dry with a CLEAN TOWEL.  10. Wear CLEAN PAJAMAS to bed the night before surgery, wear comfortable clothes the morning of surgery  11. Place CLEAN SHEETS on your bed the night of your first shower and DO NOT SLEEP WITH PETS.    Day of Surgery:  Do not apply any deodorants/lotions.  Please wear clean clothes to the  hospital/surgery center.   Remember to brush your teeth WITH YOUR REGULAR TOOTHPASTE.    Please read over the following fact sheets that you were given.

## 2020-06-02 ENCOUNTER — Other Ambulatory Visit: Payer: Self-pay

## 2020-06-02 ENCOUNTER — Encounter (HOSPITAL_COMMUNITY)
Admission: RE | Admit: 2020-06-02 | Discharge: 2020-06-02 | Disposition: A | Discharge status: Home / Self Care | Payer: Medicaid Other | Source: Ambulatory Visit | Attending: Orthopaedic Surgery | Admitting: Orthopaedic Surgery

## 2020-06-02 ENCOUNTER — Encounter (HOSPITAL_COMMUNITY): Payer: Self-pay

## 2020-06-02 ENCOUNTER — Other Ambulatory Visit (HOSPITAL_COMMUNITY)
Admission: RE | Admit: 2020-06-02 | Discharge: 2020-06-02 | Disposition: A | Discharge status: Home / Self Care | Payer: Medicaid Other | Source: Ambulatory Visit | Attending: Orthopaedic Surgery | Admitting: Orthopaedic Surgery

## 2020-06-02 ENCOUNTER — Ambulatory Visit (HOSPITAL_COMMUNITY)
Admission: RE | Admit: 2020-06-02 | Discharge: 2020-06-02 | Disposition: A | Discharge status: Home / Self Care | Payer: Medicaid Other | Source: Ambulatory Visit | Attending: Physician Assistant | Admitting: Physician Assistant

## 2020-06-02 DIAGNOSIS — M1712 Unilateral primary osteoarthritis, left knee: Secondary | ICD-10-CM

## 2020-06-02 DIAGNOSIS — Z20822 Contact with and (suspected) exposure to covid-19: Secondary | ICD-10-CM | POA: Diagnosis not present

## 2020-06-02 DIAGNOSIS — Z01818 Encounter for other preprocedural examination: Secondary | ICD-10-CM | POA: Diagnosis not present

## 2020-06-02 HISTORY — DX: Fibromyalgia: M79.7

## 2020-06-02 LAB — TYPE AND SCREEN
ABO/RH(D): A POS
Antibody Screen: NEGATIVE

## 2020-06-02 LAB — URINALYSIS, ROUTINE W REFLEX MICROSCOPIC
Bilirubin Urine: NEGATIVE
Glucose, UA: >=500 mg/dL — AB
Hgb urine dipstick: NEGATIVE
Ketones, ur: NEGATIVE mg/dL
Leukocytes,Ua: NEGATIVE
Nitrite: NEGATIVE
Protein, ur: NEGATIVE mg/dL
Specific Gravity, Urine: 1.039 — ABNORMAL HIGH (ref 1.005–1.030)
pH: 6 (ref 5.0–8.0)

## 2020-06-02 LAB — COMPREHENSIVE METABOLIC PANEL
ALT: 20 U/L (ref 0–44)
AST: 26 U/L (ref 15–41)
Albumin: 4 g/dL (ref 3.5–5.0)
Alkaline Phosphatase: 105 U/L (ref 38–126)
Anion gap: 10 (ref 5–15)
BUN: 7 mg/dL (ref 6–20)
CO2: 26 mmol/L (ref 22–32)
Calcium: 9.6 mg/dL (ref 8.9–10.3)
Chloride: 104 mmol/L (ref 98–111)
Creatinine, Ser: 0.74 mg/dL (ref 0.44–1.00)
GFR, Estimated: >60 mL/min (ref >60)
Glucose, Bld: 149 mg/dL — ABNORMAL HIGH (ref 70–99)
Potassium: 3.2 mmol/L — ABNORMAL LOW (ref 3.5–5.1)
Sodium: 140 mmol/L (ref 135–145)
Total Bilirubin: 0.6 mg/dL (ref 0.3–1.2)
Total Protein: 7.5 g/dL (ref 6.5–8.1)

## 2020-06-02 LAB — PROTIME-INR
INR: 1 (ref 0.8–1.2)
Prothrombin Time: 13.2 seconds (ref 11.4–15.2)

## 2020-06-02 LAB — CBC WITH DIFFERENTIAL/PLATELET
Abs Immature Granulocytes: 0.02 10*3/uL (ref 0.00–0.07)
Basophils Absolute: 0.1 10*3/uL (ref 0.0–0.1)
Basophils Relative: 1 %
Eosinophils Absolute: 0.1 10*3/uL (ref 0.0–0.5)
Eosinophils Relative: 1 %
HCT: 50.8 % — ABNORMAL HIGH (ref 36.0–46.0)
Hemoglobin: 16.2 g/dL — ABNORMAL HIGH (ref 12.0–15.0)
Immature Granulocytes: 0 %
Lymphocytes Relative: 30 %
Lymphs Abs: 3.2 10*3/uL (ref 0.7–4.0)
MCH: 28.2 pg (ref 26.0–34.0)
MCHC: 31.9 g/dL (ref 30.0–36.0)
MCV: 88.3 fL (ref 80.0–100.0)
Monocytes Absolute: 0.5 10*3/uL (ref 0.1–1.0)
Monocytes Relative: 4 %
Neutro Abs: 6.6 10*3/uL (ref 1.7–7.7)
Neutrophils Relative %: 64 %
Platelets: 322 10*3/uL (ref 150–400)
RBC: 5.75 MIL/uL — ABNORMAL HIGH (ref 3.87–5.11)
RDW: 13.5 % (ref 11.5–15.5)
WBC: 10.5 10*3/uL (ref 4.0–10.5)
nRBC: 0 % (ref 0.0–0.2)

## 2020-06-02 LAB — SARS CORONAVIRUS 2 (TAT 6-24 HRS): SARS Coronavirus 2: NEGATIVE

## 2020-06-02 LAB — SURGICAL PCR SCREEN
MRSA, PCR: NEGATIVE
Staphylococcus aureus: NEGATIVE

## 2020-06-02 LAB — APTT: aPTT: 26 seconds (ref 24–36)

## 2020-06-02 LAB — GLUCOSE, CAPILLARY: Glucose-Capillary: 175 mg/dL — ABNORMAL HIGH (ref 70–99)

## 2020-06-02 NOTE — Progress Notes (Signed)
PCP - [name] @ [facility] Cardiologist - na   Chest x-ray - 06/02/20 EKG - 06/02/20 Stress Test - na ECHO - na Cardiac Cath - na  Sleep Study - na   Fasting Blood Sugar - 40-90 Checks Blood Sugar __2___ times a day  Blood Thinner Instructions: na Aspirin Instructions: na  ERAS Protcol -yes PRE-SURGERY water given  COVID TEST- 06/02/20   Anesthesia review:   Patient denies shortness of breath, fever, cough and chest pain at PAT appointment   All instructions explained to the patient, with a verbal understanding of the material. Patient agrees to go over the instructions while at home for a better understanding. Patient also instructed to self quarantine after being tested for COVID-19. The opportunity to ask questions was provided.

## 2020-06-03 ENCOUNTER — Other Ambulatory Visit: Payer: Self-pay | Admitting: Physician Assistant

## 2020-06-03 ENCOUNTER — Telehealth: Payer: Self-pay

## 2020-06-03 MED ORDER — SULFAMETHOXAZOLE-TRIMETHOPRIM 800-160 MG PO TABS: 1.0000 | ORAL_TABLET | Freq: Two times a day (BID) | ORAL | 0 refills | Status: DC

## 2020-06-03 MED ORDER — ONDANSETRON HCL 4 MG PO TABS: 4.0000 mg | ORAL_TABLET | Freq: Three times a day (TID) | ORAL | 0 refills | Status: DC | PRN

## 2020-06-03 MED ORDER — CEFAZOLIN SODIUM-DEXTROSE 2-4 GM/100ML-% IV SOLN
2.0000 g | INTRAVENOUS | Status: DC
  Filled 2020-06-03: qty 100

## 2020-06-03 MED ORDER — OXYCODONE-ACETAMINOPHEN 5-325 MG PO TABS
1.0000 | ORAL_TABLET | Freq: Four times a day (QID) | ORAL | 0 refills | Status: DC | PRN
Start: 2020-06-03 — End: 2020-06-08

## 2020-06-03 MED ORDER — METHOCARBAMOL 500 MG PO TABS: 500.0000 mg | ORAL_TABLET | Freq: Two times a day (BID) | ORAL | 0 refills | Status: DC | PRN

## 2020-06-03 MED ORDER — BUPIVACAINE LIPOSOME 1.3 % IJ SUSP
20.0000 mL | Freq: Once | INTRAMUSCULAR | Status: DC
  Filled 2020-06-03: qty 20

## 2020-06-03 MED ORDER — ASPIRIN EC 81 MG PO TBEC: 81.0000 mg | DELAYED_RELEASE_TABLET | Freq: Two times a day (BID) | ORAL | 0 refills | Status: DC

## 2020-06-03 MED ORDER — TRANEXAMIC ACID 1000 MG/10ML IV SOLN
2000.0000 mg | INTRAVENOUS | Status: AC
  Administered 2020-06-06: 2000 mg via TOPICAL
  Filled 2020-06-03: qty 20

## 2020-06-03 MED ORDER — POTASSIUM CHLORIDE ER 10 MEQ PO TBCR: EXTENDED_RELEASE_TABLET | ORAL | 0 refills | Status: DC

## 2020-06-03 MED ORDER — DOCUSATE SODIUM 100 MG PO CAPS: 100.0000 mg | ORAL_CAPSULE | Freq: Every day | ORAL | 2 refills | Status: DC | PRN

## 2020-06-03 NOTE — Telephone Encounter (Signed)
Called patient to let her know meds were called into pharm and should be taken after SU.

## 2020-06-03 NOTE — Progress Notes (Signed)
Can you let patient know that her potassium is low and that I have called in supplements that she needs to pick up asap and start

## 2020-06-03 NOTE — Progress Notes (Signed)
Lots of glucose in urine.  Went back and looked at A1C from 10/5 and it is 7.9.  let me know if still a go and I'll call in kdur for hypokalemia

## 2020-06-05 DIAGNOSIS — M1990 Unspecified osteoarthritis, unspecified site: Secondary | ICD-10-CM | POA: Insufficient documentation

## 2020-06-05 DIAGNOSIS — M1712 Unilateral primary osteoarthritis, left knee: Secondary | ICD-10-CM

## 2020-06-06 ENCOUNTER — Encounter (HOSPITAL_COMMUNITY)
Admission: RE | Disposition: A | Discharge status: Home / Self Care | Payer: Self-pay | Source: Home / Self Care | Attending: Orthopaedic Surgery

## 2020-06-06 ENCOUNTER — Ambulatory Visit (HOSPITAL_COMMUNITY): Payer: Medicaid Other | Admitting: Certified Registered Nurse Anesthetist

## 2020-06-06 ENCOUNTER — Observation Stay (HOSPITAL_COMMUNITY): Payer: Medicaid Other

## 2020-06-06 ENCOUNTER — Other Ambulatory Visit: Payer: Self-pay

## 2020-06-06 ENCOUNTER — Encounter (HOSPITAL_COMMUNITY): Payer: Self-pay | Admitting: Orthopaedic Surgery

## 2020-06-06 ENCOUNTER — Observation Stay (HOSPITAL_COMMUNITY)
Admission: RE | Admit: 2020-06-06 | Discharge: 2020-06-07 | Disposition: A | Discharge status: Home / Self Care | Payer: Medicaid Other | Attending: Orthopaedic Surgery | Admitting: Orthopaedic Surgery

## 2020-06-06 DIAGNOSIS — Z7982 Long term (current) use of aspirin: Secondary | ICD-10-CM | POA: Insufficient documentation

## 2020-06-06 DIAGNOSIS — Z79899 Other long term (current) drug therapy: Secondary | ICD-10-CM | POA: Diagnosis not present

## 2020-06-06 DIAGNOSIS — M1712 Unilateral primary osteoarthritis, left knee: Secondary | ICD-10-CM

## 2020-06-06 DIAGNOSIS — E119 Type 2 diabetes mellitus without complications: Secondary | ICD-10-CM | POA: Diagnosis not present

## 2020-06-06 DIAGNOSIS — Z794 Long term (current) use of insulin: Secondary | ICD-10-CM | POA: Insufficient documentation

## 2020-06-06 DIAGNOSIS — M1990 Unspecified osteoarthritis, unspecified site: Secondary | ICD-10-CM

## 2020-06-06 DIAGNOSIS — M25562 Pain in left knee: Secondary | ICD-10-CM | POA: Diagnosis present

## 2020-06-06 DIAGNOSIS — Z96652 Presence of left artificial knee joint: Secondary | ICD-10-CM

## 2020-06-06 HISTORY — PX: TOTAL KNEE ARTHROPLASTY: SHX125

## 2020-06-06 LAB — GLUCOSE, CAPILLARY
Glucose-Capillary: 131 mg/dL — ABNORMAL HIGH (ref 70–99)
Glucose-Capillary: 112 mg/dL — ABNORMAL HIGH (ref 70–99)
Glucose-Capillary: 213 mg/dL — ABNORMAL HIGH (ref 70–99)
Glucose-Capillary: 281 mg/dL — ABNORMAL HIGH (ref 70–99)

## 2020-06-06 LAB — ABO/RH: ABO/RH(D): A POS

## 2020-06-06 SURGERY — ARTHROPLASTY, KNEE, TOTAL
Anesthesia: Regional | Site: Knee | Laterality: Left

## 2020-06-06 MED ORDER — LACTATED RINGERS IV SOLN: INTRAVENOUS | Status: DC

## 2020-06-06 MED ORDER — HYDROMORPHONE HCL 1 MG/ML IJ SOLN
0.5000 mg | INTRAMUSCULAR | Status: DC | PRN
  Administered 2020-06-06: 1 mg via INTRAVENOUS
  Filled 2020-06-06: qty 1

## 2020-06-06 MED ORDER — PROPOFOL 500 MG/50ML IV EMUL
INTRAVENOUS | Status: DC | PRN
  Administered 2020-06-06: 50 ug/kg/min via INTRAVENOUS

## 2020-06-06 MED ORDER — SODIUM CHLORIDE 0.9% FLUSH
INTRAVENOUS | Status: DC | PRN
  Administered 2020-06-06 (×2): 10 mL

## 2020-06-06 MED ORDER — SODIUM CHLORIDE 0.9 % IV SOLN: INTRAVENOUS | Status: DC

## 2020-06-06 MED ORDER — INSULIN ASPART 100 UNIT/ML FLEXPEN
0.0000 [IU] | PEN_INJECTOR | Freq: Three times a day (TID) | SUBCUTANEOUS | Status: DC
Start: 2020-06-06 — End: 2020-06-06

## 2020-06-06 MED ORDER — MECLIZINE HCL 12.5 MG PO TABS
12.5000 mg | ORAL_TABLET | Freq: Three times a day (TID) | ORAL | Status: DC | PRN
  Filled 2020-06-06: qty 1

## 2020-06-06 MED ORDER — POVIDONE-IODINE 10 % EX SWAB
2.0000 "application " | Freq: Once | CUTANEOUS | Status: AC
  Administered 2020-06-06: 2 via TOPICAL

## 2020-06-06 MED ORDER — OXYCODONE HCL 5 MG PO TABS: 10.0000 mg | ORAL_TABLET | ORAL | Status: DC | PRN

## 2020-06-06 MED ORDER — CEFAZOLIN SODIUM-DEXTROSE 2-4 GM/100ML-% IV SOLN
2.0000 g | Freq: Four times a day (QID) | INTRAVENOUS | Status: AC
  Administered 2020-06-06 (×2): 2 g via INTRAVENOUS
  Filled 2020-06-06 (×2): qty 100

## 2020-06-06 MED ORDER — FENTANYL CITRATE (PF) 100 MCG/2ML IJ SOLN
25.0000 ug | INTRAMUSCULAR | Status: DC | PRN
  Administered 2020-06-06: 50 ug via INTRAVENOUS

## 2020-06-06 MED ORDER — VANCOMYCIN HCL 1000 MG IV SOLR
INTRAVENOUS | Status: AC
  Filled 2020-06-06: qty 1000

## 2020-06-06 MED ORDER — MIDAZOLAM HCL 2 MG/2ML IJ SOLN: 2.0000 mg | Freq: Once | INTRAMUSCULAR | Status: AC

## 2020-06-06 MED ORDER — MIDAZOLAM HCL 2 MG/2ML IJ SOLN
INTRAMUSCULAR | Status: AC
  Administered 2020-06-06: 2 mg via INTRAVENOUS
  Filled 2020-06-06: qty 2

## 2020-06-06 MED ORDER — ASPIRIN 81 MG PO CHEW
81.0000 mg | CHEWABLE_TABLET | Freq: Two times a day (BID) | ORAL | Status: DC
  Administered 2020-06-06 – 2020-06-07 (×2): 81 mg via ORAL
  Filled 2020-06-06 (×2): qty 1

## 2020-06-06 MED ORDER — SORBITOL 70 % SOLN
30.0000 mL | Freq: Every day | Status: DC | PRN
  Filled 2020-06-06: qty 30

## 2020-06-06 MED ORDER — PHENOL 1.4 % MT LIQD: 1.0000 | OROMUCOSAL | Status: DC | PRN

## 2020-06-06 MED ORDER — POLYETHYLENE GLYCOL 3350 17 G PO PACK: 17.0000 g | PACK | Freq: Every day | ORAL | Status: DC | PRN

## 2020-06-06 MED ORDER — SODIUM CHLORIDE 0.9 % IR SOLN
Status: DC | PRN
  Administered 2020-06-06: 3000 mL

## 2020-06-06 MED ORDER — KETOROLAC TROMETHAMINE 15 MG/ML IJ SOLN
15.0000 mg | Freq: Four times a day (QID) | INTRAMUSCULAR | Status: AC
  Administered 2020-06-06 – 2020-06-07 (×4): 15 mg via INTRAVENOUS
  Filled 2020-06-06 (×4): qty 1

## 2020-06-06 MED ORDER — PHENYLEPHRINE 40 MCG/ML (10ML) SYRINGE FOR IV PUSH (FOR BLOOD PRESSURE SUPPORT)
PREFILLED_SYRINGE | INTRAVENOUS | Status: AC
  Filled 2020-06-06: qty 10

## 2020-06-06 MED ORDER — METOCLOPRAMIDE HCL 5 MG PO TABS: 5.0000 mg | ORAL_TABLET | Freq: Three times a day (TID) | ORAL | Status: DC | PRN

## 2020-06-06 MED ORDER — OXYCODONE HCL 5 MG PO TABS
5.0000 mg | ORAL_TABLET | ORAL | Status: DC | PRN
  Administered 2020-06-06 – 2020-06-07 (×2): 10 mg via ORAL
  Filled 2020-06-06 (×2): qty 2

## 2020-06-06 MED ORDER — DEXAMETHASONE SODIUM PHOSPHATE 10 MG/ML IJ SOLN
10.0000 mg | Freq: Once | INTRAMUSCULAR | Status: AC
  Administered 2020-06-07: 10 mg via INTRAVENOUS
  Filled 2020-06-06: qty 1

## 2020-06-06 MED ORDER — LIRAGLUTIDE 18 MG/3ML ~~LOC~~ SOPN: 1.2000 mg | PEN_INJECTOR | Freq: Every day | SUBCUTANEOUS | Status: DC

## 2020-06-06 MED ORDER — EMPAGLIFLOZIN 10 MG PO TABS
10.0000 mg | ORAL_TABLET | Freq: Every day | ORAL | Status: DC
  Filled 2020-06-06: qty 1

## 2020-06-06 MED ORDER — 0.9 % SODIUM CHLORIDE (POUR BTL) OPTIME
TOPICAL | Status: DC | PRN
  Administered 2020-06-06: 1000 mL

## 2020-06-06 MED ORDER — ONDANSETRON HCL 4 MG PO TABS: 4.0000 mg | ORAL_TABLET | Freq: Four times a day (QID) | ORAL | Status: DC | PRN

## 2020-06-06 MED ORDER — TRANEXAMIC ACID-NACL 1000-0.7 MG/100ML-% IV SOLN
1000.0000 mg | Freq: Once | INTRAVENOUS | Status: AC
  Administered 2020-06-06: 1000 mg via INTRAVENOUS
  Filled 2020-06-06: qty 100

## 2020-06-06 MED ORDER — INSULIN ASPART 100 UNIT/ML ~~LOC~~ SOLN
0.0000 [IU] | Freq: Three times a day (TID) | SUBCUTANEOUS | Status: DC
  Administered 2020-06-06: 7 [IU] via SUBCUTANEOUS
  Administered 2020-06-07: 4 [IU] via SUBCUTANEOUS

## 2020-06-06 MED ORDER — BUPIVACAINE IN DEXTROSE 0.75-8.25 % IT SOLN
INTRATHECAL | Status: DC | PRN
  Administered 2020-06-06: 1.6 mL via INTRATHECAL

## 2020-06-06 MED ORDER — EMPAGLIFLOZIN 10 MG PO TABS
10.0000 mg | ORAL_TABLET | Freq: Every day | ORAL | Status: DC
  Administered 2020-06-06 – 2020-06-07 (×2): 10 mg via ORAL
  Filled 2020-06-06: qty 1

## 2020-06-06 MED ORDER — ROPIVACAINE HCL 5 MG/ML IJ SOLN
INTRAMUSCULAR | Status: DC | PRN
  Administered 2020-06-06: 30 mL via PERINEURAL

## 2020-06-06 MED ORDER — FENTANYL CITRATE (PF) 100 MCG/2ML IJ SOLN
INTRAMUSCULAR | Status: AC
  Filled 2020-06-06: qty 2

## 2020-06-06 MED ORDER — ONDANSETRON HCL 4 MG/2ML IJ SOLN
INTRAMUSCULAR | Status: DC | PRN
  Administered 2020-06-06: 4 mg via INTRAVENOUS

## 2020-06-06 MED ORDER — DIPHENHYDRAMINE HCL 12.5 MG/5ML PO ELIX
25.0000 mg | ORAL_SOLUTION | ORAL | Status: DC | PRN
  Filled 2020-06-06: qty 10

## 2020-06-06 MED ORDER — BUPIVACAINE HCL (PF) 0.25 % IJ SOLN
INTRAMUSCULAR | Status: AC
  Filled 2020-06-06: qty 30

## 2020-06-06 MED ORDER — IRRISEPT - 450ML BOTTLE WITH 0.05% CHG IN STERILE WATER, USP 99.95% OPTIME
TOPICAL | Status: DC | PRN
  Administered 2020-06-06: 450 mL via TOPICAL

## 2020-06-06 MED ORDER — TRANEXAMIC ACID-NACL 1000-0.7 MG/100ML-% IV SOLN
1000.0000 mg | INTRAVENOUS | Status: AC
  Administered 2020-06-06: 1000 mg via INTRAVENOUS
  Filled 2020-06-06: qty 100

## 2020-06-06 MED ORDER — FENTANYL CITRATE (PF) 250 MCG/5ML IJ SOLN
INTRAMUSCULAR | Status: AC
  Filled 2020-06-06: qty 5

## 2020-06-06 MED ORDER — VANCOMYCIN HCL 1000 MG IV SOLR
INTRAVENOUS | Status: DC | PRN
  Administered 2020-06-06: 1000 mg via TOPICAL

## 2020-06-06 MED ORDER — OXYBUTYNIN CHLORIDE ER 10 MG PO TB24
10.0000 mg | ORAL_TABLET | Freq: Every day | ORAL | Status: DC
  Administered 2020-06-06 – 2020-06-07 (×2): 10 mg via ORAL
  Filled 2020-06-06 (×2): qty 1

## 2020-06-06 MED ORDER — CHLORHEXIDINE GLUCONATE 0.12 % MT SOLN
15.0000 mL | Freq: Once | OROMUCOSAL | Status: AC
  Administered 2020-06-06: 15 mL via OROMUCOSAL
  Filled 2020-06-06: qty 15

## 2020-06-06 MED ORDER — PROPOFOL 10 MG/ML IV BOLUS
INTRAVENOUS | Status: AC
  Filled 2020-06-06: qty 20

## 2020-06-06 MED ORDER — ONDANSETRON HCL 4 MG/2ML IJ SOLN: 4.0000 mg | Freq: Four times a day (QID) | INTRAMUSCULAR | Status: DC | PRN

## 2020-06-06 MED ORDER — OXYCODONE HCL 5 MG/5ML PO SOLN: 5.0000 mg | Freq: Once | ORAL | Status: DC | PRN

## 2020-06-06 MED ORDER — INSULIN ASPART 100 UNIT/ML ~~LOC~~ SOLN
0.0000 [IU] | Freq: Every day | SUBCUTANEOUS | Status: DC
  Administered 2020-06-06: 3 [IU] via SUBCUTANEOUS

## 2020-06-06 MED ORDER — OXYCODONE HCL ER 10 MG PO T12A
10.0000 mg | EXTENDED_RELEASE_TABLET | Freq: Two times a day (BID) | ORAL | Status: DC
  Administered 2020-06-06 – 2020-06-07 (×3): 10 mg via ORAL
  Filled 2020-06-06 (×3): qty 1

## 2020-06-06 MED ORDER — ROPINIROLE HCL 1 MG PO TABS
0.5000 mg | ORAL_TABLET | Freq: Every day | ORAL | Status: DC
  Administered 2020-06-06: 0.5 mg via ORAL
  Filled 2020-06-06: qty 1

## 2020-06-06 MED ORDER — ACETAMINOPHEN 10 MG/ML IV SOLN: 1000.0000 mg | Freq: Once | INTRAVENOUS | Status: DC | PRN

## 2020-06-06 MED ORDER — ALUM & MAG HYDROXIDE-SIMETH 200-200-20 MG/5ML PO SUSP: 30.0000 mL | ORAL | Status: DC | PRN

## 2020-06-06 MED ORDER — DEXAMETHASONE SODIUM PHOSPHATE 10 MG/ML IJ SOLN
INTRAMUSCULAR | Status: DC | PRN
  Administered 2020-06-06: 10 mg via INTRAVENOUS

## 2020-06-06 MED ORDER — PROMETHAZINE HCL 25 MG/ML IJ SOLN: 6.2500 mg | INTRAMUSCULAR | Status: DC | PRN

## 2020-06-06 MED ORDER — CEFAZOLIN SODIUM-DEXTROSE 2-3 GM-%(50ML) IV SOLR
INTRAVENOUS | Status: DC | PRN
  Administered 2020-06-06: 2 g via INTRAVENOUS

## 2020-06-06 MED ORDER — PHENYLEPHRINE HCL (PRESSORS) 10 MG/ML IV SOLN
INTRAVENOUS | Status: DC | PRN
Start: 2020-06-06 — End: 2020-06-06
  Administered 2020-06-06 (×4): 80 ug via INTRAVENOUS

## 2020-06-06 MED ORDER — SODIUM CHLORIDE 0.9 % IV SOLN: INTRAVENOUS | Status: DC | PRN

## 2020-06-06 MED ORDER — ORAL CARE MOUTH RINSE: 15.0000 mL | Freq: Once | OROMUCOSAL | Status: AC

## 2020-06-06 MED ORDER — METHOCARBAMOL 1000 MG/10ML IJ SOLN
500.0000 mg | Freq: Four times a day (QID) | INTRAVENOUS | Status: DC | PRN
  Filled 2020-06-06: qty 5

## 2020-06-06 MED ORDER — OXYCODONE HCL 5 MG PO TABS: 5.0000 mg | ORAL_TABLET | Freq: Once | ORAL | Status: DC | PRN

## 2020-06-06 MED ORDER — DOCUSATE SODIUM 100 MG PO CAPS
100.0000 mg | ORAL_CAPSULE | Freq: Two times a day (BID) | ORAL | Status: DC
  Administered 2020-06-06 – 2020-06-07 (×3): 100 mg via ORAL
  Filled 2020-06-06 (×3): qty 1

## 2020-06-06 MED ORDER — MAGNESIUM CITRATE PO SOLN: 1.0000 | Freq: Once | ORAL | Status: DC | PRN

## 2020-06-06 MED ORDER — METOCLOPRAMIDE HCL 5 MG/ML IJ SOLN: 5.0000 mg | Freq: Three times a day (TID) | INTRAMUSCULAR | Status: DC | PRN

## 2020-06-06 MED ORDER — METHOCARBAMOL 500 MG PO TABS
500.0000 mg | ORAL_TABLET | Freq: Four times a day (QID) | ORAL | Status: DC | PRN
  Administered 2020-06-06 – 2020-06-07 (×3): 500 mg via ORAL
  Filled 2020-06-06 (×3): qty 1

## 2020-06-06 MED ORDER — ACETAMINOPHEN 500 MG PO TABS
1000.0000 mg | ORAL_TABLET | Freq: Four times a day (QID) | ORAL | Status: AC
  Administered 2020-06-06 – 2020-06-07 (×4): 1000 mg via ORAL
  Filled 2020-06-06 (×4): qty 2

## 2020-06-06 MED ORDER — FENTANYL CITRATE (PF) 100 MCG/2ML IJ SOLN
INTRAMUSCULAR | Status: DC | PRN
  Administered 2020-06-06 (×2): 50 ug via INTRAVENOUS

## 2020-06-06 MED ORDER — ATORVASTATIN CALCIUM 10 MG PO TABS
20.0000 mg | ORAL_TABLET | Freq: Every day | ORAL | Status: DC
  Administered 2020-06-06 – 2020-06-07 (×2): 20 mg via ORAL
  Filled 2020-06-06 (×2): qty 2

## 2020-06-06 MED ORDER — INSULIN GLARGINE 100 UNIT/ML ~~LOC~~ SOLN
35.0000 [IU] | Freq: Every day | SUBCUTANEOUS | Status: DC
  Administered 2020-06-06: 35 [IU] via SUBCUTANEOUS
  Filled 2020-06-06 (×2): qty 0.35

## 2020-06-06 MED ORDER — MENTHOL 3 MG MT LOZG: 1.0000 | LOZENGE | OROMUCOSAL | Status: DC | PRN

## 2020-06-06 MED ORDER — BUPIVACAINE HCL (PF) 0.25 % IJ SOLN
INTRAMUSCULAR | Status: DC | PRN
  Administered 2020-06-06: 20 mL

## 2020-06-06 MED ORDER — ACETAMINOPHEN 325 MG PO TABS: 325.0000 mg | ORAL_TABLET | Freq: Four times a day (QID) | ORAL | Status: DC | PRN

## 2020-06-06 SURGICAL SUPPLY — 76 items
ALCOHOL 70% 16 OZ (MISCELLANEOUS) ×2 IMPLANT
BAG DECANTER FOR FLEXI CONT (MISCELLANEOUS) ×2 IMPLANT
BANDAGE ESMARK 6X9 LF (GAUZE/BANDAGES/DRESSINGS) IMPLANT
BLADE SAG 18X100X1.27 (BLADE) ×2 IMPLANT
BNDG ESMARK 6X9 LF (GAUZE/BANDAGES/DRESSINGS)
BOWL SMART MIX CTS (DISPOSABLE) ×2 IMPLANT
CLSR STERI-STRIP ANTIMIC 1/2X4 (GAUZE/BANDAGES/DRESSINGS) ×4 IMPLANT
COMP FEM CR PERS SZ6 LT (Joint) ×2 IMPLANT
COMPONENT FEM CR PERS SZ6 LT (Joint) ×1 IMPLANT
COMPONENT TIB 2 PEG SZ E LT KN (Knees) ×1 IMPLANT
COOLER ICEMAN CLASSIC (MISCELLANEOUS) ×2 IMPLANT
COVER SURGICAL LIGHT HANDLE (MISCELLANEOUS) ×2 IMPLANT
COVER WAND RF STERILE (DRAPES) IMPLANT
CUFF TOURN SGL QUICK 34 (TOURNIQUET CUFF) ×2
CUFF TOURN SGL QUICK 42 (TOURNIQUET CUFF) IMPLANT
CUFF TRNQT CYL 34X4.125X (TOURNIQUET CUFF) ×1 IMPLANT
DERMABOND ADVANCED (GAUZE/BANDAGES/DRESSINGS) ×1
DERMABOND ADVANCED .7 DNX12 (GAUZE/BANDAGES/DRESSINGS) ×1 IMPLANT
DRAPE EXTREMITY T 121X128X90 (DISPOSABLE) ×2 IMPLANT
DRAPE HALF SHEET 40X57 (DRAPES) ×2 IMPLANT
DRAPE INCISE IOBAN 66X45 STRL (DRAPES) IMPLANT
DRAPE ORTHO SPLIT 77X108 STRL (DRAPES) ×4
DRAPE POUCH INSTRU U-SHP 10X18 (DRAPES) ×2 IMPLANT
DRAPE SURG ORHT 6 SPLT 77X108 (DRAPES) ×2 IMPLANT
DRAPE U-SHAPE 47X51 STRL (DRAPES) ×4 IMPLANT
DRSG AQUACEL AG ADV 3.5X10 (GAUZE/BANDAGES/DRESSINGS) ×2 IMPLANT
DURAPREP 26ML APPLICATOR (WOUND CARE) ×6 IMPLANT
ELECT CAUTERY BLADE 6.4 (BLADE) ×2 IMPLANT
ELECT REM PT RETURN 9FT ADLT (ELECTROSURGICAL) ×2
ELECTRODE REM PT RTRN 9FT ADLT (ELECTROSURGICAL) ×1 IMPLANT
GLOVE BIOGEL PI IND STRL 7.0 (GLOVE) ×1 IMPLANT
GLOVE BIOGEL PI INDICATOR 7.0 (GLOVE) ×1
GLOVE ECLIPSE 7.0 STRL STRAW (GLOVE) ×6 IMPLANT
GLOVE SKINSENSE NS SZ7.5 (GLOVE) ×3
GLOVE SKINSENSE STRL SZ7.5 (GLOVE) ×3 IMPLANT
GLOVE SURG SYN 7.5  E (GLOVE) ×8
GLOVE SURG SYN 7.5 E (GLOVE) ×4 IMPLANT
GOWN STRL REIN XL XLG (GOWN DISPOSABLE) ×2 IMPLANT
GOWN STRL REUS W/ TWL LRG LVL3 (GOWN DISPOSABLE) ×1 IMPLANT
GOWN STRL REUS W/TWL LRG LVL3 (GOWN DISPOSABLE) ×2
HANDPIECE INTERPULSE COAX TIP (DISPOSABLE) ×2
HDLS TROCR DRIL PIN KNEE 75 (PIN) ×2
HOOD PEEL AWAY FLYTE STAYCOOL (MISCELLANEOUS) ×4 IMPLANT
IMPL PATELLA METAL SZ32X10 (Joint) ×2 IMPLANT
JET LAVAGE IRRISEPT WOUND (IRRIGATION / IRRIGATOR) ×2
KIT BASIN OR (CUSTOM PROCEDURE TRAY) ×2 IMPLANT
KIT TURNOVER KIT B (KITS) ×2 IMPLANT
LAVAGE JET IRRISEPT WOUND (IRRIGATION / IRRIGATOR) ×1 IMPLANT
MANIFOLD NEPTUNE II (INSTRUMENTS) ×2 IMPLANT
MARKER SKIN DUAL TIP RULER LAB (MISCELLANEOUS) ×2 IMPLANT
NEEDLE SPNL 18GX3.5 QUINCKE PK (NEEDLE) ×4 IMPLANT
NS IRRIG 1000ML POUR BTL (IV SOLUTION) ×2 IMPLANT
PACK TOTAL JOINT (CUSTOM PROCEDURE TRAY) ×2 IMPLANT
PAD ARMBOARD 7.5X6 YLW CONV (MISCELLANEOUS) ×4 IMPLANT
PAD COLD SHLDR WRAP-ON (PAD) ×2 IMPLANT
PIN DRILL HDLS TROCAR 75 4PK (PIN) ×1 IMPLANT
SAW OSC TIP CART 19.5X105X1.3 (SAW) ×2 IMPLANT
SET HNDPC FAN SPRY TIP SCT (DISPOSABLE) ×1 IMPLANT
STAPLER VISISTAT 35W (STAPLE) IMPLANT
STEM TIBIAL SZ6-7 EF 12 LT (Knees) ×2 IMPLANT
SUCTION FRAZIER HANDLE 10FR (MISCELLANEOUS) ×2
SUCTION TUBE FRAZIER 10FR DISP (MISCELLANEOUS) ×1 IMPLANT
SUT ETHILON 2 0 FS 18 (SUTURE) IMPLANT
SUT MNCRL AB 4-0 PS2 18 (SUTURE) IMPLANT
SUT VIC AB 0 CT1 27 (SUTURE) ×4
SUT VIC AB 0 CT1 27XBRD ANBCTR (SUTURE) ×2 IMPLANT
SUT VIC AB 1 CTX 27 (SUTURE) ×6 IMPLANT
SUT VIC AB 2-0 CT1 27 (SUTURE) ×8
SUT VIC AB 2-0 CT1 TAPERPNT 27 (SUTURE) ×4 IMPLANT
SYR 50ML LL SCALE MARK (SYRINGE) ×4 IMPLANT
TIBIA POR 2 PEG SZ E L KNEE (Knees) ×2 IMPLANT
TOWEL GREEN STERILE (TOWEL DISPOSABLE) ×2 IMPLANT
TOWEL GREEN STERILE FF (TOWEL DISPOSABLE) ×2 IMPLANT
TRAY CATH 16FR W/PLASTIC CATH (SET/KITS/TRAYS/PACK) IMPLANT
UNDERPAD 30X36 HEAVY ABSORB (UNDERPADS AND DIAPERS) ×2 IMPLANT
WRAP KNEE MAXI GEL POST OP (GAUZE/BANDAGES/DRESSINGS) ×2 IMPLANT

## 2020-06-06 NOTE — Progress Notes (Signed)
Orthopedic Tech Progress Note Patient Details:  [name] 18-Aug-1960 [id number]  CPM Left Knee CPM Left Knee: On Left Knee Flexion (Degrees): 90 Left Knee Extension (Degrees): 0 Additional Comments: adde ice  Post Interventions Patient Tolerated: Well Instructions Provided: Care of device  [name] 06/06/2020, 12:42 PM

## 2020-06-06 NOTE — Progress Notes (Signed)
Pulled Versed and Fentanyl for block.  Gave the 2mg  Versed and Probation officer gave the Fentanyl 2mg .

## 2020-06-06 NOTE — Evaluation (Signed)
Physical Therapy Evaluation Patient Details Name: [name] MRN: [id number] DOB: 05-Apr-1961 Today's Date: 06/06/2020   History of Present Illness  Pt is a 59 y/o female s/p L TKA. PMH includes DM and fibromyalgia.   Clinical Impression  Pt is s/p surgery above with deficits below. Pt requiring min A for bed mobility and min guard A for gait with a RW. Overall tolerated well. Educated about knee precautions. Will continue to follow acutely.     Follow Up Recommendations Follow surgeon's recommendation for DC plan and follow-up therapies    Equipment Recommendations  None recommended by PT    Recommendations for Other Services       Precautions / Restrictions Precautions Precautions: Knee Precaution Booklet Issued: No Precaution Comments: Verbally reviewed knee precautions.  Restrictions Weight Bearing Restrictions: Yes LLE Weight Bearing: Weight bearing as tolerated      Mobility  Bed Mobility Overal bed mobility: Needs Assistance Bed Mobility: Supine to Sit     Supine to sit: Min assist     General bed mobility comments: Min A for LLE assist.     Transfers Overall transfer level: Needs assistance Equipment used: Rolling walker (2 wheeled) Transfers: Sit to/from Stand Sit to Stand: Min guard         General transfer comment: Min guard for safety. Cues for safe hand placement.   Ambulation/Gait Ambulation/Gait assistance: Min guard Gait Distance (Feet): 100 Feet Assistive device: Rolling walker (2 wheeled) Gait Pattern/deviations: Step-to pattern;Step-through pattern;Decreased step length - right;Decreased step length - left;Decreased weight shift to left Gait velocity: Decreased   General Gait Details: Mildly antalgic gait, however, overall steady with use of RW. Cues for sequencing.   Stairs            Wheelchair Mobility    Modified Rankin (Stroke Patients Only)       Balance Overall balance assessment: Needs  assistance Sitting-balance support: No upper extremity supported;Feet supported Sitting balance-Leahy Scale: Fair     Standing balance support: Bilateral upper extremity supported;During functional activity Standing balance-Leahy Scale: Poor Standing balance comment: Reliant on BUE support                              Pertinent Vitals/Pain Pain Assessment: Faces Faces Pain Scale: Hurts even more Pain Location: L knee  Pain Descriptors / Indicators: Aching;Operative site guarding Pain Intervention(s): Limited activity within patient's tolerance;Monitored during session;Repositioned    Home Living Family/patient expects to be discharged to:: Private residence Living Arrangements: Spouse/significant other Available Help at Discharge: Family Type of Home: House Home Access: Ramped entrance     Home Layout: One level Home Equipment: Environmental consultant - 2 wheels;Bedside commode      Prior Function Level of Independence: Independent         Comments: Used knee brace on L knee      Hand Dominance        Extremity/Trunk Assessment   Upper Extremity Assessment Upper Extremity Assessment: Overall WFL for tasks assessed    Lower Extremity Assessment Lower Extremity Assessment: LLE deficits/detail LLE Deficits / Details: Deficits consistent with post op pain and weakness.     Cervical / Trunk Assessment Cervical / Trunk Assessment: Normal  Communication   Communication: No difficulties  Cognition Arousal/Alertness: Awake/alert Behavior During Therapy: WFL for tasks assessed/performed Overall Cognitive Status: Within Functional Limits for tasks assessed  General Comments      Exercises     Assessment/Plan    PT Assessment Patient needs continued PT services  PT Problem List Decreased strength;Decreased activity tolerance;Decreased mobility;Decreased range of motion;Decreased knowledge of use of  DME;Decreased knowledge of precautions;Pain       PT Treatment Interventions Stair training;Gait training;DME instruction;Functional mobility training;Therapeutic activities;Therapeutic exercise;Balance training;Patient/family education    PT Goals (Current goals can be found in the Care Plan section)  Acute Rehab PT Goals Patient Stated Goal: to go home PT Goal Formulation: With patient Time For Goal Achievement: 06/20/20 Potential to Achieve Goals: Good    Frequency 7X/week   Barriers to discharge        Co-evaluation               AM-PAC PT "6 Clicks" Mobility  Outcome Measure Help needed turning from your back to your side while in a flat bed without using bedrails?: None Help needed moving from lying on your back to sitting on the side of a flat bed without using bedrails?: A Little Help needed moving to and from a bed to a chair (including a wheelchair)?: A Little Help needed standing up from a chair using your arms (e.g., wheelchair or bedside chair)?: A Little Help needed to walk in hospital room?: A Little Help needed climbing 3-5 steps with a railing? : A Lot 6 Click Score: 18    End of Session Equipment Utilized During Treatment: Gait belt Activity Tolerance: Patient tolerated treatment well Patient left: in chair;with call bell/phone within reach Nurse Communication: Mobility status PT Visit Diagnosis: Pain;Other abnormalities of gait and mobility (R26.89) Pain - Right/Left: Left Pain - part of body: Knee    Time: 4818-5631 PT Time Calculation (min) (ACUTE ONLY): 23 min   Charges:   PT Evaluation $PT Eval Low Complexity: 1 Low PT Treatments $Gait Training: 8-22 mins        Lou Miner, DPT  Acute Rehabilitation Services  Pager: [phone] Office: [phone]   [name] 06/06/2020, 3:57 PM

## 2020-06-06 NOTE — Anesthesia Preprocedure Evaluation (Addendum)
Anesthesia Evaluation  Patient identified by MRN, date of birth, ID band Patient awake    Reviewed: Allergy & Precautions, NPO status , Patient's Chart, lab work & pertinent test results  Airway Mallampati: III  TM Distance: >3 FB Neck ROM: Full    Dental no notable dental hx.    Pulmonary    Pulmonary exam normal breath sounds clear to auscultation       Cardiovascular negative cardio ROS Normal cardiovascular exam Rhythm:Regular Rate:Normal  ECG: rate 83   Neuro/Psych  Neuromuscular disease negative psych ROS   GI/Hepatic GERD  ,(+)     substance abuse  ,   Endo/Other  diabetes, Insulin Dependent  Renal/GU negative Renal ROS     Musculoskeletal  (+) Arthritis , Fibromyalgia -, narcotic dependentChronic back pain   Abdominal (+) + obese,   Peds  Hematology HLD   Anesthesia Other Findings left knee degenerative joint disease  Reproductive/Obstetrics                           Anesthesia Physical Anesthesia Plan  ASA: III  Anesthesia Plan: Spinal and Regional   Post-op Pain Management:    Induction: Intravenous  PONV Risk Score and Plan: 2 and Ondansetron, Dexamethasone, Propofol infusion, Midazolam and Treatment may vary due to age or medical condition  Airway Management Planned: Simple Face Mask  Additional Equipment:   Intra-op Plan:   Post-operative Plan:   Informed Consent: I have reviewed the patients History and Physical, chart, labs and discussed the procedure including the risks, benefits and alternatives for the proposed anesthesia with the patient or authorized representative who has indicated his/her understanding and acceptance.     Dental advisory given  Plan Discussed with: CRNA  Anesthesia Plan Comments:         Anesthesia Quick Evaluation

## 2020-06-06 NOTE — H&P (Signed)
PREOPERATIVE H&P  Chief Complaint: left knee degenerative joint disease  HPI: [name] is a 59 y.o. female who presents for surgical treatment of left knee degenerative joint disease.  She denies any changes in medical history.  Past Medical History:  Diagnosis Date  . 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 Arthritis   . Diabetes mellitus    oral tx  . Fibromyalgia   . GERD (gastroesophageal reflux disease)   . History of post-sterilization tuboplasty 2000  . Plantar fasciitis    Right  . Shortness of breath    with exertion  . Sleep apnea 5 plus yrs   study -pt could not sleep test inconclusive.   . Tear of meniscus of left knee    x2  . Tear of meniscus of right knee    Past Surgical History:  Procedure Laterality Date  . CARPAL TUNNEL RELEASE Bilateral   . CHOLECYSTECTOMY    . FOOT TENDON SURGERY    . KNEE ARTHROSCOPY  10/10/2011   Procedure: ARTHROSCOPY KNEE;  Surgeon: Newt Minion, MD;  Location: [facility];  Service: Orthopedics;  Laterality: Right;  Right Knee Arthroscopy and Excision Medial Meniscus  . MENISCUS REPAIR     R and L (L x2)  . ROTATOR CUFF REPAIR Left   . TONSILLECTOMY    . TUBAL LIGATION     Social History   Socioeconomic History  . Marital status: Married    Spouse name: Not on file  . Number of children: Not on file  . Years of education: GED +1 yr  . Highest education level: Not on file  Occupational History  . Occupation: unemployed    Fish farm manager: UNEMPLOYED  Tobacco Use  . Smoking status: Never Smoker  . Smokeless tobacco: Never Used  Vaping Use  . Vaping Use: Never used  Substance and Sexual Activity  . Alcohol use: No    Alcohol/week: 0.0 standard drinks  . Drug use: No  . Sexual activity: Not on file  Other Topics Concern  . Not on file  Social History Narrative   Married, unemployed, Husband disabled and paraplegic 2/2 fall from tree stand while deer hunting, Son  quadriplegic 2/2 MVA 05/2007 in [place] Strain:   . Difficulty of Paying Living Expenses: Not on file  Food Insecurity:   . Worried About Charity fundraiser in the Last Year: Not on file  . Ran Out of Food in the Last Year: Not on file  Transportation Needs:   . Lack of Transportation (Medical): Not on file  . Lack of Transportation (Non-Medical): Not on file  Physical Activity:   . Days of Exercise per Week: Not on file  . Minutes of Exercise per Session: Not on file  Stress:   . Feeling of Stress : Not on file  Social Connections:   . Frequency of Communication with Friends and Family: Not on file  . Frequency of Social Gatherings with Friends and Family: Not on file  . Attends Religious Services: Not on file  . Active Member of Clubs or Organizations: Not on file  . Attends Archivist Meetings: Not on file  . Marital Status: Not on file   Family History  Problem Relation Age of Onset  . Breast cancer Sister        both sisters have breast cancer   Allergies  Allergen Reactions  .  Byetta 10 Mcg Pen [Exenatide] Anaphylaxis    Throat swelling, diaphoresis, and rash  . Metformin Shortness Of Breath, Diarrhea, Nausea And Vomiting and Rash  . Mango Butter Hives and Rash    Throat shuts down  . Ofloxacin Rash    Throat itching, tightness. Has tolerated Ciprofloxacin.   Prior to Admission medications   Medication Sig Start Date End Date Taking? Authorizing Provider  atorvastatin (LIPITOR) 20 MG tablet Take 1 tablet (20 mg total) by mouth daily. 03/24/20  Yes Alexandria Lodge, MD  cetirizine (ZYRTEC) 10 MG tablet Take 1 tablet (10 mg total) by mouth daily. 11/10/19  Yes Christian, Rylee, MD  cyclobenzaprine (FLEXERIL) 10 MG tablet TAKE 1 TABLET(10 MG) BY MOUTH EVERY 12 HOURS AS NEEDED FOR MUSCLE SPASMS Patient taking differently: Take 10 mg by mouth in the morning and at bedtime.  12/29/19  Yes Velna Ochs, MD    diclofenac sodium (VOLTAREN) 1 % GEL Apply 2 g topically 4 (four) times daily. 10/16/18  Yes Carroll Sage, MD  diphenhydrAMINE (BENADRYL) 25 mg capsule Take 1 capsule (25 mg total) by mouth every 6 (six) hours as needed. Patient taking differently: Take 25 mg by mouth every 6 (six) hours as needed for itching (rash).  10/16/18  Yes Carroll Sage, MD  empagliflozin (JARDIANCE) 10 MG TABS tablet Take 10 mg by mouth daily. 08/19/19  Yes Christian, Rylee, MD  insulin aspart (NOVOLOG FLEXPEN) 100 UNIT/ML FlexPen Inject 8 units before each meal three times a day Patient taking differently: Inject 0-5 Units into the skin 3 (three) times daily with meals. Dose per sliding scale 03/26/19  Yes Christian, Rylee, MD  insulin glargine (LANTUS) 100 UNIT/ML Solostar Pen Inject 35 Units into the skin at bedtime. Diagnosis code E11.9. 05/02/20  Yes Iona Beard, MD  liraglutide (VICTOZA) 18 MG/3ML SOPN Inject 1.2 mg into the skin daily. 05/26/20 08/24/20 Yes Andrew Au, MD  meclizine (ANTIVERT) 12.5 MG tablet Take 1 tablet (12.5 mg total) by mouth 3 (three) times daily as needed for dizziness. 10/27/19  Yes Ladona Horns, MD  meloxicam (MOBIC) 15 MG tablet Take 1 tablet (15 mg total) by mouth daily. 05/18/20 05/18/21 Yes Andrew Au, MD  naloxone [facility]) 2 MG/2ML injection Place 1 mg into the nose as needed (overdose).  10/20/19  Yes [provider]  oxybutynin (DITROPAN XL) 10 MG 24 hr tablet Take 1 tablet (10 mg total) by mouth daily. 03/24/20 03/24/21 Yes Alexandria Lodge, MD  Oxycodone HCl 10 MG TABS Take 1 tablet (10 mg total) by mouth 4 (four) times daily as needed. Patient taking differently: Take 10 mg by mouth every 6 (six) hours as needed (severe pain).  05/20/20  Yes Andrew Au, MD  rOPINIRole (REQUIP) 0.5 MG tablet TAKE 1 TABLET BY MOUTH EVERY NIGHT 1 TO 3 HOURS BEFORE BEDTIME Patient taking differently: Take 0.5 mg by mouth at bedtime. Take 1 TO 3 HOURS BEFORE BEDTIME 01/04/20  Yes Christian, Rylee,  MD  ACCU-CHEK FASTCLIX LANCETS MISC Check three times a day to check blood sugar. 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 insulin dependent 09/08/18   Sid Falcon, MD  aspirin EC 81 MG tablet Take 1 tablet (81 mg total) by mouth 2 (two) times daily. To be taken after surgery 06/03/20   Aundra Dubin, PA-C  Blood Glucose Monitoring Suppl (ACCU-CHEK GUIDE ME) w/Device KIT 1 each by Does not apply route 3 (three) times daily. 09/23/18   Carroll Sage, MD  docusate sodium (COLACE) 100  MG capsule Take 1 capsule (100 mg total) by mouth daily as needed. 06/03/20 06/03/21  Aundra Dubin, PA-C  glucose blood (ACCU-CHEK GUIDE) test strip Check blood sugar 3 times a day 01/19/20   Mitzi Hansen, MD  Insulin Pen Needle (PEN NEEDLES) 32G X 4 MM MISC 1 each by Does not apply route 5 (five) times daily. 01/19/20   Mitzi Hansen, MD  Insulin Syringe-Needle U-100 31G X 15/64" 0.3 ML MISC Please use for insulin administration 3 time daily. 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 Insulin dependent 09/27/17   Lenore Cordia, MD  methocarbamol (ROBAXIN) 500 MG tablet Take 1 tablet (500 mg total) by mouth 2 (two) times daily as needed. To be taken after surgery 06/03/20   Aundra Dubin, PA-C  ondansetron (ZOFRAN) 4 MG tablet Take 1 tablet (4 mg total) by mouth every 8 (eight) hours as needed for nausea or vomiting. 06/03/20   Aundra Dubin, PA-C  oxyCODONE-acetaminophen (PERCOCET) 5-325 MG tablet Take 1-2 tablets by mouth every 6 (six) hours as needed. To be taken after surgery 06/03/20   Aundra Dubin, PA-C  potassium chloride (KLOR-CON) 10 MEQ tablet Take 4 pills twice daily x 4 days 06/03/20   Aundra Dubin, PA-C  sulfamethoxazole-trimethoprim (BACTRIM DS) 800-160 MG tablet Take 1 tablet by mouth 2 (two) times daily. To be taken after surgery 06/03/20   Aundra Dubin, PA-C     Positive ROS: All other systems have been reviewed and were otherwise negative with the exception of those mentioned in the HPI and as above.  Physical  Exam: General: Alert, no acute distress Cardiovascular: No pedal edema Respiratory: No cyanosis, no use of accessory musculature GI: abdomen soft Skin: No lesions in the area of chief complaint Neurologic: Sensation intact distally Psychiatric: Patient is competent for consent with normal mood and affect Lymphatic: no lymphedema  MUSCULOSKELETAL: exam stable  Assessment: left knee degenerative joint disease  Plan: Plan for Procedure(s): LEFT TOTAL KNEE ARTHROPLASTY  The risks benefits and alternatives were discussed with the patient including but not limited to the risks of nonoperative treatment, versus surgical intervention including infection, bleeding, nerve injury,  blood clots, cardiopulmonary complications, morbidity, mortality, among others, and they were willing to proceed.   Preoperative templating of the joint replacement has been completed, documented, and submitted to the Operating Room personnel in order to optimize intra-operative equipment management.   Eduard Roux, MD 06/06/2020 6:18 AM

## 2020-06-06 NOTE — Anesthesia Procedure Notes (Signed)
Spinal  Patient location during procedure: OR Start time: 06/06/2020 9:15 AM End time: 06/06/2020 9:20 AM Staffing Performed: anesthesiologist  Anesthesiologist: Murvin Natal, MD Preanesthetic Checklist Completed: patient identified, IV checked, risks and benefits discussed, surgical consent, monitors and equipment checked, pre-op evaluation and timeout performed Spinal Block Patient position: sitting Prep: DuraPrep Patient monitoring: cardiac monitor, continuous pulse ox and blood pressure Approach: midline Location: L4-5 Injection technique: single-shot Needle Needle type: Pencan  Needle gauge: 24 G Needle length: 9 cm Assessment Sensory level: T10 Additional Notes Functioning IV was confirmed and monitors were applied. Sterile prep and drape, including hand hygiene and sterile gloves were used. The patient was positioned and the spine was prepped. The skin was anesthetized with lidocaine.  Free flow of clear CSF was obtained prior to injecting local anesthetic into the CSF.  The spinal needle aspirated freely following injection.  The needle was carefully withdrawn.  The patient tolerated the procedure well.

## 2020-06-06 NOTE — Op Note (Signed)
Total Knee Arthroplasty Procedure Note  Preoperative diagnosis: Left knee osteoarthritis  Postoperative diagnosis:same  Operative procedure: Left total knee arthroplasty. CPT [phone]  Surgeon: N. Eduard Roux, MD  Assist: Madalyn Rob, PA-C; necessary for the timely completion of procedure and due to complexity of procedure.  Anesthesia: Spinal, regional  Tourniquet time: 35 mins  Implants used: Zimmer persona uncemented Femur: CR 6 Tibia: E Patella: 32 mm Polyethylene: 12 mm, MC  Indication: [name] is a 59 y.o. year old female with a history of knee pain. Having failed conservative management, the patient elected to proceed with a total knee arthroplasty.  We have reviewed the risk and benefits of the surgery and they elected to proceed after voicing understanding.  Procedure:  After informed consent was obtained and understanding of the risk were voiced including but not limited to bleeding, infection, damage to surrounding structures including nerves and vessels, blood clots, leg length inequality and the failure to achieve desired results, the operative extremity was marked with verbal confirmation of the patient in the holding area.   The patient was then brought to the operating room and transported to the operating room table in the supine position.  A tourniquet was applied to the operative extremity around the upper thigh. The operative limb was then prepped and draped in the usual sterile fashion and preoperative antibiotics were administered.  A time out was performed prior to the start of surgery confirming the correct extremity, preoperative antibiotic administration, as well as team members, implants and instruments available for the case. Correct surgical site was also confirmed with preoperative radiographs. The limb was then elevated for exsanguination and the tourniquet was inflated. A midline incision was made and a standard medial parapatellar  approach was performed.  The patella was prepared and sized to a 32 mm.  A cover was placed on the patella for protection from retractors.  We then turned our attention to the femur. Posterior cruciate ligament was sacrificed. Start site was drilled in the femur and the intramedullary distal femoral cutting guide was placed, set at 5 degrees valgus, taking 10 mm of distal resection. The distal cut was made. Osteophytes were then removed.  Extension gap was then checked. Next, the proximal tibial cutting guide was placed with appropriate slope, varus/valgus alignment and depth of resection. The proximal tibial cut was made. Gap blocks were then used to assess the extension gap and alignment, and appropriate soft tissue releases were performed. Attention was turned back to the femur, which was sized using the sizing guide to a size 6. Appropriate rotation of the femoral component was determined using epicondylar axis, Whiteside's line, and assessing the flexion gap under ligament tension. The appropriate size 4-in-1 cutting block was placed and cuts were made.  Posterior femoral osteophytes and uncapped bone were then removed with the curved osteotome.  Trial components were placed, and stability was checked in full extension, mid-flexion, and deep flexion. Proper tibial rotation was determined and marked.  The patella tracked well without a lateral release. Trial components were then removed and tibial preparation performed.  The tibia was sized for a size E component.  A posterior capsular injection comprising of 20 cc of 1.3% exparel, 20 cc of 0.25% bupivicaine with epi and 20 cc of normal saline was performed for postoperative pain control. The bony surfaces were irrigated with a pulse lavage and then dried. The stability of the construct was re-evaluated throughout a range of motion and found to be acceptable. The  trial liner was removed, the knee was copiously irrigated, and the knee was re-evaluated for any  excess bone debris. The real polyethylene liner, 12 mm thick, was inserted and checked to ensure the locking mechanism had engaged appropriately. The tourniquet was deflated and hemostasis was achieved. The wound was irrigated with normal saline.  One gram of vancomycin powder was placed in the surgical bed.  Capsular closure was performed with a #1 vicryl, subcutaneous fat closed with a 0 vicryl suture, then subcutaneous tissue closed with interrupted 2.0 vicryl suture. The skin was then closed with a 2.0 nylon and dermabond. A sterile dressing was applied.  The patient was awakened in the operating room and taken to recovery in stable condition. All sponge, needle, and instrument counts were correct at the end of the case.  Position: supine  Complications: none.  Time Out: performed   Drains/Packing: none  Estimated blood loss: minimal  Returned to Recovery Room: in good condition.   Antibiotics: yes   Mechanical VTE (DVT) Prophylaxis: sequential compression devices, TED thigh-high  Chemical VTE (DVT) Prophylaxis: aspirin  Fluid Replacement  Crystalloid: see anesthesia record Blood: none  FFP: none   Specimens Removed: 1 to pathology   Sponge and Instrument Count Correct? yes   PACU: portable radiograph - knee AP and Lateral   Plan/RTC: Return in 2 weeks for wound check.   Weight Bearing/Load Lower Extremity: full   N. Eduard Roux, MD [facility] 10:31 AM

## 2020-06-06 NOTE — Anesthesia Postprocedure Evaluation (Signed)
Anesthesia Post Note  Patient: [name]  Procedure(s) Performed: LEFT TOTAL KNEE ARTHROPLASTY (Left Knee)     Patient location during evaluation: PACU Anesthesia Type: Regional and Spinal Level of consciousness: oriented and awake and alert Pain management: pain level controlled Vital Signs Assessment: post-procedure vital signs reviewed and stable Respiratory status: spontaneous breathing, respiratory function stable and patient connected to nasal cannula oxygen Cardiovascular status: blood pressure returned to baseline and stable Postop Assessment: no headache, no backache, no apparent nausea or vomiting and spinal receding Anesthetic complications: no   No complications documented.  Last Vitals:  Vitals:   06/06/20 1308 06/06/20 1311  BP: (!) 166/92 (!) 173/98  Pulse: 70 75  Resp: 20 20  Temp: 36.6 C 36.6 C  SpO2:  99%    Last Pain:  Vitals:   06/06/20 1311  TempSrc: Oral  PainSc:                  Eiko Mcgowen P Anesha Hackert

## 2020-06-06 NOTE — Anesthesia Procedure Notes (Signed)
Procedure Name: MAC Date/Time: 06/06/2020 9:30 AM Performed by: Lavell Luster, CRNA Pre-anesthesia Checklist: Patient identified, Emergency Drugs available, Suction available, Patient being monitored and Timeout performed Patient Re-evaluated:Patient Re-evaluated prior to induction Oxygen Delivery Method: Nasal cannula Preoxygenation: Pre-oxygenation with 100% oxygen Induction Type: IV induction Placement Confirmation: breath sounds checked- equal and bilateral and positive ETCO2 Dental Injury: Teeth and Oropharynx as per pre-operative assessment

## 2020-06-06 NOTE — Anesthesia Procedure Notes (Signed)
Anesthesia Regional Block: Adductor canal block   Pre-Anesthetic Checklist: ,, timeout performed, Correct Patient, Correct Site, Correct Laterality, Correct Procedure,, site marked, risks and benefits discussed, Surgical consent,  Pre-op evaluation,  At surgeon's request and post-op pain management  Laterality: Left  Prep: chloraprep       Needles:  Injection technique: Single-shot  Needle Type: Echogenic Stimulator Needle     Needle Length: 10cm  Needle Gauge: 20     Additional Needles:   Procedures:,,,, ultrasound used (permanent image in chart),,,,  Narrative:  Start time: 06/06/2020 9:00 AM End time: 06/06/2020 9:10 AM Injection made incrementally with aspirations every 5 mL.  Performed by: Personally  Anesthesiologist: Murvin Natal, MD  Additional Notes: Functioning IV was confirmed and monitors were applied. A time-out was performed. Hand hygiene and sterile gloves were used. The thigh was placed in a frog-leg position and prepped in a sterile fashion. A 190mm 20ga BBraun echogenic stimulator needle was placed using ultrasound guidance.  Negative aspiration and negative test dose prior to incremental administration of local anesthetic. The patient tolerated the procedure well.

## 2020-06-06 NOTE — Transfer of Care (Signed)
Immediate Anesthesia Transfer of Care Note  Patient: [name]  Procedure(s) Performed: LEFT TOTAL KNEE ARTHROPLASTY (Left Knee)  Patient Location: PACU  Anesthesia Type:MAC and Spinal  Level of Consciousness: awake, alert  and oriented  Airway & Oxygen Therapy: Patient connected to nasal cannula oxygen  Post-op Assessment: Post -op Vital signs reviewed and stable  Post vital signs: stable  Last Vitals:  Vitals Value Taken Time  BP    Temp    Pulse    Resp    SpO2      Last Pain:  Vitals:   06/06/20 0819  TempSrc:   PainSc: 5       Patients Stated Pain Goal: 5 (57/26/20 3559)  Complications: No complications documented.

## 2020-06-07 ENCOUNTER — Telehealth: Payer: Self-pay

## 2020-06-07 ENCOUNTER — Encounter (HOSPITAL_COMMUNITY): Payer: Self-pay | Admitting: Orthopaedic Surgery

## 2020-06-07 DIAGNOSIS — M1712 Unilateral primary osteoarthritis, left knee: Secondary | ICD-10-CM | POA: Diagnosis not present

## 2020-06-07 LAB — BASIC METABOLIC PANEL
Anion gap: 9 (ref 5–15)
BUN: 16 mg/dL (ref 6–20)
CO2: 22 mmol/L (ref 22–32)
Calcium: 9.2 mg/dL (ref 8.9–10.3)
Chloride: 105 mmol/L (ref 98–111)
Creatinine, Ser: 0.74 mg/dL (ref 0.44–1.00)
GFR, Estimated: >60 mL/min (ref >60)
Glucose, Bld: 200 mg/dL — ABNORMAL HIGH (ref 70–99)
Potassium: 4 mmol/L (ref 3.5–5.1)
Sodium: 136 mmol/L (ref 135–145)

## 2020-06-07 LAB — CBC
HCT: 40 % (ref 36.0–46.0)
Hemoglobin: 12.8 g/dL (ref 12.0–15.0)
MCH: 27.6 pg (ref 26.0–34.0)
MCHC: 32 g/dL (ref 30.0–36.0)
MCV: 86.4 fL (ref 80.0–100.0)
Platelets: 263 10*3/uL (ref 150–400)
RBC: 4.63 MIL/uL (ref 3.87–5.11)
RDW: 13.6 % (ref 11.5–15.5)
WBC: 13.8 10*3/uL — ABNORMAL HIGH (ref 4.0–10.5)
nRBC: 0 % (ref 0.0–0.2)

## 2020-06-07 LAB — GLUCOSE, CAPILLARY: Glucose-Capillary: 167 mg/dL — ABNORMAL HIGH (ref 70–99)

## 2020-06-07 NOTE — Progress Notes (Signed)
Physical Therapy Treatment & Discharge Patient Details Name: [name] MRN: [id number] DOB: October 08, 1960 Today's Date: 06/07/2020    History of Present Illness Pt is a 59 y/o female s/p L TKA on 06/06/20. PMH includes DM, fibromyalgia.   PT Comments    Pt progressing well with mobility. Mod indep with bed mobility and transfers using RW; ambulating well with RW at supervision-level, intermittent cues for gait mechanics. Educ re: therex (HEP handout provided), precautions, positioning, activity recommendations and importance of mobility, especially L knee ROM. Pt preparing to d/c home today. Has met short-term acute PT goals and has no further questions or concerns. Will d/c acute PT.    Follow Up Recommendations  Home health PT;Follow surgeon's recommendation for DC plan and follow-up therapies     Equipment Recommendations  None recommended by PT    Recommendations for Other Services       Precautions / Restrictions Precautions Precautions: Knee;Fall Precaution Comments: Verbally reviewed knee precautions Restrictions Weight Bearing Restrictions: Yes LLE Weight Bearing: Weight bearing as tolerated    Mobility  Bed Mobility Overal bed mobility: Independent Bed Mobility: Rolling;Sidelying to Sit Rolling: Independent Sidelying to sit: Independent       General bed mobility comments: Moving very well in bed with bed flat and no use of bed rail  Transfers Overall transfer level: Modified independent Equipment used: Rolling walker (2 wheeled);None Transfers: Sit to/from Stand              Ambulation/Gait Ambulation/Gait assistance: Supervision Gait Distance (Feet): 250 Feet Assistive device: Rolling walker (2 wheeled) Gait Pattern/deviations: Step-through pattern;Decreased stride length;Decreased weight shift to left Gait velocity: Decreased   General Gait Details: Slow, antalgic gait with RW and supervision for safety; walking into/out of bathroom  without DME (RW did not fit well), reaching to rail/furniture for UE support, but still demonstrating improved stability   Stairs Stairs:  (declined - will use ramp into home)           Wheelchair Mobility    Modified Rankin (Stroke Patients Only)       Balance Overall balance assessment: Needs assistance Sitting-balance support: No upper extremity supported;Feet supported Sitting balance-Leahy Scale: Good       Standing balance-Leahy Scale: Fair Standing balance comment: can stand and take steps without UE support; dynamic stability improved with RW                            Cognition Arousal/Alertness: Awake/alert Behavior During Therapy: WFL for tasks assessed/performed Overall Cognitive Status: Within Functional Limits for tasks assessed                                        Exercises Other Exercises Other Exercises: [employer] HEP handout (Access Code [email or web address]) provided - LAQ, SLR, heel slides w/ strap, seated ankle/heel raises    General Comments        Pertinent Vitals/Pain Pain Assessment: 0-10 Pain Score: 6  Pain Location: L knee  Pain Descriptors / Indicators: Aching;Operative site guarding Pain Intervention(s): Monitored during session;Limited activity within patient's tolerance    Home Living                      Prior Function            PT Goals (current goals can now be found in the care plan  section) Progress towards PT goals: Goals met/education completed, patient discharged from PT    Frequency    7X/week      PT Plan Current plan remains appropriate    Co-evaluation              AM-PAC PT "6 Clicks" Mobility   Outcome Measure  Help needed turning from your back to your side while in a flat bed without using bedrails?: None Help needed moving from lying on your back to sitting on the side of a flat bed without using bedrails?: None Help needed moving to and from a bed to a chair  (including a wheelchair)?: None Help needed standing up from a chair using your arms (e.g., wheelchair or bedside chair)?: None Help needed to walk in hospital room?: None Help needed climbing 3-5 steps with a railing? : A Little 6 Click Score: 23    End of Session   Activity Tolerance: Patient tolerated treatment well Patient left: in chair;with call bell/phone within reach Nurse Communication: Mobility status PT Visit Diagnosis: Pain;Other abnormalities of gait and mobility (R26.89) Pain - Right/Left: Left Pain - part of body: Knee     Time: 4854-6270 PT Time Calculation (min) (ACUTE ONLY): 20 min  Charges:  $Gait Training: 8-22 mins                    Mabeline Caras, PT, DPT Acute Rehabilitation Services  Pager [phone] Office [place] 06/07/2020, 9:36 AM

## 2020-06-07 NOTE — Telephone Encounter (Signed)
Please advise 

## 2020-06-07 NOTE — Telephone Encounter (Signed)
Patient called in for refill on oxycodone . Says she would like refill to [facility] sent to [facility] , also says she needs a pre auth for the medication   [facility] [address]

## 2020-06-07 NOTE — Telephone Encounter (Signed)
She was written pain medication rx last Friday to take AFTER surgery.  How is she out?

## 2020-06-07 NOTE — Progress Notes (Signed)
Pt doing well. Pt given D/C instructions with verbal understanding. Rx's were sent to the pharmacy by MD. Pt's incision is clean and dry with no sign of infection. Pt's Home Health was arranged prior to admission. Pt D/C'd home via wheelchair per MD order. Pt is stable @ D/C and has no other needs at this time. Holli Humbles, RN

## 2020-06-07 NOTE — Discharge Summary (Signed)
Patient ID: [name] MRN: [id number] DOB/AGE: [birth year] 59 y.o.  Admit date: 06/06/2020 Discharge date: 06/07/2020  Admission Diagnoses:  Principal Problem:   Primary osteoarthritis of left knee Active Problems:   Status post total left knee replacement   Discharge Diagnoses:  Same  Past Medical History:  Diagnosis Date  . 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 Arthritis   . Diabetes mellitus    oral tx  . Fibromyalgia   . GERD (gastroesophageal reflux disease)   . History of post-sterilization tuboplasty 2000  . Plantar fasciitis    Right  . Shortness of breath    with exertion  . Sleep apnea 5 plus yrs   study -pt could not sleep test inconclusive.   . Tear of meniscus of left knee    x2  . Tear of meniscus of right knee     Surgeries: Procedure(s): LEFT TOTAL KNEE ARTHROPLASTY on 06/06/2020   Consultants:   Discharged Condition: Improved  Hospital Course: Juri Dinning is an 59 y.o. female who was admitted 06/06/2020 for operative treatment ofPrimary osteoarthritis of left knee. Patient has severe unremitting pain that affects sleep, daily activities, and work/hobbies. After pre-op clearance the patient was taken to the operating room on 06/06/2020 and underwent  Procedure(s): LEFT TOTAL KNEE ARTHROPLASTY.    Patient was given perioperative antibiotics:  Anti-infectives (From admission, onward)   Start     Dose/Rate Route Frequency Ordered Stop   06/06/20 1500  ceFAZolin (ANCEF) IVPB 2g/100 mL premix        2 g 200 mL/hr over 30 Minutes Intravenous Every 6 hours 06/06/20 1309 06/06/20 2103   06/06/20 0930  ceFAZolin (ANCEF) IVPB 2g/100 mL premix  Status:  Discontinued        2 g 200 mL/hr over 30 Minutes Intravenous On call to O.R. 06/03/20 0908 06/06/20 1300   06/06/20 0904  vancomycin (VANCOCIN) powder  Status:  Discontinued          As needed 06/06/20 0905 06/06/20 1112       Patient was  given sequential compression devices, early ambulation, and chemoprophylaxis to prevent DVT.  Patient benefited maximally from hospital stay and there were no complications.    Recent vital signs:  Patient Vitals for the past 24 hrs:  BP Temp Temp src Pulse Resp SpO2 Height Weight  06/07/20 0422 (!) 143/71 97.9 F (36.6 C) Oral 78 20 98 % -- --  06/06/20 2259 120/68 98 F (36.7 C) Oral 91 16 95 % -- --  06/06/20 2051 (!) 118/96 98.3 F (36.8 C) Oral 93 16 96 % -- --  06/06/20 1726 139/82 98.8 F (37.1 C) Oral 98 16 94 % -- --  06/06/20 1311 (!) 173/98 97.8 F (36.6 C) Oral 75 20 99 % -- --  06/06/20 1308 (!) 166/92 97.8 F (36.6 C) Oral 70 20 -- -- --  06/06/20 1245 (!) 169/87 (!) 97.5 F (36.4 C) -- 72 12 95 % -- --  06/06/20 1230 -- 97.7 F (36.5 C) -- 65 12 96 % -- --  06/06/20 1215 (!) 164/77 -- -- 63 15 97 % -- --  06/06/20 1200 (!) 164/88 -- -- 68 (!) 21 99 % -- --  06/06/20 1145 (!) 163/78 -- -- (!) 59 12 100 % -- --  06/06/20 1130 (!) 147/72 -- -- 62 10 100 % -- --  06/06/20 1115 136/67 98.5 F (36.9 C) -- 63 11 100 % -- --  06/06/20 0905 (!) 167/66 -- -- 78 (!) 21 100 % -- --  06/06/20 0900 (!) 169/67 -- -- 72 17 100 % -- --  06/06/20 0751 (!) 158/76 97.6 F (36.4 C) Oral 79 20 98 % 5' (1.524 m) 86.5 kg     Recent laboratory studies:  Recent Labs    06/07/20 0557  WBC 13.8*  HGB 12.8  HCT 40.0  PLT 263  NA 136  K 4.0  CL 105  CO2 22  BUN 16  CREATININE 0.74  GLUCOSE 200*  CALCIUM 9.2     Discharge Medications:   Allergies as of 06/07/2020      Reactions   Byetta 10 Mcg Pen [exenatide] Anaphylaxis   Throat swelling, diaphoresis, and rash   Metformin Shortness Of Breath, Diarrhea, Nausea And Vomiting, Rash   Mango Butter Hives, Rash   Throat shuts down   Ofloxacin Rash   Throat itching, tightness. Has tolerated Ciprofloxacin.      Medication List    STOP taking these medications   cyclobenzaprine 10 MG tablet Commonly known as: FLEXERIL    meloxicam 15 MG tablet Commonly known as: Mobic   Oxycodone HCl 10 MG Tabs   potassium chloride 10 MEQ tablet Commonly known as: KLOR-CON     TAKE these medications   Accu-Chek FastClix Lancets Misc Check three times a day to check blood sugar. 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 insulin dependent   Accu-Chek Guide Me w/Device Kit 1 each by Does not apply route 3 (three) times daily.   Accu-Chek Guide test strip Generic drug: glucose blood Check blood sugar 3 times a day   aspirin EC 81 MG tablet Take 1 tablet (81 mg total) by mouth 2 (two) times daily. To be taken after surgery   atorvastatin 20 MG tablet Commonly known as: LIPITOR Take 1 tablet (20 mg total) by mouth daily.   cetirizine 10 MG tablet Commonly known as: ZYRTEC Take 1 tablet (10 mg total) by mouth daily.   diclofenac sodium 1 % Gel Commonly known as: VOLTAREN Apply 2 g topically 4 (four) times daily.   diphenhydrAMINE 25 mg capsule Commonly known as: Benadryl Take 1 capsule (25 mg total) by mouth every 6 (six) hours as needed. What changed: reasons to take this   docusate sodium 100 MG capsule Commonly known as: Colace Take 1 capsule (100 mg total) by mouth daily as needed.   insulin glargine 100 UNIT/ML Solostar Pen Commonly known as: LANTUS Inject 35 Units into the skin at bedtime. Diagnosis code E11.9.   Insulin Syringe-Needle U-100 31G X 15/64" 0.3 ML Misc Please use for insulin administration 3 time daily. 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 Insulin dependent   Jardiance 10 MG Tabs tablet Generic drug: empagliflozin Take 10 mg by mouth daily.   liraglutide 18 MG/3ML Sopn Commonly known as: VICTOZA Inject 1.2 mg into the skin daily.   meclizine 12.5 MG tablet Commonly known as: ANTIVERT Take 1 tablet (12.5 mg total) by mouth 3 (three) times daily as needed for dizziness.   methocarbamol 500 MG tablet Commonly known as: Robaxin Take 1 tablet (500 mg total) by mouth 2 (two) times daily as needed. To be taken after  surgery   naloxone 2 MG/2ML injection Commonly known as: NARCAN Place 1 mg into the nose as needed (overdose).   NovoLOG FlexPen 100 UNIT/ML FlexPen Generic drug: insulin aspart Inject 8 units before each meal three times a day What changed:   how much to take  how to take this  when to take this  additional instructions   ondansetron 4 MG tablet Commonly known as: Zofran Take 1 tablet (4 mg total) by mouth every 8 (eight) hours as needed for nausea or vomiting.   oxybutynin 10 MG 24 hr tablet Commonly known as: Ditropan XL Take 1 tablet (10 mg total) by mouth daily.   oxyCODONE-acetaminophen 5-325 MG tablet Commonly known as: Percocet Take 1-2 tablets by mouth every 6 (six) hours as needed. To be taken after surgery   Pen Needles 32G X 4 MM Misc 1 each by Does not apply route 5 (five) times daily.   rOPINIRole 0.5 MG tablet Commonly known as: REQUIP TAKE 1 TABLET BY MOUTH EVERY NIGHT 1 TO 3 HOURS BEFORE BEDTIME What changed:   how much to take  how to take this  when to take this  additional instructions   sulfamethoxazole-trimethoprim 800-160 MG tablet Commonly known as: BACTRIM DS Take 1 tablet by mouth 2 (two) times daily. To be taken after surgery            Durable Medical Equipment  (From admission, onward)         Start     Ordered   06/06/20 1310  DME Walker rolling  Once       Question:  Patient needs a walker to treat with the following condition  Answer:  Total knee replacement status   06/06/20 1309   06/06/20 1310  DME 3 n 1  Once        06/06/20 1309   06/06/20 1310  DME Bedside commode  Once       Question:  Patient needs a bedside commode to treat with the following condition  Answer:  Total knee replacement status   06/06/20 1309          Diagnostic Studies: DG Chest 2 View  Result Date: 06/03/2020 CLINICAL DATA:  Primary localized osteoarthritis of the left knee. EXAM: CHEST - 2 VIEW COMPARISON:  None. FINDINGS: The  heart size and mediastinal contours are within normal limits. Both lungs are clear. The visualized skeletal structures are unremarkable. IMPRESSION: No active cardiopulmonary disease. Electronically Signed   By: San Morelle M.D.   On: 06/03/2020 15:10   DG Knee Left Port  Result Date: 06/06/2020 CLINICAL DATA:  Left total knee replacement. EXAM: PORTABLE LEFT KNEE - 1-2 VIEW COMPARISON:  Left knee x-rays dated April 19, 2020. FINDINGS: The left knee demonstrates a total knee arthroplasty without evidence of hardware failure or complication. Small joint effusion. There is no fracture or dislocation. The alignment is anatomic. Post-surgical changes noted in the surrounding soft tissues. IMPRESSION: 1. Left total knee arthroplasty without acute postoperative complication. Electronically Signed   By: Titus Dubin M.D.   On: 06/06/2020 14:13    Disposition: Discharge disposition: 01-Home or Self Care          Follow-up Information    Leandrew Koyanagi, MD. Schedule an appointment as soon as possible for a visit in 2 week(s).   Specialty: Orthopedic Surgery Contact information: [address] [place] [address] [phone]                Signed: Aundra Dubin 06/07/2020, 7:42 AM

## 2020-06-07 NOTE — Discharge Instructions (Signed)

## 2020-06-07 NOTE — Progress Notes (Signed)
Subjective: 1 Day Post-Op Procedure(s) (LRB): LEFT TOTAL KNEE ARTHROPLASTY (Left) Patient reports pain as mild.    Objective: Vital signs in last 24 hours: Temp:  [97.5 F (36.4 C)-98.8 F (37.1 C)] 97.9 F (36.6 C) (11/23 0422) Pulse Rate:  [59-98] 78 (11/23 0422) Resp:  [10-21] 20 (11/23 0422) BP: (118-173)/(66-98) 143/71 (11/23 0422) SpO2:  [94 %-100 %] 98 % (11/23 0422) Weight:  [86.5 kg] 86.5 kg (11/22 0751)  Intake/Output from previous day: 11/22 0701 - 11/23 0700 In: 1202.3 [I.V.:952.3; IV Piggyback:250] Out: 900 [Urine:900] Intake/Output this shift: No intake/output data recorded.  Recent Labs    06/07/20 0557  HGB 12.8   Recent Labs    06/07/20 0557  WBC 13.8*  RBC 4.63  HCT 40.0  PLT 263   Recent Labs    06/07/20 0557  NA 136  K 4.0  CL 105  CO2 22  BUN 16  CREATININE 0.74  GLUCOSE 200*  CALCIUM 9.2   No results for input(s): LABPT, INR in the last 72 hours.  Neurologically intact Neurovascular intact Sensation intact distally Intact pulses distally Dorsiflexion/Plantar flexion intact Incision: dressing C/D/I No cellulitis present Compartment soft   Assessment/Plan: 1 Day Post-Op Procedure(s) (LRB): LEFT TOTAL KNEE ARTHROPLASTY (Left) Advance diet Up with therapy D/C IV fluids Discharge home with home health after first or second PT session (up to patient) WBAT LLE D/c foley  Anticipated LOS equal to or greater than 2 midnights due to - Age 58 and older with one or more of the following:  - Obesity  - Expected need for hospital services (PT, OT, Nursing) required for safe  discharge  - Anticipated need for postoperative skilled nursing care or inpatient rehab  - Active co-morbidities: Diabetes OR   - Unanticipated findings during/Post Surgery: Slow post-op progression: GI, pain control, mobility  - Patient is a high risk of re-admission due to: None    Aundra Dubin 06/07/2020, 7:39 AM

## 2020-06-08 ENCOUNTER — Telehealth: Payer: Self-pay

## 2020-06-08 ENCOUNTER — Other Ambulatory Visit: Payer: Self-pay | Admitting: Physician Assistant

## 2020-06-08 ENCOUNTER — Telehealth: Payer: Self-pay | Admitting: Physician Assistant

## 2020-06-08 MED ORDER — OXYCODONE-ACETAMINOPHEN 5-325 MG PO TABS
1.0000 | ORAL_TABLET | Freq: Four times a day (QID) | ORAL | 0 refills | Status: DC | PRN
Start: 2020-06-08 — End: 2020-06-16

## 2020-06-08 NOTE — Telephone Encounter (Signed)
LMOM for patient of the below message and to call back and let us know

## 2020-06-08 NOTE — Telephone Encounter (Signed)
Just sent in and called patient to let her know

## 2020-06-08 NOTE — Telephone Encounter (Signed)
Message sent to lindsey to address.

## 2020-06-08 NOTE — Telephone Encounter (Signed)
Patient called she stated she never picked up Rx that was sent in last week, she [facility] [address] [phone]

## 2020-06-08 NOTE — Telephone Encounter (Signed)
Can you please see below and resent to requested pharm? Thanks!

## 2020-06-08 NOTE — Telephone Encounter (Signed)
Pt called stating her original rx [facility] but when she asked [facility] she was informed the pain rx would have to be resent. Pt would like to have this done and a CB   [phone]

## 2020-06-16 ENCOUNTER — Other Ambulatory Visit: Payer: Self-pay

## 2020-06-16 DIAGNOSIS — R21 Rash and other nonspecific skin eruption: Secondary | ICD-10-CM

## 2020-06-16 NOTE — Telephone Encounter (Signed)
oxyCODONE-acetaminophen (PERCOCET) 5-325 MG tablet,   cetirizine (ZYRTEC) 10 MG tablet  REFILL REQUEST @  [id number] [place] [place] [facility] [address]. Phone:  [phone]  Fax:  [phone]

## 2020-06-17 ENCOUNTER — Inpatient Hospital Stay: Payer: Medicaid Other | Admitting: Orthopaedic Surgery

## 2020-06-17 MED ORDER — CETIRIZINE HCL 10 MG PO TABS: 10.0000 mg | ORAL_TABLET | Freq: Every day | ORAL | 3 refills | Status: DC

## 2020-06-17 MED ORDER — OXYCODONE-ACETAMINOPHEN 5-325 MG PO TABS
1.0000 | ORAL_TABLET | Freq: Four times a day (QID) | ORAL | 0 refills | Status: DC | PRN
Start: 2020-06-17 — End: 2020-09-21

## 2020-06-20 ENCOUNTER — Other Ambulatory Visit: Payer: Self-pay | Admitting: Internal Medicine

## 2020-06-20 ENCOUNTER — Other Ambulatory Visit: Payer: Self-pay | Admitting: Physician Assistant

## 2020-06-20 ENCOUNTER — Telehealth: Payer: Self-pay | Admitting: Student

## 2020-06-20 ENCOUNTER — Telehealth: Payer: Self-pay | Admitting: *Deleted

## 2020-06-20 DIAGNOSIS — N3941 Urge incontinence: Secondary | ICD-10-CM

## 2020-06-20 NOTE — Telephone Encounter (Addendum)
Information was sent to [facility] for [facility] for Oxycodone-Acetaminophen  Awaiting determination.  Sander Nephew, RN 06/20/2020 11:54 AM.  Joylene Igo from [place] [facility] was approved for Oxycodone-Acetaminophen 5-325 06/22/2020 thru 12/19/2020.  Sander Nephew, RN 06/23/2020 8:59 AM.

## 2020-06-21 ENCOUNTER — Other Ambulatory Visit: Payer: Self-pay | Admitting: *Deleted

## 2020-06-21 ENCOUNTER — Ambulatory Visit (INDEPENDENT_AMBULATORY_CARE_PROVIDER_SITE_OTHER): Payer: Medicaid Other | Admitting: Physician Assistant

## 2020-06-21 ENCOUNTER — Inpatient Hospital Stay: Payer: Medicaid Other | Admitting: Orthopaedic Surgery

## 2020-06-21 ENCOUNTER — Other Ambulatory Visit: Payer: Self-pay

## 2020-06-21 ENCOUNTER — Encounter: Payer: Self-pay | Admitting: Orthopaedic Surgery

## 2020-06-21 DIAGNOSIS — Z96652 Presence of left artificial knee joint: Secondary | ICD-10-CM

## 2020-06-21 NOTE — Progress Notes (Signed)
Post-Op Visit Note   Patient: [name]           Date of Birth: 1960-11-05           MRN: [id number] Visit Date: 06/21/2020 PCP: Mitzi Hansen, MD   Assessment & Plan:  Chief Complaint:  Chief Complaint  Patient presents with  . Left Knee - Routine Post Op, Pain   Visit Diagnoses:  1. Hx of total knee replacement, left     Plan: Patient is a pleasant 59 year old female who comes in today 2 weeks out left total knee replacement.  She has been doing well.  She has been getting home health physical therapy and is ambulating with a walker.  She is taking minimal pain medication.  Examination of her left knee reveals a well-healing surgical incision with nylon sutures in place.  No evidence of infection or cellulitis.  Calf soft nontender.  At this point, sutures were removed and Steri-Strips applied.  Dental prophylaxis reinforced.  We will start her in outpatient physical therapy and an internal referral has been made.  She will follow up with [name] in 4 weeks time for repeat evaluation and 2 view x-rays of the left knee.  Follow-Up Instructions: Return in about 4 weeks (around 07/19/2020).   Orders:  Orders Placed This Encounter  Procedures  . Ambulatory referral to Physical Therapy   No orders of the defined types were placed in this encounter.   Imaging: No new imaging  PMFS History: Patient Active Problem List   Diagnosis Date Noted  . Status post total left knee replacement 06/06/2020  . Primary osteoarthritis of left knee 06/05/2020  . Tendinopathy of rotator cuff, right 09/30/2019  . Obesity (BMI 30-39.9) 04/02/2019  . Urinary incontinence 01/01/2019  . Chronic right shoulder pain 12/01/2018  . Left hip pain 12/05/2017  . Gluteal tendinitis, left hip 10/04/2017  . Sacroiliac joint pain 09/12/2017  . Right knee pain 04/11/2017  . Spondylosis of lumbar region without myelopathy or radiculopathy 02/18/2017  . Chronic midline low back pain with bilateral  sciatica 01/31/2017  . Restless legs syndrome (RLS) 10/24/2016  . Rotator cuff tear, non-traumatic, left 04/26/2016  . Long-term current use of opiate analgesic 03/01/2016  . Hot flashes 12/29/2015  . Carpal tunnel syndrome of right wrist 12/15/2015  . S/P left rotator cuff repair 12/09/2015  . Family history of breast cancer 11/29/2015  . Acromioclavicular joint arthritis 09/19/2015  . Chronic back pain 06/15/2013  . Insomnia 12/02/2012  . Episode of dizziness 06/02/2012  . Fibromyalgia 05/23/2012  . Healthcare maintenance 03/01/2011  . Hyperlipidemia 07/27/2010  . Type 2 diabetes mellitus with retinopathy of both eyes, with long-term current use of insulin ([place]) 07/26/2010  . ADHESIVE CAPSULITIS, RIGHT 09/04/2006  . Other tear of cartilage or meniscus of knee, current 09/04/2006   Past Medical History:  Diagnosis Date  . 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 Arthritis   . Diabetes mellitus    oral tx  . Fibromyalgia   . GERD (gastroesophageal reflux disease)   . History of post-sterilization tuboplasty 2000  . Plantar fasciitis    Right  . Shortness of breath    with exertion  . Sleep apnea 5 plus yrs   study -pt could not sleep test inconclusive.   . Tear of meniscus of left knee    x2  . Tear of meniscus of right knee     Family History  Problem Relation  Age of Onset  . Breast cancer Sister        both sisters have breast cancer    Past Surgical History:  Procedure Laterality Date  . CARPAL TUNNEL RELEASE Bilateral   . CHOLECYSTECTOMY    . FOOT TENDON SURGERY    . KNEE ARTHROSCOPY  10/10/2011   Procedure: ARTHROSCOPY KNEE;  Surgeon: Newt Minion, MD;  Location: [facility];  Service: Orthopedics;  Laterality: Right;  Right Knee Arthroscopy and Excision Medial Meniscus  . MENISCUS REPAIR     R and L (L x2)  . ROTATOR CUFF REPAIR Left   . TONSILLECTOMY    . TOTAL KNEE ARTHROPLASTY Left 06/06/2020   Procedure: LEFT  TOTAL KNEE ARTHROPLASTY;  Surgeon: Leandrew Koyanagi, MD;  Location: [facility];  Service: Orthopedics;  Laterality: Left;  . TUBAL LIGATION     Social History   Occupational History  . Occupation: unemployed    Fish farm manager: UNEMPLOYED  Tobacco Use  . Smoking status: Never Smoker  . Smokeless tobacco: Never Used  Vaping Use  . Vaping Use: Never used  Substance and Sexual Activity  . Alcohol use: No    Alcohol/week: 0.0 standard drinks  . Drug use: No  . Sexual activity: Not on file

## 2020-06-22 MED ORDER — OXYCODONE HCL 10 MG PO TABS: 10.0000 mg | ORAL_TABLET | Freq: Four times a day (QID) | ORAL | 0 refills | Status: AC | PRN

## 2020-06-22 MED ORDER — NALOXONE HCL 4 MG/0.1ML NA LIQD: 1.0000 | Freq: Once | NASAL | 0 refills | Status: DC

## 2020-06-22 MED ORDER — OXYCODONE HCL 10 MG PO TABS: 10.0000 mg | ORAL_TABLET | Freq: Four times a day (QID) | ORAL | 0 refills | Status: DC | PRN

## 2020-06-22 MED ORDER — NALOXONE HCL 4 MG/0.1ML NA LIQD: 1.0000 | Freq: Once | NASAL | 0 refills | Status: AC

## 2020-06-22 NOTE — Telephone Encounter (Signed)
Patient called in stating she is completely out of her pain med. Explained this was sent to [facility] this AM. [facility]. [facility] Rxs x 3 cancelled at [facility]. [facility]. L. Consuelo Suthers, BSN, RN-BC

## 2020-06-22 NOTE — Addendum Note (Signed)
Addended by: Velora Heckler on: 06/22/2020 03:20 PM   Modules accepted: Orders

## 2020-06-22 NOTE — Addendum Note (Signed)
Addended by: Velora Heckler on: 06/22/2020 04:39 PM   Modules accepted: Orders

## 2020-06-22 NOTE — Telephone Encounter (Signed)
Spoke with pt regarding her current opioid use in the setting of post-operative pain. She underwent a left total knee replacement on 06/06/20.  She is still taking oxycodone 10mg  every 6h as needed which she has been on for several years. She has also received additional percocet 5-325mg  to be used every 4h as needed for breakthrough pain as prescribed by orthopedics.  We discussed her pain management at this time. She notes that she is still having 7-10/10 pain that is worse with PT and sitting for long periods of time however she notes that pain is slowly improving and she is not requiring the percocet as often.   I visited with her about the effects of long term opioid use on pain perception and that it can make acute pain difficult to manage safely. We discussed realistic goals of pain management in the post op setting.  I also reviewed the risks of taking oxycodone and percocet together with her. She acknowledges these risks. I discussed the importance of having narcan available to her to be administered by family or other individual should she have an opioid overdose. She was agreeable to trying to pick this up at the pharmacy as long as it is within feasible financial means. She will let our office know if it is too expensive so we can discuss alternative means to obtaining it.  She is also agreeable to coming into the clinic for follow up of her chronic medical conditions. She is scheduled for 07/01/20 with me.  I personally reviewed her PDMP and prior UDS from 2019 to most recent, all of which were appropriate.   Mitzi Hansen, MD Internal Medicine Resident PGY-2 [facility] Pager: [phone] 06/22/2020 9:42 AM

## 2020-06-22 NOTE — Addendum Note (Signed)
Addended by: Mitzi Hansen on: 06/22/2020 05:11 PM   Modules accepted: Orders

## 2020-06-23 ENCOUNTER — Other Ambulatory Visit: Payer: Self-pay

## 2020-06-23 ENCOUNTER — Ambulatory Visit: Payer: Medicaid Other | Attending: Physician Assistant

## 2020-06-23 DIAGNOSIS — Z96652 Presence of left artificial knee joint: Secondary | ICD-10-CM

## 2020-06-23 DIAGNOSIS — M25662 Stiffness of left knee, not elsewhere classified: Secondary | ICD-10-CM | POA: Diagnosis present

## 2020-06-23 DIAGNOSIS — R600 Localized edema: Secondary | ICD-10-CM

## 2020-06-23 DIAGNOSIS — R262 Difficulty in walking, not elsewhere classified: Secondary | ICD-10-CM

## 2020-06-23 DIAGNOSIS — M25562 Pain in left knee: Secondary | ICD-10-CM | POA: Diagnosis present

## 2020-06-23 DIAGNOSIS — M6281 Muscle weakness (generalized): Secondary | ICD-10-CM

## 2020-06-23 DIAGNOSIS — G8929 Other chronic pain: Secondary | ICD-10-CM | POA: Diagnosis present

## 2020-06-23 NOTE — Telephone Encounter (Signed)
Pls pt regarding pain medicine [phone]

## 2020-06-23 NOTE — Telephone Encounter (Signed)
Call returned.

## 2020-06-23 NOTE — Telephone Encounter (Signed)
Dr Darrick Meigs [facility] - Percocet 5/325 mg and Oxycodone 10 mg? Thanks

## 2020-06-24 NOTE — Therapy (Addendum)
[place], [place], [address] Phone: [phone]   Fax:  [phone]  Physical Therapy Evaluation  Patient Details  Name: [name] MRN: [id number] Date of Birth: 1960-09-21 Referring Provider (PT): Aundra Dubin, [place]   Encounter Date: 06/23/2020   PT End of Session - 06/23/20 1626    Visit Number 1    Number of Visits 13    Date for PT Re-Evaluation 08/11/20    Authorization Type [place] MEDICAID HEALTHY BLUE    Progress Note Due on Visit 10    PT Start Time 1403    PT Stop Time 1449    PT Time Calculation (min) 46 min    Equipment Utilized During Treatment Other (comment)   SPC   Activity Tolerance Patient tolerated treatment well    Behavior During Therapy [facility] for tasks assessed/performed           Past Medical History:  Diagnosis Date  . 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 Arthritis   . Diabetes mellitus    oral tx  . Fibromyalgia   . GERD (gastroesophageal reflux disease)   . History of post-sterilization tuboplasty 2000  . Plantar fasciitis    Right  . Shortness of breath    with exertion  . Sleep apnea 5 plus yrs   study -pt could not sleep test inconclusive.   . Tear of meniscus of left knee    x2  . Tear of meniscus of right knee     Past Surgical History:  Procedure Laterality Date  . CARPAL TUNNEL RELEASE Bilateral   . CHOLECYSTECTOMY    . FOOT TENDON SURGERY    . KNEE ARTHROSCOPY  10/10/2011   Procedure: ARTHROSCOPY KNEE;  Surgeon: Newt Minion, MD;  Location: [facility];  Service: Orthopedics;  Laterality: Right;  Right Knee Arthroscopy and Excision Medial Meniscus  . MENISCUS REPAIR     R and L (L x2)  . ROTATOR CUFF REPAIR Left   . TONSILLECTOMY    . TOTAL KNEE ARTHROPLASTY Left 06/06/2020   Procedure: LEFT TOTAL KNEE ARTHROPLASTY;  Surgeon: Leandrew Koyanagi, MD;  Location: [facility];  Service: Orthopedics;  Laterality:  Left;  . TUBAL LIGATION      There were no vitals filed for this visit.    Subjective Assessment - 06/23/20 1416    Subjective Pt reports she is gradually getting better. Standing and walking are better. She states she is working on the bending.    Limitations Walking;House hold activities    Patient Stated Goals Decreased pain and to get around better    Currently in Pain? Yes    Pain Score 6    5-10   Pain Location Knee    Pain Orientation Left    Pain Descriptors / Indicators Aching;Sharp;Throbbing    Pain Type Surgical pain    Pain Onset 1 to 4 weeks ago    Pain Frequency Constant    Aggravating Factors  walking or standing it too much    Pain Relieving Factors cold packs    Effect of Pain on Daily Activities Significant impact              [facility] PT Assessment - 06/24/20 0001      Assessment   Medical Diagnosis Hx of total knee replacement, left    Referring Provider (PT) Aundra Dubin, PA-C    Onset Date/Surgical Date 06/06/20  Hand Dominance Right    Next MD Visit --   approx. 1 month   Prior Therapy Home Health      Precautions   Precautions Knee      Restrictions   Weight Bearing Restrictions No      Balance Screen   Has the patient fallen in the past 6 months Yes    How many times? 6   L knee was giving out   Has the patient had a decrease in activity level because of a fear of falling?  No    Is the patient reluctant to leave their home because of a fear of falling?  No      Home Environment   Living Environment Private residence    Living Arrangements Spouse/significant other;Children    Type of [place] entrance   75 ft   [place] - single point;Walker - 2 wheels;Shower seat;Bedside commode      Prior Function   Level of Independence Independent    Vocation Unemployed    Leisure Caring for husband who is disabled, paraplegic      Cognition   Overall Cognitive Status  Within Functional Limits for tasks assessed      Observation/Other Assessments   Focus on Therapeutic Outcomes (FOTO)  NA      Observation/Other Assessments-Edema    Edema --   Swelling L knee     Sensation   Light Touch Appears Intact      ROM / Strength   AROM / PROM / Strength AROM;Strength      AROM   AROM Assessment Site Knee    Right/Left Knee Left    Left Knee Extension -11   ext lag = -23   Left Knee Flexion 86      Strength   Overall Strength Comments L knee ext 3/5, L knee flex 3+/5; L hip grossly 3/5      Transfers   Transfers Sit to Stand;Stand to Sit    Five time sit to stand comments  15.1    Comments L foot positioned in front of the R to decrease WBing      Ambulation/Gait   Ambulation/Gait Yes    Ambulation/Gait Assistance 6: Modified independent (Device/Increase time)    Assistive device Straight cane    Gait Pattern Step-to pattern;Decreased step length - right;Antalgic      Balance   Balance Assessed Yes      Standardized Balance Assessment   Standardized Balance Assessment Timed Up and Go Test      Timed Up and Go Test   Normal TUG (seconds) 19.9                      Objective measurements completed on examination: See above findings.               PT Education - 06/23/20 1625    Education Details Eval findings, POC, HEP, use of cold packs to manage pain and swelling    Person(s) Educated Patient    Methods Explanation;Demonstration;Tactile cues;Verbal cues    Comprehension Verbalized understanding;Returned demonstration;Verbal cues required;Tactile cues required;Need further instruction            PT Short Term Goals - 06/24/20 9449      PT SHORT TERM GOAL #1   Title pt will be ind in an initial HEP    Status New  Target Date 07/15/20      PT SHORT TERM GOAL #2   Title Pt will voice understanding of measures to reduce/manage L knee pain and swelling    Status New    Target Date 07/15/20              PT Long Term Goals - 06/24/20 0840      PT LONG TERM GOAL #1   Title pt will be Ind in a final HEP to maintain or progress achieved LOF.    Status New    Target Date 08/12/20      PT LONG TERM GOAL #2   Title Increase L knee AROM to -3-115d to improved gait quality and functional mobility    Status New    Target Date 08/12/20      PT LONG TERM GOAL #3   Title Increase L knee strength to 4/5 or greater for improved gait quailty and functional mobility    Status New    Target Date 08/12/20      PT LONG TERM GOAL #4   Title Pt will complete 5 STS c or s use of UEs from a standard chair in 11 sec or less to demonstrate improved functional mobility    Baseline 15.1 sec    Status New    Target Date 08/12/20      PT LONG TERM GOAL #5   Title Pt will complete the TUG s a SPC in 14 sec or less to demonstrate improved functional mobility    Baseline 18.9 sec    Status New    Target Date 08/12/20      Additional Long Term Goals   Additional Long Term Goals Yes      PT LONG TERM GOAL #6   Title Pt's extension lag c SAQ will improve to -10 or less for improved gait quality and functional mobility    Baseline -35d    Status New      PT LONG TERM GOAL #7   Title Pt's gait quailty will improve to a heel/toe pattern c min antalgic limp over the L LE s SPC A.    Baseline Decresaed heel/toe c antalagic gait patternc [facility]    Status New    Target Date 08/12/20                  Plan - 06/23/20 1627    Clinical Impression Statement Pt presents 2 weeks s/p a L TKR. ROM, strength, and mobility are appropriate for time frame after surgery. L knee AROM =-11-86d, L knee strength: ext 3/5, flex 3+/5. Pt walks c a SPC c an antalgic gait pattern with decreased heel/toe pattern and decreased knee ext during stance phase. Pt will benefit from PT 2w6 to increase ROM and strength of the L knee/LE to maximize functional use and mobility of the L LE.    Personal Factors and Comorbidities  Past/Current Experience;Fitness    Examination-Activity Limitations Caring for Others;Carry;Dressing;Lift;Stand;Stairs;Squat;Sleep;Sit;Locomotion Level;Transfers    Examination-Participation Restrictions Cleaning;Other   caring for husband, who is paraplegic   Stability/Clinical Decision Making Stable/Uncomplicated    Clinical Decision Making Low    Rehab Potential Good    PT Frequency 2x / week    PT Duration 6 weeks    PT Treatment/Interventions ADLs/Self Care Home Management;Cryotherapy;Electrical Stimulation;Iontophoresis 4mg /ml Dexamethasone;Moist Heat;Ultrasound;Balance training;Therapeutic exercise;Therapeutic activities;Functional mobility training;Stair training;Gait training;Patient/family education;Manual techniques;Dry needling;Passive range of motion;Taping;Vasopneumatic Device;Joint Manipulations    PT Next Visit Plan Assess repsonse to and review HEP.  Progress ther/HEP as indicated. Use modalities as indicated.    PT Home Exercise Plan [id number]    Consulted and Agree with Plan of Care Patient           Patient will benefit from skilled therapeutic intervention in order to improve the following deficits and impairments:  Difficulty walking,Abnormal gait,Decreased range of motion,Decreased endurance,Obesity,Decreased activity tolerance,Pain,Decreased mobility,Decreased strength,Increased edema  Visit Diagnosis: Hx of total knee replacement, left  Chronic pain of left knee  Difficulty in walking, not elsewhere classified  Localized edema  Muscle weakness (generalized)  Stiffness of left knee, not elsewhere classified     Problem List Patient Active Problem List   Diagnosis Date Noted  . Status post total left knee replacement 06/06/2020  . Primary osteoarthritis of left knee 06/05/2020  . Tendinopathy of rotator cuff, right 09/30/2019  . Obesity (BMI 30-39.9) 04/02/2019  . Urinary incontinence 01/01/2019  . Chronic right shoulder pain 12/01/2018  . Left hip  pain 12/05/2017  . Gluteal tendinitis, left hip 10/04/2017  . Sacroiliac joint pain 09/12/2017  . Right knee pain 04/11/2017  . Spondylosis of lumbar region without myelopathy or radiculopathy 02/18/2017  . Chronic midline low back pain with bilateral sciatica 01/31/2017  . Restless legs syndrome (RLS) 10/24/2016  . Rotator cuff tear, non-traumatic, left 04/26/2016  . Long-term current use of opiate analgesic 03/01/2016  . Hot flashes 12/29/2015  . Carpal tunnel syndrome of right wrist 12/15/2015  . S/P left rotator cuff repair 12/09/2015  . Family history of breast cancer 11/29/2015  . Acromioclavicular joint arthritis 09/19/2015  . Chronic back pain 06/15/2013  . Insomnia 12/02/2012  . Episode of dizziness 06/02/2012  . Fibromyalgia 05/23/2012  . Healthcare maintenance 03/01/2011  . Hyperlipidemia 07/27/2010  . Type 2 diabetes mellitus with retinopathy of both eyes, with long-term current use of insulin ([place]) 07/26/2010  . ADHESIVE CAPSULITIS, RIGHT 09/04/2006  . Other tear of cartilage or meniscus of knee, current 09/04/2006    Gar Ponto MS, PT 06/24/20 10:27 AM  [facility] [address] [place], [place], [address] Phone: [phone]   Fax:  [phone]  Name: [name] MRN: [id number] Date of Birth: 1961/05/20    Check all possible CPT codes: 97110- Therapeutic Exercise, [phone] - Gait Training, [phone] - Manual Therapy, 15947 - Therapeutic Activities, [phone] - [place], [address] - Electrical stimulation (unattended), [id number] - Electrical stimulation (Manual), [id number] - Iontophoresis, [id number] - Ultrasound and [id number] - Vaso

## 2020-06-30 ENCOUNTER — Ambulatory Visit: Payer: Medicaid Other

## 2020-07-01 ENCOUNTER — Ambulatory Visit: Payer: Medicaid Other | Admitting: Internal Medicine

## 2020-07-01 ENCOUNTER — Other Ambulatory Visit: Payer: Self-pay

## 2020-07-01 ENCOUNTER — Encounter: Payer: Self-pay | Admitting: Internal Medicine

## 2020-07-01 VITALS — BP 127/65 | HR 70 | Temp 98.1°F | Ht 61.0 in | Wt 187.3 lb

## 2020-07-01 DIAGNOSIS — Z794 Long term (current) use of insulin: Secondary | ICD-10-CM | POA: Diagnosis not present

## 2020-07-01 DIAGNOSIS — E113393 Type 2 diabetes mellitus with moderate nonproliferative diabetic retinopathy without macular edema, bilateral: Secondary | ICD-10-CM

## 2020-07-01 DIAGNOSIS — M1712 Unilateral primary osteoarthritis, left knee: Secondary | ICD-10-CM | POA: Diagnosis not present

## 2020-07-01 DIAGNOSIS — Z79891 Long term (current) use of opiate analgesic: Secondary | ICD-10-CM | POA: Diagnosis not present

## 2020-07-01 DIAGNOSIS — Z0000 Encounter for general adult medical examination without abnormal findings: Secondary | ICD-10-CM

## 2020-07-01 NOTE — Assessment & Plan Note (Signed)
>>  ASSESSMENT AND PLAN FOR PRIMARY OSTEOARTHRITIS OF LEFT KNEE WRITTEN ON 07/01/2020  4:11 PM BY Elige Radon, MD  S/p left TKA 11/22. She is attending physical therapy and pain is improving. She will continue to follow with orthopedics.

## 2020-07-01 NOTE — Assessment & Plan Note (Addendum)
Current mediations: oxycodone 10mg  every 6 hours as needed, percocet 5-325mg  every four hours as needed for breakthrough pain Indication: severe osteoarthritis Denies significant side effects including sedation and constipation. Medication not interfering with ADLs. It improves her functional status and quality of life. Last UDS was in 2020 and was appropriate. I attempted to collect a UDS from her today however she was unable to produce enough for analysis.  PDMP reviewed and appropriate Pain contract in place yes Discussed alternative pharmacological and non-pharmacological pain relief therapies I counseled the patient on the danger of opioid therapy and that medications should never be taken except as prescribed. We discussed how extreme caution is to be used whenever they are taking their opioid medications, especially as it relates to driving or operating machinery. We discussed that alcohol use with opioid therapy concomitantly is extremely dangerous and prohibited. Patient was told about the risk of respiratory depression, especially with concomitant use of opioid with benzodiazepines and was advised to tell his/her other providers that he/she is receiving opioids from [name]. Finally, it was stated that misuse of opioids can lead to death.   Plan:Will need to check a UDS at the next office visit since she was unable to produce enough urine for collection today. She has been responsible with her narcotics in the past and prior UDS was appropriate so I think it is reasonable to continue her current management at this time.

## 2020-07-01 NOTE — Assessment & Plan Note (Signed)
Mammogram was ordered in September however was delayed due to her knee replacement. She was given the contact information to get this scheduled at today's visit.   Discussed colon cancer screening options, risks, and benefits. She denies a personal or family history of colon cancer. She denies melena or hematachezia. She has elected to use cologuard for her colon cancer screening choice. She understands that if this is positive, she will need to undergo a diagnostic colonoscopy which she is agreeable to.

## 2020-07-01 NOTE — Assessment & Plan Note (Signed)
S/p left TKA 11/22. She is attending physical therapy and pain is improving. She will continue to follow with orthopedics.

## 2020-07-01 NOTE — Progress Notes (Signed)
Office Visit   Patient ID: [name], female    DOB: [birth year], 59 y.o.   MRN: [id number]  Subjective:  CC: chronic diabetes, chronic pain   HPI 59 y.o. presents today for follow up of her chronic medical conditions. Please refer to problem based charting for details, assessment and plan.      ACTIVE MEDICATIONS   Outpatient Medications Prior to Visit  Medication Sig Dispense Refill  . ACCU-CHEK FASTCLIX LANCETS MISC Check three times a day to check blood sugar. 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 insulin dependent 306 each 1  . aspirin EC 81 MG tablet Take 1 tablet (81 mg total) by mouth 2 (two) times daily. To be taken after surgery 84 tablet 0  . atorvastatin (LIPITOR) 20 MG tablet Take 1 tablet (20 mg total) by mouth daily. 90 tablet 1  . Blood Glucose Monitoring Suppl (ACCU-CHEK GUIDE ME) w/Device KIT 1 each by Does not apply route 3 (three) times daily. 1 kit 0  . cetirizine (ZYRTEC) 10 MG tablet Take 1 tablet (10 mg total) by mouth daily. 90 tablet 3  . cyclobenzaprine (FLEXERIL) 10 MG tablet TAKE 1 TABLET BY MOUTH EVERY 12 HOURS AS NEEDED MUSCLE SPASMS 60 tablet 0  . diclofenac sodium (VOLTAREN) 1 % GEL Apply 2 g topically 4 (four) times daily. 3 Tube 3  . diphenhydrAMINE (BENADRYL) 25 mg capsule Take 1 capsule (25 mg total) by mouth every 6 (six) hours as needed. (Patient taking differently: Take 25 mg by mouth every 6 (six) hours as needed for itching (rash). ) 30 capsule 2  . docusate sodium (COLACE) 100 MG capsule Take 1 capsule (100 mg total) by mouth daily as needed. 30 capsule 2  . empagliflozin (JARDIANCE) 10 MG TABS tablet Take 10 mg by mouth daily. 90 mg 3  . glucose blood (ACCU-CHEK GUIDE) test strip Check blood sugar 3 times a day 100 each 12  . insulin aspart (NOVOLOG FLEXPEN) 100 UNIT/ML FlexPen Inject 8 units before each meal three times a day (Patient taking differently: Inject 0-5 Units into the skin 3 (three) times daily with meals. Dose per sliding scale) 15 mL 5  .  insulin glargine (LANTUS) 100 UNIT/ML Solostar Pen Inject 35 Units into the skin at bedtime. Diagnosis code E11.9. 45 mL 3  . Insulin Pen Needle (PEN NEEDLES) 32G X 4 MM MISC 1 each by Does not apply route 5 (five) times daily. 390 each 1  . Insulin Syringe-Needle U-100 31G X 15/64" 0.3 ML MISC Please use for insulin administration 3 time daily. 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 Insulin dependent 200 each 1  . liraglutide (VICTOZA) 18 MG/3ML SOPN Inject 1.2 mg into the skin daily. 9 mL 2  . meclizine (ANTIVERT) 12.5 MG tablet Take 1 tablet (12.5 mg total) by mouth 3 (three) times daily as needed for dizziness. 30 tablet 0  . methocarbamol (ROBAXIN) 500 MG tablet Take 1 tablet (500 mg total) by mouth 2 (two) times daily as needed. To be taken after surgery 20 tablet 0  . ondansetron (ZOFRAN) 4 MG tablet Take 1 tablet (4 mg total) by mouth every 8 (eight) hours as needed for nausea or vomiting. 40 tablet 0  . oxybutynin (DITROPAN-XL) 10 MG 24 hr tablet TAKE 1 TABLET BY MOUTH DAILY 90 tablet 1  . Oxycodone HCl 10 MG TABS Take 1 tablet (10 mg total) by mouth every 6 (six) hours as needed. 120 tablet 0  . [START ON 07/23/2020] Oxycodone HCl 10 MG TABS Take 1 tablet (  10 mg total) by mouth every 6 (six) hours as needed. 120 tablet 0  . [START ON 08/23/2020] Oxycodone HCl 10 MG TABS Take 1 tablet (10 mg total) by mouth every 6 (six) hours as needed. 120 tablet 0  . oxyCODONE-acetaminophen (PERCOCET) 5-325 MG tablet Take 1-2 tablets by mouth every 6 (six) hours as needed. To be taken after surgery 40 tablet 0  . rOPINIRole (REQUIP) 0.5 MG tablet TAKE 1 TABLET BY MOUTH EVERY NIGHT 1 TO 3 HOURS BEFORE BEDTIME (Patient taking differently: Take 0.5 mg by mouth at bedtime. Take 1 TO 3 HOURS BEFORE BEDTIME) 90 tablet 1  . sulfamethoxazole-trimethoprim (BACTRIM DS) 800-160 MG tablet Take 1 tablet by mouth 2 (two) times daily. To be taken after surgery 20 tablet 0   No facility-administered medications prior to visit.     ROS  Review  of Systems  Constitutional: Positive for chills. Negative for fever.  Neurological: Negative for dizziness, syncope, weakness, light-headedness and headaches.  Psychiatric/Behavioral: Negative for behavioral problems.    Objective:   BP 127/65 (BP Location: Left Arm, Patient Position: Sitting, Cuff Size: Normal)   Pulse 70   Temp 98.1 F (36.7 C) (Oral)   Ht _0  (1.549 m)   Wt 187 lb 4.8 oz (85 kg)   LMP 07/04/2017   SpO2 99%   BMI 35.39 kg/m  Wt Readings from Last 3 Encounters:  07/01/20 187 lb 4.8 oz (85 kg)  06/06/20 190 lb 11.2 oz (86.5 kg)  06/02/20 190 lb 9.6 oz (86.5 kg)   BP Readings from Last 3 Encounters:  07/01/20 127/65  06/07/20 137/79  06/02/20 (!) 145/80   Physical Exam Constitutional:      Appearance: Normal appearance.  Musculoskeletal:     Comments: Uses a cane with ambulation. Gait steady. Analgesic gait on the left  Neurological:     General: No focal deficit present.  Psychiatric:        Mood and Affect: Mood normal.     Health Maintenance:   Health Maintenance  Topic Date Due  . COLONOSCOPY  Never done  . MAMMOGRAM  12/25/2017  . PAP SMEAR-Modifier  11/29/2018  . TETANUS/TDAP  08/30/2019  . COVID-19 Vaccine (2 - Booster for [employer] series) 11/22/2019  . OPHTHALMOLOGY EXAM  12/29/2019  . LIPID PANEL  04/01/2020  . URINE MICROALBUMIN  04/01/2020  . HEMOGLOBIN A1C  07/20/2020  . FOOT EXAM  05/02/2021  . INFLUENZA VACCINE  Completed  . PNEUMOCOCCAL POLYSACCHARIDE VACCINE AGE 87-64 HIGH RISK  Completed  . Hepatitis C Screening  Completed  . HIV Screening  Completed     Assessment & Plan:   Problem List Items Addressed This Visit      Endocrine   Type 2 diabetes mellitus with retinopathy of both eyes, with long-term current use of insulin ([place]) - Primary    Current medications: jardiance 612m daily, novolog 8U TID, lantus 35U qHS, victoza 1.270mdaily Denies adverse medication effects. Denies hypoglycemic events. A1C was last  obtained in October which had shown an interval increase from prior. I discussed this with her today who admits to having been eating a lot of sweets around that same time. She has improved her diet since then and notes that her blood sugars are running in the 120s-130s mostly.  Plan: will defer repeating an A1C at today's visit as her last one was 12m10moo. She will follow up with me in 43mo74mowhich time we will repeat another A1C for further  monitoring. I also discussed the importance of retinal screening and she has agreed to go to ophthalmology. Referral was placed for this.      Relevant Orders   Ambulatory referral to Ophthalmology     Musculoskeletal and Integument   Primary osteoarthritis of left knee    S/p left TKA 11/22. She is attending physical therapy and pain is improving. She will continue to follow with orthopedics.        Other   Long-term current use of opiate analgesic (Chronic)    Current mediations: oxycodone 81m every 6 hours as needed, percocet 5-3275mevery four hours as needed for breakthrough pain Indication: severe osteoarthritis Denies significant side effects including sedation and constipation. Medication not interfering with ADLs. It improves her functional status and quality of life. Last UDS was in 2020 and was appropriate. I attempted to collect a UDS from her today however she was unable to produce enough for analysis.  PDMP reviewed and appropriate Pain contract in place yes Discussed alternative pharmacological and non-pharmacological pain relief therapies I counseled the patient on the danger of opioid therapy and that medications should never be taken except as prescribed. We discussed how extreme caution is to be used whenever they are taking their opioid medications, especially as it relates to driving or operating machinery. We discussed that alcohol use with opioid therapy concomitantly is extremely dangerous and prohibited. Patient was told about  the risk of respiratory depression, especially with concomitant use of opioid with benzodiazepines and was advised to tell his/her other providers [place] it was stated that misuse of opioids can lead to death.   Plan:Will need to check a UDS at the next office visit since she was unable to produce enough urine for collection today. She has been responsible with her narcotics in the past and prior UDS was appropriate so I think it is reasonable to continue her current management at this time.       Relevant Orders   [employer]ox[employer] Select,+Antidepr,UR   Healthcare maintenance    Mammogram was ordered in September however was delayed due to her knee replacement. She was given the contact information to get this scheduled at today's visit.   Discussed colon cancer screening options, risks, and benefits. She denies a personal or family history of colon cancer. She denies melena or hematachezia. She has elected to use cologuard for her colon cancer screening choice. She understands that if this is positive, she will need to undergo a diagnostic colonoscopy which she is agreeable to.           Pt discussed with Dr. RaClista BernhardtMD Internal Medicine Resident PGY-2 [facility] Pager: #[phone]/17/2021 4:25 PM

## 2020-07-01 NOTE — Assessment & Plan Note (Signed)
Current medications: jardiance 10mg  daily, novolog 8U TID, lantus 35U qHS, victoza 1.2mg  daily Denies adverse medication effects. Denies hypoglycemic events. A1C was last obtained in October which had shown an interval increase from prior. I discussed this with her today who admits to having been eating a lot of sweets around that same time. She has improved her diet since then and notes that her blood sugars are running in the 120s-130s mostly.  Plan: will defer repeating an A1C at today's visit as her last one was 20mo ago. She will follow up with me in 12mo at which time we will repeat another A1C for further monitoring. I also discussed the importance of retinal screening and she has agreed to go to ophthalmology. Referral was placed for this.

## 2020-07-04 NOTE — Progress Notes (Signed)
Internal Medicine Clinic Attending  Case discussed with Dr. Christian at the time of the visit.  We reviewed the resident's history and exam and pertinent patient test results.  I agree with the assessment, diagnosis, and plan of care documented in the resident's note.  Isaic Syler, M.D., Ph.D.  

## 2020-07-05 ENCOUNTER — Telehealth: Payer: Self-pay | Admitting: Orthopaedic Surgery

## 2020-07-05 ENCOUNTER — Other Ambulatory Visit: Payer: Self-pay | Admitting: Radiology

## 2020-07-05 ENCOUNTER — Other Ambulatory Visit: Payer: Self-pay

## 2020-07-05 DIAGNOSIS — Z96652 Presence of left artificial knee joint: Secondary | ICD-10-CM

## 2020-07-05 NOTE — Telephone Encounter (Signed)
This order has been placed---please schedule, thanks

## 2020-07-05 NOTE — Telephone Encounter (Signed)
Spoke to patient.  Can we order ultrasound of LLE to r/o dvt

## 2020-07-05 NOTE — Telephone Encounter (Signed)
Pt called stating Saturday her leg swelled up and then Sunday she woke up with a bruise on her ankle; she states it's still a little swollen and is warm to the touch. Pt states she may have stood for too long but she would like a CB to discuss what she should do  [phone]

## 2020-07-05 NOTE — Telephone Encounter (Signed)
Order sent for DVT

## 2020-07-05 NOTE — Telephone Encounter (Signed)
Pls advise.  

## 2020-07-06 ENCOUNTER — Ambulatory Visit: Payer: Medicaid Other | Admitting: Rehabilitative and Restorative Service Providers"

## 2020-07-06 NOTE — Telephone Encounter (Signed)
Sent message to [place] with [facility], she will let me know when appt is

## 2020-07-07 ENCOUNTER — Ambulatory Visit: Payer: Medicaid Other | Admitting: Physical Therapy

## 2020-07-12 ENCOUNTER — Other Ambulatory Visit: Payer: Self-pay | Admitting: Internal Medicine

## 2020-07-12 ENCOUNTER — Other Ambulatory Visit: Payer: Self-pay | Admitting: *Deleted

## 2020-07-12 ENCOUNTER — Other Ambulatory Visit: Payer: Self-pay | Admitting: Student

## 2020-07-12 ENCOUNTER — Ambulatory Visit: Payer: Medicaid Other

## 2020-07-12 DIAGNOSIS — G2581 Restless legs syndrome: Secondary | ICD-10-CM

## 2020-07-12 DIAGNOSIS — E119 Type 2 diabetes mellitus without complications: Secondary | ICD-10-CM

## 2020-07-12 NOTE — Telephone Encounter (Signed)
She was taken off this and switched to voltaren gel at last hospital admission.  Please have her use voltaren gel.

## 2020-07-13 ENCOUNTER — Telehealth: Payer: Self-pay | Admitting: Orthopaedic Surgery

## 2020-07-13 NOTE — Telephone Encounter (Signed)
Patient called requesting a call back from Dr. Roda Shutters assistance. Patient states every time she walks there is a popping sound in her foot and need medical advice Please call patient about this matter at  [phone].

## 2020-07-13 NOTE — Telephone Encounter (Signed)
See below. Do you want patient to come back in for repeat eval with these complaints?

## 2020-07-14 ENCOUNTER — Ambulatory Visit: Payer: Medicaid Other | Admitting: Rehabilitative and Restorative Service Providers"

## 2020-07-14 NOTE — Telephone Encounter (Signed)
yes

## 2020-07-14 NOTE — Telephone Encounter (Signed)
Appt scheduled 01/04

## 2020-07-19 ENCOUNTER — Ambulatory Visit: Payer: Medicaid Other

## 2020-07-19 ENCOUNTER — Ambulatory Visit: Payer: Medicaid Other | Admitting: Orthopaedic Surgery

## 2020-07-21 ENCOUNTER — Ambulatory Visit: Payer: Medicaid Other

## 2020-07-25 ENCOUNTER — Other Ambulatory Visit: Payer: Medicaid Other

## 2020-07-25 ENCOUNTER — Telehealth: Payer: Self-pay

## 2020-07-25 NOTE — Telephone Encounter (Signed)
  Oxycodone HCl 10 MG TABS, refill request @  [id number] [place], [place]. Phone:  [phone]  Fax:  [phone]

## 2020-07-25 NOTE — Telephone Encounter (Signed)
Rx sent 06/22/2020 to start on 07/23/2020. Patient notified to call pharmacy for refill. Hubbard Hartshorn, BSN, RN-BC

## 2020-07-26 ENCOUNTER — Ambulatory Visit: Payer: Medicaid Other | Admitting: Orthopaedic Surgery

## 2020-07-26 ENCOUNTER — Telehealth: Payer: Self-pay

## 2020-07-26 ENCOUNTER — Ambulatory Visit: Payer: Medicaid Other

## 2020-07-26 ENCOUNTER — Telehealth: Payer: Self-pay | Admitting: *Deleted

## 2020-07-26 NOTE — Telephone Encounter (Signed)
Pt is requesting her   Oxycodone HCl 10 MG TABS to be sent to  [id number] [place], [place]. Phone:  [phone]  Fax:  [phone]     ( pt stated that the pharmacy told  her they have the script, they just need a prior auth )

## 2020-07-26 NOTE — Telephone Encounter (Signed)
Information was called to [facility] for PA for Oxycodone 10 mg Awaiting determination within 24 hours.  Sander Nephew, RN 07/26/2020 11:13 AM

## 2020-07-27 ENCOUNTER — Other Ambulatory Visit: Payer: Self-pay

## 2020-07-27 DIAGNOSIS — G2581 Restless legs syndrome: Secondary | ICD-10-CM

## 2020-07-27 MED ORDER — ROPINIROLE HCL 0.5 MG PO TABS: 0.5000 mg | ORAL_TABLET | Freq: Every day | ORAL | 2 refills | Status: DC

## 2020-07-28 ENCOUNTER — Ambulatory Visit: Payer: Medicaid Other | Admitting: Physical Therapy

## 2020-07-29 ENCOUNTER — Other Ambulatory Visit: Payer: Self-pay | Admitting: *Deleted

## 2020-07-29 DIAGNOSIS — Z794 Long term (current) use of insulin: Secondary | ICD-10-CM

## 2020-07-29 DIAGNOSIS — E119 Type 2 diabetes mellitus without complications: Secondary | ICD-10-CM

## 2020-07-29 MED ORDER — PEN NEEDLES 32G X 4 MM MISC: 1.0000 | Freq: Every day | 1 refills | Status: DC

## 2020-07-29 MED ORDER — DICLOFENAC SODIUM 3 % EX GEL: 2.0000 g | Freq: Three times a day (TID) | CUTANEOUS | 4 refills | Status: DC

## 2020-07-29 NOTE — Telephone Encounter (Signed)
Patient called in requesting refill on pen needles and diclofenac gel. Diclofenac Rx has expired. Please place new order. Thank you. Hubbard Hartshorn, BSN, RN-BC

## 2020-08-02 ENCOUNTER — Other Ambulatory Visit: Payer: Self-pay | Admitting: Student

## 2020-08-04 ENCOUNTER — Ambulatory Visit (INDEPENDENT_AMBULATORY_CARE_PROVIDER_SITE_OTHER): Payer: Medicaid Other

## 2020-08-04 ENCOUNTER — Ambulatory Visit (INDEPENDENT_AMBULATORY_CARE_PROVIDER_SITE_OTHER): Payer: Medicaid Other | Admitting: Orthopaedic Surgery

## 2020-08-04 ENCOUNTER — Other Ambulatory Visit: Payer: Self-pay

## 2020-08-04 DIAGNOSIS — M25572 Pain in left ankle and joints of left foot: Secondary | ICD-10-CM

## 2020-08-04 DIAGNOSIS — Z96652 Presence of left artificial knee joint: Secondary | ICD-10-CM

## 2020-08-04 NOTE — Progress Notes (Signed)
Post-Op Visit Note   Patient: [name]           Date of Birth: 05/27/1961           MRN: [id number] Visit Date: 08/04/2020 PCP: Mitzi Hansen, MD   Assessment & Plan:  Visit Diagnoses:  1. Status post total left knee replacement     Plan:   Patient is 2 months status post left total knee replacement.  She has been feeling some popping on both sides of the patella.  She did have a fall on Sunday on the ice.  She has not done physical therapy in some time because of COVID in the family.  She has been doing home exercises on her own and she feels that her range of motion and strength are both improving quite well.  Examination of the left knee shows a fully healed surgical scar.  Range of motion is 0 to 110 degrees.  Stable to varus valgus.  Mild quadriceps atrophy.  Good quad activation overall.  Ambulates with a single-point cane.  Slight limp.  X-rays demonstrate stable total knee replacement without complications.  Patient is doing very well for her 8-week visit.  DVT prophylaxis can be discontinued at this time.  Dental prophylaxis reinforced.  I encouraged her to continue her home exercises as she has made significant improvement.  We will see her back in 6 weeks for recheck.    Follow-Up Instructions: Return in about 6 weeks (around 09/15/2020).   Orders:  Orders Placed This Encounter  Procedures  . XR Knee 1-2 Views Left   No orders of the defined types were placed in this encounter.   Imaging: XR Knee 1-2 Views Left  Result Date: 08/04/2020 Stable total knee replacement in good alignment.    PMFS History: Patient Active Problem List   Diagnosis Date Noted  . Status post total left knee replacement 06/06/2020  . Primary osteoarthritis of left knee 06/05/2020  . Tendinopathy of rotator cuff, right 09/30/2019  . Obesity (BMI 30-39.9) 04/02/2019  . Urinary incontinence 01/01/2019  . Chronic right shoulder pain 12/01/2018  . Left hip pain 12/05/2017   . Gluteal tendinitis, left hip 10/04/2017  . Sacroiliac joint pain 09/12/2017  . Right knee pain 04/11/2017  . Spondylosis of lumbar region without myelopathy or radiculopathy 02/18/2017  . Chronic midline low back pain with bilateral sciatica 01/31/2017  . Restless legs syndrome (RLS) 10/24/2016  . Rotator cuff tear, non-traumatic, left 04/26/2016  . Long-term current use of opiate analgesic 03/01/2016  . Carpal tunnel syndrome of right wrist 12/15/2015  . S/P left rotator cuff repair 12/09/2015  . Family history of breast cancer 11/29/2015  . Acromioclavicular joint arthritis 09/19/2015  . Chronic back pain 06/15/2013  . Insomnia 12/02/2012  . Fibromyalgia 05/23/2012  . Healthcare maintenance 03/01/2011  . Hyperlipidemia 07/27/2010  . Type 2 diabetes mellitus with retinopathy of both eyes, with long-term current use of insulin ([place]) 07/26/2010  . ADHESIVE CAPSULITIS, RIGHT 09/04/2006  . Other tear of cartilage or meniscus of knee, current 09/04/2006   Past Medical History:  Diagnosis Date  . 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 Arthritis   . Diabetes mellitus    oral tx  . Fibromyalgia   . GERD (gastroesophageal reflux disease)   . History of post-sterilization tuboplasty 2000  . Plantar fasciitis    Right  . Shortness of breath    with exertion  . Sleep apnea 5  plus yrs   study -pt could not sleep test inconclusive.   . Tear of meniscus of left knee    x2  . Tear of meniscus of right knee     Family History  Problem Relation Age of Onset  . Breast cancer Sister        both sisters have breast cancer    Past Surgical History:  Procedure Laterality Date  . CARPAL TUNNEL RELEASE Bilateral   . CHOLECYSTECTOMY    . FOOT TENDON SURGERY    . KNEE ARTHROSCOPY  10/10/2011   Procedure: ARTHROSCOPY KNEE;  Surgeon: Newt Minion, MD;  Location: [facility];  Service: Orthopedics;  Laterality: Right;  Right Knee Arthroscopy and  Excision Medial Meniscus  . MENISCUS REPAIR     R and L (L x2)  . ROTATOR CUFF REPAIR Left   . TONSILLECTOMY    . TOTAL KNEE ARTHROPLASTY Left 06/06/2020   Procedure: LEFT TOTAL KNEE ARTHROPLASTY;  Surgeon: Leandrew Koyanagi, MD;  Location: [facility];  Service: Orthopedics;  Laterality: Left;  . TUBAL LIGATION     Social History   Occupational History  . Occupation: unemployed    Fish farm manager: UNEMPLOYED  Tobacco Use  . Smoking status: Never Smoker  . Smokeless tobacco: Never Used  Vaping Use  . Vaping Use: Never used  Substance and Sexual Activity  . Alcohol use: No    Alcohol/week: 0.0 standard drinks  . Drug use: No  . Sexual activity: Not on file

## 2020-08-04 NOTE — Telephone Encounter (Signed)
Robaxin previously filled by orthopedic office (pt scheduled to see today).  [facility] will deny request at this time and have pharmacy forward request to orthopedic office.[name]1/20/202210:55 AM

## 2020-08-04 NOTE — Telephone Encounter (Signed)
Pt calling about several issues but asking about PA on Oxycodone. I will send message to [facility].

## 2020-08-04 NOTE — Telephone Encounter (Signed)
Denial from [facility] for Oxycodone on 07/31/2020.  Stated patient no longer meets criteria to continue medication with documentation provided. Recent office notes were provided with the PA request.

## 2020-08-04 NOTE — Telephone Encounter (Signed)
Pt informed of denial b/c "Stated patient no longer meets criteria to continue medication with documentation provided."

## 2020-08-09 ENCOUNTER — Ambulatory Visit
Admission: RE | Admit: 2020-08-09 | Discharge: 2020-08-09 | Disposition: A | Discharge status: Home / Self Care | Payer: Medicaid Other | Source: Ambulatory Visit | Attending: Podiatry | Admitting: Podiatry

## 2020-08-09 DIAGNOSIS — M7989 Other specified soft tissue disorders: Secondary | ICD-10-CM

## 2020-08-11 ENCOUNTER — Encounter: Payer: Medicaid Other | Admitting: Student

## 2020-08-15 ENCOUNTER — Telehealth: Payer: Self-pay | Admitting: Podiatry

## 2020-08-15 ENCOUNTER — Telehealth: Payer: Self-pay | Admitting: Orthopaedic Surgery

## 2020-08-15 NOTE — Telephone Encounter (Signed)
Pt called stating she has a dentist appt coming up and she's gonna need an antibiotic since she had surgery not to long ago. Pt also mentioned that she's starting to geta rash on half her L knee and she would like a CB to discuss this further.   [phone]

## 2020-08-15 NOTE — Telephone Encounter (Signed)
Pt requested her ultrasound results, and would like to know next steps in treatment. Please advise.

## 2020-08-15 NOTE — Telephone Encounter (Signed)
I called and went over the results. She cannot log into MyChart so she didn't see my message. Will schedule for excision.

## 2020-08-15 NOTE — Telephone Encounter (Signed)
See message.  Thanks.

## 2020-08-16 ENCOUNTER — Other Ambulatory Visit: Payer: Self-pay | Admitting: Physician Assistant

## 2020-08-16 MED ORDER — AMOXICILLIN 500 MG PO CAPS: ORAL_CAPSULE | ORAL | 1 refills | Status: DC

## 2020-08-16 NOTE — Telephone Encounter (Signed)
I sent in the amoxicillin.  Does she have a rash anywhere else, or just the knee?

## 2020-08-16 NOTE — Telephone Encounter (Signed)
Ok, thanks    If having surgery, they will give in OR

## 2020-08-16 NOTE — Telephone Encounter (Signed)
Pt fell and is having more issues with knee she will come for eval.   Will be having foot SU soon. Will she need more abx?

## 2020-08-17 ENCOUNTER — Ambulatory Visit: Payer: Medicaid Other | Admitting: Physician Assistant

## 2020-08-18 ENCOUNTER — Encounter: Payer: Self-pay | Admitting: Physician Assistant

## 2020-08-18 ENCOUNTER — Ambulatory Visit: Payer: Medicaid Other | Admitting: Podiatry

## 2020-08-18 ENCOUNTER — Other Ambulatory Visit: Payer: Self-pay | Admitting: Physician Assistant

## 2020-08-18 ENCOUNTER — Ambulatory Visit (INDEPENDENT_AMBULATORY_CARE_PROVIDER_SITE_OTHER): Payer: Medicaid Other | Admitting: Physician Assistant

## 2020-08-18 ENCOUNTER — Ambulatory Visit (INDEPENDENT_AMBULATORY_CARE_PROVIDER_SITE_OTHER): Payer: Medicaid Other

## 2020-08-18 DIAGNOSIS — Z96652 Presence of left artificial knee joint: Secondary | ICD-10-CM

## 2020-08-18 MED ORDER — HYDROCODONE-ACETAMINOPHEN 5-325 MG PO TABS: 1.0000 | ORAL_TABLET | Freq: Two times a day (BID) | ORAL | 0 refills | Status: DC | PRN

## 2020-08-18 MED ORDER — DICLOFENAC SODIUM 75 MG PO TBEC: 75.0000 mg | DELAYED_RELEASE_TABLET | Freq: Two times a day (BID) | ORAL | 0 refills | Status: DC | PRN

## 2020-08-18 MED ORDER — METHOCARBAMOL 500 MG PO TABS: 500.0000 mg | ORAL_TABLET | Freq: Two times a day (BID) | ORAL | 0 refills | Status: DC | PRN

## 2020-08-18 NOTE — Progress Notes (Unsigned)
Post-Op Visit Note   Patient: [name]           Date of Birth: [birth year]           MRN: [id number] Visit Date: 08/18/2020 PCP: Mitzi Hansen, MD   Assessment & Plan:  Chief Complaint:  Chief Complaint  Patient presents with  . Left Knee - Pain   Visit Diagnoses:  1. Status post total left knee replacement     Plan: Patient is a very pleasant 60 year old female who presents to our clinic today with concerns about her left knee.  She is approximately 2 months out left total knee replacement.  She has been doing fairly well with the left knee but sustained a mechanical fall after slipping on ice approximately 4 days ago.  She landed face first with both her knees hitting the ground.  She has had increased pain to the medial lateral aspects with walking for long period of time since the fall.  She has been taking over the counter medications which have not seemed to help.  She also notes a rash that came up to the medial aspect of the anterior knee after the fall.  In the past, she has had rashes that come up over her body following any sort of stressful event.  This rash has started to clear at this point.  Examination of the left knee reveals a fully healed surgical scar without complication.  Range of motion from about 5 degrees of extension to 115 degrees of flexion.  She is stable back to varus stress.  She does have moderate tenderness along the medial joint line.  She is neurovascularly intact distally.  At this point, I do not see anything untoward on x-rays.  I recommended starting her on anti-inflammatories and having her back off activity for the next week.  She will continue to ice and elevate as well.  She will follow up with [name] in 3 weeks time for her 52-month postop appointment and at that point if she is still having pain we will repeat three-view x-rays of the left knee.  She will call with concerns or questions in the meantime.  Follow-Up Instructions: Return in  about 3 weeks (around 09/08/2020).   Orders:  Orders Placed This Encounter  Procedures  . XR KNEE 3 VIEW LEFT   Meds ordered this encounter  Medications  . methocarbamol (ROBAXIN) 500 MG tablet    Sig: Take 1 tablet (500 mg total) by mouth 2 (two) times daily as needed.    Dispense:  20 tablet    Refill:  0  . HYDROcodone-acetaminophen (NORCO) 5-325 MG tablet    Sig: Take 1 tablet by mouth 2 (two) times daily as needed.    Dispense:  20 tablet    Refill:  0  . diclofenac (VOLTAREN) 75 MG EC tablet    Sig: Take 1 tablet (75 mg total) by mouth 2 (two) times daily as needed.    Dispense:  60 tablet    Refill:  0    Imaging: Well-seated prosthesis without evidence of fracture or loosening of the prosthesis  PMFS History: Patient Active Problem List   Diagnosis Date Noted  . Status post total left knee replacement 06/06/2020  . Primary osteoarthritis of left knee 06/05/2020  . Tendinopathy of rotator cuff, right 09/30/2019  . Obesity (BMI 30-39.9) 04/02/2019  . Urinary incontinence 01/01/2019  . Chronic right shoulder pain 12/01/2018  . Left hip pain 12/05/2017  . Gluteal tendinitis, left  hip 10/04/2017  . Sacroiliac joint pain 09/12/2017  . Right knee pain 04/11/2017  . Spondylosis of lumbar region without myelopathy or radiculopathy 02/18/2017  . Chronic midline low back pain with bilateral sciatica 01/31/2017  . Restless legs syndrome (RLS) 10/24/2016  . Rotator cuff tear, non-traumatic, left 04/26/2016  . Long-term current use of opiate analgesic 03/01/2016  . Carpal tunnel syndrome of right wrist 12/15/2015  . S/P left rotator cuff repair 12/09/2015  . Family history of breast cancer 11/29/2015  . Acromioclavicular joint arthritis 09/19/2015  . Chronic back pain 06/15/2013  . Insomnia 12/02/2012  . Fibromyalgia 05/23/2012  . Healthcare maintenance 03/01/2011  . Hyperlipidemia 07/27/2010  . Type 2 diabetes mellitus with retinopathy of both eyes, with long-term  current use of insulin ([place]) 07/26/2010  . ADHESIVE CAPSULITIS, RIGHT 09/04/2006  . Other tear of cartilage or meniscus of knee, current 09/04/2006   Past Medical History:  Diagnosis Date  . 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 Arthritis   . Diabetes mellitus    oral tx  . Fibromyalgia   . GERD (gastroesophageal reflux disease)   . History of post-sterilization tuboplasty 2000  . Plantar fasciitis    Right  . Shortness of breath    with exertion  . Sleep apnea 5 plus yrs   study -pt could not sleep test inconclusive.   . Tear of meniscus of left knee    x2  . Tear of meniscus of right knee     Family History  Problem Relation Age of Onset  . Breast cancer Sister        both sisters have breast cancer    Past Surgical History:  Procedure Laterality Date  . CARPAL TUNNEL RELEASE Bilateral   . CHOLECYSTECTOMY    . FOOT TENDON SURGERY    . KNEE ARTHROSCOPY  10/10/2011   Procedure: ARTHROSCOPY KNEE;  Surgeon: Newt Minion, MD;  Location: [facility];  Service: Orthopedics;  Laterality: Right;  Right Knee Arthroscopy and Excision Medial Meniscus  . MENISCUS REPAIR     R and L (L x2)  . ROTATOR CUFF REPAIR Left   . TONSILLECTOMY    . TOTAL KNEE ARTHROPLASTY Left 06/06/2020   Procedure: LEFT TOTAL KNEE ARTHROPLASTY;  Surgeon: Leandrew Koyanagi, MD;  Location: [facility];  Service: Orthopedics;  Laterality: Left;  . TUBAL LIGATION     Social History   Occupational History  . Occupation: unemployed    Fish farm manager: UNEMPLOYED  Tobacco Use  . Smoking status: Never Smoker  . Smokeless tobacco: Never Used  Vaping Use  . Vaping Use: Never used  Substance and Sexual Activity  . Alcohol use: No    Alcohol/week: 0.0 standard drinks  . Drug use: No  . Sexual activity: Not on file

## 2020-08-19 ENCOUNTER — Telehealth: Payer: Self-pay | Admitting: Orthopaedic Surgery

## 2020-08-19 ENCOUNTER — Other Ambulatory Visit: Payer: Self-pay | Admitting: Physician Assistant

## 2020-08-19 MED ORDER — MELOXICAM 7.5 MG PO TABS: 7.5000 mg | ORAL_TABLET | Freq: Every day | ORAL | 2 refills | Status: DC | PRN

## 2020-08-19 MED ORDER — TRAMADOL HCL 50 MG PO TABS
50.0000 mg | ORAL_TABLET | Freq: Three times a day (TID) | ORAL | 0 refills | Status: DC | PRN
Start: 2020-08-19 — End: 2020-09-21

## 2020-08-19 NOTE — Telephone Encounter (Signed)
I called patient and advised. 

## 2020-08-19 NOTE — Telephone Encounter (Signed)
Pt called stating meloxicam works better for her than the voltaren and she would like tramadol called in instead of the norco because she doesn't want to be on anything too strong.   [phone]

## 2020-08-19 NOTE — Telephone Encounter (Signed)
Sent in

## 2020-08-23 ENCOUNTER — Telehealth: Payer: Self-pay | Admitting: Orthopaedic Surgery

## 2020-08-23 NOTE — Telephone Encounter (Signed)
Pt called and the pharmacist said that she has prior authorization to fill her pain medication.

## 2020-08-24 NOTE — Telephone Encounter (Signed)
She picked up Rx. Couldn't do PA through [place] [facility] yesterday--system was down. Today I tried and Recipient ID was incorrect. Patient will bring new medicaid card at the next visit.

## 2020-08-30 ENCOUNTER — Inpatient Hospital Stay: Admission: RE | Admit: 2020-08-30 | Payer: Medicaid Other | Source: Ambulatory Visit

## 2020-08-31 ENCOUNTER — Other Ambulatory Visit: Payer: Self-pay | Admitting: Internal Medicine

## 2020-08-31 LAB — HM DIABETES EYE EXAM

## 2020-08-31 NOTE — Telephone Encounter (Signed)
Refill Request  cyclobenzaprine (FLEXERIL) 10 MG tablet  [id number] [place], [place] - [address] (Ph: [phone])

## 2020-09-01 ENCOUNTER — Encounter: Payer: Self-pay | Admitting: *Deleted

## 2020-09-07 ENCOUNTER — Ambulatory Visit: Payer: Medicaid Other

## 2020-09-08 ENCOUNTER — Ambulatory Visit: Payer: Medicaid Other | Admitting: Podiatry

## 2020-09-08 ENCOUNTER — Ambulatory Visit: Payer: Medicaid Other | Admitting: Orthopaedic Surgery

## 2020-09-09 ENCOUNTER — Ambulatory Visit: Payer: Medicaid Other | Admitting: Orthopaedic Surgery

## 2020-09-14 ENCOUNTER — Other Ambulatory Visit: Payer: Self-pay

## 2020-09-14 NOTE — Telephone Encounter (Signed)
She will need to follow up prior to refills. It looks like script should run out 3/10. Front desk--please call to help pt arrange this.

## 2020-09-14 NOTE — Telephone Encounter (Signed)
Need refill on HYDROcodone-acetaminophen (NORCO) 5-325 MG tablet ;pt contact [phone]  [id number] [place] [place] - [address]

## 2020-09-15 ENCOUNTER — Telehealth: Payer: Self-pay | Admitting: Orthopaedic Surgery

## 2020-09-15 NOTE — Telephone Encounter (Signed)
Yeah.  Just needs appt with [facility].

## 2020-09-15 NOTE — Telephone Encounter (Signed)
Attempted to contact patient 2 times this morning each time message comes on stating call cannot be completed at this time.

## 2020-09-15 NOTE — Telephone Encounter (Signed)
Patient called asked if she can get another injection in her left hip. Patient said she is having a lot of pain. The number to contact patient is [phone]

## 2020-09-16 NOTE — Telephone Encounter (Signed)
Appt made with [facility] 3/9

## 2020-09-16 NOTE — Telephone Encounter (Signed)
Can you make appt with Dr Junius Roads for hip inj only. Thank you.

## 2020-09-20 ENCOUNTER — Ambulatory Visit: Payer: Medicaid Other | Admitting: Orthopaedic Surgery

## 2020-09-20 ENCOUNTER — Ambulatory Visit: Payer: Medicaid Other | Admitting: Podiatry

## 2020-09-21 ENCOUNTER — Ambulatory Visit: Payer: Medicaid Other | Admitting: Family Medicine

## 2020-09-21 ENCOUNTER — Other Ambulatory Visit: Payer: Self-pay | Admitting: Student

## 2020-09-21 ENCOUNTER — Other Ambulatory Visit: Payer: Self-pay | Admitting: Internal Medicine

## 2020-09-21 ENCOUNTER — Ambulatory Visit: Payer: Medicaid Other | Admitting: Internal Medicine

## 2020-09-21 ENCOUNTER — Ambulatory Visit: Payer: Self-pay

## 2020-09-21 ENCOUNTER — Other Ambulatory Visit: Payer: Self-pay

## 2020-09-21 VITALS — BP 125/69 | HR 99 | Temp 98.3°F | Ht 61.0 in | Wt 180.1 lb

## 2020-09-21 DIAGNOSIS — Z79891 Long term (current) use of opiate analgesic: Secondary | ICD-10-CM | POA: Diagnosis not present

## 2020-09-21 DIAGNOSIS — M1612 Unilateral primary osteoarthritis, left hip: Secondary | ICD-10-CM

## 2020-09-21 DIAGNOSIS — Z794 Long term (current) use of insulin: Secondary | ICD-10-CM | POA: Diagnosis not present

## 2020-09-21 DIAGNOSIS — N3941 Urge incontinence: Secondary | ICD-10-CM

## 2020-09-21 DIAGNOSIS — E669 Obesity, unspecified: Secondary | ICD-10-CM | POA: Diagnosis not present

## 2020-09-21 DIAGNOSIS — E785 Hyperlipidemia, unspecified: Secondary | ICD-10-CM | POA: Diagnosis not present

## 2020-09-21 DIAGNOSIS — Z1211 Encounter for screening for malignant neoplasm of colon: Secondary | ICD-10-CM

## 2020-09-21 DIAGNOSIS — E113393 Type 2 diabetes mellitus with moderate nonproliferative diabetic retinopathy without macular edema, bilateral: Secondary | ICD-10-CM

## 2020-09-21 DIAGNOSIS — G2581 Restless legs syndrome: Secondary | ICD-10-CM

## 2020-09-21 DIAGNOSIS — E119 Type 2 diabetes mellitus without complications: Secondary | ICD-10-CM

## 2020-09-21 LAB — POCT GLYCOSYLATED HEMOGLOBIN (HGB A1C): Hemoglobin A1C: 7.6 % — AB (ref 4.0–5.6)

## 2020-09-21 LAB — GLUCOSE, CAPILLARY: Glucose-Capillary: 198 mg/dL — ABNORMAL HIGH (ref 70–99)

## 2020-09-21 MED ORDER — LIRAGLUTIDE 18 MG/3ML ~~LOC~~ SOPN
1.2000 mg | PEN_INJECTOR | Freq: Every day | SUBCUTANEOUS | 2 refills | Status: DC
Start: 2020-09-21 — End: 2020-12-23

## 2020-09-21 MED ORDER — OXYCODONE HCL 10 MG PO TABS: 10.0000 mg | ORAL_TABLET | Freq: Four times a day (QID) | ORAL | 0 refills | Status: AC | PRN

## 2020-09-21 MED ORDER — OXYBUTYNIN CHLORIDE ER 10 MG PO TB24: 10.0000 mg | ORAL_TABLET | Freq: Every day | ORAL | 1 refills | Status: DC

## 2020-09-21 MED ORDER — CYCLOBENZAPRINE HCL 10 MG PO TABS: 10.0000 mg | ORAL_TABLET | Freq: Two times a day (BID) | ORAL | 0 refills | Status: AC | PRN

## 2020-09-21 MED ORDER — ROPINIROLE HCL 0.5 MG PO TABS
0.5000 mg | ORAL_TABLET | Freq: Every day | ORAL | 2 refills | Status: DC
Start: 2020-09-21 — End: 2020-12-23

## 2020-09-21 MED ORDER — ASPIRIN EC 81 MG PO TBEC
81.0000 mg | DELAYED_RELEASE_TABLET | Freq: Every day | ORAL | 0 refills | Status: DC
Start: 2020-09-21 — End: 2021-04-08

## 2020-09-21 MED ORDER — EMPAGLIFLOZIN 25 MG PO TABS: 25.0000 mg | ORAL_TABLET | Freq: Every day | ORAL | 3 refills | Status: DC

## 2020-09-21 NOTE — Assessment & Plan Note (Signed)
Current medications: lipitor 20mg  Will obtain a lipid panel today

## 2020-09-21 NOTE — Assessment & Plan Note (Signed)
Symptoms have been a little worse lately. No recent changes to requip or flexeril doses.  -will check an iron panel to r/o iron deficiency

## 2020-09-21 NOTE — Assessment & Plan Note (Addendum)
Current mediations: oxycodone 10mg  q6h prn, flexeril 10mg  BID prn, mobic 7.5mg   Daily, requip 0.5mg  QHS Indication: chronic severe osteoarthritis and fibromyalgia Denies significant side effects including sedation and constipation. Medication not interfering with ADLs Last UDS was in 2020 and appropriate PDMP reviewed and appropriate Opioid contract signed at today's visit. She has also been receiving percocet from orthopedics. Discussed that all controlled substances need to come through our office from now on.  Discussed alternative pharmacological and non-pharmacological pain relief therapies I counseled the patient on the danger of opioid therapy and that medications should never be taken except as prescribed. We discussed how extreme caution is to be used whenever they are taking their opioid medications, especially as it relates to driving or operating machinery. We discussed that alcohol use with opioid therapy concomitantly is extremely dangerous and prohibited. Patient was told about the risk of respiratory depression, especially with concomitant use of opioid with benzodiazepines and was advised to tell his/her other providers that he/she is receiving opioids from [name]. Finally, it was stated that misuse of opioids can lead to death.  Plan:  -continue current management. Three oxycodone scripts sent to her pharmacy--#120 -UDS collected at today's visit -bowel management with colace  Addendum: UDS appropriately positive for oxycodone. Hydrocodone was also present and reported as unexpected as I was under the impression she was no longer taking it. She was last prescribed hydrocodone in February to be taken with physical therapy. I suspect that she took one prior to her hip injection earlier today, resulting in the UDS results.  Plan -continue oxycodone 10mg  q6h as needed -repeat UDS in 6-12 months

## 2020-09-21 NOTE — Assessment & Plan Note (Signed)
Current medications: victoza 1.2mg  daily, jardiance 10mg  daily, lantus 35U daily, novolog mealtime SSI Pt notes that she has not been using her novolog recently. She is inconsistent with lantus use as well. Fasting glucoses are typically in the 80s-90s. She occasionally checks her blood sugars and infrequently experiences hypoglycemia with symptoms. A1C is down 7.9>7.6 Foot exam performed in the last 12 months. Next due in October Retinal exam is due.  Plan -DISCONTINUE lantus and novolog -INCREASE Jardiance to 25mg  -continue victoza 1.2mg  -she is aware of need for retinal exam -urine microalbumin/creatinine collected today to screen for proteinuria -she is aware to call us if her blood sugars become very elevated with stopping lantus -f/u in 3 months

## 2020-09-21 NOTE — Progress Notes (Signed)
Office Visit   Patient ID: [name], female    DOB: March 26, 1961, 60 y.o.   MRN: [id number]  Subjective:  CC: fibromyalgia with chronic pain on opioids, type 2 DM, restless legs  HPI 60 y.o. presents today for follow up of chronic medical conditions. She has no acute complaints at today's visit. Please refer to problem based charting.      ACTIVE MEDICATIONS   Outpatient Medications Prior to Visit  Medication Sig Dispense Refill  . meloxicam (MOBIC) 7.5 MG tablet Take 1 tablet (7.5 mg total) by mouth daily as needed for up to 60 doses for pain. 60 tablet 2  . ACCU-CHEK FASTCLIX LANCETS MISC Check three times a day to check blood sugar. 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 insulin dependent 306 each 1  . atorvastatin (LIPITOR) 20 MG tablet Take 1 tablet (20 mg total) by mouth daily. 90 tablet 1  . Blood Glucose Monitoring Suppl (ACCU-CHEK GUIDE ME) w/Device KIT 1 each by Does not apply route 3 (three) times daily. 1 kit 0  . cetirizine (ZYRTEC) 10 MG tablet Take 1 tablet (10 mg total) by mouth daily. 90 tablet 3  . Diclofenac Sodium 3 % GEL Apply 2 g topically 3 (three) times daily. 100 g 4  . docusate sodium (COLACE) 100 MG capsule Take 1 capsule (100 mg total) by mouth daily as needed. 30 capsule 2  . glucose blood (ACCU-CHEK GUIDE) test strip Check blood sugar 3 times a day 100 each 12  . meclizine (ANTIVERT) 12.5 MG tablet Take 1 tablet (12.5 mg total) by mouth 3 (three) times daily as needed for dizziness. 30 tablet 0  . Oxycodone HCl 10 MG TABS Take 1 tablet (10 mg total) by mouth every 6 (six) hours as needed. 120 tablet 0  . amoxicillin (AMOXIL) 500 MG capsule Take 4 tabs one hour prior to dental work 8 capsule 1  . aspirin EC 81 MG tablet Take 1 tablet (81 mg total) by mouth 2 (two) times daily. To be taken after surgery 84 tablet 0  . cyclobenzaprine (FLEXERIL) 10 MG tablet TAKE 1 TABLET BY MOUTH EVERY 12 HOURS AS NEEDED MUSCLE SPASMS 60 tablet 0  . diclofenac (VOLTAREN) 75 MG EC tablet  Take 1 tablet (75 mg total) by mouth 2 (two) times daily as needed. 60 tablet 0  . diphenhydrAMINE (BENADRYL) 25 mg capsule Take 1 capsule (25 mg total) by mouth every 6 (six) hours as needed. (Patient taking differently: Take 25 mg by mouth every 6 (six) hours as needed for itching (rash). ) 30 capsule 2  . HYDROcodone-acetaminophen (NORCO) 5-325 MG tablet Take 1 tablet by mouth 2 (two) times daily as needed. 20 tablet 0  . insulin aspart (NOVOLOG FLEXPEN) 100 UNIT/ML FlexPen Inject 8 units before each meal three times a day (Patient taking differently: Inject 0-5 Units into the skin 3 (three) times daily with meals. Dose per sliding scale) 15 mL 5  . insulin glargine (LANTUS) 100 UNIT/ML Solostar Pen Inject 35 Units into the skin at bedtime. Diagnosis code E11.9. 45 mL 3  . Insulin Pen Needle (PEN NEEDLES) 32G X 4 MM MISC 1 each by Does not apply route 5 (five) times daily. 390 each 1  . Insulin Syringe-Needle U-100 31G X 15/64" 0.3 ML MISC Please use for insulin administration 3 time daily. 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 Insulin dependent 200 each 1  . JARDIANCE 10 MG TABS tablet TAKE 1 TABLET BY MOUTH EVERY DAY 90 tablet 3  . liraglutide (VICTOZA) 18  MG/3ML SOPN Inject 1.2 mg into the skin daily. 9 mL 2  . methocarbamol (ROBAXIN) 500 MG tablet Take 1 tablet (500 mg total) by mouth 2 (two) times daily as needed. 20 tablet 0  . ondansetron (ZOFRAN) 4 MG tablet Take 1 tablet (4 mg total) by mouth every 8 (eight) hours as needed for nausea or vomiting. 40 tablet 0  . oxybutynin (DITROPAN-XL) 10 MG 24 hr tablet TAKE 1 TABLET BY MOUTH DAILY 90 tablet 1  . oxyCODONE-acetaminophen (PERCOCET) 5-325 MG tablet Take 1-2 tablets by mouth every 6 (six) hours as needed. To be taken after surgery 40 tablet 0  . rOPINIRole (REQUIP) 0.5 MG tablet Take 1 tablet (0.5 mg total) by mouth at bedtime. Take 1 TO 3 HOURS BEFORE BEDTIME 30 tablet 2  . sulfamethoxazole-trimethoprim (BACTRIM DS) 800-160 MG tablet Take 1 tablet by mouth 2  (two) times daily. To be taken after surgery 20 tablet 0  . traMADol (ULTRAM) 50 MG tablet Take 1 tablet (50 mg total) by mouth 3 (three) times daily as needed. 60 tablet 0   No facility-administered medications prior to visit.     ROS  Review of Systems  Neurological: Negative for dizziness, light-headedness and headaches.  Psychiatric/Behavioral: Positive for sleep disturbance. Negative for agitation, behavioral problems, decreased concentration, hallucinations and self-injury.    Objective:   BP 125/69 (BP Location: Right Arm, Patient Position: Sitting, Cuff Size: Small)   Pulse 99   Temp 98.3 F (36.8 C) (Oral)   Ht _0  (1.549 m)   Wt 180 lb 1.6 oz (81.7 kg)   LMP 07/04/2017   SpO2 100%   BMI 34.03 kg/m  Wt Readings from Last 3 Encounters:  09/21/20 180 lb 1.6 oz (81.7 kg)  07/01/20 187 lb 4.8 oz (85 kg)  06/06/20 190 lb 11.2 oz (86.5 kg)   BP Readings from Last 3 Encounters:  09/21/20 125/69  07/01/20 127/65  06/07/20 137/79   Physical Exam General: well appearing in NAD MSK: walks with an anelgesic gait with the assistance of a cane on the right.  Psych: normal affect. Normal thought content.  Health Maintenance:   Health Maintenance  Topic Date Due  . COLONOSCOPY (Pts 45-85yr Insurance coverage will need to be confirmed)  Never done  . MAMMOGRAM  12/25/2017  . PAP SMEAR-Modifier  11/29/2018  . TETANUS/TDAP  08/30/2019  . COVID-19 Vaccine (2 - Booster for [facility] [employer]) 11/22/2019  . LIPID PANEL  04/01/2020  . URINE MICROALBUMIN  04/01/2020  . HEMOGLOBIN A1C  12/22/2020  . FOOT EXAM  05/02/2021  . OPHTHALMOLOGY EXAM  08/31/2021  . INFLUENZA VACCINE  Completed  . PNEUMOCOCCAL POLYSACCHARIDE VACCINE AGE 62-64 HIGH RISK  Completed  . Hepatitis C Screening  Completed  . HIV Screening  Completed  . HPV VACCINES  Aged Out     Assessment & Plan:   Problem List Items Addressed This Visit      Endocrine   Type 2 diabetes mellitus with retinopathy of  both eyes, with long-term current use of insulin ([place]    Current medications: victoza 1.237mdaily, jardiance 1021maily, lantus 35U daily, novolog mealtime SSI Pt notes that she has not been using her novolog recently. She is inconsistent with lantus use as well. Fasting glucoses are typically in the 80s-90s. She occasionally checks her blood sugars and infrequently experiences hypoglycemia with symptoms. A1C is down 7.9>7.6 Foot exam performed in the last 12 months. Next due in October Retinal exam is due.  Plan -DISCONTINUE lantus and novolog -INCREASE Jardiance to 20m -continue victoza 1.211m-she is aware of need for retinal exam -urine microalbumin/creatinine collected today to screen for proteinuria -she is aware to call [place] her blood sugars become very elevated with stopping lantus -f/u in 3 months      Relevant Medications   aspirin EC 81 MG tablet   empagliflozin (JARDIANCE) 25 MG TABS tablet   liraglutide (VICTOZA) 18 MG/3ML SOPN   Other Relevant Orders   POC Hbg A1C (Completed)   Microalbumin / Creatinine Urine Ratio   Lipid panel     Other   Hyperlipidemia (Chronic)    Current medications: lipitor 2015mill obtain a lipid panel today      Relevant Medications   aspirin EC 81 MG tablet   Long-term current use of opiate analgesic - Primary (Chronic)    Current mediations: oxycodone 25m69mh prn, flexeril 25mg40m prn, mobic 7.5mg  70mly, requip 0.5mg QH78mndication: chronic severe osteoarthritis and fibromyalgia Denies significant side effects including sedation and constipation. Medication not interfering with ADLs Last UDS was in 2020 and appropriate PDMP reviewed and appropriate Opioid contract signed at today's visit. She has also been receiving percocet from orthopedics. Discussed that all controlled substances need to come through our office from now on.  Discussed alternative pharmacological and non-pharmacological pain relief therapies I counseled the  patient on the danger of opioid therapy and that medications should never be taken except as prescribed. We discussed how extreme caution is to be used whenever they are taking their opioid medications, especially as it relates to driving or operating machinery. We discussed that alcohol use with opioid therapy concomitantly is extremely dangerous and prohibited. Patient was told about the risk of respiratory depression, especially with concomitant use of opioid with benzodiazepines and was advised to tell his/her other providers that he/she is receiving opioids from us. Fin[place], it was stated that misuse of opioids can lead to death.  Plan:  -continue current management. Three oxycodone scripts sent to her pharmacy--#120 -UDS collected at today's visit -bowel management with colace      Relevant Orders   ToxAssure Select,+Antidepr,UR   Urinary incontinence (Chronic)    Managed well on oxybutinin. Continue current management.      Relevant Medications   oxybutynin (DITROPAN-XL) 10 MG 24 hr tablet   Obesity (BMI 30-39.9) (Chronic)    Weight is down ~20# through increased physical activity. Pt congratulated. Discussed how this will contribute to improving glycemic control as well. She is encouraged to continue to work on diet and exercise.      Relevant Medications   empagliflozin (JARDIANCE) 25 MG TABS tablet   liraglutide (VICTOZA) 18 MG/3ML SOPN   Restless legs syndrome (RLS)    Symptoms have been a little worse lately. No recent changes to requip or flexeril doses.  -will check an iron panel to r/o iron deficiency      Relevant Medications   rOPINIRole (REQUIP) 0.5 MG tablet    Other Visit Diagnoses    RLS (restless legs syndrome)       Relevant Orders   Iron, TIBC and Ferritin Panel   Colon cancer screening       Relevant Orders   Ambulatory referral to Gastroenterology   Type 2 diabetes mellitus without complication, with long-term current use of insulin (HCC)        Relevant Medications   aspirin EC 81 MG tablet   empagliflozin (JARDIANCE) 25 MG TABS tablet  liraglutide (VICTOZA) 18 MG/3ML SOPN   Urge incontinence       Relevant Medications   oxybutynin (DITROPAN-XL) 10 MG 24 hr tablet        Pt discussed with Dr. Adolm Joseph, MD Internal Medicine Resident PGY-2 [facility] Pager: [phone] 09/21/2020 5:32 PM

## 2020-09-21 NOTE — Assessment & Plan Note (Signed)
Weight is down ~20# through increased physical activity. Pt congratulated. Discussed how this will contribute to improving glycemic control as well. She is encouraged to continue to work on diet and exercise.

## 2020-09-21 NOTE — Progress Notes (Signed)
Subjective: Patient is here for ultrasound-guided intra-articular left hip injection.   Pain from DJD.  Objective:  Pain with IR.  Procedure: Ultrasound guided injection is preferred based studies that show increased duration, increased effect, greater accuracy, decreased procedural pain, increased response rate, and decreased cost with ultrasound guided versus blind injection.   Verbal informed consent obtained.  Time-out conducted.  Noted no overlying erythema, induration, or other signs of local infection. Ultrasound-guided left hip injection: After sterile prep with Betadine, injected 4 cc 0.25% bupivacaine without epinephrine and 6 mg betamethasone using a 22-gauge spinal needle, passing the needle through the iliofemoral ligament into the femoral head/neck junction.  Injectate seen filling joint capsule.  Good immediate relief.

## 2020-09-21 NOTE — Assessment & Plan Note (Signed)
Managed well on oxybutinin. Continue current management.

## 2020-09-21 NOTE — Patient Instructions (Signed)
Your A1C is 7.6 today which is good! I think we can make it even better and work towards getting you off insulin eventually. I would like you to increase your jardiance to 25mg .   Please come back and see me in 3 months.

## 2020-09-22 LAB — IRON,TIBC AND FERRITIN PANEL
Ferritin: 69 ng/mL (ref 15–150)
Iron Saturation: 17 % (ref 15–55)
Iron: 71 ug/dL (ref 27–159)
Total Iron Binding Capacity: 417 ug/dL (ref 250–450)
UIBC: 346 ug/dL (ref 131–425)

## 2020-09-22 LAB — LIPID PANEL
Chol/HDL Ratio: 2.5 ratio (ref 0.0–4.4)
Cholesterol, Total: 173 mg/dL (ref 100–199)
HDL: 69 mg/dL (ref >39)
LDL Chol Calc (NIH): 87 mg/dL (ref 0–99)
Triglycerides: 95 mg/dL (ref 0–149)
VLDL Cholesterol Cal: 17 mg/dL (ref 5–40)

## 2020-09-22 LAB — MICROALBUMIN / CREATININE URINE RATIO
Creatinine, Urine: 74.6 mg/dL
Microalb/Creat Ratio: 6 mg/g creat (ref 0–29)
Microalbumin, Urine: 4.6 ug/mL

## 2020-09-26 NOTE — Progress Notes (Signed)
Internal Medicine Clinic Attending  Case discussed with Dr. Christian  At the time of the visit.  We reviewed the resident's history and exam and pertinent patient test results.  I agree with the assessment, diagnosis, and plan of care documented in the resident's note.  

## 2020-09-30 LAB — TOXASSURE SELECT,+ANTIDEPR,UR

## 2020-10-04 ENCOUNTER — Ambulatory Visit: Payer: Medicaid Other | Admitting: Orthopaedic Surgery

## 2020-10-10 ENCOUNTER — Other Ambulatory Visit: Payer: Self-pay

## 2020-10-10 DIAGNOSIS — E785 Hyperlipidemia, unspecified: Secondary | ICD-10-CM

## 2020-10-10 DIAGNOSIS — R21 Rash and other nonspecific skin eruption: Secondary | ICD-10-CM

## 2020-10-10 MED ORDER — ATORVASTATIN CALCIUM 20 MG PO TABS: 20.0000 mg | ORAL_TABLET | Freq: Every day | ORAL | 1 refills | Status: DC

## 2020-10-10 MED ORDER — CETIRIZINE HCL 10 MG PO TABS
10.0000 mg | ORAL_TABLET | Freq: Every day | ORAL | 3 refills | Status: DC
Start: 2020-10-10 — End: 2020-12-23

## 2020-10-10 NOTE — Telephone Encounter (Signed)
atorvastatin (LIPITOR) 20 MG tablet,   cetirizine (ZYRTEC) 10 MG tablet, REFILL REQUEST @  [id number] [place], [place] - [address]. Phone:  [phone]  Fax:  [phone]     Requesting to speak with a nurse about taking Lantus. Please call back.

## 2020-10-11 ENCOUNTER — Ambulatory Visit (INDEPENDENT_AMBULATORY_CARE_PROVIDER_SITE_OTHER): Payer: Medicaid Other

## 2020-10-11 ENCOUNTER — Encounter: Payer: Self-pay | Admitting: Orthopaedic Surgery

## 2020-10-11 ENCOUNTER — Ambulatory Visit (INDEPENDENT_AMBULATORY_CARE_PROVIDER_SITE_OTHER): Payer: Medicaid Other | Admitting: Orthopaedic Surgery

## 2020-10-11 ENCOUNTER — Other Ambulatory Visit: Payer: Self-pay

## 2020-10-11 VITALS — Ht 61.0 in | Wt 179.8 lb

## 2020-10-11 DIAGNOSIS — Z96652 Presence of left artificial knee joint: Secondary | ICD-10-CM

## 2020-10-11 DIAGNOSIS — M1612 Unilateral primary osteoarthritis, left hip: Secondary | ICD-10-CM

## 2020-10-11 MED ORDER — AMOXICILLIN 500 MG PO CAPS: ORAL_CAPSULE | ORAL | 0 refills | Status: DC

## 2020-10-11 MED ORDER — DICLOFENAC SODIUM 75 MG PO TBEC: 75.0000 mg | DELAYED_RELEASE_TABLET | Freq: Two times a day (BID) | ORAL | 0 refills | Status: DC | PRN

## 2020-10-11 NOTE — Progress Notes (Signed)
Post-Op Visit Note   Patient: [name]           Date of Birth: September 15, 1960           MRN: [id number] Visit Date: 10/11/2020 PCP: Mitzi Hansen, MD   Assessment & Plan:  Chief Complaint:  Chief Complaint  Patient presents with  . Left Knee - Pain  . Left Hip - Pain   Visit Diagnoses:  1. Unilateral primary osteoarthritis, left hip   2. Status post total left knee replacement     Plan: Patient is a pleasant 60 year old female who comes in today 3 months out left total knee replacement.  She has been doing okay.  Her pain has slightly improved since the fall last month.  She still notes pain to the medial lateral aspects, however.  She recently had a cortisone injection to the left hip joint which did help the majority of her knee pain.  Examination of the left knee shows moderate tenderness to the medial and lateral joint lines.  She is stable to valgus and varus stress.  At this point, believe she may have suffered a small fracture to the medial tibial plateau from her fall but this should improve with symptomatic treatment.  She will follow up with [name] in 3 months time for repeat evaluation and 2 view x-rays of left knee.  Dental prophylaxis reinforced.  Follow-Up Instructions: Return in about 3 months (around 01/11/2021).   Orders:  Orders Placed This Encounter  Procedures  . XR KNEE 3 VIEW LEFT  . XR HIP UNILAT W OR W/O PELVIS 2-3 VIEWS LEFT   Meds ordered this encounter  Medications  . diclofenac (VOLTAREN) 75 MG EC tablet    Sig: Take 1 tablet (75 mg total) by mouth 2 (two) times daily as needed.    Dispense:  60 tablet    Refill:  0  . amoxicillin (AMOXIL) 500 MG capsule    Sig: Take 4 pills one hour prior to dental work    Dispense:  12 capsule    Refill:  0    Imaging: XR HIP UNILAT W OR W/O PELVIS 2-3 VIEWS LEFT  Result Date: 10/11/2020 Moderate degenerative changes  XR KNEE 3 VIEW LEFT  Result Date: 10/11/2020 Well-seated prosthesis.  She does  have a small lucency in the medial tibial plateau which is unchanged from prior x-ray.   PMFS History: Patient Active Problem List   Diagnosis Date Noted  . Primary osteoarthritis of left knee 06/05/2020  . Obesity (BMI 30-39.9) 04/02/2019  . Urinary incontinence 01/01/2019  . Left hip pain 12/05/2017  . Chronic midline low back pain with bilateral sciatica 01/31/2017  . Restless legs syndrome (RLS) 10/24/2016  . Long-term current use of opiate analgesic 03/01/2016  . Insomnia 12/02/2012  . Fibromyalgia 05/23/2012  . Healthcare maintenance 03/01/2011  . Hyperlipidemia 07/27/2010  . Type 2 diabetes mellitus with retinopathy of both eyes, with long-term current use of insulin ([place]) 07/26/2010   Past Medical History:  Diagnosis Date  . 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 Arthritis   . Diabetes mellitus    oral tx  . Fibromyalgia   . GERD (gastroesophageal reflux disease)   . History of post-sterilization tuboplasty 2000  . Plantar fasciitis    Right  . Shortness of breath    with exertion  . Sleep apnea 5 plus yrs   study -pt could not sleep test inconclusive.   Marland Kitchen  Tear of meniscus of left knee    x2  . Tear of meniscus of right knee     Family History  Problem Relation Age of Onset  . Breast cancer Sister        both sisters have breast cancer    Past Surgical History:  Procedure Laterality Date  . CARPAL TUNNEL RELEASE Bilateral   . CHOLECYSTECTOMY    . FOOT TENDON SURGERY    . KNEE ARTHROSCOPY  10/10/2011   Procedure: ARTHROSCOPY KNEE;  Surgeon: Newt Minion, MD;  Location: [facility];  Service: Orthopedics;  Laterality: Right;  Right Knee Arthroscopy and Excision Medial Meniscus  . MENISCUS REPAIR     R and L (L x2)  . ROTATOR CUFF REPAIR Left   . TONSILLECTOMY    . TOTAL KNEE ARTHROPLASTY Left 06/06/2020   Procedure: LEFT TOTAL KNEE ARTHROPLASTY;  Surgeon: Leandrew Koyanagi, MD;  Location: [facility];  Service: Orthopedics;   Laterality: Left;  . TUBAL LIGATION     Social History   Occupational History  . Occupation: unemployed    Fish farm manager: UNEMPLOYED  Tobacco Use  . Smoking status: Never Smoker  . Smokeless tobacco: Never Used  Vaping Use  . Vaping Use: Never used  Substance and Sexual Activity  . Alcohol use: No    Alcohol/week: 0.0 standard drinks  . Drug use: No  . Sexual activity: Not on file

## 2020-10-12 ENCOUNTER — Ambulatory Visit: Payer: Medicaid Other | Admitting: Orthopaedic Surgery

## 2020-10-18 ENCOUNTER — Ambulatory Visit: Payer: Medicaid Other | Admitting: Orthopaedic Surgery

## 2020-10-20 ENCOUNTER — Other Ambulatory Visit: Payer: Self-pay | Admitting: Student

## 2020-10-24 ENCOUNTER — Other Ambulatory Visit: Payer: Self-pay | Admitting: Student

## 2020-10-24 NOTE — Addendum Note (Signed)
Addended by: Iona Beard on: 10/24/2020 02:25 PM   Modules accepted: Orders

## 2020-11-14 ENCOUNTER — Telehealth: Payer: Self-pay

## 2020-11-14 NOTE — Telephone Encounter (Signed)
Return pt's call who stated her husband and her had been out of the country; upon returning, both tested positive for COVID. First, they did a home test then went to [facility] which both were positive. She c/o chills, fever x 1 day, and body aches. Requesting medication - informed they will need a telehealth appt. Call transferred to front desk - appt 5/3 with Dr Bridgett Larsson.

## 2020-11-14 NOTE — Telephone Encounter (Signed)
Pt and husband tested positive for COVID; pt wanted some medicine [phone]

## 2020-11-14 NOTE — Telephone Encounter (Signed)
Seen, agree with plan. 

## 2020-11-15 ENCOUNTER — Other Ambulatory Visit: Payer: Self-pay

## 2020-11-15 ENCOUNTER — Ambulatory Visit (INDEPENDENT_AMBULATORY_CARE_PROVIDER_SITE_OTHER): Payer: Medicaid Other | Admitting: Student

## 2020-11-15 DIAGNOSIS — U071 COVID-19: Secondary | ICD-10-CM | POA: Insufficient documentation

## 2020-11-15 NOTE — Progress Notes (Signed)
  [facility] Telephone Encounter Continuity Care Appointment  HPI:   This telephone encounter was created for Ms. Jaline Pincock on 11/15/2020 for the following purpose/cc COVID infection.   Past Medical History:  Past Medical History:  Diagnosis Date  . 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 Arthritis   . Diabetes mellitus    oral tx  . Fibromyalgia   . GERD (gastroesophageal reflux disease)   . History of post-sterilization tuboplasty 2000  . Plantar fasciitis    Right  . Shortness of breath    with exertion  . Sleep apnea 5 plus yrs   study -pt could not sleep test inconclusive.   . Tear of meniscus of left knee    x2  . Tear of meniscus of right knee       ROS:   Positive for dysgeusia, fever, body aches  Negative for cough, shortness of breath   Assessment / Plan / Recommendations:   Please see A&P under problem oriented charting for assessment of the patient's acute and chronic medical conditions.   As always, pt is advised that if symptoms worsen or new symptoms arise, they should go to an urgent care facility or to to ER for further evaluation.   Consent and Medical Decision Making:   Patient discussed with Dr. Jimmye Norman  This is a telephone encounter between [facility] and Andrew Au on 11/15/2020 for COVID infection. The visit was conducted with the patient located at home and Andrew Au at [facility]. The patient's identity was confirmed using their DOB and current address. The patient has consented to being evaluated through a telephone encounter and understands the associated risks (an examination cannot be done and the patient may need to come in for an appointment) / benefits (allows the patient to remain at home, decreasing exposure to coronavirus). I personally spent 10 minutes on medical discussion.

## 2020-11-15 NOTE — Assessment & Plan Note (Signed)
Patient had symptoms of body aches, fever, dysgeusia with onset on 4/28.  Recently traveled from outside the country.  Tested positive for COVID at home, then after positive testing again at [facility].  Her symptoms have largely resolved, though they have remained stable dysgeusia.  Denies any cough or shortness of breath.   -Outside of window for Paxlovid - Symptoms much improved, continue supportive care

## 2020-11-17 ENCOUNTER — Telehealth: Payer: Self-pay

## 2020-11-17 NOTE — Telephone Encounter (Signed)
Questions about empagliflozin (JARDIANCE) 25 MG TABS tablet, please call pt back.

## 2020-11-17 NOTE — Telephone Encounter (Signed)
Called pt -stated she just looked at her bottle; she has been taking 10 mg of Jardiance.So when she opened the new bottle, she realized the pills looked different and they are 25 mg. She wanted to know when it was increased;; informed 09/21/20 at her ov; stated she did not know. I asked about her BS; stated they are doing good. Stated she does not want her BS to drop too low. She wanted to know why it was increased; informed per Dr Chase Picket note "Your A1C is 7.6 today which is good! I think we can make it even better and work towards getting you off insulin eventually. I would like you to increase your jardiance to 25mg ." Stated she will take the 25 mg and monitor her BS's.

## 2020-11-17 NOTE — Telephone Encounter (Signed)
Agree with plan 

## 2020-11-22 NOTE — Progress Notes (Signed)
Internal Medicine Clinic Attending  Case discussed with Dr. Chen  At the time of the visit.  We reviewed the resident's history and exam and pertinent patient test results.  I agree with the assessment, diagnosis, and plan of care documented in the resident's note. 

## 2020-11-25 ENCOUNTER — Telehealth: Payer: Self-pay

## 2020-11-25 DIAGNOSIS — Z794 Long term (current) use of insulin: Secondary | ICD-10-CM

## 2020-11-25 MED ORDER — EMPAGLIFLOZIN 10 MG PO TABS: 10.0000 mg | ORAL_TABLET | Freq: Every day | ORAL | 3 refills | Status: DC

## 2020-11-25 NOTE — Assessment & Plan Note (Signed)
Received phone call the patient unable to tolerate 25 mg Jardiance.  She is gone back to the 10 mg dose requesting refill on this.  Last A1c was 7.6.  - Decreased Jardiance dose back to 10 mg

## 2020-11-25 NOTE — Telephone Encounter (Signed)
Returned call to patient. States she was unable to tolerate Jardiance 25 mg. States she was "sick for days" with diarrhea, stomach cramps, inability to eat. Has gone back to taking Jardiance 10 mg. [facility] [address]

## 2020-11-25 NOTE — Telephone Encounter (Signed)
Pls contact pt regarding medicine problems [phone]

## 2020-11-25 NOTE — Telephone Encounter (Signed)
Agree, I will refill her Jardiance 10mg 

## 2020-12-16 ENCOUNTER — Ambulatory Visit: Payer: Self-pay

## 2020-12-16 ENCOUNTER — Telehealth: Payer: Self-pay | Admitting: Orthopaedic Surgery

## 2020-12-16 ENCOUNTER — Other Ambulatory Visit: Payer: Self-pay

## 2020-12-16 ENCOUNTER — Ambulatory Visit: Payer: Medicaid Other | Admitting: Family Medicine

## 2020-12-16 ENCOUNTER — Encounter: Payer: Self-pay | Admitting: Family Medicine

## 2020-12-16 DIAGNOSIS — M1612 Unilateral primary osteoarthritis, left hip: Secondary | ICD-10-CM | POA: Diagnosis not present

## 2020-12-16 NOTE — Telephone Encounter (Signed)
That's up to the MD that is doing the RFA.

## 2020-12-16 NOTE — Progress Notes (Signed)
Office Visit Note   Patient: [name]           Date of Birth: [birth year]           MRN: [id number] Visit Date: 12/16/2020 Requested by: Mitzi Hansen, MD [address] [place],  [place] [address] PCP: Mitzi Hansen, MD  Subjective: Chief Complaint  Patient presents with  . Left Hip - Pain    Started having gradual return of the left hip pain 2 weeks ago - has been hurting worse this week. Last had intra-articular cortisone injection on 09/21/20.     HPI: She is here with recurrent left hip pain.  Intra-articular injection in March helped quite a bit until just recently.  She would like to try 1 more injection, and is contemplating surgery at some point.              ROS:   All other systems were reviewed and are negative.  Objective: Vital Signs: LMP 07/04/2017   Physical Exam:  General:  Alert and oriented, in no acute distress. Pulm:  Breathing unlabored. Psy:  Normal mood, congruent affect.  Left hip: She has moderate pain with passive flexion and internal rotation.  Unable to fully extend her hip.  Imaging: US Guided Needle Placement  Result Date: 12/16/2020 Ultrasound guided injection is preferred based studies that show increased duration, increased effect, greater accuracy, decreased procedural pain, increased response rate, and decreased cost with ultrasound guided versus blind injection.   Verbal informed consent obtained.  Time-out conducted.  Noted no overlying erythema, induration, or other signs of local infection. Ultrasound-guided left hip injection: After sterile prep with Betadine, injected 4 cc 0.25% bupivacaine without epinephrine and 6 mg betamethasone using a 22-gauge spinal needle, passing the needle through the iliofemoral ligament into the femoral head/neck junction.  Injectate seen filling joint capsule.  Good immediate relief.     Assessment & Plan: 1.  Left hip osteoarthritis -Injection today.  Follow-up as  needed.     Procedures: No procedures performed        PMFS History: Patient Active Problem List   Diagnosis Date Noted  . COVID-19 11/15/2020  . Primary osteoarthritis of left knee 06/05/2020  . Obesity (BMI 30-39.9) 04/02/2019  . Urinary incontinence 01/01/2019  . Left hip pain 12/05/2017  . Chronic midline low back pain with bilateral sciatica 01/31/2017  . Restless legs syndrome (RLS) 10/24/2016  . Long-term current use of opiate analgesic 03/01/2016  . Insomnia 12/02/2012  . Fibromyalgia 05/23/2012  . Healthcare maintenance 03/01/2011  . Hyperlipidemia 07/27/2010  . Type 2 diabetes mellitus with retinopathy of both eyes, with long-term current use of insulin ([place]) 07/26/2010   Past Medical History:  Diagnosis Date  . 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 Arthritis   . Diabetes mellitus    oral tx  . Fibromyalgia   . GERD (gastroesophageal reflux disease)   . History of post-sterilization tuboplasty 2000  . Plantar fasciitis    Right  . Shortness of breath    with exertion  . Sleep apnea 5 plus yrs   study -pt could not sleep test inconclusive.   . Tear of meniscus of left knee    x2  . Tear of meniscus of right knee     Family History  Problem Relation Age of Onset  . Breast cancer Sister        both sisters have breast cancer  Past Surgical History:  Procedure Laterality Date  . CARPAL TUNNEL RELEASE Bilateral   . CHOLECYSTECTOMY    . FOOT TENDON SURGERY    . KNEE ARTHROSCOPY  10/10/2011   Procedure: ARTHROSCOPY KNEE;  Surgeon: Newt Minion, MD;  Location: [facility];  Service: Orthopedics;  Laterality: Right;  Right Knee Arthroscopy and Excision Medial Meniscus  . MENISCUS REPAIR     R and L (L x2)  . ROTATOR CUFF REPAIR Left   . TONSILLECTOMY    . TOTAL KNEE ARTHROPLASTY Left 06/06/2020   Procedure: LEFT TOTAL KNEE ARTHROPLASTY;  Surgeon: Leandrew Koyanagi, MD;  Location: [facility];  Service: Orthopedics;   Laterality: Left;  . TUBAL LIGATION     Social History   Occupational History  . Occupation: unemployed    Fish farm manager: UNEMPLOYED  Tobacco Use  . Smoking status: Never Smoker  . Smokeless tobacco: Never Used  Vaping Use  . Vaping Use: Never used  Substance and Sexual Activity  . Alcohol use: No    Alcohol/week: 0.0 standard drinks  . Drug use: No  . Sexual activity: Not on file

## 2020-12-16 NOTE — Telephone Encounter (Signed)
Patient aware.

## 2020-12-16 NOTE — Telephone Encounter (Signed)
Pt called and said they're having a radio frequency appt coming up, she wants to know if she needs antibiotics? CB [phone] said just leave it in the message if she happens to not answer.

## 2020-12-20 ENCOUNTER — Other Ambulatory Visit: Payer: Self-pay | Admitting: Internal Medicine

## 2020-12-20 DIAGNOSIS — N3941 Urge incontinence: Secondary | ICD-10-CM

## 2020-12-20 DIAGNOSIS — G2581 Restless legs syndrome: Secondary | ICD-10-CM

## 2020-12-23 ENCOUNTER — Ambulatory Visit: Payer: Medicaid Other | Admitting: Internal Medicine

## 2020-12-23 ENCOUNTER — Other Ambulatory Visit: Payer: Self-pay

## 2020-12-23 VITALS — BP 128/71 | HR 70 | Temp 98.0°F | Ht 62.0 in | Wt 166.8 lb

## 2020-12-23 DIAGNOSIS — I951 Orthostatic hypotension: Secondary | ICD-10-CM

## 2020-12-23 DIAGNOSIS — R0600 Dyspnea, unspecified: Secondary | ICD-10-CM | POA: Diagnosis not present

## 2020-12-23 DIAGNOSIS — Z1231 Encounter for screening mammogram for malignant neoplasm of breast: Secondary | ICD-10-CM

## 2020-12-23 DIAGNOSIS — E113393 Type 2 diabetes mellitus with moderate nonproliferative diabetic retinopathy without macular edema, bilateral: Secondary | ICD-10-CM | POA: Diagnosis not present

## 2020-12-23 DIAGNOSIS — R0609 Other forms of dyspnea: Secondary | ICD-10-CM

## 2020-12-23 DIAGNOSIS — J302 Other seasonal allergic rhinitis: Secondary | ICD-10-CM | POA: Diagnosis not present

## 2020-12-23 DIAGNOSIS — Z1211 Encounter for screening for malignant neoplasm of colon: Secondary | ICD-10-CM | POA: Diagnosis not present

## 2020-12-23 DIAGNOSIS — G2581 Restless legs syndrome: Secondary | ICD-10-CM | POA: Diagnosis not present

## 2020-12-23 DIAGNOSIS — E785 Hyperlipidemia, unspecified: Secondary | ICD-10-CM

## 2020-12-23 DIAGNOSIS — Z794 Long term (current) use of insulin: Secondary | ICD-10-CM

## 2020-12-23 DIAGNOSIS — R634 Abnormal weight loss: Secondary | ICD-10-CM | POA: Diagnosis not present

## 2020-12-23 DIAGNOSIS — Z79891 Long term (current) use of opiate analgesic: Secondary | ICD-10-CM

## 2020-12-23 DIAGNOSIS — N3941 Urge incontinence: Secondary | ICD-10-CM | POA: Diagnosis not present

## 2020-12-23 DIAGNOSIS — Z0000 Encounter for general adult medical examination without abnormal findings: Secondary | ICD-10-CM

## 2020-12-23 LAB — GLUCOSE, CAPILLARY: Glucose-Capillary: 178 mg/dL — ABNORMAL HIGH (ref 70–99)

## 2020-12-23 LAB — POCT GLYCOSYLATED HEMOGLOBIN (HGB A1C): Hemoglobin A1C: 7.5 % — AB (ref 4.0–5.6)

## 2020-12-23 MED ORDER — CETIRIZINE HCL 10 MG PO TABS: 10.0000 mg | ORAL_TABLET | Freq: Every day | ORAL | 3 refills | Status: DC

## 2020-12-23 MED ORDER — CYCLOBENZAPRINE HCL 10 MG PO TABS: 10.0000 mg | ORAL_TABLET | Freq: Two times a day (BID) | ORAL | 3 refills | Status: DC | PRN

## 2020-12-23 MED ORDER — LANTUS SOLOSTAR 100 UNIT/ML ~~LOC~~ SOPN: 10.0000 [IU] | PEN_INJECTOR | Freq: Every day | SUBCUTANEOUS | 1 refills | Status: DC

## 2020-12-23 MED ORDER — ROPINIROLE HCL 0.5 MG PO TABS: 0.5000 mg | ORAL_TABLET | Freq: Every day | ORAL | 2 refills | Status: DC

## 2020-12-23 MED ORDER — SHINGRIX 50 MCG/0.5ML IM SUSR: 0.5000 mL | Freq: Once | INTRAMUSCULAR | 1 refills | Status: DC

## 2020-12-23 MED ORDER — DICLOFENAC SODIUM 3 % EX GEL: 2.0000 g | Freq: Three times a day (TID) | CUTANEOUS | 4 refills | Status: DC

## 2020-12-23 MED ORDER — PEN NEEDLES 32G X 4 MM MISC: 1.0000 | Freq: Every day | 1 refills | Status: DC

## 2020-12-23 MED ORDER — ATORVASTATIN CALCIUM 20 MG PO TABS: 20.0000 mg | ORAL_TABLET | Freq: Every day | ORAL | 1 refills | Status: DC

## 2020-12-23 MED ORDER — HYDROCODONE-ACETAMINOPHEN 10-325 MG PO TABS: 1.0000 | ORAL_TABLET | Freq: Four times a day (QID) | ORAL | 0 refills | Status: DC | PRN

## 2020-12-23 MED ORDER — SHINGRIX 50 MCG/0.5ML IM SUSR: 0.5000 mL | Freq: Once | INTRAMUSCULAR | 1 refills | Status: AC

## 2020-12-24 LAB — CMP14 + ANION GAP
ALT: 14 IU/L (ref 0–32)
AST: 20 IU/L (ref 0–40)
Albumin/Globulin Ratio: 1.5 (ref 1.2–2.2)
Albumin: 4.4 g/dL (ref 3.8–4.9)
Alkaline Phosphatase: 121 IU/L (ref 44–121)
Anion Gap: 15 mmol/L (ref 10.0–18.0)
BUN/Creatinine Ratio: 11 (ref 9–23)
BUN: 8 mg/dL (ref 6–24)
Bilirubin Total: 0.8 mg/dL (ref 0.0–1.2)
CO2: 23 mmol/L (ref 20–29)
Calcium: 9.8 mg/dL (ref 8.7–10.2)
Chloride: 104 mmol/L (ref 96–106)
Creatinine, Ser: 0.7 mg/dL (ref 0.57–1.00)
Globulin, Total: 2.9 g/dL (ref 1.5–4.5)
Glucose: 135 mg/dL — ABNORMAL HIGH (ref 65–99)
Potassium: 3.9 mmol/L (ref 3.5–5.2)
Sodium: 142 mmol/L (ref 134–144)
Total Protein: 7.3 g/dL (ref 6.0–8.5)
eGFR: 100 mL/min/{1.73_m2} (ref >59)

## 2020-12-24 LAB — CBC WITH DIFFERENTIAL/PLATELET
Basophils Absolute: 0 10*3/uL (ref 0.0–0.2)
Basos: 0 %
EOS (ABSOLUTE): 0.1 10*3/uL (ref 0.0–0.4)
Eos: 1 %
Hematocrit: 45.2 % (ref 34.0–46.6)
Hemoglobin: 15.2 g/dL (ref 11.1–15.9)
Immature Grans (Abs): 0 10*3/uL (ref 0.0–0.1)
Immature Granulocytes: 0 %
Lymphocytes Absolute: 4 10*3/uL — ABNORMAL HIGH (ref 0.7–3.1)
Lymphs: 37 %
MCH: 29.4 pg (ref 26.6–33.0)
MCHC: 33.6 g/dL (ref 31.5–35.7)
MCV: 87 fL (ref 79–97)
Monocytes Absolute: 0.5 10*3/uL (ref 0.1–0.9)
Monocytes: 4 %
Neutrophils Absolute: 6.2 10*3/uL (ref 1.4–7.0)
Neutrophils: 58 %
Platelets: 311 10*3/uL (ref 150–450)
RBC: 5.17 x10E6/uL (ref 3.77–5.28)
RDW: 13.2 % (ref 11.7–15.4)
WBC: 10.8 10*3/uL (ref 3.4–10.8)

## 2020-12-24 LAB — TSH: TSH: 0.788 u[IU]/mL (ref 0.450–4.500)

## 2020-12-25 ENCOUNTER — Encounter: Payer: Self-pay | Admitting: Internal Medicine

## 2020-12-25 DIAGNOSIS — R0600 Dyspnea, unspecified: Secondary | ICD-10-CM | POA: Insufficient documentation

## 2020-12-25 DIAGNOSIS — R634 Abnormal weight loss: Secondary | ICD-10-CM | POA: Insufficient documentation

## 2020-12-25 DIAGNOSIS — I951 Orthostatic hypotension: Secondary | ICD-10-CM | POA: Insufficient documentation

## 2020-12-25 DIAGNOSIS — R0609 Other forms of dyspnea: Secondary | ICD-10-CM | POA: Insufficient documentation

## 2020-12-25 NOTE — Assessment & Plan Note (Signed)
On oxybutinin 10mg  daily. Will have her hold this for the next couple weeks due to being orthostatic at today's visit. If she needs something restarted, would consider trying myrbetric due to less anticholinergic effects.

## 2020-12-25 NOTE — Assessment & Plan Note (Addendum)
Pt is presenting with a 3-5 month history of poor appetite and weight loss. She has also noticed decreased exercise tolerace due to shortness of breath and has been experiencing lightheadedness. She has lost around #36 since last October.  Assessment:  -I considered the victoza affecting her appetite however she has been on this since 2019, so it wouldn't be clear to me why it was causing a significant change now -Malignancy is considered. Despite prior recommendations for age appropriate cancer screening, she has not undergone a colonoscopy, mammogram or cervical cancer screening -Hyperthyroidism was considered. TSH is 0.778 today so I think it would be worth checking a fT4 at time of follow up in a couple of weeks -Her orthostatic vitals were positive at today's visit. Laying 142/73; sitting 145/71; standing 117/65. I'm not sure if this is related to the poor appetite or not or what the cause may be. She drinks 4-5 bottles of water daily, she doesn't appear dry on exam, and her metabolic panel doesn't suggest dehydration. -I suspect that the exertional dyspnea is attributable to the orthostasis. Ambulatory O2 sats are unremarkable. She has never smoked and she denies palpitations or other cardiac symptoms. She is not anemic  Plan -mammogram, referral to GI for colonoscopy ordered today -d/c victoza  -she will follow up in 2 weeks so we can see if her symptoms have improved -check fT4 at time of follow up -pap smear at time of follow up -encouraged increased hydration -d/c oxybutinin due to the orthostasis. If symptoms persist but hypotension improves, then consider switching to mybetric

## 2020-12-25 NOTE — Assessment & Plan Note (Addendum)
Mammogram, colonoscopy ordered today She will return in 2 weeks for pap smear Tetanus and pneumococcal vaccination provided today Shingrix vaccination prescription provided today

## 2020-12-25 NOTE — Progress Notes (Signed)
Office Visit   Patient ID: [name], female    DOB: 1961-04-02, 60 y.o.   MRN: [id number]   PCP: Mitzi Hansen, MD   Subjective:  CC: unintentional weight loss, diabetes follow up  [name] is a 60 y.o. year old female who presents for the above. Please refer to problem based charting for assessment and plan.      ACTIVE MEDICATIONS   Outpatient Medications Prior to Visit  Medication Sig Dispense Refill   meloxicam (MOBIC) 7.5 MG tablet Take 1 tablet (7.5 mg total) by mouth daily as needed for up to 60 doses for pain. 60 tablet 2   ACCU-CHEK FASTCLIX LANCETS MISC Check three times a day to check blood sugar. 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 insulin dependent 306 each 1   aspirin EC 81 MG tablet Take 1 tablet (81 mg total) by mouth daily. To be taken after surgery 180 tablet 0   Blood Glucose Monitoring Suppl (ACCU-CHEK GUIDE ME) w/Device KIT 1 each by Does not apply route 3 (three) times daily. 1 kit 0   empagliflozin (JARDIANCE) 10 MG TABS tablet Take 1 tablet (10 mg total) by mouth daily before breakfast. 90 tablet 3   glucose blood (ACCU-CHEK GUIDE) test strip Check blood sugar 3 times a day 100 each 12   pantoprazole (PROTONIX) 40 MG tablet Take 1 tablet by mouth daily.     amoxicillin (AMOXIL) 500 MG capsule Take 4 pills one hour prior to dental work 12 capsule 0   atorvastatin (LIPITOR) 20 MG tablet Take 1 tablet (20 mg total) by mouth daily. 90 tablet 1   cetirizine (ZYRTEC) 10 MG tablet Take 1 tablet (10 mg total) by mouth daily. 90 tablet 3   diclofenac (VOLTAREN) 75 MG EC tablet Take 1 tablet (75 mg total) by mouth 2 (two) times daily as needed. 60 tablet 0   Diclofenac Sodium 3 % GEL Apply 2 g topically 3 (three) times daily. 100 g 4   docusate sodium (COLACE) 100 MG capsule Take 1 capsule (100 mg total) by mouth daily as needed. 30 capsule 2   liraglutide (VICTOZA) 18 MG/3ML SOPN Inject 1.2 mg into the skin daily. 15 mL 2   meclizine (ANTIVERT) 12.5 MG tablet Take 1  tablet (12.5 mg total) by mouth 3 (three) times daily as needed for dizziness. 30 tablet 0   oxybutynin (DITROPAN-XL) 10 MG 24 hr tablet Take 1 tablet (10 mg total) by mouth daily. 90 tablet 1   rOPINIRole (REQUIP) 0.5 MG tablet Take 1 tablet (0.5 mg total) by mouth at bedtime. Take 1 TO 3 HOURS BEFORE BEDTIME 30 tablet 2   No facility-administered medications prior to visit.     Objective:   BP 128/71 (BP Location: Left Arm, Patient Position: Sitting, Cuff Size: Normal)   Pulse 70   Temp 98 F (36.7 C) (Oral)   Ht '5\' 2"'  (1.575 m)   Wt 166 lb 12.8 oz (75.7 kg)   LMP 07/04/2017   SpO2 98%   BMI 30.51 kg/m  Wt Readings from Last 3 Encounters:  12/23/20 166 lb 12.8 oz (75.7 kg)  10/11/20 179 lb 12.8 oz (81.6 kg)  09/21/20 180 lb 1.6 oz (81.7 kg)   BP Readings from Last 3 Encounters:  12/23/20 128/71  09/21/20 125/69  07/01/20 127/65   Physical exam General: well appearing HEENT: no temporal wasting, no scleral icterus Cardiac: RRR, no LE edema Pulm: lungs clear GI: not distended, not tender Skin: no rash on limited exam  Health Maintenance:   Health Maintenance  Topic Date Due   Pneumococcal Vaccine 37-59 Years old (1 - PCV) Never done   Zoster Vaccines- Shingrix (1 of 2) Never done   COLONOSCOPY (Pts 45-60yr Insurance coverage will need to be confirmed)  Never done   MAMMOGRAM  12/25/2017   PAP SMEAR-Modifier  11/29/2018   COVID-19 Vaccine (2 - [place] risk series) 10/25/2019   INFLUENZA VACCINE  02/13/2021   HEMOGLOBIN A1C  03/25/2021   FOOT EXAM  05/02/2021   OPHTHALMOLOGY EXAM  08/31/2021   LIPID PANEL  09/21/2021   URINE MICROALBUMIN  09/21/2021   TETANUS/TDAP  12/24/2030   PNEUMOCOCCAL POLYSACCHARIDE VACCINE AGE 11-64 HIGH RISK  Completed   Hepatitis C Screening  Completed   HIV Screening  Completed   HPV VACCINES  Aged Out     Assessment & Plan:   Problem List Items Addressed This Visit       Cardiovascular and Mediastinum   Orthostatic  hypotension   Relevant Medications   atorvastatin (LIPITOR) 20 MG tablet     Endocrine   DM2 (diabetes mellitus, type 2) (HCC) - Primary     DIABETES TYPE 2 FOLLOW-UP: Anti-hyperglycemic agents: victoza 1.240mdaily, jardiance 1033maily Secondary agents: lipitor 73m9mily Last A1C: 7.6 A1C today 7.5  Med Adherence:  '[x]'  Yes    '[]'  No Medication side effects:  '[]'  Yes    '[x]'  No  Hypoglycemic episodes?: no Numbness of the feet? '[]'  Yes    '[x]'  No Retinopathy hx? '[x]'  Yes    '[x]'  No Last eye exam: 08/2020 Last foot exam negative within the past year.  Assessment: Type II DM without complications and well controlled Plan -due to recent weight loss and appetite issues, victoza discontinued at today's visit -continue jardiance -will restart her on lantus 10U daily -f/u 3 months with A1C -foot exam due in October 2022 -eye exam due February 2023        Relevant Medications   atorvastatin (LIPITOR) 20 MG tablet   insulin glargine (LANTUS SOLOSTAR) 100 UNIT/ML Solostar Pen   Insulin Pen Needle (PEN NEEDLES) 32G X 4 MM MISC   Other Relevant Orders   POC Hbg A1C (Completed)   Glucose, capillary (Completed)     Other   Hyperlipidemia (Chronic)   Relevant Medications   atorvastatin (LIPITOR) 20 MG tablet   Healthcare maintenance (Chronic)    Mammogram, colonoscopy ordered today She will return in 2 weeks for pap smear Tetanus and pneumococcal vaccination provided today Shingrix vaccination prescription provided today       Long-term current use of opiate analgesic (Chronic)    Current mediations: oxycodone 10mg31m prn, flexeril Indication: osteoarthritis, fibromyalgia She does note feeling as though the current oxycodone dose is working stronger since her weight loss. Medication not interfering with ADLs Last UDS 09/2020 appropriate PDMP reviewed and appropriate Pain contract in place: yes  Plan:  -we will try switching her to norco 10-325 to see if she can get enough relief  from this. #120/month -continue flexeril -f/u 3 months       Relevant Medications   HYDROcodone-acetaminophen (NORCO) 10-325 MG tablet   Restless legs syndrome (RLS) (Chronic)   Relevant Medications   rOPINIRole (REQUIP) 0.5 MG tablet   cyclobenzaprine (FLEXERIL) 10 MG tablet   Urinary incontinence (Chronic)    On oxybutinin 10mg 51my. Will have her hold this for the next couple weeks due to being orthostatic at today's visit. If she needs something restarted, would consider  trying myrbetric due to less anticholinergic effects.       Unintentional weight loss    Pt is presenting with a 3-5 month history of poor appetite and weight loss. She has also noticed decreased exercise tolerace due to shortness of breath and has been experiencing lightheadedness. She has lost around #36 since last October.  Assessment:  -I considered the victoza affecting her appetite however she has been on this since 2019, so it wouldn't be clear to me why it was causing a significant change now -Malignancy is considered. Despite prior recommendations for age appropriate cancer screening, she has not undergone a colonoscopy, mammogram or cervical cancer screening -Hyperthyroidism was considered. TSH is 0.778 today so I think it would be worth checking a fT4 at time of follow up in a couple of weeks -Her orthostatic vitals were positive at today's visit. Laying 142/73; sitting 145/71; standing 117/65. I'm not sure if this is related to the poor appetite or not or what the cause may be. She drinks 4-5 bottles of water daily, she doesn't appear dry on exam, and her metabolic panel doesn't suggest dehydration. -I suspect that the exertional dyspnea is attributable to the orthostasis. Ambulatory O2 sats are unremarkable. She has never smoked and she denies palpitations or other cardiac symptoms. She is not anemic  Plan -mammogram, referral to GI for colonoscopy ordered today -d/c victoza  -she will follow up in 2  weeks so we can see if her symptoms have improved -check fT4 at time of follow up -pap smear at time of follow up -encouraged increased hydration -d/c oxybutinin due to the orthostasis. If symptoms persist but hypotension improves, then consider switching to mybetric         Relevant Orders   CBC with Diff (Completed)   CMP14 + Anion Gap (Completed)   TSH (Completed)   Exertional dyspnea   Other Visit Diagnoses     Rash and nonspecific skin eruption       Encounter for screening mammogram for malignant neoplasm of breast       Relevant Orders   MM Digital Screening   Screening for colon cancer       Relevant Orders   Ambulatory referral to Gastroenterology   Seasonal allergies       Relevant Medications   cetirizine (ZYRTEC) 10 MG tablet        Return in about 2 weeks (around 01/06/2021).   Pt discussed with Dr. Marty Heck, MD Internal Medicine Resident PGY-2 [facility] Pager: [phone] 12/25/2020 1:50 PM

## 2020-12-25 NOTE — Assessment & Plan Note (Addendum)
DIABETES TYPE 2 FOLLOW-UP: Anti-hyperglycemic agents: victoza 1.2mg  daily, jardiance 10mg  daily Secondary agents: lipitor 20mg  daily Last A1C: 7.6 A1C today 7.5  Med Adherence:  [x]  Yes    []  No Medication side effects:  []  Yes    [x]  No  Hypoglycemic episodes?: no Numbness of the feet? []  Yes    [x]  No Retinopathy hx? [x]  Yes    [x]  No Last eye exam: 08/2020 Last foot exam negative within the past year.  Assessment: Type II DM without complications and well controlled Plan -due to recent weight loss and appetite issues, victoza discontinued at today's visit -continue jardiance -will restart her on lantus 10U daily -f/u 3 months with A1C -foot exam due in October 2022 -eye exam due February 2023

## 2020-12-25 NOTE — Assessment & Plan Note (Addendum)
Current mediations: oxycodone 10mg  q4h prn, flexeril Indication: osteoarthritis, fibromyalgia She does note feeling as though the current oxycodone dose is working stronger since her weight loss. Medication not interfering with ADLs Last UDS 09/2020 appropriate PDMP reviewed and appropriate Pain contract in place: yes  Plan:  -we will try switching her to norco 10-325 to see if she can get enough relief from this. #120/month -continue flexeril -f/u 3 months

## 2020-12-27 ENCOUNTER — Other Ambulatory Visit: Payer: Self-pay | Admitting: Student

## 2020-12-27 NOTE — Progress Notes (Signed)
Internal Medicine Clinic Attending  Case discussed with Dr. Christian  At the time of the visit.  We reviewed the resident's history and exam and pertinent patient test results.  I agree with the assessment, diagnosis, and plan of care documented in the resident's note.  

## 2021-01-11 ENCOUNTER — Encounter: Payer: Self-pay | Admitting: Orthopaedic Surgery

## 2021-01-11 ENCOUNTER — Other Ambulatory Visit: Payer: Self-pay

## 2021-01-11 ENCOUNTER — Ambulatory Visit (INDEPENDENT_AMBULATORY_CARE_PROVIDER_SITE_OTHER): Payer: Medicaid Other

## 2021-01-11 ENCOUNTER — Ambulatory Visit (INDEPENDENT_AMBULATORY_CARE_PROVIDER_SITE_OTHER): Payer: Medicaid Other | Admitting: Orthopaedic Surgery

## 2021-01-11 DIAGNOSIS — M7062 Trochanteric bursitis, left hip: Secondary | ICD-10-CM

## 2021-01-11 DIAGNOSIS — Z96652 Presence of left artificial knee joint: Secondary | ICD-10-CM

## 2021-01-11 DIAGNOSIS — M25552 Pain in left hip: Secondary | ICD-10-CM

## 2021-01-11 MED ORDER — LIDOCAINE HCL 1 % IJ SOLN
3.0000 mL | INTRAMUSCULAR | Status: AC | PRN
  Administered 2021-01-11: 3 mL

## 2021-01-11 MED ORDER — BUPIVACAINE HCL 0.25 % IJ SOLN
2.0000 mL | INTRAMUSCULAR | Status: AC | PRN
  Administered 2021-01-11: 2 mL via INTRA_ARTICULAR

## 2021-01-11 MED ORDER — METHYLPREDNISOLONE ACETATE 40 MG/ML IJ SUSP
40.0000 mg | INTRAMUSCULAR | Status: AC | PRN
  Administered 2021-01-11: 40 mg via INTRA_ARTICULAR

## 2021-01-11 NOTE — Progress Notes (Signed)
Office Visit Note   Patient: [name]           Date of Birth: 04-Apr-1961           MRN: [id number] Visit Date: 01/11/2021              Requested by: Mitzi Hansen, MD [address] [place],   [id number] PCP: Mitzi Hansen, MD   Assessment & Plan: Visit Diagnoses:  1. Hx of total knee replacement, left   2. Left hip pain   3. Trochanteric bursitis, left hip     Plan: Impression is 7 months status post left total knee replacement, #2 left hip trochanteric bursitis.  In regards to the knee, she is doing well.  We have discussed trying a course of physical therapy to work on strengthening exercises which she is agreeable to.  Internal referral has been made.  Dental prophylaxis reinforced.  She will follow-up with [name] in 5 months when she is a year out from surgery.  She will call with concerns or questions in meantime.  In regards to her left hip, I believe she is previously getting pain not only from the hip joint but from the trochanteric bursa.  Her knee pain and some pain to the actual hip was alleviated following the intra-articular cortisone injection, but she is still exhibiting point tenderness to the trochanteric bursa.  We will inject this today to see how she does.  Iliotibial band stretches were also provided.  She will call with any concerns or questions in the meantime.  Follow-Up Instructions: Return in about 5 months (around 06/13/2021).   Orders:  Orders Placed This Encounter  Procedures   Large Joint Inj   XR Knee 1-2 Views Left   Ambulatory referral to Physical Therapy   No orders of the defined types were placed in this encounter.     Procedures: Large Joint Inj: L greater trochanter on 01/11/2021 11:39 AM Indications: pain Details: 22 G needle, lateral approach Medications: 3 mL lidocaine 1 %; 2 mL bupivacaine 0.25 %; 40 mg methylPREDNISolone acetate 40 MG/ML     Clinical Data: No additional  findings.   Subjective: Chief Complaint  Patient presents with   Left Hip - Pain   Left Knee - Pain    HPI patient is a pleasant 60 year old female who comes in today approximately 7 months out left total knee replacement 06/06/2020.  She has been doing well.  She still has some discomfort and lack of strength but never went to formal physical therapy.  She has been getting left hip pain for a while and recently underwent left hip intra-articular cortisone injection on 12/16/2020 by Dr. Junius Roads.  She notes that this did somewhat help the knee pain.  She is now complaining of left lateral hip pain.  No previous injection to the trochanteric bursa.  Review of Systems as detailed in HPI.  All others reviewed and are negative.   Objective: Vital Signs: LMP 07/04/2017   Physical Exam well-developed well-nourished female in no acute distress.  Alert and oriented x3.  Ortho Exam left knee exam shows a fully healed surgical scar without evidence of infection or cellulitis.  Range of motion from 0 to 120 degrees.  Stable valgus varus stress.  4 out of 5 strength with resisted straight leg raise.  Left hip exam shows negative logroll negative FADIR.  Marked tenderness to the greater trochanter.  She is neurovascular intact distally.  Specialty Comments:  No specialty  comments available.  Imaging: No results found.   PMFS History: Patient Active Problem List   Diagnosis Date Noted   Unintentional weight loss 12/25/2020   Orthostatic hypotension 12/25/2020   Exertional dyspnea 12/25/2020   Primary osteoarthritis of left knee 06/05/2020   Obesity (BMI 30-39.9) 04/02/2019   Urinary incontinence 01/01/2019   Left hip pain 12/05/2017   Chronic midline low back pain with bilateral sciatica 01/31/2017   Restless legs syndrome (RLS) 10/24/2016   Long-term current use of opiate analgesic 03/01/2016   Insomnia 12/02/2012   Fibromyalgia 05/23/2012   Healthcare maintenance 03/01/2011    Hyperlipidemia 07/27/2010   DM2 (diabetes mellitus, type 2) ([place]) 07/26/2010   Past Medical History:  Diagnosis Date   Adhesive capsulitis of right shoulder    with underlying tendinopathy rotator cuff   Arthritis    Diabetes mellitus    oral tx   Fibromyalgia    GERD (gastroesophageal reflux disease)    History of post-sterilization tuboplasty 2000   Plantar fasciitis    Right   Shortness of breath    with exertion   Sleep apnea 5 plus yrs   study -pt could not sleep test inconclusive.    Tear of meniscus of left knee    x2   Tear of meniscus of right knee     Family History  Problem Relation Age of Onset   Breast cancer Sister        both sisters have breast cancer    Past Surgical History:  Procedure Laterality Date   CARPAL TUNNEL RELEASE Bilateral    CHOLECYSTECTOMY     FOOT TENDON SURGERY     KNEE ARTHROSCOPY  10/10/2011   Procedure: ARTHROSCOPY KNEE;  Surgeon: Newt Minion, MD;  Location: [facility];  Service: Orthopedics;  Laterality: Right;  Right Knee Arthroscopy and Excision Medial Meniscus   MENISCUS REPAIR     R and L (L x2)   ROTATOR CUFF REPAIR Left    TONSILLECTOMY     TOTAL KNEE ARTHROPLASTY Left 06/06/2020   Procedure: LEFT TOTAL KNEE ARTHROPLASTY;  Surgeon: Leandrew Koyanagi, MD;  Location: [facility];  Service: Orthopedics;  Laterality: Left;   TUBAL LIGATION     Social History   Occupational History   Occupation: unemployed    Fish farm manager: UNEMPLOYED  Tobacco Use   Smoking status: Never   Smokeless tobacco: Never  Vaping Use   Vaping Use: Never used  Substance and Sexual Activity   Alcohol use: No    Alcohol/week: 0.0 standard drinks   Drug use: No   Sexual activity: Not on file

## 2021-01-12 ENCOUNTER — Encounter: Payer: Self-pay | Admitting: Internal Medicine

## 2021-01-12 ENCOUNTER — Ambulatory Visit: Payer: Medicaid Other | Admitting: Internal Medicine

## 2021-01-12 ENCOUNTER — Other Ambulatory Visit: Payer: Self-pay

## 2021-01-12 VITALS — BP 123/64 | HR 60 | Temp 98.1°F | Ht 62.0 in | Wt 170.9 lb

## 2021-01-12 DIAGNOSIS — N3941 Urge incontinence: Secondary | ICD-10-CM

## 2021-01-12 DIAGNOSIS — E113393 Type 2 diabetes mellitus with moderate nonproliferative diabetic retinopathy without macular edema, bilateral: Secondary | ICD-10-CM

## 2021-01-12 DIAGNOSIS — Z794 Long term (current) use of insulin: Secondary | ICD-10-CM | POA: Diagnosis not present

## 2021-01-12 DIAGNOSIS — Z0000 Encounter for general adult medical examination without abnormal findings: Secondary | ICD-10-CM

## 2021-01-12 DIAGNOSIS — R634 Abnormal weight loss: Secondary | ICD-10-CM | POA: Diagnosis not present

## 2021-01-12 DIAGNOSIS — Z79891 Long term (current) use of opiate analgesic: Secondary | ICD-10-CM | POA: Diagnosis not present

## 2021-01-12 DIAGNOSIS — N3946 Mixed incontinence: Secondary | ICD-10-CM | POA: Diagnosis not present

## 2021-01-12 MED ORDER — LANTUS SOLOSTAR 100 UNIT/ML ~~LOC~~ SOPN
30.0000 [IU] | PEN_INJECTOR | Freq: Every day | SUBCUTANEOUS | 1 refills | Status: DC
Start: 2021-01-12 — End: 2021-04-11

## 2021-01-12 NOTE — Assessment & Plan Note (Addendum)
We had switched to [place] a couple of weeks ago at her request however she is feeling that it is not lasting as long and would like to go back to oxycodone for her next prescription.  -will send in for 3 months of the oxycodone 10mg  as she was previously on

## 2021-01-15 NOTE — Assessment & Plan Note (Signed)
GI appt for colonoscopy scheduled for 7/12.

## 2021-01-15 NOTE — Assessment & Plan Note (Signed)
She notes that she has been titrating her lantus up to compensate for discontinuing victoza. Currently taking lantus 25U with fasting morning glucoses of around 150-170. No hypoglycemic episodes. She has good insight into her glycemic management.

## 2021-01-15 NOTE — Assessment & Plan Note (Addendum)
Since stopping oxybutynin due to orthostasis at her last office visit, she is reporting improvement in orthostatic symptoms however the urinary incontinence is largely affecting her life.  I considered trying myrbetric however this is not covered by her insurance. Since she is still experiencing occassional orthostatic symptoms, I do not think we should restart the oxybutynin.  She notes that several people in her family have similar issues that have improved through surgery. I think that might be a reasonable idea.  Will refer to urology for urodynamic testing and assistance with ongoing management. Appreciate their recommendations.

## 2021-01-15 NOTE — Progress Notes (Signed)
Office Visit   Patient ID: [name], female    DOB: 1961-03-09, 60 y.o.   MRN: [id number]   PCP: Mitzi Hansen, MD   Subjective:  [name] is a 60 y.o. year old female who presents for follow up on weight loss, exertional dyspnea, and orthostatic symptoms. Please refer to problem based charting for assessment and plan.   ACTIVE MEDICATIONS   Outpatient Medications Prior to Visit  Medication Sig   meloxicam (MOBIC) 7.5 MG tablet Take 1 tablet (7.5 mg total) by mouth daily as needed for up to 60 doses for pain.   ACCU-CHEK FASTCLIX LANCETS MISC Check three times a day to check blood sugar. 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 insulin dependent   aspirin EC 81 MG tablet Take 1 tablet (81 mg total) by mouth daily. To be taken after surgery   atorvastatin (LIPITOR) 20 MG tablet Take 1 tablet (20 mg total) by mouth daily.   Blood Glucose Monitoring Suppl (ACCU-CHEK GUIDE ME) w/Device KIT 1 each by Does not apply route 3 (three) times daily.   cetirizine (ZYRTEC) 10 MG tablet Take 1 tablet (10 mg total) by mouth daily.   cyclobenzaprine (FLEXERIL) 10 MG tablet Take 1 tablet (10 mg total) by mouth 2 (two) times daily as needed for muscle spasms.   Diclofenac Sodium 3 % GEL Apply 2 g topically 3 (three) times daily.   empagliflozin (JARDIANCE) 10 MG TABS tablet Take 1 tablet (10 mg total) by mouth daily before breakfast.   glucose blood (ACCU-CHEK GUIDE) test strip Check blood sugar 3 times a day   HYDROcodone-acetaminophen (NORCO) 10-325 MG tablet Take 1 tablet by mouth every 6 (six) hours as needed.   Insulin Pen Needle (PEN NEEDLES) 32G X 4 MM MISC 1 each by Does not apply route 5 (five) times daily.   pantoprazole (PROTONIX) 40 MG tablet Take 1 tablet by mouth daily.   rOPINIRole (REQUIP) 0.5 MG tablet Take 1 tablet (0.5 mg total) by mouth at bedtime. Take 1 TO 3 HOURS BEFORE BEDTIME   [DISCONTINUED] insulin glargine (LANTUS SOLOSTAR) 100 UNIT/ML Solostar Pen Inject 10 Units into the skin  daily.   No facility-administered medications prior to visit.     Objective:   BP 123/64 (BP Location: Left Arm, Patient Position: Sitting, Cuff Size: Normal)   Pulse 60   Temp 98.1 F (36.7 C) (Oral)   Ht '5\' 2"'  (1.575 m)   Wt 170 lb 14.4 oz (77.5 kg)   LMP 07/04/2017   SpO2 99%   BMI 31.26 kg/m  Wt Readings from Last 3 Encounters:  01/12/21 170 lb 14.4 oz (77.5 kg)  12/23/20 166 lb 12.8 oz (75.7 kg)  10/11/20 179 lb 12.8 oz (81.6 kg)    BP Readings from Last 3 Encounters:  01/12/21 123/64  12/23/20 128/71  09/21/20 125/69   Cardiac: RRR Pulm: lungs clear Psych: denies SI  Assessment & Plan:   Problem List Items Addressed This Visit       Endocrine   DM2 (diabetes mellitus, type 2) ([place])    She notes that she has been titrating her lantus up to compensate for discontinuing victoza. Currently taking lantus 25U with fasting morning glucoses of around 150-170. No hypoglycemic episodes. She has good insight into her glycemic management.       Relevant Medications   insulin glargine (LANTUS SOLOSTAR) 100 UNIT/ML Solostar Pen     Other   Healthcare maintenance (Chronic)    GI appt for colonoscopy scheduled for 7/12.  Long-term current use of opiate analgesic (Chronic)    We had switched to Norco a couple of weeks ago at her request however she is feeling that it is not lasting as long and would like to go back to oxycodone for her next prescription.  -will send in for 3 months of the oxycodone 96m as she was previously on       Mixed incontinence urge and stress - Primary    Since stopping oxybutynin due to orthostasis at her last office visit, she is reporting improvement in orthostatic symptoms however the urinary incontinence is largely affecting her life.  I considered trying myrbetric however this is not covered by her insurance. Since she is still experiencing occassional orthostatic symptoms, I do not think we should restart the oxybutynin.  She  notes that several people in her family have similar issues that have improved through surgery. I think that might be a reasonable idea.  Will refer to urology for urodynamic testing and assistance with ongoing management. Appreciate their recommendations.       Unintentional weight loss    We had discontinued victoza and oxybutinin last visit and she is here for follow up. Appetite and exercise intolerance has improved. Weight is now up 1lb from last visit. She reports feeling better overall. She is still occasionally experiencing some orthostatic symptoms but says that even this is improved from last visit.   Will keep this problem active and follow up on symptoms at her next office visit but it seems like we are heading in the right direction.       PHQ-9 score was elevated at 13 at today's visit. Overall this is incongruent with symptoms although is struggling with incontinence since stopping oxybutynin last visit, which is largely impacting her life. Will continue to follow  Return in about 3 months (around 04/14/2021).   Pt discussed with Dr. GVenetia Maxon MD Internal Medicine Resident PGY-2 [facility] Pager: #[phone]/09/2020 4:25 PM

## 2021-01-15 NOTE — Assessment & Plan Note (Signed)
We had discontinued victoza and oxybutinin last visit and she is here for follow up. Appetite and exercise intolerance has improved. Weight is now up 1lb from last visit. She reports feeling better overall. She is still occasionally experiencing some orthostatic symptoms but says that even this is improved from last visit.   Will keep this problem active and follow up on symptoms at her next office visit but it seems like we are heading in the right direction.

## 2021-01-17 ENCOUNTER — Other Ambulatory Visit: Payer: Self-pay | Admitting: Internal Medicine

## 2021-01-17 ENCOUNTER — Encounter: Payer: Self-pay | Admitting: *Deleted

## 2021-01-17 MED ORDER — OXYCODONE HCL 10 MG PO TABS: ORAL_TABLET | ORAL | 0 refills | Status: DC

## 2021-01-18 NOTE — Progress Notes (Signed)
Internal Medicine Clinic Attending  Case discussed with Dr. Christian  At the time of the visit.  We reviewed the resident's history and exam and pertinent patient test results.  I agree with the assessment, diagnosis, and plan of care documented in the resident's note.  

## 2021-01-30 ENCOUNTER — Telehealth: Payer: Self-pay

## 2021-01-30 NOTE — Telephone Encounter (Signed)
Let's get MR arthrogram left hip.  Thanks.

## 2021-01-30 NOTE — Telephone Encounter (Signed)
Patient called triage phone. She said that she received a cortisone injection into her hip a couple weeks ago. She said that it did not help. She has continued to take her oxycodone but states that it only helps for a few hours. She is wanting to know what she should do next because she hurts all the time. Call back [phone]. Thanks!

## 2021-01-31 ENCOUNTER — Ambulatory Visit: Payer: Medicaid Other

## 2021-01-31 ENCOUNTER — Other Ambulatory Visit: Payer: Self-pay

## 2021-01-31 DIAGNOSIS — M25552 Pain in left hip: Secondary | ICD-10-CM

## 2021-01-31 NOTE — Telephone Encounter (Signed)
Patient aware.   MRI order made.

## 2021-02-04 ENCOUNTER — Inpatient Hospital Stay: Admission: RE | Admit: 2021-02-04 | Payer: Medicaid Other | Source: Ambulatory Visit

## 2021-02-06 ENCOUNTER — Ambulatory Visit: Payer: Medicaid Other

## 2021-02-15 ENCOUNTER — Telehealth: Payer: Self-pay

## 2021-02-15 NOTE — Telephone Encounter (Signed)
Patient notified that 3 Rxs were written for oxycodone on 7/10 for July, August, and September. She will call pharmacy now.

## 2021-02-15 NOTE — Telephone Encounter (Signed)
Oxycodone HCl 10 MG TABS , refill request @ [id number] - [place], [place] - [address]  

## 2021-02-22 ENCOUNTER — Ambulatory Visit: Payer: Medicaid Other | Attending: Physician Assistant | Admitting: Physical Therapy

## 2021-02-22 ENCOUNTER — Other Ambulatory Visit: Payer: Self-pay | Admitting: Internal Medicine

## 2021-02-22 ENCOUNTER — Other Ambulatory Visit: Payer: Self-pay

## 2021-02-22 ENCOUNTER — Encounter: Payer: Self-pay | Admitting: Physical Therapy

## 2021-02-22 DIAGNOSIS — E119 Type 2 diabetes mellitus without complications: Secondary | ICD-10-CM

## 2021-02-22 DIAGNOSIS — M25552 Pain in left hip: Secondary | ICD-10-CM | POA: Diagnosis present

## 2021-02-22 DIAGNOSIS — M6281 Muscle weakness (generalized): Secondary | ICD-10-CM | POA: Diagnosis present

## 2021-02-22 DIAGNOSIS — G8929 Other chronic pain: Secondary | ICD-10-CM | POA: Diagnosis present

## 2021-02-22 DIAGNOSIS — M25562 Pain in left knee: Secondary | ICD-10-CM | POA: Diagnosis present

## 2021-02-22 DIAGNOSIS — G2581 Restless legs syndrome: Secondary | ICD-10-CM

## 2021-02-22 DIAGNOSIS — R2689 Other abnormalities of gait and mobility: Secondary | ICD-10-CM | POA: Diagnosis present

## 2021-02-22 DIAGNOSIS — Z794 Long term (current) use of insulin: Secondary | ICD-10-CM

## 2021-02-22 NOTE — Therapy (Addendum)
[facility] [address] [place], [place], [address] Phone: [phone]   Fax:  [phone]  Physical Therapy Evaluation  Patient Details  Name: [name] MRN: [id number] Date of Birth: September 04, 1960 Referring Provider (PT): Cristie Hem, [place]   Encounter Date: 02/22/2021   PT End of Session - 02/22/21 1150     Visit Number 1    Number of Visits 13    Date for PT Re-Evaluation 04/05/21    Authorization Type [employer]    PT Start Time 1148    PT Stop Time 1220    PT Time Calculation (min) 32 min    Activity Tolerance Patient tolerated treatment well    Behavior During Therapy WFL for tasks assessed/performed             Past Medical History:  Diagnosis Date   Adhesive capsulitis of right shoulder    with underlying tendinopathy rotator cuff   Arthritis    Diabetes mellitus    oral tx   Fibromyalgia    GERD (gastroesophageal reflux disease)    History of post-sterilization tuboplasty 2000   Plantar fasciitis    Right   Shortness of breath    with exertion   Sleep apnea 5 plus yrs   study -pt could not sleep test inconclusive.    Tear of meniscus of left knee    x2   Tear of meniscus of right knee     Past Surgical History:  Procedure Laterality Date   CARPAL TUNNEL RELEASE Bilateral    CHOLECYSTECTOMY     FOOT TENDON SURGERY     KNEE ARTHROSCOPY  10/10/2011   Procedure: ARTHROSCOPY KNEE;  Surgeon: Nadara Mustard, MD;  Location: [facility] OR;  Service: Orthopedics;  Laterality: Right;  Right Knee Arthroscopy and Excision Medial Meniscus   MENISCUS REPAIR     R and L (L x2)   ROTATOR CUFF REPAIR Left    TONSILLECTOMY     TOTAL KNEE ARTHROPLASTY Left 06/06/2020   Procedure: LEFT TOTAL KNEE ARTHROPLASTY;  Surgeon: Tarry Kos, MD;  Location: [facility] OR;  Service: Orthopedics;  Laterality: Left;   TUBAL LIGATION      There were no vitals filed for this visit.    Subjective Assessment - 02/22/21  1152     Subjective pt arrives to session reporting hx of L knee replacement on 11/59/2022 and had one visit with PT then contract Covid and was unable make it back into the clinic. she reports continued  L knee pain and L hip pain. reports she is going to the MD and reports the L knee needs to get stronger and the plan is to replace the L hip. She got an injection in the hip about month ago and reported the pain in the knee went away, but the knee and hip are returning.    Limitations Walking;House hold activities    Patient Stated Goals Decreased pain and to get around better    Currently in Pain? Yes    Pain Score 5    last took meds for pain before session   Pain Location Knee    Pain Orientation Left    Pain Descriptors / Indicators Aching;Sharp;Constant    Pain Type Chronic pain    Pain Onset More than a month ago    Pain Frequency Intermittent    Aggravating Factors  walking, standing, laying down at night    Pain Relieving Factors over the coutner gels/ pain  meds,    Effect of Pain on Daily Activities limited standing/ walking tolerance    Multiple Pain Sites Yes    Pain Score 7   last took meds for pain before session   Pain Location Hip    Pain Orientation Left    Pain Descriptors / Indicators Aching;Contraction;Sharp    Pain Onset More than a month ago    Pain Frequency Constant    Aggravating Factors  standing, walking and laying down at night    Pain Relieving Factors pain medication    Effect of Pain on Daily Activities limited standing / walking tolerance.                [facility] PT Assessment - 02/22/21 0001       Assessment   Medical Diagnosis Hx of total knee replacement, left    Referring Provider (PT) Cristie Hem, PA-C    Onset Date/Surgical Date 06/06/20    Hand Dominance Right    Prior Therapy 1 visit in OPPT      Precautions   Precautions None      Restrictions   Weight Bearing Restrictions No      Balance Screen   Has the patient fallen in  the past 6 months Yes    How many times? 3    Has the patient had a decrease in activity level because of a fear of falling?  No    Is the patient reluctant to leave their home because of a fear of falling?  No      Home Environment   Living Environment Private residence    Living Arrangements Spouse/significant other;Children    Type of Home Mobile home    Home Access Ramped entrance    Home Layout One level    Home Equipment Miller - single point;Walker - 2 wheels;Shower seat;Bedside commode      Prior Function   Level of Independence Independent    Vocation Unemployed    Leisure Caring for husband who is disabled, paraplegic      Cognition   Overall Cognitive Status Within Functional Limits for tasks assessed      AROM   AROM Assessment Site Hip    Right/Left Hip Right;Left    Right Hip Flexion 120    Left Hip Flexion 110    Right/Left Knee Right;Left    Left Knee Extension 2    Left Knee Flexion 120      Strength   Strength Assessment Site Hip;Knee    Right/Left Hip Right;Left    Right Hip Flexion 4/5    Right Hip ABduction 3+/5    Right Hip ADduction 5/5    Left Hip Flexion 4-/5    Left Hip ABduction 3+/5    Left Hip ADduction 5/5    Right/Left Knee Right;Left    Right Knee Flexion 5/5    Right Knee Extension 5/5    Left Knee Flexion 4+/5    Left Knee Extension 4+/5      Palpation   Palpation comment TTP along the semi-membranosus, the proximal gastroc/ soleuse. the mid to distal quad and the glute emd/ min as well as the greater trochanter.      Ambulation/Gait   Ambulation/Gait Yes    Gait Comments limited stance on LLE, / stride on the R, trendelenberg pattern with antalgic pattern                        Objective measurements  completed on examination: See above findings.               PT Education - 02/22/21 1223     Education Details evaluation findings, POC, goals, HEP    Person(s) Educated Patient    Methods  Explanation;Verbal cues;Handout    Comprehension Verbalized understanding;Verbal cues required              PT Short Term Goals - 02/22/21 1230       PT SHORT TERM GOAL #1   Title pt will be ind in an initial HEP    Baseline no previous HEP    Time 4    Period Weeks    Status New    Target Date 03/22/21      PT SHORT TERM GOAL #2   Title perform functional test Tug vs berg balance    Baseline --    Time 1    Period Weeks    Status New    Target Date 03/22/21      PT SHORT TERM GOAL #3   Title -    Baseline -               PT Long Term Goals - 02/22/21 1230       PT LONG TERM GOAL #1   Title increase L knee strength to >/= 4+/5 for functional standing/ walking tolerance    Baseline see flowsheet    Time 8    Period Weeks    Status New    Target Date 04/19/21      PT LONG TERM GOAL #2   Title increase L hip gross strength to >/= 4+/5 to promote hip stability and maximize function    Baseline see flowsheet    Time 8    Period Weeks    Status New    Target Date 04/19/21      PT LONG TERM GOAL #3   Title pt to be able to demo efficient walking/ standing posture demonstrating heel strike/ toe off reciprocal pattern with min antalgic limp    Baseline limited stance on LLE, / stride on the R, trendelenberg pattern with antalgic pattern    Time 8    Period Weeks    Status New    Target Date 04/19/21      PT LONG TERM GOAL #4   Title pt to be IND with all HEP and is able to maintain and progress current LOF IND    Baseline no previous HEP    Time 8    Period Weeks    Status New    Target Date 04/19/21      PT LONG TERM GOAL #5   Title -      PT LONG TERM GOAL #6   Title -    Baseline -      PT LONG TERM GOAL #7   Title -    Baseline -                    Plan - 02/22/21 1224     Clinical Impression Statement pt is a pleasant 60 y.o F presenting to OPPT with CC of L knee pain/ weakness from TKA that occured on 06/06/2020 and was  unable to attend physical therapy due to contracting COVID. She demonstrates functional knee ROM but exhibits weakness with both knee and hip MMT. She would benefit from physical therapy to decrease hip/ knee pain, improve strength and maximize her function  by addressing the deficits listed.    Personal Factors and Comorbidities Comorbidity 2;Past/Current Experience    Comorbidities hx of DM and fibromyalgia    Examination-Activity Limitations Stand;Locomotion Level;Stairs;Sleep;Squat    Stability/Clinical Decision Making Evolving/Moderate complexity    Clinical Decision Making Moderate    Rehab Potential Good    PT Frequency 1x / week    PT Duration 8 weeks    PT Treatment/Interventions ADLs/Self Care Home Management;Cryotherapy;Electrical Stimulation;Iontophoresis 4mg /ml Dexamethasone;Moist Heat;Ultrasound;Balance training;Therapeutic exercise;Therapeutic activities;Functional mobility training;Stair training;Gait training;Patient/family education;Manual techniques;Dry needling;Passive range of motion;Taping;Joint Manipulations;Neuromuscular re-education    PT Next Visit Plan review/ update HEP PRN, berg vs TUG (updated goals based on assessment) gross hip/ knee strengthening, gait training, stair training, balance    PT Home Exercise Plan [facility] - LAQ qith band, standing hip abd, sit to stand, SLR    Consulted and Agree with Plan of Care Patient             Patient will benefit from skilled therapeutic intervention in order to improve the following deficits and impairments:  Abnormal gait, Decreased endurance, Obesity, Decreased activity tolerance, Pain, Decreased mobility, Decreased strength, Decreased balance, Improper body mechanics, Postural dysfunction  Visit Diagnosis: Chronic pain of left knee - Plan: PT plan of care cert/re-cert  Pain in left hip - Plan: PT plan of care cert/re-cert  Muscle weakness (generalized) - Plan: PT plan of care cert/re-cert  Other abnormalities of  gait and mobility - Plan: PT plan of care cert/re-cert     Problem List Patient Active Problem List   Diagnosis Date Noted   Unintentional weight loss 12/25/2020   Primary osteoarthritis of left knee 06/05/2020   Obesity (BMI 30-39.9) 04/02/2019   Mixed incontinence urge and stress 01/01/2019   Left hip pain 12/05/2017   Chronic midline low back pain with bilateral sciatica 01/31/2017   Restless legs syndrome (RLS) 10/24/2016   Long-term current use of opiate analgesic 03/01/2016   Insomnia 12/02/2012   Fibromyalgia 05/23/2012   Healthcare maintenance 03/01/2011   Hyperlipidemia 07/27/2010   DM2 (diabetes mellitus, type 2) ([facility]) 07/26/2010    Claris Pech PT, DPT, LAT, ATC  02/22/21  12:40 PM     [facility] [address] [place], [place], [address] Phone: [phone]   Fax:  [phone]  Name: Legend Carbonaro MRN: [id number] Date of Birth: [birth year]    Check all possible CPT codes: 97110- Therapeutic Exercise, [phone]- Neuro Re-education, [phone] - Gait Training, [phone] - Manual Therapy, [phone] - Therapeutic Activities, [phone] - Self Care, and [phone] - Physical performance training

## 2021-02-23 ENCOUNTER — Other Ambulatory Visit: Payer: Medicaid Other

## 2021-03-01 ENCOUNTER — Ambulatory Visit: Payer: Medicaid Other | Admitting: Physical Therapy

## 2021-03-01 ENCOUNTER — Encounter: Payer: Self-pay | Admitting: Physical Therapy

## 2021-03-01 ENCOUNTER — Other Ambulatory Visit: Payer: Self-pay

## 2021-03-01 DIAGNOSIS — G8929 Other chronic pain: Secondary | ICD-10-CM

## 2021-03-01 DIAGNOSIS — R2689 Other abnormalities of gait and mobility: Secondary | ICD-10-CM

## 2021-03-01 DIAGNOSIS — M6281 Muscle weakness (generalized): Secondary | ICD-10-CM

## 2021-03-01 DIAGNOSIS — M25552 Pain in left hip: Secondary | ICD-10-CM

## 2021-03-01 DIAGNOSIS — M25562 Pain in left knee: Secondary | ICD-10-CM | POA: Diagnosis not present

## 2021-03-01 NOTE — Therapy (Addendum)
L knee strength to >/= 4+/5 for functional standing/ walking tolerance    Period Weeks    Status On-going      PT LONG TERM GOAL #2   Title increase L hip gross strength to >/= 4+/5 to promote hip stability and maximize function    Status On-going      PT LONG TERM GOAL #3   Title pt to be able to demo efficient  walking/ standing posture demonstrating heel strike/ toe off reciprocal pattern with min antalgic limp    Status On-going      PT LONG TERM GOAL #4   Title pt to be IND with all HEP and is able to maintain and progress current LOF IND    Period Weeks    Status On-going      PT LONG TERM GOAL #5   Title increase 5 x STS to </= 10 sec, and increase berg balance by >/= 3 points to demo improvement in balance    Baseline -    Period Weeks    Status New    Target Date 04/19/21                   Plan - 03/01/21 1004     Clinical Impression Statement pt reports consistency with her HEP but reports feeling alittle more sore which is likely related to DOMS. functional testing revealed 13 sec 5 x sit to stnad and scored 50/56 on the berg balance. Continued working hip/ knee strengthening and gait training with focus on heel strike/ toe off to reduce antalgic pattern.    PT Treatment/Interventions ADLs/Self Care Home Management;Cryotherapy;Electrical Stimulation;Iontophoresis 56m/ml Dexamethasone;Moist Heat;Ultrasound;Balance training;Therapeutic exercise;Therapeutic activities;Functional mobility training;Stair training;Gait training;Patient/family education;Manual techniques;Dry needling;Passive range of motion;Taping;Joint Manipulations;Neuromuscular re-education    PT Next Visit Plan review/ update HEP PRN,  gross hip/ knee strengthening, gait training, stair training, balance    [facility], standing hip abd, sit to stand, SLR             Patient will benefit from skilled therapeutic intervention in order to improve the following deficits and impairments:  Abnormal gait, Decreased endurance, Obesity, Decreased activity tolerance, Pain, Decreased mobility, Decreased strength, Decreased balance, Improper body mechanics, Postural dysfunction  Visit Diagnosis: Chronic pain of left knee  Pain in left hip  Muscle weakness (generalized)  Other  abnormalities of gait and mobility     Problem List Patient Active Problem List   Diagnosis Date Noted   Unintentional weight loss 12/25/2020   Primary osteoarthritis of left knee 06/05/2020   Obesity (BMI 30-39.9) 04/02/2019   Mixed incontinence urge and stress 01/01/2019   Left hip pain 12/05/2017   Chronic midline low back pain with bilateral sciatica 01/31/2017   Restless legs syndrome (RLS) 10/24/2016   Long-term current use of opiate analgesic 03/01/2016   Insomnia 12/02/2012   Fibromyalgia 05/23/2012   Healthcare maintenance 03/01/2011   Hyperlipidemia 07/27/2010   DM2 (di[place], type 2) [place] 07/26/2010   Taji Barretto PT, DPT, LAT, ATC  03/01/21  10:16 AM      [facility] [address]Phone: [phone]  Fax:  [phone] Name: GDresden AmentMRN: [id number] of Birth: [birth year]    PHYSICAL THERAPY DISCHARGE SUMMARY  Visits from Start of Care: 2  Current functional level related to goals / functional outcomes: See goals   Remaining deficits: Current status unknown  [place], [place], [address] Phone: [phone]   Fax:  [phone]  Physical Therapy Treatment / Discharge  Patient Details  Name: [name] MRN: [id number] Date of Birth: September 27, 1960 Referring Provider (PT): Aundra Dubin, [place]   Encounter Date: 03/01/2021   PT End of Session - 03/01/21 0935     Visit Number 2    Number of Visits 13    Date for PT Re-Evaluation 04/05/21    Authorization Type [employer]    PT Start Time 0932    PT Stop Time 1013    PT Time Calculation (min) 41 min    Activity Tolerance Patient tolerated treatment well             Past Medical History:  Diagnosis Date   Adhesive capsulitis of right shoulder    with underlying tendinopathy rotator cuff   Arthritis    Diabetes mellitus    oral tx   Fibromyalgia    GERD (gastroesophageal reflux disease)    History of post-sterilization tuboplasty 2000   Plantar fasciitis    Right   Shortness of breath    with exertion   Sleep apnea 5 plus yrs   study -pt could not sleep test inconclusive.    Tear of meniscus of left knee    x2   Tear of meniscus of right knee     Past Surgical History:  Procedure Laterality Date   CARPAL TUNNEL RELEASE Bilateral    CHOLECYSTECTOMY     FOOT TENDON SURGERY     KNEE ARTHROSCOPY  10/10/2011   Procedure: ARTHROSCOPY KNEE;  Surgeon: Newt Minion, MD;  Location: [facility];  Service: Orthopedics;  Laterality: Right;  Right Knee Arthroscopy and Excision Medial Meniscus   MENISCUS REPAIR     R and L (L x2)   ROTATOR CUFF REPAIR Left    TONSILLECTOMY     TOTAL KNEE ARTHROPLASTY Left 06/06/2020   Procedure: LEFT TOTAL KNEE ARTHROPLASTY;  Surgeon: Leandrew Koyanagi, MD;  Location: [facility];  Service: Orthopedics;  Laterality: Left;   TUBAL LIGATION      There were no vitals filed for this visit.   Subjective Assessment - 03/01/21 0936     Subjective " I've been doing the exercises  but i think I am getting more weak. I am still having pain inthe hip and knee, that is worse in the morning. "    Patient Stated Goals Decreased pain and to get around better    Currently in Pain? Yes    Pain Score 5     Pain Location Knee    Pain Orientation Left    Pain Descriptors / Indicators Aching;Sore    Pain Onset More than a month ago    Pain Frequency Intermittent    Aggravating Factors  walking/ standing. laying down    Pain Relieving Factors gel/ pain med    Pain Score 3    Pain Location Hip    Pain Orientation Left    Pain Descriptors / Indicators Aching    Pain Type Chronic pain    Pain Onset More than a month ago    Aggravating Factors  standing, walking    Pain Relieving Factors pain medication                [facility] PT Assessment - 03/01/21 0001       Assessment   Medical Diagnosis Hx of total knee replacement, left  L knee strength to >/= 4+/5 for functional standing/ walking tolerance    Period Weeks    Status On-going      PT LONG TERM GOAL #2   Title increase L hip gross strength to >/= 4+/5 to promote hip stability and maximize function    Status On-going      PT LONG TERM GOAL #3   Title pt to be able to demo efficient  walking/ standing posture demonstrating heel strike/ toe off reciprocal pattern with min antalgic limp    Status On-going      PT LONG TERM GOAL #4   Title pt to be IND with all HEP and is able to maintain and progress current LOF IND    Period Weeks    Status On-going      PT LONG TERM GOAL #5   Title increase 5 x STS to </= 10 sec, and increase berg balance by >/= 3 points to demo improvement in balance    Baseline -    Period Weeks    Status New    Target Date 04/19/21                   Plan - 03/01/21 1004     Clinical Impression Statement pt reports consistency with her HEP but reports feeling alittle more sore which is likely related to DOMS. functional testing revealed 13 sec 5 x sit to stnad and scored 50/56 on the berg balance. Continued working hip/ knee strengthening and gait training with focus on heel strike/ toe off to reduce antalgic pattern.    PT Treatment/Interventions ADLs/Self Care Home Management;Cryotherapy;Electrical Stimulation;Iontophoresis 56m/ml Dexamethasone;Moist Heat;Ultrasound;Balance training;Therapeutic exercise;Therapeutic activities;Functional mobility training;Stair training;Gait training;Patient/family education;Manual techniques;Dry needling;Passive range of motion;Taping;Joint Manipulations;Neuromuscular re-education    PT Next Visit Plan review/ update HEP PRN,  gross hip/ knee strengthening, gait training, stair training, balance    [facility], standing hip abd, sit to stand, SLR             Patient will benefit from skilled therapeutic intervention in order to improve the following deficits and impairments:  Abnormal gait, Decreased endurance, Obesity, Decreased activity tolerance, Pain, Decreased mobility, Decreased strength, Decreased balance, Improper body mechanics, Postural dysfunction  Visit Diagnosis: Chronic pain of left knee  Pain in left hip  Muscle weakness (generalized)  Other  abnormalities of gait and mobility     Problem List Patient Active Problem List   Diagnosis Date Noted   Unintentional weight loss 12/25/2020   Primary osteoarthritis of left knee 06/05/2020   Obesity (BMI 30-39.9) 04/02/2019   Mixed incontinence urge and stress 01/01/2019   Left hip pain 12/05/2017   Chronic midline low back pain with bilateral sciatica 01/31/2017   Restless legs syndrome (RLS) 10/24/2016   Long-term current use of opiate analgesic 03/01/2016   Insomnia 12/02/2012   Fibromyalgia 05/23/2012   Healthcare maintenance 03/01/2011   Hyperlipidemia 07/27/2010   DM2 (di[place], type 2) ([facility] 07/26/2010   Taji Barretto PT, DPT, LAT, ATC  03/01/21  10:16 AM      [facility] [address]Phone: [phone]  Fax:  [phone] Name: GDresden AmentMRN: [id number] of Birth: [birth year]    PHYSICAL THERAPY DISCHARGE SUMMARY  Visits from Start of Care: 2  Current functional level related to goals / functional outcomes: See goals   Remaining deficits: Current status unknown  L knee strength to >/= 4+/5 for functional standing/ walking tolerance    Period Weeks    Status On-going      PT LONG TERM GOAL #2   Title increase L hip gross strength to >/= 4+/5 to promote hip stability and maximize function    Status On-going      PT LONG TERM GOAL #3   Title pt to be able to demo efficient  walking/ standing posture demonstrating heel strike/ toe off reciprocal pattern with min antalgic limp    Status On-going      PT LONG TERM GOAL #4   Title pt to be IND with all HEP and is able to maintain and progress current LOF IND    Period Weeks    Status On-going      PT LONG TERM GOAL #5   Title increase 5 x STS to </= 10 sec, and increase berg balance by >/= 3 points to demo improvement in balance    Baseline -    Period Weeks    Status New    Target Date 04/19/21                   Plan - 03/01/21 1004     Clinical Impression Statement pt reports consistency with her HEP but reports feeling alittle more sore which is likely related to DOMS. functional testing revealed 13 sec 5 x sit to stnad and scored 50/56 on the berg balance. Continued working hip/ knee strengthening and gait training with focus on heel strike/ toe off to reduce antalgic pattern.    PT Treatment/Interventions ADLs/Self Care Home Management;Cryotherapy;Electrical Stimulation;Iontophoresis 56m/ml Dexamethasone;Moist Heat;Ultrasound;Balance training;Therapeutic exercise;Therapeutic activities;Functional mobility training;Stair training;Gait training;Patient/family education;Manual techniques;Dry needling;Passive range of motion;Taping;Joint Manipulations;Neuromuscular re-education    PT Next Visit Plan review/ update HEP PRN,  gross hip/ knee strengthening, gait training, stair training, balance    PT [facility], standing hip abd, sit to stand, SLR             Patient will benefit from skilled therapeutic intervention in order to improve the following deficits and impairments:  Abnormal gait, Decreased endurance, Obesity, Decreased activity tolerance, Pain, Decreased mobility, Decreased strength, Decreased balance, Improper body mechanics, Postural dysfunction  Visit Diagnosis: Chronic pain of left knee  Pain in left hip  Muscle weakness (generalized)  Other  abnormalities of gait and mobility     Problem List Patient Active Problem List   Diagnosis Date Noted   Unintentional weight loss 12/25/2020   Primary osteoarthritis of left knee 06/05/2020   Obesity (BMI 30-39.9) 04/02/2019   Mixed incontinence urge and stress 01/01/2019   Left hip pain 12/05/2017   Chronic midline low back pain with bilateral sciatica 01/31/2017   Restless legs syndrome (RLS) 10/24/2016   Long-term current use of opiate analgesic 03/01/2016   Insomnia 12/02/2012   Fibromyalgia 05/23/2012   Healthcare maintenance 03/01/2011   Hyperlipidemia 07/27/2010   DM2 (diabetes mellitus, type 2) ([facility] 07/26/2010   Taji Barretto PT, DPT, LAT, ATC  03/01/21  10:16 AM      [facility] [address]Phone: [phone]  Fax:  [phone] Name: GDresden AmentMRN: [id number] of Birth: [birth year]    PHYSICAL THERAPY DISCHARGE SUMMARY  Visits from Start of Care: 2  Current functional level related to goals / functional outcomes: See goals   Remaining deficits: Current status unknown

## 2021-03-08 ENCOUNTER — Ambulatory Visit: Payer: Medicaid Other

## 2021-03-13 ENCOUNTER — Other Ambulatory Visit: Payer: Medicaid Other

## 2021-03-13 ENCOUNTER — Inpatient Hospital Stay: Admission: RE | Admit: 2021-03-13 | Payer: Medicaid Other | Source: Ambulatory Visit

## 2021-03-15 ENCOUNTER — Ambulatory Visit: Payer: Medicaid Other | Admitting: Physical Therapy

## 2021-03-21 ENCOUNTER — Telehealth: Payer: Self-pay

## 2021-03-21 NOTE — Telephone Encounter (Signed)
I do not see anything at [employer] regarding this. Can you push me in the right direction to see who may be working on this?

## 2021-03-21 NOTE — Telephone Encounter (Signed)
Pt called and stated she was told last week that her MRI needed a prior auth. Pt also stated she called as well. I dont see any messages for this. Gwinda Passe can you see if anything is over on liz's desk on this please?

## 2021-03-22 ENCOUNTER — Ambulatory Visit: Payer: Medicaid Other

## 2021-03-29 ENCOUNTER — Ambulatory Visit: Payer: Medicaid Other | Attending: Physician Assistant

## 2021-03-29 ENCOUNTER — Other Ambulatory Visit: Payer: Self-pay

## 2021-04-05 ENCOUNTER — Ambulatory Visit: Payer: Medicaid Other | Admitting: Orthopaedic Surgery

## 2021-04-07 ENCOUNTER — Ambulatory Visit (INDEPENDENT_AMBULATORY_CARE_PROVIDER_SITE_OTHER): Payer: Medicaid Other | Admitting: Orthopaedic Surgery

## 2021-04-07 ENCOUNTER — Encounter: Payer: Self-pay | Admitting: Orthopaedic Surgery

## 2021-04-07 DIAGNOSIS — M545 Low back pain, unspecified: Secondary | ICD-10-CM | POA: Diagnosis not present

## 2021-04-07 DIAGNOSIS — M25552 Pain in left hip: Secondary | ICD-10-CM | POA: Diagnosis not present

## 2021-04-07 NOTE — Progress Notes (Signed)
Office Visit Note   Patient: [name]           Date of Birth: 05/29/1961           MRN: [id number] Visit Date: 04/07/2021              Requested by: Mitzi Hansen, MD [address] [place],  [place] [address] PCP: Mitzi Hansen, MD   Assessment & Plan: Visit Diagnoses:  1. Left hip pain   2. Low back pain, unspecified back pain laterality, unspecified chronicity, unspecified whether sciatica present     Plan: Based on findings impression is chronic lateral hip pain concerning for abductor tendinopathy as well as lumbar pathology.  Therefore we will order an MRI of the lumbar spine as well as the left hip to rule out structural abnormalities.  Follow-up after the MRI.  Follow-Up Instructions: Return if symptoms worsen or fail to improve.   Orders:  Orders Placed This Encounter  Procedures   MR Lumbar Spine w/o contrast   MR Hip Left w/o contrast   No orders of the defined types were placed in this encounter.     Procedures: No procedures performed   Clinical Data: No additional findings.   Subjective: Chief Complaint  Patient presents with   Left Hip - Pain    HPI  [name] returns today for continued left hip and back pain.  She states the trochanteric bursa injection did not help at all.  She is still quite tender over the lateral aspect of the hip but she also feels pain that radiates from the back down into her knee.  Review of Systems  Constitutional: Negative.   HENT: Negative.    Eyes: Negative.   Respiratory: Negative.    Cardiovascular: Negative.   Endocrine: Negative.   Musculoskeletal: Negative.   Neurological: Negative.   Hematological: Negative.   Psychiatric/Behavioral: Negative.    All other systems reviewed and are negative.   Objective: Vital Signs: LMP 07/04/2017   Physical Exam Vitals and nursing note reviewed.  Constitutional:      Appearance: She is well-developed.  Pulmonary:     Effort:  Pulmonary effort is normal.  Skin:    General: Skin is warm.     Capillary Refill: Capillary refill takes less than 2 seconds.  Neurological:     Mental Status: She is alert and oriented to person, place, and time.  Psychiatric:        Behavior: Behavior normal.        Thought Content: Thought content normal.        Judgment: Judgment normal.    Ortho Exam  Left hip and low back exams are unchanged.  Specialty Comments:  No specialty comments available.  Imaging: No results found.   PMFS History: Patient Active Problem List   Diagnosis Date Noted   Unintentional weight loss 12/25/2020   Primary osteoarthritis of left knee 06/05/2020   Obesity (BMI 30-39.9) 04/02/2019   Mixed incontinence urge and stress 01/01/2019   Left hip pain 12/05/2017   Chronic midline low back pain with bilateral sciatica 01/31/2017   Restless legs syndrome (RLS) 10/24/2016   Long-term current use of opiate analgesic 03/01/2016   Insomnia 12/02/2012   Fibromyalgia 05/23/2012   Healthcare maintenance 03/01/2011   Hyperlipidemia 07/27/2010   DM2 (diabetes mellitus, type 2) ([place] [facility]) 07/26/2010   Past Medical History:  Diagnosis Date   Adhesive capsulitis of right shoulder    with underlying tendinopathy rotator cuff  Arthritis    Diabetes mellitus    oral tx   Fibromyalgia    GERD (gastroesophageal reflux disease)    History of post-sterilization tuboplasty 2000   Plantar fasciitis    Right   Shortness of breath    with exertion   Sleep apnea 5 plus yrs   study -pt could not sleep test inconclusive.    Tear of meniscus of left knee    x2   Tear of meniscus of right knee     Family History  Problem Relation Age of Onset   Breast cancer Sister        both sisters have breast cancer    Past Surgical History:  Procedure Laterality Date   CARPAL TUNNEL RELEASE Bilateral    CHOLECYSTECTOMY     FOOT TENDON SURGERY     KNEE ARTHROSCOPY  10/10/2011   Procedure: ARTHROSCOPY KNEE;   Surgeon: Newt Minion, MD;  Location: [facility];  Service: Orthopedics;  Laterality: Right;  Right Knee Arthroscopy and Excision Medial Meniscus   MENISCUS REPAIR     R and L (L x2)   ROTATOR CUFF REPAIR Left    TONSILLECTOMY     TOTAL KNEE ARTHROPLASTY Left 06/06/2020   Procedure: LEFT TOTAL KNEE ARTHROPLASTY;  Surgeon: Leandrew Koyanagi, MD;  Location: [facility];  Service: Orthopedics;  Laterality: Left;   TUBAL LIGATION     Social History   Occupational History   Occupation: unemployed    Fish farm manager: UNEMPLOYED  Tobacco Use   Smoking status: Never   Smokeless tobacco: Never  Vaping Use   Vaping Use: Never used  Substance and Sexual Activity   Alcohol use: No    Alcohol/week: 0.0 standard drinks   Drug use: No   Sexual activity: Not on file

## 2021-04-08 NOTE — Progress Notes (Signed)
Office Visit   Patient ID: [name], female    DOB: [birth year], 60 y.o.   MRN: [id number]   PCP: Mitzi Hansen, MD   Subjective:  [name] is a 60 y.o. year old female who presents for follow of chronic medical conditions including diabetes, chronic pain. Please refer to problem based charting for assessment and plan.   ACTIVE MEDICATIONS   Outpatient Medications Prior to Visit  Medication Sig   rOPINIRole (REQUIP) 0.5 MG tablet TAKE 1 TABLET (0.5 MG TOTAL) BY MOUTH AT BEDTIME. TAKE 1 TO 3 HOURS BEFORE BEDTIME   [DISCONTINUED] Oxycodone HCl 10 MG TABS Take one tablet by mouth every six hours as needed for pain.   ACCU-CHEK FASTCLIX LANCETS MISC Check three times a day to check blood sugar. 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 insulin dependent   Blood Glucose Monitoring Suppl (ACCU-CHEK GUIDE ME) w/Device KIT 1 each by Does not apply route 3 (three) times daily.   cetirizine (ZYRTEC) 10 MG tablet Take 1 tablet (10 mg total) by mouth daily.   Diclofenac Sodium 3 % GEL Apply 2 g topically 3 (three) times daily.   [DISCONTINUED] aspirin EC 81 MG tablet Take 1 tablet (81 mg total) by mouth daily. To be taken after surgery (Patient not taking: Reported on 02/22/2021)   [DISCONTINUED] atorvastatin (LIPITOR) 20 MG tablet Take 1 tablet (20 mg total) by mouth daily.   [DISCONTINUED] cyclobenzaprine (FLEXERIL) 10 MG tablet Take 1 tablet (10 mg total) by mouth 2 (two) times daily as needed for muscle spasms. (Patient not taking: Reported on 02/22/2021)   [DISCONTINUED] empagliflozin (JARDIANCE) 10 MG TABS tablet Take 1 tablet (10 mg total) by mouth daily before breakfast.   [DISCONTINUED] glucose blood (ACCU-CHEK GUIDE) test strip CHECK BLOOD SUGAR 3 TIMES DAILY   [DISCONTINUED] insulin glargine (LANTUS SOLOSTAR) 100 UNIT/ML Solostar Pen Inject 30 Units into the skin daily.   [DISCONTINUED] Insulin Pen Needle (PEN NEEDLES) 32G X 4 MM MISC 1 each by Does not apply route 5 (five) times daily.    [DISCONTINUED] meloxicam (MOBIC) 7.5 MG tablet Take 1 tablet (7.5 mg total) by mouth daily as needed for up to 60 doses for pain. (Patient not taking: Reported on 02/22/2021)   [DISCONTINUED] Oxycodone HCl 10 MG TABS Take one tablet by mouth every six hours as needed for pain.   [DISCONTINUED] Oxycodone HCl 10 MG TABS Take one tablet by mouth every six hours as needed for pain. (Patient not taking: Reported on 02/22/2021)   [DISCONTINUED] pantoprazole (PROTONIX) 40 MG tablet Take 1 tablet by mouth daily.   No facility-administered medications prior to visit.     Objective:   BP 125/67 (BP Location: Left Arm, Patient Position: Sitting, Cuff Size: Normal)   Pulse 65   Temp 98.1 F (36.7 C) (Oral)   Ht _0  (1.575 m)   Wt 174 lb 14.4 oz (79.3 kg)   LMP 07/04/2017   SpO2 99%   BMI 31.99 kg/m  Wt Readings from Last 3 Encounters:  04/10/21 174 lb 14.4 oz (79.3 kg)  01/12/21 170 lb 14.4 oz (77.5 kg)  12/23/20 166 lb 12.8 oz (75.7 kg)    BP Readings from Last 3 Encounters:  04/10/21 125/67  01/12/21 123/64  12/23/20 128/71   General: well appearing, no distress Msk: analgesic gait Psych: normal mood and affect  Health Maintenance:   Health Maintenance  Topic Date Due   Zoster Vaccines- Shingrix (1 of 2) Never done   COLONOSCOPY (Pts 45-44yr Insurance coverage will  need to be confirmed)  Never done   MAMMOGRAM  12/25/2017   PAP SMEAR-Modifier  11/29/2018   COVID-19 Vaccine (2 - Booster for Janssen series) 11/22/2019   HEMOGLOBIN A1C  07/10/2021   OPHTHALMOLOGY EXAM  08/31/2021   LIPID PANEL  09/21/2021   URINE MICROALBUMIN  09/21/2021   FOOT EXAM  04/10/2022   TETANUS/TDAP  12/24/2030   INFLUENZA VACCINE  Completed   Hepatitis C Screening  Completed   HIV Screening  Completed   HPV VACCINES  Aged Out    Assessment & Plan:   Problem List Items Addressed This Visit       Endocrine   DM2 (diabetes mellitus, type 2) ([place]) - Primary (Chronic)     DIABETES TYPE 2  FOLLOW-UP: Anti-hyperglycemic agents: lantus 15U daily, jardiance 51m daily Secondary agents: lipitor 271mdaily Last A1C: 7.5 A1C today: 7.9 Med Adherence:  _0  Yes    _1  No Medication side effects:  _2  Yes    _3  No  Hypoglycemic episodes?: no Numbness of the feet? _4  Yes    _5  No Retinopathy hx? _6  Yes    _7  No Last eye exam: 08/2020 Last foot exam: 04/2020 Comments: she reports issues with hypoglycemia after increasing lantus so she is currently taking 15U. I suspect that her increase in A1C is from post-prandial blood sugars however she has only been checking her blood sugars once a day so difficult to know for certain. Ideally, we would work on getting her A1C closer to 7. Unfortunately, our options are somewhat limited by her previous intolerance to metformin and GLP-1s. She has used short acting insulin the past and is amenable to going back to this if needed. I offered to place a CGM on her to assist with this however she is worried that it will end up falling off due to her work. She is agreeable to checking her blood sugars 3x daily for a couple of weeks prior to her next office visit with me so we can see where we need to make corrections.  Plan -continue current management for now -she will follow up with me in November for review of glucose readings -foot exam performed in the office today -ophthalmology appointment arranged -A1C goal <7        Relevant Medications   atorvastatin (LIPITOR) 20 MG tablet   empagliflozin (JARDIANCE) 10 MG TABS tablet   insulin glargine (LANTUS SOLOSTAR) 100 UNIT/ML Solostar Pen   Insulin Pen Needle (PEN NEEDLES) 32G X 4 MM MISC   glucose blood (ACCU-CHEK GUIDE) test strip   Other Relevant Orders   POC Hbg A1C (Completed)   Glucose, capillary (Completed)   Basic metabolic panel (Completed)     Musculoskeletal and Integument   Osteoarthritis    She has been struggling with joint pain and had been taking mobic which helped however has  been having issues with insurance covering it. Discussed long term complications of prolonged NSAID use.  -Celebrex 100-20034maily      Relevant Medications   celecoxib (CELEBREX) 100 MG capsule   cyclobenzaprine (FLEXERIL) 10 MG tablet   Oxycodone HCl 10 MG TABS (Start on 04/26/2021)   Oxycodone HCl 10 MG TABS (Start on 05/27/2021)   Oxycodone HCl 10 MG TABS (Start on 06/26/2021)   Oxycodone HCl 10 MG TABS (Start on 07/27/2021)     Other   Hyperlipidemia (Chronic)   Relevant Medications   atorvastatin (LIPITOR) 20 MG tablet   Long-term current use of opiate analgesic (Chronic)  Current mediations: oxycodone 3m q6h prn #120/month, flexeril Indication: osteoarthritis, fibromyalgia Medication not interfering with ADLs Last UDS 09/2020 appropriate PDMP reviewed and appropriate Pain contract in place: yes  Plan:  -continue current management--refills sent to pharmacy -f/u in January with me with UDS      Restless legs syndrome (RLS) (Chronic)    Chronic and stable. Continue requip.       Relevant Medications   cyclobenzaprine (FLEXERIL) 10 MG tablet   Left hip pain (Chronic)    She has been following with orthopedics for this. They are working on getting an MRI to delineate hip vs L-spine pathology.       Mixed incontinence urge and stress (Chronic)    Urology referral scheduled        Return in about 4 weeks (around 05/08/2021) for Diabetes follow up when Dr. CDarrick Meigsis back in clinic.   Pt discussed with Dr. VElwanda Brooklyn MD Internal Medicine Resident PGY-3 [facility] 04/11/2021 2:38 PM

## 2021-04-08 NOTE — Assessment & Plan Note (Signed)
Chronic and stable  Continue requip

## 2021-04-08 NOTE — Assessment & Plan Note (Addendum)
Current mediations: oxycodone 10mg  q6h prn #120/month, flexeril Indication: osteoarthritis, fibromyalgia Medication not interfering with ADLs Last UDS 09/2020 appropriate PDMP reviewed and appropriate Pain contract in place: yes  Plan:  -continue current management--refills sent to pharmacy -f/u in January with me with UDS

## 2021-04-08 NOTE — Assessment & Plan Note (Addendum)
DIABETES TYPE 2 FOLLOW-UP: Anti-hyperglycemic agents: lantus 15U daily, jardiance 10mg  daily Secondary agents: lipitor 20mg  daily Last A1C: 7.5 A1C today: 7.9 Med Adherence:  [x]  Yes    []  No Medication side effects:  []  Yes    [x]  No  Hypoglycemic episodes?: no Numbness of the feet? []  Yes    [x]  No Retinopathy hx? [x]  Yes    [x]  No Last eye exam: 08/2020 Last foot exam: 04/2020 Comments: she reports issues with hypoglycemia after increasing lantus so she is currently taking 15U. I suspect that her increase in A1C is from post-prandial blood sugars however she has only been checking her blood sugars once a day so difficult to know for certain. Ideally, we would work on getting her A1C closer to 7. Unfortunately, our options are somewhat limited by her previous intolerance to metformin and GLP-1s. She has used short acting insulin the past and is amenable to going back to this if needed. I offered to place a CGM on her to assist with this however she is worried that it will end up falling off due to her work. She is agreeable to checking her blood sugars 3x daily for a couple of weeks prior to her next office visit with me so we can see where we need to make corrections.  Plan -continue current management for now -she will follow up with me in November for review of glucose readings -foot exam performed in the office today -ophthalmology appointment arranged -A1C goal <7

## 2021-04-10 ENCOUNTER — Other Ambulatory Visit: Payer: Self-pay

## 2021-04-10 ENCOUNTER — Ambulatory Visit: Payer: Medicaid Other | Admitting: Internal Medicine

## 2021-04-10 ENCOUNTER — Encounter: Payer: Self-pay | Admitting: Internal Medicine

## 2021-04-10 VITALS — BP 125/67 | HR 65 | Temp 98.1°F | Ht 62.0 in | Wt 174.9 lb

## 2021-04-10 DIAGNOSIS — N3946 Mixed incontinence: Secondary | ICD-10-CM | POA: Diagnosis not present

## 2021-04-10 DIAGNOSIS — Z79891 Long term (current) use of opiate analgesic: Secondary | ICD-10-CM | POA: Diagnosis not present

## 2021-04-10 DIAGNOSIS — Z794 Long term (current) use of insulin: Secondary | ICD-10-CM

## 2021-04-10 DIAGNOSIS — M25552 Pain in left hip: Secondary | ICD-10-CM

## 2021-04-10 DIAGNOSIS — E119 Type 2 diabetes mellitus without complications: Secondary | ICD-10-CM | POA: Diagnosis not present

## 2021-04-10 DIAGNOSIS — G2581 Restless legs syndrome: Secondary | ICD-10-CM | POA: Diagnosis not present

## 2021-04-10 DIAGNOSIS — E113393 Type 2 diabetes mellitus with moderate nonproliferative diabetic retinopathy without macular edema, bilateral: Secondary | ICD-10-CM | POA: Diagnosis not present

## 2021-04-10 DIAGNOSIS — E785 Hyperlipidemia, unspecified: Secondary | ICD-10-CM | POA: Diagnosis not present

## 2021-04-10 DIAGNOSIS — E113399 Type 2 diabetes mellitus with moderate nonproliferative diabetic retinopathy without macular edema, unspecified eye: Secondary | ICD-10-CM | POA: Diagnosis not present

## 2021-04-10 DIAGNOSIS — M1991 Primary osteoarthritis, unspecified site: Secondary | ICD-10-CM

## 2021-04-10 LAB — GLUCOSE, CAPILLARY: Glucose-Capillary: 282 mg/dL — ABNORMAL HIGH (ref 70–99)

## 2021-04-10 LAB — POCT GLYCOSYLATED HEMOGLOBIN (HGB A1C): Hemoglobin A1C: 7.9 % — AB (ref 4.0–5.6)

## 2021-04-10 MED ORDER — CELECOXIB 100 MG PO CAPS: 100.0000 mg | ORAL_CAPSULE | Freq: Every day | ORAL | 2 refills | Status: DC

## 2021-04-10 NOTE — Patient Instructions (Signed)
Ms.Karlisha Miranda, it was a pleasure seeing you today!  Today we discussed:  Arthritis: since you are having troubles with insurance covering Mobic, let's try celebrex. You can take 100-200mg  daily. Try to avoid using this everyday.  Diabetes: please check you blood sugars 3 times daily and follow up with me when I'm back in clinic so we can consider adjusting insulin.   Follow-up: Please plan to follow up with me again in January for your regular check up.   Please make sure to arrive 15 minutes prior to your next appointment. If you arrive late, you may be asked to reschedule.   We look forward to seeing you next time. Please call our clinic at [phone] if you have any questions or concerns. The best time to call is Monday-Friday from 9am-4pm, but there is someone available 24/7. If after hours or the weekend, call the main hospital number and ask for the Internal Medicine Resident On-Call. If you need medication refills, please notify your pharmacy one week in advance and they will send [name] a request.  Thank you for letting us take part in your care. Wishing you the best!

## 2021-04-11 DIAGNOSIS — M1990 Unspecified osteoarthritis, unspecified site: Secondary | ICD-10-CM | POA: Insufficient documentation

## 2021-04-11 LAB — BASIC METABOLIC PANEL
BUN/Creatinine Ratio: 21 (ref 9–23)
BUN: 16 mg/dL (ref 6–24)
CO2: 24 mmol/L (ref 20–29)
Calcium: 9.3 mg/dL (ref 8.7–10.2)
Chloride: 104 mmol/L (ref 96–106)
Creatinine, Ser: 0.75 mg/dL (ref 0.57–1.00)
Glucose: 291 mg/dL — ABNORMAL HIGH (ref 70–99)
Potassium: 4.8 mmol/L (ref 3.5–5.2)
Sodium: 143 mmol/L (ref 134–144)
eGFR: 92 mL/min/{1.73_m2} (ref >59)

## 2021-04-11 MED ORDER — ACCU-CHEK GUIDE VI STRP: ORAL_STRIP | 9 refills | Status: DC

## 2021-04-11 MED ORDER — OXYCODONE HCL 10 MG PO TABS: 10.0000 mg | ORAL_TABLET | Freq: Four times a day (QID) | ORAL | 0 refills | Status: AC | PRN

## 2021-04-11 MED ORDER — ATORVASTATIN CALCIUM 20 MG PO TABS: 20.0000 mg | ORAL_TABLET | Freq: Every day | ORAL | 1 refills | Status: DC

## 2021-04-11 MED ORDER — LANTUS SOLOSTAR 100 UNIT/ML ~~LOC~~ SOPN: 15.0000 [IU] | PEN_INJECTOR | Freq: Every day | SUBCUTANEOUS | 1 refills | Status: DC

## 2021-04-11 MED ORDER — EMPAGLIFLOZIN 10 MG PO TABS: 10.0000 mg | ORAL_TABLET | Freq: Every day | ORAL | 3 refills | Status: DC

## 2021-04-11 MED ORDER — PANTOPRAZOLE SODIUM 40 MG PO TBEC: 40.0000 mg | DELAYED_RELEASE_TABLET | Freq: Every day | ORAL | 1 refills | Status: DC

## 2021-04-11 MED ORDER — OXYCODONE HCL 10 MG PO TABS: 10.0000 mg | ORAL_TABLET | Freq: Four times a day (QID) | ORAL | 0 refills | Status: DC | PRN

## 2021-04-11 MED ORDER — PEN NEEDLES 32G X 4 MM MISC: 1.0000 | Freq: Every day | 1 refills | Status: DC

## 2021-04-11 MED ORDER — CYCLOBENZAPRINE HCL 10 MG PO TABS: 10.0000 mg | ORAL_TABLET | Freq: Two times a day (BID) | ORAL | 3 refills | Status: DC | PRN

## 2021-04-11 NOTE — Assessment & Plan Note (Signed)
She has been following with orthopedics for this. They are working on getting an MRI to delineate hip vs L-spine pathology.

## 2021-04-11 NOTE — Assessment & Plan Note (Signed)
She continues to experience some pain in this knee but it is slowly improving.

## 2021-04-11 NOTE — Assessment & Plan Note (Signed)
Urology referral scheduled

## 2021-04-11 NOTE — Assessment & Plan Note (Addendum)
>>  ASSESSMENT AND PLAN FOR OSTEOARTHRITIS WRITTEN ON 04/11/2021  2:40 PM BY Amman Bartel, MD  She has been struggling with joint pain and had been taking mobic which helped however has been having issues with insurance covering it. Discussed long term complications of prolonged NSAID use.  -Celebrex 100-200mg  daily -Continue opioid therapy as noted seperate  >>ASSESSMENT AND PLAN FOR PRIMARY OSTEOARTHRITIS OF LEFT KNEE WRITTEN ON 04/11/2021  2:15 PM BY Maxmilian Trostel, MD  She continues to experience some pain in this knee but it is slowly improving.

## 2021-04-12 NOTE — Progress Notes (Signed)
Internal Medicine Clinic Attending  Case discussed with Dr. Christian  At the time of the visit.  We reviewed the resident's history and exam and pertinent patient test results.  I agree with the assessment, diagnosis, and plan of care documented in the resident's note.  

## 2021-04-15 ENCOUNTER — Other Ambulatory Visit: Payer: Self-pay | Admitting: Internal Medicine

## 2021-04-15 DIAGNOSIS — N3941 Urge incontinence: Secondary | ICD-10-CM

## 2021-05-09 ENCOUNTER — Other Ambulatory Visit: Payer: Medicaid Other

## 2021-05-15 ENCOUNTER — Telehealth: Payer: Self-pay

## 2021-05-15 NOTE — Telephone Encounter (Signed)
Requesting to speak with a nurse about getting flu medication. Per patient her grand kids has the flu, and she has been around them. Please call pt back.

## 2021-05-15 NOTE — Telephone Encounter (Signed)
Thanks for the note. I reviewed her chart more. Given that she was vaccinated on 9/26, she should have some good protection. I would not recommend postexposure prophylaxis at this time. Rather, she can call us back if she develops any early symptoms and we can treat with tamiflu.

## 2021-05-15 NOTE — Telephone Encounter (Addendum)
Returned call to patient. States she was around 2 of her grandchildren for ~ 4 hours yesterday. They have since tested positive for influenza. Their symptoms are fever, vomiting, poor appetite. Patient has yet to experience any symptoms. She received flu vaccine on 04/10/21. She is wanting to know if there is anything she can take prophylactically to prevent getting symptoms.

## 2021-05-15 NOTE — Telephone Encounter (Signed)
Patient notified of info below. She is very [employer] attorney.

## 2021-06-06 ENCOUNTER — Other Ambulatory Visit: Payer: Self-pay

## 2021-06-06 ENCOUNTER — Ambulatory Visit: Payer: Medicaid Other | Admitting: Orthopaedic Surgery

## 2021-06-06 DIAGNOSIS — M25552 Pain in left hip: Secondary | ICD-10-CM | POA: Diagnosis not present

## 2021-06-06 NOTE — Progress Notes (Signed)
Office Visit Note   Patient: [name]           Date of Birth: 1961-04-08           MRN: [id number] Visit Date: 06/06/2021              Requested by: Mitzi Hansen, MD [address],  [place] [address] PCP: Mitzi Hansen, MD   Assessment & Plan: Visit Diagnoses:  1. Pain in left hip     Plan: Impression is chronic left hip pain concerning for abductor tendinopathy versus lumbar pathology.  At this point, would like to get an MRI of her left hip in addition to her lumbar spine to further evaluate for structural abnormalities as she has not had relief from several months of physical therapy, cortisone injections or oral anti-inflammatories.  She will follow-up with [name] once this is been completed.  Call with concerns or questions in the meantime.  Follow-Up Instructions: Return for after MRI.   Orders:  No orders of the defined types were placed in this encounter.  No orders of the defined types were placed in this encounter.     Procedures: No procedures performed   Clinical Data: No additional findings.   Subjective: Chief Complaint  Patient presents with   Left Hip - Pain    HPI patient is a pleasant 60 year old female who comes in today with continued left lateral hip, groin and buttock pain.  She has been dealing with this for several months.  The pain she has is worse lying on the left side as well as with walking.  She denies any paresthesias to the left lower extremity.  She is undergone left hip trochanteric bursa injection without relief in symptoms.  She has had several months of formal physical therapy as well as a guided home exercise program all without relief.  Review of Systems as detailed in HPI.  All others reviewed and are negative.   Objective: Vital Signs: LMP 07/04/2017   Physical Exam well-developed well-nourished female in no acute distress.  Alert and oriented x3.  Ortho Exam left hip exam shows marked  tenderness to the greater trochanter.  Negative logroll and negative FADIR.  Negative straight leg raise.  No spinous or paraspinous tenderness.  No pain with lumbar flexion or extension.  Specialty Comments:  No specialty comments available.  Imaging: No new imaging   PMFS History: Patient Active Problem List   Diagnosis Date Noted   Osteoarthritis 04/11/2021   Primary osteoarthritis of left knee 06/05/2020   Obesity (BMI 30-39.9) 04/02/2019   Mixed incontinence urge and stress 01/01/2019   Left hip pain 12/05/2017   Chronic midline low back pain with bilateral sciatica 01/31/2017   Restless legs syndrome (RLS) 10/24/2016   Long-term current use of opiate analgesic 03/01/2016   Fibromyalgia 05/23/2012   Healthcare maintenance 03/01/2011   Hyperlipidemia 07/27/2010   DM2 (diabetes mellitus, type 2) ([place]) 07/26/2010   Past Medical History:  Diagnosis Date   Adhesive capsulitis of right shoulder    with underlying tendinopathy rotator cuff   Arthritis    Diabetes mellitus    oral tx   Fibromyalgia    GERD (gastroesophageal reflux disease)    History of post-sterilization tuboplasty 2000   Plantar fasciitis    Right   Shortness of breath    with exertion   Sleep apnea 5 plus yrs   study -pt could not sleep test inconclusive.    Tear of meniscus  of left knee    x2   Tear of meniscus of right knee     Family History  Problem Relation Age of Onset   Breast cancer Sister        both sisters have breast cancer    Past Surgical History:  Procedure Laterality Date   CARPAL TUNNEL RELEASE Bilateral    CHOLECYSTECTOMY     FOOT TENDON SURGERY     KNEE ARTHROSCOPY  10/10/2011   Procedure: ARTHROSCOPY KNEE;  Surgeon: Newt Minion, MD;  Location: [facility];  Service: Orthopedics;  Laterality: Right;  Right Knee Arthroscopy and Excision Medial Meniscus   MENISCUS REPAIR     R and L (L x2)   ROTATOR CUFF REPAIR Left    TONSILLECTOMY     TOTAL KNEE ARTHROPLASTY Left 06/06/2020    Procedure: LEFT TOTAL KNEE ARTHROPLASTY;  Surgeon: Leandrew Koyanagi, MD;  Location: [facility];  Service: Orthopedics;  Laterality: Left;   TUBAL LIGATION     Social History   Occupational History   Occupation: unemployed    Fish farm manager: UNEMPLOYED  Tobacco Use   Smoking status: Never   Smokeless tobacco: Never  Vaping Use   Vaping Use: Never used  Substance and Sexual Activity   Alcohol use: No    Alcohol/week: 0.0 standard drinks   Drug use: No   Sexual activity: Not on file

## 2021-06-07 ENCOUNTER — Other Ambulatory Visit: Payer: Self-pay

## 2021-06-07 DIAGNOSIS — Z7689 Persons encountering health services in other specified circumstances: Secondary | ICD-10-CM

## 2021-06-12 ENCOUNTER — Ambulatory Visit (INDEPENDENT_AMBULATORY_CARE_PROVIDER_SITE_OTHER): Payer: Medicaid Other | Admitting: Internal Medicine

## 2021-06-12 ENCOUNTER — Encounter: Payer: Self-pay | Admitting: Internal Medicine

## 2021-06-12 VITALS — BP 164/83 | HR 66 | Temp 98.1°F | Ht 62.0 in | Wt 173.4 lb

## 2021-06-12 DIAGNOSIS — R634 Abnormal weight loss: Secondary | ICD-10-CM

## 2021-06-12 DIAGNOSIS — E113399 Type 2 diabetes mellitus with moderate nonproliferative diabetic retinopathy without macular edema, unspecified eye: Secondary | ICD-10-CM | POA: Diagnosis not present

## 2021-06-12 DIAGNOSIS — R61 Generalized hyperhidrosis: Secondary | ICD-10-CM | POA: Insufficient documentation

## 2021-06-12 MED ORDER — LIRAGLUTIDE 18 MG/3ML ~~LOC~~ SOPN: 0.6000 mg | PEN_INJECTOR | Freq: Every day | SUBCUTANEOUS | 0 refills | Status: DC

## 2021-06-12 MED ORDER — GLYCOPYRROLATE 1 MG PO TABS: 1.0000 mg | ORAL_TABLET | Freq: Two times a day (BID) | ORAL | 0 refills | Status: DC

## 2021-06-12 NOTE — Patient Instructions (Addendum)
It was nice seeing you today! Thank you for choosing [facility] for your Primary Care.    Today we talked about:   Sweating: We will continue to investigate other causes of this. For now, start Robinul (Glycopyrrolate). Take 1 tablet twice per day. Watch for signs of dehydration or dizziness  Diabetes: We will restart Victoza at a low dose. Monitor your morning sugars close and call if you experience any low sugars. You will likely require decreases in Lantus dose.   Return this week for your pap smear

## 2021-06-12 NOTE — Progress Notes (Signed)
CC: Sweating  HPI:  [name] is a 60 y.o. with a PMHx as listed below who presents to the clinic for sweating.   Please see the Encounters tab for problem-based Assessment & Plan regarding status of patient's acute and chronic conditions.  Past Medical History:  Diagnosis Date   Adhesive capsulitis of right shoulder    with underlying tendinopathy rotator cuff   Arthritis    Diabetes mellitus    oral tx   Fibromyalgia    GERD (gastroesophageal reflux disease)    History of post-sterilization tuboplasty 2000   Plantar fasciitis    Right   Shortness of breath    with exertion   Sleep apnea 5 plus yrs   study -pt could not sleep test inconclusive.    Tear of meniscus of left knee    x2   Tear of meniscus of right knee    Review of Systems: Review of Systems  Constitutional:  Positive for chills ("after sweating") and weight loss (in the past 3 years. none recently). Negative for diaphoresis, fever and malaise/fatigue.  HENT:  Negative for congestion, ear discharge, sinus pain and sore throat.   Eyes:  Negative for blurred vision, double vision and photophobia.  Respiratory:  Negative for cough, shortness of breath and wheezing.   Cardiovascular:  Negative for chest pain, palpitations and leg swelling.  Gastrointestinal:  Negative for abdominal pain, blood in stool, constipation, diarrhea, melena, nausea and vomiting.  Musculoskeletal:  Positive for joint pain (chronic unchanged). Negative for falls and myalgias.  Skin:  Negative for itching and rash.  Neurological:  Negative for dizziness, focal weakness, weakness and headaches.  Psychiatric/Behavioral:  Negative for substance abuse. The patient is not nervous/anxious.    Physical Exam:  There were no vitals filed for this visit. Physical Exam Vitals and nursing note reviewed.  Constitutional:      General: She is not in acute distress.    Appearance: She is overweight. She is not toxic-appearing or  diaphoretic.  HENT:     Head: Normocephalic and atraumatic.  Eyes:     General: No scleral icterus.    Extraocular Movements: Extraocular movements intact.     Pupils: Pupils are equal, round, and reactive to light.  Cardiovascular:     Rate and Rhythm: Normal rate and regular rhythm.     Heart sounds: No murmur heard.   No gallop.  Pulmonary:     Effort: Pulmonary effort is normal. No respiratory distress.     Breath sounds: Normal breath sounds. No wheezing, rhonchi or rales.  Abdominal:     General: Bowel sounds are normal. There is no distension.     Palpations: Abdomen is soft.     Tenderness: There is no abdominal tenderness. There is no guarding.  Musculoskeletal:     Cervical back: Normal range of motion and neck supple. No rigidity.     Right lower leg: No edema.     Left lower leg: No edema.  Lymphadenopathy:     Cervical: No cervical adenopathy.  Skin:    General: Skin is warm and dry.     Findings: No rash.  Neurological:     General: No focal deficit present.     Mental Status: She is alert and oriented to person, place, and time. Mental status is at baseline.  Psychiatric:        Mood and Affect: Mood normal.        Behavior: Behavior normal.  Thought Content: Thought content normal.        Judgment: Judgment normal.    Assessment & Plan:   See Encounters Tab for problem based charting.  Patient discussed with Dr. Evette Doffing

## 2021-06-12 NOTE — Assessment & Plan Note (Addendum)
[name] states she has been experiencing severe generalized sweating for at least 3 years now but it was present before then too. It has been progressively worsened. Sweating occurs both during the day and night. At night, she estimates having to change her clothing at least 2-3 times. She denies any known fevers, but endorses chills after sweating. She denies any neck swelling, dysphagia, palpitations, chest pain, SOB, N/V/D, abdominal pain, changes in stool, dysuria. She denies any unusual characteristics or changes in her chronic joint pain.   She notes her mother and niece both suffered with similar symptoms. Her niece has recently been started on Robinul with good response. [name] is requesting we consider treating her with this as well.   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 No specific symptoms at this time to raise concern for particular secondary causes. Unlikely to be medication induced as medications have changed quite a bit over the last 3 years with persistence of symptoms.    TSH within the last 6 months has been WNL.   HIV and hepatitis C were checked in 2017 and both negative although this precedes symptoms.    ESR was obtained in 2020 and within normal limits, however no history of for CRP since 2014.    No history of LFT elevations   No history of persistent leukocytosis of unexplained etiology  Patient endorses frequent dental infections and was apparently told by her dentist that she has a bony erosion, which may place her at risk for endocarditis, however given the longevity of hyperhidrosis, endocarditis is less likely.    No pulmonary symptoms, however may be worth obtaining CT chest at 1 point to evaluate for any nodules or occult infections.   - Will start with symptomatic treatment: Robinul 1 mg BID  - Consider repeating HIV and hepatitis C  antibodies - Consider obtaining CRP and repeat ESR - May ultimately require a CT of the chest, abdomen, and pelvis for further evaluation per guideline recommendations

## 2021-06-13 NOTE — Assessment & Plan Note (Addendum)
[name] states she has a history of unintentional weight loss with as much as 50 lbs lost in 2 months. She notes her PCP discontinued Victoza and Oxybutynin due to this but she is now gaining weight and would like to restart Victoza.   She endorses a long time history of poor appetite and states she has to be mindful about not missing meals. She denies any stool changes, diarrhea, palpitations, dizziness. She has a screening colonoscopy schedule for the end of the month. She has not done her Pap smear or Mammogram yet.   [name] has a strong family history of cancer, including 2 sisters with breast cancer before the age of 62 and 3 sisters with "abnormal cervical cells."  She also states that 1 sister recently passed away after hysterectomy discovered diffuse metastasis of unknown origin.  Assessment/Plan:  Per chart review, patient's most drastic weight loss occurred in 2019 at which time she lost approximately 30 pounds in 6 months.  In the past 2 years, she has lost an additional 30 pounds with 17 pound loss in the last 1 year.  Weight has been stable since her visit in September though.    Difficult to say if this is secondary to GLP-1 use or GLP-1 use may have just exacerbated her weight loss.  She certainly requires age-appropriate cancer screening.  Colonoscopy already scheduled.  Mammogram has been ordered but still not done.  Last Pap smear was in 2017 with cytology only; results were negative.  She is due for Pap smear at this time and I strongly encouraged her to do so today but she has other commitments already scheduled for today.  - Monitor weight closely.  We will need to updated weight at each future visit.  Especially since we are restarting Victoza - We will follow-up colonoscopy results - Strongly urged patient schedule a visit for Pap smear within the next week - Schedule mammogram

## 2021-06-13 NOTE — Assessment & Plan Note (Signed)
Lab Results  Component Value Date   HGBA1C 7.9 (A) 04/10/2021   [name] states she would like to restart her Victoza. She understands it was stopped due to weight loss, however she has gained back some weight and has to restrict her diet. Since discontinuing Victoza at her last visit in September, her Lantus requirements have increased from 15 units daily to 30-35 units daily. With these changes, her AM CBG is between 100-130 usually. She denies any hypoglycemic events.   Assessment/Plan: Weight has stabilized at this time and [name] still has some room for improvement with her last a1c. We discussed very closely monitoring her weight and she expressed understanding.   - Restart Victoza 0.6 mg daily - Monitor AM CBG closely as will likely require decreases in Lantus. For now, continue Lantus 30 units daily.  - Follow up with PCP in the next 4 weeks for further titration.  - Continue Jardiance - A1c due end of December 2022

## 2021-06-14 NOTE — Progress Notes (Signed)
Internal Medicine Clinic Attending  Case discussed with Dr. Basaraba  At the time of the visit.  We reviewed the resident's history and exam and pertinent patient test results.  I agree with the assessment, diagnosis, and plan of care documented in the resident's note.  

## 2021-06-23 ENCOUNTER — Ambulatory Visit: Payer: Medicaid Other | Admitting: Podiatry

## 2021-06-24 ENCOUNTER — Other Ambulatory Visit: Payer: Self-pay

## 2021-06-24 ENCOUNTER — Ambulatory Visit
Admission: RE | Admit: 2021-06-24 | Discharge: 2021-06-24 | Disposition: A | Discharge status: Home / Self Care | Payer: Medicaid Other | Source: Ambulatory Visit | Attending: Orthopaedic Surgery | Admitting: Orthopaedic Surgery

## 2021-06-24 DIAGNOSIS — M25552 Pain in left hip: Secondary | ICD-10-CM

## 2021-06-24 DIAGNOSIS — M545 Low back pain, unspecified: Secondary | ICD-10-CM

## 2021-07-06 ENCOUNTER — Ambulatory Visit: Payer: Medicaid Other | Admitting: Orthopaedic Surgery

## 2021-07-06 ENCOUNTER — Telehealth: Payer: Self-pay | Admitting: Internal Medicine

## 2021-07-06 DIAGNOSIS — Z0000 Encounter for general adult medical examination without abnormal findings: Secondary | ICD-10-CM

## 2021-07-06 NOTE — Assessment & Plan Note (Signed)
She was unable to tolerate GI effects of bowel prep for colonoscopy and asks about alternative options. Reviewed these with

## 2021-07-08 ENCOUNTER — Other Ambulatory Visit: Payer: Self-pay | Admitting: Internal Medicine

## 2021-07-08 DIAGNOSIS — M1991 Primary osteoarthritis, unspecified site: Secondary | ICD-10-CM

## 2021-07-08 DIAGNOSIS — Z1211 Encounter for screening for malignant neoplasm of colon: Secondary | ICD-10-CM

## 2021-07-21 ENCOUNTER — Ambulatory Visit: Payer: Medicaid Other | Admitting: Orthopaedic Surgery

## 2021-07-21 ENCOUNTER — Other Ambulatory Visit: Payer: Self-pay

## 2021-07-21 ENCOUNTER — Encounter: Payer: Self-pay | Admitting: Orthopaedic Surgery

## 2021-07-21 DIAGNOSIS — M25552 Pain in left hip: Secondary | ICD-10-CM

## 2021-07-21 NOTE — Progress Notes (Signed)
Office Visit Note   Patient: [name]           Date of Birth: 04-Mar-1961           MRN: [id number] Visit Date: 07/21/2021              Requested by: Mitzi Hansen, MD [address] [place],  [place] [address] PCP: Mitzi Hansen, MD   Assessment & Plan: Visit Diagnoses:  1. Pain in left hip     Plan: Quad Lupe returns today to discuss left hip MRI and lumbar spine MRI.  Musculoskeletal exams are unchanged.  She has no pain with rotation of the right hip but has mild pain with rotation of the left hip.  The MRI of the hips show mild partial thickness cartilage loss bilaterally.  Mild tendinosis of the gluteus minimus.  MRI of the lumbar spine shows a mild disc bulge at L4-5 and facet disease.  No real findings that would explain her symptoms.  Based on these findings I feel that the pain is likely coming from the mild chondrosis but due to the fact that she has fibromyalgia her pain is likely worse than typical.  She has had numerous injections to her left hip without significant relief.  I do not feel that she is at a stage where surgery is indicated.  I recommend continued conservative management for now.  We will see her back as needed.  Follow-Up Instructions: No follow-ups on file.   Orders:  No orders of the defined types were placed in this encounter.  No orders of the defined types were placed in this encounter.     Procedures: No procedures performed   Clinical Data: No additional findings.   Subjective: Chief Complaint  Patient presents with   Left Hip - Pain   Lower Back - Pain    HPI  Review of Systems   Objective: Vital Signs: LMP 07/04/2017   Physical Exam  Ortho Exam  Specialty Comments:  No specialty comments available.  Imaging: No results found.   PMFS History: Patient Active Problem List   Diagnosis Date Noted   Generalized hyperhidrosis 06/12/2021   Osteoarthritis 04/11/2021   Unintentional weight  loss 12/25/2020   Primary osteoarthritis of left knee 06/05/2020   Obesity (BMI 30-39.9) 04/02/2019   Mixed incontinence urge and stress 01/01/2019   Left hip pain 12/05/2017   Chronic midline low back pain with bilateral sciatica 01/31/2017   Restless legs syndrome (RLS) 10/24/2016   Long-term current use of opiate analgesic 03/01/2016   Fibromyalgia 05/23/2012   Healthcare maintenance 03/01/2011   Hyperlipidemia 07/27/2010   DM2 (diabetes mellitus, type 2) ([place]) 07/26/2010   Past Medical History:  Diagnosis Date   Adhesive capsulitis of right shoulder    with underlying tendinopathy rotator cuff   Arthritis    Diabetes mellitus    oral tx   Fibromyalgia    GERD (gastroesophageal reflux disease)    History of post-sterilization tuboplasty 2000   Plantar fasciitis    Right   Shortness of breath    with exertion   Sleep apnea 5 plus yrs   study -pt could not sleep test inconclusive.    Tear of meniscus of left knee    x2   Tear of meniscus of right knee     Family History  Problem Relation Age of Onset   Breast cancer Sister        both sisters have breast cancer  Past Surgical History:  Procedure Laterality Date   CARPAL TUNNEL RELEASE Bilateral    CHOLECYSTECTOMY     FOOT TENDON SURGERY     KNEE ARTHROSCOPY  10/10/2011   Procedure: ARTHROSCOPY KNEE;  Surgeon: Newt Minion, MD;  Location: [facility];  Service: Orthopedics;  Laterality: Right;  Right Knee Arthroscopy and Excision Medial Meniscus   MENISCUS REPAIR     R and L (L x2)   ROTATOR CUFF REPAIR Left    TONSILLECTOMY     TOTAL KNEE ARTHROPLASTY Left 06/06/2020   Procedure: LEFT TOTAL KNEE ARTHROPLASTY;  Surgeon: Leandrew Koyanagi, MD;  Location: [facility];  Service: Orthopedics;  Laterality: Left;   TUBAL LIGATION     Social History   Occupational History   Occupation: unemployed    Fish farm manager: UNEMPLOYED  Tobacco Use   Smoking status: Never   Smokeless tobacco: Never  Vaping Use   Vaping Use: Never used   Substance and Sexual Activity   Alcohol use: No    Alcohol/week: 0.0 standard drinks   Drug use: No   Sexual activity: Not on file

## 2021-07-24 NOTE — Telephone Encounter (Signed)
Pt unable to tolerate colonoscopy prep. She would prefer to use cologuard with the understanding that a positive test will indicate need for colonoscopy.

## 2021-07-26 ENCOUNTER — Other Ambulatory Visit: Payer: Self-pay | Admitting: Internal Medicine

## 2021-07-26 DIAGNOSIS — G2581 Restless legs syndrome: Secondary | ICD-10-CM

## 2021-07-26 DIAGNOSIS — Z794 Long term (current) use of insulin: Secondary | ICD-10-CM

## 2021-07-26 DIAGNOSIS — E113393 Type 2 diabetes mellitus with moderate nonproliferative diabetic retinopathy without macular edema, bilateral: Secondary | ICD-10-CM

## 2021-07-26 NOTE — Telephone Encounter (Signed)
Requip was discontinued 05/2021

## 2021-07-27 ENCOUNTER — Other Ambulatory Visit: Payer: Self-pay | Admitting: Dietician

## 2021-07-27 ENCOUNTER — Ambulatory Visit: Payer: Medicaid Other | Admitting: Internal Medicine

## 2021-07-27 VITALS — BP 141/72 | HR 70 | Temp 98.1°F | Wt 168.1 lb

## 2021-07-27 DIAGNOSIS — E113399 Type 2 diabetes mellitus with moderate nonproliferative diabetic retinopathy without macular edema, unspecified eye: Secondary | ICD-10-CM

## 2021-07-27 DIAGNOSIS — E785 Hyperlipidemia, unspecified: Secondary | ICD-10-CM

## 2021-07-27 DIAGNOSIS — R61 Generalized hyperhidrosis: Secondary | ICD-10-CM

## 2021-07-27 DIAGNOSIS — G2581 Restless legs syndrome: Secondary | ICD-10-CM | POA: Diagnosis not present

## 2021-07-27 DIAGNOSIS — N3946 Mixed incontinence: Secondary | ICD-10-CM

## 2021-07-27 DIAGNOSIS — Z79891 Long term (current) use of opiate analgesic: Secondary | ICD-10-CM

## 2021-07-27 DIAGNOSIS — Z794 Long term (current) use of insulin: Secondary | ICD-10-CM | POA: Diagnosis not present

## 2021-07-27 DIAGNOSIS — R634 Abnormal weight loss: Secondary | ICD-10-CM | POA: Diagnosis not present

## 2021-07-27 DIAGNOSIS — M1712 Unilateral primary osteoarthritis, left knee: Secondary | ICD-10-CM | POA: Diagnosis not present

## 2021-07-27 DIAGNOSIS — E113393 Type 2 diabetes mellitus with moderate nonproliferative diabetic retinopathy without macular edema, bilateral: Secondary | ICD-10-CM

## 2021-07-27 DIAGNOSIS — M1991 Primary osteoarthritis, unspecified site: Secondary | ICD-10-CM

## 2021-07-27 LAB — POCT GLYCOSYLATED HEMOGLOBIN (HGB A1C): Hemoglobin A1C: 8.1 % — AB (ref 4.0–5.6)

## 2021-07-27 LAB — GLUCOSE, CAPILLARY: Glucose-Capillary: 144 mg/dL — ABNORMAL HIGH (ref 70–99)

## 2021-07-27 MED ORDER — DICLOFENAC SODIUM 3 % EX GEL: 2.0000 g | Freq: Three times a day (TID) | CUTANEOUS | 4 refills | Status: DC

## 2021-07-27 NOTE — Progress Notes (Cosign Needed)
Referral per Dr. Darrick Meigs.

## 2021-07-28 ENCOUNTER — Encounter: Payer: Self-pay | Admitting: Internal Medicine

## 2021-07-28 ENCOUNTER — Telehealth: Payer: Self-pay

## 2021-07-28 LAB — BASIC METABOLIC PANEL
BUN/Creatinine Ratio: 16 (ref 9–23)
BUN: 10 mg/dL (ref 6–24)
CO2: 21 mmol/L (ref 20–29)
Calcium: 10 mg/dL (ref 8.7–10.2)
Chloride: 102 mmol/L (ref 96–106)
Creatinine, Ser: 0.61 mg/dL (ref 0.57–1.00)
Glucose: 146 mg/dL — ABNORMAL HIGH (ref 70–99)
Potassium: 4.3 mmol/L (ref 3.5–5.2)
Sodium: 144 mmol/L (ref 134–144)
eGFR: 103 mL/min/{1.73_m2} (ref >59)

## 2021-07-28 MED ORDER — CELECOXIB 100 MG PO CAPS: ORAL_CAPSULE | ORAL | 0 refills | Status: DC

## 2021-07-28 MED ORDER — OXYCODONE HCL 10 MG PO TABS: 10.0000 mg | ORAL_TABLET | Freq: Four times a day (QID) | ORAL | 0 refills | Status: AC | PRN

## 2021-07-28 MED ORDER — CYCLOBENZAPRINE HCL 10 MG PO TABS: 10.0000 mg | ORAL_TABLET | Freq: Two times a day (BID) | ORAL | 3 refills | Status: DC | PRN

## 2021-07-28 MED ORDER — EMPAGLIFLOZIN 25 MG PO TABS: 25.0000 mg | ORAL_TABLET | Freq: Every day | ORAL | 1 refills | Status: DC

## 2021-07-28 MED ORDER — ATORVASTATIN CALCIUM 20 MG PO TABS: 20.0000 mg | ORAL_TABLET | Freq: Every day | ORAL | 1 refills | Status: DC

## 2021-07-28 NOTE — Assessment & Plan Note (Addendum)
There appears to be a strong correlation between her weight loss and victoza. She was having consistent weight loss from 2019 onward until we discontinued victoza in June. After we discontinued it, she was able to gain a few pounds back. Since it was restarted in November, she has lost 5#.  Initially, it did appear strange that she had been on it for several years (since 2011) and did not have these weight loss issues until 2019. Upon a deeper chart dive however, a pattern becomes more apparent. It was marked as not taking in 2017 and there are two scripts in 2018 for it to be started with a titration, suggesting she may have not actually started taking it until 2019, which is when she began losing a large amount of weight. Then, after that, she continued to get it filled with the titration dosing, suggesting that she would have been running out of it early consistently. November 2021 was the first time that it was ordered at the 1.2mg  dosing. This timing also coincides with a dramatic drop in weight. She ultimately does need age appropriate cancer screenings however the above findings are reassuring and further support the weight loss being attributable to Victoza. We have discontinued Victoza again today and will continue to monitor her weight over the course of the next few months. If she continues to loose weight over the next 4-6 months, alternative explanations will need to be delved into.

## 2021-07-28 NOTE — Assessment & Plan Note (Addendum)
Current medications: victoza 1.6mg  daily, lantus 35U daily, jardiance 10mg  daily Reports general overall good adherence although she did forget her insulin while out of town at a family members x3d. Reports worsening symptoms again, similar to those in June, including poor appetite, weakness, weight loss. Based on her weight today, she has lost 5# since restarting victoza in November. To avoid her symptoms progressing to the extent that they did in June, we decided to discontinue victoza once again.  Denies hypoglycemia. Unfortunately, our time was limited at today's visit due to multiple phone calls she was receiving from her [place]. She typically checks her blood sugars once daily.   D/C victoza  Increase jardiance to 25mg   Due to the rise in her A1C, I have asked her to increase her glucose checks to 3x daily. We have tried placing a CGM on her multiple times in the past however it kept falling off so is not a viable option.  She will return in 1-2 weeks for me to review her log. I am suspecting that we will need to start short acting insulin. Could consider trying a DPP4.  I have placed a referral to [facility] to further assist [name]   When she returns in a couple weeks, we will discuss further workup of early satiety and consider gastric emptying study  A1C goal <7

## 2021-07-28 NOTE — Assessment & Plan Note (Signed)
Reports significant improvement since starting robinul. Continue current management.

## 2021-07-28 NOTE — Telephone Encounter (Signed)
Pt states Oxycodone HCl 10 MG TABS is not at the pharmacy. Requesting medication to be filled by today.   Please send the medication to [id number] - [place], [place].Marland Kitchen

## 2021-07-28 NOTE — Assessment & Plan Note (Signed)
Pain is reasonably controlled on current therapy with oxycodone 10mg  q6h prn #120/month. Medication improves quality of life and improves ability to perform ADLs  Continue current management  Medication contract signed at today's visit  Will obtain UDS at next office visit

## 2021-07-28 NOTE — Progress Notes (Addendum)
Office Visit   Patient ID: [name], female    DOB: 1960/11/17, 61 y.o.   MRN: [id number]   PCP: Mitzi Hansen, MD   Subjective:   [name] is a 61 y.o. year old female who presents for follow up of chronic medical conditions including diabetes and chronic pain.  LOV with me was 03/2021. Her conditions were stable at that time and no major changes were made.  Interm hx: 06/06/21: Seen in orthopedics by Dr. Erlinda Hong from chronic left hip pain--MRI ordered. 05/2021: Seen in [facility] for hyperhydrosis and was started on robinul. Additionally, she was started back on victoza.  06/24/21: MRI left hip and lumbar spine: mild left hip arthritic changes. Lumbar spine unrevealing 07/06/2021: telephone call from pt. I spoke with her and she was unable to tolerate colonoscopy prep. Cologuard ordered.  07/21/21: Orthopedics office visit. Recommended ongoing conservative management. PRN follow up.  At today's visit, she reports significant improvement in her hyperhydrosis since starting the robinul.   She has not completed Cologuard testing. She explains that her bowel movements are typically at around 2AM and that performing the steps needed to deposit the cologuard sample is less than ideal. She asked about alternative testing options, aside from colonoscopy. I explained that there really are not alternatives that don't involve either colon prep or stool samples.   She notes that the urology office that I had referred her to back in September had reported that her insurance was no longer covered there.  Reports general overall good adherence to her diabetic regimen although she did forget her insulin while out of town at a family members x3d. Reports worsening symptoms again, similar to those in June, including poor appetite, weakness, weight loss. She additionally reports that she has had a long standing history of early satiety. Prior to restarting the victoza, she had felt that her appetite  was improving. She ate several small meals throughout the day. She would occasionally have vomiting if she overate.    ACTIVE MEDICATIONS   Outpatient Medications Prior to Visit  Medication Sig   ACCU-CHEK FASTCLIX LANCETS MISC Check three times a day to check blood sugar. 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 insulin dependent   Blood Glucose Monitoring Suppl (ACCU-CHEK GUIDE ME) w/Device KIT 1 each by Does not apply route 3 (three) times daily.   cetirizine (ZYRTEC) 10 MG tablet Take 1 tablet (10 mg total) by mouth daily.   glucose blood (ACCU-CHEK GUIDE) test strip CHECK BLOOD SUGAR 3 TIMES DAILY   Insulin Pen Needle (PEN NEEDLES) 32G X 4 MM MISC 1 each by Does not apply route 5 (five) times daily.   pantoprazole (PROTONIX) 40 MG tablet Take 1 tablet (40 mg total) by mouth daily.   [DISCONTINUED] atorvastatin (LIPITOR) 20 MG tablet Take 1 tablet (20 mg total) by mouth daily.   [DISCONTINUED] celecoxib (CELEBREX) 100 MG capsule TAKE 1 CAPSULE BY MOUTH EVERY DAY   [DISCONTINUED] cyclobenzaprine (FLEXERIL) 10 MG tablet Take 1 tablet (10 mg total) by mouth 2 (two) times daily as needed for muscle spasms.   [DISCONTINUED] Diclofenac Sodium 3 % GEL Apply 2 g topically 3 (three) times daily.   [DISCONTINUED] empagliflozin (JARDIANCE) 10 MG TABS tablet Take 1 tablet (10 mg total) by mouth daily before breakfast.   [DISCONTINUED] glycopyrrolate (ROBINUL) 1 MG tablet TAKE 1 TABLET BY MOUTH TWICE A DAY   [DISCONTINUED] insulin glargine (LANTUS SOLOSTAR) 100 UNIT/ML Solostar Pen Inject 15 Units into the skin daily.   [DISCONTINUED] liraglutide ([place])  18 MG/3ML SOPN Inject 0.6 mg into the skin daily. Inject 0.6 mg daily for 1 week. Afterwards, increase to 1.2 mg daily.   [DISCONTINUED] Oxycodone HCl 10 MG TABS Take 1 tablet (10 mg total) by mouth every 6 (six) hours as needed.   No facility-administered medications prior to visit.     Objective:   BP (!) 141/72 (BP Location: Right Arm, Patient Position: Sitting, Cuff  Size: Normal)    Pulse 70    Temp 98.1 F (36.7 C) (Oral)    Wt 168 lb 1.6 oz (76.2 kg) Comment: with 3lbs boots   LMP 07/04/2017    SpO2 99%    BMI 30.75 kg/m  Wt Readings from Last 3 Encounters:  07/27/21 168 lb 1.6 oz (76.2 kg)  06/12/21 173 lb 6.4 oz (78.7 kg)  04/10/21 174 lb 14.4 oz (79.3 kg)    BP Readings from Last 3 Encounters:  07/27/21 (!) 141/72  06/12/21 (!) 164/83  04/10/21 125/67   General: well appearing, no distress Cardiac: RRR Pulm: lungs clear throughout MSK: analgesic gait  Assessment & Plan:   Problem List Items Addressed This Visit       Endocrine   DM2 (diabetes mellitus, type 2) ([place]) - Primary (Chronic)    Current medications: victoza 1.84m daily, lantus 35U daily, jardiance 136mdaily Reports general overall good adherence although she did forget her insulin while out of town at a family members x3d. Reports worsening symptoms again, similar to those in June, including poor appetite, weakness, weight loss. Based on her weight today, she has lost 5# since restarting victoza in November. To avoid her symptoms progressing to the extent that they did in June, we decided to discontinue victoza once again.  Denies hypoglycemia. Unfortunately, our time was limited at today's visit due to multiple phone calls she was [place] typically checks her blood sugars once daily.  D/C victoza Increase jardiance to 2563mue to the rise in her A1C, I have asked her to increase her glucose checks to 3x daily. We have tried placing a CGM on her multiple times in the past however it kept falling off so is not a viable option. She will return in 1-2 weeks for me to review her log. I am suspecting that we will need to start short acting insulin. Could consider trying a DPP4. I have placed a referral to [facility] further assist us [place] she returns in a couple weeks, we will discuss further workup of early satiety and consider gastric emptying  study A1C goal <7        Generalized hyperhidrosis    Reports significant improvement since starting robinul. Continue current management.       Long-term current use of opiate analgesic for management of osteoarthritis and fibromyalgia (Chronic)    Pain is reasonably controlled on current therapy with oxycodone 19m42mh prn #120/month. Medication improves quality of life and improves ability to perform ADLs Continue current management Medication contract signed at today's visit Will obtain UDS at next office visit      Weight loss    There appears to be a strong correlation between her weight loss and victoza. She was having consistent weight loss from 2019 onward until we discontinued victoza in June. After we discontinued it, she was able to gain a few pounds back. Since it was restarted in November, she has lost 5#.  Initially, it did appear strange that she had been on it for  several years (since 2011) and did not have these weight loss issues until 2019. Upon a deeper chart dive however, a pattern becomes more apparent. It was marked as not taking in 2017 and there are two scripts in 2018 for it to be started with a titration, suggesting she may have not actually started taking it until 2019, which is when she began losing a large amount of weight. Then, after that, she continued to get it filled with the titration dosing, suggesting that she would have been running out of it early consistently. November 2021 was the first time that it was ordered at the 1.69m dosing. This timing also coincides with a dramatic drop in weight. She ultimately does need age appropriate cancer screenings however the above findings are reassuring and further support the weight loss being attributable to Victoza. We have discontinued Victoza again today and will continue to monitor her weight over the course of the next few months. If she continues to loose weight over the next 4-6 months, alternative explanations  will need to be delved into.      Mixed incontinence           Oxybutinin had to be discontinued in June due to persistent orthostasis. Alternative    medications were not covered by insurance. Since discontinuing the oxybutinin, orthostasis has resolved. Unfortunately however, she has continued to struggle with urinary incontinence. We had referred her to urology in June however she has not yet been seen by them. She had to cancel an initial office visit due to the death of a family member. She called them back when she had returned however she was told that medicaid no longer covers their service. She still wishes to proceed with urology evaluation and urodynamic testing.  --I have placed another urology referral today  She will follow up with me in 1-2 weeks for review of glucose log and pap smear.   Pt discussed with Dr. MMarcille Buffy MD Internal Medicine Resident PGY-3 [facility] Res[facility] 07/28/2021 4:55 PM

## 2021-07-28 NOTE — Telephone Encounter (Signed)
I called [facility] stated it was filled on 1/12 and picked up on the 12th and paid $37.88.  I called pt - stated she nor anyone else has picked up Oxycodone on the 12th.  I called the pharmacy back - talked to a different person who stated they did not received the rx yesterday from Dr Darrick Meigs. Pt informed.  Please re-send rx.

## 2021-08-01 NOTE — Telephone Encounter (Signed)
Oxycodone rx was sent again per Dr Darrick Meigs on 07/28/21.

## 2021-08-02 ENCOUNTER — Encounter: Payer: Medicaid Other | Admitting: Internal Medicine

## 2021-08-02 NOTE — Progress Notes (Signed)
Internal Medicine Clinic Attending  Case discussed with Dr. Christian at the time of the visit.  We reviewed the resident's history and exam and pertinent patient test results.  I agree with the assessment, diagnosis, and plan of care documented in the resident's note.    

## 2021-08-03 ENCOUNTER — Telehealth: Payer: Self-pay | Admitting: Internal Medicine

## 2021-08-03 NOTE — Telephone Encounter (Signed)
Rec'd a call from the Referral [facility] in regards to the patient's [place] 07/27/2021.  Per their office no explanation  as to why the pt needs to be seen.  Per their office after reviewing the OV notes the visit only discussed  diabetes.  Please advise if this note can be Addend for an appt. Also this pt was referred back on 12/2020 and cancelled and no showed 4 appointments already with their facility.

## 2021-08-04 NOTE — Assessment & Plan Note (Signed)
Oxybutinin had to be discontinued in June due to persistent orthostasis. Alternative medications were not covered by insurance. Since discontinuing the oxybutinin, orthostasis has resolved. Unfortunately however, she has continued to struggle with urinary incontinence. We had referred her to urology in June however she has not yet been seen by them. She had to cancel an initial office visit due to the death of a family member. She called them back when she had returned however she was told that medicaid no longer covers their service. She still wishes to proceed with urology evaluation and urodynamic testing.  --I have placed another urology referral today

## 2021-08-04 NOTE — Telephone Encounter (Signed)
Ok, I'll add it to my last note. Thanks Chilon.

## 2021-08-05 LAB — COLOGUARD: COLOGUARD: NEGATIVE

## 2021-08-09 ENCOUNTER — Encounter: Payer: Medicaid Other | Admitting: Internal Medicine

## 2021-08-09 ENCOUNTER — Encounter: Payer: Medicaid Other | Admitting: Dietician

## 2021-08-10 ENCOUNTER — Encounter: Payer: Medicaid Other | Admitting: Internal Medicine

## 2021-08-10 ENCOUNTER — Encounter: Payer: Medicaid Other | Admitting: Dietician

## 2021-09-01 ENCOUNTER — Other Ambulatory Visit: Payer: Self-pay | Admitting: Internal Medicine

## 2021-09-11 ENCOUNTER — Ambulatory Visit (INDEPENDENT_AMBULATORY_CARE_PROVIDER_SITE_OTHER): Payer: Medicaid Other

## 2021-09-11 ENCOUNTER — Ambulatory Visit: Payer: Medicaid Other | Admitting: Podiatry

## 2021-09-11 ENCOUNTER — Other Ambulatory Visit: Payer: Self-pay

## 2021-09-11 DIAGNOSIS — M79671 Pain in right foot: Secondary | ICD-10-CM | POA: Diagnosis not present

## 2021-09-11 DIAGNOSIS — M79672 Pain in left foot: Secondary | ICD-10-CM

## 2021-09-11 DIAGNOSIS — M722 Plantar fascial fibromatosis: Secondary | ICD-10-CM | POA: Diagnosis not present

## 2021-09-11 NOTE — Patient Instructions (Addendum)
I have ordered a medication for you that will come from [facility] in [place]. They should be calling you to verify insurance and will mail the medication to you. If you live close by then you can go by their pharmacy to pick up the medication. Their phone number is [phone]. If you do not hear from them in the next few days, please give us a call at [phone].   Plantar Fasciitis (Heel Spur Syndrome) with Rehab The plantar fascia is a fibrous, ligament-like, soft-tissue structure that spans the bottom of the foot. Plantar fasciitis is a condition that causes pain in the foot due to inflammation of the tissue. SYMPTOMS  Pain and tenderness on the underneath side of the foot. Pain that worsens with standing or walking. CAUSES  Plantar fasciitis is caused by irritation and injury to the plantar fascia on the underneath side of the foot. Common mechanisms of injury include: Direct trauma to bottom of the foot. Damage to a small nerve that runs under the foot where the main fascia attaches to the heel bone. Stress placed on the plantar fascia due to bone spurs. RISK INCREASES WITH:  Activities that place stress on the plantar fascia (running, jumping, pivoting, or cutting). Poor strength and flexibility. Improperly fitted shoes. Tight calf muscles. Flat feet. Failure to warm-up properly before activity. Obesity. PREVENTION Warm up and stretch properly before activity. Allow for adequate recovery between workouts. Maintain physical fitness: Strength, flexibility, and endurance. Cardiovascular fitness. Maintain a health body weight. Avoid stress on the plantar fascia. Wear properly fitted shoes, including arch supports for individuals who have flat feet.  PROGNOSIS  If treated properly, then the symptoms of plantar fasciitis usually resolve without surgery. However, occasionally surgery is necessary.  RELATED COMPLICATIONS  Recurrent symptoms that may result in a  chronic condition. Problems of the lower back that are caused by compensating for the injury, such as limping. Pain or weakness of the foot during push-off following surgery. Chronic inflammation, scarring, and partial or complete fascia tear, occurring more often from repeated injections.  TREATMENT  Treatment initially involves the use of ice and medication to help reduce pain and inflammation. The use of strengthening and stretching exercises may help reduce pain with activity, especially stretches of the Achilles tendon. These exercises may be performed at home or with a therapist. Your caregiver may recommend that you use heel cups of arch supports to help reduce stress on the plantar fascia. Occasionally, corticosteroid injections are given to reduce inflammation. If symptoms persist for greater than 6 months despite non-surgical (conservative), then surgery may be recommended.   MEDICATION  If pain medication is necessary, then nonsteroidal anti-inflammatory medications, such as aspirin and ibuprofen, or other minor pain relievers, such as acetaminophen, are often recommended. Do not take pain medication within 7 days before surgery. Prescription pain relievers may be given if deemed necessary by your caregiver. Use only as directed and only as much as you need. Corticosteroid injections may be given by your caregiver. These injections should be reserved for the most serious cases, because they may only be given a certain number of times.  HEAT AND COLD Cold treatment (icing) relieves pain and reduces inflammation. Cold treatment should be applied for 10 to 15 minutes every 2 to 3 hours for inflammation and pain and immediately after any activity that aggravates your symptoms. Use ice packs or massage the area with a piece of ice (ice massage). Heat treatment may be used prior to performing the stretching   and strengthening activities prescribed by your caregiver, physical therapist, or  athletic trainer. Use a heat pack or soak the injury in warm water.  SEEK IMMEDIATE MEDICAL CARE IF: Treatment seems to offer no benefit, or the condition worsens. Any medications produce adverse side effects.  EXERCISES- RANGE OF MOTION (ROM) AND STRETCHING EXERCISES - Plantar Fasciitis (Heel Spur Syndrome) These exercises may help you when beginning to rehabilitate your injury. Your symptoms may resolve with or without further involvement from your physician, physical therapist or athletic trainer. While completing these exercises, remember:  Restoring tissue flexibility helps normal motion to return to the joints. This allows healthier, less painful movement and activity. An effective stretch should be held for at least 30 seconds. A stretch should never be painful. You should only feel a gentle lengthening or release in the stretched tissue.  RANGE OF MOTION - Toe Extension, Flexion Sit with your right / left leg crossed over your opposite knee. Grasp your toes and gently pull them back toward the top of your foot. You should feel a stretch on the bottom of your toes and/or foot. Hold this stretch for 10 seconds. Now, gently pull your toes toward the bottom of your foot. You should feel a stretch on the top of your toes and or foot. Hold this stretch for 10 seconds. Repeat  times. Complete this stretch 3 times per day.   RANGE OF MOTION - Ankle Dorsiflexion, Active Assisted Remove shoes and sit on a chair that is preferably not on a carpeted surface. Place right / left foot under knee. Extend your opposite leg for support. Keeping your heel down, slide your right / left foot back toward the chair until you feel a stretch at your ankle or calf. If you do not feel a stretch, slide your bottom forward to the edge of the chair, while still keeping your heel down. Hold this stretch for 10 seconds. Repeat 3 times. Complete this stretch 2 times per day.   STRETCH  Gastroc, Standing Place  hands on wall. Extend right / left leg, keeping the front knee somewhat bent. Slightly point your toes inward on your back foot. Keeping your right / left heel on the floor and your knee straight, shift your weight toward the wall, not allowing your back to arch. You should feel a gentle stretch in the right / left calf. Hold this position for 10 seconds. Repeat 3 times. Complete this stretch 2 times per day.  STRETCH  Soleus, Standing Place hands on wall. Extend right / left leg, keeping the other knee somewhat bent. Slightly point your toes inward on your back foot. Keep your right / left heel on the floor, bend your back knee, and slightly shift your weight over the back leg so that you feel a gentle stretch deep in your back calf. Hold this position for 10 seconds. Repeat 3 times. Complete this stretch 2 times per day.  STRETCH  Gastrocsoleus, Standing  Note: This exercise can place a lot of stress on your foot and ankle. Please complete this exercise only if specifically instructed by your caregiver.  Place the ball of your right / left foot on a step, keeping your other foot firmly on the same step. Hold on to the wall or a rail for balance. Slowly lift your other foot, allowing your body weight to press your heel down over the edge of the step. You should feel a stretch in your right / left calf. Hold this position for   10 seconds. Repeat this exercise with a slight bend in your right / left knee. Repeat 3 times. Complete this stretch 2 times per day.   STRENGTHENING EXERCISES - Plantar Fasciitis (Heel Spur Syndrome)  These exercises may help you when beginning to rehabilitate your injury. They may resolve your symptoms with or without further involvement from your physician, physical therapist or athletic trainer. While completing these exercises, remember:  Muscles can gain both the endurance and the strength needed for everyday activities through controlled exercises. Complete  these exercises as instructed by your physician, physical therapist or athletic trainer. Progress the resistance and repetitions only as guided.  STRENGTH - Towel Curls Sit in a chair positioned on a non-carpeted surface. Place your foot on a towel, keeping your heel on the floor. Pull the towel toward your heel by only curling your toes. Keep your heel on the floor. Repeat 3 times. Complete this exercise 2 times per day.  STRENGTH - Ankle Inversion Secure one end of a rubber exercise band/tubing to a fixed object (table, pole). Loop the other end around your foot just before your toes. Place your fists between your knees. This will focus your strengthening at your ankle. Slowly, pull your big toe up and in, making sure the band/tubing is positioned to resist the entire motion. Hold this position for 10 seconds. Have your muscles resist the band/tubing as it slowly pulls your foot back to the starting position. Repeat 3 times. Complete this exercises 2 times per day.  Document Released: 07/02/2005 Document Revised: 09/24/2011 Document Reviewed: 10/14/2008 ExitCare Patient Information 2014 [employer].  

## 2021-09-12 ENCOUNTER — Telehealth: Payer: Self-pay | Admitting: Internal Medicine

## 2021-09-12 ENCOUNTER — Encounter: Payer: Medicaid Other | Admitting: Internal Medicine

## 2021-09-12 NOTE — Telephone Encounter (Signed)
RTC to patient offered an appointment for today or tomorrow.  Patient scheduled for 9:15 AM on tomorrow morning.

## 2021-09-12 NOTE — Telephone Encounter (Signed)
RTC to patient stated had a fever last night -did not take temp.  Sweats , Chills and runny nose.  Has fever blisters that are spreading.  Has tried Carmex and something else OTC for fever blisters.  No c/o sore throat or cough.  Did have contact with her sister who had the same thing.  Did not know what she had.  Tossed and turned a lot last night per her husband.  Wants to know what she should do about the fever blisters.

## 2021-09-12 NOTE — Telephone Encounter (Signed)
Pt reporting fever blisters on both sides of her mouth. Patient reports a fever last night, hot, sweating, and now has chills.  Please call back.

## 2021-09-13 ENCOUNTER — Encounter: Payer: Self-pay | Admitting: Internal Medicine

## 2021-09-13 ENCOUNTER — Encounter: Payer: Medicaid Other | Admitting: Internal Medicine

## 2021-09-13 NOTE — Progress Notes (Deleted)
? ?  CC: blisters ? ?HPI:[name] is a 61 y.o. female who presents for evaluation of ***. Please see individual problem based A/P for details. ? ?Blister - subjective fever, chills, runyn nose. Tried carmex and OTC for fevers/blister. No sore throat or cough. Did have contact with sister who had same thing, but does not know what it was.  ?- any new meds or food? ?- hurt,, itch, burst? ?- localised or generalized? - Blisters both sides of mouth. ?- ever had anything like this before? ?*** HIV, HSV? ? ?DM2 ? ? ?Depression, PHQ-9: ?Based on the patients  ?[place] Office Visit from 06/12/2021 in [place]  ?PHQ-9 Total Score 9  ? ?  ? score we have ***. ? ?Past Medical History:  ?Diagnosis Date  ? Adhesive capsulitis of right shoulder   ? with underlying tendinopathy rotator cuff  ? Arthritis   ? Diabetes mellitus   ? oral tx  ? Fibromyalgia   ? GERD (gastroesophageal reflux disease)   ? History of post-sterilization tuboplasty 2000  ? Plantar fasciitis   ? Right  ? Shortness of breath   ? with exertion  ? Sleep apnea 5 plus yrs  ? study -pt could not sleep test inconclusive.   ? Tear of meniscus of left knee   ? x2  ? Tear of meniscus of right knee   ? ?Review of Systems:   ?ROS  ? ?Physical Exam: ?There were no vitals filed for this visit. ? ? ?General: *** ?HEENT: Conjunctiva nl , antiicteric sclerae, moist mucous membranes, no exudate or erythema ?Cardiovascular: Normal rate, regular rhythm.  No murmurs, rubs, or gallops ?Pulmonary : Equal breath sounds, No wheezes, rales, or rhonchi ?Abdominal: soft, nontender,  bowel sounds present ?Ext: No edema in lower extremities, no tenderness to palpation of lower extremities.  ? ?Assessment & Plan:  ? ?See Encounters Tab for problem based charting. ? ?Patient {GC/GE:[id number]::"discussed with","seen with"} Dr. {XAJOI:[id number]::"C. Hoffman","Guilloud","Mullen","Narendra","Raines","Vincent","Williams"} ? ?

## 2021-09-17 NOTE — Progress Notes (Signed)
Subjective: 61 year old female presents the office today for concerns of left foot pain describes as sharp sensation with walking as well as needle sensation bottom of her foot.  She was on the medial aspect where she gets the majority discomfort.  No recent injury or trauma.  No other concerns.  Objective: AAO x3, NAD DP/PT pulses palpable bilaterally, CRT less than 3 seconds There is tenderness palpation along the medial band of plantar fascia and there is reoccurrence of the plantar fibroma, soft tissue mass noted.  There is no edema, erythema.  There is no open lesions.  No area pinpoint tenderness.  MMT 5/5. No pain with calf compression, swelling, warmth, erythema  Assessment: Plantar fibromatosis  Plan: -All treatment options discussed with the patient including all alternatives, risks, complications.  -X-rays obtained reviewed.  No subacute fracture, calcifications. -Discussed conservative and surgical options.  Discussed steroid injection we held off on this.  I will order compound cream through [facility] for scarring to include verapamil.  Stretch exercises well supportive shoe. -Patient encouraged to call the office with any questions, concerns, change in symptoms.   Trula Slade DPM

## 2021-09-19 ENCOUNTER — Encounter: Payer: Medicaid Other | Admitting: Internal Medicine

## 2021-09-20 ENCOUNTER — Telehealth: Payer: Self-pay | Admitting: *Deleted

## 2021-09-20 ENCOUNTER — Other Ambulatory Visit: Payer: Self-pay | Admitting: Internal Medicine

## 2021-09-20 DIAGNOSIS — E113393 Type 2 diabetes mellitus with moderate nonproliferative diabetic retinopathy without macular edema, bilateral: Secondary | ICD-10-CM

## 2021-09-20 MED ORDER — OXYCODONE HCL 10 MG PO TABS
10.0000 mg | ORAL_TABLET | Freq: Four times a day (QID) | ORAL | 0 refills | Status: DC | PRN
Start: 2021-09-20 — End: 2021-10-24

## 2021-09-20 NOTE — Telephone Encounter (Signed)
Call to patient states she takes 20-35 units a daily of the Lantus. ?

## 2021-09-20 NOTE — Telephone Encounter (Signed)
-----   Message from Mitzi Hansen, MD sent at 09/20/2021  3:18 PM EST ----- ?Can you call her to see how much lantus she is currently taking? Need to know so I order the correct amount of insulin for her. Thanks ? ?

## 2021-09-20 NOTE — Telephone Encounter (Signed)
Patient also wants refill on Oxycodone.    ?

## 2021-09-20 NOTE — Telephone Encounter (Signed)
Refill Request ? ? ?Oxycodone HCl 10 MG TABS  ? ?[id number] [place] [facility] [address] (Ph: [phone] ? ?Insulin Glargine Solostar (LANTUS) 100 UNIT/ML Solostar Pen ?

## 2021-09-21 ENCOUNTER — Encounter: Payer: Self-pay | Admitting: Internal Medicine

## 2021-09-21 ENCOUNTER — Telehealth: Payer: Self-pay

## 2021-09-21 ENCOUNTER — Telehealth: Payer: Self-pay | Admitting: *Deleted

## 2021-09-21 ENCOUNTER — Other Ambulatory Visit: Payer: Self-pay | Admitting: Internal Medicine

## 2021-09-21 DIAGNOSIS — Z794 Long term (current) use of insulin: Secondary | ICD-10-CM

## 2021-09-21 MED ORDER — INSULIN GLARGINE SOLOSTAR 100 UNIT/ML ~~LOC~~ SOPN: PEN_INJECTOR | SUBCUTANEOUS | 1 refills | Status: DC

## 2021-09-21 NOTE — Telephone Encounter (Signed)
No Lantus samples are available.   Clinic does have [place] which according to Butch Penny is interchangable.  This is an option.    Also patient according to her Insurance should not be out of the Insulin at this point even with the change in dosing.   ?

## 2021-09-21 NOTE — Telephone Encounter (Signed)
Pa for pt ( lantus solostar)  came through on cover my meds was submitted with office notes and labs from 1/12.Marland Kitchen awaiting approval or denial  ?

## 2021-09-21 NOTE — Telephone Encounter (Signed)
RTC from patient problems with getting her Lantus Insulin. Patient stated that [employer] will not pay for another refill until April.  Is out of Insulin . States was a change in the dosing and Insulin does not last as long.  Wants to know what she can do. ?

## 2021-09-21 NOTE — Telephone Encounter (Addendum)
Call to Pharmacy patient picked up a 100 day supply of the Lantus in January.  With the new dosing the Insurance feels that she should not be out of the Lantus until 10/03/2021. ?

## 2021-09-21 NOTE — Telephone Encounter (Signed)
RTC to patient .  Questions about Insulin.  Spoke to Pharmacy with her Insurance she will not be able to get a refill until 10/03/2021.  ?

## 2021-09-21 NOTE — Telephone Encounter (Signed)
Pt is requesting a call back she did not say why .. I do know that she needs a PA on her lantus and I am working on it now  ?

## 2021-09-21 NOTE — Telephone Encounter (Signed)
DECISION : ? ? ? ?Approved today ? ? ?PA Case: [id number], Status: Approved, ? ? Coverage Starts on: 09/21/2021 12:00:00 AM, Coverage Ends on: 09/21/2022 12:00:00 AM. ? ?Drug ? ?Lantus SoloStar 100UNIT/ML pen-injectors ? ?Form ? ?[employer] Electronic [place] Form [phone] NCPDP) ?Original Claim ? ? ? ? (  PT NOTIFIED VIA VMAIL COPY SENT TO THE PHARMACY ALSO )  ?

## 2021-09-21 NOTE — Telephone Encounter (Signed)
Message from Dr. Darrick Meigs ok for patient to pick up samples of the [place].  Patient was called and informed of and will come between 9 :00-12 :00 on tomorrow. ?

## 2021-09-30 ENCOUNTER — Other Ambulatory Visit: Payer: Self-pay | Admitting: Internal Medicine

## 2021-10-24 ENCOUNTER — Other Ambulatory Visit: Payer: Self-pay

## 2021-10-24 MED ORDER — OXYCODONE HCL 10 MG PO TABS: 10.0000 mg | ORAL_TABLET | Freq: Four times a day (QID) | ORAL | 0 refills | Status: DC | PRN

## 2021-10-24 NOTE — Telephone Encounter (Signed)
oxyCODONE 10 MG TABS, [facility]FILL [facility]QUEST [id number] [place] [facility] [address] ?

## 2021-10-24 NOTE — Telephone Encounter (Signed)
Please arrange follow up with me at my next available.

## 2021-11-04 ENCOUNTER — Other Ambulatory Visit: Payer: Self-pay | Admitting: Internal Medicine

## 2021-11-04 DIAGNOSIS — R61 Generalized hyperhidrosis: Secondary | ICD-10-CM

## 2021-11-06 ENCOUNTER — Other Ambulatory Visit: Payer: Self-pay

## 2021-11-06 DIAGNOSIS — E119 Type 2 diabetes mellitus without complications: Secondary | ICD-10-CM

## 2021-11-06 MED ORDER — ACCU-CHEK GUIDE ME W/DEVICE KIT: 1.0000 | PACK | Freq: Three times a day (TID) | 0 refills | Status: DC

## 2021-11-20 ENCOUNTER — Other Ambulatory Visit (HOSPITAL_COMMUNITY)
Admission: RE | Admit: 2021-11-20 | Discharge: 2021-11-20 | Disposition: A | Discharge status: Home / Self Care | Payer: Medicaid Other | Source: Ambulatory Visit | Attending: Internal Medicine | Admitting: Internal Medicine

## 2021-11-20 ENCOUNTER — Other Ambulatory Visit: Payer: Self-pay

## 2021-11-20 ENCOUNTER — Ambulatory Visit: Payer: Medicaid Other | Admitting: Internal Medicine

## 2021-11-20 ENCOUNTER — Telehealth: Payer: Self-pay | Admitting: *Deleted

## 2021-11-20 ENCOUNTER — Encounter: Payer: Self-pay | Admitting: Internal Medicine

## 2021-11-20 VITALS — BP 120/57 | HR 58 | Temp 97.6°F | Ht 62.0 in | Wt 160.4 lb

## 2021-11-20 DIAGNOSIS — Z124 Encounter for screening for malignant neoplasm of cervix: Secondary | ICD-10-CM | POA: Diagnosis present

## 2021-11-20 DIAGNOSIS — R6881 Early satiety: Secondary | ICD-10-CM

## 2021-11-20 DIAGNOSIS — M6259 Muscle wasting and atrophy, not elsewhere classified, multiple sites: Secondary | ICD-10-CM | POA: Diagnosis not present

## 2021-11-20 DIAGNOSIS — Z1231 Encounter for screening mammogram for malignant neoplasm of breast: Secondary | ICD-10-CM

## 2021-11-20 DIAGNOSIS — R634 Abnormal weight loss: Secondary | ICD-10-CM | POA: Diagnosis not present

## 2021-11-20 DIAGNOSIS — E113399 Type 2 diabetes mellitus with moderate nonproliferative diabetic retinopathy without macular edema, unspecified eye: Secondary | ICD-10-CM

## 2021-11-20 LAB — POCT GLYCOSYLATED HEMOGLOBIN (HGB A1C): Hemoglobin A1C: 9.3 % — AB (ref 4.0–5.6)

## 2021-11-20 LAB — GLUCOSE, CAPILLARY: Glucose-Capillary: 244 mg/dL — ABNORMAL HIGH (ref 70–99)

## 2021-11-20 NOTE — Telephone Encounter (Signed)
Patient called in to report older brother was dx with myositis at age 60. He had symptoms of weight loss and muscle deterioration. ?

## 2021-11-20 NOTE — Telephone Encounter (Signed)
Returned call to pt and updated on lab add-ons based on her information. See note from today for further details.

## 2021-11-20 NOTE — Patient Instructions (Signed)
[name], it was a pleasure seeing you today! ? ?Today we discussed: ? ?Weight loss: Please be sure to complete all of the following workup ?-Colonoscopy, possible upper endoscopy ?-Mammogram ?-Gastric emptying study ?-I will call you with the results of your lab work from today ? ? ? ?Follow-up: 3 months  ? ?Please make sure to arrive 15 minutes prior to your next appointment. If you arrive late, you may be asked to reschedule.  ? ?We look forward to seeing you next time. Please call our clinic at [phone] if you have any questions or concerns. The best time to call is Monday-Friday from 9am-4pm, but there is someone available 24/7. If after hours or the weekend, call the main hospital number and ask for the Internal Medicine Resident On-Call. If you need medication refills, please notify your pharmacy one week in advance and they will send [name] a request. ? ?Thank you for letting us take part in your care. Wishing you the best! ? ? ?

## 2021-11-21 ENCOUNTER — Encounter: Payer: Self-pay | Admitting: Internal Medicine

## 2021-11-21 ENCOUNTER — Telehealth: Payer: Self-pay | Admitting: Internal Medicine

## 2021-11-21 LAB — CYTOLOGY - PAP
Comment: NEGATIVE
Diagnosis: NEGATIVE
High risk HPV: NEGATIVE

## 2021-11-21 LAB — HIV ANTIBODY (ROUTINE TESTING W REFLEX): HIV Screen 4th Generation wRfx: NONREACTIVE

## 2021-11-21 LAB — CMP14 + ANION GAP
ALT: 25 IU/L (ref 0–32)
AST: 32 IU/L (ref 0–40)
Albumin/Globulin Ratio: 1.7 (ref 1.2–2.2)
Albumin: 4.3 g/dL (ref 3.8–4.9)
Alkaline Phosphatase: 140 IU/L — ABNORMAL HIGH (ref 44–121)
Anion Gap: 17 mmol/L (ref 10.0–18.0)
BUN/Creatinine Ratio: 13 (ref 9–23)
BUN: 9 mg/dL (ref 6–24)
Bilirubin Total: 0.3 mg/dL (ref 0.0–1.2)
CO2: 22 mmol/L (ref 20–29)
Calcium: 9.5 mg/dL (ref 8.7–10.2)
Chloride: 101 mmol/L (ref 96–106)
Creatinine, Ser: 0.7 mg/dL (ref 0.57–1.00)
Globulin, Total: 2.6 g/dL (ref 1.5–4.5)
Glucose: 237 mg/dL — ABNORMAL HIGH (ref 70–99)
Potassium: 3.8 mmol/L (ref 3.5–5.2)
Sodium: 140 mmol/L (ref 134–144)
Total Protein: 6.9 g/dL (ref 6.0–8.5)
eGFR: 100 mL/min/{1.73_m2} (ref >59)

## 2021-11-21 LAB — CBC WITH DIFFERENTIAL/PLATELET
Basophils Absolute: 0 10*3/uL (ref 0.0–0.2)
Basos: 0 %
EOS (ABSOLUTE): 0.1 10*3/uL (ref 0.0–0.4)
Eos: 1 %
Hematocrit: 47.8 % — ABNORMAL HIGH (ref 34.0–46.6)
Hemoglobin: 16 g/dL — ABNORMAL HIGH (ref 11.1–15.9)
Immature Grans (Abs): 0 10*3/uL (ref 0.0–0.1)
Immature Granulocytes: 0 %
Lymphocytes Absolute: 3.1 10*3/uL (ref 0.7–3.1)
Lymphs: 35 %
MCH: 29.8 pg (ref 26.6–33.0)
MCHC: 33.5 g/dL (ref 31.5–35.7)
MCV: 89 fL (ref 79–97)
Monocytes Absolute: 0.3 10*3/uL (ref 0.1–0.9)
Monocytes: 4 %
Neutrophils Absolute: 5.4 10*3/uL (ref 1.4–7.0)
Neutrophils: 60 %
Platelets: 289 10*3/uL (ref 150–450)
RBC: 5.37 x10E6/uL — ABNORMAL HIGH (ref 3.77–5.28)
RDW: 11.9 % (ref 11.7–15.4)
WBC: 8.9 10*3/uL (ref 3.4–10.8)

## 2021-11-21 LAB — MICROALBUMIN / CREATININE URINE RATIO
Creatinine, Urine: 63.7 mg/dL
Microalb/Creat Ratio: <5 mg/g creat (ref 0–29)
Microalbumin, Urine: <3 ug/mL

## 2021-11-21 LAB — SEDIMENTATION RATE: Sed Rate: 10 mm/hr (ref 0–40)

## 2021-11-21 LAB — TSH: TSH: 0.605 u[IU]/mL (ref 0.450–4.500)

## 2021-11-21 MED ORDER — OXYCODONE HCL 10 MG PO TABS: 10.0000 mg | ORAL_TABLET | Freq: Four times a day (QID) | ORAL | 0 refills | Status: DC | PRN

## 2021-11-21 NOTE — Telephone Encounter (Signed)
Ok, good information to know. Thank you for relaying this.

## 2021-11-21 NOTE — Assessment & Plan Note (Signed)
Current medications:  lantus 35U daily, jardiance '25mg'$  daily ?A1C is continuing to increase-- 8.1 in Jan>>9.3 today. She reports that her husband is continuing to run out of insulin early, resulting him using hers since they do not separate their vials. She was out of lantus for about a month. I encouraged separation of their insulin to ensure this does not continue to happen however she did not seem invested in this concept.  She is aware that if she or her husband run out of insulin, they need to call the office.  ?She does not take a consistent amount of insulin despite prior conversations regards this.  ?She reports occassional hypoglycemia in the morning, sometimes it is normal. I have requested she record her sugars and bring them with to her visits multiple times in the past however she has not done so. We have tried CGM placement in the past however it continued to fall off on multiple occassions.  ? ?? Unfortunately, I do not think we should make any adjustments to her insulin today since her doses are varying and she is having hypoglycemia intermittently. I am hesitent to start short acting insulin with her not being able to record her home glucoses for me to review and not being adherent with consistent dosing of lantus. My concern is that she would have far more hypoglycemic episodes. I think the best thing we can do right now is ensure she is taking the lantus consistently and continue to encourage her to separate her insulin from her husband's. Her diabetes has been well controlled on this in the past so I suspect that it will improve again with better adherence. ?? A1C goal <7 ?

## 2021-11-21 NOTE — Telephone Encounter (Signed)
Call from pt - BP 112/45; stated she drank about 64 oz of water. She stated the BP machine is old and it was her mother. Appt schedule tomorrow 5/10 with PCP @ 1445PM - pt instructed to being the BP machine with her. ?

## 2021-11-21 NOTE — Progress Notes (Signed)
Internal Medicine Clinic Attending ? ?Case discussed with Dr. Darrick Meigs  At the time of the visit.  We reviewed the resident?s history and exam and pertinent patient test results.  I agree with the assessment, diagnosis, and plan of care documented in the resident?s note.  ? ?I share Dr. Chase Picket concerns about ongoing weight loss despite being off of GLP-1 now. Agree with plans for age appropriate cancer screening & gastric emptying study. ? ?

## 2021-11-21 NOTE — Telephone Encounter (Signed)
Pt calling back with her BP Readings. ? ?80/62  sitting down ?126/82 standing up ? ?Pt could not get the reading why she was laying down as the machine kept reading error. ? ? ?Pt requesting her pain medication as well ? ?Oxycodone HCl 10 MG TABS ?[id number] [place]?

## 2021-11-21 NOTE — Assessment & Plan Note (Addendum)
Ongoing weight loss was the primary focus of her appointment today. Previously seemed related to [place]. Victoza had been restarted prior to her last appointment with me in January so, due to weight loss at that time, we discontinued it again, with the expectation that her weight and symptoms would improve again, as it did previously. Unfortunately, her weight has continued to decrease--down 8# from January. ?Wt Readings from Last 3 Encounters:  ?11/20/21 160 lb 6.4 oz (72.8 kg)  ?07/27/21 168 lb 1.6 oz (76.2 kg)  ?06/12/21 173 lb 6.4 oz (78.7 kg)  ? ?She continues to endorse poor appetite and early satiety--no improvement from January. She has not followed through with the age appropriate cancer screening as previously recommended. ROS is unremarkable. ? ?Physical exam unrevealing. ? ?Assessment: ?DDx remains broad at this time however malignancy is a primary concern. She seemed more engaged in our discussion regarding this today, and was agreeable to pap smear in the office which did not have any overt malignancy related findings.  ?Her brother has some form of myositis that he was diagnosed with at age 47 which presented as weakness and progressive weight loss and he is now undergoing some sort of infusion treatments for. . She is concerned about this as well. She is unable to provide more specific details regarding his disease. Other autoimmune conditions consider.  ?Gastroparesis is possible, which may be exacerbated due to poor glycemic control. She has not undergone a gastric emptying study in the past.  ? ?Plan ?? Pap smear performed in the office today ?? She will follow up with GI for diagnostic colonoscopy ?? Mammogram ordered today ?? Gastric emptying study ordered today ?? Check CBC, CMP, TSH, ESR, CRP, ANA w/ reflex, HIV, CK ?? She will follow up after completion of the above workup for ongoing evaluation ?

## 2021-11-21 NOTE — Telephone Encounter (Signed)
Pt's calling back - stated she took her BP while standing, which is 87/57 and having dizziness. Instructed pt when she stands; stand slowly and pause before she starts moving. She stated when she gets up at night to go to the [facility], her husband has noticed she's dizzy. ?She has an appt tomorrow. ?

## 2021-11-21 NOTE — Progress Notes (Signed)
? ?Office Visit ? ? Patient ID: [name], female    DOB: 1960/08/16, 61 y.o.   MRN: [id number]   PCP: Mitzi Hansen, MD  ? ?Subjective:  ? ?[name] is a 61 y.o. year old female with diabetes, fibromyalgia and osteoarthritis who presents for follow up of chronic medical conditions and ongoing weight loss. Please refer to problem based charting for assessment and plan.  ? ? ?ACTIVE MEDICATIONS  ? ?Outpatient Medications Prior to Visit  ?Medication Sig  ? ACCU-CHEK FASTCLIX LANCETS MISC Check three times a day to check blood sugar. 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 insulin dependent  ? atorvastatin (LIPITOR) 20 MG tablet Take 1 tablet (20 mg total) by mouth daily.  ? Blood Glucose Monitoring Suppl (ACCU-CHEK GUIDE ME) w/Device KIT 1 each by Does not apply route 3 (three) times daily.  ? celecoxib (CELEBREX) 100 MG capsule TAKE 1 CAPSULE BY MOUTH EVERY DAY  ? cetirizine (ZYRTEC) 10 MG tablet Take 1 tablet (10 mg total) by mouth daily.  ? cyclobenzaprine (FLEXERIL) 10 MG tablet Take 1 tablet (10 mg total) by mouth 2 (two) times daily as needed for muscle spasms.  ? Diclofenac Sodium 3 % GEL Apply 2 g topically 3 (three) times daily.  ? empagliflozin (JARDIANCE) 25 MG TABS tablet Take 1 tablet (25 mg total) by mouth daily before breakfast.  ? glucose blood (ACCU-CHEK GUIDE) test strip CHECK BLOOD SUGAR 3 TIMES DAILY  ? glycopyrrolate (ROBINUL) 1 MG tablet TAKE 1 TABLET BY MOUTH TWICE A DAY  ? Insulin Glargine Solostar (LANTUS) 100 UNIT/ML Solostar Pen Inject 25 units subcutaneously daily.  ? Insulin Pen Needle (PEN NEEDLES) 32G X 4 MM MISC 1 each by Does not apply route 5 (five) times daily.  ? Oxycodone HCl 10 MG TABS Take 1 tablet (10 mg total) by mouth every 6 (six) hours as needed.  ? pantoprazole (PROTONIX) 40 MG tablet Take 1 tablet (40 mg total) by mouth daily.  ? ?No facility-administered medications prior to visit.  ?  ? ?Objective:  ? ?BP (!) 120/57 (BP Location: Left Arm, Patient Position: Sitting, Cuff  Size: Normal)   Pulse (!) 58   Temp 97.6 ?F (36.4 ?C) (Oral)   Ht '5\' 2"'  (1.575 m)   Wt 160 lb 6.4 oz (72.8 kg)   LMP 07/04/2017   SpO2 99%   BMI 29.34 kg/m?  ?Wt Readings from Last 3 Encounters:  ?11/20/21 160 lb 6.4 oz (72.8 kg)  ?07/27/21 168 lb 1.6 oz (76.2 kg)  ?06/12/21 173 lb 6.4 oz (78.7 kg)  ?  ?BP Readings from Last 3 Encounters:  ?11/20/21 (!) 120/57  ?07/27/21 (!) 141/72  ?06/12/21 (!) 164/83  ? ?General: well appearing female in no distress, weight loss apparent on gross observation ?HEENT: no scleral icterus, no temporal wasting, MMM ?Cardiac: RRR, no LE edema ?Pulm: lungs clear throughout ?GI: abdomen soft, mild epigastric tenderness, no hepatosplenomegaly ?GU: chaperoned by Corey Skains, [place]. Normal external genitalia, normal vaginal walls, no atrophy, no cervical discharge ?Skin: no rash or lesion ?MSK: no distinct atrophy, no pain on muscle palpations, no weakness.  ? ? ?Assessment & Plan:  ? ? ? DM2 (diabetes mellitus, type 2) (HCC) - Primary (Chronic)  ?  Current medications:  lantus 35U daily, jardiance 58m daily ?A1C is continuing to increase-- 8.1 in Jan>>9.3 today. She reports that her husband is continuing to run out of insulin early, resulting him using hers since they do not separate their vials. She was out of lantus for about  a month. I encouraged separation of their insulin to ensure this does not continue to happen however she did not seem invested in this concept.  She is aware that if she or her husband run out of insulin, they need to call the office.  ?She does not take a consistent amount of insulin despite prior conversations regards this.  ?She reports occassional hypoglycemia in the morning, sometimes it is normal. I have requested she record her sugars and bring them with to her visits multiple times in the past however she has not done so. We have tried CGM placement in the past however it continued to fall off on multiple occassions.  ? ?Unfortunately, I do not think we should  make any adjustments to her insulin today since her doses are varying and she is having hypoglycemia intermittently. I am hesitent to start short acting insulin with her not being able to record her home glucoses for me to review and not being adherent with consistent dosing of lantus. My concern is that she would have far more hypoglycemic episodes. I think the best thing we can do right now is ensure she is taking the lantus consistently and continue to encourage her to separate her insulin from her husband's. Her diabetes has been well controlled on this in the past so I suspect that it will improve again with better adherence. ?A1C goal <7 ?  ?  ? Weight loss  ?  Ongoing weight loss was the primary focus of her appointment today. Previously seemed related to [place]. Victoza had been restarted prior to her last appointment with me in January so, due to weight loss at that time, we discontinued it again, with the expectation that her weight and symptoms would improve again, as it did previously. Unfortunately, her weight has continued to decrease--down 8# from January. ?Wt Readings from Last 3 Encounters:  ?11/20/21 160 lb 6.4 oz (72.8 kg)  ?07/27/21 168 lb 1.6 oz (76.2 kg)  ?06/12/21 173 lb 6.4 oz (78.7 kg)  ? ?She continues to endorse poor appetite and early satiety--no improvement from January. She has not followed through with the age appropriate cancer screening as previously recommended. ROS is unremarkable. ? ?Physical exam unrevealing. ? ?Assessment: ?DDx remains broad at this time however malignancy is a primary concern. She seemed more engaged in our discussion regarding this today, and was agreeable to pap smear in the office which did not have any overt malignancy related findings.  ?Her brother has some form of myositis that he was diagnosed with at age 17 which presented as weakness and progressive weight loss and he is now undergoing some sort of infusion treatments for. . She is concerned about  this as well. She is unable to provide more specific details regarding his disease. Other autoimmune conditions consider.  ?Gastroparesis is possible, which may be exacerbated due to poor glycemic control. She has not undergone a gastric emptying study in the past.  ? ?Plan ?Pap smear performed in the office today ?She will follow up with GI for diagnostic colonoscopy ?Mammogram ordered today ?Gastric emptying study ordered today ?Check CBC, CMP, TSH, ESR, CRP, ANA w/ reflex, HIV, CK ?She will follow up after completion of the above workup for ongoing evaluation ?  ?  ? ? ?Follow up after completion of above workup.  ? ? ?Pt discussed with Dr. Cain Sieve ? ?Mitzi Hansen, MD ?Internal Medicine Resident PGY-3 ?[facility] ?11/21/2021 11:33 AM  ?  ?

## 2021-11-21 NOTE — Telephone Encounter (Signed)
Called pt and asked if she can re-check her BP. Stated she's  not at home currently and will check it later and call the office. Denied being lightheaded when her BP was 80/62. Informed if SBP <90, her doctor wants her to come in and bring her BP cuff also. ?

## 2021-11-22 ENCOUNTER — Encounter: Payer: Medicaid Other | Admitting: Internal Medicine

## 2021-11-23 ENCOUNTER — Other Ambulatory Visit: Payer: Self-pay | Admitting: *Deleted

## 2021-11-23 ENCOUNTER — Telehealth: Payer: Self-pay

## 2021-11-23 NOTE — Telephone Encounter (Signed)
Patient called in stating her orthopaedic gave her a cream with verapamil and pentoxifylline that she uses on hands and feet 3-4 times daily since March 1st. Thinks this may be what is causing low BP/HR/dizziness. ? ?Also since oxycodone is only lasting ~4 hours she is wondering if she can get the XR.  ?

## 2021-11-23 NOTE — Telephone Encounter (Signed)
[facility] [address] [facility] on [facility].; does not know when they will. ?I called [facility] on [address] and they do have Oxycodone 10 mg qty 120 tabs. ?Please send new rx. ?Thanks  ?

## 2021-11-23 NOTE — Telephone Encounter (Signed)
Patient called she is requesting the same rx as her husband, pt is also requesting a call back from [place]. ? oxyCODONE ER (XTAMPZA ER) ?

## 2021-11-24 LAB — SPECIMEN STATUS REPORT

## 2021-11-24 LAB — C-REACTIVE PROTEIN: CRP: <1 mg/L (ref 0–10)

## 2021-11-24 LAB — CK: Total CK: 49 U/L (ref 32–182)

## 2021-11-24 LAB — ANTINUCLEAR ANTIBODIES, IFA: ANA Titer 1: NEGATIVE

## 2021-11-24 NOTE — Telephone Encounter (Signed)
Refill sent to requested pharmacy. She should not be out of medication until tomorrow if taking as prescribed. Please schedule appointment with PCP to discuss this further.  ?

## 2021-11-24 NOTE — Telephone Encounter (Signed)
Requesting to speak with a nurse about Oxycodone, states she need this today. She is in pain. Please call pt back.  ?

## 2021-11-27 ENCOUNTER — Telehealth: Payer: Self-pay | Admitting: *Deleted

## 2021-11-27 ENCOUNTER — Encounter: Payer: Self-pay | Admitting: Internal Medicine

## 2021-11-27 ENCOUNTER — Other Ambulatory Visit: Payer: Self-pay

## 2021-11-27 ENCOUNTER — Ambulatory Visit: Payer: Medicaid Other | Admitting: Internal Medicine

## 2021-11-27 VITALS — BP 137/67 | HR 62 | Temp 98.3°F | Ht 62.0 in | Wt 161.8 lb

## 2021-11-27 DIAGNOSIS — Z79891 Long term (current) use of opiate analgesic: Secondary | ICD-10-CM | POA: Diagnosis not present

## 2021-11-27 DIAGNOSIS — R634 Abnormal weight loss: Secondary | ICD-10-CM

## 2021-11-27 MED ORDER — OXYCODONE HCL 10 MG PO TABS: 10.0000 mg | ORAL_TABLET | Freq: Four times a day (QID) | ORAL | 0 refills | Status: DC | PRN

## 2021-11-27 NOTE — Progress Notes (Signed)
? ?Office Visit ? ? Patient ID: [name], female    DOB: 07/31/1960, 61 y.o.   MRN: [id number]   PCP: Marisa Hansen, MD  ? ?Subjective:  ?CC: weight loss follow up  ? ?[name] is a 61 y.o. year old female who presents for the above medical condition(s). Please refer to problem based charting for assessment and plan. ? ? ? ?Objective:  ? ?BP 137/67 (BP Location: Left Arm, Patient Position: Sitting, Cuff Size: Normal)   Pulse 62   Temp 98.3 ?F (36.8 ?C) (Oral)   Ht '5\' 2"'  (1.575 m)   Wt 161 lb 12.8 oz (73.4 kg)   LMP 07/04/2017   SpO2 98%   BMI 29.59 kg/m?  ? ?General: Well-appearing, no distress ?Cardiac: Heart regular rate ?Pulm: Lungs clear ?Skin: No rash or lesion ?Assessment & Plan:  ? ?Problem List Items Addressed This Visit   ? ?  ? Other  ? Long-term current use of opiate analgesic for osteoarthritis and fibromyalgia (Chronic)  ?  When speaking to our nursing staff last week on the phone, she had commented on wanting to transition from regular oxycodone to the extended release form.  She did not address this at her office visit today.  Should she inquire into this again in the future, I would consider recommending referral to pain management.  Oxycodone refill sent to her pharmacy today. ? ?  ?  ? Weight loss - Primary  ?  She presents for follow-up and ongoing evaluation of unintentional weight loss.  Lab results, including CBC, CMP, ESR, CRP, ANA, CK, TSH, HIV, and Pap smear reviewed with patient.  Labs were unrevealing.  Following our last visit, she had called back to the clinic, commenting on some dizziness that she was experiencing associated with hypotension.  She was able to expand on this little bit more detail today.  She notes that the blood pressure of 87/57 occurred when she had gone from a sitting to standing position and experience lightheadedness.  She was instructed to bring her blood pressure cuff to her next office visit for calibration.  She notes that she has  decreased the use of verapamil cream that had been ordered by podiatrist for neuropathy.  Since then, she continues to experience some lightheadedness but has not had any blood pressures as low as they were when she had called our office last week.  No change in her appetite.  She has not received a call from GI or from radiology to schedule the gastric emptying study. ? ?Assessment:  ?In light of the new data we have from her last set of labs, my primary differentials at this point are gastroparesis and orthostasis.  Malignancy remains the most important to rule out at this point.  Discussed the potential for Robinul to be playing a role in her orthostasis and poor appetite.  She is unwilling to trial given a short period of time off of this due to the positive impact it has on her quality of life.  Note, orthostatic vital signs were normal in the office today.  She did not bring her home blood pressure cuff for calibration today.  Consider autonomic dysfunction in the setting of uncontrolled diabetes ? ?Plan ?- Follow-up with GI for diagnostic colonoscopy +/- EGD per GIs discretion ?- Contact information for mammogram scheduling provided today ?- Gastric emptying study as previously ordered ?- In light of the negative autoimmune work-up, will add a CT chest abdomen and pelvis to evaluate for occult  malignancy ? ? ?  ?  ? Relevant Orders  ? CT CHEST ABDOMEN PELVIS W CONTRAST  ? ?Follow-up after completion of the above work-up ?- If the above work-up is negative, I would discuss a trial of duloxetine or another SSRI ? ? ?Pt discussed with [name] ? ?Marisa Hansen, MD ?Internal Medicine Resident PGY-3 ?[facility] ?11/27/2021 5:21 PM  ?  ?

## 2021-11-27 NOTE — Telephone Encounter (Signed)
Pt's calling about her pain med not being refilled. Stated she has been out of pain med x 3 days and she is in a lot of pain. Informed pt she has an appt today with her PCP - she stated she might not come today b/c she's upset , hurting and not in the best of mood. After reviewing her chart, I informed pt, Oxycodone was refilled today and sent to [facility]. Informed pt her appt today is to discuss her low BP, dizziness - stated she might or not come. ?

## 2021-11-27 NOTE — Patient Instructions (Addendum)
TO DO LIST ? ?Gastric emptying study ?CT scan ?Mammogram ?Follow up with GI for colonoscopy ?Margaretmary Bayley, MD ?Doctor in [place], [place] ?Address: [address], [place], [place] [address] ?Phone: [phone] ?

## 2021-11-27 NOTE — Assessment & Plan Note (Addendum)
She presents for follow-up and ongoing evaluation of unintentional weight loss.  Lab results, including CBC, CMP, ESR, CRP, ANA, CK, TSH, HIV, and Pap smear reviewed with patient.  Labs were unrevealing.  Following our last visit, she had called back to the clinic, commenting on some dizziness that she was experiencing associated with hypotension.  She was able to expand on this little bit more detail today.  She notes that the blood pressure of 87/57 occurred when she had gone from a sitting to standing position and experience lightheadedness.  She was instructed to bring her blood pressure cuff to her next office visit for calibration.  She notes that she has decreased the use of verapamil cream that had been ordered by podiatrist for neuropathy.  Since then, she continues to experience some lightheadedness but has not had any blood pressures as low as they were when she had called our office last week.  No change in her appetite.  She has not received a call from GI or from radiology to schedule the gastric emptying study. ? ?Assessment:  ?In light of the new data we have from her last set of labs, my primary differentials at this point are gastroparesis and orthostasis.  Malignancy remains the most important to rule out at this point.  Discussed the potential for Robinul to be playing a role in her orthostasis and poor appetite.  She is unwilling to trial given a short period of time off of this due to the positive impact it has on her quality of life.  Note, orthostatic vital signs were normal in the office today.  She did not bring her home blood pressure cuff for calibration today.  Consider autonomic dysfunction in the setting of uncontrolled diabetes ? ?Plan ?- Follow-up with GI for diagnostic colonoscopy +/- EGD per GIs discretion ?- Contact information for mammogram scheduling provided today ?- Gastric emptying study as previously ordered ?- In light of the negative autoimmune work-up, will add a CT chest  abdomen and pelvis to evaluate for occult malignancy ? ?

## 2021-11-27 NOTE — Telephone Encounter (Signed)
Pt has an appt today 5/15. ?

## 2021-11-27 NOTE — Assessment & Plan Note (Signed)
When speaking to our nursing staff last week on the phone, she had commented on wanting to transition from regular oxycodone to the extended release form.  She did not address this at her office visit today.  Should she inquire into this again in the future, I would consider recommending referral to pain management.  Oxycodone refill sent to her pharmacy today. ?

## 2021-11-27 NOTE — Telephone Encounter (Signed)
Ok, that is fine. Thank you for relaying the conversation.

## 2021-11-28 NOTE — Progress Notes (Signed)
Internal Medicine Clinic Attending  Case discussed with Dr. Christian  At the time of the visit.  We reviewed the resident's history and exam and pertinent patient test results.  I agree with the assessment, diagnosis, and plan of care documented in the resident's note.  

## 2021-12-03 ENCOUNTER — Other Ambulatory Visit: Payer: Self-pay | Admitting: Internal Medicine

## 2021-12-06 ENCOUNTER — Ambulatory Visit: Payer: Medicaid Other

## 2021-12-07 ENCOUNTER — Ambulatory Visit: Payer: Medicaid Other

## 2021-12-22 ENCOUNTER — Other Ambulatory Visit: Payer: Self-pay

## 2021-12-22 NOTE — Telephone Encounter (Signed)
Oxycodone HCl 10 MG TABS, [facility]FILL [facility]QUEST @ [id number] [place] [place] [address]

## 2021-12-22 NOTE — Telephone Encounter (Signed)
Last rx written 11/27/21. Last OV 11/27/21. Next OV - has not been scheduled . UDS 09/21/20.

## 2021-12-24 ENCOUNTER — Other Ambulatory Visit: Payer: Self-pay | Admitting: Internal Medicine

## 2021-12-24 MED ORDER — OXYCODONE HCL 10 MG PO TABS: 10.0000 mg | ORAL_TABLET | Freq: Four times a day (QID) | ORAL | 0 refills | Status: DC | PRN

## 2021-12-26 ENCOUNTER — Other Ambulatory Visit: Payer: Self-pay | Admitting: Internal Medicine

## 2021-12-26 DIAGNOSIS — E119 Type 2 diabetes mellitus without complications: Secondary | ICD-10-CM

## 2021-12-26 DIAGNOSIS — M1991 Primary osteoarthritis, unspecified site: Secondary | ICD-10-CM

## 2021-12-28 ENCOUNTER — Other Ambulatory Visit (HOSPITAL_COMMUNITY): Payer: Medicaid Other

## 2022-01-08 ENCOUNTER — Encounter: Payer: Self-pay | Admitting: *Deleted

## 2022-01-08 ENCOUNTER — Ambulatory Visit: Payer: Medicaid Other

## 2022-01-08 ENCOUNTER — Ambulatory Visit (HOSPITAL_COMMUNITY)
Admission: RE | Admit: 2022-01-08 | Discharge: 2022-01-08 | Disposition: A | Discharge status: Home / Self Care | Payer: Medicaid Other | Source: Ambulatory Visit | Attending: Internal Medicine | Admitting: Internal Medicine

## 2022-01-08 ENCOUNTER — Ambulatory Visit: Payer: Medicaid Other | Admitting: Internal Medicine

## 2022-01-08 ENCOUNTER — Encounter (HOSPITAL_COMMUNITY): Admission: RE | Admit: 2022-01-08 | Payer: Medicaid Other | Source: Ambulatory Visit

## 2022-01-08 ENCOUNTER — Encounter: Payer: Self-pay | Admitting: Internal Medicine

## 2022-01-08 ENCOUNTER — Other Ambulatory Visit: Payer: Self-pay

## 2022-01-08 VITALS — BP 126/53 | HR 54 | Temp 98.3°F | Resp 20 | Ht 62.0 in | Wt 158.1 lb

## 2022-01-08 DIAGNOSIS — M720 Palmar fascial fibromatosis [Dupuytren]: Secondary | ICD-10-CM | POA: Diagnosis not present

## 2022-01-08 DIAGNOSIS — M79645 Pain in left finger(s): Secondary | ICD-10-CM | POA: Diagnosis present

## 2022-01-08 DIAGNOSIS — R634 Abnormal weight loss: Secondary | ICD-10-CM

## 2022-01-08 NOTE — Patient Instructions (Addendum)
[name], it was a pleasure seeing you today! You endorsed feeling well today. Below are some of the things we talked about this visit. We look forward to seeing you in the follow up appointment!  Today we discussed: You presented for left thumb pain after injury.  We will obtain an x-ray of your left hand and we will give you results once we get them back.  Continue icing, using Voltaren gel, and taking Tylenol as needed for pain.  You are already taking Celexa which is an NSAID so I would not take any more NSAIDs for the pain.  Continue taking your other medications as prescribed. There were imaging ordered for your weight loss.  Please get those completed and follow-up to go over them and plan going forward.  If the pain does not get any better, please come back for evaluation.  I have ordered the following labs today:  Lab Orders  No laboratory test(s) ordered today      Referrals ordered today:   Referral Orders  No referral(s) requested today     I have ordered the following medication/changed the following medications:   Stop the following medications: There are no discontinued medications.   Start the following medications: No orders of the defined types were placed in this encounter.    Follow-up: 1 month follow up for unintentional weight loss or earlier if needed.   Please make sure to arrive 15 minutes prior to your next appointment. If you arrive late, you may be asked to reschedule.   We look forward to seeing you next time. Please call our clinic at [phone] if you have any questions or concerns. The best time to call is Monday-Friday from 9am-4pm, but there is someone available 24/7. If after hours or the weekend, call the main hospital number and ask for the Internal Medicine Resident On-Call. If you need medication refills, please notify your pharmacy one week in advance and they will send [name] a request.  Thank you for letting us take part in your  care. Wishing you the best!  Thank you, Idamae Schuller, MD.

## 2022-01-08 NOTE — Progress Notes (Signed)
CC: thumb pain  HPI:  [name] is a 61 y.o. with medical history as below presenting to [facility] for thumb pain.   Please see problem-based list for further details, assessments, and plans.  Past Medical History:  Diagnosis Date   Adhesive capsulitis of right shoulder    with underlying tendinopathy rotator cuff   Arthritis    Diabetes mellitus    oral tx   Fibromyalgia    GERD (gastroesophageal reflux disease)    History of post-sterilization tuboplasty 2000   Plantar fasciitis    Right   Shortness of breath    with exertion   Sleep apnea 5 plus yrs   study -pt could not sleep test inconclusive.    Tear of meniscus of left knee    x2   Tear of meniscus of right knee     Current Outpatient Medications (Endocrine & Metabolic):    empagliflozin (JARDIANCE) 25 MG TABS tablet, Take 1 tablet (25 mg total) by mouth daily before breakfast.   Insulin Glargine Solostar (LANTUS) 100 UNIT/ML Solostar Pen, Inject 25 units subcutaneously daily.  Current Outpatient Medications (Cardiovascular):    atorvastatin (LIPITOR) 20 MG tablet, Take 1 tablet (20 mg total) by mouth daily.  Current Outpatient Medications (Respiratory):    cetirizine (ZYRTEC) 10 MG tablet, Take 1 tablet (10 mg total) by mouth daily.  Current Outpatient Medications (Analgesics):    celecoxib (CELEBREX) 100 MG capsule, TAKE 1 CAPSULE BY MOUTH EVERY DAY   Oxycodone HCl 10 MG TABS, Take 1 tablet (10 mg total) by mouth every 6 (six) hours as needed.   [START ON 02/23/2022] Oxycodone HCl 10 MG TABS, Take 1 tablet (10 mg total) by mouth every 6 (six) hours as needed.   [START ON 01/23/2022] Oxycodone HCl 10 MG TABS, Take 1 tablet (10 mg total) by mouth every 6 (six) hours as needed.   Current Outpatient Medications (Other):    Blood Glucose Monitoring Suppl (ACCU-CHEK GUIDE ME) w/Device KIT, 1 EACH BY DOES NOT APPLY ROUTE 3 (THREE) TIMES DAILY.   pantoprazole (PROTONIX) 40 MG tablet, TAKE 1 TABLET BY MOUTH  EVERY DAY   ACCU-CHEK FASTCLIX LANCETS MISC, Check three times a day to check blood sugar. 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 insulin dependent   cyclobenzaprine (FLEXERIL) 10 MG tablet, Take 1 tablet (10 mg total) by mouth 2 (two) times daily as needed for muscle spasms.   Diclofenac Sodium 3 % GEL, Apply 2 g topically 3 (three) times daily.   glucose blood (ACCU-CHEK GUIDE) test strip, CHECK BLOOD SUGAR 3 TIMES DAILY   glycopyrrolate (ROBINUL) 1 MG tablet, TAKE 1 TABLET BY MOUTH TWICE A DAY   Insulin Pen Needle (PEN NEEDLES) 32G X 4 MM MISC, 1 each by Does not apply route 5 (five) times daily.  Review of Systems:  Review of system negative unless stated in the problem list or HPI.    Physical Exam:  Vitals:   01/08/22 1543  BP: (!) 126/53  Pulse: (!) 54  Resp: 20  Temp: 98.3 F (36.8 C)  TempSrc: Oral  SpO2: 100%  Weight: 158 lb 1.6 oz (71.7 kg)  Height: _0  (1.575 m)    Physical Exam General: NAD HENT: NCAT Lungs: CTAB, no wheeze, rhonchi or rales.  Cardiovascular: Normal heart sounds, no r/m/g, 2+ pulses in all extremities. No LE edema Abdomen: No TTP, normal bowel sounds MSK: Pain at base of thumb and in anatomic snuffbox.  Skin: no lesions noted on exposed skin Neuro: Alert and  oriented x4. CN grossly intact Psych: Normal mood and normal affect   Assessment & Plan:   See Encounters Tab for problem based charting.  Patient discussed with Dr. Roderic Ovens, MD

## 2022-01-10 ENCOUNTER — Telehealth: Payer: Self-pay

## 2022-01-10 DIAGNOSIS — M79645 Pain in left finger(s): Secondary | ICD-10-CM | POA: Insufficient documentation

## 2022-01-10 HISTORY — DX: Pain in left finger(s): M79.645

## 2022-01-10 NOTE — Telephone Encounter (Signed)
Pt is requesting a call back .Marland Kitchen She is wanting to know if her results  from her Xrays have come back as of yet

## 2022-01-10 NOTE — Assessment & Plan Note (Signed)
Patient was seen for unintentional weight loss. Since this was an acute visit, I advised patient to obtain the studies that were recommended at that time and follow up with our clinic after they are completed. I will send the front staff to schedule one month follow up to follow up on unintentional weight loss and hopefully the imaging is done by that time.

## 2022-01-10 NOTE — Assessment & Plan Note (Addendum)
Patient presented with acute left thumb pain. Concern for fracture vs sprain. Patient had tenderness of the anatomic snuff box and reports fall on outstretched hand concerning for fracture of scaphoid fracture. Will order X ray of wrist and left thumb to check for fractures. If negative will offer patient repeat imaging vs hand orthopedic referral.  -XR of wrist and thumb ordered.  -Continue conservative treatment and place thumb in a stabilizing brace.  Addendum: Her Xray did not show any fracture and given her hx of fibromatosis we will obtain MRI of left wrist and refer to hand orthopedic surgeon. When I gave the results to the patient, she stated she will call her orthopedics office to see if the hand orthopedic at that location can see her if not we will place referral tomorrow.

## 2022-01-11 ENCOUNTER — Ambulatory Visit: Payer: Medicaid Other

## 2022-01-12 NOTE — Progress Notes (Signed)
Internal Medicine Clinic Attending  Case discussed with the resident at the time of the visit.  We reviewed the resident's history and exam and pertinent patient test results.  I agree with the assessment, diagnosis, and plan of care documented in the resident's note.  

## 2022-01-15 ENCOUNTER — Ambulatory Visit: Payer: Medicaid Other

## 2022-01-18 ENCOUNTER — Other Ambulatory Visit: Payer: Self-pay | Admitting: Internal Medicine

## 2022-01-18 DIAGNOSIS — E113393 Type 2 diabetes mellitus with moderate nonproliferative diabetic retinopathy without macular edema, bilateral: Secondary | ICD-10-CM

## 2022-01-23 ENCOUNTER — Telehealth: Payer: Self-pay

## 2022-01-23 NOTE — Addendum Note (Signed)
Addended by: Idamae Schuller on: 01/23/2022 03:26 PM   Modules accepted: Orders

## 2022-01-23 NOTE — Telephone Encounter (Signed)
Call to [facility].  Prescription or Oxycodone is awaiting fill.  Call to patient informed [facility] [address]  Patient to call Pharmacy to let them know to fill there.Marland Kitchen

## 2022-01-23 NOTE — Telephone Encounter (Signed)
Oxycodone HCl 10 MG TABS, REFILL REQUEST ON [facility] ON [address]

## 2022-02-04 ENCOUNTER — Other Ambulatory Visit: Payer: Self-pay | Admitting: Internal Medicine

## 2022-02-04 DIAGNOSIS — E113393 Type 2 diabetes mellitus with moderate nonproliferative diabetic retinopathy without macular edema, bilateral: Secondary | ICD-10-CM

## 2022-02-08 ENCOUNTER — Telehealth: Payer: Self-pay | Admitting: *Deleted

## 2022-02-08 ENCOUNTER — Ambulatory Visit: Payer: Medicaid Other

## 2022-02-08 NOTE — Telephone Encounter (Signed)
Also spoke with Debroah Baller (husband) and gave him the radiology appointment, also mailed the information and left voice message to patient.

## 2022-02-09 ENCOUNTER — Ambulatory Visit: Payer: Medicaid Other

## 2022-02-16 ENCOUNTER — Ambulatory Visit (HOSPITAL_COMMUNITY): Admission: RE | Admit: 2022-02-16 | Payer: Medicaid Other | Source: Ambulatory Visit

## 2022-02-21 ENCOUNTER — Ambulatory Visit: Payer: Medicaid Other

## 2022-02-26 ENCOUNTER — Other Ambulatory Visit: Payer: Self-pay

## 2022-02-26 ENCOUNTER — Encounter: Payer: Self-pay | Admitting: Student

## 2022-02-26 ENCOUNTER — Encounter: Payer: Medicaid Other | Admitting: Internal Medicine

## 2022-02-26 ENCOUNTER — Ambulatory Visit: Payer: Medicaid Other | Admitting: Student

## 2022-02-26 VITALS — BP 134/71 | HR 84 | Temp 98.2°F | Ht 62.0 in | Wt 147.6 lb

## 2022-02-26 DIAGNOSIS — R634 Abnormal weight loss: Secondary | ICD-10-CM

## 2022-02-26 DIAGNOSIS — Z794 Long term (current) use of insulin: Secondary | ICD-10-CM | POA: Diagnosis not present

## 2022-02-26 DIAGNOSIS — E113393 Type 2 diabetes mellitus with moderate nonproliferative diabetic retinopathy without macular edema, bilateral: Secondary | ICD-10-CM

## 2022-02-26 DIAGNOSIS — E113399 Type 2 diabetes mellitus with moderate nonproliferative diabetic retinopathy without macular edema, unspecified eye: Secondary | ICD-10-CM | POA: Diagnosis present

## 2022-02-26 LAB — BETA-HYDROXYBUTYRIC ACID: Beta-Hydroxybutyric Acid: 0.77 mmol/L — ABNORMAL HIGH (ref 0.05–0.27)

## 2022-02-26 LAB — POCT GLYCOSYLATED HEMOGLOBIN (HGB A1C): Hemoglobin A1C: 10.9 % — AB (ref 4.0–5.6)

## 2022-02-26 LAB — GLUCOSE, CAPILLARY: Glucose-Capillary: 211 mg/dL — ABNORMAL HIGH (ref 70–99)

## 2022-02-26 NOTE — Progress Notes (Signed)
CC: Unintentional Weight Loss  HPI:   [name] is a 61 y.o. female with past medical history of diabetes, back pain plus sciatica, fibromyalgia, and knee OA who presents for at least 1 year of unintentional weight loss. Last seen at [facility] on July 26.    Past Medical History:  Diagnosis Date   Adhesive capsulitis of right shoulder    with underlying tendinopathy rotator cuff   Arthritis    Diabetes mellitus    oral tx   Fibromyalgia    GERD (gastroesophageal reflux disease)    History of post-sterilization tuboplasty 2000   Plantar fasciitis    Right   Shortness of breath    with exertion   Sleep apnea 5 plus yrs   study -pt could not sleep test inconclusive.    Tear of meniscus of left knee    x2   Tear of meniscus of right knee      Review of Systems:    Reports fatigue, weakness, unintentional weight loss, sweats, shortness of breath Denies fever, bowel or urinary changes, cough   Physical Exam:  Vitals:   02/26/22 1116  BP: 134/71  Pulse: 84  Temp: 98.2 F (36.8 C)  TempSrc: Oral  SpO2: 99%  Weight: 147 lb 9.6 oz (67 kg)  Height: '5\' 2"'$  (1.575 m)    General:   awake and alert, sitting comfortably in chair, cooperative, not in acute distress Skin:   warm and dry, intact without any obvious lesions or scars, no rashes or lesions  Lungs:   normal respiratory effort, breathing unlabored, symmetrical chest rise, no crackles or wheezing Cardiac:   regular rate and rhythm, normal S1 and S2 Musculoskeletal:   motor strength 5 /5 in all four extremities except hip flexion 5- /5 bilaterally Psychiatric:   mood and affect normal, intelligible speech    Assessment & Plan:   Weight loss Patient weighed 147 pounds today which is down from 158 pounds on June 26.  Is been steadily losing weight for the past year and a half.  Her weight loss has been associated with chronic pain, fatigue, shortness of breath, intermittent lightheadedness, heat intolerance,  and muscle weakness.  She also reports palpitations that started about 2 months ago and now occur more frequently.  Experiences episodes of profuse sweating which started about 4 to 5 years ago, but now are occurring several times per day.  She takes glycopyrrolate for these, but it is weaned and effectiveness recently.  Also reports tingling in her lower back, most likely related to her lumbosacral radiculopathy.  She is also reporting early satiety and has a difficult time eating enough.  She says that her weakness has been affecting her entire body, but notes specific difficulty with traversing stairs, going up and down them.  He has been taking insulin glargine 25 to 35 units daily and empagliflozin daily for her diabetes.  Her brother was diagnosed with a form of myositis after seeking medical attention for similar symptoms. No one else in her family has similar symptoms. She denies any recent stool changes in color or consistency, swelling, orthopnea, any urinary changes, and cough.  Recent fecal occult occult blood test was negative. At her last visit, a gastric emptying, chest abdomen pelvis, and mammography were ordered.  She is scheduled to mammography for August 31, but the other tests are still pending.  Recent lab results including ANA, TSH, CRP all negative.  Etiology of her weight loss remains unclear, could be related  to an autoimmune diabetes variant.  Malignancy remains on the differential as well.  -Order urinalysis for ketones and serum beta-hydroxybutyrate acid -Reorder gastric emptying study -Follow-up on CT abdomen-pelvis order -Complete mammography, scheduled for August 31     See Encounters Tab for problem based charting.  Patient seen with Dr. Philipp Ovens

## 2022-02-26 NOTE — Patient Instructions (Signed)
  Thank you, Ms.Cagney Pouncey, for allowing [name] to provide your care today. Today we discussed . . .  > Weight Loss       - we have ordered some lab tests to help diagnose your weight loss, which may be related to diabetes       - please make sure to get your mammography, gastric emptying, and CT abdomen-pelvis > Diabetes       - continue to take your insulin glargine and empagliflozin daily    I have ordered the following labs for you:   Lab Orders         Urinalysis, Reflex Microscopic         Beta-hydroxybutyric acid         POC Hbg A1C       Tests ordered today:  Follow up on previously ordered Gastric Emptying Study, CT Abdomen-Pelvis, and Mammogram   Referrals ordered today:   Referral Orders  No referral(s) requested today      I have ordered the following medication/changed the following medications:   Stop the following medications: There are no discontinued medications.   Start the following medications: No orders of the defined types were placed in this encounter.     Follow up:  1 month     Remember:  Please make sure to complete your Gastric Emptying, CT Abdomen-Pelvis, and Mammogram studies. We have ordered some additional labs to check the severity of your diabetes. We will see you in about one month!   Should you have any questions or concerns please call the internal medicine clinic at [phone].     Roswell Nickel, MD [place]

## 2022-02-26 NOTE — Progress Notes (Deleted)
   Established Patient Office Visit  Subjective   Patient ID: [name], female    DOB: 08-Jul-1961  Age: 61 y.o. MRN: [id number]  No chief complaint on file.   HPI  {History (Optional):23778}  ROS    Objective:     LMP 07/04/2017  {Vitals History (Optional):23777}  Physical Exam   No results found for any visits on 02/26/22.  {Labs (Optional):23779}  The 10-year ASCVD risk score (Arnett DK, et al., 2019) is: 4.2%    Assessment & Plan:   Problem List Items Addressed This Visit   None   No follow-ups on file.    Charise Killian, MD

## 2022-02-26 NOTE — Assessment & Plan Note (Addendum)
Patient weighed 147 pounds today which is down from 158 pounds on June 26.  Is been steadily losing weight for the past year and a half.  Her weight loss has been associated with chronic pain, fatigue, shortness of breath, intermittent lightheadedness, heat intolerance, and muscle weakness.  She also reports palpitations that started about 2 months ago and now occur more frequently.  Experiences episodes of profuse sweating which started about 4 to 5 years ago, but now are occurring several times per day.  She takes glycopyrrolate for these, but it is weaned and effectiveness recently.  Also reports tingling in her lower back, most likely related to her lumbosacral radiculopathy.  She is also reporting early satiety and has a difficult time eating enough.  She says that her weakness has been affecting her entire body, but notes specific difficulty with traversing stairs, going up and down them.  He has been taking insulin glargine 25 to 35 units daily and empagliflozin daily for her diabetes.  Her brother was diagnosed with a form of myositis after seeking medical attention for similar symptoms. No one else in her family has similar symptoms. She denies any recent stool changes in color or consistency, swelling, orthopnea, any urinary changes, and cough.  Recent fecal occult occult blood test was negative. At her last visit, a gastric emptying, chest abdomen pelvis, and mammography were ordered.  She is scheduled to mammography for August 31, but the other tests are still pending.  Recent lab results including ANA, TSH, CRP all negative.  Etiology of her weight loss remains unclear, could be related to an autoimmune diabetes variant.  Malignancy remains on the differential as well.  -Order urinalysis for ketones and serum beta-hydroxybutyrate acid -Reorder gastric emptying study -Follow-up on CT abdomen-pelvis order -Complete mammography, scheduled for August 31

## 2022-02-27 LAB — URINALYSIS, ROUTINE W REFLEX MICROSCOPIC
Bilirubin, UA: NEGATIVE
Leukocytes,UA: NEGATIVE
Nitrite, UA: NEGATIVE
Protein,UA: NEGATIVE
RBC, UA: NEGATIVE
Specific Gravity, UA: >=1.03 — AB (ref 1.005–1.030)
Urobilinogen, Ur: 0.2 mg/dL (ref 0.2–1.0)
pH, UA: 5.5 (ref 5.0–7.5)

## 2022-02-27 NOTE — Progress Notes (Signed)
Internal Medicine Clinic Attending  I saw and evaluated the patient.  I personally confirmed the key portions of the history and exam documented by Dr. Jodi Mourning and I reviewed pertinent patient test results.  The assessment, diagnosis, and plan were formulated together and I agree with the documentation in the resident's note.   I suspect patient's weight loss is related to her poorly controlled diabetes with ketosis. Beta hydroxybutyric acid today was mildly elevated with evidence of ketones in her urine. She has responded poorly in the past to non insulin therapies and Hgb A1c today is up to 10.9. She may benefit from autoimmune work up of her diabetes. Although she was diagnosed in adulthood given her weight loss and ketosis, she could be an adult autoimmune phenotype. Recommend we initiate low dose meal time insulin and d/c jardiance. Return to clinic in 2 week with glucometer for further titration. I will have Dr. Jodi Mourning give her a call.

## 2022-02-28 ENCOUNTER — Other Ambulatory Visit: Payer: Self-pay | Admitting: Student

## 2022-02-28 MED ORDER — INSULIN ASPART 100 UNIT/ML IJ SOLN: 3.0000 [IU] | Freq: Three times a day (TID) | INTRAMUSCULAR | 11 refills | Status: DC

## 2022-03-01 ENCOUNTER — Other Ambulatory Visit: Payer: Self-pay | Admitting: Internal Medicine

## 2022-03-01 DIAGNOSIS — E113393 Type 2 diabetes mellitus with moderate nonproliferative diabetic retinopathy without macular edema, bilateral: Secondary | ICD-10-CM

## 2022-03-08 MED ORDER — ACCU-CHEK GUIDE VI STRP: ORAL_STRIP | 9 refills | Status: DC

## 2022-03-08 MED ORDER — OXYBUTYNIN CHLORIDE ER 10 MG PO TB24: 10.0000 mg | ORAL_TABLET | Freq: Every day | ORAL | 2 refills | Status: DC

## 2022-03-08 MED ORDER — PEN NEEDLES 32G X 4 MM MISC: 1.0000 | Freq: Every day | 1 refills | Status: DC

## 2022-03-08 NOTE — Addendum Note (Signed)
Addended by: Serita Butcher on: 03/08/2022 08:52 AM   Modules accepted: Orders

## 2022-03-13 ENCOUNTER — Other Ambulatory Visit: Payer: Self-pay | Admitting: *Deleted

## 2022-03-13 DIAGNOSIS — E113393 Type 2 diabetes mellitus with moderate nonproliferative diabetic retinopathy without macular edema, bilateral: Secondary | ICD-10-CM

## 2022-03-13 MED ORDER — ACCU-CHEK GUIDE VI STRP: ORAL_STRIP | 9 refills | Status: DC

## 2022-03-13 NOTE — Telephone Encounter (Signed)
Hi [name]. The prescription says to check three times/day. Do they need something different?

## 2022-03-13 NOTE — Telephone Encounter (Signed)
The rx states "Use as instructed" instead of how many times pt tests her BS's. Thanks

## 2022-03-13 NOTE — Telephone Encounter (Signed)
Fax from [facility] test strips rx need to include how many times pt needs to be testing per day for insurance to cover. Send new rx. Thanks

## 2022-03-14 ENCOUNTER — Telehealth: Payer: Self-pay

## 2022-03-14 NOTE — Telephone Encounter (Signed)
[name] Key: [id number] help? Call us at [phone] Outcome Additional Information Required The PA system cannot find any matching drug for the NDC/Drug sent. PA cannot be created. Please contact your system administrator. Drug Lantus SoloStar 100UNIT/ML pen-injectors Form [facility] Electronic [place] Form [phone] NCPDP)

## 2022-03-14 NOTE — Telephone Encounter (Signed)
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

## 2022-03-15 ENCOUNTER — Ambulatory Visit
Admission: RE | Admit: 2022-03-15 | Discharge: 2022-03-15 | Disposition: A | Discharge status: Home / Self Care | Payer: Medicaid Other | Source: Ambulatory Visit | Attending: Internal Medicine | Admitting: Internal Medicine

## 2022-03-15 DIAGNOSIS — Z1231 Encounter for screening mammogram for malignant neoplasm of breast: Secondary | ICD-10-CM

## 2022-03-15 NOTE — Telephone Encounter (Signed)
Prior Authorization form was faxed back to patients insurance, received a fax back from [facility]  stating: The above requested medication does not require authorization from [employer] and [place]. Please contact the [place] pharmacy to have the prescription processed (information packet has been placed in doctors box)

## 2022-03-15 NOTE — Telephone Encounter (Signed)
Physical PA form completed and placed in medical records for fax.

## 2022-03-26 ENCOUNTER — Other Ambulatory Visit: Payer: Self-pay | Admitting: Student

## 2022-03-27 ENCOUNTER — Other Ambulatory Visit: Payer: Self-pay | Admitting: Internal Medicine

## 2022-03-27 MED ORDER — OXYBUTYNIN CHLORIDE ER 10 MG PO TB24: 10.0000 mg | ORAL_TABLET | Freq: Every day | ORAL | 2 refills | Status: DC

## 2022-03-27 NOTE — Telephone Encounter (Signed)
Called pt to schedule a f/u appt - no answer; left message to schedule an appt on pt's vm.

## 2022-03-27 NOTE — Progress Notes (Signed)
Oxybutynin refill request received. This was resumed at clinic visit 02/26/22 by Dr. Jodi Mourning with refills. Given refills, request was refused and RN [name] called pharmacy to confirm. Pharmacy stating they did not receive the prescription although our records show it was received and dispensed. Patient reports she never received the medication. I will send a new prescription although not clear why the dispense report is inaccurate. Oxybutynin was previously stopped for orthostasis, [name] will need an appointment with within one month to f/u on this. [name], can you please assist in scheduling? Thank you.

## 2022-03-27 NOTE — Telephone Encounter (Signed)
I called the pt who stated she has not received this medication; needs a PA. I called [facility] who stated they did not receive Oxybutynin on 8/24 even our record shows it was received. Can you send another rx? Thanks

## 2022-04-04 NOTE — Progress Notes (Signed)
Appointment scheduled with Dr Saverio Danker on 04/30/22. Pt stated she has not received Oxybutynin from the pharmacy.

## 2022-04-04 NOTE — Telephone Encounter (Signed)
I called [facility] again - stated pt picked up a 90 day supply on 8/24.

## 2022-04-30 ENCOUNTER — Telehealth: Payer: Self-pay

## 2022-04-30 ENCOUNTER — Encounter: Payer: Medicaid Other | Admitting: Internal Medicine

## 2022-04-30 ENCOUNTER — Other Ambulatory Visit: Payer: Self-pay | Admitting: *Deleted

## 2022-04-30 MED ORDER — OXYCODONE HCL 10 MG PO TABS: 10.0000 mg | ORAL_TABLET | Freq: Four times a day (QID) | ORAL | 0 refills | Status: DC | PRN

## 2022-04-30 NOTE — Telephone Encounter (Signed)
Last appointment 02/26/2022.  Last Tox[facility]ure 09/21/2021.  No future appointments.

## 2022-04-30 NOTE — Telephone Encounter (Signed)
Oxycodone HCl 10 MG TABS , [facility] @ [id number] [address]

## 2022-05-02 ENCOUNTER — Telehealth: Payer: Self-pay

## 2022-05-02 NOTE — Telephone Encounter (Signed)
Decision:Approved [facility] Key: [id number] - PA Case ID: [id number] - Rx #: [id number] Need help? Call us at [phone] Outcome Approvedtoday PA Case: [id number], Status: Approved, Coverage Starts on: 05/02/2022 12:00:00 AM, Coverage Ends on: 10/29/2022 12:00:00 AM. Drug oxyCODONE HCl '10MG'$  tablets Form [facility] Electronic [place] Form [phone] NCPDP) Original Claim Info 75 PLAN LIMITATION EXCEEDED 5 DAYS SUPPLY.CALL [phone] OR SUBMIT PA TO [email or web address] 3 DS O/R, USE [facility] [phone] MME > 50. CONSIDER DISPENSING NALOXONEDRUG REQUIRES

## 2022-05-02 NOTE — Telephone Encounter (Signed)
Prior Authorization for patient (oxycodone) came through on cover my meds was submitted with last office notes awaiting approval or denial 

## 2022-05-11 ENCOUNTER — Encounter (HOSPITAL_COMMUNITY): Payer: Medicaid Other | Attending: Internal Medicine

## 2022-05-15 ENCOUNTER — Ambulatory Visit: Payer: Medicaid Other | Admitting: Podiatry

## 2022-05-28 ENCOUNTER — Ambulatory Visit (INDEPENDENT_AMBULATORY_CARE_PROVIDER_SITE_OTHER): Payer: Medicaid Other | Admitting: Internal Medicine

## 2022-05-28 VITALS — BP 108/74 | HR 86 | Temp 97.9°F | Wt 156.0 lb

## 2022-05-28 DIAGNOSIS — Z7984 Long term (current) use of oral hypoglycemic drugs: Secondary | ICD-10-CM | POA: Diagnosis not present

## 2022-05-28 DIAGNOSIS — E113399 Type 2 diabetes mellitus with moderate nonproliferative diabetic retinopathy without macular edema, unspecified eye: Secondary | ICD-10-CM | POA: Diagnosis not present

## 2022-05-28 DIAGNOSIS — G8929 Other chronic pain: Secondary | ICD-10-CM | POA: Diagnosis not present

## 2022-05-28 DIAGNOSIS — Z79891 Long term (current) use of opiate analgesic: Secondary | ICD-10-CM

## 2022-05-28 DIAGNOSIS — Z23 Encounter for immunization: Secondary | ICD-10-CM

## 2022-05-28 DIAGNOSIS — Z794 Long term (current) use of insulin: Secondary | ICD-10-CM

## 2022-05-28 DIAGNOSIS — Z0000 Encounter for general adult medical examination without abnormal findings: Secondary | ICD-10-CM

## 2022-05-28 DIAGNOSIS — M5442 Lumbago with sciatica, left side: Secondary | ICD-10-CM

## 2022-05-28 DIAGNOSIS — R42 Dizziness and giddiness: Secondary | ICD-10-CM

## 2022-05-28 DIAGNOSIS — M5441 Lumbago with sciatica, right side: Secondary | ICD-10-CM | POA: Diagnosis present

## 2022-05-28 LAB — GLUCOSE, CAPILLARY: Glucose-Capillary: 206 mg/dL — ABNORMAL HIGH (ref 70–99)

## 2022-05-28 LAB — POCT GLYCOSYLATED HEMOGLOBIN (HGB A1C): Hemoglobin A1C: 8.8 % — AB (ref 4.0–5.6)

## 2022-05-28 MED ORDER — OXYCODONE HCL 10 MG PO TABS: 10.0000 mg | ORAL_TABLET | Freq: Four times a day (QID) | ORAL | 0 refills | Status: DC | PRN

## 2022-05-28 MED ORDER — LIRAGLUTIDE 18 MG/3ML ~~LOC~~ SOPN: 0.6000 mg | PEN_INJECTOR | Freq: Every day | SUBCUTANEOUS | 3 refills | Status: DC

## 2022-05-28 NOTE — Progress Notes (Unsigned)
Subjective:  CC: follow-up for chronic pain syndrome, diabetes, and dizziness  HPI:  [name] is a 61 y.o. female with a past medical history stated below and presents today for chronic pain syndrome diabetes and a month history of dizziness. Please see problem based assessment and plan for additional details.  Past Medical History:  Diagnosis Date   Adhesive capsulitis of right shoulder    with underlying tendinopathy rotator cuff   Arthritis    Diabetes mellitus    oral tx   Fibromyalgia    GERD (gastroesophageal reflux disease)    History of post-sterilization tuboplasty 2000   Plantar fasciitis    Right   Shortness of breath    with exertion   Sleep apnea 5 plus yrs   study -pt could not sleep test inconclusive.    Tear of meniscus of left knee    x2   Tear of meniscus of right knee     Current Outpatient Medications on File Prior to Visit  Medication Sig Dispense Refill   Blood Glucose Monitoring Suppl (ACCU-CHEK GUIDE ME) w/Device KIT 1 EACH BY DOES NOT APPLY ROUTE 3 (THREE) TIMES DAILY. 1 kit 1   celecoxib (CELEBREX) 100 MG capsule TAKE 1 CAPSULE BY MOUTH EVERY DAY 30 capsule 2   pantoprazole (PROTONIX) 40 MG tablet TAKE 1 TABLET BY MOUTH EVERY DAY 90 tablet 1   ACCU-CHEK FASTCLIX LANCETS MISC Check three times a day to check blood sugar. 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 insulin dependent 306 each 1   atorvastatin (LIPITOR) 20 MG tablet Take 1 tablet (20 mg total) by mouth daily. 90 tablet 1   cetirizine (ZYRTEC) 10 MG tablet Take 1 tablet (10 mg total) by mouth daily. 90 tablet 3   cyclobenzaprine (FLEXERIL) 10 MG tablet Take 1 tablet (10 mg total) by mouth 2 (two) times daily as needed for muscle spasms. 60 tablet 3   Diclofenac Sodium 3 % GEL Apply 2 g topically 3 (three) times daily. 100 g 4   glucose blood (ACCU-CHEK GUIDE) test strip Check blood sugar three times/day. 100 strip 9   glycopyrrolate (ROBINUL) 1 MG tablet TAKE 1 TABLET BY MOUTH TWICE A DAY 180  tablet 1   insulin glargine (LANTUS SOLOSTAR) 100 UNIT/ML Solostar Pen INJECT 15 UNITS INTO THE SKIN DAILY. 15 mL 1   Insulin Pen Needle (PEN NEEDLES) 32G X 4 MM MISC 1 each by Does not apply route 5 (five) times daily. 390 each 1   JARDIANCE 25 MG TABS tablet TAKE 1 TABLET BY MOUTH DAILY BEFORE BREAKFAST. 90 tablet 1   oxybutynin (DITROPAN XL) 10 MG 24 hr tablet Take 1 tablet (10 mg total) by mouth daily. 30 tablet 2   No current facility-administered medications on file prior to visit.    Family History  Problem Relation Age of Onset   Breast cancer Sister        both sisters have breast cancer    Social History   Socioeconomic History   Marital status: Married    Spouse name: Not on file   Number of children: Not on file   Years of education: GED +1 yr   Highest education level: Not on file  Occupational History   Occupation: unemployed    Employer: UNEMPLOYED  Tobacco Use   Smoking status: Never   Smokeless tobacco: Never  Vaping Use   Vaping Use: Never used  Substance and Sexual Activity   Alcohol use: No    Alcohol/week: 0.0 standard  drinks of alcohol   Drug use: No   Sexual activity: Not on file  Other Topics Concern   Not on file  Social History Narrative   Married, unemployed, Husband disabled and paraplegic 2/2 fall from tree stand while deer hunting, Son quadriplegic 2/2 MVA 05/2007 in [place] Strain: Not on file  Food Insecurity: Not on file  Transportation Needs: Not on file  Physical Activity: Not on file  Stress: Not on file  Social Connections: Not on file  Intimate Partner Violence: Not on file    Review of Systems: ROS negative except for what is noted on the assessment and plan.  Objective:   Vitals:   05/28/22 1112  BP: 108/74  Pulse: 86  Temp: 97.9 F (36.6 C)  TempSrc: Oral  SpO2: 99%  Weight: 156 lb (70.8 kg)    Physical Exam: Constitutional:  well-appearing Cardiovascular: regular rate and rhythm, no m/r/g Pulmonary/Chest: normal work of breathing on room air, lungs clear to auscultation bilaterally Abdominal: soft, non-tender, non-distended MSK: non antalgic gait Skin: warm and dry     Assessment & Plan:  DM2 (diabetes mellitus, type 2) ([place]) Patient following up for diabetes, last A1c at 10.9 in 8/23. This improved to 8.8 in clinic.  She was started on mealtime insulin in addition to her long-acting insulin.  She did not start mealtime insulin as she was concerned about low blood sugars.  Instead she takes between 15 to 30 units of Lantus nightly.  She titrates her long-acting insulin based off of blood sugars at night.  Bring her glucometer today, but average fasting glucose between 90-120.  She is also been taking Jardiance 25 mg daily. Assessment: She reports that the before A1c checked in August, she was in [place] and was not taking medications as she ran out of them.  A1c is better controlled at 8.8 but not at goal of less than 7. Plan: Continue Lantus 20 units Continue Jardiance 25 mg Restart Victoza 0.6 mg daily She has previously taken metformin and had side effects to this.  She also took glucose in the past and tolerated this medication well but it was discontinued as she had some weight loss.  Think that weight loss was ketosis from uncontrolled blood sugars.  Hemoglobin A1c is better controlled now and she is interested in restarting a GLP-1.  Healthcare maintenance Flu shot given.  Long-term current use of opiate analgesic for osteoarthritis and fibromyalgia Patient has been receiving oxycodone 10 mg q6hr PRN 120/ month, flexeril due to OA and fibromyalgia. Medications bring pain to tolerable level to where she can complete ADLs. Last UDS 3/22 appropriate, she is due for repeat in clinic. PDMP reviewed and appropriate. Pain contract updated 1/23. P: Tox[facility]ure Oxycodone 10 mg 120 sent Renew pain contract  1/24.  Addendum: She did not leave urine sample. I called and spoke with patient after she left and she states she forgot and would stop by Tuesday or Wednesday to provide this.  Dizziness Presenting with symptoms of dizziness for several months. Dizziness is present 10-12 times weekly when she stands up and resolves on its own after a few seconds. On chart review she has history of orthostatic hypotension and is currently taking oxybutynin and jardiance. Oxybutynin was restarted 8/23 however she reports symptoms prior to this. Lying 130/83 HR 74 Sitting 130/83 HR 88 Standing 106/75 HR 93 P: Orthostatics +ve with systolic dropping >59 mmHg. I talked with her  about increasing fluid to see if symptoms improve. F/u in 2 weeks, if symptoms persist will recheck orthostatics. If remain positive, I would consider discontinuing oxybutynin.  Patient discussed with Dr. Ennis Forts Meghan Tiemann, D.O. [facility]  PGY-2 Pager: [phone]  Phone: [phone] Date 05/29/2022  Time 1:24 PM

## 2022-05-28 NOTE — Patient Instructions (Addendum)
Thank you, [name] for allowing [name] to provide your care today.   Dizziness Your blood pressure dropped which could explain your symptoms. Please drink 4-5 bottles of water daily and come back in 2 weeks to see if your symptoms are better.  Diabetes Take Lantus 20 units nightly. Please bring in glucometer at follow-up visit. Continue insulin, Jardiance, and we can restart Victoza. I have referred you to ophthalmology.  Pain medications We repeated the urine test today. I have sent in a refill of your pain medication.   I have ordered the following labs for you:   Lab Orders         ToxAssure Select,+Antidepr,UR         Glucose, capillary         POC Hbg A1C      Referrals ordered today:    Referral Orders         Ambulatory referral to Ophthalmology      I have ordered the following medication/changed the following medications:   Stop the following medications: There are no discontinued medications.   Start the following medications: No orders of the defined types were placed in this encounter.    Follow up:  2 weeks    We look forward to seeing you next time. Please call our clinic at [phone] if you have any questions or concerns. The best time to call is Monday-Friday from 9am-4pm, but there is someone available 24/7. If after hours or the weekend, call the main hospital number and ask for the Internal Medicine Resident On-Call. If you need medication refills, please notify your pharmacy one week in advance and they will send [name] a request.   Thank you for trusting me with your care. Wishing you the best!   Christiana Fuchs, [place]

## 2022-05-29 NOTE — Assessment & Plan Note (Addendum)
Presenting with symptoms of dizziness for several months. Dizziness is present 10-12 times weekly when she stands up and resolves on its own after a few seconds. On chart review she has history of orthostatic hypotension and is currently taking oxybutynin and jardiance. Oxybutynin was restarted 8/23 however she reports symptoms prior to this. Lying 130/83 HR 74 Sitting 130/83 HR 88 Standing 106/75 HR 93 P: Orthostatics +ve with systolic dropping >95 mmHg. I talked with her about increasing fluid to see if symptoms improve. F/u in 2 weeks, if symptoms persist will recheck orthostatics. If remain positive, I would consider discontinuing oxybutynin.

## 2022-05-29 NOTE — Assessment & Plan Note (Signed)
Patient following up for diabetes, last A1c at 10.9 in 8/23. This improved to 8.8 in clinic.  She was started on mealtime insulin in addition to her long-acting insulin.  She did not start mealtime insulin as she was concerned about low blood sugars.  Instead she takes between 15 to 30 units of Lantus nightly.  She titrates her long-acting insulin based off of blood sugars at night.  Bring her glucometer today, but average fasting glucose between 90-120.  She is also been taking Jardiance 25 mg daily. Assessment: She reports that the before A1c checked in August, she was in [place] and was not taking medications as she ran out of them.  A1c is better controlled at 8.8 but not at goal of less than 7. Plan: Continue Lantus 20 units Continue Jardiance 25 mg Restart Victoza 0.6 mg daily She has previously taken metformin and had side effects to this.  She also took glucose in the past and tolerated this medication well but it was discontinued as she had some weight loss.  Think that weight loss was ketosis from uncontrolled blood sugars.  Hemoglobin A1c is better controlled now and she is interested in restarting a GLP-1.

## 2022-05-29 NOTE — Assessment & Plan Note (Signed)
Patient has been receiving oxycodone 10 mg q6hr PRN 120/ month, flexeril due to OA and fibromyalgia. Medications bring pain to tolerable level to where she can complete ADLs. Last UDS 3/22 appropriate, she is due for repeat in clinic. PDMP reviewed and appropriate. Pain contract updated 1/23. P: Tox[facility]ure Oxycodone 10 mg 120 sent Renew pain contract 1/24.  Addendum: She did not leave urine sample. I called and spoke with patient after she left and she states she forgot and would stop by Tuesday or Wednesday to provide this.

## 2022-05-29 NOTE — Assessment & Plan Note (Signed)
Flu shot given

## 2022-05-31 NOTE — Progress Notes (Signed)
Internal Medicine Clinic Attending  Case discussed with Dr. Masters  At the time of the visit.  We reviewed the resident's history and exam and pertinent patient test results.  I agree with the assessment, diagnosis, and plan of care documented in the resident's note.  

## 2022-06-01 ENCOUNTER — Telehealth: Payer: Self-pay | Admitting: Internal Medicine

## 2022-06-01 NOTE — Telephone Encounter (Signed)
Call place to patient. She needs to come in for toxassure after not completing at last office visit. She is currently at [facility] with her husband who is being admitted. She plans to come by clinic on Monday to provide urine sample.

## 2022-06-04 ENCOUNTER — Other Ambulatory Visit: Payer: Medicaid Other

## 2022-06-11 ENCOUNTER — Telehealth: Payer: Self-pay | Admitting: Internal Medicine

## 2022-06-11 NOTE — Telephone Encounter (Signed)
Called [name] today to discuss need for urine testing for continued prescription of medication as outlined in controlled medication agreement. She expressed understanding and reports she will try to come in tomorrow to have this done. I have asked that she complete this testing in the next two weeks to which she said she understood.

## 2022-06-15 ENCOUNTER — Telehealth: Payer: Self-pay

## 2022-06-15 NOTE — Telephone Encounter (Signed)
Pt states she will not come today for urine screening. She will be here on Monday.

## 2022-06-20 NOTE — Telephone Encounter (Signed)
Returned call to [name] as she did not come in on Monday for lab test. She reports she is recovering from the flu and her husband's Lucianne Lei broke down yesterday. We again discussed the importance of this test and need to have it done this week. She expressed understanding and will come in before the week is over.

## 2022-07-05 ENCOUNTER — Other Ambulatory Visit: Payer: Medicaid Other

## 2022-07-05 ENCOUNTER — Other Ambulatory Visit: Payer: Self-pay | Admitting: *Deleted

## 2022-07-05 DIAGNOSIS — G8929 Other chronic pain: Secondary | ICD-10-CM

## 2022-07-05 MED ORDER — OXYCODONE HCL 10 MG PO TABS: 10.0000 mg | ORAL_TABLET | Freq: Four times a day (QID) | ORAL | 0 refills | Status: DC | PRN

## 2022-07-05 NOTE — Telephone Encounter (Signed)
Last rx written 05/28/22. Last OV 11/13 with Dr Howie Ill. Next OV 1/22/240 TOX - collected today.

## 2022-07-08 LAB — TOXASSURE SELECT,+ANTIDEPR,UR

## 2022-08-06 ENCOUNTER — Encounter: Payer: Medicaid Other | Admitting: Internal Medicine

## 2022-08-13 ENCOUNTER — Other Ambulatory Visit: Payer: Self-pay | Admitting: Internal Medicine

## 2022-08-13 DIAGNOSIS — G8929 Other chronic pain: Secondary | ICD-10-CM

## 2022-08-13 MED ORDER — OXYCODONE HCL 10 MG PO TABS: 10.0000 mg | ORAL_TABLET | Freq: Four times a day (QID) | ORAL | 0 refills | Status: DC | PRN

## 2022-08-13 NOTE — Telephone Encounter (Signed)
MED REFILL REQUEST  Oxycodone HCl 10 MG TABS    [id number] [place], [place] - [address] Phone: [phone] Fax: [phone]

## 2022-08-13 NOTE — Telephone Encounter (Signed)
Will need 22-monthf/u appointment 08/2022 for further prescriptions.

## 2022-08-27 ENCOUNTER — Telehealth: Payer: Self-pay

## 2022-08-27 NOTE — Telephone Encounter (Signed)
Prior Authorization for patient (latus solostar) came through on cover my meds was submitted, per cover my meds Available without authorization.

## 2022-09-06 ENCOUNTER — Other Ambulatory Visit: Payer: Self-pay

## 2022-09-06 ENCOUNTER — Ambulatory Visit: Payer: Medicaid Other | Admitting: Internal Medicine

## 2022-09-06 ENCOUNTER — Encounter: Payer: Self-pay | Admitting: Internal Medicine

## 2022-09-06 VITALS — BP 148/82 | HR 82 | Temp 98.8°F | Resp 28 | Ht 62.0 in | Wt 156.1 lb

## 2022-09-06 DIAGNOSIS — R7401 Elevation of levels of liver transaminase levels: Secondary | ICD-10-CM

## 2022-09-06 DIAGNOSIS — E113399 Type 2 diabetes mellitus with moderate nonproliferative diabetic retinopathy without macular edema, unspecified eye: Secondary | ICD-10-CM | POA: Diagnosis not present

## 2022-09-06 DIAGNOSIS — E113393 Type 2 diabetes mellitus with moderate nonproliferative diabetic retinopathy without macular edema, bilateral: Secondary | ICD-10-CM | POA: Diagnosis present

## 2022-09-06 DIAGNOSIS — E785 Hyperlipidemia, unspecified: Secondary | ICD-10-CM

## 2022-09-06 DIAGNOSIS — Z79891 Long term (current) use of opiate analgesic: Secondary | ICD-10-CM | POA: Diagnosis not present

## 2022-09-06 DIAGNOSIS — Z794 Long term (current) use of insulin: Secondary | ICD-10-CM

## 2022-09-06 DIAGNOSIS — R634 Abnormal weight loss: Secondary | ICD-10-CM

## 2022-09-06 DIAGNOSIS — R42 Dizziness and giddiness: Secondary | ICD-10-CM

## 2022-09-06 DIAGNOSIS — Z7984 Long term (current) use of oral hypoglycemic drugs: Secondary | ICD-10-CM | POA: Diagnosis not present

## 2022-09-06 MED ORDER — LANTUS SOLOSTAR 100 UNIT/ML ~~LOC~~ SOPN: PEN_INJECTOR | SUBCUTANEOUS | 1 refills | Status: DC

## 2022-09-06 NOTE — Patient Instructions (Addendum)
Thank you, Ms.Asley Maring for allowing [name] to provide your care today.   Diabetes/cholesterol I will call with lab work. Please take 16 units of Lantus nightly and do not change dosing based off of blood sugars. Bring in glucometer at follow-up in 4 weeks. Continue taking jardiance and victoza.  Dizziness/ night sweats I would like to get a lab that you need to come in at 8 AM for. This is to make sure your body is making enough hormones to keep blood pressure up. Continue to drink 2 L of water at least. With the history of vertigo I am referring you to physical therapy.   I have ordered the following labs for you:   Lab Orders         Lipid Profile         POC Hbg A1C       Referrals ordered today:   Referral Orders  No referral(s) requested today     I have ordered the following medication/changed the following medications:   Stop the following medications: Medications Discontinued During This Encounter  Medication Reason   glycopyrrolate (ROBINUL) 1 MG tablet      Start the following medications: No orders of the defined types were placed in this encounter.    Follow up: 4 weeks for chronic pain medication visit with urine drug screen.   We look forward to seeing you next time. Please call our clinic at [phone] if you have any questions or concerns. The best time to call is Monday-Friday from 9am-4pm, but there is someone available 24/7. If after hours or the weekend, call the main hospital number and ask for the Internal Medicine Resident On-Call. If you need medication refills, please notify your pharmacy one week in advance and they will send [name] a request.   Thank you for trusting me with your care. Wishing you the best!   Christiana Fuchs, [place]

## 2022-09-06 NOTE — Progress Notes (Signed)
Subjective:  CC: follow-up for diabetes, dizziness  HPI:  [name] is a 62 y.o. female with a past medical history stated below and presents today for follow-up on chronic conditions.  She feels like she continues to lose weight over the past 1.5 years. She is also having difficulty with dizziness for several years. Please see problem based assessment and plan for additional details.  Past Medical History:  Diagnosis Date   Adhesive capsulitis of right shoulder    with underlying tendinopathy rotator cuff   Arthritis    Diabetes mellitus    oral tx   Fibromyalgia    GERD (gastroesophageal reflux disease)    History of post-sterilization tuboplasty 2000   Plantar fasciitis    Right   Shortness of breath    with exertion   Sleep apnea 5 plus yrs   study -pt could not sleep test inconclusive.    Tear of meniscus of left knee    x2   Tear of meniscus of right knee     Current Outpatient Medications on File Prior to Visit  Medication Sig Dispense Refill   Blood Glucose Monitoring Suppl (ACCU-CHEK GUIDE ME) w/Device KIT 1 EACH BY DOES NOT APPLY ROUTE 3 (THREE) TIMES DAILY. 1 kit 1   celecoxib (CELEBREX) 100 MG capsule TAKE 1 CAPSULE BY MOUTH EVERY DAY 30 capsule 2   pantoprazole (PROTONIX) 40 MG tablet TAKE 1 TABLET BY MOUTH EVERY DAY 90 tablet 1   ACCU-CHEK FASTCLIX LANCETS MISC Check three times a day to check blood sugar. 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 insulin dependent 306 each 1   atorvastatin (LIPITOR) 20 MG tablet Take 1 tablet (20 mg total) by mouth daily. 90 tablet 1   cetirizine (ZYRTEC) 10 MG tablet Take 1 tablet (10 mg total) by mouth daily. 90 tablet 3   cyclobenzaprine (FLEXERIL) 10 MG tablet Take 1 tablet (10 mg total) by mouth 2 (two) times daily as needed for muscle spasms. 60 tablet 3   Diclofenac Sodium 3 % GEL Apply 2 g topically 3 (three) times daily. 100 g 4   glucose blood (ACCU-CHEK GUIDE) test strip Check blood sugar three times/day. 100 strip 9    Insulin Pen Needle (PEN NEEDLES) 32G X 4 MM MISC 1 each by Does not apply route 5 (five) times daily. 390 each 1   JARDIANCE 25 MG TABS tablet TAKE 1 TABLET BY MOUTH DAILY BEFORE BREAKFAST. 90 tablet 1   liraglutide (VICTOZA) 18 MG/3ML SOPN Inject 0.6 mg into the skin daily. 9 mL 3   Oxycodone HCl 10 MG TABS Take 1 tablet (10 mg total) by mouth every 6 (six) hours as needed. 120 tablet 0   No current facility-administered medications on file prior to visit.    Family History  Problem Relation Age of Onset   Breast cancer Sister        both sisters have breast cancer    Social History   Socioeconomic History   Marital status: Married    Spouse name: Not on file   Number of children: Not on file   Years of education: GED +1 yr   Highest education level: Not on file  Occupational History   Occupation: unemployed    Employer: UNEMPLOYED  Tobacco Use   Smoking status: Never   Smokeless tobacco: Never  Vaping Use   Vaping Use: Never used  Substance and Sexual Activity   Alcohol use: No    Alcohol/week: 0.0 standard drinks of alcohol  Drug use: No   Sexual activity: Not on file  Other Topics Concern   Not on file  Social History Narrative   Married, unemployed, Husband disabled and paraplegic 2/2 fall from tree stand while deer hunting, Son quadriplegic 2/2 MVA 05/2007 in [place] Strain: Not on file  Food Insecurity: No Food Insecurity (09/06/2022)   Hunger Vital Sign    Worried About Running Out of Food in the Last Year: Never true    Ran Out of Food in the Last Year: Never true  Transportation Needs: No Transportation Needs (09/06/2022)   PRAPARE - Hydrologist (Medical): No    Lack of Transportation (Non-Medical): No  Physical Activity: Not on file  Stress: Not on file  Social Connections: Unknown (09/06/2022)   Social Connection and Isolation Panel [NHANES]    Frequency of  Communication with Friends and Family: More than three times a week    Frequency of Social Gatherings with Friends and Family: More than three times a week    Attends Religious Services: Not on Diplomatic Services operational officer of Clubs or Organizations: No    Attends Archivist Meetings: Never    Marital Status: Married  Human resources officer Violence: Not At Risk (09/06/2022)   Humiliation, Afraid, Rape, and Kick questionnaire    Fear of Current or Ex-Partner: No    Emotionally Abused: No    Physically Abused: No    Sexually Abused: No    Review of Systems: ROS negative except for what is noted on the assessment and plan.  Objective:   Vitals:   09/06/22 1050 09/06/22 1127 09/06/22 1129 09/06/22 1133  BP: 136/69 (!) 157/73 (!) 168/82 (!) 148/82  Pulse: 72 68 71 82  Resp: (!) 28     Temp: 98.8 F (37.1 C)     TempSrc: Oral     SpO2: 99%     Weight: 156 lb 1.6 oz (70.8 kg)     Height: '5\' 2"'$  (1.575 m)       Physical Exam: Constitutional: well-appearing Cardiovascular: regular rate and rhythm, no m/r/g Pulmonary/Chest: normal work of breathing on room air, lungs clear to auscultation bilaterally MSK: normal bulk and tone Neurological: alert & oriented x 3, normal gait Skin: warm and dry   Assessment & Plan:  DM2 (diabetes mellitus, type 2) (HCC) She has been trying to help her husband gain weight so has been eating a lot of peanut butter in the last few months. She also switched coke products to pineapple juice. Medications include lantus which she titrates from 15units to 25 units nightly, jardiance 25 mg, and victoza 0.6 mg qd. She has a low blood sugar in the 40s in the middle of the night last week which caused her to wake up. A1c increased 8.8 in 11/23 to 9. She denies polyuria, but does have polydipsia. P: I am concerned that she is injecting to much insulin on occasion so talked with her about taking stable doses. -Lantus 16 units -continue jardiance 25 mg and victoza 0.6  mg (with weight loss do not want to increase) -A1c -follow-up in 4 weeks  Dizziness She continues to have dizziness on most days.  She has this happen with standing up on occasions but also has sudden onset dizziness with certain motions of her head. She drinks over a gallon of water daily. Medication wise she continues to take oxycodone 10 mg 4x  daily and feels that she cannot decrease dose. When she goes longer without medications she has severe pain and sweating. She rarely takes cyclobenzaprine. Orthostatic vitals were positive with systilic dropping from [id number] with laying down to 148 when standing.  She is on several medications that could affect her blood pressure. In the last 2 months she has had nights sweats and in last year unintentional weight loss. Differentials includes addisons. I also think that with prior history of BPPV that her symptoms could be multifactorial. P: -referral to PT for vestibular rehab -8am cortisol, message sent to front desk to help schedule lab only visit  Transaminitis Patient with unintentional weight loss of about 20 lbs in last 1.5 years. Alk phos elevated at 140 5/23. Repeat CMP with Alk phos 152 abd AST/ ALT at 48/53 consistent with mild elevation. Differentials include NAFLD, viral infection. Last hepatitis c was negative 6 years ago. -CMP completed -repeat Hepatitis C at follow-up  Weight loss She continues to have nonspecific symptoms including unintentional weight loss in last 1.5 years of about 15 lbs, nights sweats for last 2 months, chronic pain, fatigue, dizziness. Prior work-up with ANA, TSH, CRP were negative. She has not been able to complete gastric emptying study as she was told she could not take pain medications in the day leading up to this. She has never smoked and did not follow-up with GI for EGD/ colonoscopy in the past. P: -reordered CT Chest/abd/pelvis w contrast  Long-term current use of opiate analgesic for osteoarthritis and  fibromyalgia Follow-up in 4 weeks for UDS. Of note, it appears that she has established at a chronic pain management clinic with Dr. Danne Baxter on 3/24. Will need to clarify if she wants to continue pain contract with [facility].  Hyperlipidemia LDL at goal for primary prevention. Continue atorvastatin 20 mg. Lipid Panel     Component Value Date/Time   CHOL 168 09/06/2022 1210   TRIG 139 09/06/2022 1210   HDL 68 09/06/2022 1210   CHOLHDL 2.5 09/06/2022 1210   CHOLHDL 2.9 07/02/2014 1600   VLDL 27 07/02/2014 1600   LDLCALC 76 09/06/2022 1210   LABVLDL 24 09/06/2022 1210      Patient discussed with Dr. Erroll Luna Humberto Addo, D.O. [facility]  PGY-2 Pager: [phone]  Phone: [phone] Date 09/08/2022  Time 10:23 AM

## 2022-09-07 ENCOUNTER — Other Ambulatory Visit: Payer: Self-pay | Admitting: Internal Medicine

## 2022-09-07 LAB — SPECIMEN STATUS REPORT

## 2022-09-07 LAB — HEMOGLOBIN A1C
Est. average glucose Bld gHb Est-mCnc: 212 mg/dL
Hgb A1c MFr Bld: 9 % — ABNORMAL HIGH (ref 4.8–5.6)

## 2022-09-08 DIAGNOSIS — R7401 Elevation of levels of liver transaminase levels: Secondary | ICD-10-CM | POA: Insufficient documentation

## 2022-09-08 LAB — CBC

## 2022-09-08 NOTE — Assessment & Plan Note (Signed)
She continues to have nonspecific symptoms including unintentional weight loss in last 1.5 years of about 15 lbs, nights sweats for last 2 months, chronic pain, fatigue, dizziness. Prior work-up with ANA, TSH, CRP were negative. She has not been able to complete gastric emptying study as she was told she could not take pain medications in the day leading up to this. She has never smoked and did not follow-up with GI for EGD/ colonoscopy in the past. P: -reordered CT Chest/abd/pelvis w contrast

## 2022-09-08 NOTE — Assessment & Plan Note (Addendum)
She has been trying to help her husband gain weight so has been eating a lot of peanut butter in the last few months. She also switched coke products to pineapple juice. Medications include lantus which she titrates from 15units to 25 units nightly, jardiance 25 mg, and victoza 0.6 mg qd. She has a low blood sugar in the 40s in the middle of the night last week which caused her to wake up. A1c increased 8.8 in 11/23 to 9. She denies polyuria, but does have polydipsia. P: I am concerned that she is injecting to much insulin on occasion so talked with her about taking stable doses. -Lantus 16 units -continue jardiance 25 mg and victoza 0.6 mg (with weight loss do not want to increase) -A1c -follow-up in 4 weeks

## 2022-09-08 NOTE — Assessment & Plan Note (Signed)
She continues to have dizziness on most days.  She has this happen with standing up on occasions but also has sudden onset dizziness with certain motions of her head. She drinks over a gallon of water daily. Medication wise she continues to take oxycodone 10 mg 4x daily and feels that she cannot decrease dose. When she goes longer without medications she has severe pain and sweating. She rarely takes cyclobenzaprine. Orthostatic vitals were positive with systilic dropping from [id number] with laying down to 148 when standing.  She is on several medications that could affect her blood pressure. In the last 2 months she has had nights sweats and in last year unintentional weight loss. Differentials includes addisons. I also think that with prior history of BPPV that her symptoms could be multifactorial. P: -referral to PT for vestibular rehab -8am cortisol, message sent to front desk to help schedule lab only visit

## 2022-09-08 NOTE — Assessment & Plan Note (Signed)
Patient with unintentional weight loss of about 20 lbs in last 1.5 years. Alk phos elevated at 140 5/23. Repeat CMP with Alk phos 152 abd AST/ ALT at 48/53 consistent with mild elevation. Differentials include NAFLD, viral infection. Last hepatitis c was negative 6 years ago. -CMP completed -repeat Hepatitis C at follow-up

## 2022-09-08 NOTE — Assessment & Plan Note (Signed)
Follow-up in 4 weeks for UDS. Of note, it appears that she has established at a chronic pain management clinic with Dr. Danne Baxter on 3/24. Will need to clarify if she wants to continue pain contract with [facility].

## 2022-09-08 NOTE — Assessment & Plan Note (Signed)
LDL at goal for primary prevention. Continue atorvastatin 20 mg. Lipid Panel     Component Value Date/Time   CHOL 168 09/06/2022 1210   TRIG 139 09/06/2022 1210   HDL 68 09/06/2022 1210   CHOLHDL 2.5 09/06/2022 1210   CHOLHDL 2.9 07/02/2014 1600   VLDL 27 07/02/2014 1600   LDLCALC 76 09/06/2022 1210   LABVLDL 24 09/06/2022 1210

## 2022-09-09 LAB — CMP14 + ANION GAP
ALT: 53 IU/L — ABNORMAL HIGH (ref 0–32)
AST: 48 IU/L — ABNORMAL HIGH (ref 0–40)
Albumin/Globulin Ratio: 1.8 (ref 1.2–2.2)
Albumin: 4.9 g/dL (ref 3.8–4.9)
Alkaline Phosphatase: 152 IU/L — ABNORMAL HIGH (ref 44–121)
Anion Gap: 18 mmol/L (ref 10.0–18.0)
BUN/Creatinine Ratio: 21 (ref 12–28)
BUN: 13 mg/dL (ref 8–27)
Bilirubin Total: 0.4 mg/dL (ref 0.0–1.2)
CO2: 20 mmol/L (ref 20–29)
Calcium: 10 mg/dL (ref 8.7–10.3)
Chloride: 100 mmol/L (ref 96–106)
Creatinine, Ser: 0.63 mg/dL (ref 0.57–1.00)
Globulin, Total: 2.7 g/dL (ref 1.5–4.5)
Glucose: 249 mg/dL — ABNORMAL HIGH (ref 70–99)
Potassium: 4.3 mmol/L (ref 3.5–5.2)
Sodium: 138 mmol/L (ref 134–144)
Total Protein: 7.6 g/dL (ref 6.0–8.5)
eGFR: 101 mL/min/{1.73_m2} (ref >59)

## 2022-09-09 LAB — LIPID PANEL
Chol/HDL Ratio: 2.5 ratio (ref 0.0–4.4)
Cholesterol, Total: 168 mg/dL (ref 100–199)
HDL: 68 mg/dL (ref >39)
LDL Chol Calc (NIH): 76 mg/dL (ref 0–99)
Triglycerides: 139 mg/dL (ref 0–149)
VLDL Cholesterol Cal: 24 mg/dL (ref 5–40)

## 2022-09-09 LAB — CBC
Hematocrit: 49.5 % — ABNORMAL HIGH (ref 34.0–46.6)
Hemoglobin: 16 g/dL — ABNORMAL HIGH (ref 11.1–15.9)
MCH: 29.4 pg (ref 26.6–33.0)
MCHC: 32.3 g/dL (ref 31.5–35.7)
MCV: 91 fL (ref 79–97)
Platelets: 276 10*3/uL (ref 150–450)
RBC: 5.44 x10E6/uL — ABNORMAL HIGH (ref 3.77–5.28)
RDW: 12.4 % (ref 11.7–15.4)
WBC: 10.3 10*3/uL (ref 3.4–10.8)

## 2022-09-11 NOTE — Telephone Encounter (Signed)
Called to review with [name] given recent visit for dizziness which oxybutynin may cause. She reports she trialed off this medication for several months with no improvement in dizziness and worsening of her urge incontinence. Will refill today. Also requesting refill of Celebrex for OA and fibromyalgia. Reviewed appropriate use and potential SE.

## 2022-09-14 NOTE — Progress Notes (Signed)
Internal Medicine Clinic Attending  Case discussed with Dr. Masters  At the time of the visit.  We reviewed the resident's history and exam and pertinent patient test results.  I agree with the assessment, diagnosis, and plan of care documented in the resident's note.  

## 2022-09-17 ENCOUNTER — Telehealth: Payer: Self-pay | Admitting: *Deleted

## 2022-09-17 NOTE — Telephone Encounter (Signed)
Called patient for lab only reminder. AM Cortisol needs to be collected between 0815 and 0830. Patient states husband is bedridden at this time and she is unable to leave him that early. If drawn after 0900 it would be considered a Random Cortisol.

## 2022-09-18 ENCOUNTER — Other Ambulatory Visit: Payer: Self-pay | Admitting: Internal Medicine

## 2022-09-18 ENCOUNTER — Other Ambulatory Visit: Payer: Self-pay | Admitting: *Deleted

## 2022-09-18 ENCOUNTER — Other Ambulatory Visit: Payer: Medicaid Other

## 2022-09-18 DIAGNOSIS — G8929 Other chronic pain: Secondary | ICD-10-CM

## 2022-09-18 DIAGNOSIS — M1991 Primary osteoarthritis, unspecified site: Secondary | ICD-10-CM

## 2022-09-18 DIAGNOSIS — G2581 Restless legs syndrome: Secondary | ICD-10-CM

## 2022-09-18 DIAGNOSIS — I951 Orthostatic hypotension: Secondary | ICD-10-CM

## 2022-09-18 DIAGNOSIS — R42 Dizziness and giddiness: Secondary | ICD-10-CM

## 2022-09-18 MED ORDER — OXYCODONE HCL 10 MG PO TABS: 10.0000 mg | ORAL_TABLET | Freq: Four times a day (QID) | ORAL | 0 refills | Status: DC | PRN

## 2022-09-18 MED ORDER — CELECOXIB 100 MG PO CAPS: 100.0000 mg | ORAL_CAPSULE | Freq: Every day | ORAL | 11 refills | Status: DC | PRN

## 2022-09-18 MED ORDER — CYCLOBENZAPRINE HCL 10 MG PO TABS: 10.0000 mg | ORAL_TABLET | Freq: Two times a day (BID) | ORAL | 3 refills | Status: DC | PRN

## 2022-09-18 NOTE — Telephone Encounter (Signed)
Pt calling back to f/u with the following refill. Pt states she has been waiting since her last OV on 09/06/2022   Disp Refills Start End   Oxycodone HCl 10 MG TABS 120 tablet 0 08/13/2022     [id number] [place], [place] - [address]

## 2022-09-18 NOTE — Telephone Encounter (Signed)
Pt called and informed a 24 hr urine can be done if she's able to collect the urine. She's ok with this. Informed she needs to come to our lab to pick up the jug and Olivia Mackie (she's aware) to give instructions.

## 2022-09-27 ENCOUNTER — Other Ambulatory Visit: Payer: Self-pay | Admitting: Internal Medicine

## 2022-09-27 DIAGNOSIS — R42 Dizziness and giddiness: Secondary | ICD-10-CM

## 2022-09-27 DIAGNOSIS — I951 Orthostatic hypotension: Secondary | ICD-10-CM

## 2022-09-28 ENCOUNTER — Ambulatory Visit (HOSPITAL_COMMUNITY): Payer: Medicaid Other | Attending: Internal Medicine

## 2022-10-18 ENCOUNTER — Encounter: Payer: Medicaid Other | Admitting: Student

## 2022-10-21 ENCOUNTER — Other Ambulatory Visit: Payer: Self-pay | Admitting: Internal Medicine

## 2022-10-21 DIAGNOSIS — E113393 Type 2 diabetes mellitus with moderate nonproliferative diabetic retinopathy without macular edema, bilateral: Secondary | ICD-10-CM

## 2022-10-21 DIAGNOSIS — J302 Other seasonal allergic rhinitis: Secondary | ICD-10-CM

## 2022-10-23 ENCOUNTER — Other Ambulatory Visit: Payer: Self-pay

## 2022-10-23 DIAGNOSIS — G8929 Other chronic pain: Secondary | ICD-10-CM

## 2022-10-23 MED ORDER — OXYCODONE HCL 10 MG PO TABS
10.0000 mg | ORAL_TABLET | Freq: Four times a day (QID) | ORAL | 0 refills | Status: DC | PRN
Start: 2022-10-23 — End: 2022-11-23

## 2022-10-31 ENCOUNTER — Ambulatory Visit (INDEPENDENT_AMBULATORY_CARE_PROVIDER_SITE_OTHER): Payer: Medicaid Other

## 2022-10-31 VITALS — BP 125/66 | HR 91 | Temp 98.3°F | Ht 62.0 in | Wt 157.1 lb

## 2022-10-31 DIAGNOSIS — M1990 Unspecified osteoarthritis, unspecified site: Secondary | ICD-10-CM | POA: Diagnosis not present

## 2022-10-31 DIAGNOSIS — M797 Fibromyalgia: Secondary | ICD-10-CM

## 2022-10-31 DIAGNOSIS — R7401 Elevation of levels of liver transaminase levels: Secondary | ICD-10-CM

## 2022-10-31 DIAGNOSIS — E785 Hyperlipidemia, unspecified: Secondary | ICD-10-CM

## 2022-10-31 DIAGNOSIS — H9193 Unspecified hearing loss, bilateral: Secondary | ICD-10-CM | POA: Diagnosis not present

## 2022-10-31 DIAGNOSIS — Z79891 Long term (current) use of opiate analgesic: Secondary | ICD-10-CM | POA: Diagnosis not present

## 2022-10-31 DIAGNOSIS — R0789 Other chest pain: Secondary | ICD-10-CM | POA: Diagnosis not present

## 2022-10-31 DIAGNOSIS — R42 Dizziness and giddiness: Secondary | ICD-10-CM | POA: Diagnosis not present

## 2022-10-31 DIAGNOSIS — Z0000 Encounter for general adult medical examination without abnormal findings: Secondary | ICD-10-CM

## 2022-10-31 NOTE — Patient Instructions (Signed)
[name], it was a pleasure seeing you today! You endorsed feeling well today. Below are some of the things we talked about this visit. We look forward to seeing you in the follow up appointment!  Today we discussed: Pain: We'll continue our current pain regimen. Sometimes flexeril becomes less effective after a couple weeks of taking it  They will call you to do hearing tests. They will call you to schedule the heart, liver and neck scans. You can get them all done at [facility].  I will call you with results.  I have ordered the following labs today:  Lab Orders         ToxAssure Select,+Antidepr,UR         Hepatitis C Ab reflex to Quant PCR         Hepatitis B Surface Antibody         Hepatitis B Surface Antigen         Hepatitis B core Ab, Total       Referrals ordered today:   Referral Orders         Ambulatory referral to Audiology       I have ordered the following medication/changed the following medications:   Stop the following medications: There are no discontinued medications.   Start the following medications: No orders of the defined types were placed in this encounter.    Follow-up: 2-3 months   Please make sure to arrive 15 minutes prior to your next appointment. If you arrive late, you may be asked to reschedule.   We look forward to seeing you next time. Please call our clinic at [phone] if you have any questions or concerns. The best time to call is Monday-Friday from 9am-4pm, but there is someone available 24/7. If after hours or the weekend, call the main hospital number and ask for the Internal Medicine Resident On-Call. If you need medication refills, please notify your pharmacy one week in advance and they will send [name] a request.  Thank you for letting us take part in your care. Wishing you the best!  Thank you, Lyndle Herrlich MD

## 2022-11-01 LAB — HEPATITIS B CORE ANTIBODY, TOTAL: Hep B Core Total Ab: NEGATIVE

## 2022-11-01 LAB — HEPATITIS B SURFACE ANTIGEN: Hepatitis B Surface Ag: NEGATIVE

## 2022-11-01 LAB — HEPATITIS B SURFACE ANTIBODY,QUALITATIVE: Hep B Surface Ab, Qual: NONREACTIVE

## 2022-11-01 LAB — HCV INTERPRETATION

## 2022-11-01 LAB — HCV AB W REFLEX TO QUANT PCR: HCV Ab: NONREACTIVE

## 2022-11-01 NOTE — Assessment & Plan Note (Signed)
On oxycodone 10 mg every 6 as needed as well as Flexeril 10 mg twice daily as needed, also has spinal procedures done in pain clinic.  Will check U tox today.

## 2022-11-01 NOTE — Assessment & Plan Note (Signed)
Previously noted worsening transaminitis.  She has no abdominal pain or organomegaly on exam.  Murphy sign negative.  Will obtain hepatitis labs and right upper quadrant ultrasound.  Of note, she does have a strong autoimmune history in her family.  I wonder if this is an autoimmune etiology such as autoimmune hepatitis, PBC, PSC.  If initial workup is overall unrevealing, can think of these alternatives and further workup.

## 2022-11-01 NOTE — Assessment & Plan Note (Signed)
Continues to have multifactorial dizziness.  Description seems orthostatic in nature, and she actually measures her blood pressure at home and notes that it drops when she goes from sitting to standing.  She does drink plenty of water.  She is on several medications that have previously been tried to be weaned, but restarted because of medical necessity.  She had prior workup submitted to look for adrenal insufficiency, but she has not completed this yet.  Additionally, she does note occasional episodes of previous syncope and chest discomfort.  She says that she becomes dyspneic and has chest discomfort when she exerts herself.  Cardiac exam is unremarkable, but I do hear a carotid bruit. - Continue encouraging good p.o. hydration - Encouraged her to complete workup for adrenal insufficiency.  Sent her home with cortisol swab. - Coronary CTA - Carotid Doppler

## 2022-11-01 NOTE — Assessment & Plan Note (Signed)
Requests audiology referral as she feels like her hearing is getting worse.  Of note, given her dizziness as well if she does actually have hearing loss, can consider vestibular etiology such as Mnire's or other central source.

## 2022-11-01 NOTE — Progress Notes (Signed)
   CC: Routine follow-up  HPI:  [name] is a 62 y.o.-year-old female with past medical history as below presenting for routine follow-up.  Please see encounters tab for problem-based charting.  Past Medical History:  Diagnosis Date   Adhesive capsulitis of right shoulder    with underlying tendinopathy rotator cuff   Arthritis    Diabetes mellitus    oral tx   Fibromyalgia    GERD (gastroesophageal reflux disease)    History of post-sterilization tuboplasty 2000   Plantar fasciitis    Right   Shortness of breath    with exertion   Sleep apnea 5 plus yrs   study -pt could not sleep test inconclusive.    Tear of meniscus of left knee    x2   Tear of meniscus of right knee    Review of Systems: As in HPI.  Please see encounters tab for problem based charting.  Physical Exam:  Vitals:   10/31/22 1547  BP: 125/66  Pulse: 91  Temp: 98.3 F (36.8 C)  TempSrc: Oral  SpO2: 98%  Weight: 157 lb 1.6 oz (71.3 kg)  Height:  (1.575 m)   General:Well-appearing, pleasant, In NAD Cardiac: RRR, no murmurs rubs or gallops.  Audible carotid bruit on right Respiratory: Normal work of breathing on room air, CTAB Abdominal: Soft, nontender, nondistended.  No Murphy sign, no organomegaly.   Assessment & Plan:   Long-term current use of opiate analgesic for osteoarthritis and fibromyalgia On oxycodone 10 mg every 6 as needed as well as Flexeril 10 mg twice daily as needed, also has spinal procedures done in pain clinic.  Will check U tox today.  Dizziness Continues to have multifactorial dizziness.  Description seems orthostatic in nature, and she actually measures her blood pressure at home and notes that it drops when she goes from sitting to standing.  She does drink plenty of water.  She is on several medications that have previously been tried to be weaned, but restarted because of medical necessity.  She had prior workup submitted to look for adrenal  insufficiency, but she has not completed this yet.  Additionally, she does note occasional episodes of previous syncope and chest discomfort.  She says that she becomes dyspneic and has chest discomfort when she exerts herself.  Cardiac exam is unremarkable, but I do hear a carotid bruit. - Continue encouraging good p.o. hydration - Encouraged her to complete workup for adrenal insufficiency.  Sent her home with cortisol swab. - Coronary CTA - Carotid Doppler  Transaminitis Previously noted worsening transaminitis.  She has no abdominal pain or organomegaly on exam.  Murphy sign negative.  Will obtain hepatitis labs and right upper quadrant ultrasound.  Of note, she does have a strong autoimmune history in her family.  I wonder if this is an autoimmune etiology such as autoimmune hepatitis, PBC, PSC.  If initial workup is overall unrevealing, can think of these alternatives and further workup.  Healthcare maintenance Requests audiology referral as she feels like her hearing is getting worse.  Of note, given her dizziness as well if she does actually have hearing loss, can consider vestibular etiology such as Mnire's or other central source.   Patient discussed with Dr. Mikey Bussing

## 2022-11-06 LAB — TOXASSURE SELECT,+ANTIDEPR,UR

## 2022-11-06 NOTE — Progress Notes (Signed)
Internal Medicine Clinic Attending  Case discussed with the resident at the time of the visit.  We reviewed the resident's history and exam and pertinent patient test results.  I agree with the assessment, diagnosis, and plan of care documented in the resident's note.  

## 2022-11-07 ENCOUNTER — Ambulatory Visit (HOSPITAL_COMMUNITY): Admission: RE | Admit: 2022-11-07 | Payer: Medicaid Other | Source: Ambulatory Visit

## 2022-11-07 NOTE — Addendum Note (Signed)
Addended byLyndle Herrlich on: 11/07/2022 09:53 AM   Modules accepted: Orders

## 2022-11-08 ENCOUNTER — Ambulatory Visit (HOSPITAL_COMMUNITY): Admission: RE | Admit: 2022-11-08 | Payer: Medicaid Other | Source: Ambulatory Visit

## 2022-11-08 ENCOUNTER — Other Ambulatory Visit: Payer: Medicaid Other

## 2022-11-08 ENCOUNTER — Other Ambulatory Visit (HOSPITAL_BASED_OUTPATIENT_CLINIC_OR_DEPARTMENT_OTHER): Payer: Medicaid Other

## 2022-11-08 DIAGNOSIS — R42 Dizziness and giddiness: Secondary | ICD-10-CM

## 2022-11-08 DIAGNOSIS — I951 Orthostatic hypotension: Secondary | ICD-10-CM

## 2022-11-13 LAB — CORTISOL, SALIVARY: Salivary Cortisol, MS: 0.112 ug/dL

## 2022-11-23 ENCOUNTER — Other Ambulatory Visit: Payer: Self-pay

## 2022-11-23 DIAGNOSIS — G8929 Other chronic pain: Secondary | ICD-10-CM

## 2022-11-23 MED ORDER — OXYCODONE HCL 10 MG PO TABS
10.0000 mg | ORAL_TABLET | Freq: Four times a day (QID) | ORAL | 0 refills | Status: DC | PRN
Start: 2022-11-23 — End: 2023-01-14

## 2022-11-23 NOTE — Telephone Encounter (Signed)
Last rx written 10/23/22. Last OV 10/31/22. Next OV - has not been scheduled. TOX 10/31/22.

## 2022-11-29 ENCOUNTER — Telehealth: Payer: Self-pay

## 2022-11-29 ENCOUNTER — Ambulatory Visit: Payer: Medicaid Other | Attending: Audiology | Admitting: Audiology

## 2022-11-29 ENCOUNTER — Other Ambulatory Visit (HOSPITAL_COMMUNITY): Payer: Self-pay

## 2022-11-29 DIAGNOSIS — H903 Sensorineural hearing loss, bilateral: Secondary | ICD-10-CM | POA: Diagnosis present

## 2022-11-29 NOTE — Telephone Encounter (Signed)
A Prior Authorization was initiated for this patients OXYCODONE through CoverMyMeds.   Key: [id number]

## 2022-11-29 NOTE — Procedures (Signed)
  Outpatient Audiology and [facility] [address] [place], [place]  [address] [phone]  AUDIOLOGICAL  EVALUATION  NAME: Margaux Keitz     DOB:   Oct 30, 1960      MRN: [id number]                                                                                     DATE: 11/29/2022     REFERENT: Dickie La, MD STATUS: Outpatient DIAGNOSIS: SNHL, bilateral    History: Valada was seen for an audiological evaluation due to decreased hearing occurring for many years. Tatijana reports decreased hearing occurring for 10+ years. She reports increased difficulty hearing the television, communicating with her family, and hearing the telephone. Preeya reports constant, bothersome tinnitus. She reports intermittent aural fullness. Bekki reports episodes of dizziness which she describes as off-balance.   Evaluation:  Otoscopy showed a clear view of the tympanic membranes, bilaterally Tympanometry results were consistent with normal middle ear function (Type A), bilaterally.  Audiometric testing was completed using Conventional Audiometry techniques with insert earphones and TDH headphones. Test results are consistent in the right ear with normal hearing sensitivity [phone] sloping to a severe sensorineural hearing loss and consistent in the left ear with normal hearing sensitivity [phone] sloping to a severe to profound sensorineural hearing loss. Speech Recognition Thresholds were obtained at 20 dB HL in the right ear and at 20  dB HL in the left ear. Word Recognition Testing was completed at 70 dB HL and Chonda scored 100% in the right ear and 96% in the left ear.      Results:  The test results were reviewed with [facility]. Test results are consistent in the right ear with normal hearing sensitivity [phone] sloping to a severe sensorineural hearing loss and consistent in the left ear with normal hearing sensitivity [phone] sloping to a severe to  profound sensorineural hearing loss. Asymmetry noted at 1500-3000 Hz worse in the right ear and at 6000 Hz worse in the left ear. Alease will have communication difficulty in many listening environments. She will benefit from the use of amplification and good communication strategies. Caslyn was given information and counseled regarding tinnitus management.   Recommendations: 1.   Referral to [facility] Audiology Department for a Communication Needs Assessment for Medicaid Hearing aids.  2.   Continue to monitor hearing sensitivity.    30 minutes spent testing and counseling on results.   If you have any questions please feel free to contact me at [phone].  Marton Redwood Audiologist, Au.D., CCC-A 11/29/2022  12:17 PM  Cc: Dickie La, MD

## 2022-11-29 NOTE — Telephone Encounter (Signed)
Prior Auth for patients medication OXYCODONE approved by [employer] from 11/29/22 to 05/27/61.  CoverMyMeds Key: [id number] PA Case ID #: [id number]

## 2022-12-06 ENCOUNTER — Other Ambulatory Visit: Payer: Self-pay | Admitting: *Deleted

## 2022-12-06 ENCOUNTER — Other Ambulatory Visit (HOSPITAL_COMMUNITY): Payer: Self-pay

## 2022-12-06 DIAGNOSIS — M1991 Primary osteoarthritis, unspecified site: Secondary | ICD-10-CM

## 2022-12-06 MED ORDER — DICLOFENAC SODIUM 1 % EX GEL
4.0000 g | Freq: Four times a day (QID) | CUTANEOUS | 1 refills | Status: DC
Start: 2022-12-06 — End: 2023-06-18
  Filled 2022-12-06: qty 300, 30d supply, fill #0

## 2022-12-06 NOTE — Telephone Encounter (Addendum)
Patient called in stating she cannot p/u her oxycodone as it needs a PA. PA was approved on 5/16. Spoke with Byrd Hesselbach at [facility]. States it is going through and patient can p/u in 2 hours.  Notified patient. She is also requesting refill on diclofenac gel. She would like to use [facility] CP going forward.

## 2022-12-19 ENCOUNTER — Other Ambulatory Visit (HOSPITAL_COMMUNITY): Payer: Self-pay

## 2022-12-20 ENCOUNTER — Ambulatory Visit (HOSPITAL_BASED_OUTPATIENT_CLINIC_OR_DEPARTMENT_OTHER)
Admission: RE | Admit: 2022-12-20 | Discharge: 2022-12-20 | Disposition: A | Discharge status: Home / Self Care | Payer: Medicaid Other | Source: Ambulatory Visit | Attending: Internal Medicine | Admitting: Internal Medicine

## 2022-12-20 ENCOUNTER — Ambulatory Visit (HOSPITAL_COMMUNITY)
Admission: RE | Admit: 2022-12-20 | Discharge: 2022-12-20 | Disposition: A | Discharge status: Home / Self Care | Payer: Medicaid Other | Source: Ambulatory Visit | Attending: Internal Medicine | Admitting: Internal Medicine

## 2022-12-20 DIAGNOSIS — I517 Cardiomegaly: Secondary | ICD-10-CM | POA: Insufficient documentation

## 2022-12-20 DIAGNOSIS — E785 Hyperlipidemia, unspecified: Secondary | ICD-10-CM

## 2022-12-20 DIAGNOSIS — R079 Chest pain, unspecified: Secondary | ICD-10-CM | POA: Diagnosis not present

## 2022-12-20 DIAGNOSIS — E119 Type 2 diabetes mellitus without complications: Secondary | ICD-10-CM | POA: Insufficient documentation

## 2022-12-20 DIAGNOSIS — R42 Dizziness and giddiness: Secondary | ICD-10-CM

## 2022-12-20 DIAGNOSIS — I779 Disorder of arteries and arterioles, unspecified: Secondary | ICD-10-CM | POA: Diagnosis present

## 2022-12-20 DIAGNOSIS — R0789 Other chest pain: Secondary | ICD-10-CM | POA: Diagnosis not present

## 2022-12-20 LAB — ECHOCARDIOGRAM COMPLETE
AR max vel: 2.41 cm2
AV Area VTI: 2.45 cm2
AV Area mean vel: 2.19 cm2
AV Mean grad: 3 mmHg
AV Peak grad: 5.2 mmHg
Ao pk vel: 1.14 m/s
Area-P 1/2: 2.94 cm2
MV VTI: 2.97 cm2
S' Lateral: 2.5 cm

## 2022-12-20 NOTE — Progress Notes (Signed)
*  PRELIMINARY RESULTS* Echocardiogram 2D Echocardiogram has been performed.  [name] 12/20/2022, 2:51 PM

## 2023-01-01 ENCOUNTER — Ambulatory Visit (HOSPITAL_COMMUNITY): Admission: RE | Admit: 2023-01-01 | Payer: Medicaid Other | Source: Ambulatory Visit

## 2023-01-14 ENCOUNTER — Other Ambulatory Visit: Payer: Self-pay

## 2023-01-14 ENCOUNTER — Telehealth: Payer: Self-pay | Admitting: *Deleted

## 2023-01-14 ENCOUNTER — Other Ambulatory Visit (HOSPITAL_COMMUNITY): Payer: Self-pay

## 2023-01-14 DIAGNOSIS — G8929 Other chronic pain: Secondary | ICD-10-CM

## 2023-01-14 MED ORDER — OXYCODONE HCL 10 MG PO TABS
10.0000 mg | ORAL_TABLET | Freq: Four times a day (QID) | ORAL | 0 refills | Status: DC | PRN
Start: 2023-01-14 — End: 2023-02-18
  Filled 2023-01-14: qty 120, 30d supply, fill #0

## 2023-01-14 NOTE — Telephone Encounter (Signed)
According to the PDMP her last fill was 12/06/2022 and she should be due for refill now. Sent in Rx.

## 2023-01-14 NOTE — Telephone Encounter (Signed)
Already refilled

## 2023-01-14 NOTE — Telephone Encounter (Signed)
Oxycodone was refilled today. Triage cannot refuse refill requests on narcotics/schedule II meds. Can you refuse this refill? Thanks

## 2023-01-14 NOTE — Telephone Encounter (Signed)
Patient called in requesting early refill on oxycodone. States she is due on 7/5 but CP at [facility] will be closed 7/4 and 7/5.

## 2023-01-21 ENCOUNTER — Other Ambulatory Visit (HOSPITAL_COMMUNITY): Payer: Self-pay

## 2023-01-21 NOTE — Telephone Encounter (Signed)
Pt called and stated she's waiting for the refill on her pain med. Informed pt Oxycodone rx was sent to [facility] on 01/14/23. Stated she did not know if as sent there; stated she had been calling a different pharmacy. Also stated she has been out of med x 8 days.

## 2023-01-30 ENCOUNTER — Other Ambulatory Visit: Payer: Self-pay | Admitting: *Deleted

## 2023-01-30 ENCOUNTER — Other Ambulatory Visit (HOSPITAL_COMMUNITY): Payer: Self-pay

## 2023-01-30 DIAGNOSIS — M1991 Primary osteoarthritis, unspecified site: Secondary | ICD-10-CM

## 2023-01-30 MED ORDER — CELECOXIB 100 MG PO CAPS
100.0000 mg | ORAL_CAPSULE | Freq: Every day | ORAL | 11 refills | Status: DC | PRN
Start: 2023-01-30 — End: 2023-02-12
  Filled 2023-01-30: qty 30, 30d supply, fill #0

## 2023-01-30 NOTE — Telephone Encounter (Signed)
Will follow-up request for glylcopyrrolate at upcoming visit. This was discontinued at a prior visit due to ineffectiveness and concern for contribution to orthostasis.

## 2023-01-30 NOTE — Telephone Encounter (Signed)
Call from pt requesting a refill on Celebrex and Glycopyrrolate, this med is not on her current med list. And she is scheduling an appt to discuss pain med not controlling her pain. Thanks

## 2023-01-31 NOTE — Telephone Encounter (Signed)
Pt called/informed of Celebrex refill and Dr Graciella Freer response about Glycopyrrolate.

## 2023-02-11 ENCOUNTER — Other Ambulatory Visit (HOSPITAL_COMMUNITY): Payer: Self-pay

## 2023-02-12 ENCOUNTER — Encounter: Payer: Self-pay | Admitting: Internal Medicine

## 2023-02-12 ENCOUNTER — Ambulatory Visit (INDEPENDENT_AMBULATORY_CARE_PROVIDER_SITE_OTHER): Payer: Medicaid Other | Admitting: Internal Medicine

## 2023-02-12 DIAGNOSIS — M5442 Lumbago with sciatica, left side: Secondary | ICD-10-CM

## 2023-02-12 DIAGNOSIS — R42 Dizziness and giddiness: Secondary | ICD-10-CM

## 2023-02-12 DIAGNOSIS — M797 Fibromyalgia: Secondary | ICD-10-CM

## 2023-02-12 DIAGNOSIS — G8929 Other chronic pain: Secondary | ICD-10-CM

## 2023-02-12 DIAGNOSIS — Z1231 Encounter for screening mammogram for malignant neoplasm of breast: Secondary | ICD-10-CM

## 2023-02-12 DIAGNOSIS — H903 Sensorineural hearing loss, bilateral: Secondary | ICD-10-CM

## 2023-02-12 DIAGNOSIS — M5441 Lumbago with sciatica, right side: Secondary | ICD-10-CM

## 2023-02-12 DIAGNOSIS — R200 Anesthesia of skin: Secondary | ICD-10-CM | POA: Insufficient documentation

## 2023-02-12 DIAGNOSIS — N3946 Mixed incontinence: Secondary | ICD-10-CM

## 2023-02-12 DIAGNOSIS — G894 Chronic pain syndrome: Secondary | ICD-10-CM

## 2023-02-12 DIAGNOSIS — R61 Generalized hyperhidrosis: Secondary | ICD-10-CM | POA: Diagnosis not present

## 2023-02-12 DIAGNOSIS — Z79891 Long term (current) use of opiate analgesic: Secondary | ICD-10-CM

## 2023-02-12 DIAGNOSIS — M25552 Pain in left hip: Secondary | ICD-10-CM

## 2023-02-12 DIAGNOSIS — H9193 Unspecified hearing loss, bilateral: Secondary | ICD-10-CM | POA: Insufficient documentation

## 2023-02-12 MED ORDER — GLYCOPYRROLATE 1 MG PO TABS
1.0000 mg | ORAL_TABLET | Freq: Every day | ORAL | 1 refills | Status: DC
Start: 2023-02-12 — End: 2023-03-12

## 2023-02-12 MED ORDER — GABAPENTIN 100 MG PO CAPS
100.0000 mg | ORAL_CAPSULE | Freq: Three times a day (TID) | ORAL | 1 refills | Status: DC
Start: 2023-02-12 — End: 2023-03-01

## 2023-02-12 NOTE — Assessment & Plan Note (Addendum)
Previously on glycopyrrolate 1 mg bid for symptoms of generalized hyperhidrosis without evidence of secondary cause. Testing for HIV, TSH, hepatitis, ESR/CRP, ANA negative. UTD on Pap and mammogram, Cologuard negative 2023. Ms. Derico says she went through menopause years ago and had hot flashes at that time. Noting these sweating episodes feel totally different. Robinul was discontinued at a prior visit due to waning efficacy and concern for orthostasis/dizziness, however Ms. Schoon reports this medication worked very well and she did not notice any worsening or change in dizzy symptoms while taking.  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 Persisted after discontinuation of GLP-1. CTAP and gastric emptying study ordered at prior visits and not completed. We discussed resuming Robinul for symptomatic treatment and will continue to evaluate for further causes/evaluation at upcoming in-person f/u visit.  Plan -Robinul 1 mg at bedtime -Return to clinic in 4-6 weeks for in-person evaluation

## 2023-02-12 NOTE — Progress Notes (Signed)
[facility] Internal Medicine Residency Telephone Encounter Continuity Care Appointment  HPI:  This telephone encounter was created for [name] on 02/12/2023 for the following purpose/cc: discuss pain and medications. Please see problem-based charting for full details of today's visit. Will need close in-person f/u for the concerns of today's appointment, as well as to discuss uncontrolled DM and transaminitis.     Patient Active Problem List   Diagnosis Date Noted Date Diagnosed   Chronic pain 02/12/2023    Numbness of arm 02/12/2023    Bilateral hearing loss 02/12/2023    Transaminitis 09/08/2022    Generalized hyperhidrosis 06/12/2021     05/2021: responded well to initiation of robinul    Osteoarthritis 04/11/2021    Weight loss 12/25/2020    Primary osteoarthritis of left knee 06/05/2020     S/p left TKA 05/2020.    Obesity (BMI 30-39.9) 04/02/2019    Mixed incontinence urge and stress 01/01/2019     12/2020: oxybutynin discontinued due to orthostatic symptoms. myrbetric not covered by insurance. Referred to urology.    Left hip pain 12/05/2017     Follows with Dr. Roda Shutters with [facility] Failed steroid injections, PT, oral anti-inflammatories 05/2021: seen by ortho>>MRI L spine and left hip ordered    Chronic midline low back pain with bilateral sciatica 01/31/2017    Restless legs syndrome (RLS) 10/24/2016    Long-term current use of opiate analgesic for osteoarthritis and fibromyalgia 03/01/2016     12/23/20: changed from oxy 10mg  to hydrocodone-acetaminophen 10mg  at her request 01/12/21: norco insufficent--will transition back to oxycodone for future prescriptions 07/2021: medication contract signed    Dizziness 06/02/2012    Fibromyalgia 05/23/2012    Healthcare maintenance 03/01/2011    Hyperlipidemia 07/27/2010          DM2 (diabetes mellitus, type 2) (HCC) 07/26/2010     Avoid GLP1'S--developed excessively poor appetite, orthostasis, weakness.  Symptoms occurred on 2 separate trials several months apart.  Avoid metformin--diarrhea, abdominal pain. Symptoms persisted despite >50mo trial.  Have attempted CGM placement multiple times in the past however it help falling off due to work activity.  12/25/20: A1C 7.5. discontinued victoza due to poor appetite, weakness, 24# wt loss, continue jardiance, lantus 01/12/21: self titrated lantus up to 25U 03/2021: A1C 7.9. f/u in November with me with glucose log book--suspect she will need short acting insulin. Wt up 4# 05/2021: Restarted on victoza  07/2021: A1C 8.1. 5# wt loss, weakness. D/c victoza. jardiance increased. 11/23: A1c 8.8 lantus 20 units, jardiance 25, restart victoza 2/24: A1c 9 lantus 16 units, jardiance 25, victoza 0.6     Assessment / Plan / Recommendations:  Please see A&P under problem oriented charting for assessment of the patient's acute and chronic medical conditions.  As always, pt is advised that if symptoms worsen or new symptoms arise, they should go to an urgent care facility or to to ER for further evaluation.   Chronic pain syndrome Assessment & Plan: [name] has a longstanding history of chronic pain related to several conditions. 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 Amescua has undergone multiple evaluations and surgical interventions for many of the above conditions. Today, she notes her pain continues to be severe and she feels her pain medication is not lasting as long as previously. Main sources of pain noted today include her lower back, left hip, and bilateral feet (L>R). Her left knee also hurts but is tolerable. She also feels her b/l Dupuytren's contractures and plantar fibromatosis are coming back following prior release/surgery. She remains on oxycodone 10 mg q6h prn, noting that this will typically last 5 hours if not active and maybe 4 if active. She has been supplementing with Advil, noting she may take up to 1200 mg at once, 3-4x/day. Also using Voltaren gel and OTC pain relief creams.   On chart review, she has previously trialed Lyrica, Cymbalta, venlafaxine, gabapentin, tramadol, Vicodin, and Percocet for pain control. She has been followed by Dr. Izetta Dakin, spinal pain management specialist, at [facility] and has previously undergone ILESI in 2020 with 75% pain relief for 3 months per Dr. Karle Barr notes, as well as several rounds of radiofrequency ablation. RFA relief lasted ~6 months. She was last seen 2-3 years ago per her report, last visit I can see is 2021. Ms. Barrile was planning to see Dr. Joelene Millin again earlier this year but has had difficulty with transportation to their clinic. She saw a different pain clinic in the past but was very unhappy with their care, mostly due to "tension shots" she was given in her back as well as increased oxycodone dose to 15-20 mg causing drowsiness.   She continues taking Flexeril 10 mg bid for history of fibromyalgia. Tylenol makes her nauseous. We discussed the appropriate usage of ibuprofen and adverse effects of overuse. She denies current use of Celebrex. She reports improvement in some pain with Tramadol in the past. We discussed concerns with increased use of opioids and need to try a  multimodal approach. I suspect she has developed some degree of opioid tolerance, this was also addressed at prior visits with Dr. Joelene Millin, as well as opioid-induced hyperalgesia. She has not been open to weaning in the past. She declined a trial of Lyrica or Cymbalta. She was open to trying gabapentin again today. We discussed starting low and monitoring for adverse effects. Given her complexity, we did discuss referral to an alternative pain management specialist today. She was adamantly opposed to a "pain clinic" in the community but was open to referral to PM&R to discuss additional therapy options. She also would like to reach back out to Dr. Joelene Millin to see if further RFA is an option.   Plan -Continue oxycodone 10 mg q6h prn, return in 4-6 weeks to for further discussion of management options -Start gabapentin 100 mg tid, advised on possible side effects -Maximum ibuprofen 3200 mg/day, ideally much less than this. Will f/u at next appointment. -Continue topical diclofenac as needed -Referral to PM&R +/- RFA with Dr. Joelene Millin if she is able to manage transportation -F/u in 4-6 weeks    Chronic midline low back pain with bilateral sciatica Assessment & Plan: See "Chronic pain" from 02/12/23.  Orders: -     Gabapentin; Take 1 capsule (100 mg total) by mouth 3 (three) times daily.  Dispense: 90 capsule; Refill: 1 -  Ambulatory referral to Physical Medicine Rehab  Fibromyalgia Assessment & Plan: See "Chronic pain" from 02/12/23.  Orders: -     Gabapentin; Take 1 capsule (100 mg total) by mouth 3 (three) times daily.  Dispense: 90 capsule; Refill: 1  Generalized hyperhidrosis Assessment & Plan: Previously on glycopyrrolate 1 mg bid for symptoms of generalized hyperhidrosis without evidence of secondary cause. Testing for HIV, TSH, hepatitis, ESR/CRP, ANA negative. UTD on Pap and mammogram, Cologuard negative 2023. Ms. Pellman says she went through menopause years ago and had hot flashes at  that time. Noting these sweating episodes feel totally different. Robinul was discontinued at a prior visit due to waning efficacy and concern for orthostasis/dizziness, however Ms. Eyster reports this medication worked very well and she did not notice any worsening or change in dizzy symptoms while taking.  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 Persisted after discontinuation of GLP-1. CTAP and gastric emptying study ordered at prior visits and not completed. We discussed resuming Robinul for symptomatic treatment and will continue to evaluate for further causes/evaluation at upcoming in-person f/u visit.  Plan -Robinul 1 mg at bedtime -Return to clinic in 4-6 weeks for in-person evaluation  Orders: -     Glycopyrrolate; Take 1 tablet (1 mg total) by mouth at bedtime.  Dispense: 30 tablet; Refill: 1  Bilateral hearing loss, unspecified hearing loss type -     Ambulatory referral to Audiology  Mixed incontinence urge and stress Assessment & Plan: Remains on oxybutynin for mixed incontinence. Previous concerns regarding orthostasis/dizziness with this medication but alternatives noted to not be covered by insurance. We will readdress this at f/u visit, likely will need PA. Referred to urology previously, I do not think she went to these appointments.   Left hip pain Assessment & Plan: See "Chronic pain" from 02/12/23.   Encounter for screening mammogram for malignant neoplasm of breast -     3D Screening Mammogram, Left and Right; Future  Long-term current use of opiate analgesic for osteoarthritis and fibromyalgia Assessment & Plan: See "Chronic pain" from 02/12/23. Tox[employer]ure 10/31/22. Will further discuss opioid medications at in-person f/u appointment.   Dizziness Assessment & Plan: Notes improvement in dizziness symptoms. Occasionally will notice dizziness upon standing that resolves quickly. We will need to complete  further medication review at f/u and repeat orthostatic vital signs.   Numbness of arm Assessment & Plan: Describing intermittent, transient, bilateral arm numbness, L > R, that starts in the hands and goes up the arm, stopping above the elbow. Describes a tingling sensation. This happens about 12x/day, lasting for 2-3 minutes. No clear triggers. History of bilateral CTS s/p release although distribution is unusual. Transient nature also makes something like DM neuropathy seem less likely. We discussed the need for in-person visit for physical exam and possible NCS. Gabapentin may help the discomfort given neuropathic-like symptoms.  Plan -F/u in-person -Gabapentin 100 mg tid      Consent and Medical Decision Making:  This is a telephone encounter between [facility] and Dickie La on 02/12/2023 for medication review and discussion of pain management. The visit was conducted with the patient located at home and Dickie La at [facility]. The patient's identity was confirmed using their DOB and current address. The patient has consented to being evaluated through a telephone encounter and understands the associated risks (an examination cannot be done and the patient may need to come in for an appointment) / benefits (allows the patient  to remain at home, decreasing exposure to coronavirus). I personally spent 25 minutes on medical discussion.

## 2023-02-12 NOTE — Assessment & Plan Note (Signed)
See "Chronic pain" from 02/12/23.

## 2023-02-12 NOTE — Patient Instructions (Addendum)
Thank you for making an appointment with [name] today.  Today we discussed continuing oxycodone 10 mg q6h prn as outlined in your controlled medication agreement. Please do not take more than 3200 mg of ibuprofen daily, less would be better to protect your stomach and kidneys. We will need to check your kidney function at your follow-up appointment. Continue using your Voltaren gel and OTC creams.   We have discussed adding gabapentin 100 mg three times daily to your pain regimen. Please use caution with driving until you know how this medication may affect you. I have sent a referral to Physical Medicine & Rehabilitation to see if there are other options available for pain management. You are going to call Dr. Joelene Millin to see if you can continue radiofrequency. If you change your mind and are interested in seeing a pain clinic for further discussion, please let me know.  For sweating, we have discussed resuming glycopyrrolate 1 mg tablet at bedtime. Please watch for dizziness symptoms while taking this medication.  We will make an appointment in clinic within the next 4-6 weeks to follow-up on pain, hand symptoms, and medication review.

## 2023-02-12 NOTE — Assessment & Plan Note (Addendum)
 >>ASSESSMENT AND PLAN FOR CHRONIC PAIN WRITTEN ON 02/12/2023  4:26 PM BY Marisa Pippenger, MD  [name] has a longstanding history of chronic pain related to several conditions. 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 Today, she notes her pain continues to be severe and she feels her pain medication is not lasting as long as previously. Main sources of pain noted today include her lower back, left hip, and bilateral feet (L>R). Her left knee also hurts but is tolerable. She also feels her b/l Dupuytren's contractures and plantar fibromatosis are coming back following prior release/surgery. She remains on oxycodone  10 mg q6h prn, noting that this will typically last 5 hours if not active and maybe 4 if active. She has been supplementing with Advil , noting she may take up to 1200 mg at once, 3-4x/day. Also using Voltaren  gel and OTC pain relief creams.   On chart review, she has previously trialed Lyrica , Cymbalta , venlafaxine , gabapentin , tramadol , Vicodin, and Percocet for pain control. She has been followed by Dr. Delon Mae, spinal pain management specialist, at [facility] and has previously  undergone ILESI in 2020 with 75% pain relief for 3 months per Dr. Kay notes, as well as several rounds of radiofrequency ablation. RFA relief lasted ~6 months. She was last seen 2-3 years ago per her report, last visit I can see is 2021. [name] was planning to see Dr. Mae again earlier this year but has had difficulty with transportation to their clinic. She saw a different pain clinic in the past but was very unhappy with their care, mostly due to tension shots she was given in her back as well as increased oxycodone  dose to 15-20 mg causing drowsiness.   She continues taking Flexeril  10 mg bid for history of fibromyalgia. Tylenol  makes her nauseous. We discussed the appropriate usage of ibuprofen  and adverse effects of overuse. She denies current use of Celebrex . She reports improvement in some pain with Tramadol  in the past. We discussed concerns with increased use of opioids and need to try a multimodal approach. I suspect she has developed some degree of opioid tolerance, this was also addressed at prior visits with Dr. Mae, as well as opioid-induced hyperalgesia. She has not been open to weaning in the past. She declined a trial of Lyrica  or Cymbalta . She was open to trying gabapentin  again today. We discussed starting low and monitoring for adverse effects. Given her complexity, we did discuss referral to an alternative pain management specialist today. She was adamantly opposed to a pain clinic in the community but was open to referral to PM&R to discuss additional therapy options. She also would like to reach back out to Dr. Mae to see if further RFA is an option.   Plan -Continue oxycodone  10 mg q6h prn, return in 4-6  weeks to for further discussion of management options -Start gabapentin  100 mg tid, advised on possible side effects -Maximum ibuprofen  3200 mg/day, ideally much less than this. Will f/u at next appointment. -Continue topical diclofenac  as needed -Referral to  PM&R +/- RFA with Dr. Chesley if she is able to manage transportation -F/u in 4-6 weeks    >>ASSESSMENT AND PLAN FOR CHRONIC MIDLINE LOW BACK PAIN WITH BILATERAL SCIATICA WRITTEN ON 02/12/2023  4:21 PM BY Elek Holderness, MD  See Chronic pain from 02/12/23.

## 2023-02-12 NOTE — Assessment & Plan Note (Signed)
Describing intermittent, transient, bilateral arm numbness, L > R, that starts in the hands and goes up the arm, stopping above the elbow. Describes a tingling sensation. This happens about 12x/day, lasting for 2-3 minutes. No clear triggers. History of bilateral CTS s/p release although distribution is unusual. Transient nature also makes something like DM neuropathy seem less likely. We discussed the need for in-person visit for physical exam and possible NCS. Gabapentin may help the discomfort given neuropathic-like symptoms.  Plan -F/u in-person -Gabapentin 100 mg tid

## 2023-02-12 NOTE — Assessment & Plan Note (Signed)
Remains on oxybutynin for mixed incontinence. Previous concerns regarding orthostasis/dizziness with this medication but alternatives noted to not be covered by insurance. We will readdress this at f/u visit, likely will need PA. Referred to urology previously, I do not think she went to these appointments.

## 2023-02-12 NOTE — Assessment & Plan Note (Signed)
See "Chronic pain" from 02/12/23. ToxAssure 10/31/22. Will further discuss opioid medications at in-person f/u appointment.

## 2023-02-12 NOTE — Assessment & Plan Note (Signed)
Previously evaluated by audiology 11/2022 with recommendations for referral to [facility] for Medicaid hearing aid evaluation. I do not see an active referral and [name] has not been contacted. Referral placed today.

## 2023-02-12 NOTE — Assessment & Plan Note (Addendum)
Notes improvement in dizziness symptoms. Occasionally will notice dizziness upon standing that resolves quickly. We will need to complete further medication review at f/u and repeat orthostatic vital signs.

## 2023-02-12 NOTE — Assessment & Plan Note (Addendum)
See "Chronic pain" from 02/12/23.

## 2023-02-15 ENCOUNTER — Encounter: Payer: Self-pay | Admitting: Physical Medicine & Rehabilitation

## 2023-02-18 ENCOUNTER — Other Ambulatory Visit: Payer: Self-pay | Admitting: Internal Medicine

## 2023-02-18 DIAGNOSIS — G8929 Other chronic pain: Secondary | ICD-10-CM

## 2023-02-18 MED ORDER — OXYCODONE HCL 10 MG PO TABS
10.0000 mg | ORAL_TABLET | Freq: Four times a day (QID) | ORAL | 0 refills | Status: DC | PRN
Start: 2023-02-18 — End: 2023-03-13

## 2023-02-18 NOTE — Telephone Encounter (Signed)
Oxycodone HCl 10 MG TABS   [facility] Address: [address] [place], [place] [address] Phone: [phone]

## 2023-02-18 NOTE — Addendum Note (Signed)
Addended by: Hassan Buckler on: 02/18/2023 03:09 PM   Modules accepted: Orders

## 2023-02-18 NOTE — Telephone Encounter (Signed)
Last rx written - 01/14/23. Last OV - 02/12/23. Next OV - 8/27 with Dr Sol Blazing. TOX - 10/31/22.

## 2023-02-18 NOTE — Telephone Encounter (Signed)
PDMP reviewed and appropriate, however I was unable select the "Review" or "Unable to review" button on PDMP website.

## 2023-02-20 ENCOUNTER — Telehealth: Payer: Self-pay | Admitting: *Deleted

## 2023-02-20 NOTE — Telephone Encounter (Signed)
Spoke with patient regarding her mammogram scheduled for August 27.2024 @4 :10 pm/ will be sending [employer] information for patient/ states she no longer medicaid. Patient was advised to call and cancel or there will be a 75.00 no show fee charge. Sending appointment information in the mail.

## 2023-02-28 ENCOUNTER — Telehealth: Payer: Self-pay | Admitting: *Deleted

## 2023-02-28 DIAGNOSIS — G8929 Other chronic pain: Secondary | ICD-10-CM

## 2023-02-28 DIAGNOSIS — M797 Fibromyalgia: Secondary | ICD-10-CM

## 2023-02-28 NOTE — Telephone Encounter (Signed)
Pt stated she needs a refill on Celecoxib. Not on current med list. Pt stated she has not stopped taking this med and she really needs it. Thanks

## 2023-03-01 ENCOUNTER — Other Ambulatory Visit (HOSPITAL_COMMUNITY): Payer: Self-pay

## 2023-03-01 MED ORDER — CELECOXIB 100 MG PO CAPS
100.0000 mg | ORAL_CAPSULE | Freq: Every day | ORAL | 3 refills | Status: DC | PRN
  Filled 2023-03-01: qty 30, 30d supply, fill #0

## 2023-03-01 MED ORDER — GABAPENTIN 100 MG PO CAPS
200.0000 mg | ORAL_CAPSULE | Freq: Three times a day (TID) | ORAL | Status: DC
Start: 2023-03-01 — End: 2023-03-12

## 2023-03-01 NOTE — Telephone Encounter (Addendum)
Spoke with patient. At last visit, patient was taking large amounts of ibuprofen and noted she was not taking Celebrex. Today, [name] notes she has continued Celebrex but stopped ibuprofen since our last visit and initiation of gabapentin. We discussed okay to continue Celebrex if not taking alternative NSAIDs, including ibuprofen, to which she agrees. She has noticed some benefit from gabapentin, we discussed increase to 200 mg tid. She will continue to monitor for side effects and avoid driving until she knows how this will effect her. Reminded of in-person appointment 8/27.

## 2023-03-08 ENCOUNTER — Other Ambulatory Visit: Payer: Self-pay | Admitting: Internal Medicine

## 2023-03-08 DIAGNOSIS — R61 Generalized hyperhidrosis: Secondary | ICD-10-CM

## 2023-03-09 ENCOUNTER — Other Ambulatory Visit: Payer: Self-pay | Admitting: Internal Medicine

## 2023-03-09 DIAGNOSIS — N3946 Mixed incontinence: Secondary | ICD-10-CM

## 2023-03-11 NOTE — Telephone Encounter (Addendum)
Pharmacy comment: REQUEST FOR 90 DAYS PRESCRIPTION.   Next appt scheduled tomorrow w/Dr Sol Blazing.

## 2023-03-12 ENCOUNTER — Other Ambulatory Visit: Payer: Self-pay

## 2023-03-12 ENCOUNTER — Encounter: Payer: Self-pay | Admitting: Internal Medicine

## 2023-03-12 ENCOUNTER — Ambulatory Visit: Payer: Medicaid Other

## 2023-03-12 ENCOUNTER — Other Ambulatory Visit (HOSPITAL_COMMUNITY): Payer: Self-pay

## 2023-03-12 ENCOUNTER — Ambulatory Visit: Payer: Self-pay | Admitting: Internal Medicine

## 2023-03-12 VITALS — BP 163/71 | HR 64 | Temp 98.2°F | Ht 62.0 in | Wt 166.4 lb

## 2023-03-12 DIAGNOSIS — M797 Fibromyalgia: Secondary | ICD-10-CM

## 2023-03-12 DIAGNOSIS — G894 Chronic pain syndrome: Secondary | ICD-10-CM

## 2023-03-12 DIAGNOSIS — E119 Type 2 diabetes mellitus without complications: Secondary | ICD-10-CM

## 2023-03-12 DIAGNOSIS — D751 Secondary polycythemia: Secondary | ICD-10-CM

## 2023-03-12 DIAGNOSIS — R7401 Elevation of levels of liver transaminase levels: Secondary | ICD-10-CM

## 2023-03-12 DIAGNOSIS — Z794 Long term (current) use of insulin: Secondary | ICD-10-CM

## 2023-03-12 DIAGNOSIS — N3946 Mixed incontinence: Secondary | ICD-10-CM

## 2023-03-12 DIAGNOSIS — Z79891 Long term (current) use of opiate analgesic: Secondary | ICD-10-CM

## 2023-03-12 DIAGNOSIS — E113393 Type 2 diabetes mellitus with moderate nonproliferative diabetic retinopathy without macular edema, bilateral: Secondary | ICD-10-CM

## 2023-03-12 DIAGNOSIS — R634 Abnormal weight loss: Secondary | ICD-10-CM

## 2023-03-12 DIAGNOSIS — R202 Paresthesia of skin: Secondary | ICD-10-CM

## 2023-03-12 DIAGNOSIS — R200 Anesthesia of skin: Secondary | ICD-10-CM

## 2023-03-12 DIAGNOSIS — Z7984 Long term (current) use of oral hypoglycemic drugs: Secondary | ICD-10-CM

## 2023-03-12 DIAGNOSIS — R61 Generalized hyperhidrosis: Secondary | ICD-10-CM

## 2023-03-12 LAB — POCT GLYCOSYLATED HEMOGLOBIN (HGB A1C): Hemoglobin A1C: 8.3 % — AB (ref 4.0–5.6)

## 2023-03-12 LAB — GLUCOSE, CAPILLARY: Glucose-Capillary: 178 mg/dL — ABNORMAL HIGH (ref 70–99)

## 2023-03-12 MED ORDER — CYCLOBENZAPRINE HCL 10 MG PO TABS
10.0000 mg | ORAL_TABLET | Freq: Two times a day (BID) | ORAL | 3 refills | Status: DC | PRN
  Filled 2023-03-12: qty 60, 30d supply, fill #0

## 2023-03-12 MED ORDER — LIRAGLUTIDE 18 MG/3ML ~~LOC~~ SOPN
0.6000 mg | PEN_INJECTOR | Freq: Every day | SUBCUTANEOUS | 3 refills | Status: DC
  Filled 2023-03-12: qty 9, 90d supply, fill #0

## 2023-03-12 MED ORDER — OXYBUTYNIN CHLORIDE ER 10 MG PO TB24
10.0000 mg | ORAL_TABLET | Freq: Every day | ORAL | 0 refills | Status: DC
  Filled 2023-03-12: qty 90, 90d supply, fill #0

## 2023-03-12 MED ORDER — LANTUS SOLOSTAR 100 UNIT/ML ~~LOC~~ SOPN
10.0000 [IU] | PEN_INJECTOR | Freq: Every day | SUBCUTANEOUS | 1 refills | Status: DC
  Filled 2023-03-12: qty 15, 150d supply, fill #0

## 2023-03-12 MED ORDER — PEN NEEDLES 32G X 4 MM MISC
1.0000 | Freq: Every day | 0 refills | Status: DC
Start: 2023-03-12 — End: 2023-10-01
  Filled 2023-03-12: qty 100, 50d supply, fill #0
  Filled 2023-07-22: qty 100, 50d supply, fill #1
  Filled 2023-09-19: qty 100, 30d supply, fill #2

## 2023-03-12 MED ORDER — TRUEPLUS LANCETS 28G MISC
1 refills | Status: DC
Start: 2023-03-12 — End: 2023-10-01
  Filled 2023-03-12: qty 100, 25d supply, fill #0
  Filled 2023-07-22: qty 100, 25d supply, fill #1

## 2023-03-12 MED ORDER — TRUE METRIX METER W/DEVICE KIT
1.0000 | PACK | Freq: Three times a day (TID) | 1 refills | Status: DC
Start: 2023-03-12 — End: 2023-10-01
  Filled 2023-03-12: qty 1, 1d supply, fill #0

## 2023-03-12 MED ORDER — GABAPENTIN 300 MG PO CAPS
300.0000 mg | ORAL_CAPSULE | Freq: Every day | ORAL | 11 refills | Status: DC
Start: 2023-03-12 — End: 2023-06-18
  Filled 2023-03-12: qty 30, 30d supply, fill #0
  Filled 2023-05-07 – 2023-05-23 (×3): qty 30, 30d supply, fill #1

## 2023-03-12 MED ORDER — LIRAGLUTIDE 18 MG/3ML ~~LOC~~ SOPN
0.6000 mg | PEN_INJECTOR | Freq: Every day | SUBCUTANEOUS | 3 refills | Status: DC
Start: 2023-03-12 — End: 2023-06-19
  Filled 2023-03-12: qty 6, 60d supply, fill #0
  Filled 2023-05-07: qty 6, 60d supply, fill #1
  Filled 2023-05-08: qty 9, 90d supply, fill #0
  Filled 2023-05-23: qty 6, 60d supply, fill #0
  Filled 2023-05-23: qty 3, 30d supply, fill #0
  Filled 2023-05-23: qty 6, 60d supply, fill #0

## 2023-03-12 MED ORDER — ACCU-CHEK GUIDE VI STRP
ORAL_STRIP | 9 refills | Status: DC
  Filled 2023-03-12: qty 100, 33d supply, fill #0

## 2023-03-12 MED ORDER — LANTUS SOLOSTAR 100 UNIT/ML ~~LOC~~ SOPN
10.0000 [IU] | PEN_INJECTOR | Freq: Every day | SUBCUTANEOUS | 1 refills | Status: DC
Start: 2023-03-12 — End: 2023-05-08
  Filled 2023-03-12: qty 3, 30d supply, fill #0
  Filled 2023-05-07: qty 3, 30d supply, fill #1

## 2023-03-12 MED ORDER — ACCU-CHEK GUIDE W/DEVICE KIT
1.0000 | PACK | Freq: Three times a day (TID) | 1 refills | Status: DC
  Filled 2023-03-12: qty 1, 30d supply, fill #0

## 2023-03-12 MED ORDER — CYCLOBENZAPRINE HCL 10 MG PO TABS
10.0000 mg | ORAL_TABLET | Freq: Two times a day (BID) | ORAL | 3 refills | Status: DC | PRN
Start: 2023-03-12 — End: 2023-06-18
  Filled 2023-05-07 – 2023-05-23 (×4): qty 60, 30d supply, fill #0

## 2023-03-12 MED ORDER — GABAPENTIN 300 MG PO CAPS
300.0000 mg | ORAL_CAPSULE | Freq: Every day | ORAL | Status: DC
Start: 2023-03-12 — End: 2023-03-12

## 2023-03-12 MED ORDER — NALOXONE HCL 0.4 MG/ML IJ SOLN
0.4000 mg | INTRAMUSCULAR | 0 refills | Status: AC | PRN
Start: 2023-03-12 — End: ?
  Filled 2023-03-12: qty 1, fill #0

## 2023-03-12 MED ORDER — ACCU-CHEK GUIDE VI STRP
ORAL_STRIP | Freq: Three times a day (TID) | 9 refills | Status: DC
Start: 2023-03-12 — End: 2024-02-18
  Filled 2023-03-12: qty 100, 30d supply, fill #0

## 2023-03-12 MED ORDER — OXYBUTYNIN CHLORIDE ER 10 MG PO TB24
10.0000 mg | ORAL_TABLET | Freq: Every day | ORAL | 0 refills | Status: DC
Start: 2023-03-12 — End: 2024-05-01
  Filled 2023-09-12: qty 90, 90d supply, fill #0

## 2023-03-12 NOTE — Assessment & Plan Note (Addendum)
Hemoglobin elevated to 16.0 in May 2023 and February 2024. No clear etiology of secondary erythrocytosis noted from patient history. We discussed need to evaluate further with lab evaluation today, however patient declined due to financial burden and lack of insurance coverage. We discussed applying for [facility] and Medicaid, will hopefully be able to complete at f/u visit in one month.  Plan -F/u in one month -CBC w/ diff, EPO, smear

## 2023-03-12 NOTE — Assessment & Plan Note (Signed)
Updated controlled substance agreement today and reviewed expectations of every 3 month appointments. ToxAssure completed 10/2022. Continue current opioid medication for now as it is helping with function and we are working on multimodal options for additional pain management. Referred to PM&R at last visit, pending. Naloxone prescribed today for risk of overdose and respiratory depression, instructed patient on reason for use.

## 2023-03-12 NOTE — Progress Notes (Signed)
Copy of Pain Contract given to pt. 

## 2023-03-12 NOTE — Assessment & Plan Note (Deleted)
-   Continue flexeril 10 mg  - Refill sent

## 2023-03-12 NOTE — Progress Notes (Addendum)
Subjective:   Patient ID: [name] female   DOB: Jun 26, 1961 62 y.o.   MRN: [id number]  HPI: [name] is a 62 y.o. female with past medical history as below presenting to clinic for a f/u of DM, chronic pain, and 53-month history of left hand tingling. [name] reports she lost her Medicaid recently and is reapplying to get it back which may take more than 300 days. She requested a letter stating that imaging and labs previously ordered had to be cancelled due to no insurance. Dr. Sol Blazing provided the letter to her so it can be given to [facility] case worker on Sept 11th. She usually takes care of her husband who is paraplegic. Provided with [employer] Card/CAFA paperwork at today's visit with close f/u scheduled in one month. Please see assessment and plan for details of the visit.   Past Medical History:  Diagnosis Date   Adhesive capsulitis of right shoulder    with underlying tendinopathy rotator cuff   Arthritis    Diabetes mellitus    oral tx   Fibromyalgia    GERD (gastroesophageal reflux disease)    History of post-sterilization tuboplasty 2000   Pain of left thumb 01/10/2022   Acute pain in the left thumb after fall on an inclined surface on 01/06/22. Pain getting worse and movement decreasing since the injury. Pain located more at base than distal thumb. Patient has been using ice and ibuprofen and verapamil cream. Home pain meds include oxycodone, celebrex 100-200 mg qd, voltaren gel,  verapamil cream.      Plantar fasciitis    Right   Shortness of breath    with exertion   Sleep apnea 5 plus yrs   study -pt could not sleep test inconclusive.    Tear of meniscus of left knee    x2   Tear of meniscus of right knee    Current Outpatient Medications  Medication Sig Dispense Refill   naloxone (NARCAN) 0.4 MG/ML injection Inject 1 mL (0.4 mg total) into the muscle as needed (for opioid overdose or respiratory depression). 1 mL 0   ACCU-CHEK FASTCLIX LANCETS  MISC Check three times a day to check blood sugar. 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 insulin dependent 306 each 1   atorvastatin (LIPITOR) 20 MG tablet Take 1 tablet (20 mg total) by mouth daily. 90 tablet 1   Blood Glucose Monitoring Suppl (TRUE METRIX METER) w/Device KIT Use 3 (three) times daily. 1 kit 1   celecoxib (CELEBREX) 100 MG capsule Take 1 capsule (100 mg total) by mouth daily as needed for moderate pain. 30 capsule 3   cetirizine (ZYRTEC) 10 MG tablet TAKE 1 TABLET BY MOUTH EVERY DAY 30 tablet 11   cyclobenzaprine (FLEXERIL) 10 MG tablet Take 1 tablet (10 mg total) by mouth 2 (two) times daily as needed for muscle spasms. 60 tablet 3   diclofenac Sodium (VOLTAREN) 1 % GEL Apply 4 g topically 4 (four) times daily. 300 g 1   gabapentin (NEURONTIN) 300 MG capsule Take 1 capsule (300 mg total) by mouth at bedtime. 30 capsule 11   glucose blood (ACCU-CHEK GUIDE) test strip Check blood sugar 3 (three) times daily. 100 strip 9   glycopyrrolate (ROBINUL) 1 MG tablet TAKE 1 TABLET BY MOUTH AT BEDTIME. 90 tablet 1   insulin glargine (LANTUS SOLOSTAR) 100 UNIT/ML Solostar Pen Inject 10 Units into the skin daily. 15 mL 1   Insulin Pen Needle (PEN NEEDLES) 32G X 4 MM MISC 1 each by Does not  apply route 5 (five) times daily. 390 each 1   JARDIANCE 25 MG TABS tablet TAKE 1 TABLET BY MOUTH EVERY DAY BEFORE BREAKFAST 90 tablet 1   liraglutide (VICTOZA) 18 MG/3ML SOPN Inject 0.6 mg into the skin daily. 9 mL 3   oxybutynin (DITROPAN-XL) 10 MG 24 hr tablet Take 1 tablet (10 mg total) by mouth daily. 90 tablet 0   Oxycodone HCl 10 MG TABS Take 1 tablet (10 mg total) by mouth every 6 (six) hours as needed. 120 tablet 0   pantoprazole (PROTONIX) 40 MG tablet TAKE 1 TABLET BY MOUTH EVERY DAY 90 tablet 1   No current facility-administered medications for this visit.   Family History  Problem Relation Age of Onset   Breast cancer Sister        both sisters have breast cancer   Social History   Socioeconomic History    Marital status: Married    Spouse name: Not on file   Number of children: Not on file   Years of education: GED +1 yr   Highest education level: Not on file  Occupational History   Occupation: unemployed    Employer: UNEMPLOYED  Tobacco Use   Smoking status: Never   Smokeless tobacco: Never  Vaping Use   Vaping status: Never Used  Substance and Sexual Activity   Alcohol use: No    Alcohol/week: 0.0 standard drinks of alcohol   Drug use: No   Sexual activity: Not on file  Other Topics Concern   Not on file  Social History Narrative   Married, unemployed, Husband disabled and paraplegic 2/2 fall from tree stand while deer hunting, Son quadriplegic 2/2 MVA 05/2007 in [place]   Social Determinants of Health   Financial Resource Strain: Not on file  Food Insecurity: No Food Insecurity (09/06/2022)   Hunger Vital Sign    Worried About Running Out of Food in the Last Year: Never true    Ran Out of Food in the Last Year: Never true  Transportation Needs: No Transportation Needs (09/06/2022)   PRAPARE - Administrator, Civil Service (Medical): No    Lack of Transportation (Non-Medical): No  Physical Activity: Not on file  Stress: Not on file  Social Connections: Unknown (09/06/2022)   Social Connection and Isolation Panel [NHANES]    Frequency of Communication with Friends and Family: More than three times a week    Frequency of Social Gatherings with Friends and Family: More than three times a week    Attends Religious Services: Not on Marketing executive or Organizations: No    Attends Banker Meetings: Never    Marital Status: Married   Review of Systems: ROS negative except for what is noted on the assessment and plan.  Objective:  Physical Exam: Vitals:   03/12/23 0956 03/12/23 1047  BP: (!) 140/66 (!) 163/71  Pulse: 62 64  Temp: 98.2 F (36.8 C)   TempSrc: Oral   SpO2: 98%   Weight: 166 lb 6.4 oz (75.5 kg)   Height: 5\' 2"   (1.575 m)     General appearance: well-appearing Eyes: tracking appropriately Lungs: CTAB, no crackles, no wheeze CV: RRR, no murmur Extremities: No peripheral edema Skin: Normal temperature; no rash MSK: negative Phalen's test, no evidence of L hand/finger contracture Psych: Appropriate affect Neuro: Alert and oriented to person and place, no focal deficit; 5/5 strength in bilateral upper extremity  Assessment & Plan:  Numbness and tingling  in left hand [name] notes one month of intermittent, left hand and forearm tingling. She had a telephone visit on 02/12/2023 for same concern, and started gabapentin 100 mg tid for this concern and her chronic pain. She increased her dose to 200 mg tid on 03/01/23 after noticing some improvement. No change in sx since her telephone visit. History of carpal tunnel s/p release and dupuytren contracture s/p release in the left hand. She denies any pain today or current symptoms during evaluation. When symptoms occur, the entire left hand is numb, sometimes extending upwards to the elbow. She denies tingling in left upper arm or her right hand. She has hx of neck pain but no radiation of pain from the neck down the arm. On exam, there is no swelling compared to right hand. Phalen's test is negative. Normal muscle strength. No evidence of contracture.  Assessment Given hx of CTS and Dupuytren contracture s/p release, there is concern for recurrence and/or fibrosis causing nerve compression. Seems to be coming from the hand/wrist, lower suspicion for shoulder or neck pathology causing symptoms. We discussed need for hand surgery evaluation, however we are limited at this time given lack of insurance coverage. We will manage symptomatically for now and plan to refer when able. Return precautions provided.  Plan - Switch from gabapentin 100 mg tid to 300 mg qhs - Referral to hand surgeon once she gets her insurance  - Asked patient to call clinic if sx  worsen  DM2 (diabetes mellitus, type 2) (HCC) A1c improved from 9.0 > 8.3%. She is currently self-titrating Lantus, using 10-15 units (if glucose 190-200) to 30 units (glucose>250). Very rarely seeing numbers >250. Sometimes not using any insulin at night. She has woken up with symptomatic lows in the middle of the night at least 5 times in the last month. She is measuring BG in the evening, and does not have regular morning readings. She reports not having a glucometer or strips, but has been using her husband's sparingly.   She has been out of Victoza for the past month but is taking Jardiance 25 mg every day. She denies polydipsia and polyuria.   Certainly room for improvement in DM management. We have started today with sending a glucometer and strips to the pharmacy and recommending AM BG checks regularly for the next month. Advised regular, lower dose insulin nightly with 10U Lantus at bedtime. We will resume Victoza and continue Jardiance. Will need to obtain urine ACR when financially feasible. Foot exam and eye exam needed. Foot exam at next visit, patient had to leave the clinic before this could be done today. Eye exam when able with insurance coverage or consider retinopathy screening with Lupita Leash at next visit. Close f/u in one month.  Plan  - 10 units Lantus qhs - continue Jardiance 25 mg and victoza 0.6mg   - Monitor glucose every morning - urine ACR, BMP at future visit - f/u in 4 weeks   Chronic pain Updated controlled substance agreement. Briefly summarized the importance of the contract and need for regular, 13-month visits. Copy given to patient. ToxAssure updated 10/2022. Continuing to work on multimodal pain management with oxycodone, gabapentin, celecoxib, and topical diclofenac. Gabapentin switched to 300 mg at bedtime given nighttime symptoms and drowsiness during the day. Flexeril refilled today as patient has been out. Appointment set with PM&R in September, hopefully will be  able to attend with assistance of [employer] Card/CAFA or Medicaid if approved. Waiting to see Dr. Joelene Millin for RFA  discussion until she has insurance again. Naloxone prescribed today for prn use for overdose and/or respiratory depression. Discussed reasons for use and notifying those near her on use.  Plan -Continue oxycodone 10 mg q6h prn, Celebrex 100 mg daily, topical diclofenac, Flexeril 10 mg bid prn -Gabapentin 300 mg qhs  Transaminitis Elevated ALP/AST/ALT in February 2024, 152/48/53. Hepatitis B/C testing negative and RUQ [name] ordered at visit in April. [name] has not had RUQ [name]. We discussed need for repeat CMP and [name] today, however [name] declined at this time due to lack of insurance and financial resources. No abdominal symptoms or other obvious sequelae of liver disease. Will f/u at next visit.    Weight loss Weight increased to 166 lbs today, up about 20 pounds since last August. CT chest/abd/pelvis ordered at previous visit but not completed. Referred to GI in the past for EGD/CSY but unable to schedule. Did not complete gastric emptying study. There was concern for uncontrolled DM causing ketosis contributing to weight loss last year as well. At this time, would like to monitor weight and assess financial ability to complete some of the above if remains clinically needed. Will need to update age-related cancer screening and work on DM management.  Plan -F/u in one month for weight check -Will need CMP, CBC w/ diff, RUQ [name] as outlined elsewhere -Needs CSY +/- EGD, mammogram  Mixed incontinence urge and stress Remains on oxybutynin which has been working well for incontinence symptoms. [name] again denies relation of this medication to previous dizziness, which is now improved. I had planned to discuss alternative such as mirabegron today, although with no insurance coverage I do not think this is a viable option. Will continue to address at future  appointments.  Long-term current use of opiate analgesic for osteoarthritis and fibromyalgia Updated controlled substance agreement today and reviewed expectations of every 3 month appointments. ToxAssure completed 10/2022. Continue current opioid medication for now as it is helping with function and we are working on multimodal options for additional pain management. Referred to PM&R at last visit, pending. Naloxone prescribed today for risk of overdose and respiratory depression, instructed patient on reason for use.  Generalized hyperhidrosis Improvement in sweating symptoms with resumption of glycopyrrolate following last visit. We discussed need for further evaluation of symptoms with lab work today, however patient declined due to financial situation and lack of insurance coverage. We will need to continue discussion when insurance or assistance is approved.  Plan -Continue glycopyrrolate 1 mg at bedtime -Future CBC w/ diff, smear, CMP  Erythrocytosis Hemoglobin elevated to 16.0 in May 2023 and February 2024. No clear etiology of secondary erythrocytosis noted from patient history. We discussed need to evaluate further with lab evaluation today, however patient declined due to financial burden and lack of insurance coverage. We discussed applying for [facility] and Medicaid, will hopefully be able to complete at f/u visit in one month.  Plan -F/u in one month -CBC w/ diff, EPO, smear  Patient discussed with Dr. Sol Blazing

## 2023-03-12 NOTE — Progress Notes (Signed)
Attestation for Student Documentation:  I personally was present and re-performed the history, physical exam and medical decision-making activities of this service and have verified that the service and findings are accurately documented in the student's note.  Dickie La, MD 03/12/2023, 3:05 PM

## 2023-03-12 NOTE — Assessment & Plan Note (Signed)
Improvement in sweating symptoms with resumption of glycopyrrolate following last visit. We discussed need for further evaluation of symptoms with lab work today, however patient declined due to financial situation and lack of insurance coverage. We will need to continue discussion when insurance or assistance is approved.  Plan -Continue glycopyrrolate 1 mg at bedtime -Future CBC w/ diff, smear, CMP

## 2023-03-12 NOTE — Assessment & Plan Note (Addendum)
Updated controlled substance agreement. Briefly summarized the importance of the contract and need for regular, 21-month visits. Copy given to patient. ToxAssure updated 10/2022. Continuing to work on multimodal pain management with oxycodone, gabapentin, celecoxib, and topical diclofenac. Gabapentin switched to 300 mg at bedtime given nighttime symptoms and drowsiness during the day. Flexeril refilled today as patient has been out. Appointment set with PM&R in September, hopefully will be able to attend with assistance of [employer] Card/CAFA or Medicaid if approved. Waiting to see Dr. Joelene Millin for RFA discussion until she has insurance again. Naloxone prescribed today for prn use for overdose and/or respiratory depression. Discussed reasons for use and notifying those near her on use.  Plan -Continue oxycodone 10 mg q6h prn, Celebrex 100 mg daily, topical diclofenac, Flexeril 10 mg bid prn -Gabapentin 300 mg qhs

## 2023-03-12 NOTE — Patient Instructions (Addendum)
Thank you, [name] for allowing [name] to provide your care today. Today we discussed the following.  Diabetes- Please take Lantus 10 units every night. Continue Victoza 0.6 mg daily and Jardiance 25mg  daily. Please monitor your blood glucose every morning and bring the readings to next visit.  Hand tingling - Please let us know if symptoms are worsening. We will discuss referral to hand surgeon once you have insurance.  Gabapentin - please switch to Gabapentin 300 mg every night.  Med refills - We have sent a refill for Flexeril and Victoza.  Please follow up in 4 weeks and for labs. Apply for [facility] card.   I have ordered the following labs for you:   Lab Orders         POC Hbg A1C      I have ordered the following medication/changed the following medications:   Stop the following medications: Medications Discontinued During This Encounter  Medication Reason   Blood Glucose Monitoring Suppl (ACCU-CHEK GUIDE ME) w/Device KIT Reorder   glucose blood (ACCU-CHEK GUIDE) test strip Reorder     Start the following medications: Meds ordered this encounter  Medications   glucose blood (ACCU-CHEK GUIDE) test strip    Sig: Check blood sugar three times/day.    Dispense:  100 strip    Refill:  9   Blood Glucose Monitoring Suppl (ACCU-CHEK GUIDE ME) w/Device KIT    Sig: 1 each by Does not apply route 3 (three) times daily.    Dispense:  1 kit    Refill:  1     Follow up:  4 weeks    It was wonderful to see you today!  Please contact your pharmacy about scheduling the shingles (Shingrix) and updated COVID-19 vaccines.

## 2023-03-12 NOTE — Assessment & Plan Note (Deleted)
-   Denied BMP due to $46 out of pocket cost  - Check HbA1c

## 2023-03-12 NOTE — Addendum Note (Signed)
Addended by: Dickie La on: 03/12/2023 04:30 PM   Modules accepted: Orders

## 2023-03-12 NOTE — Assessment & Plan Note (Addendum)
A1c improved from 9.0 > 8.3%. She is currently self-titrating Lantus, using 10-15 units (if glucose 190-200) to 30 units (glucose>250). Very rarely seeing numbers >250. Sometimes not using any insulin at night. She has woken up with symptomatic lows in the middle of the night at least 5 times in the last month. She is measuring BG in the evening, and does not have regular morning readings. She reports not having a glucometer or strips, but has been using her husband's sparingly.   She has been out of Victoza for the past month but is taking Jardiance 25 mg every day. She denies polydipsia and polyuria.   Certainly room for improvement in DM management. We have started today with sending a glucometer and strips to the pharmacy and recommending AM BG checks regularly for the next month. Advised regular, lower dose insulin nightly with 10U Lantus at bedtime. We will resume Victoza and continue Jardiance. Will need to obtain urine ACR when financially feasible. Foot exam and eye exam needed. Foot exam at next visit, patient had to leave the clinic before this could be done today. Eye exam when able with insurance coverage or consider retinopathy screening with Lupita Leash at next visit. Close f/u in one month.  Plan  - 10 units Lantus qhs - continue Jardiance 25 mg and victoza 0.6mg   - Monitor glucose every morning - urine ACR, BMP at future visit - f/u in 4 weeks

## 2023-03-12 NOTE — Assessment & Plan Note (Signed)
Weight increased to 166 lbs today, up about 20 pounds since last August. CT chest/abd/pelvis ordered at previous visit but not completed. Referred to GI in the past for EGD/CSY but unable to schedule. Did not complete gastric emptying study. There was concern for uncontrolled DM causing ketosis contributing to weight loss last year as well. At this time, would like to monitor weight and assess financial ability to complete some of the above if remains clinically needed. Will need to update age-related cancer screening and work on DM management.  Plan -F/u in one month for weight check -Will need CMP, CBC w/ diff, RUQ [name] as outlined elsewhere -Needs CSY +/- EGD, mammogram

## 2023-03-12 NOTE — Assessment & Plan Note (Addendum)
[name] notes one month of intermittent, left hand and forearm tingling. She had a telephone visit on 02/12/2023 for same concern, and started gabapentin 100 mg tid for this concern and her chronic pain. She increased her dose to 200 mg tid on 03/01/23 after noticing some improvement. No change in sx since her telephone visit. History of carpal tunnel s/p release and dupuytren contracture s/p release in the left hand. She denies any pain today or current symptoms during evaluation. When symptoms occur, the entire left hand is numb, sometimes extending upwards to the elbow. She denies tingling in left upper arm or her right hand. She has hx of neck pain but no radiation of pain from the neck down the arm. On exam, there is no swelling compared to right hand. Phalen's test is negative. Normal muscle strength. No evidence of contracture.  Assessment Given hx of CTS and Dupuytren contracture s/p release, there is concern for recurrence and/or fibrosis causing nerve compression. Seems to be coming from the hand/wrist, lower suspicion for shoulder or neck pathology causing symptoms. We discussed need for hand surgery evaluation, however we are limited at this time given lack of insurance coverage. We will manage symptomatically for now and plan to refer when able. Return precautions provided.  Plan - Switch from gabapentin 100 mg tid to 300 mg qhs - Referral to hand surgeon once she gets her insurance  - Asked patient to call clinic if sx worsen

## 2023-03-12 NOTE — Assessment & Plan Note (Signed)
Elevated ALP/AST/ALT in February 2024, 152/48/53. Hepatitis B/C testing negative and RUQ [name] ordered at visit in April. Ms. Sene has not had RUQ [name]. We discussed need for repeat CMP and [name] today, however Ms. Bisping declined at this time due to lack of insurance and financial resources. No abdominal symptoms or other obvious sequelae of liver disease. Will f/u at next visit.

## 2023-03-12 NOTE — Assessment & Plan Note (Signed)
Remains on oxybutynin which has been working well for incontinence symptoms. [name] again denies relation of this medication to previous dizziness, which is now improved. I had planned to discuss alternative such as mirabegron today, although with no insurance coverage I do not think this is a viable option. Will continue to address at future appointments.

## 2023-03-12 NOTE — Addendum Note (Signed)
Addended by: Dickie La on: 03/12/2023 04:06 PM   Modules accepted: Orders

## 2023-03-13 ENCOUNTER — Other Ambulatory Visit: Payer: Self-pay

## 2023-03-13 ENCOUNTER — Other Ambulatory Visit: Payer: Self-pay | Admitting: *Deleted

## 2023-03-13 ENCOUNTER — Other Ambulatory Visit (HOSPITAL_COMMUNITY): Payer: Self-pay

## 2023-03-13 DIAGNOSIS — G8929 Other chronic pain: Secondary | ICD-10-CM

## 2023-03-14 ENCOUNTER — Other Ambulatory Visit (HOSPITAL_COMMUNITY): Payer: Self-pay

## 2023-03-14 MED ORDER — OXYCODONE HCL 10 MG PO TABS
10.0000 mg | ORAL_TABLET | Freq: Four times a day (QID) | ORAL | 0 refills | Status: DC | PRN
Start: 2023-03-20 — End: 2023-04-08
  Filled 2023-03-20: qty 120, 30d supply, fill #0

## 2023-03-20 ENCOUNTER — Other Ambulatory Visit: Payer: Self-pay

## 2023-03-20 ENCOUNTER — Other Ambulatory Visit (HOSPITAL_COMMUNITY): Payer: Self-pay

## 2023-03-25 ENCOUNTER — Other Ambulatory Visit: Payer: Self-pay | Admitting: *Deleted

## 2023-03-25 ENCOUNTER — Other Ambulatory Visit (HOSPITAL_COMMUNITY): Payer: Self-pay

## 2023-03-25 ENCOUNTER — Other Ambulatory Visit: Payer: Self-pay | Admitting: Internal Medicine

## 2023-03-25 MED ORDER — CELECOXIB 100 MG PO CAPS
100.0000 mg | ORAL_CAPSULE | Freq: Every day | ORAL | 3 refills | Status: DC | PRN
  Filled 2023-03-25 – 2023-04-23 (×2): qty 30, 30d supply, fill #0

## 2023-03-25 NOTE — Telephone Encounter (Signed)
  celecoxib (CELEBREX) 100 MG capsule   [place] [facility] (Ph: [phone])

## 2023-04-02 ENCOUNTER — Encounter: Payer: Medicaid Other | Admitting: Physical Medicine & Rehabilitation

## 2023-04-04 ENCOUNTER — Other Ambulatory Visit (HOSPITAL_COMMUNITY): Payer: Self-pay

## 2023-04-08 ENCOUNTER — Other Ambulatory Visit: Payer: Self-pay

## 2023-04-08 ENCOUNTER — Other Ambulatory Visit (HOSPITAL_COMMUNITY): Payer: Self-pay

## 2023-04-08 DIAGNOSIS — G8929 Other chronic pain: Secondary | ICD-10-CM

## 2023-04-08 MED ORDER — OXYCODONE HCL 10 MG PO TABS
10.0000 mg | ORAL_TABLET | Freq: Four times a day (QID) | ORAL | 0 refills | Status: DC | PRN
Start: 2023-04-19 — End: 2023-05-27
  Filled 2023-04-23: qty 120, 30d supply, fill #0

## 2023-04-09 ENCOUNTER — Encounter: Payer: Self-pay | Admitting: Internal Medicine

## 2023-04-23 ENCOUNTER — Other Ambulatory Visit (HOSPITAL_COMMUNITY): Payer: Self-pay

## 2023-04-24 ENCOUNTER — Telehealth: Payer: Self-pay | Admitting: *Deleted

## 2023-04-24 ENCOUNTER — Other Ambulatory Visit: Payer: Self-pay | Admitting: *Deleted

## 2023-04-24 NOTE — Telephone Encounter (Signed)
Spoke with patient regarding her mammogram, she would like to waiting until she gets her medicaid before scheduling.

## 2023-05-07 ENCOUNTER — Other Ambulatory Visit (HOSPITAL_COMMUNITY): Payer: Self-pay

## 2023-05-07 ENCOUNTER — Telehealth: Payer: Self-pay | Admitting: *Deleted

## 2023-05-07 ENCOUNTER — Encounter: Payer: Self-pay | Admitting: Internal Medicine

## 2023-05-07 NOTE — Telephone Encounter (Signed)
Pt had requested refills on Gabapentin, cyclobenzaprine, and victoza. I called pt - informed she has has refills and they were sent to [facility] community pharmacy - stated she was not aware.

## 2023-05-08 ENCOUNTER — Other Ambulatory Visit (HOSPITAL_COMMUNITY): Payer: Self-pay

## 2023-05-08 ENCOUNTER — Telehealth: Payer: Self-pay

## 2023-05-08 ENCOUNTER — Other Ambulatory Visit: Payer: Self-pay

## 2023-05-08 DIAGNOSIS — Z794 Long term (current) use of insulin: Secondary | ICD-10-CM

## 2023-05-08 MED ORDER — BASAGLAR KWIKPEN 100 UNIT/ML ~~LOC~~ SOPN
10.0000 [IU] | PEN_INJECTOR | Freq: Every day | SUBCUTANEOUS | 1 refills | Status: DC
Start: 2023-05-08 — End: 2023-06-18
  Filled 2023-05-08: qty 3, 30d supply, fill #0
  Filled 2023-05-23: qty 15, 150d supply, fill #0
  Filled 2023-05-31: qty 3, 30d supply, fill #0

## 2023-05-08 NOTE — Telephone Encounter (Signed)
Victoza to be transferred to [facility] pharmacy for refills per [facility], CPhT. Financial assistance application in process.  Lantus replaced with Basaglar due to availability of financial assistance.

## 2023-05-08 NOTE — Telephone Encounter (Signed)
Victoza and Lantus no longer on IM program at [facility].   Will attempt assistance for Victoza through [employer]. Can Lantus be replaced with Basaglar, and be sent to [facility] pharmacy, with a note to "fill for [facility]"? This is a free stock they are able to fill for uninsured patients.

## 2023-05-08 NOTE — Telephone Encounter (Signed)
Victoza being transferred to [facility] pharmacy to be filled as a surplus while assistance processes.   DOH basaglar sent to [facility] as well in place of Lantus.

## 2023-05-11 ENCOUNTER — Other Ambulatory Visit: Payer: Self-pay | Admitting: Internal Medicine

## 2023-05-11 DIAGNOSIS — E113393 Type 2 diabetes mellitus with moderate nonproliferative diabetic retinopathy without macular edema, bilateral: Secondary | ICD-10-CM

## 2023-05-13 ENCOUNTER — Other Ambulatory Visit (HOSPITAL_COMMUNITY): Payer: Self-pay

## 2023-05-13 NOTE — Telephone Encounter (Signed)
Next appt scheduled 1/23 with PCP.

## 2023-05-17 NOTE — Telephone Encounter (Signed)
Submitted PATIENTS PORTION of application for VICTOZA to [employer] for patient assistance via online portal.  Faxing provider page to office (or placing in pcps box) to complete.   Phone: [phone]

## 2023-05-20 ENCOUNTER — Other Ambulatory Visit: Payer: Self-pay

## 2023-05-21 ENCOUNTER — Other Ambulatory Visit (HOSPITAL_COMMUNITY): Payer: Self-pay

## 2023-05-23 ENCOUNTER — Other Ambulatory Visit: Payer: Self-pay

## 2023-05-23 ENCOUNTER — Other Ambulatory Visit (HOSPITAL_COMMUNITY): Payer: Self-pay

## 2023-05-27 ENCOUNTER — Other Ambulatory Visit (HOSPITAL_COMMUNITY): Payer: Self-pay

## 2023-05-27 ENCOUNTER — Other Ambulatory Visit: Payer: Self-pay

## 2023-05-27 DIAGNOSIS — G8929 Other chronic pain: Secondary | ICD-10-CM

## 2023-05-27 MED ORDER — OXYCODONE HCL 10 MG PO TABS
10.0000 mg | ORAL_TABLET | Freq: Four times a day (QID) | ORAL | 0 refills | Status: DC | PRN
Start: 2023-05-27 — End: 2023-06-18
  Filled 2023-05-27: qty 120, 30d supply, fill #0

## 2023-05-27 NOTE — Telephone Encounter (Signed)
AnnMaria--I don't see any refill requests or information regarding a medication. Was there a medication she needs?

## 2023-05-28 ENCOUNTER — Other Ambulatory Visit: Payer: Self-pay

## 2023-05-28 ENCOUNTER — Other Ambulatory Visit (HOSPITAL_COMMUNITY): Payer: Self-pay

## 2023-05-28 DIAGNOSIS — E113393 Type 2 diabetes mellitus with moderate nonproliferative diabetic retinopathy without macular edema, bilateral: Secondary | ICD-10-CM

## 2023-05-28 MED ORDER — EMPAGLIFLOZIN 25 MG PO TABS
25.0000 mg | ORAL_TABLET | Freq: Every day | ORAL | 1 refills | Status: DC
Start: 2023-05-28 — End: 2023-10-01
  Filled 2023-05-28: qty 30, 30d supply, fill #0
  Filled 2023-07-22 – 2023-09-02 (×2): qty 30, 30d supply, fill #1

## 2023-05-28 NOTE — Telephone Encounter (Addendum)
Patient called requesting a rx refill for Jardiance the rx has recently been sent on 10/[facility] [address]. Patient stated she cannot afford the medication and requested the medication to be sent to [place] community pharmacy- [facility]. Patient was last seen 8/27 and is scheduled to return 12/3.Rx refill has been sent.

## 2023-05-31 ENCOUNTER — Other Ambulatory Visit: Payer: Self-pay

## 2023-06-03 ENCOUNTER — Other Ambulatory Visit: Payer: Self-pay

## 2023-06-05 NOTE — Telephone Encounter (Signed)
Submitted PROVIDER PORTION of application for VICTOZA to [employer] for patient assistance via fax.   Phone: [phone]

## 2023-06-12 NOTE — Progress Notes (Signed)
Pharmacy Medication Assistance Program Note    06/12/2023  Patient ID: [name], female   DOB: 15-Dec-1960, 62 y.o.   MRN: [id number]     05/17/2023 06/05/2023  Outreach Medication One  Initial Outreach Date (Medication One) 05/08/2023   Manufacturer Medication One [employer] [facility] Victoza   Dose of Victoza 0.6MG    Type of Forensic scientist Assistance  Date Application Sent to Prescriber 05/17/2023   Name of Prescriber GRACE LAU   Date Application Submitted to Manufacturer  06/05/2023  Method Application Sent to Manufacturer  Fax  Patient Assistance Determination Approved   Approval Start Date 06/10/2023   Approval End Date 06/01/2024        Medication will ship to [facility]

## 2023-06-18 ENCOUNTER — Encounter: Payer: Self-pay | Admitting: Internal Medicine

## 2023-06-18 ENCOUNTER — Ambulatory Visit (INDEPENDENT_AMBULATORY_CARE_PROVIDER_SITE_OTHER): Payer: Self-pay | Admitting: Internal Medicine

## 2023-06-18 ENCOUNTER — Other Ambulatory Visit: Payer: Self-pay

## 2023-06-18 ENCOUNTER — Other Ambulatory Visit (HOSPITAL_COMMUNITY): Payer: Self-pay

## 2023-06-18 VITALS — BP 138/67 | HR 68 | Temp 98.0°F | Ht 62.0 in | Wt 170.7 lb

## 2023-06-18 DIAGNOSIS — D751 Secondary polycythemia: Secondary | ICD-10-CM | POA: Diagnosis not present

## 2023-06-18 DIAGNOSIS — E113393 Type 2 diabetes mellitus with moderate nonproliferative diabetic retinopathy without macular edema, bilateral: Secondary | ICD-10-CM | POA: Diagnosis present

## 2023-06-18 DIAGNOSIS — Z794 Long term (current) use of insulin: Secondary | ICD-10-CM

## 2023-06-18 DIAGNOSIS — R7401 Elevation of levels of liver transaminase levels: Secondary | ICD-10-CM | POA: Diagnosis not present

## 2023-06-18 DIAGNOSIS — G894 Chronic pain syndrome: Secondary | ICD-10-CM

## 2023-06-18 DIAGNOSIS — R1013 Epigastric pain: Secondary | ICD-10-CM

## 2023-06-18 DIAGNOSIS — Z23 Encounter for immunization: Secondary | ICD-10-CM

## 2023-06-18 DIAGNOSIS — R61 Generalized hyperhidrosis: Secondary | ICD-10-CM

## 2023-06-18 DIAGNOSIS — R634 Abnormal weight loss: Secondary | ICD-10-CM | POA: Diagnosis not present

## 2023-06-18 DIAGNOSIS — E785 Hyperlipidemia, unspecified: Secondary | ICD-10-CM | POA: Diagnosis not present

## 2023-06-18 LAB — POCT GLYCOSYLATED HEMOGLOBIN (HGB A1C): Hemoglobin A1C: 8.8 % — AB (ref 4.0–5.6)

## 2023-06-18 LAB — GLUCOSE, CAPILLARY: Glucose-Capillary: 210 mg/dL — ABNORMAL HIGH (ref 70–99)

## 2023-06-18 MED ORDER — GLYCOPYRROLATE 1 MG PO TABS
1.0000 mg | ORAL_TABLET | Freq: Every day | ORAL | 1 refills | Status: DC
  Filled 2023-06-18: qty 90, 90d supply, fill #0

## 2023-06-18 MED ORDER — CELECOXIB 100 MG PO CAPS
100.0000 mg | ORAL_CAPSULE | Freq: Every day | ORAL | 3 refills | Status: DC | PRN
  Filled 2023-06-18: qty 30, 30d supply, fill #0
  Filled 2023-07-22 – 2023-09-02 (×2): qty 30, 30d supply, fill #1

## 2023-06-18 MED ORDER — BASAGLAR KWIKPEN 100 UNIT/ML ~~LOC~~ SOPN
25.0000 [IU] | PEN_INJECTOR | Freq: Every day | SUBCUTANEOUS | 1 refills | Status: DC
  Filled 2023-06-18: qty 15, 60d supply, fill #0
  Filled 2023-06-25: qty 6, 24d supply, fill #0
  Filled 2023-07-22 – 2023-09-30 (×2): qty 6, 24d supply, fill #1

## 2023-06-18 MED ORDER — CYCLOBENZAPRINE HCL 10 MG PO TABS
10.0000 mg | ORAL_TABLET | Freq: Three times a day (TID) | ORAL | 3 refills | Status: DC | PRN
  Filled 2023-06-18: qty 90, 30d supply, fill #0
  Filled 2023-09-12: qty 90, 30d supply, fill #1

## 2023-06-18 MED ORDER — ATORVASTATIN CALCIUM 20 MG PO TABS
20.0000 mg | ORAL_TABLET | Freq: Every day | ORAL | 1 refills | Status: DC
Start: 2023-06-18 — End: 2023-10-01
  Filled 2023-06-18: qty 90, 90d supply, fill #0

## 2023-06-18 MED ORDER — OXYCODONE HCL 10 MG PO TABS
10.0000 mg | ORAL_TABLET | Freq: Four times a day (QID) | ORAL | 0 refills | Status: DC | PRN
  Filled 2023-06-18 – 2023-06-27 (×2): qty 120, 30d supply, fill #0

## 2023-06-18 MED ORDER — GABAPENTIN 400 MG PO CAPS
400.0000 mg | ORAL_CAPSULE | Freq: Every day | ORAL | 3 refills | Status: DC
  Filled 2023-06-18: qty 30, 30d supply, fill #0
  Filled 2023-07-22 – 2023-07-23 (×2): qty 30, 30d supply, fill #1
  Filled 2023-09-19: qty 30, 30d supply, fill #2

## 2023-06-18 MED ORDER — DICLOFENAC SODIUM 1 % EX GEL
4.0000 g | Freq: Four times a day (QID) | CUTANEOUS | 1 refills | Status: DC
  Filled 2023-06-18: qty 300, 30d supply, fill #0
  Filled 2023-07-22: qty 300, 30d supply, fill #1
  Filled 2023-07-23: qty 100, 10d supply, fill #1
  Filled 2023-11-25: qty 100, 10d supply, fill #2
  Filled 2023-12-30: qty 100, 10d supply, fill #3

## 2023-06-18 MED ORDER — BASAGLAR KWIKPEN 100 UNIT/ML ~~LOC~~ SOPN
25.0000 [IU] | PEN_INJECTOR | Freq: Every day | SUBCUTANEOUS | 1 refills | Status: DC
  Filled 2023-06-18: qty 15, 60d supply, fill #0

## 2023-06-18 NOTE — Assessment & Plan Note (Addendum)
A1c 8.8%. At last visit, basal insulin was lowered to 10 units daily for regular, daily use rather than self-titration in the setting of symptomatic lows. Since she has been without insurance coverage, she had a period of being off medication for >1 month. Since resuming insulin glargine mid-November she has self-titrated to 30-35U nightly with AM glucose 125-130. She has taken this daily other than 3 days when she had a cold. She has had 2-3 symptomatic lows since resuming insulin.  She has also gone directly to Victoza 1.8 mg daily rather than 0.6 mg and is nearing the end of her pen. Receiving financial assistance for this medication. She received a sample of empagliflozin and is awaiting financial assistance.  A1c not fully reflective of treatment with insulin, GLP-1 RA, and SLGT2i given only approximately one month of treatment. She did not bring her meter for review today.  Plan -Victoza 1.8 mg daily -Empagliflozin 25 mg daily -Decrease insulin glargine to 25 units daily -Coordinating medication assistance with Durward Mallard -Urine ACR declined today d/t cost, f/u at next visit. Discuss eye exam when able to get insurance coverage. -[place] Card/CAFA paperwork provided today  Addendum Spoke to [place], no assistance paperwork has been started for [employer]. Will get this started, turnaround may be up to 1 month. New prescription for Victoza sent to [facility] pharmacy for 1.8 daily, will continue at this dose rather than increase further as previously noted. Durward Mallard to work on increasing the dose to 1.8 daily through assistance. New prescription for Basaglar sent to the pharmacy.

## 2023-06-18 NOTE — Progress Notes (Signed)
Established Patient Office Visit  Subjective   Patient ID: [name], female    DOB: May 08, 1961  Age: 62 y.o. MRN: [id number]  Chief Complaint  Patient presents with   Follow-up   Medication Refill   [name] presents to clinic for follow-up of chronic medical conditions. Please see assessment/plan in problem-based charting for further details of today's visit.  [name]' Medicaid coverage was abruptly terminated earlier this year which has made it difficult for her to undergo lab testing, attend appropriate referrals, and afford medications. She has attempted to get coverage reinstated but is having difficulty doing so. We have discussed a MLP referral today for potential legal assistance and patient provided verbal consent to the information provided in MLP form and referral. [name] has also been provided [facility] card/CAFA paperwork today.     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/2017   Restless legs syndrome (RLS) 10/24/2016   Long-term current use of opiate analgesic for osteoarthritis and fibromyalgia 03/01/2016   Dizziness 06/02/2012   Fibromyalgia 05/23/2012   Healthcare maintenance 03/01/2011   Hyperlipidemia 07/27/2010   DM2 (diabetes mellitus, type 2) (HCC) 07/26/2010      Objective:     BP 138/67 (BP Location: Right Arm, Patient Position: Sitting, Cuff Size: Normal)   Pulse 68   Temp 98 F (36.7 C) (Oral)   Ht 5\' 2"  (1.575 m)   Wt 170 lb 11.2 oz (77.4 kg)   LMP 07/04/2017   SpO2 97% Comment: RA   BMI 31.22 kg/m  BP Readings from Last 3 Encounters:  06/18/23 138/67  03/12/23 (!) 163/71  10/31/22 125/66   Wt Readings from Last 3 Encounters:  06/18/23 170 lb 11.2 oz (77.4 kg)  03/12/23 166 lb 6.4 oz (75.5 kg)  10/31/22 157 lb 1.6 oz (71.3 kg)     Physical Exam Constitutional:      General: She is not in acute distress.    Appearance: Normal appearance. She is not ill-appearing.  Pulmonary:     Effort: Pulmonary effort is normal.  Neurological:     General: No focal deficit present.     Mental Status: She is alert.     Gait: Gait normal.       Assessment & Plan:   Problem List Items Addressed This Visit       Endocrine   DM2 (diabetes mellitus, type 2) (HCC) - Primary (Chronic)    A1c 8.8%. At last visit, basal insulin was lowered to 10 units daily for regular, daily use rather than self-titration in the setting of symptomatic lows. Since she has been without insurance coverage, she had a period of being off medication for >1 month. Since resuming insulin glargine mid-November she has self-titrated to 30-35U nightly with AM glucose 125-130. She has taken this daily other than 3 days when she had a cold. She has had 2-3 symptomatic lows since resuming insulin.  She has also gone directly to Victoza 1.8 mg daily rather than 0.6 mg and is nearing the end of her pen. Receiving financial assistance for  this medication. She received a sample of empagliflozin and is awaiting financial assistance.  A1c not fully reflective of treatment with insulin, GLP-1 RA, and SLGT2i given only approximately one month of treatment. She did not bring her meter for review today.  Plan -Victoza 1.8 mg daily -Empagliflozin 25 mg daily -Decrease insulin glargine to 25 units daily -Coordinating medication assistance with Durward Mallard -Urine ACR declined today d/t cost, f/u at next visit. Discuss eye exam when able to get insurance coverage. -[place] Card/CAFA paperwork provided  today  Addendum Spoke to [place], no assistance paperwork has been started for [employer]. Will get this started, turnaround may be up to 1 month. New prescription for Victoza sent to [facility] pharmacy for 1.8 daily, will continue at this dose rather than increase further as previously noted. Durward Mallard to work on increasing the dose to 1.8 daily through assistance. New prescription for Basaglar sent to the pharmacy.       Relevant Medications   atorvastatin (LIPITOR) 20 MG tablet   Insulin Glargine (BASAGLAR KWIKPEN) 100 UNIT/ML   liraglutide (VICTOZA) 18 MG/3ML SOPN   Other Relevant Orders   POC Hbg A1C (Completed)   Glucose, capillary (Completed)     Musculoskeletal and Integument   Generalized hyperhidrosis    Improvement in symptoms with glycopyrrolate. CBC and CMP today for further evaluation. Patient will likely need further lab/screening evaluation once financially feasible.  Plan -Continue glycopyrrolate 1 mg at bedtime -CMP, CBC -Financial assistance paperwork provided today      Relevant Medications   glycopyrrolate (ROBINUL) 1 MG tablet     Other   Hyperlipidemia (Chronic)   Relevant Medications   atorvastatin (LIPITOR) 20 MG tablet   Weight loss    Weight further increased to 170, appears closer to previous baseline. Will need to complete age-appropriate cancer screening when financially able, as well as evaluation of other issues as outlined in today's note.      Transaminitis   Relevant Orders   CMP14 + Anion Gap (Completed)   Chronic pain    Chronic, multisite pain continues but stable. Ms. Brandli has found nightly gabapentin helpful for pain, we discussed increase today. Will need to revisit PM&R referral, RFA with Dr. Joelene Millin, and hand/orthopedic surgery once insurance coverage is available.  Plan -Continue oxycodone 10 mg q6h prn, Celebrex 100 mg daily, topical diclofenac, Flexeril 10 mg tid prn -Gabapentin 400 mg qhs      Relevant Medications    celecoxib (CELEBREX) 100 MG capsule   cyclobenzaprine (FLEXERIL) 10 MG tablet   gabapentin (NEURONTIN) 400 MG capsule   diclofenac Sodium (VOLTAREN) 1 % GEL   Oxycodone HCl 10 MG TABS (Start on 06/27/2023)   Erythrocytosis    We discussed CBC for monitoring of erythrocytosis, patient agreeable. Had planned for additional lab evaluation with smear +/- EPO +/- other, however not financially feasible today. [place] Card/CAFA paperwork provided again and working on [employer].  Plan -CBC      Relevant Orders   CBC with Diff (Completed)   Other Visit Diagnoses     Encounter for immunization       Relevant Orders   Flu vaccine trivalent PF, 6mos and older(Flulaval,Afluria,Fluarix,Fluzone) (Completed)   Dyspepsia           Return in about 4 weeks (around 07/16/2023) for fu DM.    Dickie La, MD

## 2023-06-18 NOTE — Assessment & Plan Note (Signed)
We discussed CBC for monitoring of erythrocytosis, patient agreeable. Had planned for additional lab evaluation with smear +/- EPO +/- other, however not financially feasible today. [place] Card/CAFA paperwork provided again and working on [employer].  Plan -CBC

## 2023-06-18 NOTE — Patient Instructions (Addendum)
It was wonderful to see you today!  Today, we have discussed increasing Victoza for better diabetes control. Continue 1.8 mg daily. I will work to see if we can get you more pens to increase to 2.4 mg daily, as well as more insulin pens.  I will call you with lab results and discuss next steps.  I will place a referral to see if Legal Aid can assist with your Medicaid application.  Refills of medication will be sent to [facility].

## 2023-06-18 NOTE — Assessment & Plan Note (Signed)
Weight further increased to 170, appears closer to previous baseline. Will need to complete age-appropriate cancer screening when financially able, as well as evaluation of other issues as outlined in today's note.

## 2023-06-18 NOTE — Assessment & Plan Note (Addendum)
Improvement in symptoms with glycopyrrolate. CBC and CMP today for further evaluation. Patient will likely need further lab/screening evaluation once financially feasible.  Plan -Continue glycopyrrolate 1 mg at bedtime -CMP, CBC -Financial assistance paperwork provided today

## 2023-06-18 NOTE — Assessment & Plan Note (Signed)
Chronic, multisite pain continues but stable. [name] has found nightly gabapentin helpful for pain, we discussed increase today. Will need to revisit PM&R referral, RFA with Dr. Joelene Millin, and hand/orthopedic surgery once insurance coverage is available.  Plan -Continue oxycodone 10 mg q6h prn, Celebrex 100 mg daily, topical diclofenac, Flexeril 10 mg tid prn -Gabapentin 400 mg qhs

## 2023-06-19 ENCOUNTER — Other Ambulatory Visit: Payer: Self-pay

## 2023-06-19 ENCOUNTER — Telehealth: Payer: Self-pay

## 2023-06-19 ENCOUNTER — Other Ambulatory Visit (HOSPITAL_COMMUNITY): Payer: Self-pay

## 2023-06-19 LAB — CMP14 + ANION GAP
ALT: 25 [IU]/L (ref 0–32)
AST: 27 [IU]/L (ref 0–40)
Albumin: 4.2 g/dL (ref 3.8–4.9)
Alkaline Phosphatase: 131 [IU]/L — ABNORMAL HIGH (ref 44–121)
Anion Gap: 15 mmol/L (ref 10.0–18.0)
BUN/Creatinine Ratio: 16 (ref 12–28)
BUN: 11 mg/dL (ref 8–27)
Bilirubin Total: 0.4 mg/dL (ref 0.0–1.2)
CO2: 24 mmol/L (ref 20–29)
Calcium: 9.6 mg/dL (ref 8.7–10.3)
Chloride: 100 mmol/L (ref 96–106)
Creatinine, Ser: 0.7 mg/dL (ref 0.57–1.00)
Globulin, Total: 2.8 g/dL (ref 1.5–4.5)
Glucose: 222 mg/dL — ABNORMAL HIGH (ref 70–99)
Potassium: 4.1 mmol/L (ref 3.5–5.2)
Sodium: 139 mmol/L (ref 134–144)
Total Protein: 7 g/dL (ref 6.0–8.5)
eGFR: 99 mL/min/{1.73_m2} (ref >59)

## 2023-06-19 LAB — CBC WITH DIFFERENTIAL/PLATELET
Basophils Absolute: 0 10*3/uL (ref 0.0–0.2)
Basos: 0 %
EOS (ABSOLUTE): 0.1 10*3/uL (ref 0.0–0.4)
Eos: 2 %
Hematocrit: 44.3 % (ref 34.0–46.6)
Hemoglobin: 14.5 g/dL (ref 11.1–15.9)
Immature Grans (Abs): 0 10*3/uL (ref 0.0–0.1)
Immature Granulocytes: 0 %
Lymphocytes Absolute: 3.8 10*3/uL — ABNORMAL HIGH (ref 0.7–3.1)
Lymphs: 46 %
MCH: 29.2 pg (ref 26.6–33.0)
MCHC: 32.7 g/dL (ref 31.5–35.7)
MCV: 89 fL (ref 79–97)
Monocytes Absolute: 0.5 10*3/uL (ref 0.1–0.9)
Monocytes: 6 %
Neutrophils Absolute: 3.8 10*3/uL (ref 1.4–7.0)
Neutrophils: 46 %
Platelets: 327 10*3/uL (ref 150–450)
RBC: 4.97 x10E6/uL (ref 3.77–5.28)
RDW: 12.1 % (ref 11.7–15.4)
WBC: 8.2 10*3/uL (ref 3.4–10.8)

## 2023-06-19 MED ORDER — LIRAGLUTIDE 18 MG/3ML ~~LOC~~ SOPN
1.8000 mg | PEN_INJECTOR | Freq: Every day | SUBCUTANEOUS | 3 refills | Status: DC
  Filled 2023-06-19 (×2): qty 9, 30d supply, fill #0
  Filled 2023-07-22 – 2023-09-02 (×3): qty 9, 30d supply, fill #1

## 2023-06-19 MED ORDER — LIRAGLUTIDE 18 MG/3ML ~~LOC~~ SOPN
1.8000 mg | PEN_INJECTOR | Freq: Every day | SUBCUTANEOUS | 3 refills | Status: DC
  Filled 2023-06-19: qty 9, 30d supply, fill #0

## 2023-06-19 NOTE — Progress Notes (Signed)
AST/ALT normalized. Mild elevation in ALP. Will likely still pursue RUQ [name] when able financially. CTM.

## 2023-06-19 NOTE — Telephone Encounter (Signed)
Rec'd completed patient pages of [employer] application for [place] assistance.   Application placed in PCPs box for siganture.

## 2023-06-19 NOTE — Telephone Encounter (Signed)
Faxed Victoza dose increase to [facility]ord[facility].   (Victoza 1.8mg )

## 2023-06-19 NOTE — Addendum Note (Signed)
Addended by: Dickie La on: 06/19/2023 09:20 AM   Modules accepted: Orders

## 2023-06-19 NOTE — Progress Notes (Signed)
Hemoglobin normalized. CTM.

## 2023-06-19 NOTE — Addendum Note (Signed)
Addended by: Dickie La on: 06/19/2023 09:01 AM   Modules accepted: Orders

## 2023-06-25 ENCOUNTER — Other Ambulatory Visit: Payer: Self-pay

## 2023-06-25 ENCOUNTER — Other Ambulatory Visit (HOSPITAL_COMMUNITY): Payer: Self-pay

## 2023-06-26 ENCOUNTER — Other Ambulatory Visit: Payer: Self-pay

## 2023-06-27 ENCOUNTER — Other Ambulatory Visit (HOSPITAL_COMMUNITY): Payer: Self-pay

## 2023-06-27 ENCOUNTER — Other Ambulatory Visit: Payer: Self-pay

## 2023-06-28 ENCOUNTER — Other Ambulatory Visit: Payer: Self-pay

## 2023-07-03 NOTE — Progress Notes (Signed)
Pharmacy Medication Assistance Program Note    07/15/2023  Patient ID: [name], female  DOB: 05-Jun-1961, 62 y.o.  MRN:  [id number]     07/03/2023  Outreach Medication Two  Manufacturer Medication Two [employer]  [employer] Drugs Jardiance  Dose of Jardiance 25MG   Type of Radiographer, therapeutic Assistance  Date Application Sent to Prescriber 06/19/2023  Name of Prescriber GRACE LAU  Date Application Received From Patient 06/19/2023  Application Items Received From Patient Application  Date Application Received From Provider 07/03/2023  Method Application Sent to Manufacturer Fax  Date Application Submitted to Manufacturer 07/03/2023  Patient Assistance Determination Approved  Approval Start Date 07/14/2023     Medication will ship to patients home.

## 2023-07-22 ENCOUNTER — Other Ambulatory Visit (HOSPITAL_COMMUNITY): Payer: Self-pay

## 2023-07-22 ENCOUNTER — Other Ambulatory Visit: Payer: Self-pay

## 2023-07-22 ENCOUNTER — Other Ambulatory Visit: Payer: Self-pay | Admitting: Internal Medicine

## 2023-07-22 ENCOUNTER — Encounter (HOSPITAL_COMMUNITY): Payer: Self-pay

## 2023-07-22 DIAGNOSIS — E1165 Type 2 diabetes mellitus with hyperglycemia: Secondary | ICD-10-CM

## 2023-07-22 NOTE — Telephone Encounter (Signed)
 Rx last refilled on 03/12/23 with 9 refills.

## 2023-07-23 ENCOUNTER — Other Ambulatory Visit: Payer: Self-pay

## 2023-07-23 ENCOUNTER — Other Ambulatory Visit (HOSPITAL_COMMUNITY): Payer: Self-pay

## 2023-07-23 ENCOUNTER — Other Ambulatory Visit (HOSPITAL_BASED_OUTPATIENT_CLINIC_OR_DEPARTMENT_OTHER): Payer: Self-pay

## 2023-07-24 ENCOUNTER — Other Ambulatory Visit (HOSPITAL_BASED_OUTPATIENT_CLINIC_OR_DEPARTMENT_OTHER): Payer: Self-pay

## 2023-07-24 ENCOUNTER — Other Ambulatory Visit (HOSPITAL_COMMUNITY): Payer: Self-pay

## 2023-07-24 ENCOUNTER — Telehealth: Payer: Self-pay

## 2023-07-24 NOTE — Telephone Encounter (Signed)
 Informed patient her novo nordisk shipment is ready for pickup here at the office.  2 boxes (40 day supply) ready in med room fridge.

## 2023-07-26 ENCOUNTER — Other Ambulatory Visit (HOSPITAL_COMMUNITY): Payer: Self-pay

## 2023-07-29 ENCOUNTER — Other Ambulatory Visit: Payer: Self-pay

## 2023-07-29 ENCOUNTER — Other Ambulatory Visit (HOSPITAL_COMMUNITY): Payer: Self-pay

## 2023-07-29 DIAGNOSIS — G894 Chronic pain syndrome: Secondary | ICD-10-CM

## 2023-07-29 MED ORDER — OXYCODONE HCL 10 MG PO TABS
10.0000 mg | ORAL_TABLET | Freq: Four times a day (QID) | ORAL | 0 refills | Status: DC | PRN
  Filled 2023-07-29 – 2023-08-01 (×2): qty 120, 30d supply, fill #0

## 2023-07-30 ENCOUNTER — Other Ambulatory Visit: Payer: Self-pay

## 2023-08-01 ENCOUNTER — Other Ambulatory Visit (HOSPITAL_COMMUNITY): Payer: Self-pay

## 2023-08-01 MED ORDER — TRUE METRIX BLOOD GLUCOSE TEST VI STRP
ORAL_STRIP | Freq: Three times a day (TID) | 8 refills | Status: DC
  Filled 2023-08-01: qty 100, 34d supply, fill #0
  Filled 2023-09-19: qty 100, 34d supply, fill #1

## 2023-08-26 ENCOUNTER — Ambulatory Visit: Payer: Medicaid Other | Admitting: Podiatry

## 2023-08-27 ENCOUNTER — Other Ambulatory Visit: Payer: Self-pay

## 2023-08-27 ENCOUNTER — Other Ambulatory Visit (HOSPITAL_COMMUNITY): Payer: Self-pay

## 2023-08-27 DIAGNOSIS — G894 Chronic pain syndrome: Secondary | ICD-10-CM

## 2023-08-27 MED ORDER — OXYCODONE HCL 10 MG PO TABS
10.0000 mg | ORAL_TABLET | Freq: Four times a day (QID) | ORAL | 0 refills | Status: DC | PRN
  Filled 2023-08-27 – 2023-09-02 (×2): qty 120, 30d supply, fill #0

## 2023-08-28 ENCOUNTER — Ambulatory Visit: Payer: Medicaid Other | Admitting: Podiatry

## 2023-08-28 ENCOUNTER — Other Ambulatory Visit (HOSPITAL_COMMUNITY): Payer: Self-pay

## 2023-08-28 NOTE — Telephone Encounter (Signed)
Patient now has medicaid for 2025.   Medication also being discontinued on novo nordisk patient assistance. Medication can be filled at pharmacy.

## 2023-09-02 ENCOUNTER — Other Ambulatory Visit: Payer: Self-pay

## 2023-09-02 ENCOUNTER — Telehealth: Payer: Self-pay

## 2023-09-02 ENCOUNTER — Other Ambulatory Visit (HOSPITAL_COMMUNITY): Payer: Self-pay

## 2023-09-02 NOTE — Telephone Encounter (Addendum)
Patient called she stated she went to the pharmacy to pick up her victoza and the pharmacists told her that we take care of victoza. Are we still taking care of her victoza here?

## 2023-09-03 ENCOUNTER — Other Ambulatory Visit (HOSPITAL_COMMUNITY): Payer: Self-pay

## 2023-09-03 ENCOUNTER — Ambulatory Visit: Payer: Medicaid Other | Admitting: Orthopaedic Surgery

## 2023-09-03 NOTE — Telephone Encounter (Signed)
2 boxes- 6 pens are ready to be picked up, the samples have been signed out by Dr.Vincent. I have called the patient, unable to reach her I lvm letting her know that her samples are ready and that we close at 4:30 this afternoon and we will be closing at 12pm tomorrow.

## 2023-09-03 NOTE — Telephone Encounter (Signed)
[name], me and [name] went and looked in the refrigerator for her victoza and we didn't not see anything for [name].

## 2023-09-04 ENCOUNTER — Telehealth: Payer: Self-pay

## 2023-09-04 ENCOUNTER — Other Ambulatory Visit (HOSPITAL_COMMUNITY): Payer: Self-pay

## 2023-09-04 NOTE — Telephone Encounter (Signed)
Pharmacy Patient Advocate Encounter  Received notification from [facility] that Prior Authorization for LIRAGLUTIDE has been DENIED.  Full denial letter will be uploaded to the media tab. See denial reason below.  We may be able to approve this drug in certain situations (when you have tried two certain other drugs first and they did not work Location manager preferred products, such as Engineer, maintenance, Trulicity pen, brand Victoza pen, Ozempic injection]; or when you cannot use certain other drugs [a clinical reason that preferred products cannot be tried]).   Will resubmit for brand victoza.  PA #/Case ID/Reference #: [id number]

## 2023-09-04 NOTE — Telephone Encounter (Signed)
Pharmacy Patient Advocate Encounter   Received notification from Patient Pharmacy that prior authorization for LIRAGLUTIDE [facility]) is required/requested.   Insurance verification completed.   The patient is insured through [facility] [employer] .   PA required; PA submitted to above mentioned insurance via CoverMyMeds Key/confirmation #/EOC [id number]. Status is pending

## 2023-09-04 NOTE — Telephone Encounter (Signed)
Pharmacy Patient Advocate Encounter  Received notification from [facility] that Prior Authorization for VICTOZA [facility]) has been APPROVED from 09/04/23 to 09/03/24.   KEY: [id number]

## 2023-09-04 NOTE — Telephone Encounter (Signed)
Pharmacy Patient Advocate Encounter  Received notification from [facility] that Prior Authorization for VICTOZA [facility]) has been DENIED.  Full denial letter will be uploaded to the media tab. See denial reason below.  It is denied for the following reason: because we did not see what we need to approve the drug you asked for, (Victoza). We may be able to approve this drug when we see certain records (records that you got better while on the requested drug). If we receive these records, we may need more information.   Will make third attempt to process PA as an INITIATION instead of a continuation of therapy.

## 2023-09-04 NOTE — Telephone Encounter (Signed)
Submitted new PA for brand Victoza  Key [phone]

## 2023-09-05 ENCOUNTER — Ambulatory Visit: Payer: Medicaid Other | Admitting: Orthopaedic Surgery

## 2023-09-05 NOTE — Progress Notes (Deleted)
Office Visit Note   Patient: [name]           Date of Birth: Apr 24, 1961           MRN: [id number] Visit Date: 09/05/2023              Requested by: Dickie La, MD [address] [place],  [place] [address] PCP: Dickie La, MD   Assessment & Plan: Visit Diagnoses: No diagnosis found.  Plan: ***  Follow-Up Instructions: No follow-ups on file.   Orders:  No orders of the defined types were placed in this encounter.  No orders of the defined types were placed in this encounter.     Procedures: No procedures performed   Clinical Data: No additional findings.   Subjective: No chief complaint on file.   HPI  Review of Systems   Objective: Vital Signs: LMP 07/04/2017   Physical Exam  Ortho Exam  Specialty Comments:  No specialty comments available.  Imaging: No results found.   PMFS History: 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/2017   Restless legs syndrome (RLS) 10/24/2016   Long-term current use of opiate analgesic for osteoarthritis and fibromyalgia 03/01/2016   Dizziness 06/02/2012   Fibromyalgia 05/23/2012   Healthcare maintenance 03/01/2011   Hyperlipidemia 07/27/2010   DM2 (diabetes mellitus, type 2) (HCC) 07/26/2010   Past Medical History:  Diagnosis Date   Adhesive capsulitis of right shoulder    with underlying tendinopathy rotator cuff   Arthritis    Diabetes mellitus    oral tx   Fibromyalgia    GERD (gastroesophageal reflux disease)    History of post-sterilization tuboplasty  2000   Pain of left thumb 01/10/2022   Acute pain in the left thumb after fall on an inclined surface on 01/06/22. Pain getting worse and movement decreasing since the injury. Pain located more at base than distal thumb. Patient has been using ice and ibuprofen and verapamil cream. Home pain meds include oxycodone, celebrex 100-200 mg qd, voltaren gel,  verapamil cream.      Plantar fasciitis    Right   Shortness of breath    with exertion   Sleep apnea 5 plus yrs   study -pt could not sleep test inconclusive.    Tear of meniscus of left knee    x2   Tear of meniscus of right knee     Family History  Problem Relation Age of Onset   Breast cancer Sister        both sisters have breast cancer    Past Surgical History:  Procedure Laterality Date   CARPAL TUNNEL RELEASE Bilateral    CHOLECYSTECTOMY     FOOT TENDON SURGERY     KNEE ARTHROSCOPY  10/10/2011   Procedure: ARTHROSCOPY KNEE;  Surgeon: Nadara Mustard, MD;  Location: [facility] OR;  Service: Orthopedics;  Laterality: Right;  Right Knee Arthroscopy and Excision Medial Meniscus   MENISCUS REPAIR     R and L (L x2)   ROTATOR CUFF REPAIR Left    TONSILLECTOMY  TOTAL KNEE ARTHROPLASTY Left 06/06/2020   Procedure: LEFT TOTAL KNEE ARTHROPLASTY;  Surgeon: Tarry Kos, MD;  Location: [facility] OR;  Service: Orthopedics;  Laterality: Left;   TUBAL LIGATION     Social History   Occupational History   Occupation: unemployed    Associate Professor: UNEMPLOYED  Tobacco Use   Smoking status: Never   Smokeless tobacco: Never  Vaping Use   Vaping status: Never Used  Substance and Sexual Activity   Alcohol use: No    Alcohol/week: 0.0 standard drinks of alcohol   Drug use: No   Sexual activity: Not on file

## 2023-09-10 NOTE — Progress Notes (Unsigned)
 Office Visit Note   Patient: [name]           Date of Birth: 02/01/1961           MRN: [id number] Visit Date: 09/12/2023              Requested by:  Dickie La, MD [address] [place],  [place] [address]  PCP: Dickie La, MD   Assessment & Plan: Visit Diagnoses: No diagnosis found.  Plan: ***  Follow-Up Instructions: No follow-ups on file.   Orders:  No orders of the defined types were placed in this encounter. No orders of the defined types were placed in this encounter.    Procedures: No procedures performed   Clinical Data: No additional findings.   Subjective: No chief complaint on file.  HPI  Review of Systems   Objective: Vital Signs: LMP 07/04/2017   Physical Exam  Ortho Exam  Specialty Comments:  No specialty comments available.  Imaging: No results found.   PMFS History: Patient Active Problem List   Diagnosis Date Noted  . Erythrocytosis 03/12/2023  . Chronic pain 02/12/2023  . Numbness and tingling in left hand 02/12/2023  . Bilateral hearing loss 02/12/2023  . Transaminitis 09/08/2022  . Generalized hyperhidrosis 06/12/2021  . Weight loss 12/25/2020  . Osteoarthritis 06/05/2020  . Obesity (BMI 30-39.9) 04/02/2019  . Mixed incontinence urge and stress 01/01/2019  . Left hip pain 12/05/2017  . Chronic midline low back pain with bilateral sciatica 01/31/2017  . Restless legs syndrome (RLS) 10/24/2016  . Long-term current use of opiate analgesic for osteoarthritis and fibromyalgia 03/01/2016  . Dizziness 06/02/2012  . Fibromyalgia 05/23/2012  . Healthcare maintenance 03/01/2011  . Hyperlipidemia 07/27/2010  . DM2 (diabetes mellitus, type 2) (HCC) 07/26/2010   Past Medical History:  Diagnosis Date  . 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 Arthritis   . Diabetes mellitus    oral tx  . Fibromyalgia   . GERD (gastroesophageal reflux disease)   . History of  post-sterilization tuboplasty 2000  . Pain of left thumb 01/10/2022   Acute pain in the left thumb after fall on an inclined surface on 01/06/22. Pain getting worse and movement decreasing since the injury. Pain located more at base than distal thumb. Patient has been using ice and ibuprofen and verapamil cream. Home pain meds include oxycodone, celebrex 100-200 mg qd, voltaren gel,  verapamil cream.     . Plantar fasciitis    Right  . Shortness of breath    with exertion  . Sleep apnea 5 plus yrs   study -pt could not sleep test inconclusive.   . Tear of meniscus of left knee    x2  . Tear of meniscus of right knee     Family History  Problem Relation Age of Onset  . Breast cancer Sister        both sisters have breast cancer    Past Surgical History:  Procedure Laterality Date  . CARPAL TUNNEL RELEASE Bilateral   . CHOLECYSTECTOMY    . FOOT TENDON SURGERY    . KNEE ARTHROSCOPY  10/10/2011   Procedure: ARTHROSCOPY KNEE;  Surgeon: Nadara Mustard, MD;  Location: [facility] OR;  Service: Orthopedics;  Laterality: Right;  Right Knee Arthroscopy and Excision Medial Meniscus  . MENISCUS REPAIR     R and L (L x2)  . ROTATOR CUFF REPAIR Left   . TONSILLECTOMY    .  TOTAL KNEE ARTHROPLASTY Left 06/06/2020   Procedure: LEFT TOTAL KNEE ARTHROPLASTY;  Surgeon: Tarry Kos, MD;  Location: [facility] OR;  Service: Orthopedics;  Laterality: Left;  . TUBAL LIGATION     Social History   Occupational History  . Occupation: unemployed    Associate Professor: UNEMPLOYED  Tobacco Use  . Smoking status: Never  . Smokeless tobacco: Never  Vaping Use  . Vaping status: Never Used  Substance and Sexual Activity  . Alcohol use: No    Alcohol/week: 0.0 standard drinks of alcohol  . Drug use: No  . Sexual activity: Not on file

## 2023-09-12 ENCOUNTER — Other Ambulatory Visit: Payer: Self-pay

## 2023-09-12 ENCOUNTER — Other Ambulatory Visit (HOSPITAL_BASED_OUTPATIENT_CLINIC_OR_DEPARTMENT_OTHER): Payer: Self-pay

## 2023-09-12 ENCOUNTER — Encounter: Payer: Self-pay | Admitting: Orthopaedic Surgery

## 2023-09-12 ENCOUNTER — Ambulatory Visit: Payer: Medicaid Other | Admitting: Orthopaedic Surgery

## 2023-09-12 ENCOUNTER — Other Ambulatory Visit (INDEPENDENT_AMBULATORY_CARE_PROVIDER_SITE_OTHER): Payer: Self-pay

## 2023-09-12 DIAGNOSIS — M25552 Pain in left hip: Secondary | ICD-10-CM

## 2023-09-12 DIAGNOSIS — M25551 Pain in right hip: Secondary | ICD-10-CM | POA: Diagnosis not present

## 2023-09-12 NOTE — Telephone Encounter (Signed)
 Pt picked up 2 boxes of Victoza.

## 2023-09-16 ENCOUNTER — Ambulatory Visit: Payer: Medicaid Other | Admitting: Podiatry

## 2023-09-17 ENCOUNTER — Encounter: Payer: Medicaid Other | Admitting: Student

## 2023-09-19 ENCOUNTER — Other Ambulatory Visit: Payer: Self-pay

## 2023-09-22 NOTE — Progress Notes (Unsigned)
 Office Visit Note   Patient: [name]           Date of Birth: 1961-05-23           MRN: [id number] Visit Date: 09/24/2023              Requested by:  Dickie La, MD [address] [place],  [place] [address]  PCP: Dickie La, MD   Assessment & Plan: Visit Diagnoses: No diagnosis found.  Plan: ***  Follow-Up Instructions: No follow-ups on file.   Orders:  No orders of the defined types were placed in this encounter. No orders of the defined types were placed in this encounter.    Procedures: No procedures performed   Clinical Data: No additional findings.   Subjective: No chief complaint on file.  HPI  Review of Systems   Objective: Vital Signs: LMP 07/04/2017   Physical Exam  Ortho Exam  Specialty Comments:  No specialty comments available.  Imaging: No results found.   PMFS History: Patient Active Problem List   Diagnosis Date Noted  . Erythrocytosis 03/12/2023  . Chronic pain 02/12/2023  . Numbness and tingling in left hand 02/12/2023  . Bilateral hearing loss 02/12/2023  . Transaminitis 09/08/2022  . Generalized hyperhidrosis 06/12/2021  . Weight loss 12/25/2020  . Osteoarthritis 06/05/2020  . Obesity (BMI 30-39.9) 04/02/2019  . Mixed incontinence urge and stress 01/01/2019  . Left hip pain 12/05/2017  . Chronic midline low back pain with bilateral sciatica 01/31/2017  . Restless legs syndrome (RLS) 10/24/2016  . Long-term current use of opiate analgesic for osteoarthritis and fibromyalgia 03/01/2016  . Dizziness 06/02/2012  . Fibromyalgia 05/23/2012  . Healthcare maintenance 03/01/2011  . Hyperlipidemia 07/27/2010  . DM2 (diabetes mellitus, type 2) (HCC) 07/26/2010   Past Medical History:  Diagnosis Date  . 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 Arthritis   . Diabetes mellitus    oral tx  . Fibromyalgia   . GERD (gastroesophageal reflux disease)   . History of  post-sterilization tuboplasty 2000  . Pain of left thumb 01/10/2022   Acute pain in the left thumb after fall on an inclined surface on 01/06/22. Pain getting worse and movement decreasing since the injury. Pain located more at base than distal thumb. Patient has been using ice and ibuprofen and verapamil cream. Home pain meds include oxycodone, celebrex 100-200 mg qd, voltaren gel,  verapamil cream.     . Plantar fasciitis    Right  . Shortness of breath    with exertion  . Sleep apnea 5 plus yrs   study -pt could not sleep test inconclusive.   . Tear of meniscus of left knee    x2  . Tear of meniscus of right knee     Family History  Problem Relation Age of Onset  . Breast cancer Sister        both sisters have breast cancer    Past Surgical History:  Procedure Laterality Date  . CARPAL TUNNEL RELEASE Bilateral   . CHOLECYSTECTOMY    . FOOT TENDON SURGERY    . KNEE ARTHROSCOPY  10/10/2011   Procedure: ARTHROSCOPY KNEE;  Surgeon: Nadara Mustard, MD;  Location: [facility] OR;  Service: Orthopedics;  Laterality: Right;  Right Knee Arthroscopy and Excision Medial Meniscus  . MENISCUS REPAIR     R and L (L x2)  . ROTATOR CUFF REPAIR Left   . TONSILLECTOMY    .  TOTAL KNEE ARTHROPLASTY Left 06/06/2020   Procedure: LEFT TOTAL KNEE ARTHROPLASTY;  Surgeon: Tarry Kos, MD;  Location: [facility] OR;  Service: Orthopedics;  Laterality: Left;  . TUBAL LIGATION     Social History   Occupational History  . Occupation: unemployed    Associate Professor: UNEMPLOYED  Tobacco Use  . Smoking status: Never  . Smokeless tobacco: Never  Vaping Use  . Vaping status: Never Used  Substance and Sexual Activity  . Alcohol use: No    Alcohol/week: 0.0 standard drinks of alcohol  . Drug use: No  . Sexual activity: Not on file

## 2023-09-23 ENCOUNTER — Other Ambulatory Visit: Payer: Self-pay

## 2023-09-24 ENCOUNTER — Encounter: Payer: Medicaid Other | Admitting: Orthopaedic Surgery

## 2023-09-24 ENCOUNTER — Encounter: Payer: Medicaid Other | Admitting: Internal Medicine

## 2023-09-25 ENCOUNTER — Encounter: Payer: Self-pay | Admitting: Podiatry

## 2023-09-25 ENCOUNTER — Ambulatory Visit (INDEPENDENT_AMBULATORY_CARE_PROVIDER_SITE_OTHER): Admitting: Podiatry

## 2023-09-25 DIAGNOSIS — M722 Plantar fascial fibromatosis: Secondary | ICD-10-CM

## 2023-09-25 NOTE — Progress Notes (Signed)
 Chief Complaint  Patient presents with   Foot Pain    Patient states she has fibromatosis in bilateral feet and it is genetics she states. She is interested in getting surgery, [place] and it worked well for about 5 years patient states.    HPI: 63 y.o. female presenting today for follow-up evaluation of pain and tenderness associated to the plantar arches of the bilateral feet.  She has a history of excision of plantar fibromas to the left foot and she has done very well with this.  Slowly over time the fibromas have returned and the right is more symptomatic than the left.  She presents today to discuss possible surgical excision of the plantar fibromas to the right foot since her left foot did so well and alleviated a lot of her symptoms  Past Medical History:  Diagnosis Date   Adhesive capsulitis of right shoulder    with underlying tendinopathy rotator cuff   Arthritis    Diabetes mellitus    oral tx   Fibromyalgia    GERD (gastroesophageal reflux disease)    History of post-sterilization tuboplasty 2000   Pain of left thumb 01/10/2022   Acute pain in the left thumb after fall on an inclined surface on 01/06/22. Pain getting worse and movement decreasing since the injury. Pain located more at base than distal thumb. Patient has been using ice and ibuprofen and verapamil cream. Home pain meds include oxycodone, celebrex 100-200 mg qd, voltaren gel,  verapamil cream.      Plantar fasciitis    Right   Shortness of breath    with exertion   Sleep apnea 5 plus yrs   study -pt could not sleep test inconclusive.    Tear of meniscus of left knee    x2   Tear of meniscus of right knee     Past Surgical History:  Procedure Laterality Date   CARPAL TUNNEL RELEASE Bilateral    CHOLECYSTECTOMY     FOOT TENDON SURGERY     KNEE ARTHROSCOPY  10/10/2011   Procedure: ARTHROSCOPY KNEE;  Surgeon: Nadara Mustard, MD;  Location: [facility] OR;  Service:  Orthopedics;  Laterality: Right;  Right Knee Arthroscopy and Excision Medial Meniscus   MENISCUS REPAIR     R and L (L x2)   ROTATOR CUFF REPAIR Left    TONSILLECTOMY     TOTAL KNEE ARTHROPLASTY Left 06/06/2020   Procedure: LEFT TOTAL KNEE ARTHROPLASTY;  Surgeon: Tarry Kos, MD;  Location: [facility] OR;  Service: Orthopedics;  Laterality: Left;   TUBAL LIGATION      Allergies  Allergen Reactions   Byetta 10 Mcg Pen [Exenatide] Anaphylaxis    Throat swelling, diaphoresis, and rash   Metformin Shortness Of Breath, Diarrhea, Nausea And Vomiting and Rash   Mango Butter Hives and Rash    Throat shuts down   Ofloxacin Rash    Throat itching, tightness. Has tolerated Ciprofloxacin.     Physical Exam: General: The patient is alert and oriented x3 in no acute distress.  Dermatology: Skin is warm, dry and supple bilateral lower extremities.   Vascular: Palpable pedal pulses bilaterally. Capillary refill within normal limits.  No appreciable edema.  No erythema.  Neurological: Grossly intact via light touch.  There is paresthesia noted along the plantar arch of the left foot from prior excision of plantar fibroma surgery  Musculoskeletal Exam: Symptomatic tender not adhered palpable lesions noted along the plantar  fascia bilateral consistent with plantar fascial fibromatosis  Assessment/Plan of Care: 1.  Plantar fascial fibromatosis bilateral 2.  History of excision of plantar fibromas LT ~2019  -Patient evaluated. -After discussing with the patient I do believe that excision of the plantar fibromas is necessary and a viable option.  She has a history of chronic foot plantar fibromas with excision of fibromas to the left foot and she did very well with this.  She has tried multiple conservative treatment modalities including arch supports and shoe gear modifications with no improvement -Risk benefits advantages and disadvantages the procedure were explained in detail to the patient.  No  guarantees were expressed or implied -Authorization for surgery was initiated today.  Surgery will consist of excision of plantar fibromas right foot -Return to clinic 1 week postop  *On pain management.  No opioids prescribed   Felecia Shelling, DPM [facility]  Dr. Felecia Shelling, DPM    [address] [place], [place] [address]                Office [phone]  Fax [phone]

## 2023-09-26 ENCOUNTER — Ambulatory Visit: Payer: Medicaid Other | Admitting: Sports Medicine

## 2023-09-26 ENCOUNTER — Encounter: Payer: Self-pay | Admitting: Sports Medicine

## 2023-09-26 ENCOUNTER — Ambulatory Visit: Payer: Self-pay

## 2023-09-26 DIAGNOSIS — M25552 Pain in left hip: Secondary | ICD-10-CM

## 2023-09-26 DIAGNOSIS — M25551 Pain in right hip: Secondary | ICD-10-CM

## 2023-09-26 DIAGNOSIS — M1612 Unilateral primary osteoarthritis, left hip: Secondary | ICD-10-CM | POA: Diagnosis not present

## 2023-09-26 MED ORDER — LIDOCAINE HCL 1 % IJ SOLN
4.0000 mL | INTRAMUSCULAR | Status: AC | PRN
  Administered 2023-09-26: 4 mL

## 2023-09-26 MED ORDER — METHYLPREDNISOLONE ACETATE 40 MG/ML IJ SUSP
40.0000 mg | INTRAMUSCULAR | Status: AC | PRN
  Administered 2023-09-26: 40 mg via INTRA_ARTICULAR

## 2023-09-26 NOTE — Progress Notes (Signed)
   Procedure Note  Patient: [name]             Date of Birth: 05-31-1961           MRN: [id number]             Visit Date: 09/26/2023  Procedures: Visit Diagnoses:  1. Unilateral primary osteoarthritis, left hip   2. Bilateral hip pain    Large Joint Inj: L hip joint on 09/26/2023 10:24 AM Indications: pain Details: 22 G 3.5 in needle, ultrasound-guided anterior approach Medications: 4 mL lidocaine 1 %; 40 mg methylPREDNISolone acetate 40 MG/ML Outcome: tolerated well, no immediate complications  Procedure: US-guided intra-articular hip injection, hip After discussion on risks/benefits/indications and informed verbal consent was obtained, a timeout was performed. Patient was lying supine on exam table. The hip was cleaned with betadine and alcohol swabs. Then utilizing ultrasound guidance, the patient's femoral head and neck junction was identified and subsequently injected with 4:1 lidocaine:depomedrol via an in-plane approach with ultrasound visualization of the injectate administered into the hip joint. Patient tolerated procedure well without immediate complications.  Procedure, treatment alternatives, risks and benefits explained, specific risks discussed. Consent was given by the patient. Immediately prior to procedure a time out was called to verify the correct patient, procedure, equipment, support staff and site/side marked as required. Patient was prepped and draped in the usual sterile fashion.     - post-injection protocol discussed - she did mention having lateral hip pain as well, but not as bothersome. If this does not calm down after hip injection she can present in next few weeks for f/u in 2 weeks or so - will check her sugars, we discussed transient bG increase from CS injection  Madelyn Brunner, DO Primary Care Sports Medicine Physician  [facility] - Orthopedics  This note was dictated using Dragon naturally speaking software and may contain  errors in syntax, spelling, or content which have not been identified prior to signing this note.

## 2023-09-30 ENCOUNTER — Other Ambulatory Visit: Payer: Self-pay

## 2023-09-30 ENCOUNTER — Telehealth: Payer: Self-pay

## 2023-09-30 ENCOUNTER — Other Ambulatory Visit (HOSPITAL_COMMUNITY): Payer: Self-pay

## 2023-09-30 ENCOUNTER — Other Ambulatory Visit: Payer: Self-pay | Admitting: Internal Medicine

## 2023-09-30 DIAGNOSIS — G894 Chronic pain syndrome: Secondary | ICD-10-CM

## 2023-09-30 MED ORDER — OXYCODONE HCL 10 MG PO TABS
10.0000 mg | ORAL_TABLET | Freq: Four times a day (QID) | ORAL | 0 refills | Status: DC | PRN
  Filled 2023-09-30 – 2023-10-02 (×2): qty 120, 30d supply, fill #0

## 2023-09-30 NOTE — Telephone Encounter (Signed)
 I do not see a refill request before today. I have refilled the medication to patient's pharmacy. Patient needs to reschedule her 3 month appointment with me. Please call to schedule.

## 2023-09-30 NOTE — Telephone Encounter (Signed)
 Copied from CRM [id number]. Topic: Clinical - Medication Refill >> Sep 30, 2023  2:48 PM [name] wrote: Most Recent Primary Care Visit:  Provider: Sol Blazing, GRACE  Department: [facility]  Visit Type: OPEN ESTABLISHED  Date: 06/18/2023  Medication: Oxycodone HCl 10 MG TABS  Has the patient contacted their pharmacy? Yes, pharmacy states they haven't received anything.  (Agent: If no, request that the patient contact the pharmacy for the refill. If patient does not wish to contact the pharmacy document the reason why and proceed with request.) (Agent: If yes, when and what did the pharmacy advise?)  Is this the correct pharmacy for this prescription? Yes If no, delete pharmacy and type the correct one.  This is the patient's preferred pharmacy:  [place] [facility] [address] [place] [address] Phone: [phone] Fax: [phone]   Has the prescription been filled recently? No  Is the patient out of the medication? Yes  Has the patient been seen for an appointment in the last year OR does the patient have an upcoming appointment? No  Can we respond through MyChart? Yes  Agent: Please be advised that Rx refills may take up to 3 business days. We ask that you follow-up with your pharmacy.

## 2023-09-30 NOTE — Telephone Encounter (Signed)
 Last Fill: Today-does not appear to have been received by pharmacy    Routing to provider for review/authorization.

## 2023-09-30 NOTE — Telephone Encounter (Signed)
 Copied from CRM [id number]. Topic: Clinical - Prescription Issue >> Sep 30, 2023  4:06 PM [name] wrote: Reason for CRM:  Patient called in upset that she called in her medication refill for Oxycodone HCl 10 MG TABS last week & it has not been sent to pharmacy. Went ahead and sent a refill request but patient states she wants the nurse to give her a call today because she wants her medication to be sent & ready for pick up at 5PM. Advised pt that there is usually a 3 business days turnaround time & she stated," NO & it needs to be done today when she goes & pick her husband's medications up. Please give patient a call to further advise. CB #: [phone].

## 2023-09-30 NOTE — Telephone Encounter (Addendum)
 Call to Patient states called into and spoke with person who did not know her.  Informed patient that we now have a new answering service.  Patient was also advised to call in for her prescription refills to the Pharmacy so that they will come to the Clinics. Patient was also informed that a prescription has been sent to her pharmacy.Patient has an appointment scheduled for tomorrow.

## 2023-09-30 NOTE — Addendum Note (Signed)
 Addended by: Dickie La on: 09/30/2023 04:13 PM   Modules accepted: Orders

## 2023-09-30 NOTE — Telephone Encounter (Signed)
 NOT OUR PATIENT

## 2023-09-30 NOTE — Telephone Encounter (Signed)
 Copied from CRM [id number]. Topic: Clinical - Prescription Issue >> Sep 30, 2023  2:53 PM [name] wrote: Reason for CRM: Patient called in upset that she called in her medication refill for Oxycodone HCl 10 MG TABS last week & it has not been sent to pharmacy. Went ahead and sent a refill request but patient states she wants the nurse to give her a call today because she wants her medication to be sent & ready for pick up at 5PM. Advised pt that there is usually a 3 business days turnaround time & she stated," NO & it needs to be done today when she goes & pick her husband's medications up. Please give patient a call to further advise. CB #: [phone].

## 2023-10-01 ENCOUNTER — Encounter: Admitting: Orthopaedic Surgery

## 2023-10-01 ENCOUNTER — Other Ambulatory Visit (HOSPITAL_COMMUNITY): Payer: Self-pay

## 2023-10-01 ENCOUNTER — Other Ambulatory Visit: Payer: Self-pay

## 2023-10-01 ENCOUNTER — Ambulatory Visit: Admitting: Student

## 2023-10-01 VITALS — BP 142/79 | HR 77 | Temp 97.8°F | Ht 62.0 in | Wt 167.7 lb

## 2023-10-01 DIAGNOSIS — Z794 Long term (current) use of insulin: Secondary | ICD-10-CM

## 2023-10-01 DIAGNOSIS — R200 Anesthesia of skin: Secondary | ICD-10-CM | POA: Diagnosis present

## 2023-10-01 DIAGNOSIS — Z7985 Long-term (current) use of injectable non-insulin antidiabetic drugs: Secondary | ICD-10-CM | POA: Diagnosis not present

## 2023-10-01 DIAGNOSIS — M797 Fibromyalgia: Secondary | ICD-10-CM | POA: Diagnosis not present

## 2023-10-01 DIAGNOSIS — E1165 Type 2 diabetes mellitus with hyperglycemia: Secondary | ICD-10-CM

## 2023-10-01 DIAGNOSIS — M1990 Unspecified osteoarthritis, unspecified site: Secondary | ICD-10-CM

## 2023-10-01 DIAGNOSIS — E785 Hyperlipidemia, unspecified: Secondary | ICD-10-CM

## 2023-10-01 DIAGNOSIS — E119 Type 2 diabetes mellitus without complications: Secondary | ICD-10-CM

## 2023-10-01 DIAGNOSIS — G894 Chronic pain syndrome: Secondary | ICD-10-CM

## 2023-10-01 DIAGNOSIS — Z7984 Long term (current) use of oral hypoglycemic drugs: Secondary | ICD-10-CM

## 2023-10-01 DIAGNOSIS — M720 Palmar fascial fibromatosis [Dupuytren]: Secondary | ICD-10-CM

## 2023-10-01 DIAGNOSIS — E113393 Type 2 diabetes mellitus with moderate nonproliferative diabetic retinopathy without macular edema, bilateral: Secondary | ICD-10-CM

## 2023-10-01 DIAGNOSIS — Z79891 Long term (current) use of opiate analgesic: Secondary | ICD-10-CM

## 2023-10-01 DIAGNOSIS — R202 Paresthesia of skin: Secondary | ICD-10-CM

## 2023-10-01 DIAGNOSIS — R61 Generalized hyperhidrosis: Secondary | ICD-10-CM

## 2023-10-01 MED ORDER — CYCLOBENZAPRINE HCL 10 MG PO TABS
10.0000 mg | ORAL_TABLET | Freq: Three times a day (TID) | ORAL | 3 refills | Status: DC | PRN
Start: 2023-10-01 — End: 2024-05-01
  Filled 2023-10-01: qty 90, 30d supply, fill #0
  Filled 2023-11-25: qty 90, 30d supply, fill #1
  Filled 2024-02-17: qty 90, 30d supply, fill #0
  Filled 2024-03-09 – 2024-03-11 (×2): qty 90, 30d supply, fill #1

## 2023-10-01 MED ORDER — ACCU-CHEK GUIDE ME W/DEVICE KIT
1.0000 | PACK | Freq: Three times a day (TID) | 1 refills | Status: DC
  Filled 2023-10-01: qty 1, 1d supply, fill #0

## 2023-10-01 MED ORDER — GLYCOPYRROLATE 1 MG PO TABS
1.0000 mg | ORAL_TABLET | Freq: Every day | ORAL | 3 refills | Status: AC
  Filled 2023-10-01: qty 30, 30d supply, fill #0
  Filled 2023-12-30: qty 30, 30d supply, fill #1
  Filled 2024-02-13: qty 30, 30d supply, fill #2
  Filled 2024-02-17: qty 30, 30d supply, fill #0
  Filled 2024-05-01: qty 30, 30d supply, fill #1

## 2023-10-01 MED ORDER — ATORVASTATIN CALCIUM 20 MG PO TABS
20.0000 mg | ORAL_TABLET | Freq: Every day | ORAL | 3 refills | Status: AC
  Filled 2023-10-01 (×2): qty 90, 90d supply, fill #0
  Filled 2023-12-30: qty 90, 90d supply, fill #1
  Filled 2024-03-26: qty 90, 90d supply, fill #0
  Filled 2024-03-26: qty 90, 90d supply, fill #2
  Filled 2024-07-01: qty 90, 90d supply, fill #1

## 2023-10-01 MED ORDER — CELECOXIB 100 MG PO CAPS
100.0000 mg | ORAL_CAPSULE | Freq: Every day | ORAL | 3 refills | Status: DC | PRN
  Filled 2023-10-01: qty 30, 30d supply, fill #0
  Filled 2023-11-25: qty 30, 30d supply, fill #1
  Filled 2024-01-01: qty 30, 30d supply, fill #2
  Filled 2024-02-17: qty 30, 30d supply, fill #0

## 2023-10-01 MED ORDER — LIRAGLUTIDE 18 MG/3ML ~~LOC~~ SOPN
1.8000 mg | PEN_INJECTOR | Freq: Every day | SUBCUTANEOUS | 3 refills | Status: DC
  Filled 2023-10-01: qty 9, 30d supply, fill #0
  Filled 2023-11-25: qty 9, 30d supply, fill #1
  Filled 2023-12-30: qty 9, 30d supply, fill #2
  Filled 2024-02-19: qty 9, 30d supply, fill #3

## 2023-10-01 MED ORDER — ACCU-CHEK SOFTCLIX LANCETS MISC
1 refills | Status: AC
  Filled 2023-10-01: qty 100, 33d supply, fill #0

## 2023-10-01 MED ORDER — ACCU-CHEK GUIDE TEST VI STRP
ORAL_STRIP | 8 refills | Status: DC
  Filled 2023-10-01 (×2): qty 100, 33d supply, fill #0
  Filled 2023-11-25: qty 100, 33d supply, fill #1
  Filled 2023-12-30: qty 100, 33d supply, fill #2

## 2023-10-01 MED ORDER — EMPAGLIFLOZIN 25 MG PO TABS
25.0000 mg | ORAL_TABLET | Freq: Every day | ORAL | 3 refills | Status: DC
  Filled 2023-10-01: qty 90, 90d supply, fill #0
  Filled 2023-12-30: qty 90, 90d supply, fill #1
  Filled 2024-03-26: qty 90, 90d supply, fill #0
  Filled 2024-03-26: qty 90, 90d supply, fill #2

## 2023-10-01 MED ORDER — PEN NEEDLES 32G X 4 MM MISC
1.0000 | Freq: Every day | 0 refills | Status: DC
  Filled 2023-10-01: qty 100, 34d supply, fill #0
  Filled 2023-11-25: qty 100, 34d supply, fill #1
  Filled 2023-12-30: qty 100, 34d supply, fill #2
  Filled 2024-02-25: qty 100, 34d supply, fill #3

## 2023-10-01 MED ORDER — BASAGLAR KWIKPEN 100 UNIT/ML ~~LOC~~ SOPN
25.0000 [IU] | PEN_INJECTOR | Freq: Every day | SUBCUTANEOUS | 3 refills | Status: DC
  Filled 2023-10-01 – 2023-10-14 (×2): qty 15, 60d supply, fill #0

## 2023-10-01 MED ORDER — GABAPENTIN 400 MG PO CAPS
400.0000 mg | ORAL_CAPSULE | Freq: Every day | ORAL | 3 refills | Status: AC
  Filled 2023-10-01: qty 90, 90d supply, fill #0
  Filled 2023-12-30: qty 90, 90d supply, fill #1
  Filled 2024-02-13 – 2024-03-26 (×2): qty 90, 90d supply, fill #2
  Filled 2024-03-26: qty 90, 90d supply, fill #0
  Filled 2024-07-01: qty 90, 90d supply, fill #1

## 2023-10-01 NOTE — Telephone Encounter (Signed)
 Copied from CRM [id number]. Topic: Clinical - Prescription Issue >> Sep 30, 2023  2:53 PM [name] wrote: Reason for CRM: Patient called in upset that she called in her medication refill for Oxycodone HCl 10 MG TABS last week & it has not been sent to pharmacy. Went ahead and sent a refill request but patient states she wants the nurse to give her a call today because she wants her medication to be sent & ready for pick up at 5PM. Advised pt that there is usually a 3 business days turnaround time & she stated," NO & it needs to be done today when she goes & pick her husband's medications up. Please give patient a call to further advise. CB #: [phone].

## 2023-10-01 NOTE — Assessment & Plan Note (Signed)
 Pt is on an extensive pain regimen with oxycodone 10mg  Q6H PRN, Celebrex 100mg , voltaren gel, flexeril 10mg  PRN, Gabapentin 400mg  at bedtime. She says the pain is somewhat under control, and she would previously undergo "nerve firings" with a PMR doctor. I have placed referral to them, Dr. Joelene Millin at [facility].

## 2023-10-01 NOTE — Addendum Note (Signed)
 Addended byOlegario Messier on: 10/01/2023 04:59 PM   Modules accepted: Orders

## 2023-10-01 NOTE — Progress Notes (Signed)
 CC: F/U since obtaining insurance  HPI:  [name] is a 62 y.o. female living with a history stated below and presents today for follow up obtaining insurance. Please see problem based assessment and plan for additional details.  Past Medical History:  Diagnosis Date   Adhesive capsulitis of right shoulder    with underlying tendinopathy rotator cuff   Arthritis    Diabetes mellitus    oral tx   Fibromyalgia    GERD (gastroesophageal reflux disease)    History of post-sterilization tuboplasty 2000   Pain of left thumb 01/10/2022   Acute pain in the left thumb after fall on an inclined surface on 01/06/22. Pain getting worse and movement decreasing since the injury. Pain located more at base than distal thumb. Patient has been using ice and ibuprofen and verapamil cream. Home pain meds include oxycodone, celebrex 100-200 mg qd, voltaren gel,  verapamil cream.      Plantar fasciitis    Right   Shortness of breath    with exertion   Sleep apnea 5 plus yrs   study -pt could not sleep test inconclusive.    Tear of meniscus of left knee    x2   Tear of meniscus of right knee     Current Outpatient Medications on File Prior to Visit  Medication Sig Dispense Refill   cetirizine (ZYRTEC) 10 MG tablet TAKE 1 TABLET BY MOUTH EVERY DAY 30 tablet 11   diclofenac Sodium (VOLTAREN) 1 % GEL Apply 4 g topically 4 (four) times daily. 300 g 1   glucose blood (ACCU-CHEK GUIDE) test strip Check blood sugar 3 (three) times daily. 100 strip 9   naloxone (NARCAN) 0.4 MG/ML injection Inject 1 mL (0.4 mg total) into the muscle as needed (for opioid overdose or respiratory depression). 1 mL 0   oxybutynin (DITROPAN-XL) 10 MG 24 hr tablet Take 1 tablet (10 mg total) by mouth daily. 90 tablet 0   Oxycodone HCl 10 MG TABS Take 1 tablet (10 mg total) by mouth every 6 (six) hours as needed. 120 tablet 0   pantoprazole (PROTONIX) 40 MG tablet TAKE 1 TABLET BY MOUTH EVERY DAY 90 tablet 1    TRUEplus Lancets 28G MISC Check three times a day to check blood sugar. 300 each 1   No current facility-administered medications on file prior to visit.    Family History  Problem Relation Age of Onset   Breast cancer Sister        both sisters have breast cancer    Social History   Socioeconomic History   Marital status: Married    Spouse name: Not on file   Number of children: Not on file   Years of education: GED +1 yr   Highest education level: Not on file  Occupational History   Occupation: unemployed    Employer: UNEMPLOYED  Tobacco Use   Smoking status: Never   Smokeless tobacco: Never  Vaping Use   Vaping status: Never Used  Substance and Sexual Activity   Alcohol use: No    Alcohol/week: 0.0 standard drinks of alcohol   Drug use: No   Sexual activity: Not on file  Other Topics Concern   Not on file  Social History Narrative   Married, unemployed, Husband disabled and paraplegic 2/2 fall from tree stand while deer hunting, Son quadriplegic 2/2 MVA 05/2007 in [place]   Social Drivers of Health   Financial Resource Strain: Not on file  Food Insecurity: No Food Insecurity (  09/06/2022)   Hunger Vital Sign    Worried About Running Out of Food in the Last Year: Never true    Ran Out of Food in the Last Year: Never true  Transportation Needs: No Transportation Needs (09/06/2022)   PRAPARE - Administrator, Civil Service (Medical): No    Lack of Transportation (Non-Medical): No  Physical Activity: Not on file  Stress: Not on file  Social Connections: Unknown (09/06/2022)   Social Connection and Isolation Panel [NHANES]    Frequency of Communication with Friends and Family: More than three times a week    Frequency of Social Gatherings with Friends and Family: More than three times a week    Attends Religious Services: Not on Insurance claims handler of Clubs or Organizations: No    Attends Banker Meetings: Never    Marital Status: Married   Catering manager Violence: Not At Risk (09/06/2022)   Humiliation, Afraid, Rape, and Kick questionnaire    Fear of Current or Ex-Partner: No    Emotionally Abused: No    Physically Abused: No    Sexually Abused: No    Review of Systems: ROS negative except for what is noted on the assessment and plan.  Vitals:   10/01/23 1454  BP: (!) 142/79  Pulse: 77  Temp: 97.8 F (36.6 C)  TempSrc: Oral  SpO2: 99%  Weight: 167 lb 11.2 oz (76.1 kg)  Height: 5\' 2"  (1.575 m)    Physical Exam: Constitutional: well-appearing female  in no acute distress HENT: normocephalic atraumatic, mucous membranes moist Cardiovascular: regular rate and rhythm, no m/r/g Pulmonary/Chest: normal work of breathing on room air, lungs clear to auscultation bilaterally Neurological: alert & oriented x 3, 5/5 strength in bilateral upper and lower extremities, normal gait Psych: normal mood and affect MSK: Tenderness to palpation on bilateral knees  Assessment & Plan:   Numbness and tingling in left hand Referral made to hand surgeon for her hand pain with history of Dupuytren Contracture.   Long-term current use of opiate analgesic for osteoarthritis and fibromyalgia Pt is on an extensive pain regimen with oxycodone 10mg  Q6H PRN, Celebrex 100mg , voltaren gel, flexeril 10mg  PRN, Gabapentin 400mg  at bedtime. She says the pain is somewhat under control, and she would previously undergo "nerve firings" with a PMR doctor. I have placed referral to them, Dr. Joelene Millin at [facility].  DM2 (diabetes mellitus, type 2) (HCC) Last A1c 8.8, unfortunately did not check today. She has not been taking any of her anti-diabetic medication as she has not had access to them given previous financial restraints. Have refilled her Victoza 1.8mg , Jardiance 25mg , Insulin glargine 25U.   I have also refilled most of her medications (except oxycodone which was refilled today by separate provider) as she now has medicaid and her  medications should be covered now with multiple refills.    Patient discussed with Dr. Rockey Situ, M.D. [facility] Internal Medicine, PGY-2 Pager: [phone] Date 10/01/2023 Time 4:57 PM

## 2023-10-01 NOTE — Assessment & Plan Note (Signed)
 Referral made to hand surgeon for her hand pain with history of Dupuytren Contracture.

## 2023-10-01 NOTE — Patient Instructions (Signed)
 Thank you so much for coming to the clinic today!   I have refilled all of your medications and put more refills on them so you don't have to keep going back on forth. I have also placed the referrals we were discussing.   If you have any questions please feel free to the call the clinic at anytime at [phone]. It was a pleasure seeing you!  Best, Dr. Thomasene Ripple

## 2023-10-01 NOTE — Assessment & Plan Note (Signed)
 Last A1c 8.8, unfortunately did not check today. She has not been taking any of her anti-diabetic medication as she has not had access to them given previous financial restraints. Have refilled her Victoza 1.8mg , Jardiance 25mg , Insulin glargine 25U.

## 2023-10-01 NOTE — Telephone Encounter (Signed)
 I called [facility] who stated Oxycodone is not due for refill until tomorrow. I called pt - stated she has an appt today.

## 2023-10-02 ENCOUNTER — Other Ambulatory Visit: Payer: Self-pay

## 2023-10-02 ENCOUNTER — Telehealth: Payer: Self-pay

## 2023-10-02 ENCOUNTER — Other Ambulatory Visit (HOSPITAL_COMMUNITY): Payer: Self-pay

## 2023-10-02 NOTE — Telephone Encounter (Signed)
 Billie Ruddy ([employer]) PA Case ID #: [id number] Rx #: [id number] Need Help? Call us at [phone] Outcome Approved today by [facility] PA Case: [id number], Status: Approved, Coverage Starts on: 10/02/2023 12:00:00 AM, Coverage Ends on: 03/30/2024 12:00:00 AM. Effective Date: 10/02/2023 Authorization Expiration Date: 03/30/2024 Drug oxyCODONE HCl 10MG  tablets ePA cloud logo Form [facility] [place] [employer] [place] Form [phone] NCPDP) Original Claim Info 75 PLAN LIMITATION EXCEEDED 5 DAYS SUPPLY.CALL [phone] OR SUBMIT PA TO [email or web address] 3 DS O/R, USE PAMC [phone] MME > 50. CONSIDER DISPENSING NALOXONEDRUG REQUIRES PRIOR

## 2023-10-02 NOTE — Telephone Encounter (Signed)
 Prior Authorization for patient (oxyCODONE HCl 10MG  tablets) came through on cover my meds was submitted with last office notes awaiting approval or denial.  GMW:[id number]

## 2023-10-03 ENCOUNTER — Other Ambulatory Visit (HOSPITAL_COMMUNITY): Payer: Self-pay

## 2023-10-09 ENCOUNTER — Telehealth: Payer: Self-pay | Admitting: Podiatry

## 2023-10-09 ENCOUNTER — Other Ambulatory Visit: Payer: Self-pay

## 2023-10-09 NOTE — Telephone Encounter (Signed)
 DOS: 10/24/23  EXCISION PLANTAR FIBROMAS RIGHT FOOT   EFFECTIVE DATE: 07/17/2023   PER [employer]UE REP [place]. S NO PRIOR AUTH IS REQ FOR CPT CODE 40981  [phone]

## 2023-10-10 ENCOUNTER — Ambulatory Visit (INDEPENDENT_AMBULATORY_CARE_PROVIDER_SITE_OTHER): Admitting: Sports Medicine

## 2023-10-10 ENCOUNTER — Encounter: Payer: Self-pay | Admitting: Sports Medicine

## 2023-10-10 ENCOUNTER — Telehealth: Payer: Self-pay | Admitting: *Deleted

## 2023-10-10 DIAGNOSIS — M25552 Pain in left hip: Secondary | ICD-10-CM | POA: Diagnosis not present

## 2023-10-10 DIAGNOSIS — M25551 Pain in right hip: Secondary | ICD-10-CM | POA: Diagnosis not present

## 2023-10-10 DIAGNOSIS — M1612 Unilateral primary osteoarthritis, left hip: Secondary | ICD-10-CM

## 2023-10-10 DIAGNOSIS — R29898 Other symptoms and signs involving the musculoskeletal system: Secondary | ICD-10-CM

## 2023-10-10 MED ORDER — LIDOCAINE HCL 1 % IJ SOLN
2.0000 mL | INTRAMUSCULAR | Status: AC | PRN
  Administered 2023-10-10: 2 mL

## 2023-10-10 MED ORDER — BUPIVACAINE HCL 0.25 % IJ SOLN
2.0000 mL | INTRAMUSCULAR | Status: AC | PRN
  Administered 2023-10-10: 2 mL via INTRA_ARTICULAR

## 2023-10-10 MED ORDER — BETAMETHASONE SOD PHOS & ACET 6 (3-3) MG/ML IJ SUSP
6.0000 mg | INTRAMUSCULAR | Status: AC | PRN
  Administered 2023-10-10: 6 mg via INTRA_ARTICULAR

## 2023-10-10 NOTE — Progress Notes (Signed)
 [name] - 63 y.o. female MRN [id number]  Date of birth: [birth year]  Office Visit Note: Visit Date: 10/10/2023 PCP: Dickie La, MD Referred by: Dickie La, MD  Subjective: Chief Complaint  Patient presents with   Left Hip - Follow-up   HPI: [name] is a pleasant 63 y.o. female who presents today for follow-up of left hip pain.  Back on 09/26/23 we did perform an ultrasound-guided left hip injection after referral from Dr. Roda Shutters for hip arthritis.  She states she had essentially 100% pain relief of her anterior hip and groin pain.  She is still however having pain directly over the greater trochanter on the lateral side of the hip.  This hurts to lay on that side.  She is taking Celebrex which helps with most of her joint pain but her lateral hip pain persists.  She has not been doing physical therapy, is open to this, but does have an upcoming foot surgery at the beginning of April.  Pertinent ROS were reviewed with the patient and found to be negative unless otherwise specified above in HPI.   Assessment & Plan: Visit Diagnoses:  1. Unilateral primary osteoarthritis, left hip   2. Greater trochanteric pain syndrome of left lower extremity   3. Bilateral hip pain   4. Weakness of left hip    Plan: Impression is acute on chronic left hip pain with resolution of her intra-articular pain status post previous injection.  Her exam today suggest pain over the greater trochanteric region with some weakness with hip abduction.  Through shared decision-making, did proceed with a trochanteric injection for pain relief.  Following 48 to 72 hours of mild/activity, I would like her to get started in physical therapy to help strengthen the hips to keep them symmetric and balanced.  She prefers home therapy versus formal PT given her upcoming surgery we did print out a customized handout for hip stabilization and strengthening.  My athletic trainer, Isabelle Course, did review these with her in  the room today.  She will start these once daily.  She may use her Celebrex as needed.  Also ice/heat for any postinjection pain.  At the end of the visit she did mention her shoulder pain, she will make a new appointment for evaluation at her leisure.   Follow-up: Return for make new appt for shoulder eval at her leisure.   Meds & Orders: No orders of the defined types were placed in this encounter.   Orders Placed This Encounter  Procedures   Large Joint Inj: R greater trochanter     Procedures: Large Joint Inj: L greater trochanter on 10/10/2023 10:34 AM Indications: pain Details: 25 G 1.5 in needle, lateral approach Medications: 2 mL lidocaine 1 %; 2 mL bupivacaine 0.25 %; 6 mg betamethasone acetate-betamethasone sodium phosphate 6 (3-3) MG/ML Outcome: tolerated well, no immediate complications  Greater Trochanteric Bursa Injection, Left After discussion on risk/benefits/indications, an informed verbal consent was obtained and a timeout was performed. The patient was lying in lateral recumbent position on exam table. The area overlying the trochanteric bursa was prepped with Betadine and alcohol swab then injected with 2:2:1 lidocaine:bupivicaine:celestone. Patient tolerated procedure well without immediate complications.  Procedure, treatment alternatives, risks and benefits explained, specific risks discussed. Consent was given by the patient. Immediately prior to procedure a time out was called to verify the correct patient, procedure, equipment, support staff and site/side marked as required. Patient was prepped and draped in the usual sterile fashion.  Clinical History: No specialty comments available.  She reports that she has never smoked. She has never used smokeless tobacco.  Recent Labs    03/12/23 1054 06/18/23 1120  HGBA1C 8.3* 8.8*    Objective:   Vital Signs: LMP 07/04/2017   Physical Exam  Gen: Well-appearing, in no acute distress; non-toxic CV:  Well-perfused. Warm.  Resp: Breathing unlabored on room air; no wheezing. Psych: Fluid speech in conversation; appropriate affect; normal thought process  Ortho Exam - Left hip: + TTP over the greater trochanter, very mild soft tissue swelling joint.  There is no pain in the groin with internal or sternal logroll.  There is 4/5 strength with resisted hip abduction compared to 5/5 strength of the contralateral hip.  Hip abduction does recreate her pain as well.  Imaging:  09/12/23: X-rays of the hips showed mild to moderate osteoarthritis with joint space  narrowing.  Slight irregularity to the trochanteric region.   Past Medical/Family/Surgical/Social History: Medications & Allergies reviewed per EMR, new medications updated. 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/2017   Restless legs syndrome (RLS) 10/24/2016   Long-term current use of opiate analgesic for osteoarthritis and fibromyalgia 03/01/2016   Dizziness 06/02/2012   Fibromyalgia 05/23/2012   Healthcare maintenance 03/01/2011   Hyperlipidemia 07/27/2010   DM2 (diabetes mellitus, type 2) (HCC) 07/26/2010   Past Medical History:  Diagnosis Date   Adhesive capsulitis of right shoulder    with underlying tendinopathy rotator cuff   Arthritis    Diabetes mellitus    oral tx   Fibromyalgia    GERD (gastroesophageal reflux disease)    History of post-sterilization tuboplasty 2000   Pain of left thumb 01/10/2022   Acute pain in the left thumb after fall on an inclined surface on 01/06/22.  Pain getting worse and movement decreasing since the injury. Pain located more at base than distal thumb. Patient has been using ice and ibuprofen and verapamil cream. Home pain meds include oxycodone, celebrex 100-200 mg qd, voltaren gel,  verapamil cream.      Plantar fasciitis    Right   Shortness of breath    with exertion   Sleep apnea 5 plus yrs   study -pt could not sleep test inconclusive.    Tear of meniscus of left knee    x2   Tear of meniscus of right knee    Family History  Problem Relation Age of Onset   Breast cancer Sister        both sisters have breast cancer   Past Surgical History:  Procedure Laterality Date   CARPAL TUNNEL RELEASE Bilateral    CHOLECYSTECTOMY     FOOT TENDON SURGERY     KNEE ARTHROSCOPY  10/10/2011   Procedure: ARTHROSCOPY KNEE;  Surgeon: Nadara Mustard, MD;  Location: [facility] OR;  Service: Orthopedics;  Laterality: Right;  Right Knee Arthroscopy and Excision Medial Meniscus   MENISCUS REPAIR     R and L (L x2)   ROTATOR CUFF REPAIR Left    TONSILLECTOMY     TOTAL KNEE  ARTHROPLASTY Left 06/06/2020   Procedure: LEFT TOTAL KNEE ARTHROPLASTY;  Surgeon: Tarry Kos, MD;  Location: [facility] OR;  Service: Orthopedics;  Laterality: Left;   TUBAL LIGATION     Social History   Occupational History   Occupation: unemployed    Associate Professor: UNEMPLOYED  Tobacco Use   Smoking status: Never   Smokeless tobacco: Never  Vaping Use   Vaping status: Never Used  Substance and Sexual Activity   Alcohol use: No    Alcohol/week: 0.0 standard drinks of alcohol   Drug use: No   Sexual activity: Not on file

## 2023-10-10 NOTE — Progress Notes (Signed)
 Patient says that she did not get 100% relief from the hip joint injection, but it has helped with the pain she was having through the front of the hip. She is still having constant lateral hip pain that is worse with walking and when she lays on that side. She takes Celebrex and says that helps with most of her joint pain, but has not helped with her lateral hip pain.  Patient was instructed in 10 minutes of therapeutic exercises for left hip to improve strength, ROM and function according to my instructions and plan of care by a Certified Athletic Trainer during the office visit. A customized handout was provided and demonstration of proper technique shown and discussed. Patient did perform exercises and demonstrate understanding through teachback.  All questions discussed and answered.

## 2023-10-10 NOTE — Telephone Encounter (Signed)
 Mammogram appointment- April 29.2025 @ 3:30 pm arrive 3:15 pm breast center. Spoke with patient she is aware of  this appointment and the 75.00 no show fee and to call 24 hour prior to canceling -rescheduling appointment. Appointment mailed to patient.

## 2023-10-11 NOTE — Addendum Note (Signed)
 Addended by: Gust Rung on: 10/11/2023 09:02 PM   Modules accepted: Level of Service

## 2023-10-11 NOTE — Progress Notes (Signed)
 Internal Medicine Clinic Attending  Case discussed with the resident at the time of the visit.  We reviewed the resident's history and exam and pertinent patient test results.  I agree with the assessment, diagnosis, and plan of care documented in the resident's note.

## 2023-10-14 ENCOUNTER — Telehealth: Payer: Self-pay | Admitting: *Deleted

## 2023-10-14 ENCOUNTER — Telehealth: Payer: Self-pay | Admitting: Internal Medicine

## 2023-10-14 ENCOUNTER — Other Ambulatory Visit (HOSPITAL_COMMUNITY): Payer: Self-pay

## 2023-10-14 DIAGNOSIS — H9193 Unspecified hearing loss, bilateral: Secondary | ICD-10-CM

## 2023-10-14 DIAGNOSIS — Z794 Long term (current) use of insulin: Secondary | ICD-10-CM

## 2023-10-14 MED ORDER — INSULIN GLARGINE 100 UNIT/ML SOLOSTAR PEN
25.0000 [IU] | PEN_INJECTOR | Freq: Every day | SUBCUTANEOUS | 4 refills | Status: DC
Start: 2023-10-14 — End: 2024-02-18
  Filled 2023-10-14: qty 15, 60d supply, fill #0
  Filled 2023-11-25 – 2023-11-28 (×2): qty 15, 60d supply, fill #1
  Filled 2024-02-13: qty 15, 60d supply, fill #2

## 2023-10-14 NOTE — Telephone Encounter (Signed)
 Patient was referred to [facility] in July 2024, could not complete the referral due to insurance issues. I can send a new referral.

## 2023-10-14 NOTE — Telephone Encounter (Signed)
 Please let the patient know that insulin refill was sent to the [facility] on 10/01/23 and should be available. Thank you.

## 2023-10-14 NOTE — Telephone Encounter (Signed)
 Copied from CRM [id number]. Topic: Referral - Request for Referral >> Oct 14, 2023  1:32 PM Carrielelia G wrote: Patient called and is requesting a referral for hearing aids, she states she had a hearing exam last year and failed it, but do to her not having insurance she could not afford the hearing aids. Patient stated she is not coming back in for and appointment. Please advise    Per message above the pt states she is in need of a New referral.  Can this Referral Request be done without an appt?  Pt's last appt was 10/01/2023 and no ENT referrals were mentioned.  Last appt sch with you was in 06/2023 and no referral request was made as well for an ENT at the time in reference to Hearing Aids being needed.Marland Kitchen

## 2023-10-14 NOTE — Telephone Encounter (Signed)
 Patient walked into Clinics today stating that her sugars have been in the 250-280.  Would like to get prescription Outpatient  for Lantus.  Would like to get the Lantus prescription sent to the [facility] or [facility] on [address].

## 2023-10-14 NOTE — Telephone Encounter (Signed)
 I have discontinued Basaglar and sent Lantus to [facility]. Thank you.

## 2023-10-14 NOTE — Telephone Encounter (Signed)
 Call to pharmacy patient had not picked.  Call to patient states that the Basaglar gives her stomach upset /Diarrhea and would prefer the Lantus instead.

## 2023-10-15 ENCOUNTER — Telehealth: Payer: Self-pay | Admitting: Podiatry

## 2023-10-15 ENCOUNTER — Other Ambulatory Visit (HOSPITAL_COMMUNITY): Payer: Self-pay

## 2023-10-15 NOTE — Telephone Encounter (Signed)
 Pt called and left message that she needed to r/s her surgery.  I returned call and pt had a family emergency and is going [place] of the [place]. So we have r/s to 01/30/24  She asked how long will she be off her feet for the surgery? Please advise

## 2023-10-16 NOTE — Telephone Encounter (Signed)
 Called [name] and gave her information from Dr Logan Bores about how long to stay off foot after surgery,.

## 2023-10-21 ENCOUNTER — Ambulatory Visit: Admitting: Sports Medicine

## 2023-10-24 ENCOUNTER — Other Ambulatory Visit: Payer: Self-pay

## 2023-10-28 ENCOUNTER — Ambulatory Visit: Admitting: Sports Medicine

## 2023-10-30 ENCOUNTER — Encounter: Payer: Self-pay | Admitting: Podiatry

## 2023-11-03 ENCOUNTER — Emergency Department (HOSPITAL_COMMUNITY)
Admission: EM | Admit: 2023-11-03 | Discharge: 2023-11-04 | Disposition: A | Discharge status: Home / Self Care | Attending: Emergency Medicine | Admitting: Emergency Medicine

## 2023-11-03 ENCOUNTER — Encounter (HOSPITAL_COMMUNITY): Payer: Self-pay | Admitting: *Deleted

## 2023-11-03 ENCOUNTER — Other Ambulatory Visit: Payer: Self-pay

## 2023-11-03 DIAGNOSIS — Z794 Long term (current) use of insulin: Secondary | ICD-10-CM | POA: Insufficient documentation

## 2023-11-03 DIAGNOSIS — G8929 Other chronic pain: Secondary | ICD-10-CM | POA: Insufficient documentation

## 2023-11-03 DIAGNOSIS — R519 Headache, unspecified: Secondary | ICD-10-CM | POA: Diagnosis not present

## 2023-11-03 DIAGNOSIS — E119 Type 2 diabetes mellitus without complications: Secondary | ICD-10-CM | POA: Diagnosis not present

## 2023-11-03 DIAGNOSIS — R42 Dizziness and giddiness: Secondary | ICD-10-CM | POA: Insufficient documentation

## 2023-11-03 LAB — CBC WITH DIFFERENTIAL/PLATELET
Abs Immature Granulocytes: 0.01 10*3/uL (ref 0.00–0.07)
Basophils Absolute: 0 10*3/uL (ref 0.0–0.1)
Basophils Relative: 0 %
Eosinophils Absolute: 0.2 10*3/uL (ref 0.0–0.5)
Eosinophils Relative: 2 %
HCT: 46.2 % — ABNORMAL HIGH (ref 36.0–46.0)
Hemoglobin: 15.3 g/dL — ABNORMAL HIGH (ref 12.0–15.0)
Immature Granulocytes: 0 %
Lymphocytes Relative: 46 %
Lymphs Abs: 3.7 10*3/uL (ref 0.7–4.0)
MCH: 29.6 pg (ref 26.0–34.0)
MCHC: 33.1 g/dL (ref 30.0–36.0)
MCV: 89.4 fL (ref 80.0–100.0)
Monocytes Absolute: 0.4 10*3/uL (ref 0.1–1.0)
Monocytes Relative: 5 %
Neutro Abs: 3.7 10*3/uL (ref 1.7–7.7)
Neutrophils Relative %: 47 %
Platelets: 299 10*3/uL (ref 150–400)
RBC: 5.17 MIL/uL — ABNORMAL HIGH (ref 3.87–5.11)
RDW: 12.4 % (ref 11.5–15.5)
WBC: 8 10*3/uL (ref 4.0–10.5)
nRBC: 0 % (ref 0.0–0.2)

## 2023-11-03 LAB — COMPREHENSIVE METABOLIC PANEL WITH GFR
ALT: 20 U/L (ref 0–44)
AST: 22 U/L (ref 15–41)
Albumin: 3.7 g/dL (ref 3.5–5.0)
Alkaline Phosphatase: 97 U/L (ref 38–126)
Anion gap: 8 (ref 5–15)
BUN: 9 mg/dL (ref 8–23)
CO2: 25 mmol/L (ref 22–32)
Calcium: 9.5 mg/dL (ref 8.9–10.3)
Chloride: 102 mmol/L (ref 98–111)
Creatinine, Ser: 0.66 mg/dL (ref 0.44–1.00)
GFR, Estimated: >60 mL/min (ref >60)
Glucose, Bld: 347 mg/dL — ABNORMAL HIGH (ref 70–99)
Potassium: 3.9 mmol/L (ref 3.5–5.1)
Sodium: 135 mmol/L (ref 135–145)
Total Bilirubin: 0.6 mg/dL (ref 0.0–1.2)
Total Protein: 7.2 g/dL (ref 6.5–8.1)

## 2023-11-03 LAB — TROPONIN I (HIGH SENSITIVITY)
Troponin I (High Sensitivity): 3 ng/L (ref <18)
Troponin I (High Sensitivity): 3 ng/L (ref <18)

## 2023-11-03 NOTE — ED Provider Triage Note (Addendum)
 Emergency Medicine Provider Triage Evaluation Note  [name] , a 63 y.o. female  was evaluated in triage.  Pt complains of dizziness.  Patient reports that she has noted specifically with positional changes of her head when lying down to standing up or sitting, she will notice some room spinning type sensation.  Denies any ringing in her ears.  She states that she recently had a retinal issue causing vision loss in her right eye that she is being worked on with her ophthalmologist.  No other new or recent diagnoses that she is aware of or any new medications.  She has endorsed some left shoulder discomfort on occasions with radiation from the left shoulder towards her left chest.  No history of any cardiac abnormalities.  Review of Systems  Positive: As above Negative: As above  Physical Exam  BP (!) 156/80 (BP Location: Left Arm)   Pulse 81   Temp 97.9 F (36.6 C)   Resp 15   Ht 5\' 2"  (1.575 m)   Wt 76.1 kg   LMP 07/04/2017   SpO2 99%   BMI 30.69 kg/m  Gen:   Awake, no distress   Resp:  Normal effort  MSK:   Moves extremities without difficulty  Other:  No appreciable nystagmus, PERRL.  Medical Decision Making  Medically screening exam initiated at 8:28 PM.  Appropriate orders placed.  Dewey Neukam was informed that the remainder of the evaluation will be completed by another provider, this initial triage assessment does not replace that evaluation, and the importance of remaining in the ED until their evaluation is complete.     Sedrick Tober A, PA-C 11/03/23 2029    Onesti Bonfiglio A, PA-C 11/03/23 2029

## 2023-11-03 NOTE — ED Triage Notes (Signed)
 Dizziness for 4 days  no nausea or vomiting  lt shoulder pain for 2 weeks

## 2023-11-04 ENCOUNTER — Other Ambulatory Visit: Payer: Self-pay | Admitting: *Deleted

## 2023-11-04 ENCOUNTER — Emergency Department (HOSPITAL_COMMUNITY)

## 2023-11-04 ENCOUNTER — Other Ambulatory Visit (HOSPITAL_COMMUNITY): Payer: Self-pay

## 2023-11-04 ENCOUNTER — Encounter: Admitting: Orthopedic Surgery

## 2023-11-04 DIAGNOSIS — G894 Chronic pain syndrome: Secondary | ICD-10-CM

## 2023-11-04 MED ORDER — MECLIZINE HCL 25 MG PO TABS
25.0000 mg | ORAL_TABLET | Freq: Once | ORAL | Status: AC
  Administered 2023-11-04: 25 mg via ORAL
  Filled 2023-11-04: qty 1

## 2023-11-04 MED ORDER — OXYCODONE HCL 10 MG PO TABS
10.0000 mg | ORAL_TABLET | Freq: Four times a day (QID) | ORAL | 0 refills | Status: DC | PRN
Start: 2023-11-04 — End: 2023-11-29
  Filled 2023-11-04: qty 120, 30d supply, fill #0

## 2023-11-04 MED ORDER — MECLIZINE HCL 25 MG PO TABS
25.0000 mg | ORAL_TABLET | Freq: Three times a day (TID) | ORAL | 0 refills | Status: DC | PRN
  Filled 2023-11-04: qty 90, 30d supply, fill #0

## 2023-11-04 NOTE — ED Notes (Signed)
 Patient transported to MRI

## 2023-11-04 NOTE — ED Provider Notes (Signed)
 [place] EMERGENCY DEPARTMENT AT [facility] Provider Note   CSN: [id number] Arrival date & time: 11/03/23  6578     History  Chief Complaint  Patient presents with   Dizziness    [name] is a 63 y.o. female.  Patient with past medical history significant for type II DM, fibromyalgia, long-term use of opiate analgesic for osteoarthritis and fibromyalgia, restless leg syndrome, dizziness presents the emergency department complaining of increased dizziness over the past 4 days.  She denies associated nausea or vomiting.  She states that positioning does not seem to make a large difference in the dizziness.  She feels the room is spinning incessantly.  She denies chest pain, abdominal pain, shortness of breath, fevers, visual symptoms.  She does endorse a mild headache.   Dizziness      Home Medications Prior to Admission medications   Medication Sig Start Date End Date Taking? Authorizing Provider  atorvastatin  (LIPITOR) 20 MG tablet Take 1 tablet (20 mg total) by mouth daily. 10/01/23   Nooruddin, Saad, MD  Blood Glucose Monitoring Suppl (ACCU-CHEK GUIDE ME) w/Device KIT Use to check blood sugar 3 times a day. 10/01/23   Nooruddin, Saad, MD  celecoxib  (CELEBREX ) 100 MG capsule Take 1 capsule (100 mg total) by mouth daily as needed for moderate pain (pain score 4-6). 10/01/23 09/30/24  Nooruddin, Saad, MD  cetirizine  (ZYRTEC ) 10 MG tablet TAKE 1 TABLET BY MOUTH EVERY DAY 10/22/22   Bevelyn Bryant, MD  cyclobenzaprine  (FLEXERIL ) 10 MG tablet Take 1 tablet (10 mg total) by mouth 3 (three) times daily as needed for muscle spasms. 10/01/23   Nooruddin, Saad, MD  diclofenac  Sodium (VOLTAREN ) 1 % GEL Apply 4 g topically 4 (four) times daily. 06/18/23   Bevelyn Bryant, MD  empagliflozin  (JARDIANCE ) 25 MG TABS tablet Take 1 tablet (25 mg total) by mouth daily. 10/01/23   Nooruddin, Saad, MD  gabapentin  (NEURONTIN ) 400 MG capsule Take 1 capsule (400 mg total) by mouth at bedtime. 10/01/23    Nooruddin, Saad, MD  glucose blood (ACCU-CHEK GUIDE) test strip Check blood sugar 3 (three) times daily. 03/12/23   Bevelyn Bryant, MD  glucose blood (ACCU-CHEK GUIDE TEST) test strip Use to check blood sugar levles 3 (three) times daily. 10/01/23   Nooruddin, Saad, MD  glycopyrrolate  (ROBINUL ) 1 MG tablet Take 1 tablet (1 mg total) by mouth at bedtime. 10/01/23   Nooruddin, Saad, MD  insulin  glargine (LANTUS ) 100 UNIT/ML Solostar Pen Inject 25 Units into the skin daily. 10/14/23   Bevelyn Bryant, MD  Insulin  Pen Needle (PEN NEEDLES) 32G X 4 MM MISC Use with Victoza  and Lantus  10/01/23   Nooruddin, Saad, MD  liraglutide  (VICTOZA ) 18 MG/3ML SOPN Inject 1.8 mg into the skin daily. 10/01/23   Nooruddin, Saad, MD  naloxone  (NARCAN ) 0.4 MG/ML injection Inject 1 mL (0.4 mg total) into the muscle as needed (for opioid overdose or respiratory depression). 03/12/23   Bevelyn Bryant, MD  oxybutynin  (DITROPAN -XL) 10 MG 24 hr tablet Take 1 tablet (10 mg total) by mouth daily. 03/12/23   Bevelyn Bryant, MD  Oxycodone  HCl 10 MG TABS Take 1 tablet (10 mg total) by mouth every 6 (six) hours as needed. 09/30/23   Bevelyn Bryant, MD  pantoprazole  (PROTONIX ) 40 MG tablet TAKE 1 TABLET BY MOUTH EVERY DAY 10/22/22   Bevelyn Bryant, MD  Accu-Chek Softclix Lancets lancets Use as directed to check blood sugar levels three times a day. 10/01/23   Nooruddin, Saad, MD  Allergies    Byetta  10 mcg pen [exenatide ], Metformin , Mango butter, and Ofloxacin    Review of Systems   Review of Systems  Neurological:  Positive for dizziness.    Physical Exam Updated Vital Signs BP (!) 151/57   Pulse 60   Temp 98.6 F (37 C)   Resp 16   Ht 5\' 2"  (1.575 m)   Wt 76.1 kg   LMP 07/04/2017   SpO2 100%   BMI 30.69 kg/m  Physical Exam Vitals and nursing note reviewed.  Constitutional:      General: She is not in acute distress.    Appearance: She is well-developed.  HENT:     Head: Normocephalic and atraumatic.  Eyes:     Extraocular Movements:  Extraocular movements intact.     Conjunctiva/sclera: Conjunctivae normal.     Pupils: Pupils are equal, round, and reactive to light.  Cardiovascular:     Rate and Rhythm: Normal rate and regular rhythm.  Pulmonary:     Effort: Pulmonary effort is normal. No respiratory distress.     Breath sounds: Normal breath sounds.  Abdominal:     Palpations: Abdomen is soft.     Tenderness: There is no abdominal tenderness.  Musculoskeletal:        General: No swelling.     Cervical back: Neck supple.  Skin:    General: Skin is warm and dry.     Capillary Refill: Capillary refill takes less than 2 seconds.  Neurological:     General: No focal deficit present.     Mental Status: She is alert.     Comments: Gait not assessed due to patient's dizziness  Psychiatric:        Mood and Affect: Mood normal.     ED Results / Procedures / Treatments   Labs (all labs ordered are listed, but only abnormal results are displayed) Labs Reviewed  CBC WITH DIFFERENTIAL/PLATELET - Abnormal; Notable for the following components:      Result Value   RBC 5.17 (*)    Hemoglobin 15.3 (*)    HCT 46.2 (*)    All other components within normal limits  COMPREHENSIVE METABOLIC PANEL WITH GFR - Abnormal; Notable for the following components:   Glucose, Bld 347 (*)    All other components within normal limits  TROPONIN I (HIGH SENSITIVITY)  TROPONIN I (HIGH SENSITIVITY)    EKG None  Radiology No results found.  Procedures Procedures    Medications Ordered in ED Medications  meclizine  (ANTIVERT ) tablet 25 mg (25 mg Oral Given 11/04/23 0505)    ED Course/ Medical Decision Making/ A&P                                 Medical Decision Making Amount and/or Complexity of Data Reviewed Radiology: ordered.   This patient presents to the ED for concern of dizziness, this involves an extensive number of treatment options, and is a complaint that carries with it a high risk of complications and  morbidity.  The differential diagnosis includes peripheral vertigo, central vertigo, presyncope, others   Co morbidities that complicate the patient evaluation  History of dizziness, type II DM, chronic pain on narcotic pain control   Additional history obtained:   External records from outside source obtained and reviewed including internal medicine notes   Lab Tests:  I Ordered, and personally interpreted labs.  The pertinent results include: Grossly unremarkable CMP,  CBC.  Troponin of 3   Imaging Studies ordered:  I ordered imaging studies including MR brain without contrast Imaging is pending   Cardiac Monitoring: / EKG:  The patient was maintained on a cardiac monitor.  I personally viewed and interpreted the cardiac monitored which showed an underlying rhythm of: Normal sinus rhythm   Problem List / ED Course / Critical interventions / Medication management   I ordered medication including meclizine  for dizziness Reevaluation of the patient after these medicines showed that the patient stayed the same I have reviewed the patients home medicines and have made adjustments as needed   Social Determinants of Health:  Patient has Medicaid for her primary health insurance   Test / Admission - Considered:  Patient care being transferred to Sonnie Dusky, PA-C at shift handoff.  Disposition pending MRI results and reassessment.        Final Clinical Impression(s) / ED Diagnoses Final diagnoses:  Dizziness    Rx / DC Orders ED Discharge Orders     None         Delories Fetter 11/04/23 3875    Rosealee Concha, MD 11/04/23 [phone]

## 2023-11-04 NOTE — Discharge Instructions (Addendum)
 As discussed, your imaging was reassuring.  No signs of intracranial normality causing the dizziness.  Take meclizine  3 times a day as needed and try performing the Epley maneuver at home to help with your dizziness.  I have sent ambulatory referral for neurology.  They should call you in the next several days to schedule an appointment for further evaluation of your dizziness.  Get help right away if: You're always dizzy or you faint. You have a stiff neck. You have trouble moving or speaking. Your hands, arms, or legs feel weak. Your hearing or eyesight changes.

## 2023-11-04 NOTE — ED Provider Notes (Signed)
 Patient care taken over at shift change. Disposition pending MRI results. Physical Exam  BP (!) 151/57   Pulse 60   Temp 98.6 F (37 C)   Resp 16   Ht 5\' 2"  (1.575 m)   Wt 76.1 kg   LMP 07/04/2017   SpO2 100%   BMI 30.69 kg/m   Physical Exam  Procedures  Procedures  ED Course / MDM    Medical Decision Making Amount and/or Complexity of Data Reviewed Radiology: ordered.   If MRI and patient can ambulate, she'll be safe for discharge home with neurology follow up.  MRI shows no acute intracranial abnormality.  MRI brain largely normal for age.  Patient endorses history of dizziness.  States symptoms returned about 4 days ago and have persisted since.  Dizziness is not worse with positional changes or with head movement.  Was given seen this morning with some improvement of the dizziness.  She is able to ambulate independently with no gait abnormality.  Discussed findings with patient.  All questions were answered.  Prescription for meclizine  sent to the pharmacy.  Referral for neurology sent for patient to follow-up with further evaluation.  Return precautions given.   Sonnie Dusky, PA-C 11/04/23 0835    Teddi Favors, DO 11/05/23 [phone]

## 2023-11-04 NOTE — Telephone Encounter (Signed)
 Last rx written - 09/30/23. Next OV - 11/05/23. TOX -10/31/22.

## 2023-11-05 ENCOUNTER — Ambulatory Visit: Admitting: Internal Medicine

## 2023-11-05 ENCOUNTER — Encounter: Admitting: Internal Medicine

## 2023-11-05 VITALS — BP 137/74 | HR 84 | Temp 98.3°F | Ht 62.0 in | Wt 166.1 lb

## 2023-11-05 DIAGNOSIS — R42 Dizziness and giddiness: Secondary | ICD-10-CM

## 2023-11-05 DIAGNOSIS — Z7985 Long-term (current) use of injectable non-insulin antidiabetic drugs: Secondary | ICD-10-CM | POA: Diagnosis not present

## 2023-11-05 DIAGNOSIS — Z79891 Long term (current) use of opiate analgesic: Secondary | ICD-10-CM | POA: Diagnosis not present

## 2023-11-05 DIAGNOSIS — E113399 Type 2 diabetes mellitus with moderate nonproliferative diabetic retinopathy without macular edema, unspecified eye: Secondary | ICD-10-CM | POA: Diagnosis present

## 2023-11-05 DIAGNOSIS — Z794 Long term (current) use of insulin: Secondary | ICD-10-CM | POA: Diagnosis not present

## 2023-11-05 DIAGNOSIS — E785 Hyperlipidemia, unspecified: Secondary | ICD-10-CM | POA: Diagnosis not present

## 2023-11-05 NOTE — Progress Notes (Signed)
 Subjective:  CC: dizziness  HPI:  [name] is a 63 y.o. female with a past medical history of diabetes, fibromyalgia who presents today for dizziness.  She has history of dizziness with prior evaluation concerning for peripheral vertigo versus orthostatic hypotension. She was referred to vestibular rehab in the past but unfortunately had a lapse in her insurance and was not able to be evaluated.  This episode of dizziness started last Thursday. Since then she has had up to 20 episodes a day. Each one she feels unsteady on her feet and like the room is spinning. She did not feel safe to drive over the weekend due to symptoms. Her daughter was concerned and drove her to the ED on Sunday.  MRI brain was completed that showed no signs of ischemic damage to explain dizziness. She was referred to neurology by ED provider.  Please see problem based assessment and plan for additional details.  Past Medical History:  Diagnosis Date   Adhesive capsulitis of right shoulder    with underlying tendinopathy rotator cuff   Arthritis    Diabetes mellitus    oral tx   Fibromyalgia    GERD (gastroesophageal reflux disease)    History of post-sterilization tuboplasty 2000   Pain of left thumb 01/10/2022   Acute pain in the left thumb after fall on an inclined surface on 01/06/22. Pain getting worse and movement decreasing since the injury. Pain located more at base than distal thumb. Patient has been using ice and ibuprofen  and verapamil cream. Home pain meds include oxycodone , celebrex  100-200 mg qd, voltaren  gel,  verapamil cream.      Plantar fasciitis    Right   Shortness of breath    with exertion   Sleep apnea 5 plus yrs   study -pt could not sleep test inconclusive.    Tear of meniscus of left knee    x2   Tear of meniscus of right knee     MEDICATIONS:  Pantoprazole  40 mg Naloxone  0.4 mg injection Robinul  1 mg daily Liraglutide  1.8 mg daily Zyrtec  10 mg Oxybutynin  10  mg Atorvastatin  20 mg Meclizine  25 mg tid prn Jardiance  25 mg Lantus  25 unites Gabapentin  400 mg qhs  Family History  Problem Relation Age of Onset   Breast cancer Sister        both sisters have breast cancer    Past Surgical History:  Procedure Laterality Date   CARPAL TUNNEL RELEASE Bilateral    CHOLECYSTECTOMY     FOOT TENDON SURGERY     KNEE ARTHROSCOPY  10/10/2011   Procedure: ARTHROSCOPY KNEE;  Surgeon: Timothy Ford, MD;  Location: [facility] OR;  Service: Orthopedics;  Laterality: Right;  Right Knee Arthroscopy and Excision Medial Meniscus   MENISCUS REPAIR     R and L (L x2)   ROTATOR CUFF REPAIR Left    TONSILLECTOMY     TOTAL KNEE ARTHROPLASTY Left 06/06/2020   Procedure: LEFT TOTAL KNEE ARTHROPLASTY;  Surgeon: Wes Hamman, MD;  Location: [facility] OR;  Service: Orthopedics;  Laterality: Left;   TUBAL LIGATION       Social History   Socioeconomic History   Marital status: Married    Spouse name: Not on file   Number of children: Not on file   Years of education: GED +1 yr   Highest education level: Not on file  Occupational History   Occupation: unemployed    Employer: UNEMPLOYED  Tobacco Use   Smoking status: Never  Smokeless tobacco: Never  Vaping Use   Vaping status: Never Used  Substance and Sexual Activity   Alcohol use: No    Alcohol/week: 0.0 standard drinks of alcohol   Drug use: No   Sexual activity: Not on file  Other Topics Concern   Not on file  Social History Narrative   Married, unemployed, Husband disabled and paraplegic 2/2 fall from tree stand while deer hunting, Son quadriplegic 2/2 MVA 05/2007 in [place]   Social Drivers of Health   Financial Resource Strain: Not on file  Food Insecurity: No Food Insecurity (09/06/2022)   Hunger Vital Sign    Worried About Running Out of Food in the Last Year: Never true    Ran Out of Food in the Last Year: Never true  Transportation Needs: No Transportation Needs (09/06/2022)   PRAPARE - Therapist, art (Medical): No    Lack of Transportation (Non-Medical): No  Physical Activity: Not on file  Stress: Not on file  Social Connections: Unknown (09/06/2022)   Social Connection and Isolation Panel [NHANES]    Frequency of Communication with Friends and Family: More than three times a week    Frequency of Social Gatherings with Friends and Family: More than three times a week    Attends Religious Services: Not on Insurance claims handler of Clubs or Organizations: No    Attends Banker Meetings: Never    Marital Status: Married  Catering manager Violence: Not At Risk (09/06/2022)   Humiliation, Afraid, Rape, and Kick questionnaire    Fear of Current or Ex-Partner: No    Emotionally Abused: No    Physically Abused: No    Sexually Abused: No    Review of Systems: ROS negative except for what is noted on the assessment and plan.  Objective:   Vitals:   11/05/23 1435  BP: 137/74  Pulse: 84  Temp: 98.3 F (36.8 C)  TempSrc: Oral  SpO2: 100%  Weight: 166 lb 1.6 oz (75.3 kg)  Height: 5\' 2"  (1.575 m)   Orthostatic VS for the past 24 hrs (Last 3 readings):  BP- Lying Pulse- Lying BP- Sitting Pulse- Sitting BP- Standing at 0 minutes Pulse- Standing at 0 minutes BP- Standing at 3 minutes Pulse- Standing at 3 minutes  11/05/23 1515 149/69 72 154/71 71 141/74 76 151/74 79     Physical Exam: Constitutional: well-appearing, in no acute distress HENT: HINTs exam benign, she did have vertical nystagmus to right Cardiovascular: regular rate and rhythm, no m/r/g Pulmonary/Chest: normal work of breathing on room air, lungs clear to auscultation bilaterally MSK: normal bulk and tone Neurological: alert & oriented x 3, normal gait Skin: warm and dry  Assessment & Plan:  Dizziness A: Patient with episodic dizziness described as room spinning sensation. History also remarkable for bilateral hearing loss and tinnitus. She was referred to audiology recently.  Her HINTs exam was notable for right beating vertical nystagmus. MRI showed to sign of central cause for vertigo. Differentials include BPPV. With hearing loss and tinnitus this could also be Menire disease although much less common. P: Referral for vestibular rehab Information for audiology referral put in AVS  Long-term current use of opiate analgesic for osteoarthritis and fibromyalgia Toxassure updated. Last 4/24 and was appropriate at that time.  Medications include oxycodone  10 mg q 6hrs PRN, celebrex  100 mg, voltarnen gel, flexeril  10 mg PRN, gabapentin  400 mg at bedtime.  DM2 (diabetes mellitus, type 2) (HCC) She  was out of medications for the last several months due to insurance lapse. A1c increased from 8.8 to 10.1. She is currently taking victoza  1.8 mg, jardiance  25 mg, and insulin  glargine 25 units. She notes fasting blood sugars are typically around 130s. P: Continue current medications A1c and lipid panel updated LDL remains at goal for secondary prevention with statin   Patient discussed with Dr. Bettejane Brownie   Camrynn Mcclintic, D.O. [facility]  PGY-3 Pager: [phone]  Phone: [phone] Date 11/06/2023  Time 2:55 PM

## 2023-11-05 NOTE — Patient Instructions (Signed)
 Thank you, [name] for allowing us  to provide your care today.   [name] I am referring you to vestibular rehab with physical therapy. 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 They have some treatment for this if so. At this point I do not think neurology would be helpful.   Hearing loss: audiology [facility] [address]   [address], [place] [address]    Contact Us  [phone]  I am checking you cholesterol and diabetes labs. I am also checking a urine drug screen to get that up to date for the opioid prescribing.  I have ordered the following labs for you:  Lab Orders         Lipid Profile         Hemoglobin A1c         ToxASSURE Select 13 (MW), Urine      Referrals ordered today:   Referral Orders         Ambulatory Referral to Neuro Rehab      I have ordered the following medication/changed the following medications:   Stop the following medications: There are no discontinued medications.   Start the following medications: No orders of the defined types were placed in this encounter.    Follow up:  3 months    We look forward to seeing you next time. Please call our clinic at [phone] if you have any questions or concerns. The best time to call is Monday-Friday from 9am-4pm, but there is someone available 24/7. If after hours or the weekend, call the main hospital number and ask for the Internal Medicine Resident On-Call. If you need medication refills, please notify your pharmacy one week in advance and they will send us  a request.   Thank you for trusting me with your care. Wishing you the best!   Marisa Oscar, DO [facility]

## 2023-11-06 ENCOUNTER — Encounter: Payer: Self-pay | Admitting: Podiatry

## 2023-11-06 LAB — LIPID PANEL
Chol/HDL Ratio: 2.6 ratio (ref 0.0–4.4)
Cholesterol, Total: 172 mg/dL (ref 100–199)
HDL: 65 mg/dL (ref >39)
LDL Chol Calc (NIH): 88 mg/dL (ref 0–99)
Triglycerides: 105 mg/dL (ref 0–149)
VLDL Cholesterol Cal: 19 mg/dL (ref 5–40)

## 2023-11-06 LAB — HEMOGLOBIN A1C
Est. average glucose Bld gHb Est-mCnc: 243 mg/dL
Hgb A1c MFr Bld: 10.1 % — ABNORMAL HIGH (ref 4.8–5.6)

## 2023-11-06 NOTE — Assessment & Plan Note (Signed)
 A: Patient with episodic dizziness described as room spinning sensation. History also remarkable for bilateral hearing loss and tinnitus. She was referred to audiology recently. Her HINTs exam was notable for right beating vertical nystagmus. MRI showed to sign of central cause for vertigo. Differentials include BPPV. With hearing loss and tinnitus this could also be Menire disease although much less common. P: Referral for vestibular rehab Information for audiology referral put in AVS

## 2023-11-06 NOTE — Assessment & Plan Note (Addendum)
 Toxassure updated. Last 4/24 and was appropriate at that time.  Medications include oxycodone  10 mg q 6hrs PRN, celebrex  100 mg, voltarnen gel, flexeril  10 mg PRN, gabapentin  400 mg at bedtime.

## 2023-11-06 NOTE — Assessment & Plan Note (Signed)
 She was out of medications for the last several months due to insurance lapse. A1c increased from 8.8 to 10.1. She is currently taking victoza  1.8 mg, jardiance  25 mg, and insulin  glargine 25 units. She notes fasting blood sugars are typically around 130s. P: Continue current medications A1c and lipid panel updated LDL remains at goal for secondary prevention with statin

## 2023-11-08 LAB — TOXASSURE SELECT,+ANTIDEPR,UR

## 2023-11-12 ENCOUNTER — Ambulatory Visit

## 2023-11-13 ENCOUNTER — Ambulatory Visit: Admitting: Physical Therapy

## 2023-11-14 ENCOUNTER — Telehealth: Payer: Self-pay | Admitting: *Deleted

## 2023-11-14 NOTE — Telephone Encounter (Signed)
 Mammogram appointment rescheduled to May 5.2025 @ 2:40 pm

## 2023-11-15 ENCOUNTER — Ambulatory Visit: Attending: Internal Medicine | Admitting: Physical Therapy

## 2023-11-15 NOTE — Progress Notes (Signed)
 Internal Medicine Clinic Attending  Case discussed with the resident at the time of the visit.  We reviewed the resident's history and exam and pertinent patient test results.  I agree with the assessment, diagnosis, and plan of care documented in the resident's note.

## 2023-11-18 ENCOUNTER — Ambulatory Visit

## 2023-11-18 ENCOUNTER — Telehealth: Payer: Self-pay | Admitting: *Deleted

## 2023-11-18 NOTE — Telephone Encounter (Signed)
 Patieint has reschedule x 2/ Mammogram appointment 11/20/2023 @ 4:10 pm- breast center [phone]

## 2023-11-20 ENCOUNTER — Ambulatory Visit

## 2023-11-20 ENCOUNTER — Encounter: Payer: Self-pay | Admitting: Podiatry

## 2023-11-25 ENCOUNTER — Other Ambulatory Visit: Payer: Self-pay | Admitting: Internal Medicine

## 2023-11-25 ENCOUNTER — Other Ambulatory Visit: Payer: Self-pay

## 2023-11-26 ENCOUNTER — Other Ambulatory Visit: Payer: Self-pay

## 2023-11-26 MED ORDER — MECLIZINE HCL 25 MG PO TABS
25.0000 mg | ORAL_TABLET | Freq: Three times a day (TID) | ORAL | 0 refills | Status: DC | PRN
  Filled 2023-11-26 – 2023-12-06 (×2): qty 30, 10d supply, fill #0

## 2023-11-28 ENCOUNTER — Ambulatory Visit

## 2023-11-29 ENCOUNTER — Other Ambulatory Visit (HOSPITAL_COMMUNITY): Payer: Self-pay

## 2023-11-29 ENCOUNTER — Other Ambulatory Visit: Payer: Self-pay | Admitting: Internal Medicine

## 2023-11-29 ENCOUNTER — Telehealth: Payer: Self-pay | Admitting: Internal Medicine

## 2023-11-29 ENCOUNTER — Other Ambulatory Visit: Payer: Self-pay

## 2023-11-29 DIAGNOSIS — G894 Chronic pain syndrome: Secondary | ICD-10-CM

## 2023-11-29 NOTE — Telephone Encounter (Signed)
 Last Fill: 11/04/23 120 tabs/0 RF  Last OV: 11/05/23 Next OV: None Scheduled  Routing to provider for review/authorization.   Copied from CRM [id number]. Topic: Clinical - Medication Refill >> Nov 29, 2023  2:21 PM Tisa Forester wrote: Medication:  Oxycodone  HCl 10 MG TABS    Has the patient contacted their pharmacy? No (Agent: If no, request that the patient contact the pharmacy for the refill. If patient does not wish to contact the pharmacy document the reason why and proceed with request.) (Agent: If yes, when and what did the pharmacy advise?)  This is the patient's preferred pharmacy:  [place] - [facility] [address], [address] [place] [address] Phone: [phone] Fax: [phone]  [id number] [place], [place] - [address] [place] [address] Phone: [phone] Fax: [phone]  [facility] [address], [address] [place] [address] Phone: [phone] Fax: [phone]  Is this the correct pharmacy for this prescription? Yes If no, delete pharmacy and type the correct one.   Has the prescription been filled recently? No  Is the patient out of the medication? No  Has the patient been seen for an appointment in the last year OR does the patient have an upcoming appointment? Yes  Can we respond through MyChart? No  Agent: Please be advised that Rx refills may take up to 3 business days. We ask that you follow-up with your pharmacy. >> Nov 29, 2023  2:34 PM Tisa Forester wrote: Patient refills go to [facility] , agent forgot to delete the other pharmacy

## 2023-11-29 NOTE — Telephone Encounter (Unsigned)
 Copied from CRM [id number]. Topic: Clinical - Medication Refill >> Nov 29, 2023  2:21 PM Tisa Forester wrote: Medication:  Oxycodone  HCl 10 MG TABS    Has the patient contacted their pharmacy? No (Agent: If no, request that the patient contact the pharmacy for the refill. If patient does not wish to contact the pharmacy document the reason why and proceed with request.) (Agent: If yes, when and what did the pharmacy advise?)  This is the patient's preferred pharmacy:  [place] - [facility] [address] [place] [address] Phone: [phone] Fax: [phone]  [id number] - [place], [place] - [address] [place] [address] Phone: [phone] Fax: [phone]  [facility] - [facility] [address] [place] [address] Phone: [phone] Fax: [phone]  Is this the correct pharmacy for this prescription? Yes If no, delete pharmacy and type the correct one.   Has the prescription been filled recently? No  Is the patient out of the medication? No  Has the patient been seen for an appointment in the last year OR does the patient have an upcoming appointment? Yes  Can we respond through MyChart? No  Agent: Please be advised that Rx refills may take up to 3 business days. We ask that you follow-up with your pharmacy.

## 2023-12-03 ENCOUNTER — Other Ambulatory Visit (HOSPITAL_COMMUNITY): Payer: Self-pay

## 2023-12-03 MED ORDER — OXYCODONE HCL 10 MG PO TABS
10.0000 mg | ORAL_TABLET | Freq: Four times a day (QID) | ORAL | 0 refills | Status: DC | PRN
  Filled 2023-12-03 – 2023-12-05 (×2): qty 120, 30d supply, fill #0

## 2023-12-04 ENCOUNTER — Ambulatory Visit

## 2023-12-04 ENCOUNTER — Other Ambulatory Visit (HOSPITAL_COMMUNITY): Payer: Self-pay

## 2023-12-05 ENCOUNTER — Other Ambulatory Visit (HOSPITAL_COMMUNITY): Payer: Self-pay

## 2023-12-06 ENCOUNTER — Other Ambulatory Visit (HOSPITAL_COMMUNITY): Payer: Self-pay

## 2023-12-13 ENCOUNTER — Ambulatory Visit

## 2023-12-30 ENCOUNTER — Other Ambulatory Visit: Payer: Self-pay | Admitting: Internal Medicine

## 2023-12-30 ENCOUNTER — Other Ambulatory Visit: Payer: Self-pay

## 2023-12-30 MED ORDER — MECLIZINE HCL 25 MG PO TABS
25.0000 mg | ORAL_TABLET | Freq: Three times a day (TID) | ORAL | 0 refills | Status: DC | PRN
  Filled 2023-12-30: qty 90, 30d supply, fill #0

## 2024-01-01 ENCOUNTER — Other Ambulatory Visit (HOSPITAL_COMMUNITY): Payer: Self-pay

## 2024-01-01 ENCOUNTER — Other Ambulatory Visit: Payer: Self-pay | Admitting: Internal Medicine

## 2024-01-01 ENCOUNTER — Other Ambulatory Visit: Payer: Self-pay

## 2024-01-01 DIAGNOSIS — G894 Chronic pain syndrome: Secondary | ICD-10-CM

## 2024-01-01 MED ORDER — OXYCODONE HCL 10 MG PO TABS
10.0000 mg | ORAL_TABLET | Freq: Four times a day (QID) | ORAL | 0 refills | Status: DC | PRN
  Filled 2024-01-03: qty 120, 30d supply, fill #0
  Filled ????-??-??: fill #0

## 2024-01-02 ENCOUNTER — Telehealth: Payer: Self-pay | Admitting: *Deleted

## 2024-01-02 ENCOUNTER — Other Ambulatory Visit (HOSPITAL_COMMUNITY): Payer: Self-pay

## 2024-01-02 NOTE — Telephone Encounter (Signed)
 Call from patient with questions as to when she will be able to get her pain medication.  Prescription was written with pick up on 01/04/2024.  Patient stated that the pharmacy would be closed on Friday and that she would not be able to get her medication.  Call to Pharmacy patient is due for pick up of her pain medication on 01/06/2024.  Pharmacy will have pain medication ready to pick up after 12 noon on tomorrow.

## 2024-01-03 ENCOUNTER — Other Ambulatory Visit (HOSPITAL_COMMUNITY): Payer: Self-pay

## 2024-01-03 ENCOUNTER — Other Ambulatory Visit: Payer: Self-pay

## 2024-01-06 ENCOUNTER — Telehealth: Payer: Self-pay | Admitting: *Deleted

## 2024-01-06 NOTE — Telephone Encounter (Signed)
 LVM for patient regarding scheduling her mammogram spoke with her hin person reagarding this and patient stated she was going to call the breast center and make that appointment. Number given [phone] breast center.

## 2024-01-07 ENCOUNTER — Telehealth: Payer: Self-pay | Admitting: *Deleted

## 2024-01-07 NOTE — Telephone Encounter (Signed)
 Called patient regarding making her mammogram/ lvm for patient to either call the breast center at [phone] or call the [facility] to schedule this request.

## 2024-01-09 ENCOUNTER — Ambulatory Visit

## 2024-01-10 ENCOUNTER — Telehealth: Payer: Self-pay | Admitting: Internal Medicine

## 2024-01-10 NOTE — Telephone Encounter (Signed)
 Attempted to contact, but no answer. Left message that handicap placard has been completed and ready for pickup.  Left instructions that we are here today until 12 pm if she is able to make it before then, if not she can come on Monday.

## 2024-01-14 ENCOUNTER — Ambulatory Visit

## 2024-01-15 ENCOUNTER — Ambulatory Visit

## 2024-01-20 ENCOUNTER — Ambulatory Visit: Admitting: Neurology

## 2024-01-24 ENCOUNTER — Telehealth: Payer: Self-pay | Admitting: Podiatry

## 2024-01-24 NOTE — Telephone Encounter (Signed)
 Reviewing the schedule with the surgery center and pt was not on the schedule. Checked with montie and she said pt canceled when she was out of the office.  No one contacted me to let me know.   I have called the pt and left message for her to please call me to discuss canceling of the surgery.

## 2024-01-27 NOTE — Telephone Encounter (Signed)
 Pt left message stating she needed to reschedule her surgery she has a rash on her legs.  I returned call and pt has rescheduled the surgery to 8/21 and I notified the surgery center as well. She has a rash on her legs for over a week and will be seeing pcp beginning of August.

## 2024-01-28 ENCOUNTER — Other Ambulatory Visit: Payer: Self-pay | Admitting: Internal Medicine

## 2024-01-28 ENCOUNTER — Other Ambulatory Visit (HOSPITAL_COMMUNITY): Payer: Self-pay

## 2024-01-28 DIAGNOSIS — G894 Chronic pain syndrome: Secondary | ICD-10-CM

## 2024-01-28 MED ORDER — OXYCODONE HCL 10 MG PO TABS
10.0000 mg | ORAL_TABLET | Freq: Four times a day (QID) | ORAL | 0 refills | Status: DC | PRN
  Filled 2024-01-31: qty 120, 30d supply, fill #0
  Filled ????-??-??: fill #0

## 2024-01-28 NOTE — Telephone Encounter (Signed)
 Prescription Request  01/28/2024  LOV: 06/18/2023  What is the name of the medication or equipment?  Oxycodone  HCl 10 MG TABS  Have you contacted your pharmacy to request a refill? No   Which pharmacy would you like this sent to?   [facility] Phone: [phone]  Fax: [phone]      Patient notified that their request is being sent to the clinical staff for review and that they should receive a response within 2 business days.   Please advise at [facility] [phone]

## 2024-01-28 NOTE — Telephone Encounter (Signed)
 Last rx written - 01/03/24. Last OV - 11/05/23. Next OV - 02/18/24. TOX - 11/05/23.

## 2024-01-29 LAB — HM DIABETES EYE EXAM

## 2024-01-30 ENCOUNTER — Ambulatory Visit

## 2024-01-31 ENCOUNTER — Other Ambulatory Visit (HOSPITAL_COMMUNITY): Payer: Self-pay

## 2024-02-03 ENCOUNTER — Other Ambulatory Visit (HOSPITAL_COMMUNITY): Payer: Self-pay

## 2024-02-05 ENCOUNTER — Encounter: Payer: Self-pay | Admitting: Podiatry

## 2024-02-06 ENCOUNTER — Ambulatory Visit
Admission: RE | Admit: 2024-02-06 | Discharge: 2024-02-06 | Disposition: A | Discharge status: Home / Self Care | Source: Ambulatory Visit | Attending: Internal Medicine | Admitting: Internal Medicine

## 2024-02-06 DIAGNOSIS — Z1231 Encounter for screening mammogram for malignant neoplasm of breast: Secondary | ICD-10-CM

## 2024-02-12 ENCOUNTER — Encounter: Payer: Self-pay | Admitting: Podiatry

## 2024-02-13 ENCOUNTER — Other Ambulatory Visit: Payer: Self-pay | Admitting: Internal Medicine

## 2024-02-13 ENCOUNTER — Other Ambulatory Visit: Payer: Self-pay

## 2024-02-13 MED ORDER — MECLIZINE HCL 25 MG PO TABS
25.0000 mg | ORAL_TABLET | Freq: Three times a day (TID) | ORAL | 0 refills | Status: DC | PRN
  Filled 2024-02-13: qty 90, 30d supply, fill #0

## 2024-02-17 ENCOUNTER — Other Ambulatory Visit: Payer: Self-pay

## 2024-02-17 ENCOUNTER — Other Ambulatory Visit (HOSPITAL_COMMUNITY): Payer: Self-pay

## 2024-02-18 ENCOUNTER — Encounter: Payer: Self-pay | Admitting: Internal Medicine

## 2024-02-18 ENCOUNTER — Ambulatory Visit: Admitting: Internal Medicine

## 2024-02-18 ENCOUNTER — Other Ambulatory Visit: Payer: Self-pay

## 2024-02-18 ENCOUNTER — Telehealth: Payer: Self-pay | Admitting: Podiatry

## 2024-02-18 VITALS — BP 135/90 | HR 66 | Temp 98.1°F | Ht 62.0 in | Wt 168.4 lb

## 2024-02-18 DIAGNOSIS — Z7984 Long term (current) use of oral hypoglycemic drugs: Secondary | ICD-10-CM | POA: Diagnosis not present

## 2024-02-18 DIAGNOSIS — R030 Elevated blood-pressure reading, without diagnosis of hypertension: Secondary | ICD-10-CM

## 2024-02-18 DIAGNOSIS — E113399 Type 2 diabetes mellitus with moderate nonproliferative diabetic retinopathy without macular edema, unspecified eye: Secondary | ICD-10-CM

## 2024-02-18 DIAGNOSIS — R42 Dizziness and giddiness: Secondary | ICD-10-CM

## 2024-02-18 DIAGNOSIS — M25552 Pain in left hip: Secondary | ICD-10-CM | POA: Diagnosis not present

## 2024-02-18 DIAGNOSIS — E785 Hyperlipidemia, unspecified: Secondary | ICD-10-CM

## 2024-02-18 DIAGNOSIS — D751 Secondary polycythemia: Secondary | ICD-10-CM

## 2024-02-18 DIAGNOSIS — E1165 Type 2 diabetes mellitus with hyperglycemia: Secondary | ICD-10-CM

## 2024-02-18 DIAGNOSIS — Z1211 Encounter for screening for malignant neoplasm of colon: Secondary | ICD-10-CM | POA: Insufficient documentation

## 2024-02-18 DIAGNOSIS — E119 Type 2 diabetes mellitus without complications: Secondary | ICD-10-CM

## 2024-02-18 DIAGNOSIS — Z794 Long term (current) use of insulin: Secondary | ICD-10-CM

## 2024-02-18 LAB — POCT GLYCOSYLATED HEMOGLOBIN (HGB A1C): Hemoglobin A1C: 10.1 % — AB (ref 4.0–5.6)

## 2024-02-18 LAB — GLUCOSE, CAPILLARY: Glucose-Capillary: 293 mg/dL — ABNORMAL HIGH (ref 70–99)

## 2024-02-18 MED ORDER — INSULIN GLARGINE 100 UNIT/ML SOLOSTAR PEN: 38.0000 [IU] | PEN_INJECTOR | Freq: Every day | SUBCUTANEOUS | Status: DC

## 2024-02-18 NOTE — Progress Notes (Signed)
 Established Patient Office Visit  Subjective   Patient ID: [name], female    DOB: 1961/06/01  Age: 63 y.o. MRN: [id number]  Chief Complaint  Patient presents with   Follow-up    Diabetes.Discuss Meclizine . Hearing loss - needs referral.    [name] returns to clinic for follow-up of chronic medical conditions and discussion of vertigo, hearing loss, and referrals. Last seen in [facility] 11/05/23 by Dr. Kenn. Since last visit, Medicaid has been reinstated and coverage available. Please see assessment/plan in problem-based charting for further details of today's visit.    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   Weight loss 12/25/2020   Osteoarthritis 06/05/2020   Obesity (BMI 30-39.9) 04/02/2019   Mixed incontinence urge and stress 01/01/2019   Left hip pain 12/05/2017   Chronic pain 01/31/2017   Restless legs syndrome (RLS) 10/24/2016   Long-term current use of opiate analgesic for osteoarthritis and fibromyalgia 03/01/2016   Dizziness 06/02/2012   Fibromyalgia 05/23/2012   Hyperlipidemia 07/27/2010   DM2 (diabetes mellitus, type 2) (HCC) 07/26/2010      Objective:     BP (!) 135/90 (BP Location: Right Arm, Patient Position: Sitting, Cuff Size: Normal)   Pulse 64   Temp 98.1 F (36.7 C) (Oral)   Ht 5' 2 (1.575 m)   Wt 168 lb 6.4 oz (76.4 kg)   LMP 07/04/2017   SpO2 99% Comment: RA  BMI 30.80 kg/m  BP Readings from Last 3 Encounters:  02/18/24 (!) 135/90  11/05/23 137/74  11/04/23 (!) 110/96   Wt Readings from Last 3 Encounters:  02/18/24 168 lb 6.4 oz (76.4 kg)  11/05/23 166 lb 1.6 oz (75.3 kg)  11/03/23 167 lb 12.3 oz (76.1 kg)     Physical  Exam Constitutional:      General: She is not in acute distress.    Appearance: Normal appearance. She is not ill-appearing.  Pulmonary:     Effort: Pulmonary effort is normal.  Neurological:     General: No focal deficit present.     Mental Status: She is alert.  Psychiatric:        Mood and Affect: Mood normal.        Behavior: Behavior normal.       Assessment & Plan:   Problem List Items Addressed This Visit       Endocrine   DM2 (diabetes mellitus, type 2) (HCC) - Primary (Chronic)   A1c 10.1%, unchanged from 3 months ago. She was able to resume diabetes medications approximately 2 months ago following insurance lapse. Currently using Lantus  35 units daily, Jardiance  25 mg daily, Victoza  1.8 mg daily. Measuring AM blood sugars, 140-150. No lows noted. Not measuring during the day due to limited strips. She reports poor diet recently due to her grandchildren being at the house this summer. We discussed CGM for further evaluation, however she declines due to concerns it will come off with activity. She is amenable to increase in basal insulin . I have asked our diabetes educator to see if additional strips will be covered for at least twice daily BG monitoring. She is motivated to make dietary changes to improve diabetes control with grandkids starting school.   Plan -Increase insulin  glargine  to 38 units daily -Victoza  1.8 mg daily -Empagliflozin  25 mg daily -Counseled on lifestyle changes -Coordinating testing supplies with [facility] -Urine ACR today. -Has been following regularly with retina specialist. Records request signed today. -F/u in 3 months       Relevant Medications   insulin  glargine (LANTUS ) 100 UNIT/ML Solostar Pen   Other Relevant Orders   POCT glycosylated hemoglobin (Hb A1C) (Completed)   Microalbumin / creatinine urine ratio     Other   Hyperlipidemia (Chronic)   Not at goal for primary prevention at last visit. Repeat lipid panel today, discuss change  in statin as indicated. Discussed healthy diet and activity changes.      Relevant Orders   Lipid panel   Left hip pain (Chronic)   Following with orthopedics, s/p left hip steroid injection 09/2023. Continue f/u as needed.      Dizziness   Continued episodic dizziness c/f peripheral cause such as BPPV or Meniere's. Also having tinnitus and hearing loss. She has had prior referrals to ENT and vestibular rehab, not yet established. We discussed the need to see ENT for the above, as well as hearing aid assessment, and vestibular rehab at least one time for evaluation and education on self-management of episodes. Continues on meclizine  which does help although not lasting as long. Do not recommend increased dose due to increased risk of drowsiness. We discussed further management with the above referrals is likely to be more helpful than medication in the long-term.  Plan -Contact info for ENT/audiology referral provided -Referral placed for vestibular rehab, patient is agreeable for at least one time visit      Relevant Orders   Ambulatory Referral to Neuro Rehab   Erythrocytosis   Fluctuating erythrocytosis. CBC for monitoring today.      Relevant Orders   CBC no Diff   Screening for colon cancer   Ms. Yun elects to complete stool testing for colon cancer screening today. Aware of the need for f/u colonoscopy if positive.      Relevant Orders   Fecal occult blood, imunochemical   Elevated BP without diagnosis of hypertension   BP elevated on initial, improved but still mildly elevated on recheck. Ms. Colasurdo intermittently measures home BP with wrist cuff, notes intermittently high. Denies chest pain or edema. No history of diagnosed hypertension or pharmacologic treatment for high BP. We discussed home monitoring for the next 4-6 weeks with return to clinic at that time with log and home cuff for comparison. If remaining high at that time, we will discuss initiating treatment for  hypertension.        Return in about 6 weeks (around 03/31/2024) for f/u BP.    Ronnald Sergeant, MD

## 2024-02-18 NOTE — Telephone Encounter (Signed)
 Pt left message checking to see if you wanted to proceed with surgery on 8/21 as her A1C was elevated. Her pcp asked her to check.  I called pt and her A1C was 10.1 today and she is scheduled for surgery on 03/05/24. Did you want to proceed? Please advise

## 2024-02-18 NOTE — Assessment & Plan Note (Addendum)
 Following with orthopedics, s/p left hip steroid injection 09/2023. Continue f/u as needed.

## 2024-02-18 NOTE — Assessment & Plan Note (Signed)
 Ms. Klinger elects to complete stool testing for colon cancer screening today. Aware of the need for f/u colonoscopy if positive.

## 2024-02-18 NOTE — Assessment & Plan Note (Signed)
 Fluctuating erythrocytosis. CBC for monitoring today.

## 2024-02-18 NOTE — Assessment & Plan Note (Addendum)
 BP elevated on initial, improved but still mildly elevated on recheck. [name] intermittently measures home BP with wrist cuff, notes intermittently high. Denies chest pain or edema. No history of diagnosed hypertension or pharmacologic treatment for high BP. We discussed home monitoring for the next 4-6 weeks with return to clinic at that time with log and home cuff for comparison. If remaining high at that time, we will discuss initiating treatment for hypertension.

## 2024-02-18 NOTE — Assessment & Plan Note (Signed)
 A1c 10.1%, unchanged from 3 months ago. She was able to resume diabetes medications approximately 2 months ago following insurance lapse. Currently using Lantus  35 units daily, Jardiance  25 mg daily, Victoza  1.8 mg daily. Measuring AM blood sugars, 140-150. No lows noted. Not measuring during the day due to limited strips. She reports poor diet recently due to her grandchildren being at the house this summer. We discussed CGM for further evaluation, however she declines due to concerns it will come off with activity. She is amenable to increase in basal insulin . I have asked our diabetes educator to see if additional strips will be covered for at least twice daily BG monitoring. She is motivated to make dietary changes to improve diabetes control with grandkids starting school.   Plan -Increase insulin  glargine to 38 units daily -Victoza  1.8 mg daily -Empagliflozin  25 mg daily -Counseled on lifestyle changes -Coordinating testing supplies with [facility] -Urine ACR today. -Has been following regularly with retina specialist. Records request signed today. -F/u in 3 months

## 2024-02-18 NOTE — Assessment & Plan Note (Signed)
 Not at goal for primary prevention at last visit. Repeat lipid panel today, discuss change in statin as indicated. Discussed healthy diet and activity changes.

## 2024-02-18 NOTE — Patient Instructions (Addendum)
 Increase insulin  to 38 units daily. Monitor your morning blood sugar. Please work on what you're eating to help bring down your A1c!  Please call to make an appointment for hearing loss and dizziness: [facility] [address] [place] [address] [place] [address] [phone]  I will make another referral to vestibular rehab. Please try to go at least once to learn the maneuvers to help the dizziness!  I will check on the referral to Dr. Chesley.

## 2024-02-18 NOTE — Assessment & Plan Note (Signed)
 Continued episodic dizziness c/f peripheral cause such as BPPV or Meniere's. Also having tinnitus and hearing loss. She has had prior referrals to ENT and vestibular rehab, not yet established. We discussed the need to see ENT for the above, as well as hearing aid assessment, and vestibular rehab at least one time for evaluation and education on self-management of episodes. Continues on meclizine  which does help although not lasting as long. Do not recommend increased dose due to increased risk of drowsiness. We discussed further management with the above referrals is likely to be more helpful than medication in the long-term.  Plan -Contact info for ENT/audiology referral provided -Referral placed for vestibular rehab, patient is agreeable for at least one time visit

## 2024-02-19 ENCOUNTER — Other Ambulatory Visit: Payer: Self-pay

## 2024-02-19 LAB — CBC
Hematocrit: 47.5 % — ABNORMAL HIGH (ref 34.0–46.6)
Hemoglobin: 15.1 g/dL (ref 11.1–15.9)
MCH: 29.3 pg (ref 26.6–33.0)
MCHC: 31.8 g/dL (ref 31.5–35.7)
MCV: 92 fL (ref 79–97)
Platelets: 286 x10E3/uL (ref 150–450)
RBC: 5.16 x10E6/uL (ref 3.77–5.28)
RDW: 11.9 % (ref 11.7–15.4)
WBC: 8.2 x10E3/uL (ref 3.4–10.8)

## 2024-02-19 LAB — LIPID PANEL
Chol/HDL Ratio: 2.5 ratio (ref 0.0–4.4)
Cholesterol, Total: 139 mg/dL (ref 100–199)
HDL: 55 mg/dL (ref >39)
LDL Chol Calc (NIH): 60 mg/dL (ref 0–99)
Triglycerides: 139 mg/dL (ref 0–149)
VLDL Cholesterol Cal: 24 mg/dL (ref 5–40)

## 2024-02-19 LAB — MICROALBUMIN / CREATININE URINE RATIO
Creatinine, Urine: 33.1 mg/dL
Microalb/Creat Ratio: <9 mg/g{creat} (ref 0–29)
Microalbumin, Urine: <3 ug/mL

## 2024-02-20 ENCOUNTER — Ambulatory Visit: Payer: Self-pay | Admitting: Internal Medicine

## 2024-02-20 ENCOUNTER — Other Ambulatory Visit: Payer: Self-pay

## 2024-02-20 DIAGNOSIS — Z794 Long term (current) use of insulin: Secondary | ICD-10-CM

## 2024-02-20 MED ORDER — ACCU-CHEK GUIDE TEST VI STRP
ORAL_STRIP | 3 refills | Status: AC
  Filled 2024-02-20: qty 100, 33d supply, fill #0
  Filled 2024-05-01: qty 100, 33d supply, fill #1
  Filled 2024-07-31: qty 100, 33d supply, fill #0

## 2024-02-20 MED ORDER — ACCU-CHEK GUIDE ME W/DEVICE KIT
1.0000 | PACK | Freq: Three times a day (TID) | 1 refills | Status: AC
  Filled 2024-02-20: qty 1, 1d supply, fill #0
  Filled 2024-02-20: qty 1, 30d supply, fill #0
  Filled 2024-03-17: qty 1, 1d supply, fill #0

## 2024-02-20 NOTE — Progress Notes (Signed)
 Urine ACR negative for proteinuria. Reviewed with Ms. Nicolaou 02/20/24.

## 2024-02-20 NOTE — Progress Notes (Signed)
 Lipids at goal for primary prevention. Continue atorvastatin  20 mg daily. Discussed lifestyle changes at last visit. Reviewed these results with Ms. Venturino 02/20/24.

## 2024-02-20 NOTE — Progress Notes (Signed)
 Hemoglobin normalized. Hgb/hct not currently at threshold for erythrocytosis. Continue to monitor. Discussed with [name] 02/20/24.

## 2024-02-25 ENCOUNTER — Other Ambulatory Visit: Payer: Self-pay

## 2024-02-26 ENCOUNTER — Encounter: Payer: Self-pay | Admitting: Podiatry

## 2024-02-27 ENCOUNTER — Other Ambulatory Visit: Payer: Self-pay

## 2024-02-28 ENCOUNTER — Other Ambulatory Visit: Payer: Self-pay

## 2024-03-02 ENCOUNTER — Ambulatory Visit: Attending: Internal Medicine

## 2024-03-02 ENCOUNTER — Other Ambulatory Visit (HOSPITAL_COMMUNITY): Payer: Self-pay

## 2024-03-02 ENCOUNTER — Other Ambulatory Visit: Payer: Self-pay | Admitting: Internal Medicine

## 2024-03-02 DIAGNOSIS — G894 Chronic pain syndrome: Secondary | ICD-10-CM

## 2024-03-02 NOTE — Telephone Encounter (Signed)
 Last rx written - 01/31/24. Last OV - 02/18/24. TOX - 11/05/23.

## 2024-03-03 ENCOUNTER — Other Ambulatory Visit: Payer: Self-pay

## 2024-03-03 ENCOUNTER — Other Ambulatory Visit (HOSPITAL_COMMUNITY): Payer: Self-pay

## 2024-03-03 MED ORDER — OXYCODONE HCL 10 MG PO TABS
10.0000 mg | ORAL_TABLET | Freq: Four times a day (QID) | ORAL | 0 refills | Status: DC | PRN
  Filled 2024-03-03: qty 120, 30d supply, fill #0

## 2024-03-05 ENCOUNTER — Other Ambulatory Visit (HOSPITAL_COMMUNITY): Payer: Self-pay

## 2024-03-09 ENCOUNTER — Other Ambulatory Visit: Payer: Self-pay

## 2024-03-09 ENCOUNTER — Other Ambulatory Visit (HOSPITAL_COMMUNITY): Payer: Self-pay

## 2024-03-09 ENCOUNTER — Other Ambulatory Visit: Payer: Self-pay | Admitting: Internal Medicine

## 2024-03-09 DIAGNOSIS — E1165 Type 2 diabetes mellitus with hyperglycemia: Secondary | ICD-10-CM

## 2024-03-09 DIAGNOSIS — G894 Chronic pain syndrome: Secondary | ICD-10-CM

## 2024-03-10 ENCOUNTER — Other Ambulatory Visit: Payer: Self-pay

## 2024-03-10 ENCOUNTER — Other Ambulatory Visit (HOSPITAL_COMMUNITY): Payer: Self-pay

## 2024-03-10 DIAGNOSIS — E1165 Type 2 diabetes mellitus with hyperglycemia: Secondary | ICD-10-CM

## 2024-03-10 MED ORDER — CELECOXIB 100 MG PO CAPS
100.0000 mg | ORAL_CAPSULE | Freq: Every day | ORAL | 3 refills | Status: AC | PRN
  Filled 2024-03-10 – 2024-03-13 (×2): qty 30, 30d supply, fill #0
  Filled 2024-05-01 (×2): qty 30, 30d supply, fill #1
  Filled 2024-06-16: qty 30, 30d supply, fill #0

## 2024-03-10 MED ORDER — INSULIN GLARGINE 100 UNIT/ML SOLOSTAR PEN
38.0000 [IU] | PEN_INJECTOR | Freq: Every day | SUBCUTANEOUS | 1 refills | Status: DC
  Filled 2024-03-10 – 2024-06-05 (×4): qty 30, 78d supply, fill #0

## 2024-03-10 MED ORDER — LIRAGLUTIDE 18 MG/3ML ~~LOC~~ SOPN
1.8000 mg | PEN_INJECTOR | Freq: Every day | SUBCUTANEOUS | 3 refills | Status: DC
  Filled 2024-03-10 – 2024-03-19 (×2): qty 9, 30d supply, fill #0
  Filled 2024-05-01: qty 9, 30d supply, fill #1
  Filled 2024-05-26 – 2024-06-18 (×2): qty 9, 30d supply, fill #2
  Filled 2024-07-20: qty 9, 30d supply, fill #3

## 2024-03-10 NOTE — Telephone Encounter (Signed)
 Last rx not sent due to no print Will send to pcp to confirm dose and send to [facility]

## 2024-03-11 ENCOUNTER — Encounter: Payer: Self-pay | Admitting: Podiatry

## 2024-03-11 NOTE — Telephone Encounter (Signed)
 Rtn call to the patient no answer.  F/u with Eye Referrals and no Eye Referral have been placed for 2025. Unclear where the patient was referred and when as no Referral are on file for an Eye Exam.  Copied from CRM [id number]. Topic: Referral - Status >> Mar 11, 2024 12:02 PM Gra[facility] ORN wrote: Reason for CRM: Patient called. States she wanted to speak to clinic. Was referred to schedule eye exam. Called and they do not have any opening until May. Patient would like a call back to discuss. Thank You

## 2024-03-13 ENCOUNTER — Other Ambulatory Visit: Payer: Self-pay

## 2024-03-17 ENCOUNTER — Other Ambulatory Visit: Payer: Self-pay

## 2024-03-18 ENCOUNTER — Encounter: Payer: Self-pay | Admitting: Podiatry

## 2024-03-20 ENCOUNTER — Other Ambulatory Visit (HOSPITAL_COMMUNITY): Payer: Self-pay

## 2024-03-25 ENCOUNTER — Other Ambulatory Visit: Payer: Self-pay | Admitting: Internal Medicine

## 2024-03-25 ENCOUNTER — Other Ambulatory Visit: Payer: Self-pay

## 2024-03-25 ENCOUNTER — Other Ambulatory Visit (HOSPITAL_COMMUNITY): Payer: Self-pay

## 2024-03-25 DIAGNOSIS — G894 Chronic pain syndrome: Secondary | ICD-10-CM

## 2024-03-26 ENCOUNTER — Other Ambulatory Visit: Payer: Self-pay

## 2024-03-27 ENCOUNTER — Other Ambulatory Visit (HOSPITAL_COMMUNITY): Payer: Self-pay

## 2024-03-27 MED ORDER — OXYCODONE HCL 10 MG PO TABS
10.0000 mg | ORAL_TABLET | Freq: Four times a day (QID) | ORAL | 0 refills | Status: DC | PRN
  Filled 2024-03-27 – 2024-04-02 (×2): qty 120, 30d supply, fill #0

## 2024-03-30 ENCOUNTER — Other Ambulatory Visit: Payer: Self-pay

## 2024-04-01 ENCOUNTER — Encounter: Payer: Self-pay | Admitting: Podiatry

## 2024-04-02 ENCOUNTER — Other Ambulatory Visit (HOSPITAL_COMMUNITY): Payer: Self-pay

## 2024-04-03 ENCOUNTER — Other Ambulatory Visit (HOSPITAL_COMMUNITY): Payer: Self-pay

## 2024-04-06 ENCOUNTER — Telehealth: Payer: Self-pay

## 2024-04-06 NOTE — Telephone Encounter (Signed)
 Prior Authorization for patient (oxyCODONE  HCl 10MG  tablets) came through on cover my meds was submitted with last office notes awaiting approval or denial.  XZB:[id number]

## 2024-04-07 ENCOUNTER — Other Ambulatory Visit (HOSPITAL_COMMUNITY): Payer: Self-pay

## 2024-04-07 NOTE — Telephone Encounter (Addendum)
 Dear Dr. Elsie Savannah: [facility] reviewed your OXYCODONE  HCL (IR) 10 MG TAB request for the above-identified  member, and it is denied for the following reason: because we did not see what we need to  approve the drug you asked for, (oxycodone  10 milligram tablet). We may be able to approve  this drug when we see certain records (documentation describing why you need to continue  using this [opioid] treatment and a current plan of care). We based this decision on your health  plan's prior authorization clinical criteria named Opioid Analgesics.  The pharmacy will only fill a 5 day supply.  Patient is aware of the denial, she stated that she has enough medication for this month she has been scheduled a telehealth with Dr.Patel on 10/8 to discuss further.

## 2024-04-08 ENCOUNTER — Other Ambulatory Visit: Payer: Self-pay

## 2024-04-22 ENCOUNTER — Other Ambulatory Visit (HOSPITAL_COMMUNITY): Payer: Self-pay

## 2024-04-22 ENCOUNTER — Telehealth: Admitting: Student

## 2024-04-22 ENCOUNTER — Encounter: Payer: Self-pay | Admitting: Student

## 2024-04-22 DIAGNOSIS — M797 Fibromyalgia: Secondary | ICD-10-CM

## 2024-04-22 DIAGNOSIS — G8929 Other chronic pain: Secondary | ICD-10-CM

## 2024-04-22 DIAGNOSIS — G894 Chronic pain syndrome: Secondary | ICD-10-CM

## 2024-04-22 MED ORDER — OXYCODONE HCL 10 MG PO TABS
10.0000 mg | ORAL_TABLET | Freq: Four times a day (QID) | ORAL | 0 refills | Status: DC | PRN
Start: 2024-05-01 — End: 2024-06-03
  Filled 2024-05-01: qty 120, 30d supply, fill #0

## 2024-04-22 NOTE — Assessment & Plan Note (Addendum)
 Patient has a past medical history of fibromyalgia and chronic pain.  She has been treated with Celebrex  100 mg daily, Flexeril  3 times daily as needed, gabapentin  100 mg nightly, and oxycodone  10 mg every 6 hours as needed.  Patient is well-managed with this current regimen.  She has a pain contract that has been signed with our office since March 12, 2023.  Patient has been adherent.  Tox assure is always been appropriate.  This has helped her quality of life, and is able to be a participating member of society because of the oxycodone  that has been prescribed to her.  Will continue with current regimen. She continues to follow with her pain doctor with her next appointment on 05/08/2024.   Plan:  -Continue oxycodone  10 mg q6h prn, Celebrex  100 mg daily, topical diclofenac , Flexeril  10 mg tid prn -Gabapentin  400 mg qhs

## 2024-04-22 NOTE — Progress Notes (Signed)
 Virtual Visit via Video Note  I connected with [name] on 04/22/24 at  9:45 AM EDT by a video enabled telemedicine application and verified that I am speaking with the correct person using two identifiers.  Patient Location: Home Provider Location: Home Office  I discussed the limitations, risks, security, and privacy concerns of performing an evaluation and management service by video and the availability of in person appointments. I also discussed with the patient that there may be a patient responsible charge related to this service. The patient expressed understanding and agreed to proceed.  Subjective: PCP: Karna Fellows, MD  This is as 63 year old female with a past medical history of chronic pain who is presenting today for follow up on her chronic pain. Please see assessment and plan for full HPI.    ROS: Per HPI  Current Outpatient Medications:    atorvastatin  (LIPITOR) 20 MG tablet, Take 1 tablet (20 mg total) by mouth daily., Disp: 90 tablet, Rfl: 3   Blood Glucose Monitoring Suppl (ACCU-CHEK GUIDE ME) w/Device KIT, Use to check blood sugar 3 times a day., Disp: 1 kit, Rfl: 1   celecoxib  (CELEBREX ) 100 MG capsule, Take 1 capsule (100 mg total) by mouth daily as needed for moderate pain (pain score 4-6)., Disp: 30 capsule, Rfl: 3   cetirizine  (ZYRTEC ) 10 MG tablet, TAKE 1 TABLET BY MOUTH EVERY DAY, Disp: 30 tablet, Rfl: 11   cyclobenzaprine  (FLEXERIL ) 10 MG tablet, Take 1 tablet (10 mg total) by mouth 3 (three) times daily as needed for muscle spasms., Disp: 90 tablet, Rfl: 3   diclofenac  Sodium (VOLTAREN ) 1 % GEL, Apply 4 g topically 4 (four) times daily., Disp: 300 g, Rfl: 1   empagliflozin  (JARDIANCE ) 25 MG TABS tablet, Take 1 tablet (25 mg total) by mouth daily., Disp: 90 tablet, Rfl: 3   gabapentin  (NEURONTIN ) 400 MG capsule, Take 1 capsule (400 mg total) by mouth at bedtime., Disp: 90 capsule, Rfl: 3   glucose blood (ACCU-CHEK GUIDE TEST) test strip, Use to check  blood sugar levels up to 3 (three) times daily., Disp: 270 each, Rfl: 3   glycopyrrolate  (ROBINUL ) 1 MG tablet, Take 1 tablet (1 mg total) by mouth at bedtime., Disp: 90 tablet, Rfl: 3   insulin  glargine (LANTUS ) 100 UNIT/ML Solostar Pen, Inject 38 Units into the skin daily., Disp: 30 mL, Rfl: 1   Insulin  Pen Needle (PEN NEEDLES) 32G X 4 MM MISC, Use with Victoza  and Lantus , Disp: 400 each, Rfl: 0   liraglutide  (VICTOZA ) 18 MG/3ML SOPN, Inject 1.8 mg into the skin daily., Disp: 9 mL, Rfl: 3   meclizine  (ANTIVERT ) 25 MG tablet, Take 1 tablet (25 mg total) by mouth 3 (three) times daily as needed for dizziness., Disp: 90 tablet, Rfl: 0   naloxone  (NARCAN ) 0.4 MG/ML injection, Inject 1 mL (0.4 mg total) into the muscle as needed (for opioid overdose or respiratory depression)., Disp: 1 mL, Rfl: 0   oxybutynin  (DITROPAN -XL) 10 MG 24 hr tablet, Take 1 tablet (10 mg total) by mouth daily., Disp: 90 tablet, Rfl: 0   Oxycodone  HCl 10 MG TABS, Take 1 tablet (10 mg total) by mouth every 6 (six) hours as needed., Disp: 120 tablet, Rfl: 0   pantoprazole  (PROTONIX ) 40 MG tablet, TAKE 1 TABLET BY MOUTH EVERY DAY, Disp: 90 tablet, Rfl: 1   Accu-Chek Softclix Lancets lancets, Use as directed to check blood sugar levels three times a day., Disp: 300 each, Rfl: 1  Observations/Objective: There  were no vitals filed for this visit. General: Patient is resting comfortably in bed in no acute distress  Head: Normocephalic, atraumatic   Assessment and Plan: Assessment & Plan Other chronic pain Patient has a past medical history of fibromyalgia and chronic pain.  She has been treated with Celebrex  100 mg daily, Flexeril  3 times daily as needed, gabapentin  100 mg nightly, and oxycodone  10 mg every 6 hours as needed.  Patient is well-managed with this current regimen.  She has a pain contract that has been signed with our office since March 12, 2023.  Patient has been adherent.  Tox assure is always been appropriate.   This has helped her quality of life, and is able to be a participating member of society because of the oxycodone  that has been prescribed to her.  Will continue with current regimen. She continues to follow with her pain doctor with her next appointment on 05/08/2024.   Plan:  -Continue oxycodone  10 mg q6h prn, Celebrex  100 mg daily, topical diclofenac , Flexeril  10 mg tid prn -Gabapentin  400 mg qhs       Follow Up Instructions: No follow-ups on file.     The patient was advised to call back or seek an in-person evaluation if the symptoms worsen or if the condition fails to improve as anticipated.    Patient discussed with Dr. Karna Libby Blanch, DO

## 2024-04-23 NOTE — Progress Notes (Signed)
 Internal Medicine Clinic Attending  Case discussed with the resident at the time of the visit.  We reviewed the resident's history and exam and pertinent patient test results.  I agree with the assessment, diagnosis, and plan of care documented in the resident's note.

## 2024-05-01 ENCOUNTER — Other Ambulatory Visit (HOSPITAL_COMMUNITY): Payer: Self-pay

## 2024-05-01 ENCOUNTER — Other Ambulatory Visit: Payer: Self-pay | Admitting: Internal Medicine

## 2024-05-01 DIAGNOSIS — G894 Chronic pain syndrome: Secondary | ICD-10-CM

## 2024-05-01 DIAGNOSIS — N3946 Mixed incontinence: Secondary | ICD-10-CM

## 2024-05-04 ENCOUNTER — Telehealth: Payer: Self-pay

## 2024-05-04 ENCOUNTER — Other Ambulatory Visit (HOSPITAL_COMMUNITY): Payer: Self-pay

## 2024-05-04 ENCOUNTER — Other Ambulatory Visit: Payer: Self-pay

## 2024-05-04 MED ORDER — OXYBUTYNIN CHLORIDE ER 10 MG PO TB24
10.0000 mg | ORAL_TABLET | Freq: Every day | ORAL | 0 refills | Status: DC
  Filled 2024-05-04: qty 90, 90d supply, fill #0

## 2024-05-04 MED ORDER — CYCLOBENZAPRINE HCL 10 MG PO TABS
10.0000 mg | ORAL_TABLET | Freq: Three times a day (TID) | ORAL | 3 refills | Status: AC | PRN
  Filled 2024-05-04: qty 90, 30d supply, fill #0

## 2024-05-04 NOTE — Telephone Encounter (Signed)
 Date: 05/04/2024 To: Dr. Libby Blanch Fax number: [phone] Subject: Pharmacy prior authorization request number [id number] Regarding member: [id number]; [name]; [birth year] Dear Dr. Libby Blanch: [facility] reviewed your OXYCODONE  HCL (IR) 10 MG TAB request for the above-identified  member, and it is denied for the following reason: because we did not see what we need to  approve the drug you asked for, (oxycodone  10 milligram tablet). We may be able to approve  this drug when we see certain records (documentation describing why you need to continue  using this [opioid] treatment and a current plan of care). We based this decision on your health  plan's prior authorization clinical criteria named Opioid Analgesics.   I called and spoke to the patient she stated she picked up the rx on Friday 05/01/24.

## 2024-05-04 NOTE — Telephone Encounter (Signed)
 Prior Authorization for patient (oxyCODONE  HCl 10MG  tablets) came through on cover my meds was submitted with last office notes awaiting approval or denial.  XZB:[id number]

## 2024-05-04 NOTE — Telephone Encounter (Addendum)
 Hey Dr.Lau, I did not receive any further information from the patients insurance besides what was copy and pasted from cover my meds.

## 2024-05-05 ENCOUNTER — Other Ambulatory Visit: Payer: Self-pay | Admitting: Internal Medicine

## 2024-05-05 ENCOUNTER — Other Ambulatory Visit: Payer: Self-pay

## 2024-05-05 ENCOUNTER — Ambulatory Visit: Admitting: Neurology

## 2024-05-05 MED ORDER — MECLIZINE HCL 25 MG PO TABS
25.0000 mg | ORAL_TABLET | Freq: Three times a day (TID) | ORAL | 0 refills | Status: DC | PRN
  Filled 2024-05-05 – 2024-05-15 (×2): qty 90, 30d supply, fill #0

## 2024-05-08 ENCOUNTER — Other Ambulatory Visit (HOSPITAL_COMMUNITY): Payer: Self-pay

## 2024-05-14 ENCOUNTER — Other Ambulatory Visit: Payer: Self-pay

## 2024-05-15 ENCOUNTER — Other Ambulatory Visit (HOSPITAL_COMMUNITY): Payer: Self-pay

## 2024-05-18 ENCOUNTER — Encounter: Payer: Self-pay | Admitting: Radiology

## 2024-05-26 ENCOUNTER — Other Ambulatory Visit: Payer: Self-pay

## 2024-05-26 ENCOUNTER — Other Ambulatory Visit (HOSPITAL_COMMUNITY): Payer: Self-pay

## 2024-06-03 ENCOUNTER — Other Ambulatory Visit: Payer: Self-pay | Admitting: Student

## 2024-06-03 ENCOUNTER — Other Ambulatory Visit (HOSPITAL_COMMUNITY): Payer: Self-pay

## 2024-06-03 DIAGNOSIS — G894 Chronic pain syndrome: Secondary | ICD-10-CM

## 2024-06-04 ENCOUNTER — Telehealth: Payer: Self-pay

## 2024-06-04 ENCOUNTER — Telehealth (HOSPITAL_COMMUNITY): Payer: Self-pay

## 2024-06-04 ENCOUNTER — Other Ambulatory Visit (HOSPITAL_COMMUNITY): Payer: Self-pay

## 2024-06-04 MED ORDER — OXYCODONE HCL 10 MG PO TABS
10.0000 mg | ORAL_TABLET | Freq: Four times a day (QID) | ORAL | 0 refills | Status: DC | PRN
  Filled 2024-06-04: qty 120, 30d supply, fill #0

## 2024-06-04 NOTE — Telephone Encounter (Signed)
 PA request has been Received. New Encounter has been or will be created for follow up. For additional info see Pharmacy Prior Auth telephone encounter from 06/04/24.

## 2024-06-04 NOTE — Telephone Encounter (Signed)
 Dear Dr. Ronnald Sergeant: [facility] reviewed your OXYCODONE  HCL (IR) 10 MG TAB request for the above-identified  member, and it is denied for the following reason: because we did not see what we need to  approve the drug you asked for, (oxycodone ). We may be able to approve this drug when we  see certain records (current documentation describing why you need to continue using this  [opioid] treatment and a current plan of care). If we receive these records, we may need more  information. We based this decision on your health plan's prior authorization clinical criteria  named Opioid Analgesics.   I called and spoke to the patient she is aware of the denial (pa has been denied previously). Patient stated she normally pays OOP. I called and spoke to Deandra at the pharmacy she stated the cost of the medication is $30.53. Patient is aware.

## 2024-06-04 NOTE — Telephone Encounter (Signed)
 Prior Authorization for patient (oxyCODONE  HCl 10MG  tablets) came through on cover my meds was submitted with last office notes awaiting approval or denial.  XZB:[id number]

## 2024-06-05 ENCOUNTER — Other Ambulatory Visit (HOSPITAL_COMMUNITY): Payer: Self-pay

## 2024-06-10 ENCOUNTER — Other Ambulatory Visit: Payer: Self-pay | Admitting: Internal Medicine

## 2024-06-10 DIAGNOSIS — E113393 Type 2 diabetes mellitus with moderate nonproliferative diabetic retinopathy without macular edema, bilateral: Secondary | ICD-10-CM

## 2024-06-16 ENCOUNTER — Other Ambulatory Visit (HOSPITAL_COMMUNITY): Payer: Self-pay

## 2024-06-16 ENCOUNTER — Other Ambulatory Visit: Payer: Self-pay | Admitting: Internal Medicine

## 2024-06-16 ENCOUNTER — Other Ambulatory Visit: Payer: Self-pay | Admitting: Student

## 2024-06-16 ENCOUNTER — Other Ambulatory Visit: Payer: Self-pay

## 2024-06-16 DIAGNOSIS — E113393 Type 2 diabetes mellitus with moderate nonproliferative diabetic retinopathy without macular edema, bilateral: Secondary | ICD-10-CM

## 2024-06-16 DIAGNOSIS — G894 Chronic pain syndrome: Secondary | ICD-10-CM

## 2024-06-16 MED ORDER — PEN NEEDLES 32G X 4 MM MISC
1.0000 | Freq: Every day | 0 refills | Status: AC
  Filled 2024-06-16: qty 100, 34d supply, fill #0
  Filled 2024-07-31: qty 100, 34d supply, fill #1

## 2024-06-16 MED ORDER — DICLOFENAC SODIUM 1 % EX GEL
4.0000 g | Freq: Four times a day (QID) | CUTANEOUS | 1 refills | Status: AC
  Filled 2024-06-16 – 2024-07-20 (×2): qty 300, 30d supply, fill #0

## 2024-06-18 ENCOUNTER — Other Ambulatory Visit (HOSPITAL_COMMUNITY): Payer: Self-pay

## 2024-06-19 ENCOUNTER — Other Ambulatory Visit (HOSPITAL_COMMUNITY): Payer: Self-pay

## 2024-07-01 ENCOUNTER — Other Ambulatory Visit: Payer: Self-pay

## 2024-07-01 ENCOUNTER — Other Ambulatory Visit: Payer: Self-pay | Admitting: Internal Medicine

## 2024-07-01 ENCOUNTER — Other Ambulatory Visit: Payer: Self-pay | Admitting: Student

## 2024-07-01 ENCOUNTER — Other Ambulatory Visit (HOSPITAL_COMMUNITY): Payer: Self-pay

## 2024-07-01 DIAGNOSIS — N3946 Mixed incontinence: Secondary | ICD-10-CM

## 2024-07-01 DIAGNOSIS — E113393 Type 2 diabetes mellitus with moderate nonproliferative diabetic retinopathy without macular edema, bilateral: Secondary | ICD-10-CM

## 2024-07-01 DIAGNOSIS — G894 Chronic pain syndrome: Secondary | ICD-10-CM

## 2024-07-01 MED ORDER — OXYCODONE HCL 10 MG PO TABS
10.0000 mg | ORAL_TABLET | Freq: Four times a day (QID) | ORAL | 0 refills | Status: DC | PRN
  Filled 2024-07-01: qty 120, 30d supply, fill #0
  Filled ????-??-??: fill #0

## 2024-07-01 MED ORDER — MECLIZINE HCL 25 MG PO TABS
25.0000 mg | ORAL_TABLET | Freq: Three times a day (TID) | ORAL | 0 refills | Status: AC | PRN
  Filled 2024-07-01: qty 90, 30d supply, fill #0

## 2024-07-01 MED ORDER — OXYBUTYNIN CHLORIDE ER 10 MG PO TB24
10.0000 mg | ORAL_TABLET | Freq: Every day | ORAL | 0 refills | Status: AC
  Filled 2024-07-01 – 2024-07-20 (×2): qty 90, 90d supply, fill #0

## 2024-07-03 ENCOUNTER — Other Ambulatory Visit (HOSPITAL_COMMUNITY): Payer: Self-pay

## 2024-07-14 ENCOUNTER — Other Ambulatory Visit: Payer: Self-pay

## 2024-07-20 ENCOUNTER — Other Ambulatory Visit (HOSPITAL_COMMUNITY): Payer: Self-pay

## 2024-07-20 DIAGNOSIS — E1165 Type 2 diabetes mellitus with hyperglycemia: Secondary | ICD-10-CM

## 2024-07-21 ENCOUNTER — Other Ambulatory Visit (HOSPITAL_COMMUNITY): Payer: Self-pay

## 2024-07-21 MED ORDER — LANTUS SOLOSTAR 100 UNIT/ML ~~LOC~~ SOPN
38.0000 [IU] | PEN_INJECTOR | Freq: Every day | SUBCUTANEOUS | 1 refills | Status: AC
  Filled 2024-07-21: qty 30, 78d supply, fill #0

## 2024-07-27 ENCOUNTER — Other Ambulatory Visit (HOSPITAL_COMMUNITY): Payer: Self-pay

## 2024-07-31 ENCOUNTER — Other Ambulatory Visit: Payer: Self-pay

## 2024-07-31 ENCOUNTER — Other Ambulatory Visit (HOSPITAL_COMMUNITY): Payer: Self-pay

## 2024-07-31 ENCOUNTER — Other Ambulatory Visit: Payer: Self-pay | Admitting: Student

## 2024-07-31 DIAGNOSIS — G894 Chronic pain syndrome: Secondary | ICD-10-CM

## 2024-07-31 DIAGNOSIS — E113393 Type 2 diabetes mellitus with moderate nonproliferative diabetic retinopathy without macular edema, bilateral: Secondary | ICD-10-CM

## 2024-08-03 ENCOUNTER — Other Ambulatory Visit (HOSPITAL_COMMUNITY): Payer: Self-pay

## 2024-08-03 MED ORDER — EMPAGLIFLOZIN 25 MG PO TABS
25.0000 mg | ORAL_TABLET | Freq: Every day | ORAL | 3 refills | Status: AC
  Filled 2024-08-03: qty 90, 90d supply, fill #0

## 2024-08-03 MED ORDER — OXYCODONE HCL 10 MG PO TABS
10.0000 mg | ORAL_TABLET | Freq: Four times a day (QID) | ORAL | 0 refills | Status: AC | PRN
  Filled 2024-08-03: qty 120, 30d supply, fill #0

## 2024-08-03 NOTE — Telephone Encounter (Signed)
 Jardiance  sent to [facility] pharmacy and discontinued from mail pharmacy.

## 2024-08-12 ENCOUNTER — Ambulatory Visit: Payer: Self-pay | Admitting: Student

## 2024-08-12 ENCOUNTER — Encounter: Payer: Self-pay | Admitting: Student

## 2024-08-12 ENCOUNTER — Other Ambulatory Visit: Payer: Self-pay

## 2024-08-12 ENCOUNTER — Ambulatory Visit: Payer: Self-pay

## 2024-08-12 ENCOUNTER — Ambulatory Visit: Admitting: Student

## 2024-08-12 ENCOUNTER — Other Ambulatory Visit (HOSPITAL_COMMUNITY): Payer: Self-pay

## 2024-08-12 VITALS — BP 148/75 | HR 77 | Temp 98.1°F | Ht 62.0 in | Wt 181.4 lb

## 2024-08-12 DIAGNOSIS — W19XXXA Unspecified fall, initial encounter: Secondary | ICD-10-CM

## 2024-08-12 DIAGNOSIS — M25532 Pain in left wrist: Secondary | ICD-10-CM

## 2024-08-12 DIAGNOSIS — W009XXA Unspecified fall due to ice and snow, initial encounter: Secondary | ICD-10-CM

## 2024-08-12 MED ORDER — DICLOFENAC SODIUM 1 % EX GEL
4.0000 g | Freq: Four times a day (QID) | CUTANEOUS | 2 refills | Status: AC
  Filled 2024-08-12: qty 100, 10d supply, fill #0

## 2024-08-12 NOTE — Progress Notes (Signed)
 ACD bandage was applied to left wrist / pulse checked.

## 2024-08-12 NOTE — Patient Instructions (Addendum)
 [name], Thank you for allowing me to take part in your care today.  Here are your instructions.  1.  I will call you with the results of the x-ray.  2.  If you would like faster care, please go to the [facility] health orthopedics at [facility] [phone]  [address], [place] [address]  3.  For the pain, please alternate Tylenol  and ibuprofen .  You can also use Voltaren  gel.  I have written this for you.  PLEASE BRING YOUR MEDICATIONS TO EVERY APPOINTMENT  Thank you, Dr. Tobie  If you have any other questions please contact the internal medicine clinic at [phone] If it is after hours, please call the [facility] at [phone] and then ask the person who picks up for the resident on call.

## 2024-08-12 NOTE — Telephone Encounter (Signed)
 Pt called to reschedule for earlier appt.

## 2024-08-12 NOTE — Progress Notes (Signed)
 "  CC: Fall  HPI:  [name] is a 64 y.o. female with past medical history of diabetes, chronic pain, hyperlipidemia, fibromyalgia who presents for acute visit.  Patient had a fall.  Please see assessment and plan for full HPI.   Past Medical History:  Diagnosis Date   Adhesive capsulitis of right shoulder    with underlying tendinopathy rotator cuff   Arthritis    Diabetes mellitus    oral tx   Fibromyalgia    GERD (gastroesophageal reflux disease)    Healthcare maintenance 03/01/2011   History of post-sterilization tuboplasty 2000   Pain of left thumb 01/10/2022   Acute pain in the left thumb after fall on an inclined surface on 01/06/22. Pain getting worse and movement decreasing since the injury. Pain located more at base than distal thumb. Patient has been using ice and ibuprofen  and verapamil cream. Home pain meds include oxycodone , celebrex  100-200 mg qd, voltaren  gel,  verapamil cream.      Plantar fasciitis    Right   Shortness of breath    with exertion   Sleep apnea 5 plus yrs   study -pt could not sleep test inconclusive.    Tear of meniscus of left knee    x2   Tear of meniscus of right knee     Current Medications[1]  Review of Systems:    MSK: Patient endorses left wrist pain  Physical Exam:  Vitals:   08/12/24 1445  BP: (!) 148/75  Pulse: 77  Temp: 98.1 F (36.7 C)  TempSrc: Oral  SpO2: 98%  Weight: 181 lb 6.4 oz (82.3 kg)  Height: 5' 2 (1.575 m)   General: Patient is sitting comfortably in the room  Head: Normocephalic, atraumatic  Extremities: Right upper extremity unremarkable.  Left upper extremity with normal strength on left shoulder abduction and adduction.  Left elbow with 5/5 strength noted on elbow flexion and elbow extension.  Left wrist with decreased range of motion in the setting of pain.  Obvious edema noted to left wrist.  There is some bony tenderness appreciated along the distal left ulna.  2+ radial pulse on left  upper extremity.  Sensation intact to left upper extremity.   Assessment & Plan:   Assessment & Plan Fall, initial encounter Patient presents today with concerns of acute fall.  Patient states yesterday she went to go take out the trash.  She slipped on a piece of ice and fell backwards onto her left wrist.  She states she noticed an immediate pain with a pop.  Since then she has developed swelling, decreased range of motion, and constant pain.  She states she is unable to move her wrist at all.  She denies any head impact.  She denies any bleeding.  She denies any sensation loss at this time, but does report that initially she did have sensation loss.  On my exam there is significant edema.  There is also bony tenderness.  I am worried about a fracture.  Will have patient get an urgent x-ray.  Patient also informed about urgent [facility] at [facility] as she is already established with [facility].  Plan: - Follow-up left wrist x-ray - Patient encouraged to use Tylenol  500 mg every 6 hours as needed, as well as ibuprofen  200 mg every 6 hours as needed - Order for wrist splint - Discussed orthopedic urgent care with patient and given information  Patient discussed with Dr. Trudy Libby Blanch,  DO Internal Medicine Resident PGY-3     [1]  Current Outpatient Medications:    diclofenac  Sodium (VOLTAREN ) 1 % GEL, Apply 4 g topically 4 (four) times daily., Disp: 50 g, Rfl: 2   atorvastatin  (LIPITOR) 20 MG tablet, Take 1 tablet (20 mg total) by mouth daily., Disp: 90 tablet, Rfl: 3   Blood Glucose Monitoring Suppl (ACCU-CHEK GUIDE ME) w/Device KIT, Use to check blood sugar 3 times a day., Disp: 1 kit, Rfl: 1   celecoxib  (CELEBREX ) 100 MG capsule, Take 1 capsule (100 mg total) by mouth daily as needed for moderate pain (pain score 4-6)., Disp: 30 capsule, Rfl: 3   cetirizine  (ZYRTEC ) 10 MG tablet, TAKE 1 TABLET BY MOUTH EVERY DAY, Disp: 30 tablet, Rfl: 11    cyclobenzaprine  (FLEXERIL ) 10 MG tablet, Take 1 tablet (10 mg total) by mouth 3 (three) times daily as needed for muscle spasms., Disp: 90 tablet, Rfl: 3   diclofenac  Sodium (VOLTAREN ) 1 % GEL, Apply 4 grams topically 4 (four) times daily., Disp: 300 g, Rfl: 1   empagliflozin  (JARDIANCE ) 25 MG TABS tablet, Take 1 tablet (25 mg total) by mouth daily., Disp: 90 tablet, Rfl: 3   gabapentin  (NEURONTIN ) 400 MG capsule, Take 1 capsule (400 mg total) by mouth at bedtime., Disp: 90 capsule, Rfl: 3   glucose blood (ACCU-CHEK GUIDE TEST) test strip, Use to check blood sugar levels up to 3 (three) times daily., Disp: 270 each, Rfl: 3   glycopyrrolate  (ROBINUL ) 1 MG tablet, Take 1 tablet (1 mg total) by mouth at bedtime., Disp: 90 tablet, Rfl: 3   insulin  glargine (LANTUS  SOLOSTAR) 100 UNIT/ML Solostar Pen, Inject 38 Units into the skin daily., Disp: 30 mL, Rfl: 1   Insulin  Pen Needle (PEN NEEDLES) 32G X 4 MM MISC, Use with Victoza  and Lantus , Disp: 400 each, Rfl: 0   liraglutide  (VICTOZA ) 18 MG/3ML SOPN, Inject 1.8 mg into the skin daily., Disp: 9 mL, Rfl: 3   meclizine  (ANTIVERT ) 25 MG tablet, Take 1 tablet (25 mg total) by mouth 3 (three) times daily as needed for dizziness., Disp: 90 tablet, Rfl: 0   naloxone  (NARCAN ) 0.4 MG/ML injection, Inject 1 mL (0.4 mg total) into the muscle as needed (for opioid overdose or respiratory depression)., Disp: 1 mL, Rfl: 0   oxybutynin  (DITROPAN -XL) 10 MG 24 hr tablet, Take 1 tablet (10 mg total) by mouth daily., Disp: 90 tablet, Rfl: 0   Oxycodone  HCl 10 MG TABS, Take 1 tablet (10 mg total) by mouth every 6 (six) hours as needed., Disp: 120 tablet, Rfl: 0   pantoprazole  (PROTONIX ) 40 MG tablet, TAKE 1 TABLET BY MOUTH EVERY DAY, Disp: 90 tablet, Rfl: 1   Accu-Chek Softclix Lancets lancets, Use as directed to check blood sugar levels three times a day., Disp: 300 each, Rfl: 1  "

## 2024-08-12 NOTE — Telephone Encounter (Signed)
 FYI Only or Action Required?: FYI only for provider: appointment scheduled on 1/28.  Patient was last seen in primary care on 04/22/2024 by Tobie Gaines, DO.  Called Nurse Triage reporting Wrist Injury.  Symptoms began yesterday.  Interventions attempted: Prescription medications: Oxycodone , Ice/heat application, and Other: Wrapped up and elevated.  Symptoms are: gradually worsening.  Triage Disposition: See Physician Within 24 Hours  Patient/caregiver understands and will follow disposition?: Yes    Fell on ice yesterday, landed on left wrist. Now with 5-6/10 sharp pain and mild swelling. Worse when moving wrist side to side. No cuts or bleeding. Fingers were a little tingly yesterday, improving. No numbness. Scheduled appt with different provider at home office today d/t no PCP availability within timeframe. Advised UC or ED for worsening symptoms.     Message from Alfonso ORN sent at 08/12/2024 10:07 AM EST  Summary: patient fell yesterday on ice with injuries   Reason for Triage: patient fell yesterday on ice left wrist with pain and swelling , have some tingling in fingers         Reason for Disposition  [1] MODERATE pain (e.g., interferes with normal activities) AND [2] high-risk adult (e.g., age > 60 years, osteoporosis, chronic steroid use)  Answer Assessment - Initial Assessment Questions 1. MECHANISM: How did the injury happen?     Fell on some ice  2. ONSET: When did the injury happen? (e.g., minutes or hours ago)      Yesterday   3. APPEARANCE of INJURY: What does the injury look like?      Mild swelling  4. SEVERITY: Can you use your wrist normally? Can you move your wrist back and forth? Can you hold something in your hand?     Able to move up and down, not so much side to side  5. SIZE: For cuts, bruises, or swelling, ask: How large is it? (e.g., inches or centimeters; entire wrist)      Mild swelling  6. PAIN: How bad is the pain? (Scale 0-10;  or none, mild, moderate, severe)     5-6/10 sharp pain after taking pain pill  7. TETANUS: For any breaks in the skin, ask: When was your last tetanus booster?     N/a  8. OTHER SYMPTOMS: Do you have any other symptoms?      Some tingling that's improving  Protocols used: Wrist Injury-A-AH

## 2024-08-13 ENCOUNTER — Encounter (HOSPITAL_BASED_OUTPATIENT_CLINIC_OR_DEPARTMENT_OTHER): Payer: Self-pay | Admitting: Emergency Medicine

## 2024-08-13 ENCOUNTER — Emergency Department (HOSPITAL_BASED_OUTPATIENT_CLINIC_OR_DEPARTMENT_OTHER)
Admission: EM | Admit: 2024-08-13 | Discharge: 2024-08-13 | Disposition: A | Discharge status: Home / Self Care | Attending: Emergency Medicine | Admitting: Emergency Medicine

## 2024-08-13 ENCOUNTER — Emergency Department (HOSPITAL_BASED_OUTPATIENT_CLINIC_OR_DEPARTMENT_OTHER): Admitting: Radiology

## 2024-08-13 DIAGNOSIS — S52522A Torus fracture of lower end of left radius, initial encounter for closed fracture: Secondary | ICD-10-CM | POA: Diagnosis not present

## 2024-08-13 DIAGNOSIS — M25532 Pain in left wrist: Secondary | ICD-10-CM | POA: Insufficient documentation

## 2024-08-13 DIAGNOSIS — Z79899 Other long term (current) drug therapy: Secondary | ICD-10-CM | POA: Insufficient documentation

## 2024-08-13 DIAGNOSIS — S59912A Unspecified injury of left forearm, initial encounter: Secondary | ICD-10-CM | POA: Diagnosis present

## 2024-08-13 DIAGNOSIS — Z794 Long term (current) use of insulin: Secondary | ICD-10-CM | POA: Insufficient documentation

## 2024-08-13 DIAGNOSIS — W009XXA Unspecified fall due to ice and snow, initial encounter: Secondary | ICD-10-CM | POA: Diagnosis not present

## 2024-08-13 NOTE — ED Notes (Signed)

## 2024-08-13 NOTE — Discharge Instructions (Addendum)
 You have a radial fracture in the left wrist.  You are given a splint for this.  Continue taking your home pain medication.  If your home pain medication does not have any Tylenol  in it you can take up to 1000 mg every 6 hours for a total of no more than 4000 mg in a 24-hour period.  If you are taking Celebrex  do not take any other anti-inflammatory medications such as ibuprofen , Aleve .

## 2024-08-13 NOTE — ED Provider Notes (Signed)
 " [place] EMERGENCY DEPARTMENT AT [facility] Provider Note   CSN: [id number] Arrival date & time: 08/13/24  1402     Patient presents with: [place]   [name] is a 64 y.o. female.   64 year old female presents today for concern of a fall that occurred on Tuesday after she slipped on ice.  Denies any head injury.  Not on any blood thinning medication.  She states she did hear a pop in her wrist when it happened as well as when her husband was wrapping her wrist.  She states she waited to come in due to the road conditions.  No other complaints.  She takes chronic oxycodone .  She also takes Celebrex  routinely for her chronic pain.  She states she took a few doses of ibuprofen  but was instructed not to combine ibuprofen  with Celebrex .  She is a patient of [facility] internal medicine teaching service.  The history is provided by the patient. No language interpreter was used.       Prior to Admission medications  Medication Sig Start Date End Date Taking? Authorizing Provider  atorvastatin  (LIPITOR) 20 MG tablet Take 1 tablet (20 mg total) by mouth daily. 10/01/23   Nooruddin, Saad, MD  Blood Glucose Monitoring Suppl (ACCU-CHEK GUIDE ME) w/Device KIT Use to check blood sugar 3 times a day. 02/20/24   Karna Fellows, MD  celecoxib  (CELEBREX ) 100 MG capsule Take 1 capsule (100 mg total) by mouth daily as needed for moderate pain (pain score 4-6). 03/10/24   Karna Fellows, MD  cetirizine  (ZYRTEC ) 10 MG tablet TAKE 1 TABLET BY MOUTH EVERY DAY 10/22/22   Karna Fellows, MD  cyclobenzaprine  (FLEXERIL ) 10 MG tablet Take 1 tablet (10 mg total) by mouth 3 (three) times daily as needed for muscle spasms. 05/04/24   Karna Fellows, MD  diclofenac  Sodium (VOLTAREN ) 1 % GEL Apply 4 grams topically 4 (four) times daily. 06/16/24   Karna Fellows, MD  diclofenac  Sodium (VOLTAREN ) 1 % GEL Apply 4 grams topically 4 (four) times daily. 08/12/24   Tobie Gaines, DO  empagliflozin  (JARDIANCE ) 25 MG TABS tablet Take 1 tablet  (25 mg total) by mouth daily. 08/03/24   Lovie Clarity, MD  gabapentin  (NEURONTIN ) 400 MG capsule Take 1 capsule (400 mg total) by mouth at bedtime. 10/01/23   Nooruddin, Saad, MD  glucose blood (ACCU-CHEK GUIDE TEST) test strip Use to check blood sugar levels up to 3 (three) times daily. 02/20/24   Karna Fellows, MD  glycopyrrolate  (ROBINUL ) 1 MG tablet Take 1 tablet (1 mg total) by mouth at bedtime. 10/01/23   Nooruddin, Saad, MD  insulin  glargine (LANTUS  SOLOSTAR) 100 UNIT/ML Solostar Pen Inject 38 Units into the skin daily. 07/21/24   Lovie Clarity, MD  Insulin  Pen Needle (PEN NEEDLES) 32G X 4 MM MISC Use with Victoza  and Lantus  06/16/24   Karna Fellows, MD  liraglutide  (VICTOZA ) 18 MG/3ML SOPN Inject 1.8 mg into the skin daily. 03/10/24   Karna Fellows, MD  meclizine  (ANTIVERT ) 25 MG tablet Take 1 tablet (25 mg total) by mouth 3 (three) times daily as needed for dizziness. 07/01/24   Shawn Sick, MD  naloxone  (NARCAN ) 0.4 MG/ML injection Inject 1 mL (0.4 mg total) into the muscle as needed (for opioid overdose or respiratory depression). 03/12/23   Karna Fellows, MD  oxybutynin  (DITROPAN -XL) 10 MG 24 hr tablet Take 1 tablet (10 mg total) by mouth daily. 07/01/24   Shawn Sick, MD  Oxycodone  HCl 10 MG TABS Take 1 tablet (10  mg total) by mouth every 6 (six) hours as needed. 08/03/24   Lovie Clarity, MD  pantoprazole  (PROTONIX ) 40 MG tablet TAKE 1 TABLET BY MOUTH EVERY DAY 10/22/22   Karna Fellows, MD  Accu-Chek Softclix Lancets lancets Use as directed to check blood sugar levels three times a day. 10/01/23   Nooruddin, Saad, MD    Allergies: Byetta  10 mcg pen [exenatide ], Metformin , Mango butter, and Ofloxacin    Review of Systems  Constitutional:  Negative for chills and fever.  Musculoskeletal:  Positive for arthralgias. Negative for joint swelling.  Neurological:  Negative for syncope.  All other systems reviewed and are negative.   Updated Vital Signs BP 137/85 (BP Location: Right Arm)   Pulse 78   Temp 97.6  F (36.4 C)   Resp 18   LMP 07/04/2017   SpO2 96%   Physical Exam Vitals and nursing note reviewed.  Constitutional:      General: She is not in acute distress.    Appearance: Normal appearance. She is not ill-appearing.  HENT:     Head: Normocephalic and atraumatic.     Nose: Nose normal.  Eyes:     Conjunctiva/sclera: Conjunctivae normal.  Cardiovascular:     Rate and Rhythm: Normal rate and regular rhythm.  Pulmonary:     Effort: Pulmonary effort is normal. No respiratory distress.  Musculoskeletal:        General: No deformity.     Comments: Without obvious deformity or swelling to the left wrist.  Tenderness to palpation over the left wrist.  Compartments in the left arm are soft.  No tenderness over the left elbow.  Neurovascularly intact.  Skin:    Findings: No rash.  Neurological:     Mental Status: She is alert.     (all labs ordered are listed, but only abnormal results are displayed) Labs Reviewed - No data to display  EKG: None  Radiology: DG Wrist Complete Left Result Date: 08/13/2024 CLINICAL DATA:  Trauma to the left wrist. EXAM: LEFT WRIST - COMPLETE 3+ VIEW COMPARISON:  Left wrist radiograph dated 01/08/2022. FINDINGS: There is faint sclerotic band across the distal radius suspicious for a nondisplaced fracture. Evaluation for fracture however is very limited due to advanced osteopenia. No dislocation. The soft tissues are unremarkable. IMPRESSION: Nondisplaced fracture of the distal radius. Electronically Signed   By: Vanetta Chou M.D.   On: 08/13/2024 15:42     Procedures   Medications Ordered in the ED - No data to display                                  Medical Decision Making Amount and/or Complexity of Data Reviewed Radiology: ordered.   64 year old female presents after a fall that occurred on Tuesday.  Complains of left wrist pain.  No other injuries.  X-ray shows nondisplaced distal radial fracture.  X-ray personally reviewed by me  and agree with radiology interpretation.  Exam reassuring.  Will place in radial gutter splint.  Hand follow-up given.  Patient is in agreement with plan.  Discussed she can take Tylenol  in addition to her current regiment if her oxycodone  does not have any Tylenol  combined with it.  She believes it does not.  I asked her to confirm.  If it does not she can take up to 1000 mg every 6 hours.  If it does then she can take a maximum of 500 mg in  addition but not exceeding 4000 mg in a 24-hour period. Discharged in stable condition Reviewed return precautions discussed. Patient is right-hand dominant.  Final diagnoses:  Traumatic closed nondisplaced torus fracture of distal radial metaphysis, left, initial encounter    ED Discharge Orders     None          Hildegard Loge, PA-C 08/13/24 1728    Doretha Folks, MD 08/13/24 2341  "

## 2024-08-13 NOTE — ED Triage Notes (Addendum)
 Fall on ice on Tuesday Pain in left wrist Some swelling  Patient has wrap on in, comfort for patient  No hit head denies loc

## 2024-08-14 ENCOUNTER — Ambulatory Visit: Payer: Self-pay

## 2024-08-19 NOTE — Progress Notes (Signed)
 Internal Medicine Clinic Attending  Case discussed with the resident at the time of the visit.  We reviewed the resident's history and exam and pertinent patient test results.  I agree with the assessment, diagnosis, and plan of care documented in the resident's note.

## 2024-08-20 ENCOUNTER — Encounter: Payer: Self-pay | Admitting: Student

## 2024-08-20 ENCOUNTER — Other Ambulatory Visit (HOSPITAL_COMMUNITY): Payer: Self-pay

## 2024-08-20 ENCOUNTER — Other Ambulatory Visit: Payer: Self-pay

## 2024-08-20 ENCOUNTER — Other Ambulatory Visit: Payer: Self-pay | Admitting: Internal Medicine

## 2024-08-20 ENCOUNTER — Ambulatory Visit: Admitting: Student

## 2024-08-20 VITALS — BP 128/67 | HR 85 | Temp 97.9°F | Ht 62.0 in | Wt 174.4 lb

## 2024-08-20 DIAGNOSIS — Z79891 Long term (current) use of opiate analgesic: Secondary | ICD-10-CM

## 2024-08-20 DIAGNOSIS — Z13820 Encounter for screening for osteoporosis: Secondary | ICD-10-CM

## 2024-08-20 DIAGNOSIS — S52502A Unspecified fracture of the lower end of left radius, initial encounter for closed fracture: Secondary | ICD-10-CM | POA: Insufficient documentation

## 2024-08-20 DIAGNOSIS — Z1211 Encounter for screening for malignant neoplasm of colon: Secondary | ICD-10-CM

## 2024-08-20 DIAGNOSIS — E785 Hyperlipidemia, unspecified: Secondary | ICD-10-CM

## 2024-08-20 DIAGNOSIS — E1165 Type 2 diabetes mellitus with hyperglycemia: Secondary | ICD-10-CM

## 2024-08-20 DIAGNOSIS — E113399 Type 2 diabetes mellitus with moderate nonproliferative diabetic retinopathy without macular edema, unspecified eye: Secondary | ICD-10-CM

## 2024-08-20 LAB — POCT GLYCOSYLATED HEMOGLOBIN (HGB A1C): HbA1c, POC (controlled diabetic range): 8.3 % — AB (ref 0.0–7.0)

## 2024-08-20 LAB — GLUCOSE, CAPILLARY: Glucose-Capillary: 233 mg/dL — ABNORMAL HIGH (ref 70–99)

## 2024-08-20 MED ORDER — LIRAGLUTIDE 18 MG/3ML ~~LOC~~ SOPN
1.8000 mg | PEN_INJECTOR | Freq: Every day | SUBCUTANEOUS | 3 refills | Status: AC
  Filled 2024-08-20: qty 9, 30d supply, fill #0

## 2024-08-20 MED ORDER — OXYCODONE HCL 5 MG PO TABS
5.0000 mg | ORAL_TABLET | Freq: Every day | ORAL | 0 refills | Status: AC | PRN
  Filled 2024-08-20: qty 10, 10d supply, fill #0

## 2024-08-20 NOTE — Assessment & Plan Note (Deleted)
 Lab Results  Component Value Date   HGBA1C 10.1 (A) 02/18/2024   HGBA1C 10.1 (H) 11/05/2023   HGBA1C 8.8 (A) 06/18/2023    Home medication: Jardiance  25 mg daily, Lantus  38 units daily, Victoza  1.8 mg weekly Reports compliance:  [name] wore the CGM for *** days. The average reading was ***, % time in target was ***, % time below target was ***, and % time above target was. ***.  Denies any symptoms of weakness, polyuria or polydipsia   Urine ACR: <9 [02/18/2024] BMP: Scr 0.66 and GFR >60 [11/03/2023]  Foot exam: due today  Ophathmology visit:   - Changes made:  - Continue *** - RTC in 3 months for repeat A1c

## 2024-08-20 NOTE — Assessment & Plan Note (Signed)
 Fit test ordered for patient today

## 2024-08-20 NOTE — Assessment & Plan Note (Signed)
 Last week diagnosed with left radius fracture.  Patient reports having some breakthrough pain.  Will give patient short course of breakthrough pain opioids.  Patient splint in place.  To follow-up with orthopedics next week.  Plan: - 10 pills of 5 mg oxycodone  as needed for breakthrough pain - Patient to follow-up orthopedic next week

## 2024-08-20 NOTE — Assessment & Plan Note (Deleted)
 Lab Results  Component Value Date   CHOL 139 02/18/2024   HDL 55 02/18/2024   LDLCALC 60 02/18/2024   TRIG 139 02/18/2024   CHOLHDL 2.5 02/18/2024    The 89-bzjm ASCVD risk score (Arnett DK, et al., 2019) is: 5.3%  - Repeat LDL in the next OV - Continue lipitor 20 mg daily

## 2024-08-20 NOTE — Assessment & Plan Note (Deleted)
 Initial fall on 1/27, patient went outside to take out trash, slipped on ice and fell backwards onto her left wrist.  Patient was seen in the clinic on 1/28 and evaluated in the ED 1/29, x-ray of the left wrist showed nondisplaced fracture of distal radius, she was placed on radial gutter splint with follow up with orthopedics.

## 2024-08-20 NOTE — Assessment & Plan Note (Signed)
 No acute concerns at this time.  Continue oxycodone  10 mg every 6 hours as needed.  No concerns for somnolence

## 2024-08-20 NOTE — Assessment & Plan Note (Deleted)
 Cologuard 2023 was negative; due this year.

## 2024-08-20 NOTE — Progress Notes (Signed)
 "  CC: Diabetes follow-up  HPI:  [name] is a 64 y.o. female with a past medical history of diabetes who presents for diabetes follow-up.  Please see assessment and plan for full HPI  Past Medical History:  Diagnosis Date   Adhesive capsulitis of right shoulder    with underlying tendinopathy rotator cuff   Arthritis    Diabetes mellitus    oral tx   Fibromyalgia    GERD (gastroesophageal reflux disease)    Healthcare maintenance 03/01/2011   History of post-sterilization tuboplasty 2000   Pain of left thumb 01/10/2022   Acute pain in the left thumb after fall on an inclined surface on 01/06/22. Pain getting worse and movement decreasing since the injury. Pain located more at base than distal thumb. Patient has been using ice and ibuprofen  and verapamil cream. Home pain meds include oxycodone , celebrex  100-200 mg qd, voltaren  gel,  verapamil cream.      Plantar fasciitis    Right   Shortness of breath    with exertion   Sleep apnea 5 plus yrs   study -pt could not sleep test inconclusive.    Tear of meniscus of left knee    x2   Tear of meniscus of right knee     Current Medications[1]  Review of Systems:    MSK: Patient endorses left wrist pain  Physical Exam:  Vitals:   08/20/24 1353  BP: 128/67  Pulse: 85  Temp: 97.9 F (36.6 C)  TempSrc: Oral  SpO2: 99%  Weight: 174 lb 6.4 oz (79.1 kg)  Height: 5' 2 (1.575 m)   General: Patient is sitting comfortably in the room  Head: Normocephalic, atraumatic  Cardio: Regular rate and rhythm, no murmurs, rubs or gallops Pulmonary: Clear to ausculation bilaterally with no rales, rhonchi, and crackles  Extremities: Left wrist in splint  Assessment & Plan:   Assessment & Plan 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  She endorses that she is on  Lantus  38 units daily she is also on Victoza  1.8 mg daily.  She has not been taking her Jardiance  because she has not picked it up.  She just picked up a couple days ago.  A1c today 8.3.  This is improved from 10.1 about 6 months ago.  Patient defers foot exam today as she will have trouble taking her shoes off.  Patient is up-to-date on her eye exam.  She checks her fasting sugars and usually her ranging from 90-120.  She does not  check postprandial sugars.  Will have patient start checking postprandial sugars to evaluate for mealtime insulin .  I do wonder if her A1c has not fully improved in the setting of her not taking Jardiance .  If patient continues to have elevated A1c, could potentially switch her off of Victoza  to Ozempic or Mounjaro.  Plan: - Continue Victoza  1.8 mg daily - Continue Lantus  30 units daily - Start checking postprandial sugars - Continue Jardiance  25 mg daily - Foot exam deferred today - Eye exam up-to-date - Follow-up BMP - If patient does have elevated postprandial sugars in 1 month, could potentially start mealtime insulin  - If patient's A1c consistently remains high, can switch out Victoza  to Ozempic or Mounjaro Hyperlipidemia, unspecified hyperlipidemia type Past medical history of hyperlipidemia.  Most recent lipid panel showed total cholesterol 139, triglycerides 139, LDL 60.  This was about 6 months  ago.  At goal.  Plan: - Continue Lipitor 20 mg daily Long-term current use of opiate analgesic for osteoarthritis and fibromyalgia No acute concerns at this time.  Continue oxycodone  10 mg every 6 hours as needed.  No concerns for somnolence Nondisplaced fracture of distal end of left radius Last week diagnosed with left radius fracture.  Patient reports having some breakthrough pain.  Will give patient short course of breakthrough pain opioids.  Patient splint in place.  To follow-up with orthopedics next week.  Plan: - 10 pills of 5 mg oxycodone  as needed for  breakthrough pain - Patient to follow-up orthopedic next week Screening for colon cancer Fit test ordered for patient today Screening for osteoporosis DEXA scan ordered for patient today   Patient discussed with Dr. Jeanelle Libby Blanch, DO Internal Medicine Resident PGY-3     [1]  Current Outpatient Medications:    oxyCODONE  (OXY IR/ROXICODONE ) 5 MG immediate release tablet, Take 1 tablet (5 mg total) by mouth daily as needed for severe pain (pain score 7-10)., Disp: 10 tablet, Rfl: 0   atorvastatin  (LIPITOR) 20 MG tablet, Take 1 tablet (20 mg total) by mouth daily., Disp: 90 tablet, Rfl: 3   Blood Glucose Monitoring Suppl (ACCU-CHEK GUIDE ME) w/Device KIT, Use to check blood sugar 3 times a day., Disp: 1 kit, Rfl: 1   celecoxib  (CELEBREX ) 100 MG capsule, Take 1 capsule (100 mg total) by mouth daily as needed for moderate pain (pain score 4-6)., Disp: 30 capsule, Rfl: 3   cetirizine  (ZYRTEC ) 10 MG tablet, TAKE 1 TABLET BY MOUTH EVERY DAY, Disp: 30 tablet, Rfl: 11   cyclobenzaprine  (FLEXERIL ) 10 MG tablet, Take 1 tablet (10 mg total) by mouth 3 (three) times daily as needed for muscle spasms., Disp: 90 tablet, Rfl: 3   diclofenac  Sodium (VOLTAREN ) 1 % GEL, Apply 4 grams topically 4 (four) times daily., Disp: 300 g, Rfl: 1   diclofenac  Sodium (VOLTAREN ) 1 % GEL, Apply 4 grams topically 4 (four) times daily., Disp: 100 g, Rfl: 2   empagliflozin  (JARDIANCE ) 25 MG TABS tablet, Take 1 tablet (25 mg total) by mouth daily., Disp: 90 tablet, Rfl: 3   gabapentin  (NEURONTIN ) 400 MG capsule, Take 1 capsule (400 mg total) by mouth at bedtime., Disp: 90 capsule, Rfl: 3   glucose blood (ACCU-CHEK GUIDE TEST) test strip, Use to check blood sugar levels up to 3 (three) times daily., Disp: 270 each, Rfl: 3   glycopyrrolate  (ROBINUL ) 1 MG tablet, Take 1 tablet (1 mg total) by mouth at bedtime., Disp: 90 tablet, Rfl: 3   insulin  glargine (LANTUS  SOLOSTAR) 100 UNIT/ML Solostar Pen, Inject 38 Units into the  skin daily., Disp: 30 mL, Rfl: 1   Insulin  Pen Needle (PEN NEEDLES) 32G X 4 MM MISC, Use with Victoza  and Lantus , Disp: 400 each, Rfl: 0   liraglutide  (VICTOZA ) 18 MG/3ML SOPN, Inject 1.8 mg into the skin daily., Disp: 9 mL, Rfl: 3   meclizine  (ANTIVERT ) 25 MG tablet, Take 1 tablet (25 mg total) by mouth 3 (three) times daily as needed for dizziness., Disp: 90 tablet, Rfl: 0   naloxone  (NARCAN ) 0.4 MG/ML injection, Inject 1 mL (0.4 mg total) into the muscle as needed (for opioid overdose or respiratory depression)., Disp: 1 mL, Rfl: 0   oxybutynin  (DITROPAN -XL) 10 MG 24 hr tablet, Take 1 tablet (10 mg total) by mouth daily., Disp: 90 tablet, Rfl: 0   Oxycodone  HCl 10 MG TABS, Take 1 tablet (10 mg total) by  mouth every 6 (six) hours as needed., Disp: 120 tablet, Rfl: 0   pantoprazole  (PROTONIX ) 40 MG tablet, TAKE 1 TABLET BY MOUTH EVERY DAY, Disp: 90 tablet, Rfl: 1   Accu-Chek Softclix Lancets lancets, Use as directed to check blood sugar levels three times a day., Disp: 300 each, Rfl: 1  "

## 2024-08-20 NOTE — Assessment & Plan Note (Deleted)
 Home medication consists of Celebrex  100 mg daily as needed, Flexeril  10 mg 3 times daily as needed, gabapentin  400 mg daily at bedtime, oxycodone  10 mg every 6 hours as needed.

## 2024-08-20 NOTE — Assessment & Plan Note (Signed)
 Patient presents for diabetes follow-up.  She endorses that she is on Lantus  38 units daily she is also on Victoza  1.8 mg daily.  She has not been taking her Jardiance  because she has not picked it up.  She just picked up a couple days ago.  A1c today 8.3.  This is improved from 10.1 about 6 months ago.  Patient defers foot exam today as she will have trouble taking her shoes off.  Patient is up-to-date on her eye exam.  She checks her fasting sugars and usually her ranging from 90-120.  She does not  check postprandial sugars.  Will have patient start checking postprandial sugars to evaluate for mealtime insulin .  I do wonder if her A1c has not fully improved in the setting of her not taking Jardiance .  If patient continues to have elevated A1c, could potentially switch her off of Victoza  to Ozempic or Mounjaro.  Plan: - Continue Victoza  1.8 mg daily - Continue Lantus  30 units daily - Start checking postprandial sugars - Continue Jardiance  25 mg daily - Foot exam deferred today - Eye exam up-to-date - Follow-up BMP - If patient does have elevated postprandial sugars in 1 month, could potentially start mealtime insulin  - If patient's A1c consistently remains high, can switch out Victoza  to Ozempic or Mounjaro

## 2024-08-20 NOTE — Patient Instructions (Signed)
 Sherline Moss, Thank you for allowing me to take part in your care today.  Here are your instructions.  1.  Continue taking your Lantus , Victoza , Jardiance .  Check your sugars after meals.  Bring the log with you next time.  2.  Come back in 1 month to see how this is doing.  3.  I will send you in some oxycodone  for breakthrough pain to get to orthopedic doctor.  4.  If you would like we can order a DEXA scan to evaluate for osteoporosis given your fall.   PLEASE BRING YOUR MEDICATIONS TO EVERY APPOINTMENT  Thank you, Dr. Tobie  If you have any other questions please contact the internal medicine clinic at [phone] If it is after hours, please call the [facility] at [phone] and then ask the person who picks up for the resident on call.

## 2024-08-20 NOTE — Progress Notes (Signed)
 Internal Medicine Clinic Attending  Case discussed with the resident at the time of the visit.  We reviewed the resident's history and exam and pertinent patient test results.  I agree with the assessment, diagnosis, and plan of care documented in the resident's note.

## 2024-08-20 NOTE — Progress Notes (Deleted)
 "  Established Patient Office Visit  Subjective   Patient ID: [name], female    DOB: 12-Nov-1960  Age: 64 y.o. MRN: [id number]  No chief complaint on file.   [name] is a 64 y.o. female past medical history of diabetes, chronic pain, hyperlipidemia, fibromyalgia presents today for follow-up on chronic conditions.  Last office visit 08/12/2024 for fall > patient was seen in the ED on 1/29, had x-ray of left wrist that showed nondisplaced fracture of the distal radius, radial gutter splint was applied.   Review of Systems:  As per assessment and Plan   Objective:     There were no vitals filed for this visit. ***  Physical Exam General: Sitting in chair, no acute distress Cardiovascular: Regular rate Pulmonary: Breathing comfortably Abdomen: Soft, nontender, nondistended MSK: No lower extremity edema bilaterally  {Labs (Optional):23779}  The 10-year ASCVD risk score (Arnett DK, et al., 2019) is: 5.3%    Assessment & Plan:   Patient {GC/GE:3044014::discussed with,seen with} Dr. 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 The average reading was ***, % time in target was ***, % time below target was ***, and % time above target was. ***.  Denies any symptoms of weakness, polyuria or polydipsia   Urine ACR: <9 [02/18/2024] BMP: Scr 0.66 and GFR >60 [11/03/2023]  Foot exam: due today  Ophathmology visit:   - Changes made:  - Continue *** - RTC in  3 months for repeat A1c  Hyperlipidemia, unspecified hyperlipidemia type Lab Results  Component Value Date   CHOL 139 02/18/2024   HDL 55 02/18/2024   LDLCALC 60 02/18/2024   TRIG 139 02/18/2024   CHOLHDL 2.5 02/18/2024    The 89-bzjm ASCVD risk score (Arnett DK, et al., 2019) is: 5.3%  - Repeat LDL in the next OV - Continue lipitor 20 mg daily   Long-term current use of opiate analgesic for osteoarthritis and fibromyalgia Home medication consists of Celebrex  100 mg daily as needed, Flexeril  10 mg 3 times daily as needed, gabapentin  400 mg daily at bedtime, oxycodone  10 mg every 6 hours as needed. Nondisplaced fracture of distal end of left radius Initial fall on 1/27, patient went outside to take out trash, slipped on ice and fell backwards onto her left wrist.  Patient was seen in the clinic on 1/28 and evaluated in the ED 1/29, x-ray of the left wrist showed nondisplaced fracture of distal radius, she was placed on radial gutter splint with follow up with orthopedics.  Screening for colon cancer Cologuard 2023 was negative; due this year.     Problem List Items Addressed This Visit       Endocrine   DM2 (diabetes mellitus, type 2) (HCC) - Primary (Chronic)   Lab Results  Component Value Date   HGBA1C 10.1 (A) 02/18/2024   HGBA1C 10.1 (H) 11/05/2023   HGBA1C 8.8 (A) 06/18/2023    Home medication: Jardiance  25 mg daily, Lantus  38 units daily, Victoza  1.8 mg daily, Reports compliance:  [name] wore the CGM for *** days. The average reading was ***, % time in target  was ***, % time below target was ***, and % time above target was. ***.  Denies any symptoms of weakness, polyuria or polydipsia   Urine ACR: <9 [02/18/2024] BMP: Scr 0.66 and GFR >60 [11/03/2023]  Foot exam: due today  Ophathmology visit:   - Changes made:  - Continue *** - RTC in 3 months         Musculoskeletal and Integument   Nondisplaced fracture of distal end of left radius   Initial fall  on 1/27, patient went outside to take out trash, slipped on ice and fell backwards onto her left wrist.  Patient was seen in the clinic on 1/28 and evaluated in the ED 1/29, x-ray of the left wrist showed nondisplaced fracture of distal radius, she was placed on radial gutter splint.        Other   Hyperlipidemia (Chronic)   Lab Results  Component Value Date   CHOL 139 02/18/2024   HDL 55 02/18/2024   LDLCALC 60 02/18/2024   TRIG 139 02/18/2024   CHOLHDL 2.5 02/18/2024    The 89-bzjm ASCVD risk score (Arnett DK, et al., 2019) is: 5.3%  - Repeat LDL in the next OV - Continue lipitor 20 mg daily         Long-term current use of opiate analgesic for osteoarthritis and fibromyalgia (Chronic)   Home medication consists of Celebrex  100 mg daily as needed, Flexeril  10 mg 3 times daily as needed, gabapentin  400 mg daily at bedtime, oxycodone  10 mg every 6 hours as needed.       No follow-ups on file.    Toma Edwards, DO "

## 2024-08-20 NOTE — Assessment & Plan Note (Signed)
 Past medical history of hyperlipidemia.  Most recent lipid panel showed total cholesterol 139, triglycerides 139, LDL 60.  This was about 6 months ago.  At goal.  Plan: - Continue Lipitor 20 mg daily

## 2024-08-21 ENCOUNTER — Ambulatory Visit: Payer: Self-pay | Admitting: Student

## 2024-08-21 LAB — BASIC METABOLIC PANEL WITH GFR
BUN/Creatinine Ratio: 20 (ref 12–28)
BUN: 15 mg/dL (ref 8–27)
CO2: 19 mmol/L — ABNORMAL LOW (ref 20–29)
Calcium: 9.9 mg/dL (ref 8.7–10.3)
Chloride: 103 mmol/L (ref 96–106)
Creatinine, Ser: 0.74 mg/dL (ref 0.57–1.00)
Glucose: 194 mg/dL — ABNORMAL HIGH (ref 70–99)
Potassium: 4.1 mmol/L (ref 3.5–5.2)
Sodium: 141 mmol/L (ref 134–144)
eGFR: 91 mL/min/{1.73_m2}

## 2024-08-31 ENCOUNTER — Ambulatory Visit: Admitting: Neurology

## 2024-09-17 ENCOUNTER — Ambulatory Visit: Payer: Self-pay | Admitting: Student
